# Patient Record
Sex: Female | Born: 1965
Health system: Southern US, Community
[De-identification: ages and names within clinical notes are randomized; demographics above are authoritative.]

## PROBLEM LIST (undated history)

## (undated) DIAGNOSIS — R0600 Dyspnea, unspecified: Secondary | ICD-10-CM

## (undated) DIAGNOSIS — I509 Heart failure, unspecified: Secondary | ICD-10-CM

## (undated) DIAGNOSIS — I82409 Acute embolism and thrombosis of unspecified deep veins of unspecified lower extremity: Secondary | ICD-10-CM

## (undated) DIAGNOSIS — E669 Obesity, unspecified: Secondary | ICD-10-CM

## (undated) DIAGNOSIS — D649 Anemia, unspecified: Secondary | ICD-10-CM

## (undated) DIAGNOSIS — E785 Hyperlipidemia, unspecified: Secondary | ICD-10-CM

## (undated) DIAGNOSIS — R5383 Other fatigue: Secondary | ICD-10-CM

## (undated) DIAGNOSIS — R0601 Orthopnea: Secondary | ICD-10-CM

## (undated) DIAGNOSIS — E119 Type 2 diabetes mellitus without complications: Secondary | ICD-10-CM

## (undated) DIAGNOSIS — R0609 Other forms of dyspnea: Secondary | ICD-10-CM

## (undated) DIAGNOSIS — N920 Excessive and frequent menstruation with regular cycle: Secondary | ICD-10-CM

## (undated) DIAGNOSIS — I2510 Atherosclerotic heart disease of native coronary artery without angina pectoris: Secondary | ICD-10-CM

## (undated) DIAGNOSIS — I214 Non-ST elevation (NSTEMI) myocardial infarction: Secondary | ICD-10-CM

## (undated) DIAGNOSIS — I10 Essential (primary) hypertension: Secondary | ICD-10-CM

## (undated) HISTORY — DX: Essential (primary) hypertension: I10

## (undated) HISTORY — DX: Other fatigue: R53.83

## (undated) HISTORY — DX: Other forms of dyspnea: R06.09

## (undated) HISTORY — DX: Dyspnea, unspecified: R06.00

## (undated) HISTORY — PX: CORONARY ANGIOPLASTY WITH STENT PLACEMENT: SHX49

## (undated) HISTORY — DX: Heart failure, unspecified: I50.9

## (undated) HISTORY — DX: Orthopnea: R06.01

## (undated) HISTORY — DX: Acute embolism and thrombosis of unspecified deep veins of unspecified lower extremity: I82.409

## (undated) HISTORY — PX: OVARIAN CYST REMOVAL: SHX89

---

## 2004-10-19 HISTORY — PX: TUBAL LIGATION: SHX77

## 2006-06-05 ENCOUNTER — Emergency Department (HOSPITAL_COMMUNITY): Admission: EM | Admit: 2006-06-05 | Discharge: 2006-06-06 | Payer: Self-pay | Admitting: Emergency Medicine

## 2008-05-15 ENCOUNTER — Emergency Department (HOSPITAL_COMMUNITY): Admission: EM | Admit: 2008-05-15 | Discharge: 2008-05-15 | Payer: Self-pay | Admitting: Emergency Medicine

## 2009-01-08 ENCOUNTER — Emergency Department (HOSPITAL_COMMUNITY): Admission: EM | Admit: 2009-01-08 | Discharge: 2009-01-09 | Payer: Self-pay | Admitting: Emergency Medicine

## 2009-05-31 ENCOUNTER — Emergency Department (HOSPITAL_COMMUNITY): Admission: EM | Admit: 2009-05-31 | Discharge: 2009-06-01 | Payer: Self-pay | Admitting: Emergency Medicine

## 2009-08-09 ENCOUNTER — Observation Stay (HOSPITAL_COMMUNITY): Admission: EM | Admit: 2009-08-09 | Discharge: 2009-08-11 | Payer: Self-pay | Admitting: Emergency Medicine

## 2010-06-09 ENCOUNTER — Emergency Department (HOSPITAL_COMMUNITY): Admission: EM | Admit: 2010-06-09 | Discharge: 2010-06-09 | Payer: Self-pay | Admitting: Family Medicine

## 2010-06-16 ENCOUNTER — Emergency Department (HOSPITAL_COMMUNITY): Admission: EM | Admit: 2010-06-16 | Discharge: 2010-06-17 | Payer: Self-pay | Admitting: Emergency Medicine

## 2010-08-29 ENCOUNTER — Emergency Department (HOSPITAL_COMMUNITY): Admission: EM | Admit: 2010-08-29 | Discharge: 2010-08-30 | Payer: Self-pay | Admitting: Emergency Medicine

## 2010-12-10 ENCOUNTER — Inpatient Hospital Stay (INDEPENDENT_AMBULATORY_CARE_PROVIDER_SITE_OTHER)
Admission: RE | Admit: 2010-12-10 | Discharge: 2010-12-10 | Disposition: A | Discharge status: Home / Self Care | Payer: Self-pay | Source: Ambulatory Visit | Attending: Family Medicine | Admitting: Family Medicine

## 2010-12-10 DIAGNOSIS — N3090 Cystitis, unspecified without hematuria: Secondary | ICD-10-CM

## 2010-12-10 DIAGNOSIS — I10 Essential (primary) hypertension: Secondary | ICD-10-CM

## 2010-12-10 DIAGNOSIS — R109 Unspecified abdominal pain: Secondary | ICD-10-CM

## 2010-12-10 LAB — POCT URINALYSIS DIPSTICK
Nitrite: NEGATIVE
Protein, ur: NEGATIVE mg/dL
pH: 7 (ref 5.0–8.0)

## 2010-12-11 LAB — GC/CHLAMYDIA PROBE AMP, GENITAL: Chlamydia, DNA Probe: NEGATIVE

## 2010-12-16 ENCOUNTER — Emergency Department (HOSPITAL_COMMUNITY): Payer: Self-pay

## 2010-12-16 ENCOUNTER — Emergency Department (HOSPITAL_COMMUNITY)
Admission: EM | Admit: 2010-12-16 | Discharge: 2010-12-16 | Disposition: A | Discharge status: Home / Self Care | Payer: Self-pay | Attending: Emergency Medicine | Admitting: Emergency Medicine

## 2010-12-16 DIAGNOSIS — I10 Essential (primary) hypertension: Secondary | ICD-10-CM | POA: Insufficient documentation

## 2010-12-16 DIAGNOSIS — W108XXA Fall (on) (from) other stairs and steps, initial encounter: Secondary | ICD-10-CM | POA: Insufficient documentation

## 2010-12-16 DIAGNOSIS — S7010XA Contusion of unspecified thigh, initial encounter: Secondary | ICD-10-CM | POA: Insufficient documentation

## 2010-12-16 DIAGNOSIS — E669 Obesity, unspecified: Secondary | ICD-10-CM | POA: Insufficient documentation

## 2010-12-16 DIAGNOSIS — M79609 Pain in unspecified limb: Secondary | ICD-10-CM | POA: Insufficient documentation

## 2010-12-30 LAB — DIFFERENTIAL
Basophils Relative: 0 % (ref 0–1)
Lymphs Abs: 0.9 10*3/uL (ref 0.7–4.0)
Monocytes Relative: 10 % (ref 3–12)
Neutro Abs: 3.3 10*3/uL (ref 1.7–7.7)
Neutrophils Relative %: 69 % (ref 43–77)

## 2010-12-30 LAB — POCT I-STAT, CHEM 8
Chloride: 106 mEq/L (ref 96–112)
Glucose, Bld: 167 mg/dL — ABNORMAL HIGH (ref 70–99)
HCT: 31 % — ABNORMAL LOW (ref 36.0–46.0)
Potassium: 3.7 mEq/L (ref 3.5–5.1)

## 2010-12-30 LAB — URINALYSIS, ROUTINE W REFLEX MICROSCOPIC
Nitrite: NEGATIVE
Specific Gravity, Urine: 1.011 (ref 1.005–1.030)
pH: 7 (ref 5.0–8.0)

## 2010-12-30 LAB — WET PREP, GENITAL: Clue Cells Wet Prep HPF POC: NONE SEEN

## 2010-12-30 LAB — CBC
Hemoglobin: 9.5 g/dL — ABNORMAL LOW (ref 12.0–15.0)
MCHC: 32.5 g/dL (ref 30.0–36.0)
RBC: 4.26 MIL/uL (ref 3.87–5.11)

## 2010-12-30 LAB — GC/CHLAMYDIA PROBE AMP, GENITAL: Chlamydia, DNA Probe: NEGATIVE

## 2011-01-02 LAB — POCT PREGNANCY, URINE: Preg Test, Ur: NEGATIVE

## 2011-01-02 LAB — POCT I-STAT, CHEM 8
Calcium, Ion: 1.15 mmol/L (ref 1.12–1.32)
Chloride: 107 mEq/L (ref 96–112)
HCT: 34 % — ABNORMAL LOW (ref 36.0–46.0)
Hemoglobin: 11.6 g/dL — ABNORMAL LOW (ref 12.0–15.0)
TCO2: 25 mmol/L (ref 0–100)

## 2011-01-02 LAB — POCT URINALYSIS DIPSTICK
Bilirubin Urine: NEGATIVE
Nitrite: NEGATIVE
Urobilinogen, UA: 0.2 mg/dL (ref 0.0–1.0)
pH: 6 (ref 5.0–8.0)

## 2011-01-02 LAB — URINE CULTURE: Culture  Setup Time: 201108221208

## 2011-01-22 LAB — CBC
HCT: 27.4 % — ABNORMAL LOW (ref 36.0–46.0)
HCT: 30.1 % — ABNORMAL LOW (ref 36.0–46.0)
Hemoglobin: 10 g/dL — ABNORMAL LOW (ref 12.0–15.0)
MCHC: 33.1 g/dL (ref 30.0–36.0)
MCV: 73.6 fL — ABNORMAL LOW (ref 78.0–100.0)
MCV: 73.8 fL — ABNORMAL LOW (ref 78.0–100.0)
Platelets: 230 10*3/uL (ref 150–400)
Platelets: 251 10*3/uL (ref 150–400)
RBC: 3.73 MIL/uL — ABNORMAL LOW (ref 3.87–5.11)
RBC: 4.08 MIL/uL (ref 3.87–5.11)
RDW: 15 % (ref 11.5–15.5)
RDW: 15.6 % — ABNORMAL HIGH (ref 11.5–15.5)
WBC: 12.4 10*3/uL — ABNORMAL HIGH (ref 4.0–10.5)
WBC: 6.2 10*3/uL (ref 4.0–10.5)

## 2011-01-22 LAB — COMPREHENSIVE METABOLIC PANEL
ALT: 13 U/L (ref 0–35)
AST: 19 U/L (ref 0–37)
Albumin: 3.4 g/dL — ABNORMAL LOW (ref 3.5–5.2)
Alkaline Phosphatase: 80 U/L (ref 39–117)
BUN: 12 mg/dL (ref 6–23)
BUN: 14 mg/dL (ref 6–23)
CO2: 25 mEq/L (ref 19–32)
Calcium: 8.4 mg/dL (ref 8.4–10.5)
Chloride: 101 mEq/L (ref 96–112)
Creatinine, Ser: 1.01 mg/dL (ref 0.4–1.2)
GFR calc Af Amer: >60 mL/min (ref >60)
GFR calc non Af Amer: >60 mL/min (ref >60)
Glucose, Bld: 172 mg/dL — ABNORMAL HIGH (ref 70–99)
Potassium: 4.2 mEq/L (ref 3.5–5.1)
Sodium: 135 mEq/L (ref 135–145)
Total Bilirubin: 0.5 mg/dL (ref 0.3–1.2)
Total Protein: 7 g/dL (ref 6.0–8.3)
Total Protein: 7.8 g/dL (ref 6.0–8.3)

## 2011-01-22 LAB — BASIC METABOLIC PANEL
Chloride: 102 mEq/L (ref 96–112)
GFR calc Af Amer: >60 mL/min (ref >60)
GFR calc non Af Amer: >60 mL/min (ref >60)
Potassium: 4.1 mEq/L (ref 3.5–5.1)
Sodium: 137 mEq/L (ref 135–145)

## 2011-01-22 LAB — GLUCOSE, CAPILLARY
Glucose-Capillary: 100 mg/dL — ABNORMAL HIGH (ref 70–99)
Glucose-Capillary: 111 mg/dL — ABNORMAL HIGH (ref 70–99)
Glucose-Capillary: 120 mg/dL — ABNORMAL HIGH (ref 70–99)
Glucose-Capillary: 127 mg/dL — ABNORMAL HIGH (ref 70–99)
Glucose-Capillary: 138 mg/dL — ABNORMAL HIGH (ref 70–99)
Glucose-Capillary: 149 mg/dL — ABNORMAL HIGH (ref 70–99)
Glucose-Capillary: 169 mg/dL — ABNORMAL HIGH (ref 70–99)
Glucose-Capillary: 187 mg/dL — ABNORMAL HIGH (ref 70–99)

## 2011-01-22 LAB — CK TOTAL AND CKMB (NOT AT ARMC)
CK, MB: 0.6 ng/mL (ref 0.3–4.0)
Relative Index: INVALID (ref 0.0–2.5)
Total CK: 61 U/L (ref 7–177)

## 2011-01-22 LAB — URINE CULTURE

## 2011-01-22 LAB — URINALYSIS, ROUTINE W REFLEX MICROSCOPIC
Nitrite: NEGATIVE
Specific Gravity, Urine: 1.012 (ref 1.005–1.030)
Urobilinogen, UA: 0.2 mg/dL (ref 0.0–1.0)
pH: 6 (ref 5.0–8.0)

## 2011-01-22 LAB — TROPONIN I: Troponin I: 0.01 ng/mL (ref 0.00–0.06)

## 2011-01-22 LAB — LIPID PANEL
Cholesterol: 136 mg/dL (ref 0–200)
HDL: 39 mg/dL — ABNORMAL LOW (ref >39)
LDL Cholesterol: 91 mg/dL (ref 0–99)
Triglycerides: 30 mg/dL (ref <150)

## 2011-01-22 LAB — CARDIAC PANEL(CRET KIN+CKTOT+MB+TROPI): Relative Index: INVALID (ref 0.0–2.5)

## 2011-01-22 LAB — CULTURE, BLOOD (ROUTINE X 2): Culture: NO GROWTH

## 2011-01-22 LAB — TSH: TSH: 0.4 u[IU]/mL (ref 0.350–4.500)

## 2011-01-22 LAB — URINE MICROSCOPIC-ADD ON

## 2011-01-22 LAB — DIFFERENTIAL
Basophils Absolute: 0 10*3/uL (ref 0.0–0.1)
Eosinophils Relative: 0 % (ref 0–5)
Lymphocytes Relative: 3 % — ABNORMAL LOW (ref 12–46)
Monocytes Absolute: 0.4 10*3/uL (ref 0.1–1.0)
Monocytes Relative: 3 % (ref 3–12)
Neutro Abs: 11.6 10*3/uL — ABNORMAL HIGH (ref 1.7–7.7)

## 2011-01-29 LAB — CBC
HCT: 29.3 % — ABNORMAL LOW (ref 36.0–46.0)
Hemoglobin: 9.6 g/dL — ABNORMAL LOW (ref 12.0–15.0)
MCHC: 32.7 g/dL (ref 30.0–36.0)
RBC: 4.02 MIL/uL (ref 3.87–5.11)
RDW: 16.1 % — ABNORMAL HIGH (ref 11.5–15.5)

## 2011-01-29 LAB — APTT: aPTT: 33 seconds (ref 24–37)

## 2011-01-29 LAB — DIFFERENTIAL
Basophils Absolute: 0 10*3/uL (ref 0.0–0.1)
Eosinophils Relative: 2 % (ref 0–5)
Lymphocytes Relative: 17 % (ref 12–46)
Monocytes Absolute: 0.3 10*3/uL (ref 0.1–1.0)
Monocytes Relative: 6 % (ref 3–12)
Neutro Abs: 4.1 10*3/uL (ref 1.7–7.7)

## 2011-04-19 DIAGNOSIS — I509 Heart failure, unspecified: Secondary | ICD-10-CM | POA: Diagnosis present

## 2011-04-19 HISTORY — DX: Heart failure, unspecified: I50.9

## 2011-05-02 DIAGNOSIS — I5041 Acute combined systolic (congestive) and diastolic (congestive) heart failure: Secondary | ICD-10-CM

## 2011-05-19 ENCOUNTER — Inpatient Hospital Stay (HOSPITAL_COMMUNITY)
Admission: EM | Admit: 2011-05-19 | Discharge: 2011-05-20 | Disposition: A | Discharge status: Home / Self Care | Payer: Medicaid Other | Source: Home / Self Care | Attending: Cardiology | Admitting: Cardiology

## 2011-05-19 ENCOUNTER — Emergency Department (HOSPITAL_COMMUNITY): Payer: Medicaid Other

## 2011-05-19 DIAGNOSIS — I110 Hypertensive heart disease with heart failure: Principal | ICD-10-CM | POA: Diagnosis present

## 2011-05-19 DIAGNOSIS — N920 Excessive and frequent menstruation with regular cycle: Secondary | ICD-10-CM | POA: Diagnosis present

## 2011-05-19 DIAGNOSIS — F329 Major depressive disorder, single episode, unspecified: Secondary | ICD-10-CM | POA: Diagnosis present

## 2011-05-19 DIAGNOSIS — D649 Anemia, unspecified: Secondary | ICD-10-CM | POA: Diagnosis present

## 2011-05-19 DIAGNOSIS — F3289 Other specified depressive episodes: Secondary | ICD-10-CM | POA: Diagnosis present

## 2011-05-19 DIAGNOSIS — Z7982 Long term (current) use of aspirin: Secondary | ICD-10-CM

## 2011-05-19 DIAGNOSIS — D259 Leiomyoma of uterus, unspecified: Secondary | ICD-10-CM | POA: Diagnosis present

## 2011-05-19 DIAGNOSIS — I5043 Acute on chronic combined systolic (congestive) and diastolic (congestive) heart failure: Secondary | ICD-10-CM | POA: Diagnosis present

## 2011-05-19 DIAGNOSIS — I509 Heart failure, unspecified: Secondary | ICD-10-CM | POA: Diagnosis present

## 2011-05-19 DIAGNOSIS — K219 Gastro-esophageal reflux disease without esophagitis: Secondary | ICD-10-CM | POA: Diagnosis present

## 2011-05-19 DIAGNOSIS — R7309 Other abnormal glucose: Secondary | ICD-10-CM | POA: Diagnosis present

## 2011-05-19 LAB — BASIC METABOLIC PANEL
Chloride: 105 mEq/L (ref 96–112)
GFR calc Af Amer: >60 mL/min (ref >60)
GFR calc non Af Amer: >60 mL/min (ref >60)
Potassium: 3.9 mEq/L (ref 3.5–5.1)
Sodium: 138 mEq/L (ref 135–145)

## 2011-05-19 LAB — DIFFERENTIAL
Basophils Absolute: 0 10*3/uL (ref 0.0–0.1)
Eosinophils Relative: 1 % (ref 0–5)
Lymphocytes Relative: 22 % (ref 12–46)
Monocytes Relative: 7 % (ref 3–12)
Neutrophils Relative %: 69 % (ref 43–77)

## 2011-05-19 LAB — HEMOGLOBIN A1C: Mean Plasma Glucose: 140 mg/dL — ABNORMAL HIGH (ref <117)

## 2011-05-19 LAB — CBC
Hemoglobin: 9.2 g/dL — ABNORMAL LOW (ref 12.0–15.0)
MCH: 21.9 pg — ABNORMAL LOW (ref 26.0–34.0)
Platelets: 308 10*3/uL (ref 150–400)
RBC: 4.21 MIL/uL (ref 3.87–5.11)
WBC: 4.3 10*3/uL (ref 4.0–10.5)

## 2011-05-19 LAB — TROPONIN I: Troponin I: <0.3 ng/mL (ref <0.30)

## 2011-05-19 LAB — CARDIAC PANEL(CRET KIN+CKTOT+MB+TROPI)
CK, MB: 3.6 ng/mL (ref 0.3–4.0)
Relative Index: 2.8 — ABNORMAL HIGH (ref 0.0–2.5)
Relative Index: 2.6 — ABNORMAL HIGH (ref 0.0–2.5)
Total CK: 141 U/L (ref 7–177)
Total CK: 120 U/L (ref 7–177)

## 2011-05-19 LAB — LIPID PANEL
Cholesterol: 165 mg/dL (ref 0–200)
LDL Cholesterol: 109 mg/dL — ABNORMAL HIGH (ref 0–99)
Triglycerides: 65 mg/dL (ref <150)

## 2011-05-19 LAB — GLUCOSE, CAPILLARY: Glucose-Capillary: 136 mg/dL — ABNORMAL HIGH (ref 70–99)

## 2011-05-20 ENCOUNTER — Inpatient Hospital Stay (HOSPITAL_COMMUNITY)
Admission: AD | Admit: 2011-05-20 | Discharge: 2011-05-21 | DRG: 293 | Disposition: A | Discharge status: Home / Self Care | Payer: Medicaid Other | Source: Other Acute Inpatient Hospital | Attending: Cardiology | Admitting: Cardiology

## 2011-05-20 LAB — CBC
HCT: 28.2 % — ABNORMAL LOW (ref 36.0–46.0)
Hemoglobin: 9.2 g/dL — ABNORMAL LOW (ref 12.0–15.0)
MCHC: 32.6 g/dL (ref 30.0–36.0)
RBC: 4.27 MIL/uL (ref 3.87–5.11)
WBC: 5.6 10*3/uL (ref 4.0–10.5)

## 2011-05-20 LAB — LIPID PANEL
Cholesterol: 168 mg/dL (ref 0–200)
HDL: 42 mg/dL (ref >39)
Triglycerides: 71 mg/dL (ref <150)

## 2011-05-20 LAB — TSH: TSH: 0.592 u[IU]/mL (ref 0.350–4.500)

## 2011-05-20 LAB — IRON: Iron: 31 ug/dL — ABNORMAL LOW (ref 42–135)

## 2011-05-20 LAB — VITAMIN B12: Vitamin B-12: 431 pg/mL (ref 211–911)

## 2011-05-20 LAB — CARDIAC PANEL(CRET KIN+CKTOT+MB+TROPI)
CK, MB: 2.7 ng/mL (ref 0.3–4.0)
Relative Index: 2.6 — ABNORMAL HIGH (ref 0.0–2.5)
Troponin I: <0.3 ng/mL (ref <0.30)

## 2011-05-20 LAB — FOLATE: Folate: 17.4 ng/mL

## 2011-05-20 LAB — BASIC METABOLIC PANEL
GFR calc Af Amer: >60 mL/min (ref >60)
GFR calc non Af Amer: >60 mL/min (ref >60)
Potassium: 4.3 mEq/L (ref 3.5–5.1)
Sodium: 137 mEq/L (ref 135–145)

## 2011-05-20 LAB — HEMOGLOBIN A1C: Mean Plasma Glucose: 146 mg/dL — ABNORMAL HIGH (ref <117)

## 2011-05-21 LAB — BASIC METABOLIC PANEL
BUN: 16 mg/dL (ref 6–23)
Creatinine, Ser: 0.9 mg/dL (ref 0.50–1.10)
GFR calc Af Amer: >60 mL/min (ref >60)
GFR calc non Af Amer: >60 mL/min (ref >60)
Glucose, Bld: 114 mg/dL — ABNORMAL HIGH (ref 70–99)

## 2011-06-03 NOTE — H&P (Signed)
[name], [name]              ACCOUNT NO.:  [id number]  MEDICAL RECORD NO.:  [id number]  LOCATION:  1421                         FACILITY:  [facility]  PHYSICIAN:  Pamella Pert, MD DATE OF BIRTH:  03-13-1966  DATE OF ADMISSION:  05/19/2011 DATE OF DISCHARGE:  05/20/2011                             HISTORY & PHYSICAL   CHIEF COMPLAINT:  Shortness of breath for the last 3 days.  Ms. [name] is a 45 year old female with history of hypertension. She had quit taking all her medications since 2008, has last seen Dr. Alwyn Pea greater than a year ago, has last seen her gynecologist greater than a year ago, presented to the emergency department complaining of shortness breath ongoing for last 3 days.  She stated that Friday, she felt slightly short of breath.  Saturday in the evening, when she was trying to sleep, she felt short of breath and had to sit up, but was able to lie back down, but on Sunday, she pretty much sat all night.  Finally on Monday, that is yesterday, she presented to the hospital, complaining of worsening shortness of breath and inability to lay down.  She was evaluated by emergency department.  At that time, she was found to be in pulmonary edema.  She was diuresed and she was admitted to the hospital for further evaluation.  The patient has started to feel better, but still is short of breath and is unable to lay down.  She states that when she was having shortness of breath, she had some chest tightness, that was ongoing only when she lies down, but would improve if she sat up.  Otherwise, no prior chest discomfort, no associated symptoms with shortness of breath.  She denies any palpitations.  Denies any syncope or near syncopal spells or dizziness prior to this onset.  No history of any recent flu-like illnesses.  No history to suggest any hemoptysis or syncope.  REVIEW OF SYSTEMS:  She has had a history of gestational diabetes in 2005.  She  was told she was hypertensive.  She has recently been trying to lose weight and had joined exercise program for the last 6 weeks and exercising 3 days per week.  She denies any bowel or bladder disturbances.  Denies any dark stools.  She has had excessive bleeding during her menstrual cycles and has been told that she has fibroids, in the past.  She has no neurological deficits.  No seizure disorder. Other systems are negative.  HOME MEDICATIONS:  Included, aspirin on a p.r.n. basis, flaxseed capsules.  ALLERGIES:  No known drug allergies.  PAST MEDICAL HISTORY:  Significant for hypertension, had stopped taking medication some time in 2007 and 2008.  Has history of gestational diabetes in 2005.  No medical followup for greater than a year.  PAST SURGICAL HISTORY:  Has had left ovarian cyst removed in 2000, tubal ligation in 2006.  FAMILY HISTORY:  Father died with a stroke at the age of 54.  Mother alive at the age of 50 and healthy, has eight siblings with no history of stroke or coronary artery disease.  The older sister being 4, younger sister being 32 years of  age.  SOCIAL HISTORY:  She does not drink alcohol, does not use any tobacco products.  She is single, works as a Neurosurgeon.  PHYSICAL EXAM:  GENERAL:  She is moderately built and appears to be morbidly obese.  She appears to be in no acute distress. VITAL SIGNS:  Include, temperature of 98.7, pulse is 92 beats per minute, regular, respirations 16, blood pressure 156/102 mmHg. CARDIAC:  S1-S2 was normal with a S3 gallop present.  No murmur appreciated. CHEST:  Examination revealed bilateral scattered rhonchi and basilar crackles. ABDOMEN:  Obese with pannus, no obvious organomegaly.  Mild hypogastric tenderness present.  Bowel sounds heard in all fours quadrants. EXTREMITY:  Revealed full range of movements without any edema. Vascular examination was normal. NEUROLOGICAL:  She is intact without any gross focal  deficits.  Her EKG demonstrates sinus tachycardia at the rate of 106 beats per minute with nonspecific T-wave changes.  No acute changes were evident for ischemia.  LABORATORY DATA:  Her total cholesterol was 165, triglycerides 65, HDL 43, LDL of 109.  Her hemoglobin A1c was elevated at 6.5.  CBC revealed a hemoglobin of 9.2 with a hematocrit of 28.1.  White count of 4.3 with a platelet count of 308000.  MCV was decreased at 66.7, MCH was also decreased at 21.8 with increased RDW.  Her electrolytes, BUN and creatinine are within normal limits.  BNP was elevated at greater than 1500.  Echocardiogram done this afternoon revealed ejection fraction of 15-20% with global hypokinesis, without any significant valvular abnormalities.  IMPRESSION: 1. Acute systolic and diastolic heart failure, suspect burned-out     hypertension with hypertensive heart disease. 2. Pulmonary edema. 3. Chest pain, probably related to gastroesophageal reflux disease.     No exertional component to this, cardiac biomarkers were negative     for myocardial injury. 4. Obesity is morbid. 5. Hyperglycemia, without a diagnosis of diabetes. 6. Anemia.  I suspect she probably has menorrhagia from fibroid     uterus, has been told this in the past.  RECOMMENDATIONS:  Will continue present medical regimen.  We will add Coreg at 6.25 mg p.o. b.i.d.  In spite of her present medications, her blood pressure is not well controlled.  We will stop her IV nitroglycerin, place her on digoxin along with loading and also start the patient on BiDil one p.o. t.i.d.  I will transfer her to [facility] for further management of her heart failure and she probably will need a right and left heart catheterization once pulmonary edema is resolved.  I spent greater than 45 minutes apart from evaluation to discuss regarding dietary changes that needs to be done and discussed regarding fluid and salt  restrictions.     Pamella Pert, MD     JRG/MEDQ  D:  05/19/2011  T:  05/20/2011  Job:  [id number]  cc:   Tanya D. Daphine Deutscher, M.D. Fax: [phone]  Electronically Signed by Yates Decamp MD on 06/03/2011 06:29:49 PM

## 2011-06-03 NOTE — Discharge Summary (Signed)
[name], [name]              ACCOUNT NO.:  [id number]  MEDICAL RECORD NO.:  [id number]  LOCATION:                                 FACILITY:  PHYSICIAN:  Pamella Pert, MD DATE OF BIRTH:  02/18/1966  DATE OF ADMISSION:  05/19/2011 DATE OF DISCHARGE:  05/21/2011                              DISCHARGE SUMMARY   DISCHARGE DIAGNOSES: 1. Hypertension with hypertensive heart disease with congestive heart     failure. 2. Acute systolic and diastolic heart failure. 3. Morbid obesity. 4. Anemia probably secondary to menorrhagia from uterine fibroids. 5. Acute pulmonary edema secondary to hypertensive heart disease. 6. Hyperglycemia without diagnosis of diabetes.  DISCHARGE MEDICATIONS:  Include: 1. Coreg 6.25 mg p.o. b.i.d. 2. Digoxin 0.25 mg p.o. daily. 3. Cardizem CD 240 mg p.o. daily. 4. Hydrochlorothiazide 25 mg p.o. q.a.m. 5. BiDil 20/37.5 one p.o. t.i.d. 6. Lisinopril 20 mg p.o. daily. 7. Aldactone 25 mg p.o. daily. 8. Aspirin 81 mg p.o. daily. 9. Flaxseed oil over-the-counter one tablet p.o. daily.  BRIEF HISTORY:  [name] is a 45 year old female with a history of hypertension, who has not been taking any medications, had not had a follow-up with her physicians.  She has also been told to have mild uterine fibroids, has had menorrhagia, again has not seen a physician in more than a year to two.  She was admitted to [facility] with complaints of acute onset of shortness of breath and was admitted with diagnosis of congestive heart failure and pulmonary edema.  Initially on admission, her blood pressure was 177/138 mmHg.  She had presented with severe orthopnea, paroxysmal nocturnal dyspnea, and shortness of breath that was fairly acute onset for the last 3-4 days prior to admission.  Initially on admission, her cardiac markers were negative for myocardial injury.  Her pro BNP was 1570.  CBC revealed a hemoglobin of 9.2, hematocrit of  28.1, white count was 4.2, with a platelet count of 308,000.  Her lipid profile revealed total cholesterol 165, triglycerides 65, HDL 43, LDL 109.  Thyroid-stimulating hormone was normal at 0.820.  Hemoglobin A1c was elevated at 6.5.  Her chest x-ray revealed mild cardiomegaly with pulmonary vascular congestion.  An echocardiogram that was done on May 19, 2011, revealed ejection the fraction to be 15%-20% with diffuse hypokinesis.  She had grade 2 diastolic dysfunction.  Left atrium was mildly dilated.  There is no significant valvular abnormalities.  There was moderate concentric left ventricular hypertrophy.  After admission to the hospital, she was diuresed and felt well.  On the day of discharge, her BMP revealed sodium of 139, potassium of 4.0, chloride of 102, and bicarbonate was 26 with a BUN of 16, creatinine of 0.90.  Blood sugar was again elevated at 114.  Her pro BNP was 145.1.  The patient had felt better.  She had no shortness of breath, no PND or orthopnea on the day of discharge.  She was also found to be depressed about the whole situation.  Hence, behavioral health has been contacted to give their opinion regarding her.  There was no suicidal ideation.  The patient will be followed up in my office in  2 weeks.  I have extensively discussed with her regarding dietary changes, fluid restriction, salt restriction.  Eventually, she will need probably a cardiac catheterization that includes left and right heart catheterization on an elective basis to exclude coronary artery disease as an etiology for her cardiomyopathy.  FINAL RECOMMENDATIONS:  The patient will need continued follow-up of hemoglobin A1c and hyperglycemia without the diagnosis of diabetes. Weight loss is indicated.  She will also need to follow up on congestive heart failure.  PHYSICAL EXAMINATION:  VITAL SIGNS:  On the day of discharge, her vital signs were stable with temperature of 97.6, pulse is  72 beats per minute, respirations 14, blood pressure 146/71 mmHg. ABDOMEN:  Soft, obese, nontender, without any edema or organomegaly. EXTREMITIES:  With full range of movements without any edema. CHEST:  Clear to auscultate. CARDIAC:  Revealed no gallop that was evident previously.  DISCHARGE RECOMMENDATIONS:  She will also need to follow up with her OB/GYN regarding menorrhagia and anemia.     Pamella Pert, MD     JRG/MEDQ  D:  05/21/2011  T:  05/21/2011  Job:  [id number]  Electronically Signed by Yates Decamp MD on 06/03/2011 06:29:38 PM

## 2011-06-05 ENCOUNTER — Emergency Department (HOSPITAL_COMMUNITY): Payer: Self-pay

## 2011-06-05 ENCOUNTER — Emergency Department (HOSPITAL_COMMUNITY)
Admission: EM | Admit: 2011-06-05 | Discharge: 2011-06-06 | Disposition: A | Discharge status: Home / Self Care | Payer: Self-pay | Attending: Emergency Medicine | Admitting: Emergency Medicine

## 2011-06-05 DIAGNOSIS — R059 Cough, unspecified: Secondary | ICD-10-CM | POA: Insufficient documentation

## 2011-06-05 DIAGNOSIS — R0602 Shortness of breath: Secondary | ICD-10-CM | POA: Insufficient documentation

## 2011-06-05 DIAGNOSIS — R0789 Other chest pain: Secondary | ICD-10-CM | POA: Insufficient documentation

## 2011-06-05 DIAGNOSIS — J40 Bronchitis, not specified as acute or chronic: Secondary | ICD-10-CM | POA: Insufficient documentation

## 2011-06-05 DIAGNOSIS — I509 Heart failure, unspecified: Secondary | ICD-10-CM | POA: Insufficient documentation

## 2011-06-05 DIAGNOSIS — I10 Essential (primary) hypertension: Secondary | ICD-10-CM | POA: Insufficient documentation

## 2011-06-05 DIAGNOSIS — R05 Cough: Secondary | ICD-10-CM | POA: Insufficient documentation

## 2011-06-05 LAB — POCT I-STAT, CHEM 8
Calcium, Ion: 1.12 mmol/L (ref 1.12–1.32)
Chloride: 103 mEq/L (ref 96–112)
Glucose, Bld: 108 mg/dL — ABNORMAL HIGH (ref 70–99)
HCT: 33 % — ABNORMAL LOW (ref 36.0–46.0)
Hemoglobin: 11.2 g/dL — ABNORMAL LOW (ref 12.0–15.0)

## 2011-06-05 LAB — URINALYSIS, ROUTINE W REFLEX MICROSCOPIC
Bilirubin Urine: NEGATIVE
Glucose, UA: NEGATIVE mg/dL
Ketones, ur: NEGATIVE mg/dL
Protein, ur: NEGATIVE mg/dL
pH: 6.5 (ref 5.0–8.0)

## 2011-06-05 LAB — CBC
HCT: 29.8 % — ABNORMAL LOW (ref 36.0–46.0)
MCHC: 33.6 g/dL (ref 30.0–36.0)
MCV: 66.2 fL — ABNORMAL LOW (ref 78.0–100.0)
Platelets: 264 10*3/uL (ref 150–400)
RDW: 16.7 % — ABNORMAL HIGH (ref 11.5–15.5)
WBC: 3.3 10*3/uL — ABNORMAL LOW (ref 4.0–10.5)

## 2011-06-05 LAB — URINE MICROSCOPIC-ADD ON

## 2011-06-05 LAB — POCT I-STAT TROPONIN I: Troponin i, poc: 0 ng/mL (ref 0.00–0.08)

## 2011-06-05 LAB — DIFFERENTIAL
Basophils Absolute: 0 10*3/uL (ref 0.0–0.1)
Eosinophils Absolute: 0.1 10*3/uL (ref 0.0–0.7)
Eosinophils Relative: 2 % (ref 0–5)
Lymphs Abs: 0.4 10*3/uL — ABNORMAL LOW (ref 0.7–4.0)
Monocytes Absolute: 0.2 10*3/uL (ref 0.1–1.0)

## 2011-06-20 DIAGNOSIS — I82409 Acute embolism and thrombosis of unspecified deep veins of unspecified lower extremity: Secondary | ICD-10-CM

## 2011-06-20 HISTORY — DX: Acute embolism and thrombosis of unspecified deep veins of unspecified lower extremity: I82.409

## 2011-07-10 ENCOUNTER — Emergency Department (HOSPITAL_COMMUNITY): Payer: Self-pay

## 2011-07-10 ENCOUNTER — Emergency Department (HOSPITAL_COMMUNITY)
Admission: EM | Admit: 2011-07-10 | Discharge: 2011-07-10 | Disposition: A | Discharge status: Home / Self Care | Payer: Self-pay | Attending: Emergency Medicine | Admitting: Emergency Medicine

## 2011-07-10 DIAGNOSIS — I509 Heart failure, unspecified: Secondary | ICD-10-CM | POA: Insufficient documentation

## 2011-07-10 DIAGNOSIS — R0602 Shortness of breath: Secondary | ICD-10-CM | POA: Insufficient documentation

## 2011-07-10 DIAGNOSIS — I10 Essential (primary) hypertension: Secondary | ICD-10-CM | POA: Insufficient documentation

## 2011-07-10 DIAGNOSIS — R071 Chest pain on breathing: Secondary | ICD-10-CM | POA: Insufficient documentation

## 2011-07-10 LAB — CBC
MCV: 67.7 fL — ABNORMAL LOW (ref 78.0–100.0)
Platelets: 352 10*3/uL (ref 150–400)
RBC: 4.05 MIL/uL (ref 3.87–5.11)
RDW: 17.2 % — ABNORMAL HIGH (ref 11.5–15.5)
WBC: 6 10*3/uL (ref 4.0–10.5)

## 2011-07-10 LAB — COMPREHENSIVE METABOLIC PANEL
AST: 13 U/L (ref 0–37)
CO2: 24 mEq/L (ref 19–32)
Calcium: 9.6 mg/dL (ref 8.4–10.5)
Creatinine, Ser: 1.14 mg/dL — ABNORMAL HIGH (ref 0.50–1.10)
GFR calc Af Amer: >60 mL/min (ref >60)
GFR calc non Af Amer: 52 mL/min — ABNORMAL LOW (ref >60)
Glucose, Bld: 138 mg/dL — ABNORMAL HIGH (ref 70–99)
Total Protein: 7.6 g/dL (ref 6.0–8.3)

## 2011-07-10 LAB — DIFFERENTIAL
Basophils Absolute: 0.1 10*3/uL (ref 0.0–0.1)
Eosinophils Absolute: 0.1 10*3/uL (ref 0.0–0.7)
Lymphocytes Relative: 24 % (ref 12–46)
Monocytes Relative: 8 % (ref 3–12)
Neutro Abs: 3.9 10*3/uL (ref 1.7–7.7)
Neutrophils Relative %: 65 % (ref 43–77)

## 2011-07-10 LAB — DIGOXIN LEVEL: Digoxin Level: 1.7 ng/mL (ref 0.8–2.0)

## 2011-07-10 LAB — PROTIME-INR: Prothrombin Time: 13.7 seconds (ref 11.6–15.2)

## 2011-07-10 LAB — POCT I-STAT TROPONIN I

## 2011-07-16 ENCOUNTER — Emergency Department (HOSPITAL_COMMUNITY): Payer: Medicaid Other

## 2011-07-16 ENCOUNTER — Inpatient Hospital Stay (HOSPITAL_COMMUNITY)
Admission: EM | Admit: 2011-07-16 | Discharge: 2011-07-23 | DRG: 641 | Disposition: A | Discharge status: Home / Self Care | Payer: Medicaid Other | Attending: Internal Medicine | Admitting: Internal Medicine

## 2011-07-16 DIAGNOSIS — R071 Chest pain on breathing: Secondary | ICD-10-CM | POA: Diagnosis present

## 2011-07-16 DIAGNOSIS — Z79899 Other long term (current) drug therapy: Secondary | ICD-10-CM

## 2011-07-16 DIAGNOSIS — Z23 Encounter for immunization: Secondary | ICD-10-CM

## 2011-07-16 DIAGNOSIS — Z7901 Long term (current) use of anticoagulants: Secondary | ICD-10-CM

## 2011-07-16 DIAGNOSIS — N179 Acute kidney failure, unspecified: Secondary | ICD-10-CM | POA: Diagnosis not present

## 2011-07-16 DIAGNOSIS — I428 Other cardiomyopathies: Secondary | ICD-10-CM | POA: Diagnosis present

## 2011-07-16 DIAGNOSIS — I498 Other specified cardiac arrhythmias: Secondary | ICD-10-CM | POA: Diagnosis present

## 2011-07-16 DIAGNOSIS — D649 Anemia, unspecified: Secondary | ICD-10-CM | POA: Diagnosis present

## 2011-07-16 DIAGNOSIS — K219 Gastro-esophageal reflux disease without esophagitis: Secondary | ICD-10-CM | POA: Diagnosis present

## 2011-07-16 DIAGNOSIS — I509 Heart failure, unspecified: Secondary | ICD-10-CM | POA: Diagnosis present

## 2011-07-16 DIAGNOSIS — I129 Hypertensive chronic kidney disease with stage 1 through stage 4 chronic kidney disease, or unspecified chronic kidney disease: Secondary | ICD-10-CM | POA: Diagnosis present

## 2011-07-16 DIAGNOSIS — N181 Chronic kidney disease, stage 1: Secondary | ICD-10-CM | POA: Diagnosis present

## 2011-07-16 DIAGNOSIS — E875 Hyperkalemia: Principal | ICD-10-CM | POA: Diagnosis present

## 2011-07-16 DIAGNOSIS — E669 Obesity, unspecified: Secondary | ICD-10-CM | POA: Diagnosis present

## 2011-07-16 DIAGNOSIS — I82409 Acute embolism and thrombosis of unspecified deep veins of unspecified lower extremity: Secondary | ICD-10-CM | POA: Diagnosis present

## 2011-07-16 DIAGNOSIS — I5022 Chronic systolic (congestive) heart failure: Secondary | ICD-10-CM | POA: Diagnosis present

## 2011-07-16 DIAGNOSIS — Z7982 Long term (current) use of aspirin: Secondary | ICD-10-CM

## 2011-07-16 LAB — POCT I-STAT, CHEM 8
Chloride: 108 mEq/L (ref 96–112)
Glucose, Bld: 115 mg/dL — ABNORMAL HIGH (ref 70–99)
Hemoglobin: 10.2 g/dL — ABNORMAL LOW (ref 12.0–15.0)
Potassium: 7.2 mEq/L (ref 3.5–5.1)
Sodium: 135 mEq/L (ref 135–145)
TCO2: 23 mmol/L (ref 0–100)

## 2011-07-16 LAB — COMPREHENSIVE METABOLIC PANEL
ALT: 14 U/L (ref 0–35)
AST: 22 U/L (ref 0–37)
Alkaline Phosphatase: 94 U/L (ref 39–117)
Alkaline Phosphatase: 86 U/L (ref 39–117)
BUN: 24 mg/dL — ABNORMAL HIGH (ref 6–23)
BUN: 24 mg/dL — ABNORMAL HIGH (ref 6–23)
CO2: 21 mEq/L (ref 19–32)
CO2: 23 mEq/L (ref 19–32)
Chloride: 103 mEq/L (ref 96–112)
Creatinine, Ser: 1.33 mg/dL — ABNORMAL HIGH (ref 0.50–1.10)
GFR calc Af Amer: 52 mL/min — ABNORMAL LOW (ref >60)
GFR calc non Af Amer: 44 mL/min — ABNORMAL LOW (ref >60)
GFR calc non Af Amer: 43 mL/min — ABNORMAL LOW (ref >60)
Glucose, Bld: 103 mg/dL — ABNORMAL HIGH (ref 70–99)
Potassium: >7.5 mEq/L (ref 3.5–5.1)
Potassium: 7.2 mEq/L (ref 3.5–5.1)
Sodium: 135 mEq/L (ref 135–145)
Total Bilirubin: 0.2 mg/dL — ABNORMAL LOW (ref 0.3–1.2)
Total Protein: 7.4 g/dL (ref 6.0–8.3)

## 2011-07-16 LAB — DIFFERENTIAL
Eosinophils Absolute: 0.1 10*3/uL (ref 0.0–0.7)
Lymphocytes Relative: 16 % (ref 12–46)
Monocytes Absolute: 0.6 10*3/uL (ref 0.1–1.0)
Neutrophils Relative %: 74 % (ref 43–77)

## 2011-07-16 LAB — CBC
Platelets: 303 10*3/uL (ref 150–400)
RDW: 17.5 % — ABNORMAL HIGH (ref 11.5–15.5)
WBC: 6.9 10*3/uL (ref 4.0–10.5)

## 2011-07-16 LAB — BASIC METABOLIC PANEL
CO2: 25 mEq/L (ref 19–32)
Calcium: 9.7 mg/dL (ref 8.4–10.5)
Creatinine, Ser: 1.37 mg/dL — ABNORMAL HIGH (ref 0.50–1.10)
GFR calc Af Amer: 51 mL/min — ABNORMAL LOW (ref >60)
GFR calc non Af Amer: 42 mL/min — ABNORMAL LOW (ref >60)
Sodium: 137 mEq/L (ref 135–145)

## 2011-07-16 LAB — CK TOTAL AND CKMB (NOT AT ARMC): Total CK: 55 U/L (ref 7–177)

## 2011-07-16 LAB — DIGOXIN LEVEL: Digoxin Level: 1.3 ng/mL (ref 0.8–2.0)

## 2011-07-17 ENCOUNTER — Inpatient Hospital Stay (HOSPITAL_COMMUNITY): Payer: Medicaid Other

## 2011-07-17 DIAGNOSIS — R0602 Shortness of breath: Secondary | ICD-10-CM

## 2011-07-17 LAB — WET PREP, GENITAL
Clue Cells Wet Prep HPF POC: NONE SEEN
Trich, Wet Prep: NONE SEEN

## 2011-07-17 LAB — BASIC METABOLIC PANEL
BUN: 20 mg/dL (ref 6–23)
BUN: 21 mg/dL (ref 6–23)
CO2: 24 mEq/L (ref 19–32)
CO2: 26 mEq/L (ref 19–32)
CO2: 27 mEq/L (ref 19–32)
Calcium: 9.9 mg/dL (ref 8.4–10.5)
Calcium: 9.4 mg/dL (ref 8.4–10.5)
Calcium: 9.5 mg/dL (ref 8.4–10.5)
Calcium: 9.2 mg/dL (ref 8.4–10.5)
Calcium: 9.3 mg/dL (ref 8.4–10.5)
Chloride: 105 mEq/L (ref 96–112)
Chloride: 102 mEq/L (ref 96–112)
Creatinine, Ser: 1.31 mg/dL — ABNORMAL HIGH (ref 0.50–1.10)
GFR calc Af Amer: 51 mL/min — ABNORMAL LOW (ref >60)
GFR calc Af Amer: 52 mL/min — ABNORMAL LOW (ref >60)
GFR calc Af Amer: 54 mL/min — ABNORMAL LOW (ref >60)
GFR calc Af Amer: 54 mL/min — ABNORMAL LOW (ref >60)
GFR calc non Af Amer: 42 mL/min — ABNORMAL LOW (ref >60)
GFR calc non Af Amer: 50 mL/min — ABNORMAL LOW (ref >60)
GFR calc non Af Amer: 44 mL/min — ABNORMAL LOW (ref >60)
GFR calc non Af Amer: 44 mL/min — ABNORMAL LOW (ref >60)
Glucose, Bld: 149 mg/dL — ABNORMAL HIGH (ref 70–99)
Glucose, Bld: 139 mg/dL — ABNORMAL HIGH (ref 70–99)
Potassium: 6 mEq/L — ABNORMAL HIGH (ref 3.5–5.1)
Potassium: 6.2 mEq/L — ABNORMAL HIGH (ref 3.5–5.1)
Potassium: 5.1 mEq/L (ref 3.5–5.1)
Potassium: 4.9 mEq/L (ref 3.5–5.1)
Sodium: 137 mEq/L (ref 135–145)
Sodium: 137 mEq/L (ref 135–145)
Sodium: 135 mEq/L (ref 135–145)
Sodium: 136 mEq/L (ref 135–145)

## 2011-07-17 LAB — URINE CULTURE: Colony Count: >=100000

## 2011-07-17 LAB — POCT URINALYSIS DIP (DEVICE)
Bilirubin Urine: NEGATIVE
Glucose, UA: NEGATIVE
Operator id: 239701
Specific Gravity, Urine: 1.015
Urobilinogen, UA: 0.2

## 2011-07-17 LAB — CARDIAC PANEL(CRET KIN+CKTOT+MB+TROPI)
CK, MB: 1.5 ng/mL (ref 0.3–4.0)
Total CK: 49 U/L (ref 7–177)
Total CK: 50 U/L (ref 7–177)
Troponin I: <0.3 ng/mL (ref <0.30)

## 2011-07-17 LAB — GC/CHLAMYDIA PROBE AMP, GENITAL
Chlamydia, DNA Probe: NEGATIVE
GC Probe Amp, Genital: NEGATIVE

## 2011-07-17 LAB — D-DIMER, QUANTITATIVE: D-Dimer, Quant: 1.14 ug/mL-FEU — ABNORMAL HIGH (ref 0.00–0.48)

## 2011-07-17 MED ORDER — IOHEXOL 350 MG/ML SOLN
100.0000 mL | Freq: Once | INTRAVENOUS | Status: AC | PRN
  Administered 2011-07-17: 100 mL via INTRAVENOUS

## 2011-07-18 DIAGNOSIS — I82409 Acute embolism and thrombosis of unspecified deep veins of unspecified lower extremity: Secondary | ICD-10-CM

## 2011-07-18 LAB — BASIC METABOLIC PANEL
Calcium: 9.1 mg/dL (ref 8.4–10.5)
Chloride: 98 mEq/L (ref 96–112)
Chloride: 94 mEq/L — ABNORMAL LOW (ref 96–112)
Creatinine, Ser: 1.17 mg/dL — ABNORMAL HIGH (ref 0.50–1.10)
Creatinine, Ser: 1.28 mg/dL — ABNORMAL HIGH (ref 0.50–1.10)
GFR calc Af Amer: >60 mL/min (ref >60)
GFR calc Af Amer: 55 mL/min — ABNORMAL LOW (ref >60)
Sodium: 135 mEq/L (ref 135–145)
Sodium: 133 mEq/L — ABNORMAL LOW (ref 135–145)

## 2011-07-18 LAB — MAGNESIUM: Magnesium: 1.8 mg/dL (ref 1.5–2.5)

## 2011-07-18 LAB — CBC
Hemoglobin: 9.8 g/dL — ABNORMAL LOW (ref 12.0–15.0)
MCHC: 33.8 g/dL (ref 30.0–36.0)
RBC: 4.25 MIL/uL (ref 3.87–5.11)
WBC: 5.5 10*3/uL (ref 4.0–10.5)

## 2011-07-18 LAB — PROTIME-INR
INR: 1.06 (ref 0.00–1.49)
Prothrombin Time: 14 seconds (ref 11.6–15.2)

## 2011-07-18 LAB — CARDIAC PANEL(CRET KIN+CKTOT+MB+TROPI)
Relative Index: INVALID (ref 0.0–2.5)
Troponin I: <0.3 ng/mL (ref <0.30)

## 2011-07-19 LAB — BASIC METABOLIC PANEL
BUN: 28 mg/dL — ABNORMAL HIGH (ref 6–23)
CO2: 29 mEq/L (ref 19–32)
CO2: 31 mEq/L (ref 19–32)
Calcium: 9.2 mg/dL (ref 8.4–10.5)
Calcium: 9.2 mg/dL (ref 8.4–10.5)
Creatinine, Ser: 1.56 mg/dL — ABNORMAL HIGH (ref 0.50–1.10)
GFR calc Af Amer: 45 mL/min — ABNORMAL LOW (ref >60)
GFR calc non Af Amer: 37 mL/min — ABNORMAL LOW (ref >60)
GFR calc non Af Amer: 44 mL/min — ABNORMAL LOW (ref >60)
Glucose, Bld: 157 mg/dL — ABNORMAL HIGH (ref 70–99)
Potassium: 4 mEq/L (ref 3.5–5.1)
Sodium: 131 mEq/L — ABNORMAL LOW (ref 135–145)
Sodium: 132 mEq/L — ABNORMAL LOW (ref 135–145)

## 2011-07-19 LAB — MAGNESIUM: Magnesium: 1.9 mg/dL (ref 1.5–2.5)

## 2011-07-19 LAB — CARDIAC PANEL(CRET KIN+CKTOT+MB+TROPI)
Total CK: 48 U/L (ref 7–177)
Total CK: 52 U/L (ref 7–177)
Troponin I: <0.3 ng/mL (ref <0.30)

## 2011-07-20 LAB — BASIC METABOLIC PANEL
BUN: 31 mg/dL — ABNORMAL HIGH (ref 6–23)
CO2: 30 mEq/L (ref 19–32)
Chloride: 93 mEq/L — ABNORMAL LOW (ref 96–112)
Creatinine, Ser: 1.42 mg/dL — ABNORMAL HIGH (ref 0.50–1.10)
GFR calc Af Amer: 52 mL/min — ABNORMAL LOW (ref >90)

## 2011-07-20 LAB — CBC
Hemoglobin: 9.8 g/dL — ABNORMAL LOW (ref 12.0–15.0)
MCH: 22.7 pg — ABNORMAL LOW (ref 26.0–34.0)
MCHC: 33.4 g/dL (ref 30.0–36.0)

## 2011-07-20 LAB — PROTIME-INR: Prothrombin Time: 14.3 seconds (ref 11.6–15.2)

## 2011-07-21 LAB — BASIC METABOLIC PANEL
CO2: 28 mEq/L (ref 19–32)
Calcium: 9.4 mg/dL (ref 8.4–10.5)
Creatinine, Ser: 1.29 mg/dL — ABNORMAL HIGH (ref 0.50–1.10)
Glucose, Bld: 98 mg/dL (ref 70–99)

## 2011-07-22 ENCOUNTER — Institutional Professional Consult (permissible substitution): Payer: Self-pay | Admitting: Pulmonary Disease

## 2011-07-22 LAB — BASIC METABOLIC PANEL
BUN: 25 mg/dL — ABNORMAL HIGH (ref 6–23)
CO2: 28 mEq/L (ref 19–32)
Chloride: 97 mEq/L (ref 96–112)
Creatinine, Ser: 1.24 mg/dL — ABNORMAL HIGH (ref 0.50–1.10)

## 2011-07-23 ENCOUNTER — Ambulatory Visit: Payer: Self-pay | Admitting: Cardiovascular Disease

## 2011-07-23 LAB — BASIC METABOLIC PANEL
BUN: 24 mg/dL — ABNORMAL HIGH (ref 6–23)
Chloride: 100 mEq/L (ref 96–112)
GFR calc Af Amer: 60 mL/min — ABNORMAL LOW (ref >90)
Glucose, Bld: 130 mg/dL — ABNORMAL HIGH (ref 70–99)
Potassium: 4.3 mEq/L (ref 3.5–5.1)

## 2011-07-23 LAB — CBC
Hemoglobin: 9.2 g/dL — ABNORMAL LOW (ref 12.0–15.0)
MCH: 23 pg — ABNORMAL LOW (ref 26.0–34.0)
MCV: 68.3 fL — ABNORMAL LOW (ref 78.0–100.0)
RBC: 4 MIL/uL (ref 3.87–5.11)

## 2011-07-23 LAB — PROTIME-INR: Prothrombin Time: 21.9 seconds — ABNORMAL HIGH (ref 11.6–15.2)

## 2011-07-28 ENCOUNTER — Ambulatory Visit (HOSPITAL_COMMUNITY): Admission: RE | Admit: 2011-07-28 | Payer: Medicaid Other | Source: Ambulatory Visit | Admitting: Cardiology

## 2011-07-29 ENCOUNTER — Encounter: Payer: Self-pay | Admitting: Cardiovascular Disease

## 2011-07-29 ENCOUNTER — Encounter: Payer: Self-pay | Admitting: *Deleted

## 2011-07-30 ENCOUNTER — Ambulatory Visit: Payer: Self-pay | Admitting: Cardiovascular Disease

## 2011-08-03 NOTE — Discharge Summary (Signed)
[name], [name]              ACCOUNT NO.:  [id number]  MEDICAL RECORD NO.:  [id number]  LOCATION:  3728                         FACILITY:  [facility]  PHYSICIAN:  Clydia Llano, MD       DATE OF BIRTH:  Aug 20, 1966  DATE OF ADMISSION:  07/16/2011 DATE OF DISCHARGE:                              DISCHARGE SUMMARY   PRIMARY CARE PHYSICIAN:  Dr. Andrey Campanile at [facility].  PRIMARY CARDIOLOGIST:  Pamella Pert, MD  REASON FOR ADMISSION:  Palpitations.  DISCHARGE DIAGNOSES: 1. Severe hyperkalemia. 2. Right lower extremity acute deep venous thrombosis. 3. Bradycardia, resolved. 4. Acute renal insufficiency. 5. Hypertension. 6. Cardiomyopathy with ejection fraction of 15-20% in July 2012. 7. Obesity.  DISCHARGE MEDICATIONS: 1. Coumadin 10 mg p.o. every evening. 2. Lovenox 100 mg subcutaneously twice daily for 3 more days. 3. Ferrous sulfate 325 mg p.o. b.i.d. 4. Hydralazine 50 mg 3 times a day. 5. Imdur 60 mg p.o. daily. 6. Carvedilol 6.25 mg p.o. b.i.d. 7. Aspirin 81 mg p.o. daily. 8. Digoxin 0.25 mg p.o. daily.  RADIOLOGY: 1. CT angio on September 28 showed no evidence of pulmonary embolism. 2. Chest x-ray, September 27 showed no evidence of acute, cardiac, or     pulmonary process. 3. Lower extremity Doppler, September 28 showed acute DVT involving     the right lower extremity.  There is no evidence of DVT in the left     lower extremity.  BRIEF HISTORY/EXAMINATION:  [name] is a 45 year old female with history of her congestive heart failure.  The patient came into the hospital complaining about heart racing, shortness of breath and dizziness, sensation of numbness in all of her extremities and presyncope.  The patient asked her sister to call EMS and brought to the hospital by ambulance.  The patient was found to have heart rate of 30 and blood pressure of 70/40.  In the emergency department, the patient received atropine 1 mg and heart rate increases to 60s.   Blood pressure stabilized with IV fluids.  Immediate evaluation revealed potassium level of greater than 7.5.  The patient received IV dextrose, insulin, sodium bicarb, and calcium gluconate.  The patient felt better, admitted to the hospital for further evaluation.  The patient also was complaining about right lower extremity swelling.  BRIEF HOSPITAL COURSE: 1. Severe hyperkalemia.  The patient admitted to the hospital.  As     mentioned above, she was started on dextrose and insulin and the     emergency department, also was given Kayexalate.  So, potassium was     going down nicely.  It went down and was in 48 hours to the normal     levels.  From the EKG, she have junctional was rhythm.  Potassium     went down to 4.3 at the day of discharge. 2. Acute renal failure.  When she was admitted to the hospital with     the high potassium, her creatinine was 1.3 and went up to 1.56.  As     mentioned above, the patient was on diuretics including     spironolactone, these were stopped.  The patient showed no signs of  fluid overload or signs and symptoms of decompensating of her heart     failure.  At the day of discharge, BUN/creatinine is 24/1.25.  The     patient is deemed safe to be discharged.  Because of there is no     fluid overload, the patient was not discharged on diuretics.  The     patient will follow up closely with Dr. Jacinto Halim. 3. Nonischemic cardiomyopathy.  The patient to follow up with Dr.     Jacinto Halim.  The patient's diuretics was discontinued as well as     lisinopril because of the renal function.  The Coreg was continued.     Hydralazine was added as well as the Imdur.  The digoxin was     continued.  The patient to be followed by her cardiologist for     further adjustment in her regimen.  DISCHARGE INSTRUCTIONS: 1. Activity:  As tolerated. 2. Disposition:  Home. 3. Diet:  Heart-healthy diet.     Clydia Llano, MD     ME/MEDQ  D:  07/23/2011  T:  07/23/2011   Job:  [id number]  cc:   Pamella Pert, MD Dr. Andrey Campanile  Electronically Signed by Clydia Llano  on 08/03/2011 01:52:20 PM

## 2011-08-16 NOTE — H&P (Signed)
NAMEMEARL, HAREWOOD NO.:  [id number]  MEDICAL RECORD NO.:  [id number]  LOCATION:  [facility]                         FACILITY:  [facility]  PHYSICIAN:  Jonny Ruiz, MD    DATE OF BIRTH:  1966-09-20  DATE OF ADMISSION:  07/16/2011 DATE OF DISCHARGE:                             HISTORY & PHYSICAL   PRIMARY CARE PHYSICIAN:  Pamella Pert, MD  CHIEF COMPLAINT:  Palpitations.  HISTORY OF PRESENT ILLNESS:  The patient is a 45 year old female with a history of severe hypertension and hypertensive cardiomyopathy with CHF, who was doing well until this morning when she developed sudden onset of palpitations described as heart racing, shortness of breath, dizziness, sensation of numbness in all four extremities, sensation of passing out, and diaphoresis.  There was no syncope.  She asked her sister to call EMS and she was brought by ambulance to the emergency department.  EMS evaluated the patient and found her with a heart rate of 30 and a blood pressure of 7/40.  No treatment given.  In the ED, the patient received atropine 1 mg and her heart rate increased to 60s.  Blood pressure stabilized.  Immediate evaluation in emergency department revealed a potassium level of greater than 7.5 which repeated was 7.2 and the patient received IV fluids, IV insulin, sodium bicarb, and dextrose bolus.  The patient felt better and we were called for further evaluation and management.  At the time of my evaluation, the patient was asymptomatic.  Feeling much better.  Heart rate in the 60s, blood pressure normalized to 113/46.  Sat 100%.  Of note, the patient tells me that she has recently changed her diet to a low-caloric diet and has substituted a low salt and low-caloric and has substituted regular salt for "no-salt"substitute.  PAST MEDICAL HISTORY: 1. Severe hypertension. 2. Hypertensive cardiomyopathy with CHF.  MEDICATIONS:  Coreg, digoxin, lisinopril, aspirin,  diltiazem, hydrochlorothiazide, spironolactone.  ALLERGIES:  DETROL.  FAMILY HISTORY:  A sister and mother with hypertension.  Several family members with diabetes.  SOCIAL HISTORY:  Single mother of three.  She has grandchildren.  She is a Futures trader.  Denies tobacco, alcohol, or illicits.  REVIEW OF SYSTEMS:  As in HPI.  PHYSICAL EXAMINATION:  GENERAL:  The patient is an overweight African American woman who is in no distress, pleasant, and cooperative. HEENT: Unremarkable. NECK:  Supple.  No JVD. HEART:  Regular S1 and S2 without gallops, murmurs, or rubs. LUNGS:  Clear to auscultation. ABDOMEN:  Soft, nontender.  No organomegaly or masses palpable. EXTREMITIES:  No clubbing, cyanosis, or edema.  Good distal pulses. NEUROLOGICAL:  Alert and oriented x3.  Cranial nerves II through XII intact.  Motor strength 5/5 in the upper and lower extremities.  Sensory intact.  No Babinski.  Finger-to-nose normal.  LABORATORY ASSESSMENT:  Sodium 132, potassium greater than 7.5, chloride 103, carbon dioxide 21, glucose 113, BUN 24, creatinine 1.33, calcium 9.6.  Liver function tests normal.  Glomerular filtration rate 53. Digoxin 1.3.  Cardiac enzymes negative.  CBC significant for a hemoglobin of 9.6, hematocrit 28.2.  Chest x-ray, NAD.  EKG, sinus bradycardia at 39 bpm.  No ST or T-wave abnormalities.  MEDICAL DECISION MAKING:  A 45 year old female with a history of essential hypertension and congestive heart failure, who recently changed her diet and has began taking no-salt substitute which contains potassium chloride and has developed severe hyperkalemia complicated by bradycardia.  The patient was already taking potassium-sparing agents which complicated the picture.  She has already stage I chronic kidney disease and also was taking beta-blockers, calcium channel blockers, and an ACE inhibitor.  PLAN: 1. Admit the patient to telemetry.  Discontinue lisinopril,     carvedilol,  diltiazem, spironolactone, and digoxin for the time     being.  The patient has responded to atropine, insulin, dextrose,     calcium carbonate, and she is now hemodynamically stable.  She will     have another BMET in 2 hours.  She will be given hydralazine 25 mg     q.6 h. along with hydrochlorothiazide for blood pressure control.     Resume if blood pressure needs a thorough agent, I think a     nondihydropyridine alternative like amlodipine will be a good     choice.  The patient was explained the risk of "no-salt"     substitutes and was advised to stay away from it. 2. Incidental anemia.  Mild.  Check CBC in a.m.          ______________________________ Jonny Ruiz, MD     GL/MEDQ  D:  07/16/2011  T:  07/16/2011  Job:  [id number]  Electronically Signed by Jonny Ruiz MD on 08/16/2011 09:45:31 PM

## 2011-08-19 ENCOUNTER — Encounter: Payer: Self-pay | Admitting: Pulmonary Disease

## 2011-08-19 ENCOUNTER — Ambulatory Visit (INDEPENDENT_AMBULATORY_CARE_PROVIDER_SITE_OTHER): Payer: Medicaid Other | Admitting: Pulmonary Disease

## 2011-08-19 VITALS — BP 130/76 | HR 87 | Temp 98.2°F | Ht 62.0 in | Wt 237.4 lb

## 2011-08-19 DIAGNOSIS — R0989 Other specified symptoms and signs involving the circulatory and respiratory systems: Secondary | ICD-10-CM

## 2011-08-19 DIAGNOSIS — R0609 Other forms of dyspnea: Secondary | ICD-10-CM | POA: Insufficient documentation

## 2011-08-19 NOTE — Patient Instructions (Signed)
Will set up for a lung scan to look for small blood clots in the periphery of the lung.  If this is normal, would do breathing tests to evaluate for asthma Work on weight loss and conditioning.

## 2011-08-19 NOTE — Assessment & Plan Note (Signed)
The patient has persistent dyspnea on exertion despite treatment of her recent congestive heart failure.  She feels that she has never gotten back to her baseline.  She has been found to have DVT, but her CT chest did not show a PE.  I suspect that her persistent dyspnea on exertion is related to her heart disease, obesity, and deconditioning.  However, would consider pulmonary issues such as obstructive lung disease or possibly chronic thromboembolic disease.  I do not have her echo report to see if she has elevated pulmonary artery pressures.  Will schedule for a ventilation perfusion scan to rule out chronic thromboembolic disease, and if this is unremarkable will do pulmonary function studies.

## 2011-08-19 NOTE — Progress Notes (Signed)
  Subjective:    Patient ID: [name], female    DOB: 1966-08-19, 45 y.o.   MRN: [id number]  HPI The patient is a 45 year old female who I've been asked to see for dyspnea on exertion.  The patient states she was in her usual state of health until July of this year, and she was admitted for congestive heart failure.  She was found by echo to have a cardiomyopathy with an ejection fraction of 15-20% and diffuse hypokinesis.  She was diuresed and placed on appropriate medications, and although she feels better, she has not returned to her usual baseline.  The patient was also found to have a DVT last month, and is on Coumadin currently.  She had a CT scan of her chest that showed no acute process and no pulmonary embolus.  She denies any definite pleuritic chest pain.  The patient describes a less than one block dyspnea on exertion at a moderate pace on flat ground.  She also will get winded bringing groceries in from the car, or doing light housework.  The patient has no history of asthma, and denies a chronic cough.  She also denies worsening lower extremity edema.  She states that her weight is down 41 pounds this year.   Review of Systems  Constitutional: Positive for appetite change. Negative for fever and unexpected weight change.  HENT: Negative for ear pain, nosebleeds, congestion, sore throat, rhinorrhea, sneezing, trouble swallowing, dental problem, postnasal drip and sinus pressure.   Eyes: Negative for redness and itching.  Respiratory: Positive for cough and shortness of breath. Negative for chest tightness and wheezing.   Cardiovascular: Negative for palpitations and leg swelling.  Gastrointestinal: Positive for abdominal pain. Negative for nausea and vomiting.  Genitourinary: Negative for dysuria.  Musculoskeletal: Negative for joint swelling.  Skin: Negative for rash.  Neurological: Negative for headaches.  Hematological: Does not bruise/bleed easily.  Psychiatric/Behavioral:  Negative for dysphoric mood. The patient is not nervous/anxious.        Objective:   Physical Exam Constitutional:  Obese female, no acute distress  HENT:  Nares patent without discharge  Oropharynx without exudate, palate and uvula are elongated  Eyes:  Perrla, eomi, no scleral icterus  Neck:  No JVD, no TMG  Cardiovascular:  Normal rate, regular rhythm, no rubs or gallops.  2/6 sem        Intact distal pulses  Pulmonary :  Normal breath sounds, no stridor or respiratory distress   No rhonchi, or wheezing.  Faint bibasilar crackles.  Abdominal:  Soft, nondistended, bowel sounds present.  No tenderness noted.   Musculoskeletal:  No lower extremity edema noted.  Lymph Nodes:  No cervical lymphadenopathy noted  Skin:  No cyanosis noted  Neurologic:  Alert, appropriate, moves all 4 extremities without obvious deficit.         Assessment & Plan:

## 2011-08-20 ENCOUNTER — Other Ambulatory Visit (HOSPITAL_COMMUNITY): Payer: Medicaid Other

## 2011-08-21 ENCOUNTER — Encounter (HOSPITAL_COMMUNITY): Payer: Self-pay

## 2011-08-21 ENCOUNTER — Encounter (HOSPITAL_COMMUNITY)
Admission: RE | Admit: 2011-08-21 | Discharge: 2011-08-21 | Disposition: A | Discharge status: Home / Self Care | Payer: Medicaid Other | Source: Ambulatory Visit | Attending: Pulmonary Disease | Admitting: Pulmonary Disease

## 2011-08-21 DIAGNOSIS — R0609 Other forms of dyspnea: Secondary | ICD-10-CM

## 2011-08-21 DIAGNOSIS — R0989 Other specified symptoms and signs involving the circulatory and respiratory systems: Secondary | ICD-10-CM | POA: Insufficient documentation

## 2011-08-21 DIAGNOSIS — R0602 Shortness of breath: Secondary | ICD-10-CM | POA: Insufficient documentation

## 2011-08-21 MED ORDER — TECHNETIUM TO 99M ALBUMIN AGGREGATED
5.5000 | Freq: Once | INTRAVENOUS | Status: AC | PRN
  Administered 2011-08-21: 5.5 via INTRAVENOUS

## 2011-08-21 MED ORDER — XENON XE 133 GAS
5.4000 | GAS_FOR_INHALATION | Freq: Once | RESPIRATORY_TRACT | Status: AC | PRN
  Administered 2011-08-21: 5.4 via RESPIRATORY_TRACT

## 2011-08-28 ENCOUNTER — Encounter: Payer: Self-pay | Admitting: *Deleted

## 2011-09-26 ENCOUNTER — Emergency Department (HOSPITAL_COMMUNITY)
Admission: EM | Admit: 2011-09-26 | Discharge: 2011-09-27 | Disposition: A | Discharge status: Home / Self Care | Payer: Medicaid Other | Attending: Emergency Medicine | Admitting: Emergency Medicine

## 2011-09-26 ENCOUNTER — Encounter (HOSPITAL_COMMUNITY): Payer: Self-pay | Admitting: *Deleted

## 2011-09-26 ENCOUNTER — Emergency Department (HOSPITAL_COMMUNITY): Payer: Medicaid Other

## 2011-09-26 ENCOUNTER — Other Ambulatory Visit: Payer: Self-pay

## 2011-09-26 DIAGNOSIS — R0789 Other chest pain: Secondary | ICD-10-CM

## 2011-09-26 DIAGNOSIS — F17200 Nicotine dependence, unspecified, uncomplicated: Secondary | ICD-10-CM | POA: Insufficient documentation

## 2011-09-26 DIAGNOSIS — Z7982 Long term (current) use of aspirin: Secondary | ICD-10-CM | POA: Insufficient documentation

## 2011-09-26 DIAGNOSIS — Z7901 Long term (current) use of anticoagulants: Secondary | ICD-10-CM | POA: Insufficient documentation

## 2011-09-26 DIAGNOSIS — I509 Heart failure, unspecified: Secondary | ICD-10-CM | POA: Insufficient documentation

## 2011-09-26 DIAGNOSIS — R0602 Shortness of breath: Secondary | ICD-10-CM | POA: Insufficient documentation

## 2011-09-26 DIAGNOSIS — R51 Headache: Secondary | ICD-10-CM | POA: Insufficient documentation

## 2011-09-26 DIAGNOSIS — Z86718 Personal history of other venous thrombosis and embolism: Secondary | ICD-10-CM | POA: Insufficient documentation

## 2011-09-26 DIAGNOSIS — R05 Cough: Secondary | ICD-10-CM | POA: Insufficient documentation

## 2011-09-26 DIAGNOSIS — I10 Essential (primary) hypertension: Secondary | ICD-10-CM | POA: Insufficient documentation

## 2011-09-26 DIAGNOSIS — R059 Cough, unspecified: Secondary | ICD-10-CM | POA: Insufficient documentation

## 2011-09-26 DIAGNOSIS — R071 Chest pain on breathing: Secondary | ICD-10-CM | POA: Insufficient documentation

## 2011-09-26 DIAGNOSIS — Z79899 Other long term (current) drug therapy: Secondary | ICD-10-CM | POA: Insufficient documentation

## 2011-09-26 LAB — POCT I-STAT, CHEM 8
Creatinine, Ser: 1.1 mg/dL (ref 0.50–1.10)
Hemoglobin: 8.8 g/dL — ABNORMAL LOW (ref 12.0–15.0)
Sodium: 142 mEq/L (ref 135–145)
TCO2: 26 mmol/L (ref 0–100)

## 2011-09-26 MED ORDER — ACETAMINOPHEN 325 MG PO TABS
650.0000 mg | ORAL_TABLET | Freq: Once | ORAL | Status: AC
  Administered 2011-09-27: 650 mg via ORAL
  Filled 2011-09-26: qty 2

## 2011-09-26 NOTE — ED Provider Notes (Signed)
History     CSN: [id number] Arrival date & time: 09/26/2011  9:36 PM   First MD Initiated Contact with Patient 09/26/11 2337      Chief Complaint  Patient presents with  . Chest Pain    (Consider location/radiation/quality/duration/timing/severity/associated sxs/prior treatment) HPI This 45 year old female has a history of hypertension, chronic shortness of breath, chronic chest pain syndrome, deep venous thrombosis, with negative CT scan for pulmonary embolism and negative ventilation perfusion lung scan for pulmonary embolism within the last 6 months for the same symptoms. She states once or twice a week for several hours at a time she will have a sharp stabbing positional nonexertional chest discomfort as well as shortness of breath the last for hours at a time with a slight nonproductive cough without fever. She had typical symptoms all day today for more than 8 hours and so came to the ED for evaluation as she has in the past. She has no fever no lightheadedness no syncope no exertional discomfort no edema no vomiting no diarrhea no bloody stools. She has chronic anemia. She did have a gradual onset and mild headache today for several hours that gradually came on and is now almost gone and was not sudden onset is not severe without any change in speech or vision swallowing or understanding and without localized weakness or numbness or incoordination.  Today's chest discomfort has been constant for hours nonradiating localized to the left chest sharp stabbing nonexertional but is positional and moderate in severity with associated shortness of breath just like in the past with no prior treatment today. Past Medical History  Diagnosis Date  . HTN (hypertension)   . Fatigue   . CHF (congestive heart failure) 04/2011  . DOE (dyspnea on exertion)   . Orthopnea   . DVT (deep venous thrombosis) 06/2011    Past Surgical History  Procedure Date  . Tubal ligation 2006  . Cyst removed from ovary      No family history on file.  History  Substance Use Topics  . Smoking status: Former Smoker -- 0.5 packs/day for 5 years    Types: Cigarettes    Quit date: 10/19/1993  . Smokeless tobacco: Not on file  . Alcohol Use: No    OB History    Grav Para Term Preterm Abortions TAB SAB Ect Mult Living                  Review of Systems  Constitutional: Negative for fever.       10 Systems reviewed and are negative for acute change except as noted in the HPI.  HENT: Negative for congestion.   Eyes: Negative for discharge and redness.  Respiratory: Positive for cough and shortness of breath.   Cardiovascular: Positive for chest pain. Negative for leg swelling.  Gastrointestinal: Negative for vomiting and abdominal pain.  Musculoskeletal: Negative for back pain.  Skin: Negative for rash.  Neurological: Negative for syncope, numbness and headaches.  Psychiatric/Behavioral:       No behavior change.    Allergies  Detrol  Home Medications   Current Outpatient Rx  Name Route Sig Dispense Refill  . ALBUTEROL SULFATE HFA 108 (90 BASE) MCG/ACT IN AERS Inhalation Inhale 2 puffs into the lungs every 6 (six) hours as needed. For shortness of breath.    . ASPIRIN 81 MG PO TABS Oral Take 81 mg by mouth daily.      Marland Kitchen CARVEDILOL 6.25 MG PO TABS Oral Take 6.25 mg by mouth  2 (two) times daily.      Marland Kitchen DIGOXIN 0.25 MG PO TABS Oral Take 250 mcg by mouth daily.      . IRON 325 (65 FE) MG PO TABS Oral Take 1 tablet by mouth 2 (two) times daily.      Marland Kitchen HYDRALAZINE HCL 50 MG PO TABS Oral Take 50 mg by mouth 3 (three) times daily.      . ISOSORBIDE MONONITRATE ER 60 MG PO TB24 Oral Take 60 mg by mouth daily.      . WARFARIN SODIUM 10 MG PO TABS Oral Take 10 mg by mouth daily.        BP 128/65  Pulse 74  Temp(Src) 98.6 F (37 C) (Oral)  Resp 19  SpO2 100%  Physical Exam  Nursing note and vitals reviewed. Constitutional:       Awake, alert, nontoxic appearance with baseline speech for  patient.  HENT:  Head: Atraumatic.  Mouth/Throat: No oropharyngeal exudate.  Eyes: EOM are normal. Pupils are equal, round, and reactive to light. Right eye exhibits no discharge. Left eye exhibits no discharge.  Neck: Neck supple.  Cardiovascular: Normal rate and regular rhythm.   No murmur heard. Pulmonary/Chest: Effort normal and breath sounds normal. No stridor. No respiratory distress. She has no wheezes. She has no rales. She exhibits tenderness.       Exactly reproducible left anterior and lateral chest wall tenderness without deformity or rash  Abdominal: Soft. Bowel sounds are normal. She exhibits no mass. There is no tenderness. There is no rebound.  Musculoskeletal: She exhibits no tenderness.       Baseline ROM, moves extremities with no obvious new focal weakness.  Lymphadenopathy:    She has no cervical adenopathy.  Neurological:       Awake, alert, cooperative and aware of situation; motor strength bilaterally; sensation normal to light touch bilaterally; peripheral visual fields full to confrontation; no facial asymmetry; tongue midline; major cranial nerves appear intact; no pronator drift, normal finger to nose bilaterally  Skin: No rash noted.  Psychiatric: She has a normal mood and affect.    ED Course  Procedures (including critical care time) ECG: Sinus rhythm, ventricular rate 70, normal axis, normal intervals, no acute ischemic changes noted, impression normal ECG, compared to September 2012 inverted T waves are no longer seen in the inferolateral leads Labs Reviewed  POCT I-STAT, CHEM 8 - Abnormal; Notable for the following:    Glucose, Bld 114 (*)    Hemoglobin 8.8 (*)    HCT 26.0 (*)    All other components within normal limits  PROTIME-INR - Abnormal; Notable for the following:    Prothrombin Time 21.1 (*)    INR 1.79 (*)    All other components within normal limits  TROPONIN I  LAB REPORT - SCANNED   Dg Chest 2 View  09/27/2011  *RADIOLOGY REPORT*   Clinical Data: Chest pain and shortness of breath  CHEST - 2 VIEW  Comparison: 08/21/2011  Findings: The heart size and pulmonary vascularity are normal. The lungs appear clear and expanded without focal air space disease or consolidation. No blunting of the costophrenic angles.  Mild degenerative changes in the spine.  No pneumothorax.  Stable appearance since previous study.  IMPRESSION: No evidence of active pulmonary disease.  Original Report Authenticated By: Marlon Pel, M.D.     1. Chest wall pain       MDM  I doubt any other [facility] precluding discharge at this  time including, but not necessarily limited to the following:ACS, PE.        Hurman Horn, MD 09/27/11 [phone]

## 2011-09-26 NOTE — ED Notes (Signed)
Patient with c/o left sided today with a headache.  Patient also c/o dizziness and shortness of breath.  No other associated symptoms

## 2011-09-27 ENCOUNTER — Other Ambulatory Visit: Payer: Self-pay

## 2011-09-27 ENCOUNTER — Emergency Department (HOSPITAL_COMMUNITY): Payer: Medicaid Other

## 2011-09-27 LAB — PROTIME-INR
INR: 1.79 — ABNORMAL HIGH (ref 0.00–1.49)
Prothrombin Time: 21.1 seconds — ABNORMAL HIGH (ref 11.6–15.2)

## 2011-10-07 ENCOUNTER — Telehealth: Payer: Self-pay | Admitting: Pulmonary Disease

## 2011-10-07 NOTE — Telephone Encounter (Signed)
I had attempted to contact pt multiple times regarding her Lung scan results but was unable to get ahold of pt and she never returned my calls.  I had therefore sent pt a letter to her home address we had on file to request that she call our office to get these results.  I sent letter certified mail.  Letter came back to the office with a return to sender. NSN.  Unable to forward.

## 2011-11-01 ENCOUNTER — Encounter (HOSPITAL_COMMUNITY): Payer: Self-pay | Admitting: Internal Medicine

## 2011-11-01 ENCOUNTER — Inpatient Hospital Stay (HOSPITAL_COMMUNITY)
Admission: EM | Admit: 2011-11-01 | Discharge: 2011-11-03 | DRG: 292 | Disposition: A | Discharge status: Home / Self Care | Payer: Medicaid Other | Attending: Internal Medicine | Admitting: Internal Medicine

## 2011-11-01 ENCOUNTER — Other Ambulatory Visit: Payer: Self-pay

## 2011-11-01 ENCOUNTER — Emergency Department (HOSPITAL_COMMUNITY): Payer: Medicaid Other

## 2011-11-01 DIAGNOSIS — I5041 Acute combined systolic (congestive) and diastolic (congestive) heart failure: Secondary | ICD-10-CM

## 2011-11-01 DIAGNOSIS — I10 Essential (primary) hypertension: Secondary | ICD-10-CM | POA: Diagnosis present

## 2011-11-01 DIAGNOSIS — Z7901 Long term (current) use of anticoagulants: Secondary | ICD-10-CM

## 2011-11-01 DIAGNOSIS — I110 Hypertensive heart disease with heart failure: Secondary | ICD-10-CM | POA: Diagnosis present

## 2011-11-01 DIAGNOSIS — R079 Chest pain, unspecified: Secondary | ICD-10-CM | POA: Diagnosis present

## 2011-11-01 DIAGNOSIS — D509 Iron deficiency anemia, unspecified: Secondary | ICD-10-CM | POA: Diagnosis present

## 2011-11-01 DIAGNOSIS — I82409 Acute embolism and thrombosis of unspecified deep veins of unspecified lower extremity: Secondary | ICD-10-CM

## 2011-11-01 DIAGNOSIS — Z888 Allergy status to other drugs, medicaments and biological substances status: Secondary | ICD-10-CM

## 2011-11-01 DIAGNOSIS — I509 Heart failure, unspecified: Secondary | ICD-10-CM | POA: Diagnosis present

## 2011-11-01 DIAGNOSIS — R0602 Shortness of breath: Secondary | ICD-10-CM | POA: Diagnosis present

## 2011-11-01 DIAGNOSIS — R0601 Orthopnea: Secondary | ICD-10-CM | POA: Diagnosis present

## 2011-11-01 DIAGNOSIS — D649 Anemia, unspecified: Secondary | ICD-10-CM

## 2011-11-01 DIAGNOSIS — D500 Iron deficiency anemia secondary to blood loss (chronic): Secondary | ICD-10-CM | POA: Diagnosis present

## 2011-11-01 DIAGNOSIS — R609 Edema, unspecified: Secondary | ICD-10-CM

## 2011-11-01 DIAGNOSIS — I5040 Unspecified combined systolic (congestive) and diastolic (congestive) heart failure: Principal | ICD-10-CM | POA: Diagnosis present

## 2011-11-01 DIAGNOSIS — N920 Excessive and frequent menstruation with regular cycle: Secondary | ICD-10-CM | POA: Diagnosis present

## 2011-11-01 DIAGNOSIS — R791 Abnormal coagulation profile: Secondary | ICD-10-CM

## 2011-11-01 DIAGNOSIS — I428 Other cardiomyopathies: Secondary | ICD-10-CM | POA: Diagnosis present

## 2011-11-01 DIAGNOSIS — Z87891 Personal history of nicotine dependence: Secondary | ICD-10-CM

## 2011-11-01 DIAGNOSIS — Z6841 Body Mass Index (BMI) 40.0 and over, adult: Secondary | ICD-10-CM

## 2011-11-01 HISTORY — DX: Excessive and frequent menstruation with regular cycle: N92.0

## 2011-11-01 HISTORY — DX: Anemia, unspecified: D64.9

## 2011-11-01 LAB — BASIC METABOLIC PANEL
BUN: 13 mg/dL (ref 6–23)
GFR calc Af Amer: 88 mL/min — ABNORMAL LOW (ref >90)
GFR calc non Af Amer: 76 mL/min — ABNORMAL LOW (ref >90)
Potassium: 3.8 mEq/L (ref 3.5–5.1)

## 2011-11-01 LAB — PROTIME-INR: INR: 1.1 (ref 0.00–1.49)

## 2011-11-01 LAB — CARDIAC PANEL(CRET KIN+CKTOT+MB+TROPI)
CK, MB: 1.8 ng/mL (ref 0.3–4.0)
Relative Index: 1.6 (ref 0.0–2.5)
Relative Index: 1.5 (ref 0.0–2.5)
Total CK: 105 U/L (ref 7–177)
Troponin I: <0.3 ng/mL (ref <0.30)

## 2011-11-01 LAB — CBC
HCT: 22.8 % — ABNORMAL LOW (ref 36.0–46.0)
Hemoglobin: 8.9 g/dL — ABNORMAL LOW (ref 12.0–15.0)
MCHC: 32.9 g/dL (ref 30.0–36.0)
MCHC: 32.6 g/dL (ref 30.0–36.0)
Platelets: 308 10*3/uL (ref 150–400)
Platelets: 309 10*3/uL (ref 150–400)
RBC: 3.74 MIL/uL — ABNORMAL LOW (ref 3.87–5.11)
RDW: 16.2 % — ABNORMAL HIGH (ref 11.5–15.5)
RDW: 16.2 % — ABNORMAL HIGH (ref 11.5–15.5)
WBC: 5.2 10*3/uL (ref 4.0–10.5)

## 2011-11-01 LAB — TYPE AND SCREEN: Antibody Screen: NEGATIVE

## 2011-11-01 MED ORDER — SODIUM CHLORIDE 0.9 % IJ SOLN
3.0000 mL | Freq: Two times a day (BID) | INTRAMUSCULAR | Status: DC
  Administered 2011-11-01 – 2011-11-02 (×4): 3 mL via INTRAVENOUS

## 2011-11-01 MED ORDER — ONDANSETRON HCL 4 MG PO TABS: 4.0000 mg | ORAL_TABLET | Freq: Four times a day (QID) | ORAL | Status: DC | PRN

## 2011-11-01 MED ORDER — GUAIFENESIN-DM 100-10 MG/5ML PO SYRP
5.0000 mL | ORAL_SOLUTION | ORAL | Status: DC | PRN
Start: 2011-11-01 — End: 2011-11-03

## 2011-11-01 MED ORDER — FUROSEMIDE 10 MG/ML IJ SOLN: 40.0000 mg | Freq: Every day | INTRAMUSCULAR | Status: DC

## 2011-11-01 MED ORDER — ALBUTEROL SULFATE (5 MG/ML) 0.5% IN NEBU: 2.5000 mg | INHALATION_SOLUTION | RESPIRATORY_TRACT | Status: DC | PRN

## 2011-11-01 MED ORDER — ISOSORBIDE MONONITRATE ER 60 MG PO TB24
60.0000 mg | ORAL_TABLET | Freq: Every day | ORAL | Status: DC
  Administered 2011-11-01 – 2011-11-03 (×3): 60 mg via ORAL
  Filled 2011-11-01 (×3): qty 1

## 2011-11-01 MED ORDER — IOHEXOL 300 MG/ML  SOLN
100.0000 mL | Freq: Once | INTRAMUSCULAR | Status: AC | PRN
  Administered 2011-11-01: 100 mL via INTRAVENOUS

## 2011-11-01 MED ORDER — DIGOXIN 250 MCG PO TABS
250.0000 ug | ORAL_TABLET | Freq: Every day | ORAL | Status: DC
  Administered 2011-11-01 – 2011-11-02 (×2): 250 ug via ORAL
  Filled 2011-11-01 (×3): qty 1

## 2011-11-01 MED ORDER — FUROSEMIDE 40 MG PO TABS
40.0000 mg | ORAL_TABLET | Freq: Every day | ORAL | Status: DC
  Administered 2011-11-02: 40 mg via ORAL
  Filled 2011-11-01 (×2): qty 1

## 2011-11-01 MED ORDER — ASPIRIN 81 MG PO CHEW
324.0000 mg | CHEWABLE_TABLET | Freq: Once | ORAL | Status: AC
  Administered 2011-11-01: 324 mg via ORAL
  Filled 2011-11-01: qty 4

## 2011-11-01 MED ORDER — FUROSEMIDE 10 MG/ML IJ SOLN
40.0000 mg | Freq: Once | INTRAMUSCULAR | Status: AC
  Administered 2011-11-01: 40 mg via INTRAVENOUS
  Filled 2011-11-01 (×2): qty 4

## 2011-11-01 MED ORDER — HYDRALAZINE HCL 50 MG PO TABS
50.0000 mg | ORAL_TABLET | Freq: Three times a day (TID) | ORAL | Status: DC
  Administered 2011-11-01 – 2011-11-03 (×7): 50 mg via ORAL
  Filled 2011-11-01 (×9): qty 1

## 2011-11-01 MED ORDER — ACETAMINOPHEN 325 MG PO TABS
650.0000 mg | ORAL_TABLET | Freq: Four times a day (QID) | ORAL | Status: DC | PRN
  Administered 2011-11-01: 650 mg via ORAL
  Filled 2011-11-01: qty 2

## 2011-11-01 MED ORDER — SODIUM CHLORIDE 0.9 % IJ SOLN: 3.0000 mL | INTRAMUSCULAR | Status: DC | PRN

## 2011-11-01 MED ORDER — ONDANSETRON HCL 4 MG/2ML IJ SOLN: 4.0000 mg | Freq: Four times a day (QID) | INTRAMUSCULAR | Status: DC | PRN

## 2011-11-01 MED ORDER — IRON 325 (65 FE) MG PO TABS: 1.0000 | ORAL_TABLET | Freq: Two times a day (BID) | ORAL | Status: DC

## 2011-11-01 MED ORDER — WARFARIN SODIUM 5 MG PO TABS
5.0000 mg | ORAL_TABLET | Freq: Once | ORAL | Status: AC
  Administered 2011-11-01: 5 mg via ORAL
  Filled 2011-11-01 (×2): qty 1

## 2011-11-01 MED ORDER — ACETAMINOPHEN 650 MG RE SUPP: 650.0000 mg | Freq: Four times a day (QID) | RECTAL | Status: DC | PRN

## 2011-11-01 MED ORDER — CARVEDILOL 6.25 MG PO TABS
6.2500 mg | ORAL_TABLET | Freq: Two times a day (BID) | ORAL | Status: DC
  Administered 2011-11-01 – 2011-11-03 (×5): 6.25 mg via ORAL
  Filled 2011-11-01 (×7): qty 1

## 2011-11-01 MED ORDER — PANTOPRAZOLE SODIUM 40 MG PO TBEC
40.0000 mg | DELAYED_RELEASE_TABLET | Freq: Every day | ORAL | Status: DC
  Administered 2011-11-01 – 2011-11-03 (×3): 40 mg via ORAL
  Filled 2011-11-01 (×3): qty 1

## 2011-11-01 MED ORDER — ENOXAPARIN SODIUM 120 MG/0.8ML ~~LOC~~ SOLN
110.0000 mg | Freq: Two times a day (BID) | SUBCUTANEOUS | Status: DC
  Administered 2011-11-01 – 2011-11-03 (×5): 110 mg via SUBCUTANEOUS
  Filled 2011-11-01 (×7): qty 0.8

## 2011-11-01 MED ORDER — ALUM & MAG HYDROXIDE-SIMETH 200-200-20 MG/5ML PO SUSP: 30.0000 mL | Freq: Four times a day (QID) | ORAL | Status: DC | PRN

## 2011-11-01 MED ORDER — ALBUTEROL SULFATE HFA 108 (90 BASE) MCG/ACT IN AERS: 2.0000 | INHALATION_SPRAY | Freq: Four times a day (QID) | RESPIRATORY_TRACT | Status: DC | PRN

## 2011-11-01 MED ORDER — NITROGLYCERIN 0.4 MG SL SUBL
0.4000 mg | SUBLINGUAL_TABLET | SUBLINGUAL | Status: DC | PRN
  Administered 2011-11-01 (×3): 0.4 mg via SUBLINGUAL
  Filled 2011-11-01: qty 25

## 2011-11-01 MED ORDER — FUROSEMIDE 40 MG PO TABS
40.0000 mg | ORAL_TABLET | Freq: Every day | ORAL | Status: DC
  Administered 2011-11-01: 40 mg via ORAL
  Filled 2011-11-01 (×2): qty 1

## 2011-11-01 MED ORDER — SODIUM CHLORIDE 0.9 % IV SOLN: 250.0000 mL | INTRAVENOUS | Status: DC | PRN

## 2011-11-01 MED ORDER — FERROUS SULFATE 325 (65 FE) MG PO TABS
325.0000 mg | ORAL_TABLET | Freq: Two times a day (BID) | ORAL | Status: DC
  Administered 2011-11-01 – 2011-11-03 (×5): 325 mg via ORAL
  Filled 2011-11-01 (×6): qty 1

## 2011-11-01 MED ORDER — ENOXAPARIN SODIUM 150 MG/ML ~~LOC~~ SOLN
1.0000 mg/kg | Freq: Two times a day (BID) | SUBCUTANEOUS | Status: DC
  Filled 2011-11-01 (×2): qty 1

## 2011-11-01 NOTE — ED Provider Notes (Signed)
History     CSN: [id number]  Arrival date & time 11/01/11  0040   First MD Initiated Contact with Patient 11/01/11 [phone]      Chief Complaint  Patient presents with  . Chest Pain  . Shortness of Breath    (Consider location/radiation/quality/duration/timing/severity/associated sxs/prior treatment) Patient is a 46 y.o. female presenting with chest pain and shortness of breath. The history is provided by the patient.  Chest Pain Primary symptoms include shortness of breath.    Shortness of Breath  Associated symptoms include chest pain and shortness of breath.  She had onset at about 1200 of a left-sided chest heaviness associated with some dyspnea. Symptoms were severe and she rated the heaviness at 10 out of 10. It has subsided slightly and is now 9/10. She did take two 81 mg aspirin tablets, but has not taken any other medication for the chest heaviness or dyspnea. There is no radiation of her pain. Symptoms are not affected by deep breathing, laying down, standing up, or walking. She denies fever, chills. She has had nausea and sweating. Similar symptoms in the past were due to fluid buildup, and she has noted some increased swelling in her ankles recently. Symptoms are described as severe.  Past Medical History  Diagnosis Date  . HTN (hypertension)   . Fatigue   . CHF (congestive heart failure) 04/2011  . DOE (dyspnea on exertion)   . Orthopnea   . DVT (deep venous thrombosis) 06/2011    Past Surgical History  Procedure Date  . Tubal ligation 2006  . Cyst removed from ovary     No family history on file.  History  Substance Use Topics  . Smoking status: Former Smoker -- 0.5 packs/day for 5 years    Types: Cigarettes    Quit date: 10/19/1993  . Smokeless tobacco: Not on file  . Alcohol Use: No    OB History    Grav Para Term Preterm Abortions TAB SAB Ect Mult Living                  Review of Systems  Respiratory: Positive for shortness of breath.     Cardiovascular: Positive for chest pain.  All other systems reviewed and are negative.    Allergies  Detrol  Home Medications   Current Outpatient Rx  Name Route Sig Dispense Refill  . ALBUTEROL SULFATE HFA 108 (90 BASE) MCG/ACT IN AERS Inhalation Inhale 2 puffs into the lungs every 6 (six) hours as needed. For shortness of breath.    . ASPIRIN 81 MG PO TABS Oral Take 81 mg by mouth daily.      Marland Kitchen CARVEDILOL 6.25 MG PO TABS Oral Take 6.25 mg by mouth 2 (two) times daily.      Marland Kitchen DIGOXIN 0.25 MG PO TABS Oral Take 250 mcg by mouth daily.      . IRON 325 (65 FE) MG PO TABS Oral Take 1 tablet by mouth 2 (two) times daily.      . FUROSEMIDE 40 MG PO TABS Oral Take 40 mg by mouth daily.    Marland Kitchen HYDRALAZINE HCL 50 MG PO TABS Oral Take 50 mg by mouth 3 (three) times daily.      . ISOSORBIDE MONONITRATE ER 60 MG PO TB24 Oral Take 60 mg by mouth daily.      . WARFARIN SODIUM 10 MG PO TABS Oral Take 10 mg by mouth daily.        BP 140/76  Pulse 72  Temp(Src) 98.2 F (36.8 C) (Oral)  Resp 20  Ht 5\' 2"  (1.575 m)  Wt 235 lb (106.595 kg)  BMI 42.98 kg/m2  SpO2 100%  LMP 10/24/2011  Physical Exam  Nursing note and vitals reviewed.  46 year old female is resting comfortably and in no acute distress. Vital signs are normal and oxygen saturation is 100% which is normal. She is obese. Head is normocephalic and atraumatic. PERRLA, EOMI. Oropharynx is clear. Neck is supple without adenopathy, JVD, or tenderness. Back is nontender with no presacral edema and detected. Lungs have decreased breath sounds at the bases but no rales, wheezes, or rhonchi. Heart has regular rate and rhythm without murmur. Abdomen is soft, flat, nontender without masses or hepatosplenomegaly. Extremities have 2+ edema, no cyanosis. There is full range of motion present. Skin is warm and moist without rash. Neurologic: Mental status is normal, cranial nerves are intact, there no focal motor or sensory deficits. Psychiatric: No  abnormalities in mood or affect.  ED Course  Procedures (including critical care time)   Labs Reviewed  CBC  BASIC METABOLIC PANEL  PRO B NATRIURETIC PEPTIDE  CARDIAC PANEL(CRET KIN+CKTOT+MB+TROPI)  PROTIME-INR   Dg Chest 2 View  11/01/2011  *RADIOLOGY REPORT*  Clinical Data: Shortness of breath, left chest pain, history hypertension, CHF  CHEST - 2 VIEW  Comparison: 09/27/2011  Findings: Borderline enlargement of cardiac silhouette. Mediastinal contours and pulmonary vascularity normal. Lungs clear. No pleural effusion or pneumothorax. No acute osseous findings.  IMPRESSION: No acute abnormalities.  Original Report Authenticated By: Lollie Marrow, M.D.     No diagnosis found.  ECG shows normal sinus rhythm with a rate of 72, no ectopy. Normal axis. Normal P wave. Normal QRS. Normal intervals. Normal ST and T waves. Impression: normal ECG. When compared with ECG of 09/27/2011, no significant changes are seen.  She had significant relief of her chest pain with nitroglycerin. Workup has come back negative for significant congestive heart failure and her heart enzymes were normal. However, hemoglobin has progressively dropped. INR has come back markedly subtherapeutic. She will need to be admitted for consideration for blood transfusion. In light of subtherapeutic INR, she also needs to be evaluated for pulmonary embolism. CT angiogram is ordered. Consultation has been obtained with Dr. Adela Glimpse of [facility] who agrees to admit the patient.  MDM  Chest pain and dyspnea which may be related to congestive heart failure. She does have a history of DVT and possibility of pulmonary embolism needs to be considered in spite of her being on warfarin. She will also need cardiac markers to rule out acute cardiac injury. Her prior ED and office visit charts have been reviewed and it is noted she was in the ED in December for her chest wall pain, and had been evaluated by Dr. Shelle Iron for exertional  dyspnea in October.        Dione Booze, MD 11/01/11 [phone]

## 2011-11-01 NOTE — Progress Notes (Signed)
ANTICOAGULATION CONSULT NOTE - Initial Consult  Pharmacy Consult for Warfarin Indication: Hx of DVT  Allergies  Allergen Reactions  . Detrol (Tolterodine Tartrate)     rash    Patient Measurements: Height: 5\' 2"  (157.5 cm) Weight: 235 lb (106.595 kg) IBW/kg (Calculated) : 50.1   Vital Signs: BP: 151/67 mmHg (01/13 1038) Pulse Rate: 79  (01/13 1038)  Labs:  [facility] 11/01/11 0950 11/01/11 0215  HGB -- 7.5*  HCT -- 22.8*  PLT -- 273  APTT -- --  LABPROT -- 14.4  INR -- 1.10  HEPARINUNFRC -- --  CREATININE -- 0.91  CKTOTAL 107 112  CKMB 1.7 1.8  TROPONINI <0.30 <0.30   Estimated Creatinine Clearance: 91.5 ml/min (by C-G formula based on Cr of 0.91).  Medical History: Past Medical History  Diagnosis Date  . HTN (hypertension)   . Fatigue   . CHF (congestive heart failure) 04/2011  . DOE (dyspnea on exertion)   . Orthopnea   . DVT (deep venous thrombosis) 06/2011  . Anemia     Medications:  Scheduled:    . aspirin  324 mg Oral Once  . carvedilol  6.25 mg Oral BID  . digoxin  250 mcg Oral Daily  . enoxaparin (LOVENOX) injection  110 mg Subcutaneous Q12H  . ferrous sulfate  325 mg Oral BID WC  . furosemide  40 mg Intravenous Once  . furosemide  40 mg Oral Daily  . hydrALAZINE  50 mg Oral TID  . isosorbide mononitrate  60 mg Oral Daily  . pantoprazole  40 mg Oral Q1200  . DISCONTD: enoxaparin  1 mg/kg Subcutaneous Q12H  . DISCONTD: Iron  1 tablet Oral BID   Infusions:   PRN: sodium chloride, acetaminophen, acetaminophen, albuterol, albuterol, guaiFENesin-dextromethorphan, iohexol, nitroGLYCERIN, ondansetron (ZOFRAN) IV, ondansetron  Assessment: 46 yo F supposedly on warfarin prior to admit for hx of DVT, but admit INR subtherapeutic (1.1). Has been started on therapeutic Lovenox per MD. Home dose reported as 10mg  PO daily, reportedly taken on 1/13 already. Will give additional dose tonight.  Goal of Therapy:  INR 2-3   Plan:  1) Warfarin 5mg  PO x1 at  1800 2) F/U daily INR  Annia Belt 11/01/2011,1:17 PM

## 2011-11-01 NOTE — ED Notes (Signed)
Pt reports she has been "out of my Lasix for a few days"

## 2011-11-01 NOTE — H&P (Signed)
PCP:   Georganna Skeans, MD, MD    Chief Complaint:  Shortness of breath and chest pain  HPI: [name] is a 46 y.o. female   has a past medical history of HTN (hypertension); Fatigue; CHF (congestive heart failure) (04/2011); DOE (dyspnea on exertion); Orthopnea; and DVT (deep venous thrombosis) (06/2011).   Presented with  Shortness of breath since Saturday noon and chest pain that is dull, not changed by breathing, position or exertion. It has been persistent but  somewhat subsided. So far negative cardiac markers. Patient states she gets this pain on occasion when she has episodes of heart failure. States her legs have been more swollen than usual.  She is able lay flat.  She denies any melena but have had heavy periods. States she has been taking all of her medications as prescribed. She has history of recent DVT for which she is on coumadin.  Review of Systems:    Pertinent positives include:chest pain, nausea, shortness of breath at rest.dyspnea on exertion,   Constitutional:  No weight loss, night sweats, Fevers, chills, fatigue.  HEENT:  No headaches, Difficulty swallowing,Tooth/dental problems,Sore throat,  No sneezing, itching, ear ache, nasal congestion, post nasal drip,  Cardio-vascular:  No Orthopnea, PND, anasarca, dizziness, palpitations.no Bilateral lower extremity swelling  GI:  No heartburn, indigestion, abdominal pain,  vomiting, diarrhea, change in bowel habits, loss of appetite, melena, blood in stool, hematoemesis Resp:   No excess mucus, no productive cough, No non-productive cough, No coughing up of blood.No change in color of mucus.No wheezing.No chest wall deformity  Skin:  no rash or lesions.  GU:  no dysuria, change in color of urine, no urgency or frequency. No flank pain.  Musculoskeletal:  No joint pain or swelling. No decreased range of motion. No back pain.  Psych:  No change in mood or affect. No depression or anxiety. No memory loss.    Neuro: no localizing neurological complaints, no tingling, no weakness, no double vision, no gait abnormality, no slurred speech   Otherwise ROS are negative except for above, 10 systems were reviewed  Past Medical History: Past Medical History  Diagnosis Date  . HTN (hypertension)   . Fatigue   . CHF (congestive heart failure) 04/2011  . DOE (dyspnea on exertion)   . Orthopnea   . DVT (deep venous thrombosis) 06/2011   Past Surgical History  Procedure Date  . Tubal ligation 2006  . Cyst removed from ovary      Medications: Prior to Admission medications   Medication Sig Start Date End Date Taking? Authorizing Provider  albuterol (VENTOLIN HFA) 108 (90 BASE) MCG/ACT inhaler Inhale 2 puffs into the lungs every 6 (six) hours as needed. For shortness of breath.   Yes Historical Provider, MD  aspirin 81 MG tablet Take 81 mg by mouth daily.     Yes Historical Provider, MD  carvedilol (COREG) 6.25 MG tablet Take 6.25 mg by mouth 2 (two) times daily.     Yes Historical Provider, MD  digoxin (LANOXIN) 0.25 MG tablet Take 250 mcg by mouth daily.     Yes Historical Provider, MD  Ferrous Sulfate (IRON) 325 (65 FE) MG TABS Take 1 tablet by mouth 2 (two) times daily.     Yes Historical Provider, MD  furosemide (LASIX) 40 MG tablet Take 40 mg by mouth daily.   Yes Historical Provider, MD  hydrALAZINE (APRESOLINE) 50 MG tablet Take 50 mg by mouth 3 (three) times daily.     Yes Historical  Provider, MD  isosorbide mononitrate (IMDUR) 60 MG 24 hr tablet Take 60 mg by mouth daily.     Yes Historical Provider, MD  warfarin (COUMADIN) 10 MG tablet Take 10 mg by mouth daily.     Yes Historical Provider, MD    Allergies:   Allergies  Allergen Reactions  . Detrol (Tolterodine Tartrate)     rash    Social History:  Ambulatory independently Lives at home with family   reports that she quit smoking about 18 years ago. Her smoking use included Cigarettes. She has a 2.5 pack-year smoking history.  She does not have any smokeless tobacco history on file. She reports that she does not drink alcohol or use illicit drugs.   Family History: family history includes Hypertension in her mother and Stroke in her father.    Physical Exam: Patient Vitals for the past 24 hrs:  BP Temp Temp src Pulse Resp SpO2 Height Weight  11/01/11 0500 - - - 72  26  99 % - -  11/01/11 0445 - - - 68  19  100 % - -  11/01/11 0430 164/70 mmHg - - 75  20  100 % - -  11/01/11 0415 156/69 mmHg - - 69  23  100 % - -  11/01/11 0400 167/72 mmHg - - 72  24  100 % - -  11/01/11 0345 152/70 mmHg - - 71  25  100 % - -  11/01/11 0330 149/67 mmHg - - 73  25  100 % - -  11/01/11 0315 139/65 mmHg - - 72  23  100 % - -  11/01/11 0300 132/69 mmHg - - 77  18  99 % - -  11/01/11 0258 137/63 mmHg - - 76  20  99 % - -  11/01/11 0252 - - - - - 98 % - -  11/01/11 0248 - - - 74  - 97 % - -  11/01/11 0247 133/62 mmHg - - 81  - 97 % - -  11/01/11 0052 140/76 mmHg 98.2 F (36.8 C) Oral 72  20  100 % - -  11/01/11 0037 - - - - - - 5\' 2"  (1.575 m) 106.595 kg (235 lb)    1. General:  in No Acute distress 2. Psychological: Alert and Oriented 3. Head/ENT:   Moist  Mucous Membranes                          Head Non traumatic, neck supple                          Normal  Dentition 4. SKIN: normal  Skin turgor,  Skin clean Dry and intact no rash 5. Heart: Regular rate and rhythm no Murmur, Rub or gallop 6. Lungs: Clear to auscultation bilaterally, no wheezes or crackles  noted 7. Abdomen: Soft, non-tender, Non distended 8. Lower extremities: no clubbing, cyanosis, or edema 9. Neurologically Grossly intact, moving all 4 extremities equally 10. MSK: Normal range of motion  body mass index is 42.98 kg/(m^2).   Labs on Admission:   [facility] 11/01/11 0215  NA 140  K 3.8  CL 106  CO2 25  GLUCOSE 129*  BUN 13  CREATININE 0.91  CALCIUM 8.5  MG --  PHOS --   No results found for this basename:  AST:2,ALT:2,ALKPHOS:2,BILITOT:2,PROT:2,ALBUMIN:2 in the last 72 hours No results found for this basename: LIPASE:2,AMYLASE:2  in the last 72 hours  Basename 11/01/11 0215  WBC 5.7  NEUTROABS --  HGB 7.5*  HCT 22.8*  MCV 70.8*  PLT 273    Basename 11/01/11 0215  CKTOTAL 112  CKMB 1.8  CKMBINDEX --  TROPONINI <0.30   No results found for this basename: TSH,T4TOTAL,FREET3,T3FREE,THYROIDAB in the last 72 hours No results found for this basename: VITAMINB12:2,FOLATE:2,FERRITIN:2,TIBC:2,IRON:2,RETICCTPCT:2 in the last 72 hours Lab Results  Component Value Date   HGBA1C 6.7* 05/20/2011    Estimated Creatinine Clearance: 91.5 ml/min (by C-G formula based on Cr of 0.91). ABG    Component Value Date/Time   TCO2 26 09/26/2011 2224     Lab Results  Component Value Date   DDIMER 1.14* 07/17/2011     Other results:  I have pearsonaly reviewed this: ECG REPORT  Rate:72  Rhythm: Sinus rhythm ST&T Change: No ischemic changes  BNP 211  Cultures:    Component Value Date/Time   SDES URINE, CLEAN CATCH 06/09/2010 1134   SPECREQUEST NONE 06/09/2010 1134   CULT PROTEUS MIRABILIS 06/09/2010 1134   REPTSTATUS 06/11/2010 FINAL 06/09/2010 1134       Radiological Exams on Admission: Dg Chest 2 View  11/01/2011  *RADIOLOGY REPORT*  Clinical Data: Shortness of breath, left chest pain, history hypertension, CHF  CHEST - 2 VIEW  Comparison: 09/27/2011  Findings: Borderline enlargement of cardiac silhouette. Mediastinal contours and pulmonary vascularity normal. Lungs clear. No pleural effusion or pneumothorax. No acute osseous findings.  IMPRESSION: No acute abnormalities.  Original Report Authenticated By: Lollie Marrow, M.D.    Assessment/Plan  This is a 46 year old female with history of DVT on Coumadin but with a subtherapeutic INR presents with shortness of breath and chest pain which is fairly atypical. She is also found to be anemic with hemoglobin of 7.5  Present on Admission:    .Chest pain - etiology unclear currently patient undergoing a CT of the chest to rule out PE, - given risk factors will admit, monitor on telemetry, cycle cardiac enzymes, obtain serial ECG. Further risk stratify with lipid panel, hgA1C, obtain TSH. Make sure patient is on Aspirin. Further treatment based on the currently pending results.   .Shortness of breath - possibly multifactorial patient is anemic. Will have started for pulmonary embolism. No evidence of heart failure per chest x-ray are physical exam  .DVT (deep venous thrombosis) - patient has a history of DVT since September. She is subtherapeutic on her Coumadin unfortunately she is also developing worsening anemia. She has known history of iron deficiency anemia and heavy menses no past history of GI blood loss. He patient was found to have pulmonary embolism she probably would benefit from IVC filter. For now we'll cover her with Lovenox this can be  discontinued if significant bleeding noted and monitor hemoglobin closely. .Anemia - will Hemoccult stool obtain anemia panel, transfuse if becomes necessary. If patient is Hemoccult positive from below will need GI workup .Hypertension - continue home medication  .CHF (congestive heart failure) - currently appears to be stable does not appear to be significantly  fluid overloaded at this point, will check digoxin level     Prophylaxis:Lovenox, Protonix  CODE STATUS: full code   Dayvin Aber 11/01/2011, 5:25 AM

## 2011-11-01 NOTE — ED Notes (Signed)
Patient states that she feels like she is having fluid build up, chest pain and it is hard for her to breath

## 2011-11-01 NOTE — ED Notes (Signed)
Report received from previous RN. Pt moved to TCU room 27, first contact with patient. Pt is alert, and oriented at this time. Daughter at bedside. NT is in room putting patient on Tele right now

## 2011-11-01 NOTE — Progress Notes (Signed)
H&P reviewed, patient seen and examined complain of chest pressure and feeling a having fluid buildup. She stated that she ran out of her Lasix as an outpatient and now she feels swollen. She is complaining of chest pressure but no chest pain. Chart reviewed, CT angiogram of the chest showed no PE. Cardiac enzymes are unremarkable, pro BNP is elevated, INR 1.7 Plan Start Lasix 40 mg IV daily Start  Coumadin and ask pharmacy to dose, continue full dose Lovenox. Check 2-D echocardiogram Follow stool for occult blood and anemia panel Rest of plan of care as the H&P.

## 2011-11-01 NOTE — ED Notes (Addendum)
Pt reports her chest pressure started yesterday noon, the nitroglycerin that was given in ED helped the pain but not the pressure. Pt currently still has the pressure on her chest. Pt reports has CHF and always have chest pressure when her fluid built up. Pt came to ED knowing that she may have possible fluid overload. Pt appeared comfortable at this moment. Denied any needs. Medicated with morning meds.

## 2011-11-02 ENCOUNTER — Other Ambulatory Visit: Payer: Self-pay

## 2011-11-02 ENCOUNTER — Encounter (HOSPITAL_COMMUNITY): Payer: Self-pay | Admitting: Family Medicine

## 2011-11-02 LAB — IRON AND TIBC
Saturation Ratios: 13 % — ABNORMAL LOW (ref 20–55)
TIBC: 320 ug/dL (ref 250–470)

## 2011-11-02 LAB — COMPREHENSIVE METABOLIC PANEL
CO2: 28 mEq/L (ref 19–32)
Calcium: 8.9 mg/dL (ref 8.4–10.5)
Creatinine, Ser: 0.92 mg/dL (ref 0.50–1.10)
GFR calc Af Amer: 87 mL/min — ABNORMAL LOW (ref >90)
GFR calc non Af Amer: 75 mL/min — ABNORMAL LOW (ref >90)
Glucose, Bld: 118 mg/dL — ABNORMAL HIGH (ref 70–99)

## 2011-11-02 LAB — VITAMIN B12: Vitamin B-12: 454 pg/mL (ref 211–911)

## 2011-11-02 LAB — CBC
Hemoglobin: 8.7 g/dL — ABNORMAL LOW (ref 12.0–15.0)
RBC: 3.79 MIL/uL — ABNORMAL LOW (ref 3.87–5.11)

## 2011-11-02 LAB — FOLATE: Folate: 18.2 ng/mL

## 2011-11-02 LAB — PROTIME-INR
INR: 1.17 (ref 0.00–1.49)
Prothrombin Time: 15.1 seconds (ref 11.6–15.2)

## 2011-11-02 LAB — TSH: TSH: 0.646 u[IU]/mL (ref 0.350–4.500)

## 2011-11-02 MED ORDER — HYDROCHLOROTHIAZIDE 12.5 MG PO CAPS
12.5000 mg | ORAL_CAPSULE | Freq: Every day | ORAL | Status: DC
  Administered 2011-11-03: 12.5 mg via ORAL
  Filled 2011-11-02: qty 1

## 2011-11-02 MED ORDER — LISINOPRIL 20 MG PO TABS
20.0000 mg | ORAL_TABLET | Freq: Every day | ORAL | Status: DC
  Administered 2011-11-02 – 2011-11-03 (×2): 20 mg via ORAL
  Filled 2011-11-02 (×2): qty 1

## 2011-11-02 MED ORDER — WARFARIN SODIUM 7.5 MG PO TABS
15.0000 mg | ORAL_TABLET | Freq: Once | ORAL | Status: AC
  Administered 2011-11-02: 15 mg via ORAL
  Filled 2011-11-02: qty 2

## 2011-11-02 MED ORDER — HYDROCHLOROTHIAZIDE 25 MG PO TABS
12.5000 mg | ORAL_TABLET | Freq: Every day | ORAL | Status: DC
  Filled 2011-11-02: qty 0.5

## 2011-11-02 MED ORDER — POTASSIUM CHLORIDE CRYS ER 20 MEQ PO TBCR
40.0000 meq | EXTENDED_RELEASE_TABLET | Freq: Once | ORAL | Status: AC
  Administered 2011-11-02: 40 meq via ORAL
  Filled 2011-11-02: qty 2

## 2011-11-02 NOTE — Consult Note (Signed)
Cardiology Consult Note Reason for Consult: Chest pain Referring Physician: Toya Smothers, NP  [name] is an 46 y.o. female.  HPI: The patient is a 46 year old female with a past history notable for hypertension and combined systolic and diastolic heart failure accompanied by morbid obesity who was recently diagnosed with a deep venous thrombosis of the lower extremities. The patient presented to the emergency department on 11/01/2011 with history of chest pain and pressure. The patient reports that she had been asymptomatic until Saturday morning. She reports that she has been on a 16 ounce fluid restriction due to her CHF but had not done a very good job of following it on both Friday and Saturday. She reports that her chest pain began Saturday (2 days ago) late morning. The pain was on the left side of her chest and radiated to her back. It was not worsened by exertion, was not accompanied by any shortness of breath or diaphoresis, and was relieved by nothing. She initially felt this pain was just gas pain and ignored it. She continued to drink more than her prescribed amount throughout the day and, in the late evening developed central chest pressure, cough, and shortness of breath. These did not improve throughout the remainder of the evening and early Sunday morning she presented to the emergency department.  Because of her recent history of a DVT, the diagnosis of pulmonary embolism was entertained. CTA was performed and was negative for acute pulmonary process. Troponins were also cycled and were unremarkable.  EKG obtained in the emergency department was sinus rhythm, rate of 72, normal axis, and no acute ST segment changes. As the patient does have a history of CHF with fluid overload (initially diagnosed in July 2012), the patient was given diuresis with IV Lasix, and experienced resolution of her symptoms, although review of her chart does not demonstrate significant diuresis.  (Pt charted  as being net negative 420cc since admission.)   The patient remains asymptomatic at this point in time. She denies any active complaints, any chest pain, pressure, shortness of breath, visual changes, headaches, cough, lower extremity swelling, or any GI upset.  Past Medical History  Diagnosis Date  . HTN (hypertension)   . Fatigue   . CHF (congestive heart failure) 04/2011    EF 15-20% from echo 05/19/11  . DOE (dyspnea on exertion)   . Orthopnea   . DVT (deep venous thrombosis) 06/2011  . Anemia   . Menorrhagia     with iron deficient anemia    Past Surgical History  Procedure Date  . Tubal ligation 2006  . Cyst removed from ovary     Family History  Problem Relation Age of Onset  . Hypertension Mother   . Stroke Father     Social History:  reports that she quit smoking about 18 years ago. Her smoking use included Cigarettes. She has a 2.5 pack-year smoking history. She does not have any smokeless tobacco history on file. She reports that she does not drink alcohol or use illicit drugs.  Allergies:  Allergies  Allergen Reactions  . Detrol (Tolterodine Tartrate)     rash    Medications: I have reviewed the patient's current medications.  Results for orders placed during the hospital encounter of 11/01/11 (from the past 48 hour(s))  CBC     Status: Abnormal   Collection Time   11/01/11  2:15 AM      Component Value Range Comment   WBC 5.7  4.0 -  10.5 (K/uL)    RBC 3.22 (*) 3.87 - 5.11 (MIL/uL)    Hemoglobin 7.5 (*) 12.0 - 15.0 (g/dL)    HCT 54.0 (*) 98.1 - 46.0 (%)    MCV 70.8 (*) 78.0 - 100.0 (fL)    MCH 23.3 (*) 26.0 - 34.0 (pg)    MCHC 32.9  30.0 - 36.0 (g/dL)    RDW 19.1 (*) 47.8 - 15.5 (%)    Platelets 273  150 - 400 (K/uL)   BASIC METABOLIC PANEL     Status: Abnormal   Collection Time   11/01/11  2:15 AM      Component Value Range Comment   Sodium 140  135 - 145 (mEq/L)    Potassium 3.8  3.5 - 5.1 (mEq/L)    Chloride 106  96 - 112 (mEq/L)    CO2 25  19 -  32 (mEq/L)    Glucose, Bld 129 (*) 70 - 99 (mg/dL)    BUN 13  6 - 23 (mg/dL)    Creatinine, Ser 2.95  0.50 - 1.10 (mg/dL)    Calcium 8.5  8.4 - 10.5 (mg/dL)    GFR calc non Af Amer 76 (*) >90 (mL/min)    GFR calc Af Amer 88 (*) >90 (mL/min)   PRO B NATRIURETIC PEPTIDE     Status: Abnormal   Collection Time   11/01/11  2:15 AM      Component Value Range Comment   Pro B Natriuretic peptide (BNP) 211.3 (*) 0 - 125 (pg/mL)   CARDIAC PANEL(CRET KIN+CKTOT+MB+TROPI)     Status: Normal   Collection Time   11/01/11  2:15 AM      Component Value Range Comment   Total CK 112  7 - 177 (U/L)    CK, MB 1.8  0.3 - 4.0 (ng/mL)    Troponin I <0.30  <0.30 (ng/mL)    Relative Index 1.6  0.0 - 2.5    PROTIME-INR     Status: Normal   Collection Time   11/01/11  2:15 AM      Component Value Range Comment   Prothrombin Time 14.4  11.6 - 15.2 (seconds)    INR 1.10  0.00 - 1.49    CARDIAC PANEL(CRET KIN+CKTOT+MB+TROPI)     Status: Normal   Collection Time   11/01/11  9:50 AM      Component Value Range Comment   Total CK 107  7 - 177 (U/L)    CK, MB 1.7  0.3 - 4.0 (ng/mL)    Troponin I <0.30  <0.30 (ng/mL)    Relative Index 1.6  0.0 - 2.5    TYPE AND SCREEN     Status: Normal   Collection Time   11/01/11  9:50 AM      Component Value Range Comment   ABO/RH(D) O POS      Antibody Screen NEG      Sample Expiration 11/04/2011     ABO/RH     Status: Normal   Collection Time   11/01/11  9:50 AM      Component Value Range Comment   ABO/RH(D) O POS     CARDIAC PANEL(CRET KIN+CKTOT+MB+TROPI)     Status: Normal   Collection Time   11/01/11  2:35 PM      Component Value Range Comment   Total CK 105  7 - 177 (U/L)    CK, MB 1.6  0.3 - 4.0 (ng/mL)    Troponin I <0.30  <0.30 (ng/mL)  Relative Index 1.5  0.0 - 2.5    CBC     Status: Abnormal   Collection Time   11/01/11  3:12 PM      Component Value Range Comment   WBC 5.6  4.0 - 10.5 (K/uL)    RBC 3.86 (*) 3.87 - 5.11 (MIL/uL)    Hemoglobin 8.9 (*)  12.0 - 15.0 (g/dL)    HCT 40.9 (*) 81.1 - 46.0 (%)    MCV 70.7 (*) 78.0 - 100.0 (fL)    MCH 23.1 (*) 26.0 - 34.0 (pg)    MCHC 32.6  30.0 - 36.0 (g/dL)    RDW 91.4 (*) 78.2 - 15.5 (%)    Platelets 308  150 - 400 (K/uL)   CBC     Status: Abnormal   Collection Time   11/01/11  8:00 PM      Component Value Range Comment   WBC 5.2  4.0 - 10.5 (K/uL)    RBC 3.74 (*) 3.87 - 5.11 (MIL/uL)    Hemoglobin 8.7 (*) 12.0 - 15.0 (g/dL)    HCT 95.6 (*) 21.3 - 46.0 (%)    MCV 70.9 (*) 78.0 - 100.0 (fL)    MCH 23.3 (*) 26.0 - 34.0 (pg)    MCHC 32.8  30.0 - 36.0 (g/dL)    RDW 08.6 (*) 57.8 - 15.5 (%)    Platelets 309  150 - 400 (K/uL)   PROTIME-INR     Status: Normal   Collection Time   11/02/11  5:18 AM      Component Value Range Comment   Prothrombin Time 15.1  11.6 - 15.2 (seconds)    INR 1.17  0.00 - 1.49    MAGNESIUM     Status: Normal   Collection Time   11/02/11  5:18 AM      Component Value Range Comment   Magnesium 1.8  1.5 - 2.5 (mg/dL)   PHOSPHORUS     Status: Normal   Collection Time   11/02/11  5:18 AM      Component Value Range Comment   Phosphorus 3.9  2.3 - 4.6 (mg/dL)   TSH     Status: Normal   Collection Time   11/02/11  5:18 AM      Component Value Range Comment   TSH 0.646  0.350 - 4.500 (uIU/mL)   COMPREHENSIVE METABOLIC PANEL     Status: Abnormal   Collection Time   11/02/11  5:18 AM      Component Value Range Comment   Sodium 138  135 - 145 (mEq/L)    Potassium 3.5  3.5 - 5.1 (mEq/L)    Chloride 102  96 - 112 (mEq/L)    CO2 28  19 - 32 (mEq/L)    Glucose, Bld 118 (*) 70 - 99 (mg/dL)    BUN 12  6 - 23 (mg/dL)    Creatinine, Ser 4.69  0.50 - 1.10 (mg/dL)    Calcium 8.9  8.4 - 10.5 (mg/dL)    Total Protein 7.1  6.0 - 8.3 (g/dL)    Albumin 3.2 (*) 3.5 - 5.2 (g/dL)    AST 11  0 - 37 (U/L)    ALT 16  0 - 35 (U/L)    Alkaline Phosphatase 77  39 - 117 (U/L)    Total Bilirubin 0.1 (*) 0.3 - 1.2 (mg/dL)    GFR calc non Af Amer 75 (*) >90 (mL/min)    GFR calc Af Amer 87  (*) >90 (mL/min)  CBC     Status: Abnormal   Collection Time   11/02/11  5:18 AM      Component Value Range Comment   WBC 5.7  4.0 - 10.5 (K/uL)    RBC 3.79 (*) 3.87 - 5.11 (MIL/uL)    Hemoglobin 8.7 (*) 12.0 - 15.0 (g/dL)    HCT 16.1 (*) 09.6 - 46.0 (%)    MCV 70.4 (*) 78.0 - 100.0 (fL)    MCH 23.0 (*) 26.0 - 34.0 (pg)    MCHC 32.6  30.0 - 36.0 (g/dL)    RDW 04.5 (*) 40.9 - 15.5 (%)    Platelets 299  150 - 400 (K/uL)   RETICULOCYTES     Status: Normal   Collection Time   11/02/11  2:03 PM      Component Value Range Comment   Retic Ct Pct 1.7  0.4 - 3.1 (%)    RBC. 3.91  3.87 - 5.11 (MIL/uL)    Retic Count, Manual 66.5  19.0 - 186.0 (K/uL)   DIGOXIN LEVEL     Status: Normal   Collection Time   11/02/11  2:03 PM      Component Value Range Comment   Digoxin Level 1.2  0.8 - 2.0 (ng/mL)     Dg Chest 2 View  11/01/2011  *RADIOLOGY REPORT*  Clinical Data: Shortness of breath, left chest pain, history hypertension, CHF  CHEST - 2 VIEW  Comparison: 09/27/2011  Findings: Borderline enlargement of cardiac silhouette. Mediastinal contours and pulmonary vascularity normal. Lungs clear. No pleural effusion or pneumothorax. No acute osseous findings.  IMPRESSION: No acute abnormalities.  Original Report Authenticated By: Lollie Marrow, M.D.   Ct Angio Chest W/cm &/or Wo Cm  11/01/2011  *RADIOLOGY REPORT*  Clinical Data: Chest pain and shortness of breath.  CT ANGIOGRAPHY CHEST  Technique:  Multidetector CT imaging of the chest using the standard protocol during bolus administration of intravenous contrast. Multiplanar reconstructed images including MIPs were obtained and reviewed to evaluate the vascular anatomy.  Contrast: OMNIPAQUE IOHEXOL 300 MG/ML IV SOLN  Comparison: Chest radiograph performed earlier today at 01:11 a.m., and CTA of the chest performed 07/17/2011  Findings: There is no evidence of pulmonary embolus.  Minimal bibasilar atelectasis is noted; the lungs are otherwise clear.   There is no evidence of significant focal consolidation, pleural effusion or pneumothorax.  No masses are identified; no abnormal focal contrast enhancement is seen.  Mild scattered coronary artery calcifications are noted; the mediastinum is otherwise unremarkable in appearance.  No pericardial effusion is seen.  No mediastinal lymphadenopathy is appreciated.  The great vessels are within normal limits.  No axillary lymphadenopathy is seen.  The thyroid gland is unremarkable in appearance.  The visualized portions of the liver and spleen are unremarkable.  No acute osseous abnormalities are seen.  IMPRESSION:  1.  No evidence of pulmonary embolus. 2.  Minimal bibasilar atelectasis noted; lungs otherwise clear. 3.  Mild scattered coronary artery calcifications seen.  Original Report Authenticated By: Tonia Ghent, M.D.    ROS Per HPI.  No visual changes, headache, cough, chest pain, GI upset, bleeding, bruising, LE swelling at this time. Blood pressure 156/81, pulse 75, temperature 97.9 F (36.6 C), temperature source Oral, resp. rate 18, height 5\' 2"  (1.575 m), weight 235 lb 14.3 oz (107 kg), last menstrual period 10/24/2011, SpO2 100.00%. Physical Exam General: Morbidly obese female, interactive and appropriate HEENT: MMM, PERRL, EOMI, no JVD although exam is somewhat limited by body habitus,  trachea midline CV: RRR, II/VI systolic ejection murmur heard best at LUSB, no gallop Resp: CTABL, no crackles heard, breath sounds slightly diminished throughout secondary to body habitus, no focal findings Abd: SNTND, +BS all 4 quadrents Ext: No LE edema, 2+ DP, popliteal and radial pulses  Assessment/Plan: 1) CHF, combined systolic/diastolic (last known EF 15-20%): Resolved. EF now normal by echo done 11/02/11. Moderate LVH noted.    2) Atypical chest pain without enzyme leak or significant EKG changes. Most likely musculoskeletal or GERD.  Pt has ruled out for AMI and is currently symptom free. Do not  feel that this patient requires further cardiac workup this admission.  If she remains symptom free overnight and no new/concerning findings on echo would be OK to d/c in the AM.  Patient will be set up for lexiscan as an OP in the near future  3) Cardiomyopathy: felt to be non-ischemic and related to weight and poorly controlled blood pressure.  Echo pending.  If no significant change from previous, will continue with medical management.  Pt is scheduled for lexiscan as an OP to rule out any ischemic component.  3) HTN: Relatively poorly controlled, goal bp 130/80.  On Imdur 60mg  daily and hydralazine 50mg  TID along with carvedilol 6.25mg  BID.  Patient was on 25mg  of carvedilol BID at her last office visit with Dr. Jacinto Halim.  It would be OK to try increasing her current dose to at least 12.5mg  BID as it would appear that her blood pressure and heart rate would tolerate it.  Pt has also been on ACEI in the past (which was stopped in her hospitalization 9/12 due to AKI secondary to dehydration).  This would certainly be a possibility to add as an additional agent if heart rate does not tolerate increased BB.  5) Morbid obesity: Has received dietary counciling as an OP.  Have continued to stress the importance of weight loss and exercise to her future health and well being.  6) Anemia, iron deficient, related to menorrhagia:  On oral iron replacement.  Agree with seeing a gynecologist as an OP.  Pt may be a good candidate for an IUD for control of bleeding if there is no evidence of atypical endometrial cells.  7) Recent DVT during hospital admit on coumadin  (dx Sept 2012):  Claims compliance with coumadin.  INR significantly low on admission.  Will need close OP follow up  monitor INR.  Do not feel that she requires bridging as there is no acute event here.  Pt will need to be on coumadin at least until March of this year (6 months anticoagulation for first incidence of DVT).  Thank you for involving [place] in  the care of your patient.  I have discussed this patient with Dr. Jacinto Halim who plans to see the patient later this evening.  [facility] Resident 11/02/2011, 3:14 PM   Please see my impression. OK to D/C patient home in the morning. Stop lasix and change to Lisinopril HCT. Patient has had hyperkalemia in the past due to ACEi plus aldactone. Shuold be able to tolerate this now. EF improved. Hypertensive heart disease is the etiology. Has coronary calcification by CT angio chest. Also stop digoxin. OV in 10 days and I will f/u labs.

## 2011-11-02 NOTE — Progress Notes (Signed)
  Echocardiogram 2D Echocardiogram has been performed.  Cathie Beams Deneen 11/02/2011, 3:14 PM

## 2011-11-02 NOTE — Progress Notes (Signed)
Subjective: "i am feeling better. No more SOB and very little chest tightness"   Objective: Vital signs Filed Vitals:   11/01/11 2225 11/02/11 0135 11/02/11 0501 11/02/11 1027  BP: 125/81 122/80 126/81 146/82  Pulse: 84 80 70 68  Temp: 98.4 F (36.9 C) 98 F (36.7 C) 98.6 F (37 C) 98.5 F (36.9 C)  TempSrc: Oral Oral Oral Oral  Resp: 20 18 18 18   Height:      Weight:   107 kg (235 lb 14.3 oz)   SpO2: 100% 98% 99% 96%   Weight change: 1.805 kg (3 lb 15.7 oz) Last BM Date: 11/01/11  Intake/Output from previous day: 01/13 0701 - 01/14 0700 In: 840 [P.O.:840] Out: 1100 [Urine:1100] Total I/O In: 120 [P.O.:120] Out: -    Physical Exam: General: Alert, awake, oriented x3, in no acute distress. HEENT: No bruits, no goiter. Heart: Regular rate and rhythm, without murmurs, rubs, gallops. Lungs: Clear to auscultation bilaterally. Abdomen: Soft, nontender, nondistended, positive bowel sounds. Extremities: No clubbing cyanosis or edema with positive pedal pulses. Neuro: Grossly intact, nonfocal.    Lab Results: Basic Metabolic Panel:  Basename 11/02/11 0518 11/01/11 0215  NA 138 140  K 3.5 3.8  CL 102 106  CO2 28 25  GLUCOSE 118* 129*  BUN 12 13  CREATININE 0.92 0.91  CALCIUM 8.9 8.5  MG 1.8 --  PHOS 3.9 --   Liver Function Tests:  Basename 11/02/11 0518  AST 11  ALT 16  ALKPHOS 77  BILITOT 0.1*  PROT 7.1  ALBUMIN 3.2*   No results found for this basename: LIPASE:2,AMYLASE:2 in the last 72 hours No results found for this basename: AMMONIA:2 in the last 72 hours CBC:  Basename 11/02/11 0518 11/01/11 2000  WBC 5.7 5.2  NEUTROABS -- --  HGB 8.7* 8.7*  HCT 26.7* 26.5*  MCV 70.4* 70.9*  PLT 299 309   Cardiac Enzymes:  Basename 11/01/11 1435 11/01/11 0950 11/01/11 0215  CKTOTAL 105 107 112  CKMB 1.6 1.7 1.8  CKMBINDEX -- -- --  TROPONINI <0.30 <0.30 <0.30   BNP:  Basename 11/01/11 0215  PROBNP 211.3*   D-Dimer: No results found for this  basename: DDIMER:2 in the last 72 hours CBG: No results found for this basename: GLUCAP:6 in the last 72 hours Hemoglobin A1C: No results found for this basename: HGBA1C in the last 72 hours Fasting Lipid Panel: No results found for this basename: CHOL,HDL,LDLCALC,TRIG,CHOLHDL,LDLDIRECT in the last 72 hours Thyroid Function Tests:  Basename 11/02/11 0518  TSH 0.646  T4TOTAL --  FREET4 --  T3FREE --  THYROIDAB --   Anemia Panel: No results found for this basename: VITAMINB12,FOLATE,FERRITIN,TIBC,IRON,RETICCTPCT in the last 72 hours Coagulation:  Basename 11/02/11 0518 11/01/11 0215  LABPROT 15.1 14.4  INR 1.17 1.10   Urine Drug Screen: Drugs of Abuse  No results found for this basename: labopia, cocainscrnur, labbenz, amphetmu, thcu, labbarb    Alcohol Level: No results found for this basename: ETH:2 in the last 72 hours Urinalysis:  Misc. Labs:  No results found for this or any previous visit (from the past 240 hour(s)).  Studies/Results: Dg Chest 2 View  11/01/2011  *RADIOLOGY REPORT*  Clinical Data: Shortness of breath, left chest pain, history hypertension, CHF  CHEST - 2 VIEW  Comparison: 09/27/2011  Findings: Borderline enlargement of cardiac silhouette. Mediastinal contours and pulmonary vascularity normal. Lungs clear. No pleural effusion or pneumothorax. No acute osseous findings.  IMPRESSION: No acute abnormalities.  Original Report Authenticated By: Loraine Leriche  A. Tyron Russell, M.D.   Ct Angio Chest W/cm &/or Wo Cm  11/01/2011  *RADIOLOGY REPORT*  Clinical Data: Chest pain and shortness of breath.  CT ANGIOGRAPHY CHEST  Technique:  Multidetector CT imaging of the chest using the standard protocol during bolus administration of intravenous contrast. Multiplanar reconstructed images including MIPs were obtained and reviewed to evaluate the vascular anatomy.  Contrast: OMNIPAQUE IOHEXOL 300 MG/ML IV SOLN  Comparison: Chest radiograph performed earlier today at 01:11 a.m., and  CTA of the chest performed 07/17/2011  Findings: There is no evidence of pulmonary embolus.  Minimal bibasilar atelectasis is noted; the lungs are otherwise clear.  There is no evidence of significant focal consolidation, pleural effusion or pneumothorax.  No masses are identified; no abnormal focal contrast enhancement is seen.  Mild scattered coronary artery calcifications are noted; the mediastinum is otherwise unremarkable in appearance.  No pericardial effusion is seen.  No mediastinal lymphadenopathy is appreciated.  The great vessels are within normal limits.  No axillary lymphadenopathy is seen.  The thyroid gland is unremarkable in appearance.  The visualized portions of the liver and spleen are unremarkable.  No acute osseous abnormalities are seen.  IMPRESSION:  1.  No evidence of pulmonary embolus. 2.  Minimal bibasilar atelectasis noted; lungs otherwise clear. 3.  Mild scattered coronary artery calcifications seen.  Original Report Authenticated By: Tonia Ghent, M.D.    Medications: Scheduled Meds:   . carvedilol  6.25 mg Oral BID  . digoxin  250 mcg Oral Daily  . enoxaparin (LOVENOX) injection  110 mg Subcutaneous Q12H  . ferrous sulfate  325 mg Oral BID WC  . furosemide  40 mg Intravenous Once  . furosemide  40 mg Oral Daily  . hydrALAZINE  50 mg Oral TID  . isosorbide mononitrate  60 mg Oral Daily  . pantoprazole  40 mg Oral Q1200  . sodium chloride  3 mL Intravenous Q12H  . warfarin  15 mg Oral ONCE-1800  . warfarin  5 mg Oral ONCE-1800  . DISCONTD: furosemide  40 mg Intravenous Daily  . DISCONTD: furosemide  40 mg Oral Daily   Continuous Infusions:  PRN Meds:.sodium chloride, acetaminophen, acetaminophen, albuterol, albuterol, alum & mag hydroxide-simeth, guaiFENesin-dextromethorphan, nitroGLYCERIN, ondansetron (ZOFRAN) IV, ondansetron, sodium chloride  Assessment/Plan:  Principal Problem: Chest pain - atypical. etiology unclear. PE ruled out per CT, cardiac enzymes neg  to date, hgA1C,pending, TSH 0.6. continue Aspirin.  Will request cardiology consult. Pt reports she believes related to fluid build up as she has had in past. Reports not being "able to follow my fluid restrictions" and not having lasix. No evidence of HF per chest xray and exam. Hg low concern demand ischemia.   Will continue tele. Marland KitchenShortness of breath - possibly multifactorial patient i.e. Acute on chronic HF vs anemia vs deconditioning. Much improved today. Will ambulate and monitor sat. Sats 96-100% on room air at rest.  .DVT (deep venous thrombosis) - patient has a history of DVT since September. She is subtherapeutic on her Coumadin unfortunately she is also developing worsening anemia. She has known history of iron deficiency anemia and heavy menses no past history of GI blood loss. He patient was found to have pulmonary embolism she probably would benefit from IVC filter. For now we'll cover her with Lovenox this can be discontinued if significant bleeding noted and monitor hemoglobin closely.  .Anemia - known hx of iron deficiency anemia with heavy menses. Anemia panel pending. will transfuse if becomes necessary. If patient is  Hemoccult positive from below will need GI workup  .Hypertension - fair control.  continue home medication  .CHF (congestive heart failure) - currently appears to be stable does not appear to be significantly fluid overloaded at this point. Wt. 107kg from108.4kg yesterday. Digoxin level pending. Continue med. Requested cardiology consult.      LOS: 1 day   [facility] M 11/02/2011, 12:59 PM

## 2011-11-02 NOTE — Progress Notes (Addendum)
Patient seen and examined, breathing improved with Lasix. Awaiting echocardiogram and cardiology evaluation. Anemia likely secondary to menorrhagia, still for occult blood pending. Patient will need to followup with a gynecologist on discharge. Continue Coumadin and Lovenox for now.

## 2011-11-02 NOTE — Progress Notes (Signed)
Patient ambulated on room air approximately 40 feet.  Oxygen saturation stayed between 95-100%.  Will continue to monitor patient.

## 2011-11-02 NOTE — Progress Notes (Signed)
ANTICOAGULATION CONSULT NOTE  Pharmacy Consult for Warfarin Indication: Hx of DVT  Allergies  Allergen Reactions  . Detrol (Tolterodine Tartrate)     rash    Patient Measurements: Height: 5\' 2"  (157.5 cm) Weight: 235 lb 14.3 oz (107 kg) IBW/kg (Calculated) : 50.1   Vital Signs: Temp: 98.6 F (37 C) (01/14 0501) Temp src: Oral (01/14 0501) BP: 126/81 mmHg (01/14 0501) Pulse Rate: 70  (01/14 0501)  Labs:  Basename 11/02/11 0518 11/01/11 2000 11/01/11 1512 11/01/11 1435 11/01/11 0950 11/01/11 0215  HGB 8.7* 8.7* -- -- -- --  HCT 26.7* 26.5* 27.3* -- -- --  PLT 299 309 308 -- -- --  APTT -- -- -- -- -- --  LABPROT 15.1 -- -- -- -- 14.4  INR 1.17 -- -- -- -- 1.10  HEPARINUNFRC -- -- -- -- -- --  CREATININE 0.92 -- -- -- -- 0.91  CKTOTAL -- -- -- 105 107 112  CKMB -- -- -- 1.6 1.7 1.8  TROPONINI -- -- -- <0.30 <0.30 <0.30   Estimated Creatinine Clearance: 90.7 ml/min (by C-G formula based on Cr of 0.92).  Medical History: Past Medical History  Diagnosis Date  . HTN (hypertension)   . Fatigue   . CHF (congestive heart failure) 04/2011  . DOE (dyspnea on exertion)   . Orthopnea   . DVT (deep venous thrombosis) 06/2011  . Anemia     Medications:  Scheduled:     . carvedilol  6.25 mg Oral BID  . digoxin  250 mcg Oral Daily  . enoxaparin (LOVENOX) injection  110 mg Subcutaneous Q12H  . ferrous sulfate  325 mg Oral BID WC  . furosemide  40 mg Intravenous Once  . furosemide  40 mg Oral Daily  . hydrALAZINE  50 mg Oral TID  . isosorbide mononitrate  60 mg Oral Daily  . pantoprazole  40 mg Oral Q1200  . sodium chloride  3 mL Intravenous Q12H  . warfarin  5 mg Oral ONCE-1800  . DISCONTD: enoxaparin  1 mg/kg Subcutaneous Q12H  . DISCONTD: furosemide  40 mg Intravenous Daily  . DISCONTD: furosemide  40 mg Oral Daily  . DISCONTD: Iron  1 tablet Oral BID  Infusions:   PRN: sodium chloride, acetaminophen, acetaminophen, albuterol, albuterol, alum & mag  hydroxide-simeth, guaiFENesin-dextromethorphan, nitroGLYCERIN, ondansetron (ZOFRAN) IV, ondansetron, sodium chloride Anticoagulant doses: PTA warfarin 10mg  daily Lovenox 1/13 >> Coumadin: reportedly took home dose 10mg  and 5mg  on 1/13  Assessment: 46 yo F on warfarin prior to admit for hx of DVT No evidence of PE on CT Anemia with stable/low H/H INR subtherapeutic on admission and again today Therapeutic Lovenox started per MD  Goal of Therapy:  INR 2-3   Plan:  1) Repeat boosted dose with warfarin 15mg  PO today at 1800 2) F/U daily INR, CBC  Lynann Beaver PharmD   Pager [phone] 11/02/2011 9:06 AM

## 2011-11-02 NOTE — Progress Notes (Signed)
   CARE MANAGEMENT NOTE 11/02/2011  Patient:  [name]   Account Number:  [id number]  Date Initiated:  11/02/2011  Documentation initiated by:  Lanier Clam  Subjective/Objective Assessment:   ADMITTED W/CHEST PAIN. HX:DVT,CHF     Action/Plan:   FROM HOME W/FAMILY   Anticipated DC Date:  11/04/2011   Anticipated DC Plan:  HOME/SELF CARE         Choice offered to / List presented to:             Status of service:  In process, will continue to follow Medicare Important Message given?   (If response is "NO", the following Medicare IM given date fields will be blank) Date Medicare IM given:   Date Additional Medicare IM given:    Discharge Disposition:    Per UR Regulation:  Reviewed for med. necessity/level of care/duration of stay  Comments:  11/01/13 [facility] RN,BSN [phone]

## 2011-11-03 LAB — CBC
MCHC: 33.1 g/dL (ref 30.0–36.0)
Platelets: 299 10*3/uL (ref 150–400)
RDW: 16.2 % — ABNORMAL HIGH (ref 11.5–15.5)

## 2011-11-03 LAB — PROTIME-INR
INR: 1.23 (ref 0.00–1.49)
Prothrombin Time: 15.8 seconds — ABNORMAL HIGH (ref 11.6–15.2)

## 2011-11-03 MED ORDER — LISINOPRIL 20 MG PO TABS: 20.0000 mg | ORAL_TABLET | Freq: Every day | ORAL | Status: DC

## 2011-11-03 MED ORDER — WARFARIN SODIUM 7.5 MG PO TABS: 15.0000 mg | ORAL_TABLET | Freq: Once | ORAL | Status: DC

## 2011-11-03 MED ORDER — WARFARIN SODIUM 7.5 MG PO TABS
15.0000 mg | ORAL_TABLET | Freq: Once | ORAL | Status: AC
  Administered 2011-11-03: 15 mg via ORAL
  Filled 2011-11-03: qty 2

## 2011-11-03 MED ORDER — HYDROCHLOROTHIAZIDE 12.5 MG PO CAPS: 12.5000 mg | ORAL_CAPSULE | Freq: Every day | ORAL | Status: DC

## 2011-11-03 MED ORDER — PANTOPRAZOLE SODIUM 40 MG PO TBEC: 40.0000 mg | DELAYED_RELEASE_TABLET | Freq: Every day | ORAL | Status: DC

## 2011-11-03 NOTE — Discharge Summary (Signed)
Physician Discharge Summary  Patient ID: ANDRENA MARGERUM MRN: [id number] DOB/AGE: [birth year] 46 y.o.  Admit date: 11/01/2011 Discharge date: 11/03/2011  Primary Care Physician:  Georganna Skeans, MD, MD   Discharge Diagnoses:    Present on Admission:  .Chest pain .Shortness of breath .DVT (deep venous thrombosis) .Hypertension .Diastolic heart failure secondary to hypertension .Anemia  Current Discharge Medication List    START taking these medications   Details  hydrochlorothiazide (MICROZIDE) 12.5 MG capsule Take 1 capsule (12.5 mg total) by mouth daily. Qty: 30 capsule, Refills: 0    lisinopril (PRINIVIL,ZESTRIL) 20 MG tablet Take 1 tablet (20 mg total) by mouth daily. Qty: 30 tablet, Refills: 0    pantoprazole (PROTONIX) 40 MG tablet Take 1 tablet (40 mg total) by mouth daily at 12 noon. Qty: 30 tablet, Refills: 0      CONTINUE these medications which have CHANGED   Details  warfarin (COUMADIN) 7.5 MG tablet Take 2 tablets (15 mg total) by mouth one time only at 6 PM. Qty: 1 tablet, Refills: 0      CONTINUE these medications which have NOT CHANGED   Details  albuterol (VENTOLIN HFA) 108 (90 BASE) MCG/ACT inhaler Inhale 2 puffs into the lungs every 6 (six) hours as needed. For shortness of breath.    aspirin 81 MG tablet Take 81 mg by mouth daily.      carvedilol (COREG) 6.25 MG tablet Take 6.25 mg by mouth 2 (two) times daily.      Ferrous Sulfate (IRON) 325 (65 FE) MG TABS Take 1 tablet by mouth 2 (two) times daily.      hydrALAZINE (APRESOLINE) 50 MG tablet Take 50 mg by mouth 3 (three) times daily.      isosorbide mononitrate (IMDUR) 60 MG 24 hr tablet Take 60 mg by mouth daily.        STOP taking these medications     digoxin (LANOXIN) 0.25 MG tablet      furosemide (LASIX) 40 MG tablet          Disposition and Follow-up:  Pt medically stable and ready for discharge to home. Will follow up with Dr Nadara Eaton in 10 days. Will have INR drawn at Dr Nadara Eaton  tomorrow. Follow up with PCP in 2 weeks. Will need apt with gyn   Consults:  cardiology   Physical exam: General: Alert, awake, oriented x3, in no acute distress. Sitting up in bed talking on phone HEENT: No bruits, no goiter. Mucus membranes mouth moist/pink. PERRL Heart: Regular rate and rhythm,without rub, gallops. +murmur Lungs: Normal effort. Breath sounds with fine crackles bases otherwise clear to auscultation bilaterally.  Abdomen: Obese Soft, nontender, nondistended, positive bowel sounds.  Extremities: No clubbing cyanosis or edema with positive pedal pulses.  Neuro: Grossly intact, nonfocal. Speech clear    Significant Diagnostic Studies:  Dg Chest 2 View  11/01/2011  *RADIOLOGY REPORT*  Clinical Data: Shortness of breath, left chest pain, history hypertension, CHF  CHEST - 2 VIEW  Comparison: 09/27/2011  Findings: Borderline enlargement of cardiac silhouette. Mediastinal contours and pulmonary vascularity normal. Lungs clear. No pleural effusion or pneumothorax. No acute osseous findings.  IMPRESSION: No acute abnormalities.  Original Report Authenticated By: Lollie Marrow, M.D.   Ct Angio Chest W/cm &/or Wo Cm  11/01/2011  *RADIOLOGY REPORT*  Clinical Data: Chest pain and shortness of breath.  CT ANGIOGRAPHY CHEST  Technique:  Multidetector CT imaging of the chest using the standard protocol during bolus administration of intravenous contrast. Multiplanar reconstructed images  including MIPs were obtained and reviewed to evaluate the vascular anatomy.  Contrast: OMNIPAQUE IOHEXOL 300 MG/ML IV SOLN  Comparison: Chest radiograph performed earlier today at 01:11 a.m., and CTA of the chest performed 07/17/2011  Findings: There is no evidence of pulmonary embolus.  Minimal bibasilar atelectasis is noted; the lungs are otherwise clear.  There is no evidence of significant focal consolidation, pleural effusion or pneumothorax.  No masses are identified; no abnormal focal contrast  enhancement is seen.  Mild scattered coronary artery calcifications are noted; the mediastinum is otherwise unremarkable in appearance.  No pericardial effusion is seen.  No mediastinal lymphadenopathy is appreciated.  The great vessels are within normal limits.  No axillary lymphadenopathy is seen.  The thyroid gland is unremarkable in appearance.  The visualized portions of the liver and spleen are unremarkable.  No acute osseous abnormalities are seen.  IMPRESSION:  1.  No evidence of pulmonary embolus. 2.  Minimal bibasilar atelectasis noted; lungs otherwise clear. 3.  Mild scattered coronary artery calcifications seen.  Original Report Authenticated By: Tonia Ghent, M.D.    Labs Reviewed  CBC - Abnormal; Notable for the following:    RBC 3.22 (*)    Hemoglobin 7.5 (*)    HCT 22.8 (*)    MCV 70.8 (*)    MCH 23.3 (*)    RDW 16.2 (*)    All other components within normal limits  BASIC METABOLIC PANEL - Abnormal; Notable for the following:    Glucose, Bld 129 (*)    GFR calc non Af Amer 76 (*)    GFR calc Af Amer 88 (*)    All other components within normal limits  PRO B NATRIURETIC PEPTIDE - Abnormal; Notable for the following:    Pro B Natriuretic peptide (BNP) 211.3 (*)    All other components within normal limits  CBC - Abnormal; Notable for the following:    RBC 3.86 (*)    Hemoglobin 8.9 (*)    HCT 27.3 (*)    MCV 70.7 (*)    MCH 23.1 (*)    RDW 16.2 (*)    All other components within normal limits  CBC - Abnormal; Notable for the following:    RBC 3.74 (*)    Hemoglobin 8.7 (*)    HCT 26.5 (*)    MCV 70.9 (*)    MCH 23.3 (*)    RDW 16.1 (*)    All other components within normal limits  COMPREHENSIVE METABOLIC PANEL - Abnormal; Notable for the following:    Glucose, Bld 118 (*)    Albumin 3.2 (*)    Total Bilirubin 0.1 (*)    GFR calc non Af Amer 75 (*)    GFR calc Af Amer 87 (*)    All other components within normal limits  CBC - Abnormal; Notable for the  following:    RBC 3.79 (*)    Hemoglobin 8.7 (*)    HCT 26.7 (*)    MCV 70.4 (*)    MCH 23.0 (*)    RDW 16.0 (*)    All other components within normal limits  IRON AND TIBC - Abnormal; Notable for the following:    Saturation Ratios 13 (*)    All other components within normal limits  PROTIME-INR - Abnormal; Notable for the following:    Prothrombin Time 15.8 (*)    All other components within normal limits  CBC - Abnormal; Notable for the following:    RBC 3.78 (*)  Hemoglobin 8.8 (*)    HCT 26.6 (*)    MCV 70.4 (*)    MCH 23.3 (*)    RDW 16.2 (*)    All other components within normal limits  CARDIAC PANEL(CRET KIN+CKTOT+MB+TROPI)  PROTIME-INR  CARDIAC PANEL(CRET KIN+CKTOT+MB+TROPI)  CARDIAC PANEL(CRET KIN+CKTOT+MB+TROPI)  TYPE AND SCREEN  ABO/RH  PROTIME-INR  MAGNESIUM  PHOSPHORUS  TSH  VITAMIN B12  FOLATE  FERRITIN  RETICULOCYTES  DIGOXIN LEVEL        Dg Chest 2 View  11/01/2011  *RADIOLOGY REPORT*  Clinical Data: Shortness of breath, left chest pain, history hypertension, CHF  CHEST - 2 VIEW  Comparison: 09/27/2011  Findings: Borderline enlargement of cardiac silhouette. Mediastinal contours and pulmonary vascularity normal. Lungs clear. No pleural effusion or pneumothorax. No acute osseous findings.  IMPRESSION: No acute abnormalities.  Original Report Authenticated By: Lollie Marrow, M.D.   Ct Angio Chest W/cm &/or Wo Cm  11/01/2011  *RADIOLOGY REPORT*  Clinical Data: Chest pain and shortness of breath.  CT ANGIOGRAPHY CHEST  Technique:  Multidetector CT imaging of the chest using the standard protocol during bolus administration of intravenous contrast. Multiplanar reconstructed images including MIPs were obtained and reviewed to evaluate the vascular anatomy.  Contrast: OMNIPAQUE IOHEXOL 300 MG/ML IV SOLN  Comparison: Chest radiograph performed earlier today at 01:11 a.m., and CTA of the chest performed 07/17/2011  Findings: There is no evidence of  pulmonary embolus.  Minimal bibasilar atelectasis is noted; the lungs are otherwise clear.  There is no evidence of significant focal consolidation, pleural effusion or pneumothorax.  No masses are identified; no abnormal focal contrast enhancement is seen.  Mild scattered coronary artery calcifications are noted; the mediastinum is otherwise unremarkable in appearance.  No pericardial effusion is seen.  No mediastinal lymphadenopathy is appreciated.  The great vessels are within normal limits.  No axillary lymphadenopathy is seen.  The thyroid gland is unremarkable in appearance.  The visualized portions of the liver and spleen are unremarkable.  No acute osseous abnormalities are seen.  IMPRESSION:  1.  No evidence of pulmonary embolus. 2.  Minimal bibasilar atelectasis noted; lungs otherwise clear. 3.  Mild scattered coronary artery calcifications seen.  Original Report Authenticated By: Tonia Ghent, M.D.       Brief H and P: For complete details please refer to admission H and P, but in brief  The patient is a 46 year old female with a past history notable for hypertension and combined systolic and diastolic heart failure accompanied by morbid obesity who was recently diagnosed with a deep venous thrombosis of the lower extremities. The patient presented to the emergency department on 11/01/2011 with history of chest pain and pressure. The patient reported that she had been asymptomatic until Saturday morning. She reported that she has been on a 16 ounce fluid restriction due to her CHF but had not done a very good job of following it on both Friday and Saturday. She reported that her chest pain began Saturday (2 days ago) late morning. The pain was on the left side of her chest and radiated to her back. It was not worsened by exertion, was not accompanied by any shortness of breath or diaphoresis, and was relieved by nothing. She initially felt this pain was just gas pain and ignored it. She continued  to drink more than her prescribed amount throughout the day and, in the late evening developed central chest pressure, cough, and shortness of breath. These did not improve throughout the remainder of  the evening and early Sunday morning she presented to the emergency department.  Because of her recent history of a DVT, the diagnosis of pulmonary embolism was entertained. CTA was performed and was negative for acute pulmonary process. Troponins were also cycled and were unremarkable. EKG obtained in the emergency department was sinus rhythm, rate of 72, normal axis, and no acute ST segment changes. As the patient does have a history of CHF with fluid overload (initially diagnosed in July 2012), the patient was given diuresis with IV Lasix, and experienced resolution of her symptoms. She was admitted to tele for further evaluation    Hospital Course:  Active Hospital Problems  Diagnoses Date Noted   . DVT (deep venous thrombosis) 11/01/2011   . Hypertension 11/01/2011   . Anemia 11/01/2011     Resolved Hospital Problems  Diagnoses Date Noted Date Resolved  . Chest pain 11/01/2011 11/03/2011  . Shortness of breath 11/01/2011 11/03/2011  . Diastolic heart failure secondary to hypertension 11/01/2011 11/03/2011  . Acute combined systolic and diastolic HF (heart failure) 05/02/2011 11/02/2011    Active Problems:  Chest pain - atypical.  PE ruled out per CT, cardiac enzymes neg. Pt seen by cardiology who assessed most likely muscoskelatal or GERD. AMI ruled out. Will be set up for lexiscan as OP in near future.  Continue Aspirin.  At time of discharge pt symptom free.   Marland KitchenShortness of breath - possibly multifactorial patient i.e. Acute on chronic HF vs anemia vs deconditioning. Much improved today. Will ambulate and monitor sat. Sats 96-100% on room air at rest.  .DVT (deep venous thrombosis) - patient has a history of DVT since September. She is subtherapeutic on her Coumadin unfortunately she is  also developing worsening anemia. She has known history of iron deficiency anemia and heavy menses no past history of GI blood loss. Pt. Reports compliance. Will have INR checked tomorrow for OP management. Cards does not feel bridging required as no acute event.   .Anemia - known hx of iron deficiency anemia with heavy menses. On oral supplement. Will need OP f/u with OB for heavy menses  .Hypertension - only fair control. Appreciate cards input.  Lisinopril started. Carvedilol increased. Has follow up apt. Jan 25 at 9:45.  Marland KitchenCHF (congestive heart failure) -  Combined systolic/diastolic (last known EF 15020%). Resolved. Pt admitted and given lasix. Symptoms improved even though I&O's yield modest neg balance. Pt. Seen by cardiology.  2decho on 1/14 yieds EF 55-60%. Moderate LVH noted.  . Wt. 107kg from108.4kg yesterday. Digoxin discontinued per cards.   Cardiomyopathy: felt to be non-ischemic and related to weight and poorly controlled blood pressure. Will continue with medical management per cardiology recommendation. Pt is scheduled for lexiscan as an OP to rule out any ischemic component.   Morbid obesity: Has received dietary counciling as an OP. Have continued to stress the importance of weight loss and exercise to her future health and well being.        Time spent on Discharge: 45 min   Signed: Gwenyth Bender 11/03/2011, 10:21 AM

## 2011-11-03 NOTE — Progress Notes (Signed)
ANTICOAGULATION CONSULT NOTE  Pharmacy Consult for Warfarin Indication: Hx of DVT  Allergies  Allergen Reactions  . Detrol (Tolterodine Tartrate)     rash    Patient Measurements: Height: 5\' 2"  (157.5 cm) Weight: 236 lb 15.9 oz (107.5 kg) IBW/kg (Calculated) : 50.1   Vital Signs: Temp: 97.7 F (36.5 C) (01/15 0624) Temp src: Oral (01/15 0624) BP: 138/90 mmHg (01/15 0624) Pulse Rate: 66  (01/15 0624)  Labs:  Basename 11/03/11 0500 11/02/11 0518 11/01/11 2000 11/01/11 1435 11/01/11 0950 11/01/11 0215  HGB 8.8* 8.7* -- -- -- --  HCT 26.6* 26.7* 26.5* -- -- --  PLT 299 299 309 -- -- --  APTT -- -- -- -- -- --  LABPROT 15.8* 15.1 -- -- -- 14.4  INR 1.23 1.17 -- -- -- 1.10  HEPARINUNFRC -- -- -- -- -- --  CREATININE -- 0.92 -- -- -- 0.91  CKTOTAL -- -- -- 105 107 112  CKMB -- -- -- 1.6 1.7 1.8  TROPONINI -- -- -- <0.30 <0.30 <0.30   Estimated Creatinine Clearance: 91 ml/min (by C-G formula based on Cr of 0.92).  Medical History: Past Medical History  Diagnosis Date  . HTN (hypertension)   . Fatigue   . CHF (congestive heart failure) 04/2011    EF 15-20% from echo 05/19/11  . DOE (dyspnea on exertion)   . Orthopnea   . DVT (deep venous thrombosis) 06/2011  . Anemia   . Menorrhagia     with iron deficient anemia    Medications:  Scheduled:     . carvedilol  6.25 mg Oral BID  . enoxaparin (LOVENOX) injection  110 mg Subcutaneous Q12H  . ferrous sulfate  325 mg Oral BID WC  . hydrALAZINE  50 mg Oral TID  . hydrochlorothiazide  12.5 mg Oral Daily  . isosorbide mononitrate  60 mg Oral Daily  . lisinopril  20 mg Oral Daily  . pantoprazole  40 mg Oral Q1200  . potassium chloride  40 mEq Oral Once  . sodium chloride  3 mL Intravenous Q12H  . warfarin  15 mg Oral ONCE-1800  . DISCONTD: digoxin  250 mcg Oral Daily  . DISCONTD: furosemide  40 mg Oral Daily  . DISCONTD: hydrochlorothiazide  12.5 mg Oral Daily  Infusions:   PRN: sodium chloride, acetaminophen,  acetaminophen, albuterol, albuterol, alum & mag hydroxide-simeth, guaiFENesin-dextromethorphan, nitroGLYCERIN, ondansetron (ZOFRAN) IV, ondansetron, sodium chloride Anticoagulant doses: PTA warfarin 10mg  daily Lovenox 1/13 >> Coumadin: reportedly took home dose 10mg  and 5mg  on 1/13, 15mg  on 1/14  Assessment: 46 yo F on warfarin prior to admit for hx of DVT No evidence of PE on CT Anemia with stable/low H/H INR subtherapeutic, but slowly increasing Therapeutic Lovenox started per MD  Goal of Therapy:  INR 2-3   Plan:  1) Repeat boosted dose with warfarin 15mg  PO today at 1800 2) F/U daily INR, CBC  Lynann Beaver PharmD   Pager [phone] 11/03/2011 7:37 AM

## 2011-11-03 NOTE — Discharge Summary (Signed)
Patient seen and examined,stable for discharge to follow with Dr Jacinto Halim as outpatient ,also advised to follow with a gynecologist.

## 2011-11-03 NOTE — Progress Notes (Signed)
   CARE MANAGEMENT NOTE 11/03/2011  Patient:  [name], [name]   Account Number:  [id number]  Date Initiated:  11/02/2011  Documentation initiated by:  Lanier Clam  Subjective/Objective Assessment:   ADMITTED W/CHEST PAIN. HX:DVT,CHF     Action/Plan:   FROM HOME W/FAMILY   Anticipated DC Date:  11/04/2011   Anticipated DC Plan:  HOME/SELF CARE         Choice offered to / List presented to:             Status of service:  Completed, signed off Medicare Important Message given?   (If response is "NO", the following Medicare IM given date fields will be blank) Date Medicare IM given:   Date Additional Medicare IM given:    Discharge Disposition:  HOME/SELF CARE  Per UR Regulation:  Reviewed for med. necessity/level of care/duration of stay  Comments:  11/01/13 [facility] RN,BSN [phone]

## 2011-11-03 NOTE — Progress Notes (Signed)
Pt d/c in stable condition in w/c by NT. Pt in possession of d/c instructions and all personal belongings.

## 2011-11-03 NOTE — Progress Notes (Signed)
D/C instructions reviewed w/ pt. Pt verbalizes understanding and all questions answered. HF packet reviewed w/ pt. No further questions. Pt states she uses [facility] for her prescriptions. States MD calls in scripts and [facility] delivers them to her. Clydie Braun, NP, aware and will call in scripts for pt. Pt awaiting ride home.

## 2011-11-06 ENCOUNTER — Encounter (HOSPITAL_COMMUNITY): Payer: Self-pay | Admitting: Respiratory Therapy

## 2011-11-16 NOTE — Consult Note (Addendum)
Cardiology Consult Note Reason for Consult: Chest pain Referring Physician: Toya Smothers, NP  [name] is an 46 y.o. female.   HPI: The patient is a 46 year old female with a past history notable for hypertension and combined systolic and diastolic heart failure accompanied by morbid obesity who was recently diagnosed with a deep venous thrombosis of the lower extremities. The patient presented to the emergency department on 11/01/2011 with history of chest pain and pressure. The patient reports that she had been asymptomatic until Saturday morning. She reports that she has been on a 16 ounce fluid restriction due to her CHF but had not done a very good job of following it on both Friday and Saturday. She reports that her chest pain began Saturday (2 days ago) late morning. The pain was on the left side of her chest and radiated to her back. It was not worsened by exertion, was not accompanied by any shortness of breath or diaphoresis, and was relieved by nothing. She initially felt this pain was just gas pain and ignored it. She continued to drink more than her prescribed amount throughout the day and, in the late evening developed central chest pressure, cough, and shortness of breath. These did not improve throughout the remainder of the evening and early Sunday morning she presented to the emergency department.  Because of her recent history of a DVT, the diagnosis of pulmonary embolism was entertained. CTA was performed and was negative for acute pulmonary process. Troponins were also cycled and were unremarkable.  EKG obtained in the emergency department was sinus rhythm, rate of 72, normal axis, and no acute ST segment changes. As the patient does have a history of CHF with fluid overload (initially diagnosed in July 2012), the patient was given diuresis with IV Lasix, and experienced resolution of her symptoms, although review of her chart does not demonstrate significant diuresis.  (Pt charted  as being net negative 420cc since admission.)   The patient remains asymptomatic at this point in time. She denies any active complaints, any chest pain, pressure, shortness of breath, visual changes, headaches, cough, lower extremity swelling, or any GI upset She was discharged home after 2 day stay at [facility]. Had out patient stress test which was abnormal and hence set up for cardiac cath.  Past Medical History  Diagnosis Date  . HTN (hypertension)   . Fatigue   . CHF (congestive heart failure) 04/2011    EF 15-20% from echo 05/19/11  . DOE (dyspnea on exertion)   . Orthopnea   . DVT (deep venous thrombosis) 06/2011  . Anemia   . Menorrhagia     with iron deficient anemia    Past Surgical History  Procedure Date  . Tubal ligation 2006  . Cyst removed from ovary     Family History  Problem Relation Age of Onset  . Hypertension Mother   . Stroke Father     Social History:  reports that she quit smoking about 18 years ago. Her smoking use included Cigarettes. She has a 2.5 pack-year smoking history. She does not have any smokeless tobacco history on file. She reports that she does not drink alcohol or use illicit drugs.  Allergies:  Allergies  Allergen Reactions  . Detrol (Tolterodine Tartrate)     rash    Medications: I have reviewed the patient's current medications.  No results found for this or any previous visit (from the past 48 hour(s)).  No results found.  ROS Per  HPI.  No visual changes, headache, cough, chest pain, GI upset, bleeding, bruising, LE swelling at this time. Last menstrual period 10/24/2011. Physical Exam General: Morbidly obese female, interactive and appropriate HEENT: MMM, PERRL, EOMI, no JVD although exam is somewhat limited by body habitus, trachea midline CV: RRR, II/VI systolic ejection murmur heard best at LUSB, no gallop Resp: CTABL, no crackles heard, breath sounds slightly diminished throughout secondary to body habitus, no  focal findings Abd: SNTND, +BS all 4 quadrents Ext: No LE edema, 2+ DP, popliteal and radial pulses  Assessment/Plan: 1) CHF, combined systolic/diastolic (last known EF 15-20%): Resolved. EF now normal by echo done 11/02/11. Moderate LVH noted.    2) Atypical chest pain without enzyme leak or significant EKG changes. Most likely musculoskeletal or GERD.  Pt has ruled out for AMI and is currently symptom free. Do not feel that this patient requires further cardiac workup this admission.  If she remains symptom free overnight and no new/concerning findings on echo would be OK to d/c in the AM.  Patient will be set up for lexiscan as an OP in the near future  3) Cardiomyopathy: felt to be non-ischemic and related to weight and poorly controlled blood pressure.  Echo pending.  If no significant change from previous, will continue with medical management.  Pt is scheduled for lexiscan as an OP to rule out any ischemic component.  3) HTN: Relatively poorly controlled, goal bp 130/80.  On Imdur 60mg  daily and hydralazine 50mg  TID along with carvedilol 6.25mg  BID.  Patient was on 25mg  of carvedilol BID at her last office visit with Dr. Jacinto Halim.  It would be OK to try increasing her current dose to at least 12.5mg  BID as it would appear that her blood pressure and heart rate would tolerate it.  Pt has also been on ACEI in the past (which was stopped in her hospitalization 9/12 due to AKI secondary to dehydration).  This would certainly be a possibility to add as an additional agent if heart rate does not tolerate increased BB.  5) Morbid obesity: Has received dietary counciling as an OP.  Have continued to stress the importance of weight loss and exercise to her future health and well being.  6) Anemia, iron deficient, related to menorrhagia:  On oral iron replacement.  Agree with seeing a gynecologist as an OP.  Pt may be a good candidate for an IUD for control of bleeding if there is no evidence of atypical  endometrial cells.  7) Recent DVT during hospital admit on coumadin  (dx Sept 2012):  Claims compliance with coumadin.  INR significantly low on admission.  Will need close OP follow up  monitor INR.  Do not feel that she requires bridging as there is no acute event here.  Pt will need to be on coumadin at least until March of this year (6 months anticoagulation for first incidence of DVT).  8) Abnormal stress nuclear study and CT angio chest at [facility]: Has coronary calcification by CT angio chest.  Rec: Patient set up for cardiac cath. All questions answered. She will hold coumadin for 3 days and INR check on the day of cardiac cath.  I also reviewed with the patient regarding abnormal stress test.  Lexiscan stress: 11/06/11: Ischemic EKG changes. Inferior, inf-septal & inf-apical severe isch. EF 85%. Given recurrent admission to the hospital and an abnormal stress will schedule for left heart cath via radial artery access. Patient understands < 1% risk of death,  stroke, MI, urgent CABG and 2-3% risk of bleeding, but not limited to these. Will hold coumadin 3 days prior. No overlap needed. DVT was related to prior hospitalization and pneumonia > 3 months ago.   Bexley Mclester,JAGADEESH R,  11/16/2011, 8:44 PM    No interim change to HPI.

## 2011-11-17 ENCOUNTER — Other Ambulatory Visit: Payer: Self-pay

## 2011-11-17 ENCOUNTER — Encounter (HOSPITAL_COMMUNITY)
Admission: RE | Disposition: A | Discharge status: Home / Self Care | Payer: Self-pay | Source: Ambulatory Visit | Attending: Cardiology

## 2011-11-17 ENCOUNTER — Ambulatory Visit (HOSPITAL_COMMUNITY)
Admission: RE | Admit: 2011-11-17 | Discharge: 2011-11-18 | Disposition: A | Discharge status: Home / Self Care | Payer: Medicaid Other | Source: Ambulatory Visit | Attending: Cardiology | Admitting: Cardiology

## 2011-11-17 DIAGNOSIS — D649 Anemia, unspecified: Secondary | ICD-10-CM | POA: Insufficient documentation

## 2011-11-17 DIAGNOSIS — R7309 Other abnormal glucose: Secondary | ICD-10-CM | POA: Insufficient documentation

## 2011-11-17 DIAGNOSIS — R072 Precordial pain: Secondary | ICD-10-CM | POA: Diagnosis present

## 2011-11-17 DIAGNOSIS — I25118 Atherosclerotic heart disease of native coronary artery with other forms of angina pectoris: Secondary | ICD-10-CM | POA: Diagnosis not present

## 2011-11-17 DIAGNOSIS — E785 Hyperlipidemia, unspecified: Secondary | ICD-10-CM

## 2011-11-17 DIAGNOSIS — R079 Chest pain, unspecified: Secondary | ICD-10-CM | POA: Diagnosis present

## 2011-11-17 DIAGNOSIS — I10 Essential (primary) hypertension: Secondary | ICD-10-CM | POA: Insufficient documentation

## 2011-11-17 DIAGNOSIS — R9439 Abnormal result of other cardiovascular function study: Secondary | ICD-10-CM | POA: Diagnosis present

## 2011-11-17 DIAGNOSIS — I2510 Atherosclerotic heart disease of native coronary artery without angina pectoris: Secondary | ICD-10-CM | POA: Diagnosis not present

## 2011-11-17 HISTORY — PX: LEFT HEART CATHETERIZATION WITH CORONARY ANGIOGRAM: SHX5451

## 2011-11-17 LAB — HCG, SERUM, QUALITATIVE: Preg, Serum: NEGATIVE

## 2011-11-17 LAB — PROTIME-INR: Prothrombin Time: 17.6 seconds — ABNORMAL HIGH (ref 11.6–15.2)

## 2011-11-17 SURGERY — LEFT HEART CATHETERIZATION WITH CORONARY ANGIOGRAM
Anesthesia: LOCAL

## 2011-11-17 MED ORDER — MIDAZOLAM HCL 2 MG/2ML IJ SOLN
INTRAMUSCULAR | Status: AC
  Filled 2011-11-17: qty 2

## 2011-11-17 MED ORDER — ROSUVASTATIN CALCIUM 10 MG PO TABS
10.0000 mg | ORAL_TABLET | Freq: Every day | ORAL | Status: DC
  Administered 2011-11-17: 10 mg via ORAL
  Filled 2011-11-17 (×2): qty 1

## 2011-11-17 MED ORDER — SODIUM CHLORIDE 0.9 % IJ SOLN: 3.0000 mL | Freq: Two times a day (BID) | INTRAMUSCULAR | Status: DC

## 2011-11-17 MED ORDER — ASPIRIN EC 81 MG PO TBEC
81.0000 mg | DELAYED_RELEASE_TABLET | Freq: Every day | ORAL | Status: DC
  Administered 2011-11-18: 81 mg via ORAL
  Filled 2011-11-17: qty 1

## 2011-11-17 MED ORDER — CARVEDILOL 6.25 MG PO TABS
6.2500 mg | ORAL_TABLET | Freq: Two times a day (BID) | ORAL | Status: DC
  Administered 2011-11-17 – 2011-11-18 (×2): 6.25 mg via ORAL
  Filled 2011-11-17 (×3): qty 1

## 2011-11-17 MED ORDER — LIDOCAINE HCL (PF) 1 % IJ SOLN
INTRAMUSCULAR | Status: AC
  Filled 2011-11-17: qty 30

## 2011-11-17 MED ORDER — PRASUGREL HCL 10 MG PO TABS
10.0000 mg | ORAL_TABLET | Freq: Every day | ORAL | Status: DC
  Administered 2011-11-17 – 2011-11-18 (×2): 10 mg via ORAL
  Filled 2011-11-17 (×2): qty 1

## 2011-11-17 MED ORDER — BIVALIRUDIN 250 MG IV SOLR
INTRAVENOUS | Status: AC
  Filled 2011-11-17: qty 250

## 2011-11-17 MED ORDER — LISINOPRIL 20 MG PO TABS
20.0000 mg | ORAL_TABLET | Freq: Every day | ORAL | Status: DC
  Administered 2011-11-18: 20 mg via ORAL
  Filled 2011-11-17: qty 1

## 2011-11-17 MED ORDER — HYDROMORPHONE HCL PF 2 MG/ML IJ SOLN
INTRAMUSCULAR | Status: AC
  Filled 2011-11-17: qty 1

## 2011-11-17 MED ORDER — WARFARIN SODIUM 7.5 MG PO TABS
15.0000 mg | ORAL_TABLET | Freq: Every day | ORAL | Status: DC
  Administered 2011-11-17: 15 mg via ORAL
  Filled 2011-11-17 (×3): qty 2

## 2011-11-17 MED ORDER — VERAPAMIL HCL 2.5 MG/ML IV SOLN
INTRAVENOUS | Status: AC
  Filled 2011-11-17: qty 2

## 2011-11-17 MED ORDER — ASPIRIN 81 MG PO CHEW
324.0000 mg | CHEWABLE_TABLET | ORAL | Status: AC
  Administered 2011-11-17: 324 mg via ORAL
  Filled 2011-11-17: qty 4

## 2011-11-17 MED ORDER — ISOSORBIDE MONONITRATE ER 60 MG PO TB24
60.0000 mg | ORAL_TABLET | Freq: Every day | ORAL | Status: DC
  Administered 2011-11-18: 60 mg via ORAL
  Filled 2011-11-17: qty 1

## 2011-11-17 MED ORDER — ASPIRIN 81 MG PO TABS: 81.0000 mg | ORAL_TABLET | Freq: Every day | ORAL | Status: DC

## 2011-11-17 MED ORDER — SODIUM CHLORIDE 0.9 % IV SOLN
INTRAVENOUS | Status: AC
  Administered 2011-11-17: 14:00:00 via INTRAVENOUS

## 2011-11-17 MED ORDER — SODIUM CHLORIDE 0.9 % IV SOLN: INTRAVENOUS | Status: DC

## 2011-11-17 MED ORDER — FERROUS SULFATE 325 (65 FE) MG PO TABS
325.0000 mg | ORAL_TABLET | Freq: Two times a day (BID) | ORAL | Status: DC
  Administered 2011-11-17 – 2011-11-18 (×2): 325 mg via ORAL
  Filled 2011-11-17 (×4): qty 1

## 2011-11-17 MED ORDER — IRON 325 (65 FE) MG PO TABS: 1.0000 | ORAL_TABLET | Freq: Two times a day (BID) | ORAL | Status: DC

## 2011-11-17 MED ORDER — ONDANSETRON HCL 4 MG/2ML IJ SOLN: 4.0000 mg | Freq: Four times a day (QID) | INTRAMUSCULAR | Status: DC | PRN

## 2011-11-17 MED ORDER — ALBUTEROL SULFATE HFA 108 (90 BASE) MCG/ACT IN AERS
2.0000 | INHALATION_SPRAY | Freq: Four times a day (QID) | RESPIRATORY_TRACT | Status: DC | PRN
  Filled 2011-11-17: qty 6.7

## 2011-11-17 MED ORDER — HYDRALAZINE HCL 50 MG PO TABS
50.0000 mg | ORAL_TABLET | Freq: Three times a day (TID) | ORAL | Status: DC
  Administered 2011-11-17 – 2011-11-18 (×3): 50 mg via ORAL
  Filled 2011-11-17 (×6): qty 1

## 2011-11-17 MED ORDER — NITROGLYCERIN 0.2 MG/ML ON CALL CATH LAB
INTRAVENOUS | Status: AC
  Filled 2011-11-17: qty 1

## 2011-11-17 MED ORDER — PANTOPRAZOLE SODIUM 40 MG PO TBEC: 40.0000 mg | DELAYED_RELEASE_TABLET | Freq: Every day | ORAL | Status: DC

## 2011-11-17 MED ORDER — SODIUM CHLORIDE 0.9 % IJ SOLN: 3.0000 mL | INTRAMUSCULAR | Status: DC | PRN

## 2011-11-17 MED ORDER — SODIUM CHLORIDE 0.9 % IV SOLN: 250.0000 mL | INTRAVENOUS | Status: DC | PRN

## 2011-11-17 MED ORDER — HEPARIN (PORCINE) IN NACL 2-0.9 UNIT/ML-% IJ SOLN
INTRAMUSCULAR | Status: AC
  Filled 2011-11-17: qty 2000

## 2011-11-17 MED ORDER — ACETAMINOPHEN 325 MG PO TABS: 650.0000 mg | ORAL_TABLET | ORAL | Status: DC | PRN

## 2011-11-17 MED ORDER — ADENOSINE 12 MG/4ML IV SOLN
16.0000 mL | Freq: Once | INTRAVENOUS | Status: DC
  Filled 2011-11-17: qty 16

## 2011-11-17 MED ORDER — HYDROCHLOROTHIAZIDE 12.5 MG PO CAPS
12.5000 mg | ORAL_CAPSULE | Freq: Every day | ORAL | Status: DC
  Administered 2011-11-18: 12.5 mg via ORAL
  Filled 2011-11-17: qty 1

## 2011-11-17 NOTE — Progress Notes (Signed)
TR BAND REMOVAL  LOCATION:  right radial  DEFLATED PER PROTOCOL:  yes  TIME BAND OFF / DRESSING APPLIED:   1845   SITE UPON ARRIVAL:   Level 0  SITE AFTER BAND REMOVAL:  Level 0  REVERSE ALLEN'S TEST:    positive  CIRCULATION SENSATION AND MOVEMENT:  Within Normal Limits  yes  COMMENTS:    

## 2011-11-17 NOTE — Brief Op Note (Signed)
11/17/2011  2:06 PM  PATIENT: [name]  PRE-OPERATIVE DIAGNOSIS:  Chest pain   POST-OPERATIVE DIAGNOSIS: CAD. S/P PTCA to Mid RCA  PROCEDURE (S):  leftHEART CATHETERIZATION WITH CORONARY/LV ANGIOGRAM, PTCA and stent Mid RCA FFR guided.  Cardiologist: Jeanella Cara, MD, [facility].:    Referring MD: Other: Helth Serve      DICTATION: .Other Dictation: Dictation Number [id number]   PATIENT DISPOSITION:  admit

## 2011-11-18 ENCOUNTER — Other Ambulatory Visit: Payer: Self-pay

## 2011-11-18 DIAGNOSIS — I25118 Atherosclerotic heart disease of native coronary artery with other forms of angina pectoris: Secondary | ICD-10-CM | POA: Diagnosis not present

## 2011-11-18 DIAGNOSIS — E785 Hyperlipidemia, unspecified: Secondary | ICD-10-CM

## 2011-11-18 DIAGNOSIS — I2510 Atherosclerotic heart disease of native coronary artery without angina pectoris: Secondary | ICD-10-CM | POA: Diagnosis not present

## 2011-11-18 LAB — BASIC METABOLIC PANEL
CO2: 25 mEq/L (ref 19–32)
Chloride: 103 mEq/L (ref 96–112)
GFR calc Af Amer: 86 mL/min — ABNORMAL LOW (ref >90)
Potassium: 4.2 mEq/L (ref 3.5–5.1)
Sodium: 137 mEq/L (ref 135–145)

## 2011-11-18 LAB — CBC
Platelets: 280 10*3/uL (ref 150–400)
RBC: 3.78 MIL/uL — ABNORMAL LOW (ref 3.87–5.11)
RDW: 15.6 % — ABNORMAL HIGH (ref 11.5–15.5)
WBC: 5.2 10*3/uL (ref 4.0–10.5)

## 2011-11-18 LAB — PROTIME-INR
INR: 1.3 (ref 0.00–1.49)
Prothrombin Time: 16.4 seconds — ABNORMAL HIGH (ref 11.6–15.2)

## 2011-11-18 MED ORDER — PRASUGREL HCL 10 MG PO TABS: 10.0000 mg | ORAL_TABLET | Freq: Every day | ORAL | Status: DC

## 2011-11-18 NOTE — Discharge Summary (Addendum)
Physician Discharge Summary  Patient ID: [name] MRN: [id number] DOB/AGE: 05/07/1966 46 y.o.  Admit date: 11/17/2011 Discharge date: 11/18/2011  Primary Discharge Diagnosis: CAD S/P PTCA and stent of Mid RCA with 2.75x12 mm Integrity stent.  Secondary Discharge Diagnosis Hypertension. Morbid obesity, H/O DVT due to hospitalization and immobility. Coumadin stopped.  Hyperlipidemia  Significant Diagnostic Studies:  Consults:   Hospital Course: patient has had multiple admissions to the hospital with recurrent chest pain. She had severe the systolic dysfunction with ejection fraction of 15-20% about 6 months ago. On last hospital admission 3 weeks ago a repeat echocardiogram revealed an ejection fraction and improved back to normal. But because of recurrent chest discomfort she underwent outpatient stress testing and this revealed severe in wall ischemia. She was brought to the clinic On elective basis for coronary in [place]. Patient did well and remained stable post procedure. She was deemed fit for discharge. She also has chronic anemia hence her Coumadin which was started for DVT about 5 months ago postop. Stopped due to anemia. Has h/o Menorrhagia  Recommendation: She will need staged PCI to Circumflex coronary artery via femoral route due to complexity. Cardiac rehab and weight loss discussed.panel done on 05/20/2011 revealed total cholesterol of 168 HDL 42 LDL 1181, Triglyceride 71   Discharge Exam: Blood pressure 140/66, pulse 73, temperature 98 F (36.7 C), temperature source Oral, resp. rate 20, height 5\' 2"  (1.575 m), weight 107.4 kg (236 lb 12.4 oz), last menstrual period 10/24/2011, SpO2 100.00%.  General appearance: alert, cooperative and appears stated age Neck: no adenopathy, no carotid bruit, no JVD, supple, symmetrical, trachea midline and thyroid not enlarged, symmetric, no tenderness/mass/nodules Resp: clear to auscultation bilaterally Cardio: regular rate and  rhythm, S1, S2 normal, no murmur, click, rub or gallop GI: soft, non-tender; bowel sounds normal; no masses,  no organomegaly Extremities: extremities normal, atraumatic, no cyanosis or edema Incision/Wound: Right radial access site stable. Labs:   Lab Results  Component Value Date   WBC 5.2 11/18/2011   HGB 8.6* 11/18/2011   HCT 25.9* 11/18/2011   MCV 68.5* 11/18/2011   PLT 280 11/18/2011    Lab 11/18/11 0400  NA 137  K 4.2  CL 103  CO2 25  BUN 18  CREATININE 0.93  CALCIUM 8.8  PROT --  BILITOT --  ALKPHOS --  ALT --  AST --  GLUCOSE 122*   Lab Results  Component Value Date   CKTOTAL 105 11/01/2011   CKMB 1.6 11/01/2011   TROPONINI <0.30 11/01/2011    Lab Results  Component Value Date   CHOL 168 05/20/2011   CHOL 165 05/19/2011   CHOL  Value: 136        ATP III CLASSIFICATION:  <200     mg/dL   Desirable  409-811  mg/dL   Borderline High  >=914    mg/dL   High        78/29/5621   Lab Results  Component Value Date   HDL 42 05/20/2011   HDL 43 05/19/2011   HDL 39* 08/09/2009   Lab Results  Component Value Date   LDLCALC 112* 05/20/2011   LDLCALC 109* 05/19/2011   LDLCALC  Value: 91        Total Cholesterol/HDL:CHD Risk Coronary Heart Disease Risk Table                     Men   Women  1/2 Average Risk   3.4   3.3  Average Risk       5.0   4.4  2 X Average Risk   9.6   7.1  3 X Average Risk  23.4   11.0        Use the calculated Patient Ratio above and the CHD Risk Table to determine the patient's CHD Risk.        ATP III CLASSIFICATION (LDL):  <100     mg/dL   Optimal  829-562  mg/dL   Near or Above                    Optimal  130-159  mg/dL   Borderline  130-865  mg/dL   High  >784     mg/dL   Very High 69/62/9528   Lab Results  Component Value Date   TRIG 71 05/20/2011   TRIG 65 05/19/2011   TRIG 30 08/09/2009   Lab Results  Component Value Date   CHOLHDL 4.0 05/20/2011   CHOLHDL 3.8 05/19/2011   CHOLHDL 3.5 08/09/2009   No results found for this basename: LDLDIRECT        Radiology: Dg Chest 2 View  11/01/2011  *RADIOLOGY REPORT*  Clinical Data: Shortness of breath, left chest pain, history hypertension, CHF  CHEST - 2 VIEW  Comparison: 09/27/2011  Findings: Borderline enlargement of cardiac silhouette. Mediastinal contours and pulmonary vascularity normal. Lungs clear. No pleural effusion or pneumothorax. No acute osseous findings.  IMPRESSION: No acute abnormalities.  Original Report Authenticated By: Lollie Marrow, M.D.   Ct Angio Chest W/cm &/or Wo Cm  11/01/2011  *RADIOLOGY REPORT*  Clinical Data: Chest pain and shortness of breath.  CT ANGIOGRAPHY CHEST  Technique:  Multidetector CT imaging of the chest using the standard protocol during bolus administration of intravenous contrast. Multiplanar reconstructed images including MIPs were obtained and reviewed to evaluate the vascular anatomy.  Contrast: OMNIPAQUE IOHEXOL 300 MG/ML IV SOLN  Comparison: Chest radiograph performed earlier today at 01:11 a.m., and CTA of the chest performed 07/17/2011  Findings: There is no evidence of pulmonary embolus.  Minimal bibasilar atelectasis is noted; the lungs are otherwise clear.  There is no evidence of significant focal consolidation, pleural effusion or pneumothorax.  No masses are identified; no abnormal focal contrast enhancement is seen.  Mild scattered coronary artery calcifications are noted; the mediastinum is otherwise unremarkable in appearance.  No pericardial effusion is seen.  No mediastinal lymphadenopathy is appreciated.  The great vessels are within normal limits.  No axillary lymphadenopathy is seen.  The thyroid gland is unremarkable in appearance.  The visualized portions of the liver and spleen are unremarkable.  No acute osseous abnormalities are seen.  IMPRESSION:  1.  No evidence of pulmonary embolus. 2.  Minimal bibasilar atelectasis noted; lungs otherwise clear. 3.  Mild scattered coronary artery calcifications seen.  Original Report Authenticated By:  Tonia Ghent, M.D.    EKG:NSR  FOLLOW UP PLANS AND APPOINTMENTS  Medication List  As of 11/18/2011  8:51 AM   STOP taking these medications         warfarin 7.5 MG tablet         TAKE these medications         aspirin 81 MG tablet   Take 81 mg by mouth daily.      carvedilol 6.25 MG tablet   Commonly known as: COREG   Take 6.25 mg by mouth 2 (two) times daily.      hydrALAZINE 50 MG  tablet   Commonly known as: APRESOLINE   Take 50 mg by mouth 3 (three) times daily.      hydrochlorothiazide 12.5 MG capsule   Commonly known as: MICROZIDE   Take 12.5 mg by mouth daily.      Iron 325 (65 FE) MG Tabs   Take 1 tablet by mouth 2 (two) times daily.      isosorbide mononitrate 60 MG 24 hr tablet   Commonly known as: IMDUR   Take 60 mg by mouth daily.      lisinopril 20 MG tablet   Commonly known as: PRINIVIL,ZESTRIL   Take 20 mg by mouth daily.      pantoprazole 40 MG tablet   Commonly known as: PROTONIX   Take 40 mg by mouth daily.      prasugrel 10 MG Tabs   Commonly known as: EFFIENT   Take 1 tablet (10 mg total) by mouth daily.      VENTOLIN HFA 108 (90 BASE) MCG/ACT inhaler   Generic drug: albuterol   Inhale 2 puffs into the lungs every 6 (six) hours as needed. For shortness of breath.           Follow-up Information    Follow up with Pamella Pert, MD. (Patient has appointment)    Contact information:   [address]  [place] [address] [phone]           Pamella Pert, MD 11/18/2011, 8:51 AM

## 2011-11-18 NOTE — Progress Notes (Signed)
   CARE MANAGEMENT NOTE 11/18/2011  Patient:  [name], [name]   Account Number:  [id number]  Date Initiated:  11/18/2011  Documentation initiated by:  GRAVES-BIGELOW,Mazen Marcin  Subjective/Objective Assessment:   Pt admitted with cp. Plan for home on effient. CM will provide pt with effient 30 day free card. MD has written rx for 30 day free card. Md will need to call for prior authorization for medication [phone] for reaseon for medication.     Action/Plan:   RN Vernona Rieger will call MD to give him # and medicaid #. No other needs assessed by CM at this time.   Anticipated DC Date:  11/18/2011   Anticipated DC Plan:  HOME/SELF CARE      DC Planning Services  CM consult      Choice offered to / List presented to:             Status of service:  Completed, signed off Medicare Important Message given?   (If response is "NO", the following Medicare IM given date fields will be blank) Date Medicare IM given:   Date Additional Medicare IM given:    Discharge Disposition:  HOME/SELF CARE  Per UR Regulation:    Comments:

## 2011-11-18 NOTE — Progress Notes (Signed)
CARDIAC REHAB PHASE I   PRE:  Rate/Rhythm: 72SR  BP:  Supine:   Sitting: 118/64  Standing:    SaO2: 100%RA  MODE:  Ambulation: 680 ft   POST:  Rate/Rhythem: 100SR  BP:  Supine:   Sitting: 133/80  Standing:    SaO2: 100%RA 4098-1191 Pt walked 680 ft on RA with slow,steady gait. Denied chest pain. Tolerated well. Education completed. Pt voiced understanding of CHF packet she received previously. Permission given to refer to [facility] Phase 2.  Duanne Limerick

## 2011-11-18 NOTE — Cardiovascular Report (Signed)
[name], [name]              ACCOUNT NO.:  [id number]  MEDICAL RECORD NO.:  [id number]  LOCATION:  2506                         FACILITY:  [facility]  PHYSICIAN:  Pamella Pert, MD DATE OF BIRTH:  [birth year]  DATE OF PROCEDURE: DATE OF DISCHARGE:                           CARDIAC CATHETERIZATION   PROCEDURE PERFORMED:  Left heart catheterization including: 1. Left ventriculography. 2. Selective right and left coronary arteriography. 3. Fractional flow, reserved calculation of intermediate stenosis in     the right coronary artery.  INDICATION:  Ms. [name] is a 46 year old female with morbid obesity, hypertension, hyperglycemia without the diagnosis of diabetes, who has had multiple admissions to the hospital with chest pain.  She recently underwent outpatient Cardiolite stress test, and this had revealed severe inferior wall ischemia.  Given this, she was brought to the cardiac catheterization lab to evaluate her coronary anatomy.  HEMODYNAMIC DATA:  The left ventricular pressure was 100/2 with end diastole of 4 mmHg.  Aortic pressure was 94/57 with a mean of 72 mmHg. There is no pressure gradient across the aortic valve.  ANGIOGRAPHIC DATA:  Left ventricle:  Left ventricular systolic function was normal with ejection fraction of 55%.  There was no significant mitral regurgitation.  Right coronary artery:  Right coronary artery was a moderate-to-large caliber vessel.  It was a dominant vessel.  The proximal and mid segment of the right coronary artery had mild luminal irregularity.  The mid segment after the origin of RV branch has a napkin ring like stenosis which appeared to be 60%-70%.  The right coronary artery gives origin to large PL branch and small PD branch.  Left main coronary artery:  Left main coronary artery is large-caliber vessel.  It is smooth and normal.  Circumflex coronary artery:  The circumflex coronary artery is a moderate-to-large  caliber vessel.  The ostium of the circumflex has a 70% stenosis.  In one view in the cranial view, it appeared to be close to 80-90%.  The circumflex gives origin to large obtuse marginal 1, which has an ostial 80% stenosis and the stenosis continues into the secondary marginal branches of this large obtuse marginal 1 and constitute about a 40-50% stenosis.  The circumflex itself in the mid segment has a 70% followed by a 90% stenosis in a tandem fashion at the origin of a small OM 2 and OM 3.  LAD:  LAD is a large-caliber vessel.  It gives origin to a small diagonal 1 and a moderate-sized diagonal 2 from the mid segment.  The diagonal 2 has an ostial 99% stenosis.  However, this is a small vessel, measuring about 1.5 mm in lumen area.  INTERVENTION DATA:  I performed FFR on the right coronary artery because of intermediate stenosis.  At the [facility] as soon as I passed the flow wire into the right coronary artery, the FFR fell down to 0.71 without even administration of adenosine.  Hence, the lesion was felt to be significant and proceeded with intervention.  Successful PTCA and direct stenting of the mid right coronary artery with implantation of a 2.75 x 12 mm resolute drug-eluting stent which was post dilated with a 2.75 x 10  mm noncompliant balloon at 16 atmospheric pressure.  Overall stenosis reduced from 70% to 0% with brisk TIMI-3 to TIMI-3 flow maintained at the end of the procedure.  RECOMMENDATION:  The circumflex coronary artery stenosis is extremely complex.  I had extreme difficulty in engaging the left main coronary artery via the radial access and multiple catheters were utilized. Hence, I did not feel, given the complexity of the stenosis in the circumflex, this needs to be staged.  Hence, I will bring her back in a staged fashion for angioplasty of the same via femoral arterial access.  The patient will need aggressive risk modification including weight loss.  I will  also start her on a low dose of a statin, although her lipid levels have been stable and normal previously.  TECHNIQUE OF PROCEDURE:  Under sterile precautions using a 6-French right radial access, a 5-French TIG #4 catheter was advanced into the ascending aorta and the left ventriculography was performed in the RAO projection.  The catheter was utilized to engage the right coronary artery and angiography was performed.  The catheter then pulled out of the body because of inability to engage the left main coronary artery. I utilized AL1, Judkins left 3.5, a 5-French left guide.  In spite of this, I was unable to engage left main coronary artery.  I finally utilized AL1, 5-French catheter with a guide catheter, which with great difficulty, I was able to engage left main coronary artery and angiography was performed.  Catheter was then pulled out of the body over exchange length J-wire.  TECHNIQUE OF INTERVENTION:  Using Angiomax for anticoagulation, I utilized a 6-French JR-4 guide with side hole as the stenosis was in the mid segment of the right coronary artery.  Did not feel this would interfere with FFR evaluation.  I utilized the flow wire to cross into the right coronary artery.  Prior to this, I performed equalization of pressure in the proximal segment of the right coronary artery.  Having performed this, I advanced the wire at the distal to the stenosis segment.  Immediately, the FFR fell down to 0.71.  Hence, we felt the stenosis was significant.  Prior to this, intracoronary nitroglycerin was administered prior to planned angioplasty.  Then, we decided to proceed with direct stenting with 2.75 x 12 mm resolute stent which was deployed at 12 atmospheric pressure for 63 seconds followed by postdilatation with a 2.75 x 9 mm Blythe sprinter at 16 atmospheric pressure x2 for 35-40 seconds each.  Intracoronary nitroglycerin was re-administered and angiography.  Guidewire  withdrawn. Angiography repeated, guide catheter disengaged and pulled out of body. The patient tolerated the procedure.  No immediate complications.     Pamella Pert, MD     JRG/MEDQ  D:  11/17/2011  T:  11/18/2011  Job:  [id number]

## 2011-11-19 MED FILL — Dextrose Inj 5%: INTRAVENOUS | Qty: 50 | Status: AC

## 2011-11-20 ENCOUNTER — Encounter (HOSPITAL_COMMUNITY): Payer: Self-pay | Admitting: Respiratory Therapy

## 2011-11-30 ENCOUNTER — Ambulatory Visit (HOSPITAL_COMMUNITY): Admission: RE | Admit: 2011-11-30 | Payer: Self-pay | Source: Ambulatory Visit | Admitting: Cardiology

## 2011-11-30 ENCOUNTER — Encounter (HOSPITAL_COMMUNITY): Admission: RE | Payer: Self-pay | Source: Ambulatory Visit

## 2011-11-30 SURGERY — PERCUTANEOUS CORONARY STENT INTERVENTION (PCI-S)
Anesthesia: LOCAL

## 2012-01-07 ENCOUNTER — Emergency Department (INDEPENDENT_AMBULATORY_CARE_PROVIDER_SITE_OTHER)
Admission: EM | Admit: 2012-01-07 | Discharge: 2012-01-07 | Disposition: A | Discharge status: Home / Self Care | Payer: Medicaid Other | Source: Home / Self Care

## 2012-01-07 ENCOUNTER — Encounter (HOSPITAL_COMMUNITY): Payer: Self-pay | Admitting: Emergency Medicine

## 2012-01-07 DIAGNOSIS — R05 Cough: Secondary | ICD-10-CM

## 2012-01-07 MED ORDER — BENZONATATE 100 MG PO CAPS: ORAL_CAPSULE | ORAL | Status: AC

## 2012-01-07 NOTE — Discharge Instructions (Signed)
Run a vaporizer or humidifer in your bedroom while sleeping. Use cough drops. If your cough persists follow up with Dr Andrey Campanile, or your cardiologist. Your Lisinopril could also cause a dry cough.   Cough, Adult  A cough is a reflex. It helps you clear your throat and airways. A cough can help heal your body. A cough can last 2 or 3 weeks (acute) or may last more than 8 weeks (chronic). Some common causes of a cough can include an infection, allergy, or a cold. HOME CARE  Only take medicine as told by your doctor.   If given, take your medicines (antibiotics) as told. Finish them even if you start to feel better.   Use a cold steam vaporizer or humidier in your home. This can help loosen thick spit (secretions).   Sleep so you are almost sitting up (semi-upright). Use pillows to do this. This helps reduce coughing.   Rest as needed.   Stop smoking if you smoke.  GET HELP RIGHT AWAY IF:  You have yellowish-white fluid (pus) in your thick spit.   Your cough gets worse.   Your medicine does not reduce coughing, and you are losing sleep.   You cough up blood.   You have trouble breathing.   Your pain gets worse and medicine does not help.   You have a fever.  MAKE SURE YOU:   Understand these instructions.   Will watch your condition.   Will get help right away if you are not doing well or get worse.  Document Released: 06/18/2011 Document Revised: 09/24/2011 Document Reviewed: 06/18/2011 [facility] Patient Information 2012 [place], [place].

## 2012-01-07 NOTE — ED Provider Notes (Signed)
History     CSN: [id number]  Arrival date & time 01/07/12  9147   None     Chief Complaint  Patient presents with  . Cough    (Consider location/radiation/quality/duration/timing/severity/associated sxs/prior treatment) HPI Comments: Patient presents today with a nonproductive cough for the last 3-4 days. She denies fever, nasal congestion, postnasal drainage, dyspnea or wheezing. She also denies chest discomfort or peripheral edema. She is taking all of her medications as prescribed. She has tried an over-the-counter cough medication and cough drops without improvement. No nocturnal worsening of her cough.   Past Medical History  Diagnosis Date  . HTN (hypertension)   . Fatigue   . CHF (congestive heart failure) 04/2011    EF 15-20% from echo 05/19/11  . DOE (dyspnea on exertion)   . Orthopnea   . DVT (deep venous thrombosis) 06/2011  . Anemia   . Menorrhagia     with iron deficient anemia    Past Surgical History  Procedure Date  . Tubal ligation 2006  . Cyst removed from ovary   . Coronary stent placement     Family History  Problem Relation Age of Onset  . Hypertension Mother   . Stroke Father     History  Substance Use Topics  . Smoking status: Former Smoker -- 0.5 packs/day for 5 years    Types: Cigarettes    Quit date: 10/19/1993  . Smokeless tobacco: Not on file  . Alcohol Use: No    OB History    Grav Para Term Preterm Abortions TAB SAB Ect Mult Living                  Review of Systems  Constitutional: Negative for fever, chills and fatigue.  HENT: Negative for ear pain, sore throat, rhinorrhea, sneezing, postnasal drip and sinus pressure.   Respiratory: Positive for cough. Negative for shortness of breath and wheezing.   Cardiovascular: Negative for chest pain, palpitations and leg swelling.    Allergies  Detrol  Home Medications   Current Outpatient Rx  Name Route Sig Dispense Refill  . OVER THE COUNTER MEDICATION  robitussin    .  ALBUTEROL SULFATE HFA 108 (90 BASE) MCG/ACT IN AERS Inhalation Inhale 2 puffs into the lungs every 6 (six) hours as needed. For shortness of breath.    . ASPIRIN 81 MG PO TABS Oral Take 81 mg by mouth daily.      Marland Kitchen BENZONATATE 100 MG PO CAPS  1-2 caps every 8 hrs prn cough 30 capsule 0  . CARVEDILOL 6.25 MG PO TABS Oral Take 6.25 mg by mouth 2 (two) times daily.      . IRON 325 (65 FE) MG PO TABS Oral Take 1 tablet by mouth 2 (two) times daily.      Marland Kitchen HYDRALAZINE HCL 50 MG PO TABS Oral Take 50 mg by mouth 3 (three) times daily.      Marland Kitchen HYDROCHLOROTHIAZIDE 12.5 MG PO CAPS Oral Take 12.5 mg by mouth daily.    . ISOSORBIDE MONONITRATE ER 60 MG PO TB24 Oral Take 60 mg by mouth daily.      Marland Kitchen LISINOPRIL 20 MG PO TABS Oral Take 20 mg by mouth daily.    Marland Kitchen PANTOPRAZOLE SODIUM 40 MG PO TBEC Oral Take 40 mg by mouth daily.    Marland Kitchen PRASUGREL HCL 10 MG PO TABS Oral Take 10 mg by mouth daily.      BP 109/56  Pulse 80  Temp(Src) 97.6 F (36.4  C) (Oral)  Resp 16  SpO2 99%  LMP 12/17/2011  Physical Exam  Nursing note and vitals reviewed. Constitutional: She appears well-developed and well-nourished. No distress.  HENT:  Head: Normocephalic and atraumatic.  Right Ear: Tympanic membrane, external ear and ear canal normal.  Left Ear: Tympanic membrane, external ear and ear canal normal.  Nose: Nose normal.  Mouth/Throat: Uvula is midline, oropharynx is clear and moist and mucous membranes are normal. No oropharyngeal exudate, posterior oropharyngeal edema or posterior oropharyngeal erythema.  Neck: Neck supple.  Cardiovascular: Normal rate, regular rhythm and normal heart sounds.   Pulmonary/Chest: Effort normal and breath sounds normal. No respiratory distress.  Lymphadenopathy:    She has no cervical adenopathy.  Neurological: She is alert.  Skin: Skin is warm and dry.  Psychiatric: She has a normal mood and affect.    ED Course  Procedures (including critical care time)  Labs Reviewed - No data  to display No results found.   1. Cough       MDM  Nonproductive cough x 3-4 days. Discussed with pt probable viral infection with recent onset. If symptoms persist f/u with PCP or cardiologist, and consider Lisinopril as cause.        Melody Comas, [place] 01/07/12 1352

## 2012-01-07 NOTE — ED Notes (Signed)
C/o dry cough, onset 4 days.  Reports coughing episodes aggravate soreness on left torso, below left breast.  Denies any sputum.  Patient reports aggravating cough

## 2012-01-07 NOTE — ED Notes (Signed)
Requested patient to put on gown for physician examinaltion

## 2012-01-18 NOTE — ED Provider Notes (Signed)
Medical screening examination/treatment/procedure(s) were performed by non-physician practitioner and as supervising physician I was immediately available for consultation/collaboration.  Taysean Wager G  D.O.    Randa Spike, MD 01/18/12 [phone]

## 2012-02-03 ENCOUNTER — Emergency Department (HOSPITAL_COMMUNITY): Payer: Medicaid Other

## 2012-02-03 ENCOUNTER — Emergency Department (HOSPITAL_COMMUNITY)
Admission: EM | Admit: 2012-02-03 | Discharge: 2012-02-03 | Disposition: A | Discharge status: Home / Self Care | Payer: Medicaid Other | Attending: Emergency Medicine | Admitting: Emergency Medicine

## 2012-02-03 ENCOUNTER — Encounter (HOSPITAL_COMMUNITY): Payer: Self-pay | Admitting: *Deleted

## 2012-02-03 DIAGNOSIS — R059 Cough, unspecified: Secondary | ICD-10-CM | POA: Insufficient documentation

## 2012-02-03 DIAGNOSIS — R05 Cough: Secondary | ICD-10-CM | POA: Insufficient documentation

## 2012-02-03 DIAGNOSIS — I10 Essential (primary) hypertension: Secondary | ICD-10-CM | POA: Insufficient documentation

## 2012-02-03 MED ORDER — HYDROCOD POLST-CHLORPHEN POLST 10-8 MG/5ML PO LQCR: 5.0000 mL | Freq: Two times a day (BID) | ORAL | Status: DC | PRN

## 2012-02-03 NOTE — Discharge Instructions (Signed)
Take tussionex as need for cough - may cause drowsiness, no driving when taking.  As lisinopril can cause a chronic cough, hold/stop this medication for now.  Follow up with primary care doctor in 1 week if symptoms fail to improve/resolve - discuss your medications with them at that follow up visit.  Return to ER if worse, difficulty breathing, fevers, other concern.      Cough, Adult  A cough is a reflex that helps clear your throat and airways. It can help heal the body or may be a reaction to an irritated airway. A cough may only last 2 or 3 weeks (acute) or may last more than 8 weeks (chronic).  CAUSES Acute cough:  Viral or bacterial infections.  Chronic cough:  Infections.   Allergies.   Asthma.   Post-nasal drip.   Smoking.   Heartburn or acid reflux.   Some medicines.   Chronic lung problems (COPD).   Cancer.  SYMPTOMS   Cough.   Fever.   Chest pain.   Increased breathing rate.   High-pitched whistling sound when breathing (wheezing).   Colored mucus that you cough up (sputum).  TREATMENT   A bacterial cough may be treated with antibiotic medicine.   A viral cough must run its course and will not respond to antibiotics.   Your caregiver may recommend other treatments if you have a chronic cough.  HOME CARE INSTRUCTIONS   Only take over-the-counter or prescription medicines for pain, discomfort, or fever as directed by your caregiver. Use cough suppressants only as directed by your caregiver.   Use a cold steam vaporizer or humidifier in your bedroom or home to help loosen secretions.   Sleep in a semi-upright position if your cough is worse at night.   Rest as needed.   Stop smoking if you smoke.  SEEK IMMEDIATE MEDICAL CARE IF:   You have pus in your sputum.   Your cough starts to worsen.   You cannot control your cough with suppressants and are losing sleep.   You begin coughing up blood.   You have difficulty breathing.   You  develop pain which is getting worse or is uncontrolled with medicine.   You have a fever.  MAKE SURE YOU:   Understand these instructions.   Will watch your condition.   Will get help right away if you are not doing well or get worse.  Document Released: 04/03/2011 Document Revised: 09/24/2011 Document Reviewed: 04/03/2011 [facility] Patient Information 2012 [place], [place].Cough, Adult  A cough is a reflex that helps clear your throat and airways. It can help heal the body or may be a reaction to an irritated airway. A cough may only last 2 or 3 weeks (acute) or may last more than 8 weeks (chronic).  CAUSES Acute cough:  Viral or bacterial infections.  Chronic cough:  Infections.   Allergies.   Asthma.   Post-nasal drip.   Smoking.   Heartburn or acid reflux.   Some medicines.   Chronic lung problems (COPD).   Cancer.  SYMPTOMS   Cough.   Fever.   Chest pain.   Increased breathing rate.   High-pitched whistling sound when breathing (wheezing).   Colored mucus that you cough up (sputum).  TREATMENT   A bacterial cough may be treated with antibiotic medicine.   A viral cough must run its course and will not respond to antibiotics.   Your caregiver may recommend other treatments if you have a chronic cough.  HOME  CARE INSTRUCTIONS   Only take over-the-counter or prescription medicines for pain, discomfort, or fever as directed by your caregiver. Use cough suppressants only as directed by your caregiver.   Use a cold steam vaporizer or humidifier in your bedroom or home to help loosen secretions.   Sleep in a semi-upright position if your cough is worse at night.   Rest as needed.   Stop smoking if you smoke.  SEEK IMMEDIATE MEDICAL CARE IF:   You have pus in your sputum.   Your cough starts to worsen.   You cannot control your cough with suppressants and are losing sleep.   You begin coughing up blood.   You have difficulty breathing.   You  develop pain which is getting worse or is uncontrolled with medicine.   You have a fever.  MAKE SURE YOU:   Understand these instructions.   Will watch your condition.   Will get help right away if you are not doing well or get worse.  Document Released: 04/03/2011 Document Revised: 09/24/2011 Document Reviewed: 04/03/2011 [facility] Patient Information 2012 [place], [place].

## 2012-02-03 NOTE — ED Notes (Signed)
Pt is on lisinopril and started that one year ago

## 2012-02-03 NOTE — ED Provider Notes (Signed)
History     CSN: [id number]  Arrival date & time 02/03/12  1100   First MD Initiated Contact with Patient 02/03/12 1133      Chief Complaint  Patient presents with  . Cough  pt c/o non productive cough for past week. No chest pain or sob. No sore throat, runny nose or other uri c/o. No fever or chills. No body aches or headaches. Denies hx asthma. Former smoker. Denies allergy to pollens or similar symptoms this time of year in past. No leg pain or swelling. No dyspnea. No orthopnea or pnd. Pt unaware of exacerbating or alleviating factors. States occurs both day and night.   (Consider location/radiation/quality/duration/timing/severity/associated sxs/prior treatment) Patient is a 46 y.o. female presenting with cough. The history is provided by the patient.  Cough Pertinent negatives include no chest pain, no headaches, no rhinorrhea, no sore throat, no shortness of breath and no eye redness.    Past Medical History  Diagnosis Date  . HTN (hypertension)   . Fatigue   . CHF (congestive heart failure) 04/2011    EF 15-20% from echo 05/19/11  . DOE (dyspnea on exertion)   . Orthopnea   . DVT (deep venous thrombosis) 06/2011  . Anemia   . Menorrhagia     with iron deficient anemia    Past Surgical History  Procedure Date  . Tubal ligation 2006  . Cyst removed from ovary   . Coronary stent placement     Family History  Problem Relation Age of Onset  . Hypertension Mother   . Stroke Father     History  Substance Use Topics  . Smoking status: Former Smoker -- 0.5 packs/day for 5 years    Types: Cigarettes    Quit date: 10/19/1993  . Smokeless tobacco: Not on file  . Alcohol Use: No    OB History    Grav Para Term Preterm Abortions TAB SAB Ect Mult Living                  Review of Systems  Constitutional: Negative for fever.  HENT: Negative for congestion, sore throat, rhinorrhea, neck pain and postnasal drip.   Eyes: Negative for redness.  Respiratory:  Positive for cough. Negative for shortness of breath.   Cardiovascular: Negative for chest pain.  Gastrointestinal: Negative for nausea, vomiting and abdominal pain.  Genitourinary: Negative for flank pain.  Musculoskeletal: Negative for back pain.  Skin: Negative for rash.  Neurological: Negative for headaches.  Hematological: Does not bruise/bleed easily.  Psychiatric/Behavioral: Negative for confusion.    Allergies  Detrol  Home Medications   Current Outpatient Rx  Name Route Sig Dispense Refill  . ALBUTEROL SULFATE HFA 108 (90 BASE) MCG/ACT IN AERS Inhalation Inhale 2 puffs into the lungs every 6 (six) hours as needed. For shortness of breath.    . ASPIRIN 81 MG PO TABS Oral Take 81 mg by mouth daily.      Marland Kitchen CARVEDILOL 6.25 MG PO TABS Oral Take 6.25 mg by mouth 2 (two) times daily.      . IRON 325 (65 FE) MG PO TABS Oral Take 1 tablet by mouth 2 (two) times daily.      Marland Kitchen HYDRALAZINE HCL 50 MG PO TABS Oral Take 50 mg by mouth 3 (three) times daily.      Marland Kitchen HYDROCHLOROTHIAZIDE 12.5 MG PO CAPS Oral Take 12.5 mg by mouth daily.    . ISOSORBIDE MONONITRATE ER 60 MG PO TB24 Oral Take 60 mg  by mouth daily.      Marland Kitchen LISINOPRIL 20 MG PO TABS Oral Take 20 mg by mouth daily.    Marland Kitchen PRASUGREL HCL 10 MG PO TABS Oral Take 10 mg by mouth daily.    Marland Kitchen PRAVASTATIN SODIUM 40 MG PO TABS Oral Take 40 mg by mouth daily.      BP 100/55  Pulse 82  Temp(Src) 98.3 F (36.8 C) (Oral)  Resp 18  SpO2 99%  LMP 01/17/2012  Physical Exam  Nursing note and vitals reviewed. Constitutional: She is oriented to person, place, and time. She appears well-developed and well-nourished. No distress.  HENT:  Nose: Nose normal.  Mouth/Throat: Oropharynx is clear and moist.  Eyes: Conjunctivae are normal. No scleral icterus.  Neck: Neck supple. No tracheal deviation present.  Cardiovascular: Normal rate, regular rhythm, normal heart sounds and intact distal pulses.   Pulmonary/Chest: Effort normal and breath sounds  normal. No respiratory distress.  Abdominal: Soft. Normal appearance and bowel sounds are normal. She exhibits no distension.  Musculoskeletal: She exhibits no edema and no tenderness.  Neurological: She is alert and oriented to person, place, and time.  Skin: Skin is warm and dry. No rash noted.  Psychiatric: She has a normal mood and affect.    ED Course  Procedures (including critical care time)  Labs Reviewed - No data to display Dg Chest 2 View  02/03/2012  *RADIOLOGY REPORT*  Clinical Data: Dry cough for 1 week with subsequent chest pain. Remote smoker.  CHEST - 2 VIEW  Comparison: 11/01/2011 radiographs and CT.  Findings: The heart size and mediastinal contours are normal. The lungs are clear. There is no pleural effusion or pneumothorax. No acute osseous findings are identified.  IMPRESSION: Stable examination.  No active cardiopulmonary process.  Original Report Authenticated By: Gerrianne Scale, M.D.       MDM  No fever, chest cta, cxr neg, sats 99, no inc wob - no evidence acute bacterial pna or bronchitis. Discussed diff incl viral process, allergy related,  related to ace inh use given prior records w persistent cough, etc.   Will rx cough med, as bp 100/55 trial holding lisinopril, and close pcp f/u.         Suzi Roots, MD 02/03/12 1226

## 2012-02-03 NOTE — ED Notes (Signed)
Pt sts symptoms started last week and feels like something is in her throat and triggers coughing spells.    Nonproductive.  No fever.    Pt had some sob

## 2012-03-01 ENCOUNTER — Encounter (HOSPITAL_COMMUNITY): Payer: Self-pay

## 2012-03-01 ENCOUNTER — Ambulatory Visit (HOSPITAL_COMMUNITY): Admit: 2012-03-01 | Payer: Medicaid Other | Admitting: Cardiology

## 2012-03-01 SURGERY — LEFT HEART CATHETERIZATION WITH CORONARY ANGIOGRAM
Anesthesia: LOCAL

## 2012-03-18 ENCOUNTER — Ambulatory Visit: Payer: Medicaid Other | Admitting: Cardiovascular Disease

## 2012-04-11 ENCOUNTER — Other Ambulatory Visit: Payer: Self-pay | Admitting: Family Medicine

## 2012-04-11 DIAGNOSIS — N6459 Other signs and symptoms in breast: Secondary | ICD-10-CM

## 2012-04-13 ENCOUNTER — Emergency Department (HOSPITAL_COMMUNITY): Payer: Medicaid Other

## 2012-04-13 ENCOUNTER — Encounter (HOSPITAL_COMMUNITY): Payer: Self-pay | Admitting: Physical Medicine and Rehabilitation

## 2012-04-13 ENCOUNTER — Emergency Department (HOSPITAL_COMMUNITY)
Admission: EM | Admit: 2012-04-13 | Discharge: 2012-04-13 | Disposition: A | Discharge status: Home / Self Care | Payer: Medicaid Other | Attending: Emergency Medicine | Admitting: Emergency Medicine

## 2012-04-13 DIAGNOSIS — M898X9 Other specified disorders of bone, unspecified site: Secondary | ICD-10-CM | POA: Insufficient documentation

## 2012-04-13 DIAGNOSIS — Z87891 Personal history of nicotine dependence: Secondary | ICD-10-CM | POA: Insufficient documentation

## 2012-04-13 DIAGNOSIS — I509 Heart failure, unspecified: Secondary | ICD-10-CM | POA: Insufficient documentation

## 2012-04-13 DIAGNOSIS — W208XXA Other cause of strike by thrown, projected or falling object, initial encounter: Secondary | ICD-10-CM | POA: Insufficient documentation

## 2012-04-13 DIAGNOSIS — Z9861 Coronary angioplasty status: Secondary | ICD-10-CM | POA: Insufficient documentation

## 2012-04-13 DIAGNOSIS — I10 Essential (primary) hypertension: Secondary | ICD-10-CM | POA: Insufficient documentation

## 2012-04-13 DIAGNOSIS — M7730 Calcaneal spur, unspecified foot: Secondary | ICD-10-CM | POA: Insufficient documentation

## 2012-04-13 DIAGNOSIS — M79609 Pain in unspecified limb: Secondary | ICD-10-CM | POA: Insufficient documentation

## 2012-04-13 DIAGNOSIS — Z86718 Personal history of other venous thrombosis and embolism: Secondary | ICD-10-CM | POA: Insufficient documentation

## 2012-04-13 DIAGNOSIS — T148XXA Other injury of unspecified body region, initial encounter: Secondary | ICD-10-CM

## 2012-04-13 NOTE — ED Provider Notes (Signed)
History     CSN: [id number]  Arrival date & time 04/13/12  1936   None     Chief Complaint  Patient presents with  . Toe Pain    (Consider location/radiation/quality/duration/timing/severity/associated sxs/prior treatment) Patient is a 46 y.o. female presenting with toe pain. The history is provided by the patient. No language interpreter was used.  Toe Pain This is a new problem. The current episode started in the past 7 days. The problem occurs daily. The problem has been unchanged. Pertinent negatives include no fever, nausea, numbness, vomiting or weakness. Associated symptoms comments: Bruising . The symptoms are aggravated by walking. She has tried acetaminophen for the symptoms.   patient states that she dropped a can of tomatoes on her right great toe foot a week ago. States she has been walking on the foot but it hurts. Good CMS to the extremity. No deformity noted.  Past Medical History  Diagnosis Date  . HTN (hypertension)   . Fatigue   . CHF (congestive heart failure) 04/2011    EF 15-20% from echo 05/19/11  . DOE (dyspnea on exertion)   . Orthopnea   . DVT (deep venous thrombosis) 06/2011  . Anemia   . Menorrhagia     with iron deficient anemia    Past Surgical History  Procedure Date  . Tubal ligation 2006  . Cyst removed from ovary   . Coronary stent placement     Family History  Problem Relation Age of Onset  . Hypertension Mother   . Stroke Father     History  Substance Use Topics  . Smoking status: Former Smoker -- 0.5 packs/day for 5 years    Types: Cigarettes    Quit date: 10/19/1993  . Smokeless tobacco: Not on file  . Alcohol Use: No    OB History    Grav Para Term Preterm Abortions TAB SAB Ect Mult Living                  Review of Systems  Constitutional: Negative.  Negative for fever.  HENT: Negative.   Eyes: Negative.   Respiratory: Negative.   Cardiovascular: Negative.   Gastrointestinal: Negative.  Negative for nausea and  vomiting.  Skin:       bruising  Neurological: Negative.  Negative for weakness and numbness.  Psychiatric/Behavioral: Negative.   All other systems reviewed and are negative.    Allergies  Detrol  Home Medications   Current Outpatient Rx  Name Route Sig Dispense Refill  . ALBUTEROL SULFATE HFA 108 (90 BASE) MCG/ACT IN AERS Inhalation Inhale 2 puffs into the lungs every 6 (six) hours as needed. For shortness of breath.    . ASPIRIN 81 MG PO TABS Oral Take 81 mg by mouth daily.      Marland Kitchen CARVEDILOL 6.25 MG PO TABS Oral Take 6.25 mg by mouth 2 (two) times daily.      . IRON 325 (65 FE) MG PO TABS Oral Take 1 tablet by mouth 2 (two) times daily.      . FUROSEMIDE 20 MG PO TABS Oral Take 40 mg by mouth daily.    Marland Kitchen HYDRALAZINE HCL 50 MG PO TABS Oral Take 50 mg by mouth 3 (three) times daily.      . ISOSORBIDE MONONITRATE ER 60 MG PO TB24 Oral Take 60 mg by mouth daily.      Marland Kitchen LISINOPRIL 20 MG PO TABS Oral Take 20 mg by mouth daily.    Marland Kitchen PRASUGREL HCL  10 MG PO TABS Oral Take 10 mg by mouth daily.    Marland Kitchen PRAVASTATIN SODIUM 40 MG PO TABS Oral Take 40 mg by mouth daily.      BP 178/64  Pulse 87  Temp 98.1 F (36.7 C) (Oral)  Resp 18  SpO2 100%  Physical Exam  Nursing note and vitals reviewed. Constitutional: She is oriented to person, place, and time. She appears well-developed and well-nourished.  HENT:  Head: Normocephalic and atraumatic.  Eyes: Conjunctivae and EOM are normal. Pupils are equal, round, and reactive to light.  Neck: Normal range of motion. Neck supple.  Cardiovascular: Normal rate.   Pulmonary/Chest: Effort normal.  Abdominal: Soft.  Musculoskeletal: Normal range of motion. She exhibits edema and tenderness.       R foot  Neurological: She is alert and oriented to person, place, and time. She has normal reflexes.  Skin: Skin is warm and dry.  Psychiatric: She has a normal mood and affect.    ED Course  Procedures (including critical care time)  Labs Reviewed  - No data to display Dg Foot Complete Right  04/13/2012  *RADIOLOGY REPORT*  Clinical Data: Right foot injury with pain.  RIGHT FOOT COMPLETE - 3+ VIEW  Comparison: None.  Findings: Degenerative spurring noted at the first metatarsophalangeal joint and along the sesamoids.  Mild hallux valgus noted.  A slightly indistinct calcification just proximal to the medial navicular probably represents an accessory navicular, but is technically nonspecific due to its indistinct morphology - correlate with any special tenderness in this vicinity.  Plantar and Achilles calcaneal spurs noted.  There is mild spurring along the posterior subtalar joint.  Mild dorsal soft tissue swelling noted along the foot.  IMPRESSION:  1.  No discrete fracture is identified.  A slightly amorphous calcification just proximal to the medial navicular probably represents an accessory navicular rather than an avulsion injury. 2.  Degenerative spurring at the first MTP joint and along the sesamoids. 3.  Plantar and Achilles calcaneal spurs.  There is also spurring of the posterior subtalar joint. 4.  If symptoms persist despite conservative therapy, MRI followup may be warranted.  Original Report Authenticated By: Dellia Cloud, M.D.     No diagnosis found.    MDM  Right foot bruising after dropping a can of tomatoes on it one week ago. Post op boot and crutches provided. Will followup with PCP as needed. Ice and elevate.          Remi Haggard, NP 04/13/12 2147

## 2012-04-13 NOTE — ED Notes (Signed)
Pt presents to department for evaluation of L foot pain. States she dropped a can of tomatoes on L foot x1 week ago. States pain has increased, 7/10 at the time. Bruising noted to top of foot. Sensation intact. Able to wiggle digits. Pedal pulses present. Ambulatory to triage. No obvious deformities noted. She is alert and oriented x4.

## 2012-04-13 NOTE — Progress Notes (Signed)
Orthopedic Tech Progress Note Patient Details:  [name] June 26, 1966 [id number]  Ortho Devices Type of Ortho Device: Crutches;Postop boot Ortho Device/Splint Location: Right foot Ortho Device/Splint Interventions: Application   Asia R Thompson 04/13/2012, 9:05 PM

## 2012-04-13 NOTE — Discharge Instructions (Signed)
[name] the x-ray shows no fx to the toe or foot.  Follow up with your pcp if not better in 5 days.  Ice and elevate.  Take the tylenol for pain.  Return for severe pain or other concerns.   Cryotherapy Cryotherapy means treatment with cold. Ice or gel packs can be used to reduce both pain and swelling. Ice is the most helpful within the first 24 to 48 hours after an injury or flareup from overusing a muscle or joint. Sprains, strains, spasms, burning pain, shooting pain, and aches can all be eased with ice. Ice can also be used when recovering from surgery. Ice is effective, has very few side effects, and is safe for most people to use. PRECAUTIONS  Ice is not a safe treatment option for people with:  Raynaud's phenomenon. This is a condition affecting small blood vessels in the extremities. Exposure to cold may cause your problems to return.   Cold hypersensitivity. There are many forms of cold hypersensitivity, including:   Cold urticaria. Red, itchy hives appear on the skin when the tissues begin to warm after being iced.   Cold erythema. This is a red, itchy rash caused by exposure to cold.   Cold hemoglobinuria. Red blood cells break down when the tissues begin to warm after being iced. The hemoglobin that carry oxygen are passed into the urine because they cannot combine with blood proteins fast enough.   Numbness or altered sensitivity in the area being iced.  If you have any of the following conditions, do not use ice until you have discussed cryotherapy with your caregiver:  Heart conditions, such as arrhythmia, angina, or chronic heart disease.   High blood pressure.   Healing wounds or open skin in the area being iced.   Current infections.   Rheumatoid arthritis.   Poor circulation.   Diabetes.  Ice slows the blood flow in the region it is applied. This is beneficial when trying to stop inflamed tissues from spreading irritating chemicals to surrounding tissues.  However, if you expose your skin to cold temperatures for too long or without the proper protection, you can damage your skin or nerves. Watch for signs of skin damage due to cold. HOME CARE INSTRUCTIONS Follow these tips to use ice and cold packs safely.  Place a dry or damp towel between the ice and skin. A damp towel will cool the skin more quickly, so you may need to shorten the time that the ice is used.   For a more rapid response, add gentle compression to the ice.   Ice for no more than 10 to 20 minutes at a time. The bonier the area you are icing, the less time it will take to get the benefits of ice.   Check your skin after 5 minutes to make sure there are no signs of a poor response to cold or skin damage.   Rest 20 minutes or more in between uses.   Once your skin is numb, you can end your treatment. You can test numbness by very lightly touching your skin. The touch should be so light that you do not see the skin dimple from the pressure of your fingertip. When using ice, most people will feel these normal sensations in this order: cold, burning, aching, and numbness.   Do not use ice on someone who cannot communicate their responses to pain, such as small children or people with dementia.  HOW TO MAKE AN ICE PACK Ice  packs are the most common way to use ice therapy. Other methods include ice massage, ice baths, and cryo-sprays. Muscle creams that cause a cold, tingly feeling do not offer the same benefits that ice offers and should not be used as a substitute unless recommended by your caregiver. To make an ice pack, do one of the following:  Place crushed ice or a bag of frozen vegetables in a sealable plastic bag. Squeeze out the excess air. Place this bag inside another plastic bag. Slide the bag into a pillowcase or place a damp towel between your skin and the bag.   Mix 3 parts water with 1 part rubbing alcohol. Freeze the mixture in a sealable plastic bag. When you remove  the mixture from the freezer, it will be slushy. Squeeze out the excess air. Place this bag inside another plastic bag. Slide the bag into a pillowcase or place a damp towel between your skin and the bag.  SEEK MEDICAL CARE IF:  You develop white spots on your skin. This may give the skin a blotchy (mottled) appearance.   Your skin turns blue or pale.   Your skin becomes waxy or hard.   Your swelling gets worse.  MAKE SURE YOU:   Understand these instructions.   Will watch your condition.   Will get help right away if you are not doing well or get worse.  Document Released: 06/01/2011 Document Revised: 09/24/2011 Document Reviewed: 06/01/2011 [facility] Patient Information 2012 [place], [place].Contusion A contusion is a deep bruise. Contusions happen when an injury causes bleeding under the skin. Signs of bruising include pain, puffiness (swelling), and discolored skin. The contusion may turn blue, purple, or yellow. HOME CARE   Put ice on the injured area.   Put ice in a plastic bag.   Place a towel between your skin and the bag.   Leave the ice on for 15 to 20 minutes, 3 to 4 times a day.   Only take medicine as told by your doctor.   Rest the injured area.   If possible, raise (elevate) the injured area to lessen puffiness.  GET HELP RIGHT AWAY IF:   You have more bruising or puffiness.   You have pain that is getting worse.   Your puffiness or pain is not helped by medicine.  MAKE SURE YOU:   Understand these instructions.   Will watch your condition.   Will get help right away if you are not doing well or get worse.  Document Released: 03/23/2008 Document Revised: 09/24/2011 Document Reviewed: 08/10/2011 [facility] Patient Information 2012 [place], [place].Contusion A contusion is a deep bruise. Contusions happen when an injury causes bleeding under the skin. Signs of bruising include pain, puffiness (swelling), and discolored skin. The contusion may turn blue,  purple, or yellow. HOME CARE   Put ice on the injured area.   Put ice in a plastic bag.   Place a towel between your skin and the bag.   Leave the ice on for 15 to 20 minutes, 3 to 4 times a day.   Only take medicine as told by your doctor.   Rest the injured area.   If possible, raise (elevate) the injured area to lessen puffiness.  GET HELP RIGHT AWAY IF:   You have more bruising or puffiness.   You have pain that is getting worse.   Your puffiness or pain is not helped by medicine.  MAKE SURE YOU:   Understand these instructions.   Will watch  your condition.   Will get help right away if you are not doing well or get worse.  Document Released: 03/23/2008 Document Revised: 09/24/2011 Document Reviewed: 08/10/2011 [facility] Patient Information 2012 [place], [place].

## 2012-04-13 NOTE — ED Provider Notes (Signed)
Medical screening examination/treatment/procedure(s) were performed by non-physician practitioner and as supervising physician I was immediately available for consultation/collaboration.   Colum Colt, MD 04/13/12 2317 

## 2012-04-15 ENCOUNTER — Emergency Department (HOSPITAL_COMMUNITY): Payer: Medicaid Other

## 2012-04-15 ENCOUNTER — Emergency Department (HOSPITAL_COMMUNITY)
Admission: EM | Admit: 2012-04-15 | Discharge: 2012-04-15 | Disposition: A | Discharge status: Home / Self Care | Payer: Medicaid Other | Attending: Emergency Medicine | Admitting: Emergency Medicine

## 2012-04-15 ENCOUNTER — Ambulatory Visit
Admission: RE | Admit: 2012-04-15 | Discharge: 2012-04-15 | Disposition: A | Discharge status: Home / Self Care | Payer: Medicaid Other | Source: Ambulatory Visit | Attending: Family Medicine | Admitting: Family Medicine

## 2012-04-15 DIAGNOSIS — Z79899 Other long term (current) drug therapy: Secondary | ICD-10-CM | POA: Insufficient documentation

## 2012-04-15 DIAGNOSIS — Z86718 Personal history of other venous thrombosis and embolism: Secondary | ICD-10-CM | POA: Insufficient documentation

## 2012-04-15 DIAGNOSIS — N920 Excessive and frequent menstruation with regular cycle: Secondary | ICD-10-CM | POA: Insufficient documentation

## 2012-04-15 DIAGNOSIS — Z7982 Long term (current) use of aspirin: Secondary | ICD-10-CM | POA: Insufficient documentation

## 2012-04-15 DIAGNOSIS — D500 Iron deficiency anemia secondary to blood loss (chronic): Secondary | ICD-10-CM | POA: Insufficient documentation

## 2012-04-15 DIAGNOSIS — N6459 Other signs and symptoms in breast: Secondary | ICD-10-CM

## 2012-04-15 DIAGNOSIS — R55 Syncope and collapse: Secondary | ICD-10-CM

## 2012-04-15 DIAGNOSIS — I10 Essential (primary) hypertension: Secondary | ICD-10-CM | POA: Insufficient documentation

## 2012-04-15 DIAGNOSIS — I509 Heart failure, unspecified: Secondary | ICD-10-CM | POA: Insufficient documentation

## 2012-04-15 LAB — POCT I-STAT, CHEM 8
BUN: 13 mg/dL (ref 6–23)
Calcium, Ion: 1.23 mmol/L (ref 1.12–1.32)
Glucose, Bld: 135 mg/dL — ABNORMAL HIGH (ref 70–99)
HCT: 31 % — ABNORMAL LOW (ref 36.0–46.0)
TCO2: 24 mmol/L (ref 0–100)

## 2012-04-15 LAB — POCT PREGNANCY, URINE: Preg Test, Ur: NEGATIVE

## 2012-04-15 NOTE — ED Provider Notes (Signed)
History     CSN: [id number]  Arrival date & time 04/15/12  1715   First MD Initiated Contact with Patient 04/15/12 1725      Chief Complaint  Patient presents with  . Near Syncope    (Consider location/radiation/quality/duration/timing/severity/associated sxs/prior treatment) HPI  Patient with past medical history of hypertension, CHF, and DVT presents today status post syncopal episode. The patient was standing for a mammogram when she became warm, diaphoretic, bit lightheaded. The patient didn't loss consciousness and woke up on the floor. She denies any prodrome of chest pain or palpitations. She reports no recent trauma. She denies any recent fever chills nausea vomiting diarrhea. She denies any recent dysuria. She feels very good right now. She calls her illness mild. Her vital signs were stable on arrival.  Past Medical History  Diagnosis Date  . HTN (hypertension)   . Fatigue   . CHF (congestive heart failure) 04/2011    EF 15-20% from echo 05/19/11  . DOE (dyspnea on exertion)   . Orthopnea   . DVT (deep venous thrombosis) 06/2011  . Anemia   . Menorrhagia     with iron deficient anemia    Past Surgical History  Procedure Date  . Tubal ligation 2006  . Cyst removed from ovary   . Coronary stent placement     Family History  Problem Relation Age of Onset  . Hypertension Mother   . Stroke Father     History  Substance Use Topics  . Smoking status: Former Smoker -- 0.5 packs/day for 5 years    Types: Cigarettes    Quit date: 10/19/1993  . Smokeless tobacco: Not on file  . Alcohol Use: No    OB History    Grav Para Term Preterm Abortions TAB SAB Ect Mult Living                  Review of Systems Constitutional: Negative for fever and chills.  HENT: Negative for ear pain, sore throat and trouble swallowing.   Eyes: Negative for pain and visual disturbance.  Respiratory: Negative for cough and shortness of breath.   Cardiovascular: Negative for chest  pain and leg swelling.  Gastrointestinal: Negative for nausea, vomiting, abdominal pain and diarrhea.  Genitourinary: Negative for dysuria, urgency and frequency.  Musculoskeletal: Negative for back pain and joint swelling.  Skin: Negative for rash and wound.  Neurological: Negative for dizziness, POS syncope, speech difficulty, weakness and numbness.   Allergies  Detrol  Home Medications   Current Outpatient Rx  Name Route Sig Dispense Refill  . ALBUTEROL SULFATE HFA 108 (90 BASE) MCG/ACT IN AERS Inhalation Inhale 2 puffs into the lungs every 6 (six) hours as needed. For shortness of breath.    . ASPIRIN 81 MG PO TABS Oral Take 81 mg by mouth every morning.     Marland Kitchen CARVEDILOL 6.25 MG PO TABS Oral Take 6.25 mg by mouth 2 (two) times daily.      . IRON 325 (65 FE) MG PO TABS Oral Take 1 tablet by mouth 2 (two) times daily.      . FUROSEMIDE 20 MG PO TABS Oral Take 40 mg by mouth every morning.     Marland Kitchen HYDRALAZINE HCL 50 MG PO TABS Oral Take 50 mg by mouth 3 (three) times daily.      . ISOSORBIDE MONONITRATE ER 60 MG PO TB24 Oral Take 60 mg by mouth every morning.     Marland Kitchen LISINOPRIL 20 MG PO TABS Oral  Take 20 mg by mouth every morning.     Marland Kitchen PRASUGREL HCL 10 MG PO TABS Oral Take 10 mg by mouth every morning.     Marland Kitchen PRAVASTATIN SODIUM 40 MG PO TABS Oral Take 40 mg by mouth every evening.       BP 142/66  Pulse 93  Temp 97.8 F (36.6 C) (Oral)  Resp 25  SpO2 100%  LMP 01/17/2012  Physical Exam Consitutional: Pt in no acute distress.   Head: Normocephalic and atraumatic.  Eyes: Extraocular motion intact, no scleral icterus Neck: Supple without meningismus, mass, or overt JVD Respiratory: Effort normal and breath sounds normal. No respiratory distress. CV: Heart regular rate and rhythm, no obvious murmurs.  Pulses +2 and symmetric Abdomen: Soft, non-tender, non-distended MSK: Extremities are atraumatic without deformity, ROM intact Skin: Warm, dry, intact Neuro: Alert and oriented, no  motor deficit noted.  CNs intact.  PERRL.  Reflexes normal BLE, no clonus.  Hips, knee and ankle, arm and wrist strength maintained.  FTN and RAM normal.  CNs 2-12 normal.  Psychiatric: Mood and affect are normal  EKG:  Rate: 77 Rythym  Sinus  Interval  160 ms. Axis: normal No gross conduction abnormalities appreciated.  No gross ST or T-wave abnormalities appreciated.  Essentially unchanged.      ED Course  Procedures (including critical care time)  Labs Reviewed  POCT I-STAT, CHEM 8 - Abnormal; Notable for the following:    Glucose, Bld 135 (*)     Hemoglobin 10.5 (*)     HCT 31.0 (*)     All other components within normal limits  POCT PREGNANCY, URINE   Dg Chest 2 View  04/15/2012  *RADIOLOGY REPORT*  Clinical Data: Near syncope.  CHEST - 2 VIEW  Comparison: Chest x-ray 02/03/2012.  Findings: Lung volumes are normal.  No consolidative airspace disease.  No pleural effusions.  No pneumothorax.  No pulmonary nodule or mass noted.  Pulmonary vasculature and the cardiomediastinal silhouette are within normal limits.  IMPRESSION: 1. No radiographic evidence of acute cardiopulmonary disease.  Original Report Authenticated By: Florencia Reasons, M.D.   US Breast Left  04/15/2012  *RADIOLOGY REPORT*  Clinical Data:  Palpable lump in the upper outer quadrant of the left breast.  Baseline examination.  DIGITAL DIAGNOSTIC BILATERAL MAMMOGRAM WITH CAD AND LEFT BREAST ULTRASOUND:  Comparison:  None.  Findings:  CC and MLO views of both breasts were obtained.  Almost entirely fatty breast tissue.  No new suspicious mass, nonsurgical architectural distortion, or suspicious calcifications in either breast.  Specifically, no mammographic abnormalities in the upper outer quadrant of the left breast. Mammographic images were processed with CAD.  On physical exam, I palpate no mass in the upper outer left breast.  Ultrasound is performed, showing normal fatty and fibroglandular breast tissue in the  upper outer quadrant of the left breast.  No cyst, solid mass, or abnormal acoustic shadowing was identified.  IMPRESSION:  1.  No mammographic or sonographic evidence of malignancy, left breast. 2.  No mammographic evidence of malignancy, right breast.  RECOMMENDATION: Screening mammogram in one year. (Code:SM-B-01Y)  The patient was encouraged to perform monthly self breast examination and communicate any changes with her primary physician. Findings and recommendations were discussed with the patient and provided in written form at the time of the exam.  BI-RADS CATEGORY 1:  Negative.  The patient experienced a probable vasovagal episode when the final left MLO image was obtained, while in compression.  The  patient denied injury as a result of the fall.  Her vital signs were normal approximately 10 minutes later, with a blood pressure of 118/68 and a pulse rate of 68, and the patient stated that she was beginning to feel better at that time.  However, the patient never felt completely normal, so she elected to to be taken to the emergency department by ambulance.  Original Report Authenticated By: Arnell Sieving, M.D.     1. Syncope       MDM  Excellent story for vagal syncope.  She had classic prodrome. She and her story does not suggest cardiac causes. She does have a DVT. There is no evidence here of suggestion even that she has a pulmonary embolus at this time. She has a long history of congestive heart failure with dyspnea on exertion. However she has not had that symptom recently arguing strongly against a pulmonary embolus. Further her vital signs did not suggest pulmonary embolus despite the fact that she has heart failure. Her heart rate is 77. She has no sense of dyspnea. Her pressure is normal. Her laboratories today are normal. Chest x-ray unremarkable. EKG unchanged. Discharged home to followup with primary care.       Larrie Kass, MD 04/15/12 [phone]

## 2012-04-15 NOTE — ED Notes (Addendum)
Pt was at breast center having mammogram when had near syncopal episode.  Was able to finish mammogram.  EMS states pt Vitals  120/70 all vitals normal upon arrival.  Pt hx of hypertension and CHF.  Denies diabetes.  Pt alert orientedX 4

## 2012-04-15 NOTE — Discharge Instructions (Signed)
Return to the ED with any concerns including chest pain, shortness of breath, leg swelling, recurrent fainting, decreased level of alertness/lethargy, or any other alarming symptoms

## 2012-04-15 NOTE — ED Notes (Signed)
Gave pt and children warm blankets and snacks.

## 2012-04-15 NOTE — ED Notes (Signed)
Stood patient up to do VS and became dizzy, Instructed to look forward and take deep slow breaths

## 2012-04-15 NOTE — ED Notes (Signed)
Ambulated patient around POD C and D.  No complaints of SOB.  Patient states she feels better.

## 2012-04-15 NOTE — ED Provider Notes (Signed)
History     CSN: [id number]  Arrival date & time 04/15/12  1715   First MD Initiated Contact with Patient 04/15/12 1725      Chief Complaint  Patient presents with  . Near Syncope    (Consider location/radiation/quality/duration/timing/severity/associated sxs/prior treatment) HPI Patient presents after syncopal event today. She states that she was having a mammogram performed and she felt flushed and lightheaded and fainted. She denies having any chest pain or shortness of breath. She has no leg swelling. She feels back to her baseline upon arrival to the ED period she denies any recent illnesses including no fevers or coughs, no vomiting or diarrhea. She states that she has eaten a normal diet today and has been drinking liquids. She denies any change to her Lasix over the past several months.  There are no other alleviating or modifying factors, there are no other associated systemic symptoms  Past Medical History  Diagnosis Date  . HTN (hypertension)   . Fatigue   . CHF (congestive heart failure) 04/2011    EF 15-20% from echo 05/19/11  . DOE (dyspnea on exertion)   . Orthopnea   . DVT (deep venous thrombosis) 06/2011  . Anemia   . Menorrhagia     with iron deficient anemia    Past Surgical History  Procedure Date  . Tubal ligation 2006  . Cyst removed from ovary   . Coronary stent placement     Family History  Problem Relation Age of Onset  . Hypertension Mother   . Stroke Father     History  Substance Use Topics  . Smoking status: Former Smoker -- 0.5 packs/day for 5 years    Types: Cigarettes    Quit date: 10/19/1993  . Smokeless tobacco: Not on file  . Alcohol Use: No    OB History    Grav Para Term Preterm Abortions TAB SAB Ect Mult Living                  Review of Systems ROS reviewed and all otherwise negative except for mentioned in HPI  Allergies  Detrol  Home Medications   Current Outpatient Rx  Name Route Sig Dispense Refill  .  ALBUTEROL SULFATE HFA 108 (90 BASE) MCG/ACT IN AERS Inhalation Inhale 2 puffs into the lungs every 6 (six) hours as needed. For shortness of breath.    . ASPIRIN 81 MG PO TABS Oral Take 81 mg by mouth every morning.     Marland Kitchen CARVEDILOL 6.25 MG PO TABS Oral Take 6.25 mg by mouth 2 (two) times daily.      . IRON 325 (65 FE) MG PO TABS Oral Take 1 tablet by mouth 2 (two) times daily.      . FUROSEMIDE 20 MG PO TABS Oral Take 40 mg by mouth every morning.     Marland Kitchen HYDRALAZINE HCL 50 MG PO TABS Oral Take 50 mg by mouth 3 (three) times daily.      . ISOSORBIDE MONONITRATE ER 60 MG PO TB24 Oral Take 60 mg by mouth every morning.     Marland Kitchen LISINOPRIL 20 MG PO TABS Oral Take 20 mg by mouth every morning.     Marland Kitchen PRASUGREL HCL 10 MG PO TABS Oral Take 10 mg by mouth every morning.     Marland Kitchen PRAVASTATIN SODIUM 40 MG PO TABS Oral Take 40 mg by mouth every evening.       BP 142/66  Pulse 93  Temp 97.8 F (36.6 C) (  Oral)  Resp 25  SpO2 100%  LMP 01/17/2012 Vitals reviewed Physical Exam Physical Examination: General appearance - alert, well appearing, and in no distress Mental status - alert, oriented to person, place, and time Mouth - mucous membranes moist, pharynx normal without lesions Neck - supple, no significant adenopathy Chest - clear to auscultation, no wheezes, rales or rhonchi, symmetric air entry Heart - normal rate, regular rhythm, normal S1, S2, no murmurs, rubs, clicks or gallops Abdomen - soft, nontender, nondistended, no masses or organomegaly Extremities - peripheral pulses normal, no pedal edema, no clubbing or cyanosis Skin - normal coloration and turgor, no rashes  ED Course  Procedures (including critical care time)    Date: 04/15/2012  Rate: 77  Rhythm: normal sinus rhythm  QRS Axis: normal  Intervals: normal  ST/T Wave abnormalities: normal  Conduction Disutrbances: none  Narrative Interpretation: unremarkable, unchanged compared to prior ekg of 11/18/11       Labs Reviewed    POCT I-STAT, CHEM 8 - Abnormal; Notable for the following:    Glucose, Bld 135 (*)     Hemoglobin 10.5 (*)     HCT 31.0 (*)     All other components within normal limits  POCT PREGNANCY, URINE  LAB REPORT - SCANNED   Dg Chest 2 View  04/15/2012  *RADIOLOGY REPORT*  Clinical Data: Near syncope.  CHEST - 2 VIEW  Comparison: Chest x-ray 02/03/2012.  Findings: Lung volumes are normal.  No consolidative airspace disease.  No pleural effusions.  No pneumothorax.  No pulmonary nodule or mass noted.  Pulmonary vasculature and the cardiomediastinal silhouette are within normal limits.  IMPRESSION: 1. No radiographic evidence of acute cardiopulmonary disease.  Original Report Authenticated By: Florencia Reasons, M.D.   US Breast Left  04/15/2012  *RADIOLOGY REPORT*  Clinical Data:  Palpable lump in the upper outer quadrant of the left breast.  Baseline examination.  DIGITAL DIAGNOSTIC BILATERAL MAMMOGRAM WITH CAD AND LEFT BREAST ULTRASOUND:  Comparison:  None.  Findings:  CC and MLO views of both breasts were obtained.  Almost entirely fatty breast tissue.  No new suspicious mass, nonsurgical architectural distortion, or suspicious calcifications in either breast.  Specifically, no mammographic abnormalities in the upper outer quadrant of the left breast. Mammographic images were processed with CAD.  On physical exam, I palpate no mass in the upper outer left breast.  Ultrasound is performed, showing normal fatty and fibroglandular breast tissue in the upper outer quadrant of the left breast.  No cyst, solid mass, or abnormal acoustic shadowing was identified.  IMPRESSION:  1.  No mammographic or sonographic evidence of malignancy, left breast. 2.  No mammographic evidence of malignancy, right breast.  RECOMMENDATION: Screening mammogram in one year. (Code:SM-B-01Y)  The patient was encouraged to perform monthly self breast examination and communicate any changes with her primary physician. Findings and  recommendations were discussed with the patient and provided in written form at the time of the exam.  BI-RADS CATEGORY 1:  Negative.  The patient experienced a probable vasovagal episode when the final left MLO image was obtained, while in compression.  The patient denied injury as a result of the fall.  Her vital signs were normal approximately 10 minutes later, with a blood pressure of 118/68 and a pulse rate of 68, and the patient stated that she was beginning to feel better at that time.  However, the patient never felt completely normal, so she elected to to be taken to the emergency department  by ambulance.  Original Report Authenticated By: Arnell Sieving, M.D.     1. Syncope       MDM  Pt presenting with c/o syncope- no chest pain/sob or palpitations.  Pt is asymptomatic on my evaluation in the ED.  EKG reassuring, not anemic, not orthostatic in terms of vital signs.  Has ambulated without difficulty in the ED.  Discharged with strict return precautions.  Pt is agreeable with this plan.  Given information for cardiology followup        Ethelda Chick, MD 04/16/12 2032

## 2012-04-16 NOTE — ED Provider Notes (Signed)
I saw and evaluated the patient, reviewed the resident's note and I agree with the findings and plan.  I saw this patient primarily- please refer to my note.  The resident saw this patient in duplicate by mistake.    Ethelda Chick, MD 04/16/12 [phone]

## 2012-11-29 ENCOUNTER — Ambulatory Visit: Payer: Medicaid Other

## 2013-02-21 ENCOUNTER — Ambulatory Visit (INDEPENDENT_AMBULATORY_CARE_PROVIDER_SITE_OTHER): Payer: Medicaid Other | Admitting: Pulmonary Disease

## 2013-02-21 ENCOUNTER — Encounter: Payer: Self-pay | Admitting: Pulmonary Disease

## 2013-02-21 VITALS — BP 112/70 | HR 82 | Temp 97.4°F | Ht 62.0 in | Wt 266.0 lb

## 2013-02-21 DIAGNOSIS — G4733 Obstructive sleep apnea (adult) (pediatric): Secondary | ICD-10-CM

## 2013-02-21 NOTE — Patient Instructions (Addendum)
Will schedule for a sleep study, and will arrange for followup once the results are available.  Work on weight loss.  

## 2013-02-21 NOTE — Assessment & Plan Note (Signed)
The patient's history is very suggestive of clinically significant obstructive sleep apnea.  I had a long discussion with her about the pathophysiology of sleep apnea, including its impact to her quality of life and cardiovascular health.  I think she needs to have a sleep study scheduled, and the patient is agreeable.  We'll arrange followup once the results are available.

## 2013-02-21 NOTE — Progress Notes (Signed)
Subjective:    Patient ID: [name], female    DOB: 09-08-1966, 47 y.o.   MRN: [id number]  HPI The patient is a 47 year old female who I've been asked to see for possible obstructive sleep apnea.  The patient had a sleep study in [place] over 10 years ago, and was told that she did not have sleep apnea at that time.  Currently however, the patient has been noted to have loud snoring, but has never been told of witnessed apneas.  The patient does admit to having occasional choking and coughing episodes during the night.  She reports that she is unrested at least 50% of the mornings, and has significant sleep pressure during the day with inactivity.  She will fall asleep easily in the evenings watching television, and has sleepiness with driving longer distances.  She states that her weight is up 40 pounds over the last 2 years, and her Epworth score today is very abnormal at 15.   Sleep Questionnaire What time do you typically go to bed?( Between what hours) 830-9p 830-9p at 1527 on 02/21/13 by Nita Sells, CMA How long does it take you to fall asleep?  at 1527 on 02/21/13 by Nita Sells, CMA How many times during the night do you wake up? 2 2 at 1527 on 02/21/13 by Nita Sells, CMA What time do you get out of bed to start your day? 0430 0430 at 1527 on 02/21/13 by Nita Sells, CMA Do you drive or operate heavy machinery in your occupation? No No at 1527 on 02/21/13 by Nita Sells, CMA How much has your weight changed (up or down) over the past two years? (In pounds) 40 lb (18.144 kg)40 lb (18.144 kg) increased at 1527 on 02/21/13 by Nita Sells, CMA Have you ever had a sleep study before? Yes Yes at 1527 on 02/21/13 by Nita Sells, CMA If yes, location of study? [place] at 1527 on 02/21/13 by Nita Sells, CMA If yes, date of study? 10+years ago 10+years ago at 1527 on 02/21/13 by Nita Sells, CMA Do you currently  use CPAP? No No at 1527 on 02/21/13 by Marjo Bicker Mabe, CMA Do you wear oxygen at any time? No No at 1527 on 02/21/13 by Marjo Bicker Mabe, CMA   Review of Systems  Constitutional: Negative for fever and unexpected weight change.  HENT: Negative for ear pain, nosebleeds, congestion, sore throat, rhinorrhea, sneezing, trouble swallowing, dental problem, postnasal drip and sinus pressure.   Eyes: Negative for redness and itching.  Respiratory: Negative for cough, chest tightness, shortness of breath and wheezing.   Cardiovascular: Negative for palpitations and leg swelling.  Gastrointestinal: Negative for nausea and vomiting.  Genitourinary: Negative for dysuria.  Musculoskeletal: Negative for joint swelling.  Skin: Negative for rash.  Neurological: Negative for headaches.  Hematological: Does not bruise/bleed easily.  Psychiatric/Behavioral: Negative for dysphoric mood. The patient is not nervous/anxious.        Objective:   Physical Exam Constitutional:  Morbidly obese female, no acute distress  HENT:  Nares patent without discharge  Oropharynx without exudate, palate and uvula are thick and elongated.  Eyes:  Perrla, eomi, no scleral icterus  Neck:  No JVD, no TMG  Cardiovascular:  Normal rate, regular rhythm, no rubs or gallops.  No murmurs        Intact distal pulses  Pulmonary :  Normal breath sounds, no stridor or respiratory distress  No rales, rhonchi, or wheezing  Abdominal:  Soft, nondistended, bowel sounds present.  No tenderness noted.   Musculoskeletal:  mild lower extremity edema noted.  Lymph Nodes:  No cervical lymphadenopathy noted  Skin:  No cyanosis noted  Neurologic:  Alert, appropriate, moves all 4 extremities without obvious deficit.         Assessment & Plan:

## 2013-03-17 ENCOUNTER — Ambulatory Visit (HOSPITAL_BASED_OUTPATIENT_CLINIC_OR_DEPARTMENT_OTHER): Payer: No Typology Code available for payment source | Attending: Pulmonary Disease | Admitting: Radiology

## 2013-03-17 VITALS — Ht 62.0 in | Wt 259.0 lb

## 2013-03-17 DIAGNOSIS — R0609 Other forms of dyspnea: Secondary | ICD-10-CM | POA: Insufficient documentation

## 2013-03-17 DIAGNOSIS — G4730 Sleep apnea, unspecified: Secondary | ICD-10-CM | POA: Insufficient documentation

## 2013-03-17 DIAGNOSIS — R0989 Other specified symptoms and signs involving the circulatory and respiratory systems: Secondary | ICD-10-CM | POA: Insufficient documentation

## 2013-03-17 DIAGNOSIS — G4733 Obstructive sleep apnea (adult) (pediatric): Secondary | ICD-10-CM

## 2013-03-17 DIAGNOSIS — G4710 Hypersomnia, unspecified: Secondary | ICD-10-CM | POA: Insufficient documentation

## 2013-03-21 ENCOUNTER — Encounter: Payer: No Typology Code available for payment source | Attending: Internal Medicine | Admitting: *Deleted

## 2013-03-21 DIAGNOSIS — Z713 Dietary counseling and surveillance: Secondary | ICD-10-CM | POA: Insufficient documentation

## 2013-03-21 DIAGNOSIS — E119 Type 2 diabetes mellitus without complications: Secondary | ICD-10-CM | POA: Insufficient documentation

## 2013-03-24 ENCOUNTER — Encounter: Payer: Self-pay | Admitting: *Deleted

## 2013-03-24 NOTE — Patient Instructions (Signed)
Goals:  Follow Diabetes Meal Plan as instructed  Eat 3 meals and 2 snacks, every 3-5 hrs  Limit carbohydrate intake to 45 grams carbohydrate/meal  Limit carbohydrate intake to 20 grams carbohydrate/snack  Add lean protein foods to meals/snacks  Monitor glucose levels as instructed by your doctor  Aim for 15-30 mins of physical activity daily  Bring food record and glucose log to your next nutrition visit

## 2013-03-24 NOTE — Progress Notes (Signed)
Patient was seen on 03/21/2013 for the first of a series of three diabetes self-management courses at the [facility]. Patient's most recent A1c was 9.2 % on 03/09/2013 The following learning objectives were met by the patient during this course:   Defines the role of glucose and insulin  Identifies type of diabetes and pathophysiology  Defines the diagnostic criteria for diabetes and prediabetes  States the risk factors for Type 2 Diabetes  States the symptoms of Type 2 Diabetes  Defines Type 2 Diabetes treatment goals  Defines Type 2 Diabetes treatment options  States the rationale for glucose monitoring  Identifies A1C, glucose targets, and testing times  Identifies proper sharps disposal  Defines the purpose of a diabetes food plan  Identifies carbohydrate food groups  Defines effects of carbohydrate foods on glucose levels  Identifies carbohydrate choices/grams/food labels  States benefits of physical activity and effect on glucose  Review of suggested activity guidelines  Handouts given during class include:  Type 2 Diabetes: Basics Book  My Food Plan Book  Food and Activity Log   Follow-Up Plan: Core Class 2

## 2013-03-27 DIAGNOSIS — G4730 Sleep apnea, unspecified: Secondary | ICD-10-CM

## 2013-03-27 DIAGNOSIS — G4710 Hypersomnia, unspecified: Secondary | ICD-10-CM

## 2013-03-28 ENCOUNTER — Telehealth: Payer: Self-pay | Admitting: Pulmonary Disease

## 2013-03-28 ENCOUNTER — Encounter: Payer: No Typology Code available for payment source | Admitting: *Deleted

## 2013-03-28 DIAGNOSIS — E119 Type 2 diabetes mellitus without complications: Secondary | ICD-10-CM

## 2013-03-28 NOTE — Procedures (Signed)
NAMEVERDIS, BASSETTE              ACCOUNT NO.:  [id number]  MEDICAL RECORD NO.:  [id number]          PATIENT TYPE:  OUT  LOCATION:  SLEEP CENTER                 FACILITY:  [facility]  PHYSICIAN:  Barbaraann Share, MD,FCCPDATE OF BIRTH:  1966/01/20  DATE OF STUDY:  03/17/2013                           NOCTURNAL POLYSOMNOGRAM  REFERRING PHYSICIAN:  Barbaraann Share, MD,FCCP  INDICATIONS FOR STUDY:  Hypersomnia with sleep apnea.  EPWORTH SLEEPINESS SCORE:  15.  MEDICATIONS:  SLEEP ARCHITECTURE:  The patient had total sleep time of 369 minutes with no slow-wave sleep and adequate quantity of REM.  Sleep onset latency was normal at 18 minutes and REM onset was normal at 80 minutes. Sleep efficiency was mildly reduced at 88%.  RESPIRATORY DATA:  The patient was found to have 1 apnea and 3 obstructive hypopneas, giving her an apnea-hypopnea index of 0.7 events per hour.  The events occurred in all body positions, and there was moderate snoring noted throughout.  OXYGEN DATA:  There was O2 desaturation as low as 73% with the patient's obstructive events.  CARDIAC DATA:  Rare PAC noted, but no clinically significant arrhythmias were seen.  MOVEMENTS-PARASOMNIA:  The patient had no significant leg jerks or other abnormal behaviors.  IMPRESSIONS/RECOMMENDATIONS: 1. Small numbers of obstructive events, which do not meet the AHI     criteria for the obstructive sleep apnea syndrome.  The patient     should be encouraged to work aggressively on weight loss, and could     also consider positional therapy, dental appliance, and possibly     upper airway surgery for treatment of symptomatic snoring. 2. Rare PAC noted, but no clinically significant arrhythmias were     seen.     Barbaraann Share, MD,FCCP Diplomate, American Board of Sleep Medicine   [facility]  D:  03/27/2013 08:24:21  T:  03/28/2013 00:20:18  Job:  [id number]

## 2013-03-28 NOTE — Telephone Encounter (Signed)
Called pt and discussed sleep study with her.   She does not have sleep apnea, but may have UARS.  I have recommended working on weight loss, but she consider a dental appliance while working on weight.  She is not a candidate for cpap.  The pt will think about this and let me know if she wishes to consider an appliance.

## 2013-03-29 NOTE — Progress Notes (Signed)
  Patient was seen on 03/28/2013 for the second of a series of three diabetes self-management courses at the [facility]. The following learning objectives were met by the patient during this course:   Explain basic nutrition maintenance and quality assurance  Describe causes, symptoms and treatment of hypoglycemia and hyperglycemia  Explain how to manage diabetes during illness  Describe the importance of good nutrition for health and healthy eating strategies  List strategies to follow meal plan when dining out  Describe the effects of alcohol on glucose and how to use it safely  Describe problem solving skills for day-to-day glucose challenges  Describe strategies to use when treatment plan needs to change  Identify important factors involved in successful weight loss  Describe ways to remain physically active  Describe the impact of regular activity on insulin resistance    Handouts given in class:  Refrigerator magnet for Sick Day Guidelines  [facility] Oral medication/insulin handout  Follow-Up Plan: Patient will attend the final class of the ADA Diabetes Self-Care Education.    

## 2013-04-11 ENCOUNTER — Encounter: Payer: No Typology Code available for payment source | Admitting: *Deleted

## 2013-04-11 ENCOUNTER — Ambulatory Visit: Payer: Medicaid Other

## 2013-04-11 DIAGNOSIS — E119 Type 2 diabetes mellitus without complications: Secondary | ICD-10-CM

## 2013-04-12 NOTE — Progress Notes (Signed)
  Patient was seen on 04/11/13 for the third of a series of three diabetes self-management courses at the [facility]. The following learning objectives were met by the patient during this course:    Describe how diabetes changes over time   Identify diabetes complications and ways to prevent them   Describe strategies that can promote heart health including lowering blood pressure and cholesterol   Describe strategies to lower dietary fat and sodium in the diet   Identify physical activities that benefit cardiovascular health   Evaluate success in meeting personal goal   Describe the belief that they can live successfully with diabetes day to day   Establish 2-3 goals that they will plan to diligently work on until they return for the free 40-month follow-up visit  The following handouts were given in class:  3 Month Follow Up Visit handout  Goal setting handout  Class evaluation form  Your patient has established the following 3 month goals for diabetes self-care:  Be active  Manage stress  Follow-Up Plan: Patient will attend a 3 month follow-up visit for diabetes self-management education.

## 2013-04-14 ENCOUNTER — Emergency Department (HOSPITAL_COMMUNITY): Payer: Medicaid Other

## 2013-04-14 ENCOUNTER — Emergency Department (HOSPITAL_COMMUNITY)
Admission: EM | Admit: 2013-04-14 | Discharge: 2013-04-15 | Disposition: A | Discharge status: Home / Self Care | Payer: Medicaid Other | Attending: Emergency Medicine | Admitting: Emergency Medicine

## 2013-04-14 ENCOUNTER — Encounter (HOSPITAL_COMMUNITY): Payer: Self-pay | Admitting: Emergency Medicine

## 2013-04-14 DIAGNOSIS — I509 Heart failure, unspecified: Secondary | ICD-10-CM | POA: Insufficient documentation

## 2013-04-14 DIAGNOSIS — S7000XA Contusion of unspecified hip, initial encounter: Secondary | ICD-10-CM | POA: Insufficient documentation

## 2013-04-14 DIAGNOSIS — W108XXA Fall (on) (from) other stairs and steps, initial encounter: Secondary | ICD-10-CM | POA: Insufficient documentation

## 2013-04-14 DIAGNOSIS — Y9389 Activity, other specified: Secondary | ICD-10-CM | POA: Insufficient documentation

## 2013-04-14 DIAGNOSIS — Z87891 Personal history of nicotine dependence: Secondary | ICD-10-CM | POA: Insufficient documentation

## 2013-04-14 DIAGNOSIS — E669 Obesity, unspecified: Secondary | ICD-10-CM | POA: Insufficient documentation

## 2013-04-14 DIAGNOSIS — Z98870 Personal history of in utero procedure during pregnancy: Secondary | ICD-10-CM | POA: Insufficient documentation

## 2013-04-14 DIAGNOSIS — W19XXXA Unspecified fall, initial encounter: Secondary | ICD-10-CM

## 2013-04-14 DIAGNOSIS — I10 Essential (primary) hypertension: Secondary | ICD-10-CM | POA: Insufficient documentation

## 2013-04-14 DIAGNOSIS — Y92009 Unspecified place in unspecified non-institutional (private) residence as the place of occurrence of the external cause: Secondary | ICD-10-CM | POA: Insufficient documentation

## 2013-04-14 DIAGNOSIS — E119 Type 2 diabetes mellitus without complications: Secondary | ICD-10-CM | POA: Insufficient documentation

## 2013-04-14 DIAGNOSIS — D649 Anemia, unspecified: Secondary | ICD-10-CM | POA: Insufficient documentation

## 2013-04-14 DIAGNOSIS — Z8709 Personal history of other diseases of the respiratory system: Secondary | ICD-10-CM | POA: Insufficient documentation

## 2013-04-14 DIAGNOSIS — Z79899 Other long term (current) drug therapy: Secondary | ICD-10-CM | POA: Insufficient documentation

## 2013-04-14 DIAGNOSIS — T148XXA Other injury of unspecified body region, initial encounter: Secondary | ICD-10-CM

## 2013-04-14 DIAGNOSIS — Z7982 Long term (current) use of aspirin: Secondary | ICD-10-CM | POA: Insufficient documentation

## 2013-04-14 DIAGNOSIS — Z8742 Personal history of other diseases of the female genital tract: Secondary | ICD-10-CM | POA: Insufficient documentation

## 2013-04-14 HISTORY — DX: Obesity, unspecified: E66.9

## 2013-04-14 MED ORDER — OXYCODONE-ACETAMINOPHEN 5-325 MG PO TABS: 1.0000 | ORAL_TABLET | Freq: Four times a day (QID) | ORAL | Status: DC | PRN

## 2013-04-14 MED ORDER — HYDROCODONE-ACETAMINOPHEN 5-325 MG PO TABS
1.0000 | ORAL_TABLET | Freq: Once | ORAL | Status: AC
  Administered 2013-04-14: 1 via ORAL
  Filled 2013-04-14: qty 1

## 2013-04-14 NOTE — ED Notes (Signed)
Pt comfortable with d/c and f/u instructions. Prescriptions x1 

## 2013-04-14 NOTE — ED Notes (Signed)
Dr. Tanna Savoy at bedside for assessment.

## 2013-04-14 NOTE — ED Provider Notes (Signed)
History    CSN: [id number] Arrival date & time 04/14/13  2040  First MD Initiated Contact with Patient 04/14/13 2104     Chief Complaint  Patient presents with  . Fall   (Consider location/radiation/quality/duration/timing/severity/associated sxs/prior Treatment) Patient is a 47 y.o. female presenting with fall. The history is provided by the patient.  Fall This is a new problem. The current episode started 6 to 12 hours ago. The problem occurs constantly. The problem has been gradually worsening. Pertinent negatives include no chest pain, no abdominal pain, no headaches and no shortness of breath. Associated symptoms comments: Larey Seat down about 6 steps at 8 this am and initially felt ok and then started to develop pain throughout the day.  Now pain in the right forearm, hip and ankle.  Able to walk but painful.. The symptoms are aggravated by walking. Nothing relieves the symptoms. She has tried nothing for the symptoms. The treatment provided no relief.   Past Medical History  Diagnosis Date  . HTN (hypertension)   . Fatigue   . CHF (congestive heart failure) 04/2011    EF 15-20% from echo 05/19/11  . DOE (dyspnea on exertion)   . Orthopnea   . DVT (deep venous thrombosis) 06/2011  . Anemia   . Menorrhagia     with iron deficient anemia  . Diabetes mellitus without complication   . Obesity    Past Surgical History  Procedure Laterality Date  . Tubal ligation  2006  . Cyst removed from ovary    . Coronary stent placement     Family History  Problem Relation Age of Onset  . Hypertension Mother   . Stroke Father    History  Substance Use Topics  . Smoking status: Former Smoker -- 0.50 packs/day for 5 years    Types: Cigarettes    Quit date: 10/19/1993  . Smokeless tobacco: Not on file  . Alcohol Use: No   OB History   Grav Para Term Preterm Abortions TAB SAB Ect Mult Living                 Review of Systems  Respiratory: Negative for shortness of breath.     Cardiovascular: Negative for chest pain.  Gastrointestinal: Negative for abdominal pain.  Neurological: Negative for headaches.  All other systems reviewed and are negative.    Allergies  Detrol  Home Medications   Current Outpatient Rx  Name  Route  Sig  Dispense  Refill  . albuterol (VENTOLIN HFA) 108 (90 BASE) MCG/ACT inhaler   Inhalation   Inhale 2 puffs into the lungs every 6 (six) hours as needed. For shortness of breath.         Marland Kitchen amLODipine (NORVASC) 5 MG tablet   Oral   Take 5 mg by mouth every evening.          Marland Kitchen aspirin 81 MG tablet   Oral   Take 81 mg by mouth every evening.          Marland Kitchen atorvastatin (LIPITOR) 40 MG tablet   Oral   Take 40 mg by mouth every evening.          . carvedilol (COREG) 6.25 MG tablet   Oral   Take 6.25 mg by mouth 2 (two) times daily.           . Cholecalciferol (VITAMIN D-3) 1000 UNITS CAPS   Oral   Take by mouth daily.         . Ferrous  Sulfate (IRON) 325 (65 FE) MG TABS   Oral   Take 1 tablet by mouth 2 (two) times daily.           . furosemide (LASIX) 20 MG tablet   Oral   Take 40 mg by mouth every morning.          Marland Kitchen glipiZIDE (GLUCOTROL XL) 5 MG 24 hr tablet   Oral   Take 5 mg by mouth daily.         . hydrALAZINE (APRESOLINE) 50 MG tablet   Oral   Take 50 mg by mouth 3 (three) times daily.           Marland Kitchen losartan (COZAAR) 25 MG tablet   Oral   Take 25 mg by mouth daily.         . metFORMIN (GLUCOPHAGE) 850 MG tablet   Oral   Take 850 mg by mouth 2 (two) times daily with a meal.         . pravastatin (PRAVACHOL) 40 MG tablet   Oral   Take 40 mg by mouth every evening.           BP 125/72  Pulse 72  Temp(Src) 98.2 F (36.8 C) (Oral)  Resp 16  SpO2 98%  LMP 01/17/2012 Physical Exam  Nursing note and vitals reviewed. Constitutional: She is oriented to person, place, and time. She appears well-developed and well-nourished. No distress.  HENT:  Head: Normocephalic and  atraumatic.  Mouth/Throat: Oropharynx is clear and moist.  Eyes: Conjunctivae and EOM are normal. Pupils are equal, round, and reactive to light.  Neck: Normal range of motion. Neck supple. No spinous process tenderness and no muscular tenderness present.  Cardiovascular: Normal rate, regular rhythm and intact distal pulses.   No murmur heard. Pulmonary/Chest: Effort normal and breath sounds normal. No respiratory distress. She has no wheezes. She has no rales. She exhibits no tenderness.  Abdominal: Soft. She exhibits no distension. There is no tenderness. There is no rebound and no guarding.  Musculoskeletal: Normal range of motion. She exhibits tenderness. She exhibits no edema.       Right hip: She exhibits tenderness and bony tenderness. She exhibits no deformity and no laceration.       Right knee: Normal.       Right ankle: She exhibits swelling. She exhibits normal range of motion and no deformity. Tenderness. Lateral malleolus tenderness found. No medial malleolus and no proximal fibula tenderness found.       Legs:      Feet:  Neurological: She is alert and oriented to person, place, and time.  Skin: Skin is warm and dry. No rash noted. No erythema.  Psychiatric: She has a normal mood and affect. Her behavior is normal.    ED Course  Procedures (including critical care time) Labs Reviewed - No data to display Dg Forearm Right  04/14/2013   *RADIOLOGY REPORT*  Clinical Data: Fall with right hip pain and forearm pain.  RIGHT FOREARM - 2 VIEW  Comparison: None.  Findings: Degenerative regularity of the elbow. No acute fracture or dislocation.  No elbow joint effusion.  IMPRESSION: No acute osseous abnormality.   Original Report Authenticated By: Jeronimo Greaves, M.D.   Dg Hip Complete Right  04/14/2013   *RADIOLOGY REPORT*  Clinical Data: Trauma and pain.  RIGHT HIP - COMPLETE 2+ VIEW  Comparison: 12/16/2010  Findings: Femoral heads are located.  Prior tubal ligation. Sacroiliac joints  are symmetric.  No acute fracture.  IMPRESSION: Normal pelvis/right hip.   Original Report Authenticated By: Jeronimo Greaves, M.D.   Dg Ankle Complete Right  04/14/2013   *RADIOLOGY REPORT*  Clinical Data: Fall with ankle and foot pain.  RIGHT ANKLE - COMPLETE 3+ VIEW  Comparison: 04/13/2012 foot films  Findings: Diffuse soft tissue swelling.  Mild degenerative changes of the tibiotalar joint. Base of fifth metatarsal and talar dome intact.  Achilles and calcaneal spur.  Midfoot osteoarthritis.  IMPRESSION: Soft tissue swelling and degenerative changes. No acute osseous abnormality.   Original Report Authenticated By: Jeronimo Greaves, M.D.   1. Fall at home, initial encounter   2. Contusion     MDM   Patient with a mechanical fall this morning down the steps who initially felt okay and as the days progressed had worsening pain in her right forearm and hip and ankle area. She has no chest pain or abdominal pain on exam. She denies any head injury or neck pain. She is neurovascularly intact. Pain with range of motion of the hip, ankle and with palpation of the forearm. Normal wrist and elbow on the right. We'll get plain films to further evaluate. Patient given pain control.  11:48 PM Films neg and pt d/ced home.  Gwyneth Sprout, MD 04/14/13 2348

## 2013-04-14 NOTE — ED Notes (Signed)
PT. LOST HER BALANCED WHILE GOING DOWN STAIRS AND FELL THIS MORNING , NO LOC / AMBULATORY , REPORTS RIGHT SIDE BODY ACHES AND RIGHT 5TH TOE PAIN . ALERT AND ORIENTED / RESPIRATIONS UNLABORED.

## 2013-05-20 ENCOUNTER — Emergency Department (HOSPITAL_COMMUNITY): Payer: No Typology Code available for payment source

## 2013-05-20 ENCOUNTER — Emergency Department (HOSPITAL_COMMUNITY)
Admission: EM | Admit: 2013-05-20 | Discharge: 2013-05-20 | Disposition: A | Discharge status: Home / Self Care | Payer: No Typology Code available for payment source | Attending: Emergency Medicine | Admitting: Emergency Medicine

## 2013-05-20 ENCOUNTER — Encounter (HOSPITAL_COMMUNITY): Payer: Self-pay | Admitting: *Deleted

## 2013-05-20 DIAGNOSIS — I509 Heart failure, unspecified: Secondary | ICD-10-CM | POA: Insufficient documentation

## 2013-05-20 DIAGNOSIS — R279 Unspecified lack of coordination: Secondary | ICD-10-CM | POA: Insufficient documentation

## 2013-05-20 DIAGNOSIS — R209 Unspecified disturbances of skin sensation: Secondary | ICD-10-CM | POA: Insufficient documentation

## 2013-05-20 DIAGNOSIS — Z79899 Other long term (current) drug therapy: Secondary | ICD-10-CM | POA: Insufficient documentation

## 2013-05-20 DIAGNOSIS — I10 Essential (primary) hypertension: Secondary | ICD-10-CM | POA: Insufficient documentation

## 2013-05-20 DIAGNOSIS — Z862 Personal history of diseases of the blood and blood-forming organs and certain disorders involving the immune mechanism: Secondary | ICD-10-CM | POA: Insufficient documentation

## 2013-05-20 DIAGNOSIS — Z8709 Personal history of other diseases of the respiratory system: Secondary | ICD-10-CM | POA: Insufficient documentation

## 2013-05-20 DIAGNOSIS — E669 Obesity, unspecified: Secondary | ICD-10-CM | POA: Insufficient documentation

## 2013-05-20 DIAGNOSIS — R5383 Other fatigue: Secondary | ICD-10-CM | POA: Insufficient documentation

## 2013-05-20 DIAGNOSIS — Z7982 Long term (current) use of aspirin: Secondary | ICD-10-CM | POA: Insufficient documentation

## 2013-05-20 DIAGNOSIS — Z86718 Personal history of other venous thrombosis and embolism: Secondary | ICD-10-CM | POA: Insufficient documentation

## 2013-05-20 DIAGNOSIS — R5381 Other malaise: Secondary | ICD-10-CM | POA: Insufficient documentation

## 2013-05-20 DIAGNOSIS — Z8742 Personal history of other diseases of the female genital tract: Secondary | ICD-10-CM | POA: Insufficient documentation

## 2013-05-20 DIAGNOSIS — E119 Type 2 diabetes mellitus without complications: Secondary | ICD-10-CM | POA: Insufficient documentation

## 2013-05-20 DIAGNOSIS — R531 Weakness: Secondary | ICD-10-CM

## 2013-05-20 DIAGNOSIS — Z87891 Personal history of nicotine dependence: Secondary | ICD-10-CM | POA: Insufficient documentation

## 2013-05-20 LAB — URINALYSIS, ROUTINE W REFLEX MICROSCOPIC
Bilirubin Urine: NEGATIVE
Glucose, UA: NEGATIVE mg/dL
Hgb urine dipstick: NEGATIVE
Nitrite: NEGATIVE
Specific Gravity, Urine: 1.015 (ref 1.005–1.030)
pH: 6 (ref 5.0–8.0)

## 2013-05-20 LAB — COMPREHENSIVE METABOLIC PANEL
CO2: 29 mEq/L (ref 19–32)
Calcium: 9.7 mg/dL (ref 8.4–10.5)
Creatinine, Ser: 1.05 mg/dL (ref 0.50–1.10)
GFR calc Af Amer: 73 mL/min — ABNORMAL LOW (ref >90)
GFR calc non Af Amer: 63 mL/min — ABNORMAL LOW (ref >90)
Glucose, Bld: 98 mg/dL (ref 70–99)

## 2013-05-20 LAB — POCT I-STAT, CHEM 8
Chloride: 103 mEq/L (ref 96–112)
Creatinine, Ser: 1.2 mg/dL — ABNORMAL HIGH (ref 0.50–1.10)
Glucose, Bld: 97 mg/dL (ref 70–99)
HCT: 31 % — ABNORMAL LOW (ref 36.0–46.0)
Potassium: 3.4 mEq/L — ABNORMAL LOW (ref 3.5–5.1)

## 2013-05-20 LAB — DIFFERENTIAL
Eosinophils Absolute: 0.1 10*3/uL (ref 0.0–0.7)
Eosinophils Relative: 2 % (ref 0–5)
Lymphocytes Relative: 21 % (ref 12–46)
Lymphs Abs: 1.4 10*3/uL (ref 0.7–4.0)
Monocytes Absolute: 0.5 10*3/uL (ref 0.1–1.0)

## 2013-05-20 LAB — GLUCOSE, CAPILLARY

## 2013-05-20 LAB — RAPID URINE DRUG SCREEN, HOSP PERFORMED
Cocaine: NOT DETECTED
Opiates: NOT DETECTED

## 2013-05-20 LAB — CBC
HCT: 31 % — ABNORMAL LOW (ref 36.0–46.0)
MCH: 24.4 pg — ABNORMAL LOW (ref 26.0–34.0)
MCV: 71.9 fL — ABNORMAL LOW (ref 78.0–100.0)
RBC: 4.31 MIL/uL (ref 3.87–5.11)
RDW: 13.8 % (ref 11.5–15.5)
WBC: 6.7 10*3/uL (ref 4.0–10.5)

## 2013-05-20 LAB — ETHANOL: Alcohol, Ethyl (B): <11 mg/dL (ref 0–11)

## 2013-05-20 LAB — TROPONIN I: Troponin I: <0.3 ng/mL (ref <0.30)

## 2013-05-20 NOTE — ED Notes (Signed)
Pt reports dizziness and unsteady gait that started yesterday, has numbness sensation to left side of body, grip weaker on left side, no facial droop noted at triage.

## 2013-05-20 NOTE — ED Notes (Signed)
Neurology at bedside.

## 2013-05-20 NOTE — ED Notes (Signed)
Patient transported to MRI 

## 2013-05-20 NOTE — ED Provider Notes (Addendum)
CSN: [id number]     Arrival date & time 05/20/13  1601 History     First MD Initiated Contact with Patient 05/20/13 1615     Chief Complaint  Patient presents with  . Dizziness   (Consider location/radiation/quality/duration/timing/severity/associated sxs/prior Treatment) HPI Patient presents with one day of symptoms.  She states that since yesterday she has had dysesthesia on the left side of her body, with grip weakness.  No report of facial drooping, nor dysphasia.  No report of confusion, disorientation, other notable changes. No pain. Onset was insidious. No clear precipitant, alleviating, exacerbating factors.  She has MMP, including HTN, CHF.   Past Medical History  Diagnosis Date  . HTN (hypertension)   . Fatigue   . CHF (congestive heart failure) 04/2011    EF 15-20% from echo 05/19/11  . DOE (dyspnea on exertion)   . Orthopnea   . DVT (deep venous thrombosis) 06/2011  . Anemia   . Menorrhagia     with iron deficient anemia  . Diabetes mellitus without complication   . Obesity    Past Surgical History  Procedure Laterality Date  . Tubal ligation  2006  . Cyst removed from ovary    . Coronary stent placement     Family History  Problem Relation Age of Onset  . Hypertension Mother   . Stroke Father    History  Substance Use Topics  . Smoking status: Former Smoker -- 0.50 packs/day for 5 years    Types: Cigarettes    Quit date: 10/19/1993  . Smokeless tobacco: Not on file  . Alcohol Use: No   OB History   Grav Para Term Preterm Abortions TAB SAB Ect Mult Living                 Review of Systems  Constitutional:       Per HPI, otherwise negative  HENT:       Per HPI, otherwise negative  Respiratory:       Per HPI, otherwise negative  Cardiovascular:       Per HPI, otherwise negative  Gastrointestinal: Negative for nausea and vomiting.  Endocrine:       Negative aside from HPI  Genitourinary:       Neg aside from HPI   Musculoskeletal:   Per HPI, otherwise negative  Skin: Negative.   Neurological: Positive for weakness and numbness. Negative for dizziness, tremors, seizures, syncope, facial asymmetry, speech difficulty, light-headedness and headaches.    Allergies  Detrol  Home Medications   Current Outpatient Rx  Name  Route  Sig  Dispense  Refill  . albuterol (VENTOLIN HFA) 108 (90 BASE) MCG/ACT inhaler   Inhalation   Inhale 2 puffs into the lungs every 6 (six) hours as needed. For shortness of breath.         Marland Kitchen amLODipine (NORVASC) 5 MG tablet   Oral   Take 5 mg by mouth every evening.          Marland Kitchen aspirin 81 MG tablet   Oral   Take 81 mg by mouth every evening.          Marland Kitchen atorvastatin (LIPITOR) 40 MG tablet   Oral   Take 40 mg by mouth every evening.          . carvedilol (COREG) 6.25 MG tablet   Oral   Take 6.25 mg by mouth 2 (two) times daily.           . Cholecalciferol (VITAMIN D-3)  1000 UNITS CAPS   Oral   Take by mouth daily.         . Ferrous Sulfate (IRON) 325 (65 FE) MG TABS   Oral   Take 1 tablet by mouth 2 (two) times daily.           . furosemide (LASIX) 20 MG tablet   Oral   Take 40 mg by mouth every morning.          Marland Kitchen glipiZIDE (GLUCOTROL XL) 5 MG 24 hr tablet   Oral   Take 5 mg by mouth daily.         . hydrALAZINE (APRESOLINE) 50 MG tablet   Oral   Take 50 mg by mouth 3 (three) times daily.           Marland Kitchen losartan (COZAAR) 25 MG tablet   Oral   Take 25 mg by mouth daily.         . metFORMIN (GLUCOPHAGE) 850 MG tablet   Oral   Take 850 mg by mouth 2 (two) times daily with a meal.         . oxyCODONE-acetaminophen (PERCOCET/ROXICET) 5-325 MG per tablet   Oral   Take 1-2 tablets by mouth every 6 (six) hours as needed for pain.   15 tablet   0   . pravastatin (PRAVACHOL) 40 MG tablet   Oral   Take 40 mg by mouth every evening.           BP 134/81  Pulse 71  Temp(Src) 97.9 F (36.6 C) (Oral)  Resp 20  SpO2 99%  LMP 01/17/2012 Physical  Exam  Nursing note and vitals reviewed. Constitutional: She is oriented to person, place, and time. She appears well-developed and well-nourished. No distress.  HENT:  Head: Normocephalic and atraumatic.  Eyes: Conjunctivae and EOM are normal.  Cardiovascular: Normal rate and regular rhythm.   Pulmonary/Chest: Effort normal and breath sounds normal. No stridor. No respiratory distress.  Abdominal: She exhibits no distension.  Musculoskeletal: She exhibits no edema.  Neurological: She is alert and oriented to person, place, and time. She displays no atrophy and no tremor. No cranial nerve deficit or sensory deficit. She exhibits abnormal muscle tone. She displays a negative Romberg sign. She displays no seizure activity. Coordination abnormal.  L ue and le strength is 4/5 throughout. L sided slowing of finger/nose. No pronator drift. No aphasia, no disorientation,  Skin: Skin is warm and dry.  Psychiatric: She has a normal mood and affect.    ED Course   Procedures (including critical care time)  Labs Reviewed  ETHANOL  PROTIME-INR  APTT  CBC  DIFFERENTIAL  COMPREHENSIVE METABOLIC PANEL  TROPONIN I  URINE RAPID DRUG SCREEN (HOSP PERFORMED)  URINALYSIS, ROUTINE W REFLEX MICROSCOPIC   No results found. No diagnosis found.  EKG has a sinus rate 70, normal Pulse oximetry 100% room air normal Cardiac monitor has rate 70s, regular, normal  8:50 PM I have discussed the patient's case with neurology (who has seen and evaluated the patient).  The consensus is that the patient is appropriate for d/c w close outpatient f/u. MDM  Patient presents with left-sided weakness.  The patient's complaints seem largely subjective, there is objective deficiency in the right lower extremity.  Patient has minimal risk profile for CVA.  On exam she is awake and alert, hemodynamically stable.  With reassuring initial evaluation, with persistency of her complaints, MRI was performed.  Subsequently I  discussed the neurologist.  Neurology  suggests that the patient is appropriate for close outpatient followup for further evaluation and consideration of other entities such as MS.    Gerhard Munch, MD 05/20/13 8119  Gerhard Munch, MD 06/08/13 [phone]

## 2013-05-20 NOTE — ED Notes (Signed)
Pt comfortable with d/c and f/u instructions. No prescriptions 

## 2013-05-20 NOTE — Consult Note (Addendum)
NEURO HOSPITALIST CONSULT NOTE    Reason for Consult: dizziness, imbalance, left sided paresthesias and weakness.  HPI:                                                                                                                                          [name] is an 47 y.o. female with a past medical history significant for hypertension, DM, hypercholesterolemia, CHF, DVT, who was in her usual state of health until yesterday afternoon when she was driving and developed acute onset of numbness and heaviness of the left side of her face, left arm, and left leg. Never had similar symptoms before and thought that she was having a stroke. She reports a history of frequent dizziness/lightheadeness, poor balance and couple of falls, but denies true vertigo, headache, double vision, confusion, slurred speech, language or vision impairment. No bladder or bowel impairment or pain. No history of painful visual loss. Feels fatigue and admits to be under a lot of stress. No recent fever, infection, rash, vaccinations, head or neck trauma. Se tells me that the left side still feels numb and she has to work harder to move the left side. CT brain unremarkable. MRI-DWI showed no evidence of acute stroke or mass but few non specific scattered bilateral subcortical hypeintensities.   Past Medical History  Diagnosis Date  . HTN (hypertension)   . Fatigue   . CHF (congestive heart failure) 04/2011    EF 15-20% from echo 05/19/11  . DOE (dyspnea on exertion)   . Orthopnea   . DVT (deep venous thrombosis) 06/2011  . Anemia   . Menorrhagia     with iron deficient anemia  . Diabetes mellitus without complication   . Obesity     Past Surgical History  Procedure Laterality Date  . Tubal ligation  2006  . Cyst removed from ovary    . Coronary stent placement      Family History  Problem Relation Age of Onset  . Hypertension Mother   . Stroke Father     Family History:  no demyelinating disease.    Social History:  reports that she quit smoking about 19 years ago. Her smoking use included Cigarettes. She has a 2.5 pack-year smoking history. She does not have any smokeless tobacco history on file. She reports that she does not drink alcohol or use illicit drugs.  Allergies  Allergen Reactions  . Detrol (Tolterodine Tartrate)     rash    MEDICATIONS:  I have reviewed the patient's current medications.   ROS:                                                                                                                                       History obtained from the patient and chart review.  General ROS: negative for - chills, fever, night sweats, weight gain or weight loss Psychological ROS: negative for - behavioral disorder, hallucinations, memory difficulties, mood swings or suicidal ideation Ophthalmic ROS: negative for - blurry vision, double vision, eye pain or loss of vision ENT ROS: negative for - epistaxis, nasal discharge, oral lesions, sore throat, tinnitus or vertigo Allergy and Immunology ROS: negative for - hives or itchy/watery eyes Hematological and Lymphatic ROS: negative for - bleeding problems, bruising or swollen lymph nodes Endocrine ROS: negative for - galactorrhea, hair pattern changes, polydipsia/polyuria or temperature intolerance Respiratory ROS: negative for - cough, hemoptysis, shortness of breath or wheezing Cardiovascular ROS: negative for - chest pain, dyspnea on exertion, edema or irregular heartbeat Gastrointestinal ROS: negative for - abdominal pain, diarrhea, hematemesis, nausea/vomiting or stool incontinence Genito-Urinary ROS: negative for - dysuria, hematuria, incontinence or urinary frequency/urgency Musculoskeletal ROS: negative for - joint swelling Neurological ROS: as noted in HPI Dermatological  ROS: negative for rash and skin lesion changes   Physical exam: pleasant female in no apparent distress.Blood pressure 128/62, pulse 70, temperature 98.6 F (37 C), temperature source Oral, resp. rate 20, last menstrual period 01/17/2012, SpO2 100.00%.  Head: normocephalic. Neck: supple, no bruits, no JVD. Cardiac: no murmurs. Lungs: clear. Abdomen: soft, no tender, no mass. Extremities: no edema.    Neurologic Examination:                                                                                                      Mental Status: Alert, awake, oriented x 4, thought content appropriate.Comprehension, naming, and repetition intact. Speech fluent without evidence of aphasia.  Cranial Nerves: II: Discs flat bilaterally; Visual fields grossly normal, pupils equal, round, reactive to light and accommodation III,IV, VI: ptosis not present, extra-ocular motions intact bilaterally V,VII: smile symmetric, facial light touch and pinprick slightly diminished left face. VIII: hearing normal bilaterally IX,X: gag reflex present XI: bilateral shoulder shrug XII: midline tongue extension Motor: 5/5 in the right hemibody. Very subtle weakness mainly left grip. Tone and bulk:normal tone throughout; no atrophy noted Sensory: Pinprick diminished in the left side. Deep Tendon Reflexes:  1+ all over  Plantars: Right: downgoing   Left:  downgoing Cerebellar: normal finger-to-nose,  normal heel-to-shin test Gait:  No ataxia. CV: pulses palpable throughout    Lab Results  Component Value Date/Time   CHOL 168 05/20/2011  2:04 AM    Results for orders placed during the hospital encounter of 05/20/13 (from the past 48 hour(s))  ETHANOL     Status: None   Collection Time    05/20/13  4:25 PM      Result Value Range   Alcohol, Ethyl (B) <11  0 - 11 mg/dL   Comment:            LOWEST DETECTABLE LIMIT FOR     SERUM ALCOHOL IS 11 mg/dL     FOR MEDICAL PURPOSES ONLY  PROTIME-INR      Status: None   Collection Time    05/20/13  4:25 PM      Result Value Range   Prothrombin Time 12.8  11.6 - 15.2 seconds   INR 0.98  0.00 - 1.49  APTT     Status: None   Collection Time    05/20/13  4:25 PM      Result Value Range   aPTT 32  24 - 37 seconds  CBC     Status: Abnormal   Collection Time    05/20/13  4:25 PM      Result Value Range   WBC 6.7  4.0 - 10.5 K/uL   RBC 4.31  3.87 - 5.11 MIL/uL   Hemoglobin 10.5 (*) 12.0 - 15.0 g/dL   HCT 40.9 (*) 81.1 - 91.4 %   MCV 71.9 (*) 78.0 - 100.0 fL   MCH 24.4 (*) 26.0 - 34.0 pg   MCHC 33.9  30.0 - 36.0 g/dL   RDW 78.2  95.6 - 21.3 %   Platelets 308  150 - 400 K/uL  DIFFERENTIAL     Status: None   Collection Time    05/20/13  4:25 PM      Result Value Range   Neutrophils Relative % 70  43 - 77 %   Neutro Abs 4.7  1.7 - 7.7 K/uL   Lymphocytes Relative 21  12 - 46 %   Lymphs Abs 1.4  0.7 - 4.0 K/uL   Monocytes Relative 7  3 - 12 %   Monocytes Absolute 0.5  0.1 - 1.0 K/uL   Eosinophils Relative 2  0 - 5 %   Eosinophils Absolute 0.1  0.0 - 0.7 K/uL   Basophils Relative 0  0 - 1 %   Basophils Absolute 0.0  0.0 - 0.1 K/uL  COMPREHENSIVE METABOLIC PANEL     Status: Abnormal   Collection Time    05/20/13  4:25 PM      Result Value Range   Sodium 143  135 - 145 mEq/L   Potassium 3.4 (*) 3.5 - 5.1 mEq/L   Chloride 102  96 - 112 mEq/L   CO2 29  19 - 32 mEq/L   Glucose, Bld 98  70 - 99 mg/dL   BUN 16  6 - 23 mg/dL   Creatinine, Ser 0.86  0.50 - 1.10 mg/dL   Calcium 9.7  8.4 - 57.8 mg/dL   Total Protein 7.5  6.0 - 8.3 g/dL   Albumin 3.3 (*) 3.5 - 5.2 g/dL   AST 13  0 - 37 U/L   ALT 14  0 - 35 U/L   Alkaline Phosphatase 112  39 - 117 U/L   Total Bilirubin 0.1 (*) 0.3 -  1.2 mg/dL   GFR calc non Af Amer 63 (*) >90 mL/min   GFR calc Af Amer 73 (*) >90 mL/min   Comment:            The eGFR has been calculated     using the CKD EPI equation.     This calculation has not been     validated in all clinical     situations.      eGFR's persistently     <90 mL/min signify     possible Chronic Kidney Disease.  TROPONIN I     Status: None   Collection Time    05/20/13  4:25 PM      Result Value Range   Troponin I <0.30  <0.30 ng/mL   Comment:            Due to the release kinetics of cTnI,     a negative result within the first hours     of the onset of symptoms does not rule out     myocardial infarction with certainty.     If myocardial infarction is still suspected,     repeat the test at appropriate intervals.  POCT I-STAT TROPONIN I     Status: None   Collection Time    05/20/13  4:52 PM      Result Value Range   Troponin i, poc 0.00  0.00 - 0.08 ng/mL   Comment 3            Comment: Due to the release kinetics of cTnI,     a negative result within the first hours     of the onset of symptoms does not rule out     myocardial infarction with certainty.     If myocardial infarction is still suspected,     repeat the test at appropriate intervals.  POCT I-STAT, CHEM 8     Status: Abnormal   Collection Time    05/20/13  4:55 PM      Result Value Range   Sodium 144  135 - 145 mEq/L   Potassium 3.4 (*) 3.5 - 5.1 mEq/L   Chloride 103  96 - 112 mEq/L   BUN 17  6 - 23 mg/dL   Creatinine, Ser 6.04 (*) 0.50 - 1.10 mg/dL   Glucose, Bld 97  70 - 99 mg/dL   Calcium, Ion 5.40  9.81 - 1.23 mmol/L   TCO2 29  0 - 100 mmol/L   Hemoglobin 10.5 (*) 12.0 - 15.0 g/dL   HCT 19.1 (*) 47.8 - 29.5 %  GLUCOSE, CAPILLARY     Status: Abnormal   Collection Time    05/20/13  5:03 PM      Result Value Range   Glucose-Capillary 100 (*) 70 - 99 mg/dL   Comment 1 Notify RN    URINE RAPID DRUG SCREEN (HOSP PERFORMED)     Status: None   Collection Time    05/20/13  7:43 PM      Result Value Range   Opiates NONE DETECTED  NONE DETECTED   Cocaine NONE DETECTED  NONE DETECTED   Benzodiazepines NONE DETECTED  NONE DETECTED   Amphetamines NONE DETECTED  NONE DETECTED   Tetrahydrocannabinol NONE DETECTED  NONE DETECTED    Barbiturates NONE DETECTED  NONE DETECTED   Comment:            DRUG SCREEN FOR MEDICAL PURPOSES     ONLY.  IF CONFIRMATION IS NEEDED  FOR ANY PURPOSE, NOTIFY LAB     WITHIN 5 DAYS.                LOWEST DETECTABLE LIMITS     FOR URINE DRUG SCREEN     Drug Class       Cutoff (ng/mL)     Amphetamine      1000     Barbiturate      200     Benzodiazepine   200     Tricyclics       300     Opiates          300     Cocaine          300     THC              50  URINALYSIS, ROUTINE W REFLEX MICROSCOPIC     Status: None   Collection Time    05/20/13  7:43 PM      Result Value Range   Color, Urine YELLOW  YELLOW   APPearance CLEAR  CLEAR   Specific Gravity, Urine 1.015  1.005 - 1.030   pH 6.0  5.0 - 8.0   Glucose, UA NEGATIVE  NEGATIVE mg/dL   Hgb urine dipstick NEGATIVE  NEGATIVE   Bilirubin Urine NEGATIVE  NEGATIVE   Ketones, ur NEGATIVE  NEGATIVE mg/dL   Protein, ur NEGATIVE  NEGATIVE mg/dL   Urobilinogen, UA 1.0  0.0 - 1.0 mg/dL   Nitrite NEGATIVE  NEGATIVE   Leukocytes, UA NEGATIVE  NEGATIVE   Comment: MICROSCOPIC NOT DONE ON URINES WITH NEGATIVE PROTEIN, BLOOD, LEUKOCYTES, NITRITE, OR GLUCOSE <1000 mg/dL.    Ct Head Wo Contrast  05/20/2013   *RADIOLOGY REPORT*  Clinical Data: Dizziness, unsteady gait, left-sided numbness  CT HEAD WITHOUT CONTRAST  Technique:  Contiguous axial images were obtained from the base of the skull through the vertex without contrast.  Comparison: None.  Findings: No evidence of parenchymal hemorrhage or extra-axial fluid collection. No mass lesion, mass effect, or midline shift.  No CT evidence of acute infarction.  Cerebral volume is age appropriate.  No ventriculomegaly.  Mild mucosal thickening of the bilateral sphenoid sinuses.  Mastoid air cells are clear.  No evidence of calvarial fracture.  IMPRESSION: No evidence of acute intracranial abnormality.   Original Report Authenticated By: Charline Bills, M.D.   Mr Brain Wo Contrast  05/20/2013    *RADIOLOGY REPORT*  Clinical Data: Left hemiparesis.  MRI HEAD WITHOUT CONTRAST  Technique:  Multiplanar, multiecho pulse sequences of the brain and surrounding structures were obtained according to standard protocol without intravenous contrast.  Comparison: CT head without contrast 05/20/2013.  Findings: The diffusion weighted images demonstrate no evidence for acute or subacute infarction.  Bilateral subcortical T2 hyperintensities are greater than expected for age.  No acute hemorrhage or mass lesion is present.  The ventricles are of normal size.  No significant extra-axial fluid collection is present. Flow is present in the major intracranial arteries.  The globes and orbits are intact.  A fluid level is present in the left maxillary sinus.  Circumferential mucosal thickening is present as well.  The paranasal sinuses and mastoid air cells are otherwise clear.  IMPRESSION:  1.  No acute intracranial abnormality. 2.  Scattered subcortical T2 hyperintensities are greater than expected for age. The finding is nonspecific but can be seen in the setting of chronic microvascular ischemia, a demyelinating process such as multiple sclerosis, vasculitis, complicated migraine headaches, or  as the sequelae of a prior infectious or inflammatory process. 3.  Left sphenoid sinusitis.   Original Report Authenticated By: Marin Roberts, M.D.     Assessment/Plan: 47 years old with hypertension, DM, hypercholesterolemia, CHF, DVT, comes in with new onset paresthesias and " heaviness" involving left face-arm-and leg. Neuro-exam significant only for subtle left arm/hand weakness and diminished pinprick left side. MRI-DWI showed no acute stroke or mass but non specific scattered subcortical hyper intensities.  I am not quite convinced that the MRI findings are consistent with a demyelinating disorder and instead most likely due to small vessel disease.   Although she is still mildly symptomatic, admission to the  hospital will not change management at this moment (results of oligoclonal bands and IGG index could take couple of days to come back) and thus I suggested further neurological work up as outpatient and she is agreeable to follow that pathway.  Wyatt Portela, MD [facility] [phone]  05/20/2013, 8:37 PM

## 2013-05-20 NOTE — ED Notes (Signed)
Dr.Lockwood at bedside  

## 2013-08-01 ENCOUNTER — Ambulatory Visit: Payer: Medicaid Other | Admitting: *Deleted

## 2013-10-20 ENCOUNTER — Emergency Department (HOSPITAL_COMMUNITY): Payer: Medicaid Other

## 2013-10-20 ENCOUNTER — Encounter (HOSPITAL_COMMUNITY): Payer: Self-pay | Admitting: Emergency Medicine

## 2013-10-20 ENCOUNTER — Emergency Department (HOSPITAL_COMMUNITY)
Admission: EM | Admit: 2013-10-20 | Discharge: 2013-10-20 | Disposition: A | Discharge status: Home / Self Care | Payer: Medicaid Other | Attending: Emergency Medicine | Admitting: Emergency Medicine

## 2013-10-20 DIAGNOSIS — Z87891 Personal history of nicotine dependence: Secondary | ICD-10-CM | POA: Insufficient documentation

## 2013-10-20 DIAGNOSIS — Z8742 Personal history of other diseases of the female genital tract: Secondary | ICD-10-CM | POA: Insufficient documentation

## 2013-10-20 DIAGNOSIS — M7989 Other specified soft tissue disorders: Secondary | ICD-10-CM | POA: Insufficient documentation

## 2013-10-20 DIAGNOSIS — I509 Heart failure, unspecified: Secondary | ICD-10-CM | POA: Insufficient documentation

## 2013-10-20 DIAGNOSIS — Z7982 Long term (current) use of aspirin: Secondary | ICD-10-CM | POA: Insufficient documentation

## 2013-10-20 DIAGNOSIS — I10 Essential (primary) hypertension: Secondary | ICD-10-CM | POA: Insufficient documentation

## 2013-10-20 DIAGNOSIS — M79609 Pain in unspecified limb: Secondary | ICD-10-CM | POA: Insufficient documentation

## 2013-10-20 DIAGNOSIS — M79671 Pain in right foot: Secondary | ICD-10-CM

## 2013-10-20 DIAGNOSIS — D649 Anemia, unspecified: Secondary | ICD-10-CM | POA: Insufficient documentation

## 2013-10-20 DIAGNOSIS — E119 Type 2 diabetes mellitus without complications: Secondary | ICD-10-CM | POA: Insufficient documentation

## 2013-10-20 DIAGNOSIS — Z79899 Other long term (current) drug therapy: Secondary | ICD-10-CM | POA: Insufficient documentation

## 2013-10-20 DIAGNOSIS — Z86718 Personal history of other venous thrombosis and embolism: Secondary | ICD-10-CM | POA: Insufficient documentation

## 2013-10-20 DIAGNOSIS — E669 Obesity, unspecified: Secondary | ICD-10-CM | POA: Insufficient documentation

## 2013-10-20 MED ORDER — HYDROCODONE-ACETAMINOPHEN 5-325 MG PO TABS: 1.0000 | ORAL_TABLET | Freq: Four times a day (QID) | ORAL | Status: DC | PRN

## 2013-10-20 NOTE — ED Notes (Signed)
Pt changed into gown.  Warm blankets given.

## 2013-10-20 NOTE — ED Provider Notes (Signed)
CSN: [id number]     Arrival date & time 10/20/13  1053 History  This chart was scribed for non-physician practitioner Margarita Mail, PA-C working with Mervin Kung, MD by Mercy Moore, ED Scribe. This patient was seen in room TR05C/TR05C and the patient's care was started at 11:43 AM.    Chief Complaint  Patient presents with  . Foot Pain    The history is provided by the patient. No language interpreter was used.   HPI Comments: [name] is a 48 y.o. female who presents to the Emergency Department complaining of pain and mild swelling on the dorsum of right foot that presented two days ago. Patient denies any recent injuries or trauma, but reports increased walking a few days prior to onset. Patient states that pain worsens when she stands and is alleviated by elevation. Patient has been treating pain with Tylenol without relief. Patient has history of CHF and says that when she was admitted to the hospital in 2012 a DVT was found. Denies calf pain and numbness.  Past Medical History  Diagnosis Date  . HTN (hypertension)   . Fatigue   . CHF (congestive heart failure) 04/2011    EF 15-20% from echo 05/19/11  . DOE (dyspnea on exertion)   . Orthopnea   . DVT (deep venous thrombosis) 06/2011  . Anemia   . Menorrhagia     with iron deficient anemia  . Diabetes mellitus without complication   . Obesity    Past Surgical History  Procedure Laterality Date  . Tubal ligation  2006  . Cyst removed from ovary    . Coronary stent placement     Family History  Problem Relation Age of Onset  . Hypertension Mother   . Stroke Father    History  Substance Use Topics  . Smoking status: Former Smoker -- 0.50 packs/day for 5 years    Types: Cigarettes    Quit date: 10/19/1993  . Smokeless tobacco: Not on file  . Alcohol Use: No   OB History   Grav Para Term Preterm Abortions TAB SAB Ect Mult Living                 Review of Systems  Constitutional: Negative for fever.   Musculoskeletal: Positive for arthralgias (Right foot pain.). Negative for myalgias (No calf pain.).  Neurological: Negative for numbness.    Allergies  Detrol  Home Medications   Current Outpatient Rx  Name  Route  Sig  Dispense  Refill  . amLODipine (NORVASC) 5 MG tablet   Oral   Take 5 mg by mouth every evening.          Marland Kitchen aspirin 81 MG tablet   Oral   Take 81 mg by mouth every evening.          . carvedilol (COREG) 6.25 MG tablet   Oral   Take 6.25 mg by mouth 2 (two) times daily.           . Cholecalciferol (VITAMIN D-3) 1000 UNITS CAPS   Oral   Take by mouth daily.         . Ferrous Sulfate (IRON) 325 (65 FE) MG TABS   Oral   Take 1 tablet by mouth 2 (two) times daily.           . furosemide (LASIX) 20 MG tablet   Oral   Take 40 mg by mouth every morning.          Marland Kitchen  glipiZIDE (GLUCOTROL XL) 5 MG 24 hr tablet   Oral   Take 5 mg by mouth daily.         . hydrALAZINE (APRESOLINE) 50 MG tablet   Oral   Take 50 mg by mouth 3 (three) times daily.           Marland Kitchen losartan (COZAAR) 25 MG tablet   Oral   Take 25 mg by mouth daily.         . metFORMIN (GLUCOPHAGE) 850 MG tablet   Oral   Take 850 mg by mouth 2 (two) times daily with a meal.         . pravastatin (PRAVACHOL) 40 MG tablet   Oral   Take 40 mg by mouth every evening.           Triage Vitals: BP 143/87  Pulse 71  Temp(Src) 98 F (36.7 C) (Oral)  Resp 16  SpO2 100%  LMP 01/17/2012 Physical Exam  Nursing note and vitals reviewed. Constitutional: She is oriented to person, place, and time. She appears well-developed and well-nourished. No distress.  HENT:  Head: Normocephalic and atraumatic.  Eyes: Conjunctivae are normal. Right eye exhibits no discharge. Left eye exhibits no discharge.  Neck: Normal range of motion.  Cardiovascular: Normal rate and intact distal pulses.   Pulmonary/Chest: Effort normal. No respiratory distress.  Musculoskeletal: Normal range of motion.  She exhibits no edema.  Neurological: She is alert and oriented to person, place, and time. No sensory deficit.  Skin: Skin is warm and dry. No erythema.  Minimal swelling on dorsum of right foot that extends to toes. No heat, warmth, or erythema.  Psychiatric: She has a normal mood and affect. Thought content normal.    ED Course  Procedures (including critical care time) DIAGNOSTIC STUDIES: Oxygen Saturation is 100% on room air, normal by my interpretation.    COORDINATION OF CARE: 11:47 AM- Will order X-ray of right foot. Pt advised of plan for treatment and pt agrees.    Labs Review Labs Reviewed - No data to display Imaging Review Dg Foot Complete Right  10/20/2013   CLINICAL DATA:  Foot pain, swelling at 3rd metatarsal. Pain radiates into toes.  EXAM: RIGHT FOOT COMPLETE - 3+ VIEW  COMPARISON:  None.  FINDINGS: Bone density along the proximal aspect of the navicular felt represent a secondary ossification center. Early degenerative changes in the 1st MTP joint. No fracture, subluxation or dislocation. Soft tissues are intact.  IMPRESSION: No acute bony abnormality.   Electronically Signed   By: Rolm Baptise M.D.   On: 10/20/2013 12:49    EKG Interpretation   None       MDM   1. Foot pain, right   patient with foot pain and swelling. Minor degenerative changes in the 1st MTP. I personally reviewed the images using our PACS system. Negative for DVT legs BL. Patient is to follow up with her PCP. RICE protocol. I personally reviewed the images using our PACS system.  I personally performed the services described in this documentation, which was scribed in my presence. The recorded information has been reviewed and is accurate.       Margarita Mail, PA-C 10/20/13 1517

## 2013-10-20 NOTE — Progress Notes (Signed)
Right lower extremity venous duplex completed.  Right:  No evidence of DVT, superficial thrombosis, or Baker's cyst.  Left:  Negative for DVT in the common femoral vein.  

## 2013-10-20 NOTE — Discharge Instructions (Signed)
Your DVT scan was negative. You may have a tendinitis in your foot. You will need to follow up with your primary care doctor.  Please rest, ice and elevate your foot.    Tendinitis  Tendinitis is swelling and inflammation of the tendons. Tendons are band-like tissues that connect muscle to bone. Tendinitis commonly occurs in the:  Shoulders (rotator cuff).  Heels (Achilles tendon).  Elbows (triceps tendon). CAUSES  Tendinitis is usually caused by overusing the tendon, muscles, and joints involved. When the tissue surrounding a tendon (synovium) becomes inflamed, it is called tenosynovitis. Tendinitis commonly develops in people whose jobs require repetitive motions.  SYMPTOMS  Pain.  Tenderness.  Mild swelling. DIAGNOSIS  Tendinitis is usually diagnosed by physical exam. Your caregiver may also order X-rays or other imaging tests.  TREATMENT  Your caregiver may recommend certain medicines or exercises for your treatment.  HOME CARE INSTRUCTIONS  Use a sling or splint for as long as directed by your caregiver until the pain decreases.  Put ice on the injured area.  Put ice in a plastic bag.  Place a towel between your skin and the bag.  Leave the ice on for 15-20 minutes, 03-04 times a day.  Avoid using the limb while the tendon is painful. Perform gentle range of motion exercises only as directed by your caregiver. Stop exercises if pain or discomfort increase, unless directed otherwise by your caregiver.  Only take over-the-counter or prescription medicines for pain, discomfort, or fever as directed by your caregiver. SEEK MEDICAL CARE IF:  Your pain and swelling increase.  You develop new, unexplained symptoms, especially increased numbness in the hands. MAKE SURE YOU:  Understand these instructions.  Will watch your condition.  Will get help right away if you are not doing well or get worse. Document Released: 10/02/2000 Document Revised: 12/28/2011 Document Reviewed:  12/22/2010  [facility] Patient Information 2014 [place], [place].

## 2013-10-20 NOTE — ED Notes (Signed)
Knot on top of right foot since Wednesday night.

## 2013-10-23 NOTE — ED Provider Notes (Signed)
Medical screening examination/treatment/procedure(s) were performed by non-physician practitioner and as supervising physician I was immediately available for consultation/collaboration.  EKG Interpretation   None         Ezri Fanguy W. Berkeley Veldman, MD 10/23/13 1925 

## 2014-02-05 ENCOUNTER — Emergency Department (HOSPITAL_COMMUNITY): Payer: BC Managed Care – PPO

## 2014-02-05 ENCOUNTER — Emergency Department (HOSPITAL_COMMUNITY)
Admission: EM | Admit: 2014-02-05 | Discharge: 2014-02-05 | Disposition: A | Discharge status: Home / Self Care | Payer: BC Managed Care – PPO | Attending: Emergency Medicine | Admitting: Emergency Medicine

## 2014-02-05 ENCOUNTER — Encounter (HOSPITAL_COMMUNITY): Payer: Self-pay | Admitting: Emergency Medicine

## 2014-02-05 DIAGNOSIS — J309 Allergic rhinitis, unspecified: Secondary | ICD-10-CM | POA: Insufficient documentation

## 2014-02-05 DIAGNOSIS — E669 Obesity, unspecified: Secondary | ICD-10-CM | POA: Insufficient documentation

## 2014-02-05 DIAGNOSIS — I10 Essential (primary) hypertension: Secondary | ICD-10-CM | POA: Insufficient documentation

## 2014-02-05 DIAGNOSIS — R739 Hyperglycemia, unspecified: Secondary | ICD-10-CM

## 2014-02-05 DIAGNOSIS — Z7982 Long term (current) use of aspirin: Secondary | ICD-10-CM | POA: Insufficient documentation

## 2014-02-05 DIAGNOSIS — Z87891 Personal history of nicotine dependence: Secondary | ICD-10-CM | POA: Insufficient documentation

## 2014-02-05 DIAGNOSIS — R109 Unspecified abdominal pain: Secondary | ICD-10-CM | POA: Insufficient documentation

## 2014-02-05 DIAGNOSIS — Z86718 Personal history of other venous thrombosis and embolism: Secondary | ICD-10-CM | POA: Insufficient documentation

## 2014-02-05 DIAGNOSIS — Z79899 Other long term (current) drug therapy: Secondary | ICD-10-CM | POA: Insufficient documentation

## 2014-02-05 DIAGNOSIS — IMO0001 Reserved for inherently not codable concepts without codable children: Secondary | ICD-10-CM | POA: Insufficient documentation

## 2014-02-05 DIAGNOSIS — R51 Headache: Secondary | ICD-10-CM | POA: Insufficient documentation

## 2014-02-05 DIAGNOSIS — Z3202 Encounter for pregnancy test, result negative: Secondary | ICD-10-CM | POA: Insufficient documentation

## 2014-02-05 DIAGNOSIS — D649 Anemia, unspecified: Secondary | ICD-10-CM | POA: Insufficient documentation

## 2014-02-05 DIAGNOSIS — E119 Type 2 diabetes mellitus without complications: Secondary | ICD-10-CM | POA: Insufficient documentation

## 2014-02-05 DIAGNOSIS — Z9861 Coronary angioplasty status: Secondary | ICD-10-CM | POA: Insufficient documentation

## 2014-02-05 DIAGNOSIS — R42 Dizziness and giddiness: Secondary | ICD-10-CM

## 2014-02-05 DIAGNOSIS — I509 Heart failure, unspecified: Secondary | ICD-10-CM | POA: Insufficient documentation

## 2014-02-05 DIAGNOSIS — Z8742 Personal history of other diseases of the female genital tract: Secondary | ICD-10-CM | POA: Insufficient documentation

## 2014-02-05 LAB — CBC
HCT: 31.9 % — ABNORMAL LOW (ref 36.0–46.0)
Hemoglobin: 10.6 g/dL — ABNORMAL LOW (ref 12.0–15.0)
MCH: 24.3 pg — AB (ref 26.0–34.0)
MCHC: 33.2 g/dL (ref 30.0–36.0)
MCV: 73.2 fL — AB (ref 78.0–100.0)
PLATELETS: 291 10*3/uL (ref 150–400)
RBC: 4.36 MIL/uL (ref 3.87–5.11)
RDW: 13.9 % (ref 11.5–15.5)
WBC: 5.5 10*3/uL (ref 4.0–10.5)

## 2014-02-05 LAB — COMPREHENSIVE METABOLIC PANEL
ALT: 19 U/L (ref 0–35)
AST: 19 U/L (ref 0–37)
Albumin: 3.6 g/dL (ref 3.5–5.2)
Alkaline Phosphatase: 132 U/L — ABNORMAL HIGH (ref 39–117)
BUN: 20 mg/dL (ref 6–23)
CALCIUM: 9.3 mg/dL (ref 8.4–10.5)
CO2: 27 mEq/L (ref 19–32)
CREATININE: 0.86 mg/dL (ref 0.50–1.10)
Chloride: 98 mEq/L (ref 96–112)
GFR calc Af Amer: >90 mL/min (ref >90)
GFR calc non Af Amer: 80 mL/min — ABNORMAL LOW (ref >90)
Glucose, Bld: 276 mg/dL — ABNORMAL HIGH (ref 70–99)
Potassium: 4.3 mEq/L (ref 3.7–5.3)
SODIUM: 140 meq/L (ref 137–147)
TOTAL PROTEIN: 8.1 g/dL (ref 6.0–8.3)
Total Bilirubin: 0.2 mg/dL — ABNORMAL LOW (ref 0.3–1.2)

## 2014-02-05 LAB — URINE MICROSCOPIC-ADD ON

## 2014-02-05 LAB — URINALYSIS, ROUTINE W REFLEX MICROSCOPIC
BILIRUBIN URINE: NEGATIVE
Glucose, UA: 100 mg/dL — AB
HGB URINE DIPSTICK: NEGATIVE
Ketones, ur: NEGATIVE mg/dL
NITRITE: NEGATIVE
PH: 6 (ref 5.0–8.0)
Protein, ur: NEGATIVE mg/dL
SPECIFIC GRAVITY, URINE: 1.016 (ref 1.005–1.030)
UROBILINOGEN UA: 0.2 mg/dL (ref 0.0–1.0)

## 2014-02-05 LAB — PREGNANCY, URINE: PREG TEST UR: NEGATIVE

## 2014-02-05 MED ORDER — ONDANSETRON HCL 4 MG/2ML IJ SOLN
4.0000 mg | Freq: Once | INTRAMUSCULAR | Status: AC
  Administered 2014-02-05: 4 mg via INTRAVENOUS
  Filled 2014-02-05: qty 2

## 2014-02-05 MED ORDER — IOHEXOL 300 MG/ML  SOLN
25.0000 mL | INTRAMUSCULAR | Status: AC
  Administered 2014-02-05: 25 mL via ORAL

## 2014-02-05 MED ORDER — HYDROMORPHONE HCL PF 1 MG/ML IJ SOLN
1.0000 mg | Freq: Once | INTRAMUSCULAR | Status: AC
  Administered 2014-02-05: 1 mg via INTRAVENOUS
  Filled 2014-02-05: qty 1

## 2014-02-05 MED ORDER — IOHEXOL 300 MG/ML  SOLN
100.0000 mL | Freq: Once | INTRAMUSCULAR | Status: AC | PRN
  Administered 2014-02-05: 100 mL via INTRAVENOUS

## 2014-02-05 MED ORDER — SODIUM CHLORIDE 0.9 % IV BOLUS (SEPSIS)
1000.0000 mL | Freq: Once | INTRAVENOUS | Status: AC
  Administered 2014-02-05: 1000 mL via INTRAVENOUS

## 2014-02-05 NOTE — ED Provider Notes (Signed)
CSN: [id number]     Arrival date & time 02/05/14  0710 History   First MD Initiated Contact with Patient 02/05/14 0725     Chief Complaint  Patient presents with  . Dizziness  . Abdominal Pain  . Headache     (Consider location/radiation/quality/duration/timing/severity/associated sxs/prior Treatment) Patient is a 48 y.o. female presenting with dizziness, abdominal pain, and headaches. The history is provided by the patient.  Dizziness Associated symptoms: headaches   Associated symptoms: no blood in stool, no chest pain, no diarrhea, no nausea, no palpitations, no shortness of breath and no vomiting   Abdominal Pain Associated symptoms: no chest pain, no chills, no constipation, no cough, no diarrhea, no dysuria, no fever, no hematuria, no nausea, no shortness of breath, no sore throat, no vaginal bleeding, no vaginal discharge and no vomiting   Headache Associated symptoms: abdominal pain, congestion and dizziness   Associated symptoms: no back pain, no cough, no diarrhea, no pain, no fever, no nausea, no neck pain, no numbness, no sore throat and no vomiting   pt c/o several complaints this morning, indicating when went to be last night felt fine.  Pt states awoke 0400 to get ready for work, noted right lower abd pain, constant, dull, moderate, non radiating, without specific exacerbating or alleviating factors. No associated nvd, normal appetite. No fever or chills. No prior abd surgery. Hx ovarian cyst and hx fibroids. No hx endometriosis. No vaginal discharge or bleeding, lnmp 2/15. No dysuria, hematuria or gu c/o. Pt also c/o intermittent dull frontal headaches in past month. Gradual onset, mild/moderate. No acute or abrupt change in headaches this morning. Recent mild sinus congestion and allergy symptoms. No severe/localized sinus pressure/pain. No sore throat, cough or other uri c/o. No head trauma or fall. No eye pain or change in vision. No positional change to headaches. No  photophobia or phonophobia. No neck pain or stiffness. No numbness/weakness. Normal coordination/gait.  Pt also states earlier this morning felt dizzy when getting up. Describes as lightheaded sensation as if could faint. No loc. No vertigo. No hearing loss or tinnitus. Dizziness has resolved. Denies any blood loss, vaginal bleeding or blood in stools, no melena. No fever or chills.     Past Medical History  Diagnosis Date  . HTN (hypertension)   . Fatigue   . CHF (congestive heart failure) 04/2011    EF 15-20% from echo 05/19/11  . DOE (dyspnea on exertion)   . Orthopnea   . DVT (deep venous thrombosis) 06/2011  . Anemia   . Menorrhagia     with iron deficient anemia  . Diabetes mellitus without complication   . Obesity   . Ovarian cyst    Past Surgical History  Procedure Laterality Date  . Tubal ligation  2006  . Cyst removed from ovary    . Coronary stent placement     Family History  Problem Relation Age of Onset  . Hypertension Mother   . Stroke Father    History  Substance Use Topics  . Smoking status: Former Smoker -- 0.50 packs/day for 5 years    Types: Cigarettes    Quit date: 10/19/1993  . Smokeless tobacco: Not on file  . Alcohol Use: No   OB History   Grav Para Term Preterm Abortions TAB SAB Ect Mult Living                 Review of Systems  Constitutional: Negative for fever and chills.  HENT: Positive for congestion.  Negative for sore throat.   Eyes: Negative for pain, redness and visual disturbance.  Respiratory: Negative for cough and shortness of breath.   Cardiovascular: Negative for chest pain, palpitations and leg swelling.  Gastrointestinal: Positive for abdominal pain. Negative for nausea, vomiting, diarrhea, constipation, blood in stool and abdominal distention.  Endocrine: Negative for polyuria.  Genitourinary: Negative for dysuria, hematuria, flank pain, vaginal bleeding and vaginal discharge.  Musculoskeletal: Negative for back pain and neck  pain.  Skin: Negative for rash.  Allergic/Immunologic: Positive for environmental allergies.  Neurological: Positive for dizziness, light-headedness and headaches. Negative for weakness and numbness.  Hematological: Does not bruise/bleed easily.  Psychiatric/Behavioral: Negative for confusion.      Allergies  Detrol  Home Medications   Prior to Admission medications   Medication Sig Start Date End Date Taking? Authorizing Provider  amLODipine (NORVASC) 5 MG tablet Take 5 mg by mouth every evening.    Yes Historical Provider, MD  aspirin 81 MG tablet Take 81 mg by mouth every evening.    Yes Historical Provider, MD  carvedilol (COREG) 6.25 MG tablet Take 6.25 mg by mouth 2 (two) times daily.    Yes Historical Provider, MD  Cholecalciferol (VITAMIN D-3) 1000 UNITS CAPS Take 1 capsule by mouth daily.    Yes Historical Provider, MD  Ferrous Sulfate (IRON) 325 (65 FE) MG TABS Take 1 tablet by mouth 2 (two) times daily.     Yes Historical Provider, MD  furosemide (LASIX) 40 MG tablet Take 40 mg by mouth.   Yes Historical Provider, MD  losartan (COZAAR) 25 MG tablet Take 25 mg by mouth daily.   Yes Historical Provider, MD  metFORMIN (GLUCOPHAGE) 500 MG tablet Take 500 mg by mouth 2 (two) times daily with a meal.   Yes Historical Provider, MD  pravastatin (PRAVACHOL) 40 MG tablet Take 40 mg by mouth every evening.    Yes Historical Provider, MD   BP 112/69  Pulse 69  Temp(Src) 97.9 F (36.6 C) (Oral)  Resp 18  Ht 5\' 2"  (1.575 m)  SpO2 99%  LMP 12/17/2011 Physical Exam  Nursing note and vitals reviewed. Constitutional: She is oriented to person, place, and time. She appears well-developed and well-nourished. No distress.  HENT:  Nose: Nose normal.  Mouth/Throat: Oropharynx is clear and moist.  No sinus or temporal tenderness.  Eyes: Conjunctivae are normal. Pupils are equal, round, and reactive to light. No scleral icterus.  Neck: Normal range of motion. Neck supple. No tracheal  deviation present. No thyromegaly present.  No bruit. No stiffness or rigidity.   Cardiovascular: Normal rate, regular rhythm, normal heart sounds and intact distal pulses.  Exam reveals no gallop and no friction rub.   No murmur heard. Pulmonary/Chest: Effort normal and breath sounds normal. No respiratory distress.  Abdominal: Soft. Normal appearance and bowel sounds are normal. She exhibits no distension and no mass. There is no tenderness. There is no rebound and no guarding.  Genitourinary:  No cva tenderness  Musculoskeletal: She exhibits no edema and no tenderness.  Neurological: She is alert and oriented to person, place, and time. No cranial nerve deficit.  Steady gait.   Skin: Skin is warm and dry. No rash noted.  Psychiatric: She has a normal mood and affect.    ED Course  Procedures (including critical care time)  Results for orders placed during the hospital encounter of 02/05/14  CBC      Result Value Ref Range   WBC 5.5  4.0 -  10.5 K/uL   RBC 4.36  3.87 - 5.11 MIL/uL   Hemoglobin 10.6 (*) 12.0 - 15.0 g/dL   HCT 31.9 (*) 36.0 - 46.0 %   MCV 73.2 (*) 78.0 - 100.0 fL   MCH 24.3 (*) 26.0 - 34.0 pg   MCHC 33.2  30.0 - 36.0 g/dL   RDW 13.9  11.5 - 15.5 %   Platelets 291  150 - 400 K/uL  COMPREHENSIVE METABOLIC PANEL      Result Value Ref Range   Sodium 140  137 - 147 mEq/L   Potassium 4.3  3.7 - 5.3 mEq/L   Chloride 98  96 - 112 mEq/L   CO2 27  19 - 32 mEq/L   Glucose, Bld 276 (*) 70 - 99 mg/dL   BUN 20  6 - 23 mg/dL   Creatinine, Ser 0.86  0.50 - 1.10 mg/dL   Calcium 9.3  8.4 - 10.5 mg/dL   Total Protein 8.1  6.0 - 8.3 g/dL   Albumin 3.6  3.5 - 5.2 g/dL   AST 19  0 - 37 U/L   ALT 19  0 - 35 U/L   Alkaline Phosphatase 132 (*) 39 - 117 U/L   Total Bilirubin 0.2 (*) 0.3 - 1.2 mg/dL   GFR calc non Af Amer 80 (*) >90 mL/min   GFR calc Af Amer >90  >90 mL/min  URINALYSIS, ROUTINE W REFLEX MICROSCOPIC      Result Value Ref Range   Color, Urine YELLOW  YELLOW    APPearance CLEAR  CLEAR   Specific Gravity, Urine 1.016  1.005 - 1.030   pH 6.0  5.0 - 8.0   Glucose, UA 100 (*) NEGATIVE mg/dL   Hgb urine dipstick NEGATIVE  NEGATIVE   Bilirubin Urine NEGATIVE  NEGATIVE   Ketones, ur NEGATIVE  NEGATIVE mg/dL   Protein, ur NEGATIVE  NEGATIVE mg/dL   Urobilinogen, UA 0.2  0.0 - 1.0 mg/dL   Nitrite NEGATIVE  NEGATIVE   Leukocytes, UA SMALL (*) NEGATIVE  PREGNANCY, URINE      Result Value Ref Range   Preg Test, Ur NEGATIVE  NEGATIVE  URINE MICROSCOPIC-ADD ON      Result Value Ref Range   Squamous Epithelial / LPF RARE  RARE   WBC, UA 0-2  <3 WBC/hpf   Bacteria, UA FEW (*) RARE   Ct Abdomen Pelvis W Contrast  02/05/2014   CLINICAL DATA:  Lower abdominal pain.  EXAM: CT ABDOMEN AND PELVIS WITH CONTRAST  TECHNIQUE: Multidetector CT imaging of the abdomen and pelvis was performed using the standard protocol following bolus administration of intravenous contrast.  CONTRAST:  125mL OMNIPAQUE IOHEXOL 300 MG/ML  SOLN  COMPARISON:  05/15/2008  FINDINGS: There is mild scarring or atelectasis at both lung bases. No pleural or pericardial fluid.  The liver has normal appearance without focal lesions or biliary ductal dilatation. No calcified gallstones. The spleen is normal. The pancreas is normal. The adrenal glands are normal. The kidneys are normal. The aorta and IVC are normal. No retroperitoneal mass or adenopathy. No free intraperitoneal fluid or air. No bowel pathology is seen. The appendix is normal. Uterus and adnexal regions are normal. No bony abnormality.  IMPRESSION: Normal examination.  No cause of pain identified.   Electronically Signed   By: Nelson Chimes M.D.   On: 02/05/2014 11:31      MDM  Iv ns bolus. Dilaudid 1 mg iv, zofran iv.  Reviewed nursing notes and prior charts  for additional history.   Recheck pt asymptomatic.  No faintness or dizziness. No headache.   Recheck abd soft nt. Tolerating fluids. No nv. Afeb.  Pt feels much improved, no  pain, and appears stable for d/c.      Mirna Mires, MD 02/05/14 1153

## 2014-02-05 NOTE — ED Notes (Signed)
Pt states she woke up at 5 am to get ready for work and was experiencing a headache, dizziness, "darkness" and lower abdominal pain.  Pt c/o frontal headache for several weeks.  She laid down for 30 minutes with improved dizziness, but no improvement with headache and abdominal pain.

## 2014-02-05 NOTE — Discharge Instructions (Signed)
Your blood sugar is high today (276) - drink plenty of water, follow diabetic diet, continue your diabetic medication, record your blood sugars, and follow up with primary care doctor in coming week. Your blood count also shows that you are anemic (hemoglobin 10.6), although your blood count is stable when compared to prior lab results - continue iron supplement, and also follow up with your doctor. Return to ER if worse, fainting, fevers, worsening or severe abdominal pain, severe headache, other concern.   You were given pain medication in the ER - no driving for the next 4 hours.    Abdominal Pain, Adult Many things can cause abdominal pain. Usually, abdominal pain is not caused by a disease and will improve without treatment. It can often be observed and treated at home. Your health care provider will do a physical exam and possibly order blood tests and X-rays to help determine the seriousness of your pain. However, in many cases, more time must pass before a clear cause of the pain can be found. Before that point, your health care provider may not know if you need more testing or further treatment. HOME CARE INSTRUCTIONS  Monitor your abdominal pain for any changes. The following actions may help to alleviate any discomfort you are experiencing:  Only take over-the-counter or prescription medicines as directed by your health care provider.  Do not take laxatives unless directed to do so by your health care provider.  Try a clear liquid diet (broth, tea, or water) as directed by your health care provider. Slowly move to a bland diet as tolerated. SEEK MEDICAL CARE IF:  You have unexplained abdominal pain.  You have abdominal pain associated with nausea or diarrhea.  You have pain when you urinate or have a bowel movement.  You experience abdominal pain that wakes you in the night.  You have abdominal pain that is worsened or improved by eating food.  You have abdominal pain that  is worsened with eating fatty foods. SEEK IMMEDIATE MEDICAL CARE IF:   Your pain does not go away within 2 hours.  You have a fever.  You keep throwing up (vomiting).  Your pain is felt only in portions of the abdomen, such as the right side or the left lower portion of the abdomen.  You pass bloody or black tarry stools. MAKE SURE YOU:  Understand these instructions.   Will watch your condition.   Will get help right away if you are not doing well or get worse.  Document Released: 07/15/2005 Document Revised: 07/26/2013 Document Reviewed: 06/14/2013 [facility] Patient Information 2014 [place], [place].    Headaches, Frequently Asked Questions MIGRAINE HEADACHES Q: What is migraine? What causes it? How can I treat it? A: Generally, migraine headaches begin as a dull ache. Then they develop into a constant, throbbing, and pulsating pain. You may experience pain at the temples. You may experience pain at the front or back of one or both sides of the head. The pain is usually accompanied by a combination of:  Nausea.  Vomiting.  Sensitivity to light and noise. Some people (about 15%) experience an aura (see below) before an attack. The cause of migraine is believed to be chemical reactions in the brain. Treatment for migraine may include over-the-counter or prescription medications. It may also include self-help techniques. These include relaxation training and biofeedback.  Q: What is an aura? A: About 15% of people with migraine get an "aura". This is a sign of neurological symptoms that occur  before a migraine headache. You may see wavy or jagged lines, dots, or flashing lights. You might experience tunnel vision or blind spots in one or both eyes. The aura can include visual or auditory hallucinations (something imagined). It may include disruptions in smell (such as strange odors), taste or touch. Other symptoms include:  Numbness.  A "pins and needles"  sensation.  Difficulty in recalling or speaking the correct word. These neurological events may last as long as 60 minutes. These symptoms will fade as the headache begins. Q: What is a trigger? A: Certain physical or environmental factors can lead to or "trigger" a migraine. These include:  Foods.  Hormonal changes.  Weather.  Stress. It is important to remember that triggers are different for everyone. To help prevent migraine attacks, you need to figure out which triggers affect you. Keep a headache diary. This is a good way to track triggers. The diary will help you talk to your healthcare professional about your condition. Q: Does weather affect migraines? A: Bright sunshine, hot, humid conditions, and drastic changes in barometric pressure may lead to, or "trigger," a migraine attack in some people. But studies have shown that weather does not act as a trigger for everyone with migraines. Q: What is the link between migraine and hormones? A: Hormones start and regulate many of your body's functions. Hormones keep your body in balance within a constantly changing environment. The levels of hormones in your body are unbalanced at times. Examples are during menstruation, pregnancy, or menopause. That can lead to a migraine attack. In fact, about three quarters of all women with migraine report that their attacks are related to the menstrual cycle.  Q: Is there an increased risk of stroke for migraine sufferers? A: The likelihood of a migraine attack causing a stroke is very remote. That is not to say that migraine sufferers cannot have a stroke associated with their migraines. In persons under age 38, the most common associated factor for stroke is migraine headache. But over the course of a person's normal life span, the occurrence of migraine headache may actually be associated with a reduced risk of dying from cerebrovascular disease due to stroke.  Q: What are acute medications for  migraine? A: Acute medications are used to treat the pain of the headache after it has started. Examples over-the-counter medications, NSAIDs, ergots, and triptans.  Q: What are the triptans? A: Triptans are the newest class of abortive medications. They are specifically targeted to treat migraine. Triptans are vasoconstrictors. They moderate some chemical reactions in the brain. The triptans work on receptors in your brain. Triptans help to restore the balance of a neurotransmitter called serotonin. Fluctuations in levels of serotonin are thought to be a main cause of migraine.  Q: Are over-the-counter medications for migraine effective? A: Over-the-counter, or "OTC," medications may be effective in relieving mild to moderate pain and associated symptoms of migraine. But you should see your caregiver before beginning any treatment regimen for migraine.  Q: What are preventive medications for migraine? A: Preventive medications for migraine are sometimes referred to as "prophylactic" treatments. They are used to reduce the frequency, severity, and length of migraine attacks. Examples of preventive medications include antiepileptic medications, antidepressants, beta-blockers, calcium channel blockers, and NSAIDs (nonsteroidal anti-inflammatory drugs). Q: Why are anticonvulsants used to treat migraine? A: During the past few years, there has been an increased interest in antiepileptic drugs for the prevention of migraine. They are sometimes referred to as "anticonvulsants". Both epilepsy  and migraine may be caused by similar reactions in the brain.  Q: Why are antidepressants used to treat migraine? A: Antidepressants are typically used to treat people with depression. They may reduce migraine frequency by regulating chemical levels, such as serotonin, in the brain.  Q: What alternative therapies are used to treat migraine? A: The term "alternative therapies" is often used to describe treatments  considered outside the scope of conventional Western medicine. Examples of alternative therapy include acupuncture, acupressure, and yoga. Another common alternative treatment is herbal therapy. Some herbs are believed to relieve headache pain. Always discuss alternative therapies with your caregiver before proceeding. Some herbal products contain arsenic and other toxins. TENSION HEADACHES Q: What is a tension-type headache? What causes it? How can I treat it? A: Tension-type headaches occur randomly. They are often the result of temporary stress, anxiety, fatigue, or anger. Symptoms include soreness in your temples, a tightening band-like sensation around your head (a "vice-like" ache). Symptoms can also include a pulling feeling, pressure sensations, and contracting head and neck muscles. The headache begins in your forehead, temples, or the back of your head and neck. Treatment for tension-type headache may include over-the-counter or prescription medications. Treatment may also include self-help techniques such as relaxation training and biofeedback. CLUSTER HEADACHES Q: What is a cluster headache? What causes it? How can I treat it? A: Cluster headache gets its name because the attacks come in groups. The pain arrives with little, if any, warning. It is usually on one side of the head. A tearing or bloodshot eye and a runny nose on the same side of the headache may also accompany the pain. Cluster headaches are believed to be caused by chemical reactions in the brain. They have been described as the most severe and intense of any headache type. Treatment for cluster headache includes prescription medication and oxygen. SINUS HEADACHES Q: What is a sinus headache? What causes it? How can I treat it? A: When a cavity in the bones of the face and skull (a sinus) becomes inflamed, the inflammation will cause localized pain. This condition is usually the result of an allergic reaction, a tumor, or an  infection. If your headache is caused by a sinus blockage, such as an infection, you will probably have a fever. An x-ray will confirm a sinus blockage. Your caregiver's treatment might include antibiotics for the infection, as well as antihistamines or decongestants.  REBOUND HEADACHES Q: What is a rebound headache? What causes it? How can I treat it? A: A pattern of taking acute headache medications too often can lead to a condition known as "rebound headache." A pattern of taking too much headache medication includes taking it more than 2 days per week or in excessive amounts. That means more than the label or a caregiver advises. With rebound headaches, your medications not only stop relieving pain, they actually begin to cause headaches. Doctors treat rebound headache by tapering the medication that is being overused. Sometimes your caregiver will gradually substitute a different type of treatment or medication. Stopping may be a challenge. Regularly overusing a medication increases the potential for serious side effects. Consult a caregiver if you regularly use headache medications more than 2 days per week or more than the label advises. ADDITIONAL QUESTIONS AND ANSWERS Q: What is biofeedback? A: Biofeedback is a self-help treatment. Biofeedback uses special equipment to monitor your body's involuntary physical responses. Biofeedback monitors:  Breathing.  Pulse.  Heart rate.  Temperature.  Muscle tension.  Brain  activity. Biofeedback helps you refine and perfect your relaxation exercises. You learn to control the physical responses that are related to stress. Once the technique has been mastered, you do not need the equipment any more. Q: Are headaches hereditary? A: Four out of five (80%) of people that suffer report a family history of migraine. Scientists are not sure if this is genetic or a family predisposition. Despite the uncertainty, a child has a 50% chance of having migraine if  one parent suffers. The child has a 75% chance if both parents suffer.  Q: Can children get headaches? A: By the time they reach high school, most young people have experienced some type of headache. Many safe and effective approaches or medications can prevent a headache from occurring or stop it after it has begun.  Q: What type of doctor should I see to diagnose and treat my headache? A: Start with your primary caregiver. Discuss his or her experience and approach to headaches. Discuss methods of classification, diagnosis, and treatment. Your caregiver may decide to recommend you to a headache specialist, depending upon your symptoms or other physical conditions. Having diabetes, allergies, etc., may require a more comprehensive and inclusive approach to your headache. The National Headache Foundation will provide, upon request, a list of [facility] physician members in your state. Document Released: 12/26/2003 Document Revised: 12/28/2011 Document Reviewed: 06/04/2008 [facility] Patient Information 2014 [place], [place].   Sinus Headache A sinus headache happens when your sinuses become clogged or puffy (swollen). Sinus headaches can be mild or severe. HOME CARE  Take your medicines (antibiotics) as told. Finish them even if you start to feel better.  Only take medicine as told by your doctor.  Use a nose spray if you feel stuffed up (congested). GET HELP RIGHT AWAY IF:  You have a fever.  You have trouble seeing.  You suddenly have pain in your face or head.  You start to twitch or shake (seizure).  You are confused.  You get headaches more than once a week.  Light or sound bothers you.  You feel sick to your stomach (nauseous) or throw up (vomit).  Your headaches do not get better with treatment. MAKE SURE YOU:  Understand these instructions.  Will watch your condition.  Will get help right away if you are not doing well or get worse. Document Released: 02/04/2011 Document  Revised: 12/28/2011 Document Reviewed: 02/04/2011 [facility] Patient Information 2014 [place], [place].   Dizziness Dizziness is a common problem. It is a feeling of unsteadiness or lightheadedness. You may feel like you are about to faint. Dizziness can lead to injury if you stumble or fall. A person of any age group can suffer from dizziness, but dizziness is more common in older adults. CAUSES  Dizziness can be caused by many different things, including:  Middle ear problems.  Standing for too long.  Infections.  An allergic reaction.  Aging.  An emotional response to something, such as the sight of blood.  Side effects of medicines.  Fatigue.  Problems with circulation or blood pressure.  Excess use of alcohol, medicines, or illegal drug use.  Breathing too fast (hyperventilation).  An arrhythmia or problems with your heart rhythm.  Low red blood cell count (anemia).  Pregnancy.  Vomiting, diarrhea, fever, or other illnesses that cause dehydration.  Diseases or conditions such as Parkinson's disease, high blood pressure (hypertension), diabetes, and thyroid problems.  Exposure to extreme heat. DIAGNOSIS  To find the cause of your dizziness, your caregiver  may do a physical exam, lab tests, radiologic imaging scans, or an electrocardiography test (ECG).  TREATMENT  Treatment of dizziness depends on the cause of your symptoms and can vary greatly. HOME CARE INSTRUCTIONS   Drink enough fluids to keep your urine clear or pale yellow. This is especially important in very hot weather. In the elderly, it is also important in cold weather.  If your dizziness is caused by medicines, take them exactly as directed. When taking blood pressure medicines, it is especially important to get up slowly.  Rise slowly from chairs and steady yourself until you feel okay.  In the morning, first sit up on the side of the bed. When this seems okay, stand slowly while holding onto  something until you know your balance is fine.  If you need to stand in one place for a long time, be sure to move your legs often. Tighten and relax the muscles in your legs while standing.  If dizziness continues to be a problem, have someone stay with you for a day or two. Do this until you feel you are well enough to stay alone. Have the person call your caregiver if he or she notices changes in you that are concerning.  Do not drive or use heavy machinery if you feel dizzy.  Do not drink alcohol. SEEK IMMEDIATE MEDICAL CARE IF:   Your dizziness or lightheadedness gets worse.  You feel nauseous or vomit.  You develop problems with talking, walking, weakness, or using your arms, hands, or legs.  You are not thinking clearly or you have difficulty forming sentences. It may take a friend or family member to determine if your thinking is normal.  You develop chest pain, abdominal pain, shortness of breath, or sweating.  Your vision changes.  You notice any bleeding.  You have side effects from medicine that seems to be getting worse rather than better. MAKE SURE YOU:   Understand these instructions.  Will watch your condition.  Will get help right away if you are not doing well or get worse. Document Released: 03/31/2001 Document Revised: 12/28/2011 Document Reviewed: 04/24/2011 [facility] Patient Information 2014 [place], [place].   Diabetes Meal Planning Guide The diabetes meal planning guide is a tool to help you plan your meals and snacks. It is important for people with diabetes to manage their blood glucose (sugar) levels. Choosing the right foods and the right amounts throughout your day will help control your blood glucose. Eating right can even help you improve your blood pressure and reach or maintain a healthy weight. CARBOHYDRATE COUNTING MADE EASY When you eat carbohydrates, they turn to sugar. This raises your blood glucose level. Counting carbohydrates can help you  control this level so you feel better. When you plan your meals by counting carbohydrates, you can have more flexibility in what you eat and balance your medicine with your food intake. Carbohydrate counting simply means adding up the total amount of carbohydrate grams in your meals and snacks. Try to eat about the same amount at each meal. Foods with carbohydrates are listed below. Each portion below is 1 carbohydrate serving or 15 grams of carbohydrates. Ask your dietician how many grams of carbohydrates you should eat at each meal or snack. Grains and Starches  1 slice bread.   English muffin or hotdog/hamburger bun.   cup cold cereal (unsweetened).   cup cooked pasta or rice.   cup starchy vegetables (corn, potatoes, peas, beans, winter squash).  1 tortilla (6 inches).  bagel.  1 waffle or pancake (size of a CD).   cup cooked cereal.  4 to 6 small crackers. *Whole grain is recommended. Fruit  1 cup fresh unsweetened berries, melon, papaya, pineapple.  1 small fresh fruit.   banana or mango.   cup fruit juice (4 oz unsweetened).   cup canned fruit in natural juice or water.  2 tbs dried fruit.  12 to 15 grapes or cherries. Milk and Yogurt  1 cup fat-free or 1% milk.  1 cup soy milk.  6 oz light yogurt with sugar-free sweetener.  6 oz low-fat soy yogurt.  6 oz plain yogurt. Vegetables  1 cup raw or  cup cooked is counted as 0 carbohydrates or a "free" food.  If you eat 3 or more servings at 1 meal, count them as 1 carbohydrate serving. Other Carbohydrates   oz chips or pretzels.   cup ice cream or frozen yogurt.   cup sherbet or sorbet.  2 inch square cake, no frosting.  1 tbs honey, sugar, jam, jelly, or syrup.  2 small cookies.  3 squares of graham crackers.  3 cups popcorn.  6 crackers.  1 cup broth-based soup.  Count 1 cup casserole or other mixed foods as 2 carbohydrate servings.  Foods with less than 20 calories in a  serving may be counted as 0 carbohydrates or a "free" food. You may want to purchase a book or computer software that lists the carbohydrate gram counts of different foods. In addition, the nutrition facts panel on the labels of the foods you eat are a good source of this information. The label will tell you how big the serving size is and the total number of carbohydrate grams you will be eating per serving. Divide this number by 15 to obtain the number of carbohydrate servings in a portion. Remember, 1 carbohydrate serving equals 15 grams of carbohydrate. SERVING SIZES Measuring foods and serving sizes helps you make sure you are getting the right amount of food. The list below tells how big or small some common serving sizes are.  1 oz.........4 stacked dice.  3 oz........Marland KitchenDeck of cards.  1 tsp.......Marland KitchenTip of little finger.  1 tbs......Marland KitchenMarland KitchenThumb.  2 tbs.......Marland KitchenGolf ball.   cup......Marland KitchenHalf of a fist.  1 cup.......Marland KitchenA fist. SAMPLE DIABETES MEAL PLAN Below is a sample meal plan that includes foods from the grain and starches, dairy, vegetable, fruit, and meat groups. A dietician can individualize a meal plan to fit your calorie needs and tell you the number of servings needed from each food group. However, controlling the total amount of carbohydrates in your meal or snack is more important than making sure you include all of the food groups at every meal. You may interchange carbohydrate containing foods (dairy, starches, and fruits). The meal plan below is an example of a 2000 calorie diet using carbohydrate counting. This meal plan has 17 carbohydrate servings. Breakfast  1 cup oatmeal (2 carb servings).   cup light yogurt (1 carb serving).  1 cup blueberries (1 carb serving).   cup almonds. Snack  1 large apple (2 carb servings).  1 low-fat string cheese stick. Lunch  Chicken breast salad.  1 cup spinach.   cup chopped tomatoes.  2 oz chicken breast, sliced.  2 tbs  low-fat [place] dressing.  12 whole-wheat crackers (2 carb servings).  12 to 15 grapes (1 carb serving).  1 cup low-fat milk (1 carb serving). Snack  1 cup carrots.   cup hummus (1  carb serving). Dinner  3 oz broiled salmon.  1 cup brown rice (3 carb servings). Snack  1  cups steamed broccoli (1 carb serving) drizzled with 1 tsp olive oil and lemon juice.  1 cup light pudding (2 carb servings). DIABETES MEAL PLANNING WORKSHEET Your dietician can use this worksheet to help you decide how many servings of foods and what types of foods are right for you.  BREAKFAST Food Group and Servings / Carb Servings Grain/Starches __________________________________ Dairy __________________________________________ Vegetable ______________________________________ Fruit ___________________________________________ Meat __________________________________________ Fat ____________________________________________ LUNCH Food Group and Servings / Carb Servings Grain/Starches ___________________________________ Dairy ___________________________________________ Fruit ____________________________________________ Meat ___________________________________________ Fat _____________________________________________ Laural GoldenINNER Food Group and Servings / Carb Servings Grain/Starches ___________________________________ Dairy ___________________________________________ Fruit ____________________________________________ Meat ___________________________________________ Fat _____________________________________________ SNACKS Food Group and Servings / Carb Servings Grain/Starches ___________________________________ Dairy ___________________________________________ Vegetable _______________________________________ Fruit ____________________________________________ Meat ___________________________________________ Fat _____________________________________________ DAILY TOTALS Starches  _________________________ Vegetable ________________________ Fruit ____________________________ Dairy ____________________________ Meat ____________________________ Fat ______________________________ Document Released: 07/02/2005 Document Revised: 12/28/2011 Document Reviewed: 05/13/2009 [employer] Patient Information 2014 [place], [place].

## 2014-02-05 NOTE — ED Notes (Signed)
CT called and notified pt has completed contrast.

## 2014-05-07 ENCOUNTER — Other Ambulatory Visit: Payer: Self-pay | Admitting: Internal Medicine

## 2014-05-07 DIAGNOSIS — R109 Unspecified abdominal pain: Secondary | ICD-10-CM

## 2014-05-10 ENCOUNTER — Other Ambulatory Visit: Payer: BC Managed Care – PPO

## 2014-05-10 ENCOUNTER — Ambulatory Visit
Admission: RE | Admit: 2014-05-10 | Discharge: 2014-05-10 | Disposition: A | Discharge status: Home / Self Care | Payer: Medicaid Other | Source: Ambulatory Visit | Attending: Internal Medicine | Admitting: Internal Medicine

## 2014-05-10 DIAGNOSIS — R109 Unspecified abdominal pain: Secondary | ICD-10-CM

## 2014-05-10 MED ORDER — IOHEXOL 300 MG/ML  SOLN
125.0000 mL | Freq: Once | INTRAMUSCULAR | Status: AC | PRN
  Administered 2014-05-10: 125 mL via INTRAVENOUS

## 2014-05-12 ENCOUNTER — Encounter (HOSPITAL_COMMUNITY): Payer: Self-pay | Admitting: Emergency Medicine

## 2014-05-12 ENCOUNTER — Emergency Department (HOSPITAL_COMMUNITY)
Admission: EM | Admit: 2014-05-12 | Discharge: 2014-05-13 | Disposition: A | Discharge status: Home / Self Care | Payer: Medicaid Other | Attending: Emergency Medicine | Admitting: Emergency Medicine

## 2014-05-12 DIAGNOSIS — Z86718 Personal history of other venous thrombosis and embolism: Secondary | ICD-10-CM | POA: Diagnosis not present

## 2014-05-12 DIAGNOSIS — E669 Obesity, unspecified: Secondary | ICD-10-CM | POA: Diagnosis not present

## 2014-05-12 DIAGNOSIS — Z87891 Personal history of nicotine dependence: Secondary | ICD-10-CM | POA: Diagnosis not present

## 2014-05-12 DIAGNOSIS — M25579 Pain in unspecified ankle and joints of unspecified foot: Secondary | ICD-10-CM | POA: Diagnosis not present

## 2014-05-12 DIAGNOSIS — Z8742 Personal history of other diseases of the female genital tract: Secondary | ICD-10-CM | POA: Diagnosis not present

## 2014-05-12 DIAGNOSIS — Z7982 Long term (current) use of aspirin: Secondary | ICD-10-CM | POA: Insufficient documentation

## 2014-05-12 DIAGNOSIS — Z9861 Coronary angioplasty status: Secondary | ICD-10-CM | POA: Insufficient documentation

## 2014-05-12 DIAGNOSIS — I10 Essential (primary) hypertension: Secondary | ICD-10-CM | POA: Diagnosis not present

## 2014-05-12 DIAGNOSIS — D649 Anemia, unspecified: Secondary | ICD-10-CM | POA: Insufficient documentation

## 2014-05-12 DIAGNOSIS — M79609 Pain in unspecified limb: Secondary | ICD-10-CM | POA: Insufficient documentation

## 2014-05-12 DIAGNOSIS — M25469 Effusion, unspecified knee: Secondary | ICD-10-CM | POA: Insufficient documentation

## 2014-05-12 DIAGNOSIS — Z79899 Other long term (current) drug therapy: Secondary | ICD-10-CM | POA: Insufficient documentation

## 2014-05-12 DIAGNOSIS — M25569 Pain in unspecified knee: Secondary | ICD-10-CM | POA: Insufficient documentation

## 2014-05-12 DIAGNOSIS — E119 Type 2 diabetes mellitus without complications: Secondary | ICD-10-CM | POA: Insufficient documentation

## 2014-05-12 DIAGNOSIS — I509 Heart failure, unspecified: Secondary | ICD-10-CM | POA: Diagnosis not present

## 2014-05-12 DIAGNOSIS — M79605 Pain in left leg: Secondary | ICD-10-CM

## 2014-05-12 NOTE — ED Notes (Signed)
Presents with left leg pain began yesterday behind left knee, HX of DVT in riught leg 4 years ago, this feels the same. CMS intact. deines SOB ans chest pain.

## 2014-05-13 ENCOUNTER — Ambulatory Visit (HOSPITAL_COMMUNITY)
Admission: RE | Admit: 2014-05-13 | Discharge: 2014-05-13 | Disposition: A | Discharge status: Home / Self Care | Payer: Medicaid Other | Source: Ambulatory Visit | Attending: Emergency Medicine | Admitting: Emergency Medicine

## 2014-05-13 DIAGNOSIS — M7989 Other specified soft tissue disorders: Secondary | ICD-10-CM | POA: Insufficient documentation

## 2014-05-13 DIAGNOSIS — M79609 Pain in unspecified limb: Secondary | ICD-10-CM | POA: Insufficient documentation

## 2014-05-13 MED ORDER — OXYCODONE-ACETAMINOPHEN 5-325 MG PO TABS
1.0000 | ORAL_TABLET | Freq: Once | ORAL | Status: AC
  Administered 2014-05-13: 1 via ORAL
  Filled 2014-05-13: qty 1

## 2014-05-13 MED ORDER — OXYCODONE-ACETAMINOPHEN 5-325 MG PO TABS: 1.0000 | ORAL_TABLET | ORAL | Status: DC | PRN

## 2014-05-13 MED ORDER — ENOXAPARIN SODIUM 120 MG/0.8ML ~~LOC~~ SOLN
120.0000 mg | Freq: Once | SUBCUTANEOUS | Status: AC
  Administered 2014-05-13: 120 mg via SUBCUTANEOUS
  Filled 2014-05-13: qty 2

## 2014-05-13 NOTE — ED Provider Notes (Signed)
Medical screening examination/treatment/procedure(s) were conducted as a shared visit with non-physician practitioner(s) or resident  and myself.  I personally evaluated the patient during the encounter and agree with the findings.   I have personally reviewed any xrays and/ or EKG's with the provider and I agree with interpretation.   Patient with history of right DVT, congestive heart failure and diabetes presents with left leg pain behind the calf without injury. On exam patient has mild tenderness right calf and behind the left knee without signs of infection, no significant swelling, patient denies chest pain shortness breath and well-appearing. Plan for Lovenox and ultrasound formal done tomorrow. Patient comfortable the plan reasons to return given.  Left leg pain  Mariea Clonts, MD 05/13/14 [phone]

## 2014-05-13 NOTE — ED Notes (Signed)
Patient presents stating she started having pain in her left leg earlier in the day.  Denies falling or any long car trips.  Left calf is tender to touch .

## 2014-05-13 NOTE — Discharge Instructions (Signed)
Please report to radiology at [facility] tomorrow morning. Take Percocet as prescribed as needed for pain. Keep the leg elevated. Followup with primary care doctor if your ultrasound is negative tomorrow

## 2014-05-13 NOTE — ED Provider Notes (Signed)
CSN: [id number]     Arrival date & time 05/12/14  2330 History   First MD Initiated Contact with Patient 05/13/14 0001     Chief Complaint  Patient presents with  . Leg Pain     (Consider location/radiation/quality/duration/timing/severity/associated sxs/prior Treatment) HPI [name] is a 48 y.o. female who presents to emergency department complaining of left leg pain. Patient states her pain began yesterday. States pain is in the back of her left calf and left knee. She denies any injuries. She states she feels like her leg swelling. She denies any back pain, denies any numbness or weakness in the leg. States pain is sharp, worsened with walking. She states she has had similar pain 3 years ago and the other leg, and was diagnosed with a blood clot. Patient states she was on Coumadin for some time, however currently and is not on any blood thinning medications. She denies any recent trips, long car rides, no recent surgeries or procedures. She denies any fever, chills. No other complaints.  Past Medical History  Diagnosis Date  . HTN (hypertension)   . Fatigue   . CHF (congestive heart failure) 04/2011    EF 15-20% from echo 05/19/11  . DOE (dyspnea on exertion)   . Orthopnea   . DVT (deep venous thrombosis) 06/2011  . Anemia   . Menorrhagia     with iron deficient anemia  . Diabetes mellitus without complication   . Obesity   . Ovarian cyst    Past Surgical History  Procedure Laterality Date  . Tubal ligation  2006  . Cyst removed from ovary    . Coronary stent placement     Family History  Problem Relation Age of Onset  . Hypertension Mother   . Stroke Father    History  Substance Use Topics  . Smoking status: Former Smoker -- 0.50 packs/day for 5 years    Types: Cigarettes    Quit date: 10/19/1993  . Smokeless tobacco: Not on file  . Alcohol Use: No   OB History   Grav Para Term Preterm Abortions TAB SAB Ect Mult Living                 Review of Systems   Constitutional: Negative for fever and chills.  Respiratory: Negative for cough, chest tightness and shortness of breath.   Genitourinary: Negative for dysuria.  Musculoskeletal: Positive for arthralgias and joint swelling. Negative for neck pain and neck stiffness.  Skin: Negative for rash.  Neurological: Negative for dizziness, weakness and headaches.  All other systems reviewed and are negative.     Allergies  Detrol  Home Medications   Prior to Admission medications   Medication Sig Start Date End Date Taking? Authorizing Provider  amLODipine (NORVASC) 5 MG tablet Take 5 mg by mouth every evening.     Historical Provider, MD  aspirin 81 MG tablet Take 81 mg by mouth every evening.     Historical Provider, MD  carvedilol (COREG) 6.25 MG tablet Take 6.25 mg by mouth 2 (two) times daily.     Historical Provider, MD  Cholecalciferol (VITAMIN D-3) 1000 UNITS CAPS Take 1 capsule by mouth daily.     Historical Provider, MD  Ferrous Sulfate (IRON) 325 (65 FE) MG TABS Take 1 tablet by mouth 2 (two) times daily.      Historical Provider, MD  furosemide (LASIX) 40 MG tablet Take 40 mg by mouth.    Historical Provider, MD  losartan (COZAAR) 25  MG tablet Take 25 mg by mouth daily.    Historical Provider, MD  metFORMIN (GLUCOPHAGE) 500 MG tablet Take 500 mg by mouth 2 (two) times daily with a meal.    Historical Provider, MD  pravastatin (PRAVACHOL) 40 MG tablet Take 40 mg by mouth every evening.     Historical Provider, MD   BP 139/64  Pulse 71  Temp(Src) 98.2 F (36.8 C) (Oral)  Resp 20  SpO2 100%  LMP 01/10/2014 Physical Exam  Nursing note and vitals reviewed. Constitutional: She appears well-developed and well-nourished. No distress.  HENT:  Head: Normocephalic.  Eyes: Conjunctivae are normal.  Neck: Neck supple.  Cardiovascular: Normal rate, regular rhythm and normal heart sounds.   Pulmonary/Chest: Effort normal and breath sounds normal. No respiratory distress. She has no  wheezes. She has no rales.  Musculoskeletal: She exhibits no edema.  No obvious swelling noted to the left leg. Tender to palpation behind left knee, and in the left posterior calf. Pain with ankle dorsiflexion, and positive Homans sign. Full range of motion of the left knee, with some pain. Negative anterior posterior drawer signs. Dorsal pedal pulses intact. Foot is warm. Lower leg compartments are soft.  Neurological: She is alert.  Skin: Skin is warm and dry.  Psychiatric: She has a normal mood and affect. Her behavior is normal.    ED Course  Procedures (including critical care time) Labs Review Labs Reviewed - No data to display  Imaging Review No results found.   EKG Interpretation None      MDM   Final diagnoses:  Left leg pain    Patient's with left lower leg pain and subjective swelling. Neurovascularly intact at this time. She is concerned about possible DVT, given she has had similar pain in the right leg in 2012 and had a blood clot. At this time are unable to get venous Dopplers in emergency department, will schedule her for one tomorrow morning. Patient received 1 mg per kilogram of Lovenox at this time for anticoagulation. She denies any shortness of breath or chest pain. Her vital signs are normal. I am not concerned for possible PE. She also received one Percocet in emergency department: Discharge home with 15 tabs of Percocet, and return tomorrow for a venous Doppler. Patient started to return immediately she develops any shortness of breath or chest pain, dizziness, syncopal episodes, blood in her sputum.   Filed Vitals:   05/12/14 2343  BP: 139/64  Pulse: 71  Temp: 98.2 F (36.8 C)  TempSrc: Oral  Resp: 20  SpO2: 100%       Render Marley A Ki Corbo, PA-C 05/13/14 0121

## 2014-05-13 NOTE — Progress Notes (Signed)
VASCULAR LAB PRELIMINARY  PRELIMINARY  PRELIMINARY  PRELIMINARY  Left lower extremity venous Doppler completed.    Preliminary report:  There is no DVT or SVT noted in the left lower extremity.   Reagen Haberman, RVT 05/13/2014, 9:14 AM

## 2014-05-13 NOTE — ED Notes (Signed)
Discharge inst and prescription given  Voiced understanding.  Instructed to return to the ED at 830am for doppler.  Voiced understanding.

## 2014-08-26 ENCOUNTER — Emergency Department (HOSPITAL_COMMUNITY): Payer: BC Managed Care – PPO

## 2014-08-26 ENCOUNTER — Emergency Department (HOSPITAL_COMMUNITY)
Admission: EM | Admit: 2014-08-26 | Discharge: 2014-08-26 | Disposition: A | Discharge status: Home / Self Care | Payer: BC Managed Care – PPO | Attending: Emergency Medicine | Admitting: Emergency Medicine

## 2014-08-26 ENCOUNTER — Encounter (HOSPITAL_COMMUNITY): Payer: Self-pay | Admitting: Nurse Practitioner

## 2014-08-26 DIAGNOSIS — Z79899 Other long term (current) drug therapy: Secondary | ICD-10-CM | POA: Diagnosis not present

## 2014-08-26 DIAGNOSIS — Z9889 Other specified postprocedural states: Secondary | ICD-10-CM | POA: Diagnosis not present

## 2014-08-26 DIAGNOSIS — Z8742 Personal history of other diseases of the female genital tract: Secondary | ICD-10-CM | POA: Insufficient documentation

## 2014-08-26 DIAGNOSIS — Y9389 Activity, other specified: Secondary | ICD-10-CM | POA: Diagnosis not present

## 2014-08-26 DIAGNOSIS — Z7982 Long term (current) use of aspirin: Secondary | ICD-10-CM | POA: Diagnosis not present

## 2014-08-26 DIAGNOSIS — I10 Essential (primary) hypertension: Secondary | ICD-10-CM | POA: Diagnosis not present

## 2014-08-26 DIAGNOSIS — Z87891 Personal history of nicotine dependence: Secondary | ICD-10-CM | POA: Diagnosis not present

## 2014-08-26 DIAGNOSIS — Y998 Other external cause status: Secondary | ICD-10-CM | POA: Insufficient documentation

## 2014-08-26 DIAGNOSIS — S6991XA Unspecified injury of right wrist, hand and finger(s), initial encounter: Secondary | ICD-10-CM | POA: Diagnosis not present

## 2014-08-26 DIAGNOSIS — I509 Heart failure, unspecified: Secondary | ICD-10-CM | POA: Diagnosis not present

## 2014-08-26 DIAGNOSIS — Y9289 Other specified places as the place of occurrence of the external cause: Secondary | ICD-10-CM | POA: Insufficient documentation

## 2014-08-26 DIAGNOSIS — S199XXA Unspecified injury of neck, initial encounter: Secondary | ICD-10-CM | POA: Diagnosis not present

## 2014-08-26 DIAGNOSIS — Z86718 Personal history of other venous thrombosis and embolism: Secondary | ICD-10-CM | POA: Insufficient documentation

## 2014-08-26 DIAGNOSIS — D649 Anemia, unspecified: Secondary | ICD-10-CM | POA: Diagnosis not present

## 2014-08-26 DIAGNOSIS — S59901A Unspecified injury of right elbow, initial encounter: Secondary | ICD-10-CM | POA: Insufficient documentation

## 2014-08-26 DIAGNOSIS — S4991XA Unspecified injury of right shoulder and upper arm, initial encounter: Secondary | ICD-10-CM | POA: Diagnosis not present

## 2014-08-26 DIAGNOSIS — E669 Obesity, unspecified: Secondary | ICD-10-CM | POA: Insufficient documentation

## 2014-08-26 DIAGNOSIS — E119 Type 2 diabetes mellitus without complications: Secondary | ICD-10-CM | POA: Insufficient documentation

## 2014-08-26 MED ORDER — IBUPROFEN 600 MG PO TABS: 600.0000 mg | ORAL_TABLET | Freq: Three times a day (TID) | ORAL | Status: AC

## 2014-08-26 MED ORDER — KETOROLAC TROMETHAMINE 30 MG/ML IJ SOLN
30.0000 mg | Freq: Once | INTRAMUSCULAR | Status: AC
  Administered 2014-08-26: 30 mg via INTRAMUSCULAR
  Filled 2014-08-26: qty 1

## 2014-08-26 MED ORDER — TRAMADOL HCL 50 MG PO TABS: 50.0000 mg | ORAL_TABLET | Freq: Four times a day (QID) | ORAL | Status: DC | PRN
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
## 2014-08-26 NOTE — ED Notes (Signed)
Patient transported to X-ray 
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
## 2014-09-05 ENCOUNTER — Encounter (HOSPITAL_COMMUNITY): Payer: Self-pay | Admitting: Emergency Medicine

## 2014-09-05 ENCOUNTER — Emergency Department (HOSPITAL_COMMUNITY): Payer: BC Managed Care – PPO

## 2014-09-05 ENCOUNTER — Emergency Department (HOSPITAL_COMMUNITY)
Admission: EM | Admit: 2014-09-05 | Discharge: 2014-09-05 | Disposition: A | Discharge status: Home / Self Care | Payer: BC Managed Care – PPO | Attending: Emergency Medicine | Admitting: Emergency Medicine

## 2014-09-05 DIAGNOSIS — Z79899 Other long term (current) drug therapy: Secondary | ICD-10-CM | POA: Insufficient documentation

## 2014-09-05 DIAGNOSIS — E119 Type 2 diabetes mellitus without complications: Secondary | ICD-10-CM | POA: Insufficient documentation

## 2014-09-05 DIAGNOSIS — D649 Anemia, unspecified: Secondary | ICD-10-CM | POA: Insufficient documentation

## 2014-09-05 DIAGNOSIS — E669 Obesity, unspecified: Secondary | ICD-10-CM | POA: Insufficient documentation

## 2014-09-05 DIAGNOSIS — Z043 Encounter for examination and observation following other accident: Secondary | ICD-10-CM | POA: Diagnosis not present

## 2014-09-05 DIAGNOSIS — Z8742 Personal history of other diseases of the female genital tract: Secondary | ICD-10-CM | POA: Insufficient documentation

## 2014-09-05 DIAGNOSIS — Y9389 Activity, other specified: Secondary | ICD-10-CM | POA: Insufficient documentation

## 2014-09-05 DIAGNOSIS — Z87891 Personal history of nicotine dependence: Secondary | ICD-10-CM | POA: Diagnosis not present

## 2014-09-05 DIAGNOSIS — Z7982 Long term (current) use of aspirin: Secondary | ICD-10-CM | POA: Diagnosis not present

## 2014-09-05 DIAGNOSIS — R55 Syncope and collapse: Secondary | ICD-10-CM | POA: Insufficient documentation

## 2014-09-05 DIAGNOSIS — I509 Heart failure, unspecified: Secondary | ICD-10-CM | POA: Insufficient documentation

## 2014-09-05 DIAGNOSIS — Z86718 Personal history of other venous thrombosis and embolism: Secondary | ICD-10-CM | POA: Insufficient documentation

## 2014-09-05 DIAGNOSIS — Y998 Other external cause status: Secondary | ICD-10-CM | POA: Insufficient documentation

## 2014-09-05 DIAGNOSIS — R569 Unspecified convulsions: Secondary | ICD-10-CM | POA: Diagnosis present

## 2014-09-05 DIAGNOSIS — W19XXXA Unspecified fall, initial encounter: Secondary | ICD-10-CM

## 2014-09-05 DIAGNOSIS — Y92240 Courthouse as the place of occurrence of the external cause: Secondary | ICD-10-CM | POA: Diagnosis not present

## 2014-09-05 DIAGNOSIS — I10 Essential (primary) hypertension: Secondary | ICD-10-CM | POA: Diagnosis not present

## 2014-09-05 DIAGNOSIS — W1839XA Other fall on same level, initial encounter: Secondary | ICD-10-CM | POA: Diagnosis not present

## 2014-09-05 LAB — CBC WITH DIFFERENTIAL/PLATELET
Basophils Absolute: 0 10*3/uL (ref 0.0–0.1)
Basophils Relative: 0 % (ref 0–1)
Eosinophils Absolute: 0 10*3/uL (ref 0.0–0.7)
Eosinophils Relative: 1 % (ref 0–5)
HEMATOCRIT: 32.1 % — AB (ref 36.0–46.0)
HEMOGLOBIN: 10.7 g/dL — AB (ref 12.0–15.0)
Lymphocytes Relative: 24 % (ref 12–46)
Lymphs Abs: 0.9 10*3/uL (ref 0.7–4.0)
MCH: 24.4 pg — ABNORMAL LOW (ref 26.0–34.0)
MCHC: 33.3 g/dL (ref 30.0–36.0)
MCV: 73.3 fL — ABNORMAL LOW (ref 78.0–100.0)
MONOS PCT: 8 % (ref 3–12)
Monocytes Absolute: 0.3 10*3/uL (ref 0.1–1.0)
NEUTROS ABS: 2.7 10*3/uL (ref 1.7–7.7)
Neutrophils Relative %: 67 % (ref 43–77)
Platelets: 282 10*3/uL (ref 150–400)
RBC: 4.38 MIL/uL (ref 3.87–5.11)
RDW: 14.2 % (ref 11.5–15.5)
WBC: 3.9 10*3/uL — ABNORMAL LOW (ref 4.0–10.5)

## 2014-09-05 LAB — COMPREHENSIVE METABOLIC PANEL
ALBUMIN: 3.5 g/dL (ref 3.5–5.2)
ALK PHOS: 121 U/L — AB (ref 39–117)
ALT: 14 U/L (ref 0–35)
AST: 15 U/L (ref 0–37)
Anion gap: 14 (ref 5–15)
BILIRUBIN TOTAL: 0.2 mg/dL — AB (ref 0.3–1.2)
BUN: 15 mg/dL (ref 6–23)
CHLORIDE: 102 meq/L (ref 96–112)
CO2: 28 mEq/L (ref 19–32)
Calcium: 9.5 mg/dL (ref 8.4–10.5)
Creatinine, Ser: 0.92 mg/dL (ref 0.50–1.10)
GFR calc Af Amer: 85 mL/min — ABNORMAL LOW (ref >90)
GFR calc non Af Amer: 74 mL/min — ABNORMAL LOW (ref >90)
Glucose, Bld: 153 mg/dL — ABNORMAL HIGH (ref 70–99)
POTASSIUM: 3.5 meq/L — AB (ref 3.7–5.3)
Sodium: 144 mEq/L (ref 137–147)
Total Protein: 7.6 g/dL (ref 6.0–8.3)

## 2014-09-05 LAB — URINALYSIS, ROUTINE W REFLEX MICROSCOPIC
Bilirubin Urine: NEGATIVE
Glucose, UA: NEGATIVE mg/dL
Hgb urine dipstick: NEGATIVE
Ketones, ur: NEGATIVE mg/dL
Leukocytes, UA: NEGATIVE
Nitrite: NEGATIVE
Protein, ur: NEGATIVE mg/dL
Specific Gravity, Urine: 1.012 (ref 1.005–1.030)
Urobilinogen, UA: 0.2 mg/dL (ref 0.0–1.0)
pH: 6.5 (ref 5.0–8.0)

## 2014-09-05 LAB — RAPID URINE DRUG SCREEN, HOSP PERFORMED
AMPHETAMINES: NOT DETECTED
BARBITURATES: NOT DETECTED
Benzodiazepines: NOT DETECTED
Cocaine: NOT DETECTED
Opiates: NOT DETECTED
Tetrahydrocannabinol: NOT DETECTED

## 2014-09-05 MED ORDER — ONDANSETRON HCL 4 MG PO TABS
4.0000 mg | ORAL_TABLET | Freq: Once | ORAL | Status: AC
  Administered 2014-09-05: 4 mg via ORAL
  Filled 2014-09-05: qty 1

## 2014-09-05 MED ORDER — ACETAMINOPHEN 325 MG PO TABS
650.0000 mg | ORAL_TABLET | Freq: Once | ORAL | Status: AC
  Administered 2014-09-05: 650 mg via ORAL
  Filled 2014-09-05: qty 2

## 2014-09-05 MED ORDER — HYDROCODONE-ACETAMINOPHEN 5-325 MG PO TABS
1.0000 | ORAL_TABLET | Freq: Once | ORAL | Status: DC
  Filled 2014-09-05: qty 1

## 2014-09-05 NOTE — ED Notes (Signed)
Patient transported to CT 

## 2014-09-05 NOTE — ED Notes (Signed)
Visitors allowed at bedside with correct password.
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
## 2014-09-05 NOTE — Discharge Instructions (Signed)
Follow-up with primary care physician. Your episodes sound like episodes of syncope (fainting), rather than seizure. However, you are being referred to a neurologist for additional testing to rule out seizure. Return to emergency room with any recurrence prior to your reevaluation.  Syncope Syncope is a medical term for fainting or passing out. This means you lose consciousness and drop to the ground. People are generally unconscious for less than 5 minutes. You may have some muscle twitches for up to 15 seconds before waking up and returning to normal. Syncope occurs more often in older adults, but it can happen to anyone. While most causes of syncope are not dangerous, syncope can be a sign of a serious medical problem. It is important to seek medical care.  CAUSES  Syncope is caused by a sudden drop in blood flow to the brain. The specific cause is often not determined. Factors that can bring on syncope include:  Taking medicines that lower blood pressure.  Sudden changes in posture, such as standing up quickly.  Taking more medicine than prescribed.  Standing in one place for too long.  Seizure disorders.  Dehydration and excessive exposure to heat.  Low blood sugar (hypoglycemia).  Straining to have a bowel movement.  Heart disease, irregular heartbeat, or other circulatory problems.  Fear, emotional distress, seeing blood, or severe pain. SYMPTOMS  Right before fainting, you may:  Feel dizzy or light-headed.  Feel nauseous.  See all white or all black in your field of vision.  Have cold, clammy skin. DIAGNOSIS  Your health care provider will ask about your symptoms, perform a physical exam, and perform an electrocardiogram (ECG) to record the electrical activity of your heart. Your health care provider may also perform other heart or blood tests to determine the cause of your syncope which may include:  Transthoracic echocardiogram (TTE). During echocardiography, sound  waves are used to evaluate how blood flows through your heart.  Transesophageal echocardiogram (TEE).  Cardiac monitoring. This allows your health care provider to monitor your heart rate and rhythm in real time.  Holter monitor. This is a portable device that records your heartbeat and can help diagnose heart arrhythmias. It allows your health care provider to track your heart activity for several days, if needed.  Stress tests by exercise or by giving medicine that makes the heart beat faster. TREATMENT  In most cases, no treatment is needed. Depending on the cause of your syncope, your health care provider may recommend changing or stopping some of your medicines. HOME CARE INSTRUCTIONS  Have someone stay with you until you feel stable.  Do not drive, use machinery, or play sports until your health care provider says it is okay.  Keep all follow-up appointments as directed by your health care provider.  Lie down right away if you start feeling like you might faint. Breathe deeply and steadily. Wait until all the symptoms have passed.  Drink enough fluids to keep your urine clear or pale yellow.  If you are taking blood pressure or heart medicine, get up slowly and take several minutes to sit and then stand. This can reduce dizziness. SEEK IMMEDIATE MEDICAL CARE IF:   You have a severe headache.  You have unusual pain in the chest, abdomen, or back.  You are bleeding from your mouth or rectum, or you have black or tarry stool.  You have an irregular or very fast heartbeat.  You have pain with breathing.  You have repeated fainting or seizure-like jerking during  an episode.  You faint when sitting or lying down.  You have confusion.  You have trouble walking.  You have severe weakness.  You have vision problems. If you fainted, call your local emergency services (911 in [place] Do not drive yourself to the hospital.  MAKE SURE YOU:  Understand these  instructions.  Will watch your condition.  Will get help right away if you are not doing well or get worse. Document Released: 10/05/2005 Document Revised: 10/10/2013 Document Reviewed: 12/04/2011 [facility] Patient Information 2015 [place], [place]. This information is not intended to replace advice given to you by your health care provider. Make sure you discuss any questions you have with your health care provider.  Vasovagal Syncope, Adult Syncope, commonly known as fainting, is a temporary loss of consciousness. It occurs when the blood flow to the brain is reduced. Vasovagal syncope (also called neurocardiogenic syncope) is a fainting spell in which the blood flow to the brain is reduced because of a sudden drop in heart rate and blood pressure. Vasovagal syncope occurs when the brain and the cardiovascular system (blood vessels) do not adequately communicate and respond to each other. This is the most common cause of fainting. It often occurs in response to fear or some other type of emotional or physical stress. The body has a reaction in which the heart starts beating too slowly or the blood vessels expand, reducing blood pressure. This type of fainting spell is generally considered harmless. However, injuries can occur if a person takes a sudden fall during a fainting spell.  CAUSES  Vasovagal syncope occurs when a person's blood pressure and heart rate decrease suddenly, usually in response to a trigger. Many things and situations can trigger an episode. Some of these include:   Pain.   Fear.   The sight of blood or medical procedures, such as blood being drawn from a vein.   Common activities, such as coughing, swallowing, stretching, or going to the bathroom.   Emotional stress.   Prolonged standing, especially in a warm environment.   Lack of sleep or rest.   Prolonged lack of food.   Prolonged lack of fluids.   Recent illness.  The use of certain drugs that  affect blood pressure, such as cocaine, alcohol, marijuana, inhalants, and opiates.  SYMPTOMS  Before the fainting episode, you may:   Feel dizzy or light headed.   Become pale.  Sense that you are going to faint.   Feel like the room is spinning.   Have tunnel vision, only seeing directly in front of you.   Feel sick to your stomach (nauseous).   See spots or slowly lose vision.   Hear ringing in your ears.   Have a headache.   Feel warm and sweaty.   Feel a sensation of pins and needles. During the fainting spell, you will generally be unconscious for no longer than a couple minutes before waking up and returning to normal. If you get up too quickly before your body can recover, you may faint again. Some twitching or jerky movements may occur during the fainting spell.  DIAGNOSIS  Your caregiver will ask about your symptoms, take a medical history, and perform a physical exam. Various tests may be done to rule out other causes of fainting. These may include blood tests and tests to check the heart, such as electrocardiography, echocardiography, and possibly an electrophysiology study. When other causes have been ruled out, a test may be done to check the body's  response to changes in position (tilt table test). TREATMENT  Most cases of vasovagal syncope do not require treatment. Your caregiver may recommend ways to avoid fainting triggers and may provide home strategies for preventing fainting. If you must be exposed to a possible trigger, you can drink additional fluids to help reduce your chances of having an episode of vasovagal syncope. If you have warning signs of an oncoming episode, you can respond by positioning yourself favorably (lying down). If your fainting spells continue, you may be given medicines to prevent fainting. Some medicines may help make you more resistant to repeated episodes of vasovagal syncope. Special exercises or compression stockings may be  recommended. In rare cases, the surgical placement of a pacemaker is considered. HOME CARE INSTRUCTIONS   Learn to identify the warning signs of vasovagal syncope.   Sit or lie down at the first warning sign of a fainting spell. If sitting, put your head down between your legs. If you lie down, swing your legs up in the air to increase blood flow to the brain.   Avoid hot tubs and saunas.  Avoid prolonged standing.  Drink enough fluids to keep your urine clear or pale yellow. Avoid caffeine.  Increase salt in your diet as directed by your caregiver.   If you have to stand for a long time, perform movements such as:   Crossing your legs.   Flexing and stretching your leg muscles.   Squatting.   Moving your legs.   Bending over.   Only take over-the-counter or prescription medicines as directed by your caregiver. Do not suddenly stop any medicines without asking your caregiver first. SEEK MEDICAL CARE IF:   Your fainting spells continue or happen more frequently in spite of treatment.   You lose consciousness for more than a couple minutes.  You have fainting spells during or after exercising or after being startled.   You have new symptoms that occur with the fainting spells, such as:   Shortness of breath.  Chest pain.   Irregular heartbeat.   You have episodes of twitching or jerky movements that last longer than a few seconds.  You have episodes of twitching or jerky movements without obvious fainting. SEEK IMMEDIATE MEDICAL CARE IF:   You have injuries or bleeding after a fainting spell.   You have episodes of twitching or jerky movements that last longer than 5 minutes.   You have more than one spell of twitching or jerky movements before returning to consciousness after fainting. MAKE SURE YOU:   Understand these instructions.  Will watch your condition.  Will get help right away if you are not doing well or get worse. Document  Released: 09/21/2012 Document Reviewed: 09/21/2012 [facility] Patient Information 2015 [place]. This information is not intended to replace advice given to you by your health care provider. Make sure you discuss any questions you have with your health care provider.

## 2014-09-05 NOTE — ED Provider Notes (Signed)
CSN: [id number]     Arrival date & time 09/05/14  1015 History   First MD Initiated Contact with Patient 09/05/14 1022     Chief Complaint  Patient presents with  . Seizures      HPI  [place] No described seizure activity or convulsions. She states she was sitting in a court room. She felt warm and hot and flushed. She started to stand. She felt lightheaded and attempted to sit back down and ended up on the floor. She states she "came to" and it was a few seconds later. Ultimately paramedics state that there was no postictal period but she had some "shaking of her hand".  Not bite her tongue. Was not incontinent. Was awake and alert upon arrival of paramedics and has been since. She states is similar thing has happened to her a few times in the past. Most recently 1 week ago while having a bowel movement. She felt sweaty and lightheaded and ended up on the floor. No prolonged loss of consciousness. No chest pain. No shortness of breath. No dyspnea. Asymptomatic here.  Past Medical History  Diagnosis Date  . HTN (hypertension)   . Fatigue   . CHF (congestive heart failure) 04/2011    EF 15-20% from echo 05/19/11  . DOE (dyspnea on exertion)   . Orthopnea   . DVT (deep venous thrombosis) 06/2011  . Anemia   . Menorrhagia     with iron deficient anemia  . Diabetes mellitus without complication   . Obesity   . Ovarian cyst    Past Surgical History  Procedure Laterality Date  . Tubal ligation  2006  . Cyst removed from ovary    . Coronary stent placement     Family History  Problem Relation Age of Onset  . Hypertension Mother   . Stroke Father    History  Substance Use Topics  . Smoking status: Former Smoker -- 0.50 packs/day for 5 years    Types: Cigarettes    Quit date: 10/19/1993  . Smokeless tobacco: Not on file  . Alcohol Use: No   OB History    No data available     Review of Systems  Constitutional:  Negative for fever, chills, diaphoresis, appetite change and fatigue.  HENT: Negative for mouth sores, sore throat and trouble swallowing.   Eyes: Negative for visual disturbance.  Respiratory: Negative for cough, chest tightness, shortness of breath and wheezing.   Cardiovascular: Negative for chest pain.  Gastrointestinal: Negative for nausea, vomiting, abdominal pain, diarrhea and abdominal distention.  Endocrine: Negative for polydipsia, polyphagia and polyuria.  Genitourinary: Negative for dysuria, frequency and hematuria.  Musculoskeletal: Negative for gait problem.  Skin: Negative for color change, pallor and rash.  Neurological: Positive for syncope. Negative for dizziness, light-headedness and headaches.  Hematological: Does not bruise/bleed easily.  Psychiatric/Behavioral: Negative for behavioral problems and confusion.      Allergies  Detrol  Home Medications   Prior to Admission medications   Medication Sig Start Date End Date Taking? Authorizing Provider  amLODipine (NORVASC) 5 MG tablet Take 5 mg by mouth every evening.    Yes Historical Provider, MD  aspirin 81 MG tablet Take 81 mg by mouth every evening.    Yes Historical Provider, MD  carvedilol (COREG) 6.25 MG tablet Take 6.25 mg by mouth 2 (two) times daily.    Yes Historical Provider, MD  Cholecalciferol (VITAMIN D-3) 1000 UNITS CAPS Take  1 capsule by mouth daily.    Yes Historical Provider, MD  Ferrous Sulfate (IRON) 325 (65 FE) MG TABS Take 1 tablet by mouth 2 (two) times daily.     Yes Historical Provider, MD  furosemide (LASIX) 40 MG tablet Take 40 mg by mouth daily.    Yes Historical Provider, MD  losartan (COZAAR) 25 MG tablet Take 25 mg by mouth daily.   Yes Historical Provider, MD  metFORMIN (GLUCOPHAGE) 500 MG tablet Take 500 mg by mouth daily with breakfast.    Yes Historical Provider, MD  pravastatin (PRAVACHOL) 40 MG tablet Take 40 mg by mouth every evening.    Yes Historical Provider, MD  traMADol  (ULTRAM) 50 MG tablet Take 1 tablet (50 mg total) by mouth every 6 (six) hours as needed. Patient taking differently: Take 50 mg by mouth every 6 (six) hours as needed for moderate pain or severe pain.  08/26/14  Yes Carmin Muskrat, MD  oxyCODONE-acetaminophen (PERCOCET) 5-325 MG per tablet Take 1 tablet by mouth every 4 (four) hours as needed for severe pain. Patient not taking: Reported on 09/05/2014 05/13/14   Tatyana A Kirichenko, PA-C   BP 149/78 mmHg  Pulse 53  Temp(Src) 98.3 F (36.8 C) (Oral)  Resp 10  Ht 5\' 2"  (1.575 m)  Wt 238 lb (107.956 kg)  BMI 43.52 kg/m2  SpO2 99% Physical Exam  Constitutional: She is oriented to person, place, and time. She appears well-developed and well-nourished. No distress.  HENT:  Head: Normocephalic.  Eyes: Conjunctivae are normal. Pupils are equal, round, and reactive to light. No scleral icterus.  Neck: Normal range of motion. Neck supple. No thyromegaly present.  Cardiovascular: Normal rate and regular rhythm.  Exam reveals no gallop and no friction rub.   No murmur heard. Pulmonary/Chest: Effort normal and breath sounds normal. No respiratory distress. She has no wheezes. She has no rales.  Abdominal: Soft. Bowel sounds are normal. She exhibits no distension. There is no tenderness. There is no rebound.  Musculoskeletal: Normal range of motion.  Neurological: She is alert and oriented to person, place, and time.  No weakness to extremities. Normal neurological exam normal strength sensation. Normal gait. Normal cognition. Intact symmetric cranial nerves.  Skin: Skin is warm and dry. No rash noted.  Psychiatric: She has a normal mood and affect. Her behavior is normal.    ED Course  Procedures (including critical care time) Labs Review Labs Reviewed  CBC WITH DIFFERENTIAL - Abnormal; Notable for the following:    WBC 3.9 (*)    Hemoglobin 10.7 (*)    HCT 32.1 (*)    MCV 73.3 (*)    MCH 24.4 (*)    All other components within normal  limits  COMPREHENSIVE METABOLIC PANEL - Abnormal; Notable for the following:    Potassium 3.5 (*)    Glucose, Bld 153 (*)    Alkaline Phosphatase 121 (*)    Total Bilirubin 0.2 (*)    GFR calc non Af Amer 74 (*)    GFR calc Af Amer 85 (*)    All other components within normal limits  URINALYSIS, ROUTINE W REFLEX MICROSCOPIC  URINE RAPID DRUG SCREEN (HOSP PERFORMED)    Imaging Review Ct Head Wo Contrast  09/05/2014   CLINICAL DATA:  Two syncopal episodes on Monday and 1 today.  EXAM: CT HEAD WITHOUT CONTRAST  TECHNIQUE: Contiguous axial images were obtained from the base of the skull through the vertex without intravenous contrast.  COMPARISON:  CT  scan 05/20/2013  FINDINGS: The ventricles are normal in size and configuration. No extra-axial fluid collections are identified. The gray-white differentiation is normal. No CT findings for acute intracranial process such as hemorrhage or infarction. No mass lesions. The brainstem and cerebellum are grossly normal.  The bony structures are intact. There is a small amount of fluid in the left half of the sphenoid sinus. The remaining paranasal sinuses and mastoid air cells are clear. The globes are intact.  IMPRESSION: Normal head CT.   Electronically Signed   By: Kalman Jewels M.D.   On: 09/05/2014 11:51     EKG Interpretation None      MDM   Final diagnoses:  Fall  Vaso vagal episode    Patient's episodes sound like episodes of vasovagal vagal syncope. No postictal period described with either episode. I think she is appropriate for outpatient treatment. However, I have given her [facility] neurology information regarding follow-up. Asked her to follow-up with her primary care physician. I have asked her to return here with recurrence.     Tanna Furry, MD 09/05/14 1255

## 2014-09-27 ENCOUNTER — Encounter (HOSPITAL_COMMUNITY): Payer: Self-pay | Admitting: Cardiology

## 2014-11-01 ENCOUNTER — Emergency Department (HOSPITAL_COMMUNITY): Payer: BLUE CROSS/BLUE SHIELD

## 2014-11-01 ENCOUNTER — Encounter (HOSPITAL_COMMUNITY): Payer: Self-pay | Admitting: *Deleted

## 2014-11-01 ENCOUNTER — Emergency Department (HOSPITAL_COMMUNITY)
Admission: EM | Admit: 2014-11-01 | Discharge: 2014-11-01 | Disposition: A | Discharge status: Home / Self Care | Payer: BLUE CROSS/BLUE SHIELD | Attending: Emergency Medicine | Admitting: Emergency Medicine

## 2014-11-01 DIAGNOSIS — I509 Heart failure, unspecified: Secondary | ICD-10-CM | POA: Insufficient documentation

## 2014-11-01 DIAGNOSIS — A084 Viral intestinal infection, unspecified: Secondary | ICD-10-CM | POA: Insufficient documentation

## 2014-11-01 DIAGNOSIS — Z79899 Other long term (current) drug therapy: Secondary | ICD-10-CM | POA: Insufficient documentation

## 2014-11-01 DIAGNOSIS — J3489 Other specified disorders of nose and nasal sinuses: Secondary | ICD-10-CM | POA: Insufficient documentation

## 2014-11-01 DIAGNOSIS — R5383 Other fatigue: Secondary | ICD-10-CM | POA: Diagnosis not present

## 2014-11-01 DIAGNOSIS — Z7982 Long term (current) use of aspirin: Secondary | ICD-10-CM | POA: Insufficient documentation

## 2014-11-01 DIAGNOSIS — Z9889 Other specified postprocedural states: Secondary | ICD-10-CM | POA: Diagnosis not present

## 2014-11-01 DIAGNOSIS — R079 Chest pain, unspecified: Secondary | ICD-10-CM | POA: Diagnosis present

## 2014-11-01 DIAGNOSIS — Z86718 Personal history of other venous thrombosis and embolism: Secondary | ICD-10-CM | POA: Insufficient documentation

## 2014-11-01 DIAGNOSIS — Z8742 Personal history of other diseases of the female genital tract: Secondary | ICD-10-CM | POA: Insufficient documentation

## 2014-11-01 DIAGNOSIS — K828 Other specified diseases of gallbladder: Secondary | ICD-10-CM

## 2014-11-01 DIAGNOSIS — R52 Pain, unspecified: Secondary | ICD-10-CM

## 2014-11-01 DIAGNOSIS — Z9851 Tubal ligation status: Secondary | ICD-10-CM | POA: Insufficient documentation

## 2014-11-01 DIAGNOSIS — D649 Anemia, unspecified: Secondary | ICD-10-CM | POA: Diagnosis not present

## 2014-11-01 DIAGNOSIS — Z87891 Personal history of nicotine dependence: Secondary | ICD-10-CM | POA: Diagnosis not present

## 2014-11-01 DIAGNOSIS — E119 Type 2 diabetes mellitus without complications: Secondary | ICD-10-CM | POA: Insufficient documentation

## 2014-11-01 DIAGNOSIS — K829 Disease of gallbladder, unspecified: Secondary | ICD-10-CM | POA: Insufficient documentation

## 2014-11-01 DIAGNOSIS — I10 Essential (primary) hypertension: Secondary | ICD-10-CM | POA: Insufficient documentation

## 2014-11-01 LAB — CBC WITH DIFFERENTIAL/PLATELET
BASOS PCT: 0 % (ref 0–1)
Basophils Absolute: 0 10*3/uL (ref 0.0–0.1)
EOS ABS: 0 10*3/uL (ref 0.0–0.7)
EOS PCT: 0 % (ref 0–5)
HCT: 33 % — ABNORMAL LOW (ref 36.0–46.0)
Hemoglobin: 10.9 g/dL — ABNORMAL LOW (ref 12.0–15.0)
Lymphocytes Relative: 7 % — ABNORMAL LOW (ref 12–46)
Lymphs Abs: 0.6 10*3/uL — ABNORMAL LOW (ref 0.7–4.0)
MCH: 24.4 pg — ABNORMAL LOW (ref 26.0–34.0)
MCHC: 33 g/dL (ref 30.0–36.0)
MCV: 73.8 fL — ABNORMAL LOW (ref 78.0–100.0)
MONO ABS: 0.3 10*3/uL (ref 0.1–1.0)
Monocytes Relative: 4 % (ref 3–12)
Neutro Abs: 7.3 10*3/uL (ref 1.7–7.7)
Neutrophils Relative %: 89 % — ABNORMAL HIGH (ref 43–77)
Platelets: 285 10*3/uL (ref 150–400)
RBC: 4.47 MIL/uL (ref 3.87–5.11)
RDW: 14.1 % (ref 11.5–15.5)
WBC: 8.2 10*3/uL (ref 4.0–10.5)

## 2014-11-01 LAB — URINALYSIS, ROUTINE W REFLEX MICROSCOPIC
BILIRUBIN URINE: NEGATIVE
GLUCOSE, UA: NEGATIVE mg/dL
Hgb urine dipstick: NEGATIVE
KETONES UR: NEGATIVE mg/dL
Nitrite: NEGATIVE
PH: 6 (ref 5.0–8.0)
Protein, ur: NEGATIVE mg/dL
Specific Gravity, Urine: 1.016 (ref 1.005–1.030)
Urobilinogen, UA: 0.2 mg/dL (ref 0.0–1.0)

## 2014-11-01 LAB — URINE MICROSCOPIC-ADD ON

## 2014-11-01 LAB — COMPREHENSIVE METABOLIC PANEL
ALBUMIN: 3.5 g/dL (ref 3.5–5.2)
ALK PHOS: 114 U/L (ref 39–117)
ALT: 18 U/L (ref 0–35)
AST: 20 U/L (ref 0–37)
Anion gap: 15 (ref 5–15)
BILIRUBIN TOTAL: 0.4 mg/dL (ref 0.3–1.2)
BUN: 16 mg/dL (ref 6–23)
CO2: 28 mmol/L (ref 19–32)
Calcium: 10 mg/dL (ref 8.4–10.5)
Chloride: 101 mEq/L (ref 96–112)
Creatinine, Ser: 0.88 mg/dL (ref 0.50–1.10)
GFR calc Af Amer: 90 mL/min — ABNORMAL LOW (ref >90)
GFR calc non Af Amer: 78 mL/min — ABNORMAL LOW (ref >90)
GLUCOSE: 188 mg/dL — AB (ref 70–99)
Potassium: 3.8 mmol/L (ref 3.5–5.1)
Sodium: 144 mmol/L (ref 135–145)
Total Protein: 7.4 g/dL (ref 6.0–8.3)

## 2014-11-01 LAB — BRAIN NATRIURETIC PEPTIDE: B NATRIURETIC PEPTIDE 5: 20.3 pg/mL (ref 0.0–100.0)

## 2014-11-01 LAB — I-STAT TROPONIN, ED: TROPONIN I, POC: 0 ng/mL (ref 0.00–0.08)

## 2014-11-01 LAB — LIPASE, BLOOD: Lipase: 28 U/L (ref 11–59)

## 2014-11-01 MED ORDER — ONDANSETRON HCL 4 MG PO TABS: 4.0000 mg | ORAL_TABLET | Freq: Four times a day (QID) | ORAL | Status: DC

## 2014-11-01 MED ORDER — SODIUM CHLORIDE 0.9 % IV BOLUS (SEPSIS)
250.0000 mL | Freq: Once | INTRAVENOUS | Status: AC
  Administered 2014-11-01: 250 mL via INTRAVENOUS

## 2014-11-01 MED ORDER — GI COCKTAIL ~~LOC~~
30.0000 mL | Freq: Once | ORAL | Status: AC
  Administered 2014-11-01: 30 mL via ORAL
  Filled 2014-11-01: qty 30

## 2014-11-01 MED ORDER — ONDANSETRON HCL 4 MG/2ML IJ SOLN
4.0000 mg | Freq: Once | INTRAMUSCULAR | Status: AC
Start: 2014-11-01 — End: 2014-11-01
  Administered 2014-11-01: 4 mg via INTRAVENOUS
  Filled 2014-11-01: qty 2

## 2014-11-01 MED ORDER — OMEPRAZOLE 20 MG PO CPDR: 20.0000 mg | DELAYED_RELEASE_CAPSULE | Freq: Every day | ORAL | Status: DC

## 2014-11-01 MED ORDER — DICYCLOMINE HCL 20 MG PO TABS: 20.0000 mg | ORAL_TABLET | Freq: Two times a day (BID) | ORAL | Status: DC

## 2014-11-01 MED ORDER — FENTANYL CITRATE 0.05 MG/ML IJ SOLN
100.0000 ug | Freq: Once | INTRAMUSCULAR | Status: AC
  Administered 2014-11-01: 100 ug via INTRAVENOUS
  Filled 2014-11-01: qty 2

## 2014-11-01 NOTE — ED Notes (Signed)
Patient ambulating to restroom before leaving.  Patient tolerated well.

## 2014-11-01 NOTE — ED Provider Notes (Signed)
CSN: [id number]     Arrival date & time 11/01/14  0441 History   First MD Initiated Contact with Patient 11/01/14 [phone]     Chief Complaint  Patient presents with  . Chest Pain   HPI   Patient is a 49 year old female with past medical history of hypertension, congestive heart failure, diabetes, and morbid obesity who presents emergency room for evaluation of abdominal pain and nausea. Patient states that she woke up at approximately 3:30 this morning with pain in her stomach which radiated to her chest. She described the pain as a bad cramping pain that was associated with some nausea and the feeling that she needed to have a bowel movement. Patient states that she did have a bowel movement that was slightly looser than normal. Patient has continued to have constant pain since that time. Patient states that she has had similar pain to this in the past in November but was not evaluated at that time. Patient states that currently her pain is a 4 out of 10. She continues to complain of some nausea but has had no vomiting. She has had 2 episodes diarrhea here in the emergency room. She states that she has never had surgery on her abdomen other than for tubal ligation and cyst removal of her ovary in 2006. She does still have her gallbladder. She denies melena, hematochezia, urinary pain, urinary frequency, and urinary urgency. She denies vaginal symptoms. She does admit to some congestion with started at the same time. Patient points to the epigastric area and right upper quadrant when asked he did locate her pain. Patient does state that she has no history of acid reflux. She does take aspirin and mobile daily for pain and arthritis. Patient denies history of stomach ulcers.  Past Medical History  Diagnosis Date  . HTN (hypertension)   . Fatigue   . CHF (congestive heart failure) 04/2011    EF 15-20% from echo 05/19/11  . DOE (dyspnea on exertion)   . Orthopnea   . DVT (deep venous thrombosis) 06/2011  .  Anemia   . Menorrhagia     with iron deficient anemia  . Diabetes mellitus without complication   . Obesity   . Ovarian cyst    Past Surgical History  Procedure Laterality Date  . Tubal ligation  2006  . Cyst removed from ovary    . Coronary stent placement    . Left heart catheterization with coronary angiogram N/A 11/17/2011    Procedure: LEFT HEART CATHETERIZATION WITH CORONARY ANGIOGRAM;  Surgeon: Laverda Page, MD;  Location: [facility] CATH LAB;  Service: Cardiovascular;  Laterality: N/A;   Family History  Problem Relation Age of Onset  . Hypertension Mother   . Stroke Father    History  Substance Use Topics  . Smoking status: Former Smoker -- 0.50 packs/day for 5 years    Types: Cigarettes    Quit date: 10/19/1993  . Smokeless tobacco: Never Used  . Alcohol Use: No   OB History    No data available     Review of Systems  Constitutional: Positive for fatigue. Negative for fever and chills.  HENT: Positive for congestion and rhinorrhea.   Respiratory: Negative for cough, chest tightness and shortness of breath.   Gastrointestinal: Positive for nausea, abdominal pain and diarrhea. Negative for vomiting, constipation, blood in stool, abdominal distention and anal bleeding.  Genitourinary: Negative for dysuria, urgency, frequency, hematuria and difficulty urinating.  All other systems reviewed and are negative.  Allergies  Detrol  Home Medications   Prior to Admission medications   Medication Sig Start Date End Date Taking? Authorizing Provider  amLODipine (NORVASC) 5 MG tablet Take 5 mg by mouth every evening.    Yes Historical Provider, MD  aspirin 81 MG tablet Take 81 mg by mouth every evening.    Yes Historical Provider, MD  carvedilol (COREG) 6.25 MG tablet Take 6.25 mg by mouth 2 (two) times daily.    Yes Historical Provider, MD  Cholecalciferol (VITAMIN D-3) 1000 UNITS CAPS Take 1 capsule by mouth daily.    Yes Historical Provider, MD  Ferrous Sulfate  (IRON) 325 (65 FE) MG TABS Take 1 tablet by mouth 2 (two) times daily.     Yes Historical Provider, MD  furosemide (LASIX) 40 MG tablet Take 40 mg by mouth daily.    Yes Historical Provider, MD  losartan (COZAAR) 25 MG tablet Take 25 mg by mouth daily.   Yes Historical Provider, MD  metFORMIN (GLUCOPHAGE) 500 MG tablet Take 500 mg by mouth daily with breakfast.    Yes Historical Provider, MD  pravastatin (PRAVACHOL) 40 MG tablet Take 40 mg by mouth every evening.    Yes Historical Provider, MD  dicyclomine (BENTYL) 20 MG tablet Take 1 tablet (20 mg total) by mouth 2 (two) times daily. 11/01/14   Luster Hechler A Forcucci, PA-C  omeprazole (PRILOSEC) 20 MG capsule Take 1 capsule (20 mg total) by mouth daily. 11/01/14   Shaydon Lease A Forcucci, PA-C  ondansetron (ZOFRAN) 4 MG tablet Take 1 tablet (4 mg total) by mouth every 6 (six) hours. 11/01/14   Kyandra Mcclaine A Forcucci, PA-C  oxyCODONE-acetaminophen (PERCOCET) 5-325 MG per tablet Take 1 tablet by mouth every 4 (four) hours as needed for severe pain. Patient not taking: Reported on 09/05/2014 05/13/14   Tatyana A Kirichenko, PA-C  traMADol (ULTRAM) 50 MG tablet Take 1 tablet (50 mg total) by mouth every 6 (six) hours as needed. Patient taking differently: Take 50 mg by mouth every 6 (six) hours as needed for moderate pain or severe pain.  08/26/14   Carmin Muskrat, MD   BP 104/55 mmHg  Pulse 54  Temp(Src) 97.7 F (36.5 C) (Oral)  Resp 10  SpO2 98% Physical Exam  Constitutional: She is oriented to person, place, and time. She appears well-developed and well-nourished. No distress.  HENT:  Head: Normocephalic and atraumatic.  Mouth/Throat: Oropharynx is clear and moist. No oropharyngeal exudate.  Eyes: Conjunctivae and EOM are normal. Pupils are equal, round, and reactive to light. No scleral icterus.  Neck: Normal range of motion. Neck supple. No JVD present. No thyromegaly present.  Cardiovascular: Normal rate, regular rhythm and intact distal pulses.  Exam  reveals no gallop and no friction rub.   No murmur heard. Pulmonary/Chest: Effort normal and breath sounds normal. No respiratory distress. She has no wheezes. She has no rales. She exhibits no tenderness.  Abdominal: Soft. Normal appearance and bowel sounds are normal. She exhibits no distension and no mass. There is tenderness in the right upper quadrant and epigastric area. There is no rigidity, no rebound, no guarding, no CVA tenderness, no tenderness at McBurney's point and negative Murphy's sign.  Musculoskeletal: Normal range of motion.  Lymphadenopathy:    She has no cervical adenopathy.  Neurological: She is alert and oriented to person, place, and time.  Skin: Skin is warm and dry. She is not diaphoretic.  Psychiatric: She has a normal mood and affect. Her behavior is normal. Judgment and  thought content normal.  Nursing note and vitals reviewed.   ED Course  Procedures (including critical care time) Labs Review Labs Reviewed  CBC WITH DIFFERENTIAL - Abnormal; Notable for the following:    Hemoglobin 10.9 (*)    HCT 33.0 (*)    MCV 73.8 (*)    MCH 24.4 (*)    Neutrophils Relative % 89 (*)    Lymphocytes Relative 7 (*)    Lymphs Abs 0.6 (*)    All other components within normal limits  COMPREHENSIVE METABOLIC PANEL - Abnormal; Notable for the following:    Glucose, Bld 188 (*)    GFR calc non Af Amer 78 (*)    GFR calc Af Amer 90 (*)    All other components within normal limits  URINALYSIS, ROUTINE W REFLEX MICROSCOPIC - Abnormal; Notable for the following:    Leukocytes, UA SMALL (*)    All other components within normal limits  URINE MICROSCOPIC-ADD ON - Abnormal; Notable for the following:    Squamous Epithelial / LPF FEW (*)    All other components within normal limits  LIPASE, BLOOD  BRAIN NATRIURETIC PEPTIDE  I-STAT TROPOININ, ED    Imaging Review Dg Chest 2 View  11/01/2014   CLINICAL DATA:  Acute onset of chest pain earlier today. Current history of  hypertension and coronary artery disease.  EXAM: CHEST  2 VIEW  COMPARISON:  One view chest x-ray 08/26/2014. Two-view chest x-ray 02/03/2012, 08/09/2009.  FINDINGS: Cardiomediastinal silhouette unremarkable, unchanged. Lungs clear. Bronchovascular markings normal. Pulmonary vascularity normal. No visible pleural effusions. No pneumothorax. Minimal degenerative changes involving the thoracic spine. No significant interval change.  IMPRESSION: No acute cardiopulmonary disease.  Stable examination.   Electronically Signed   By: Evangeline Dakin M.D.   On: 11/01/2014 08:56   US Abdomen Limited Ruq  11/01/2014   CLINICAL DATA:  Abdominal pain.  EXAM: US ABDOMEN LIMITED - RIGHT UPPER QUADRANT  COMPARISON:  None.  FINDINGS: Gallbladder:  Gallbladder sludge is present without focal stones. There is no wall thickening. There is no sonographic Murphy sign.  Common bile duct:  Diameter: 2.8 mm, within normal limits.  Liver:  Diffuse fatty infiltration of the liver is evident. No focal lesions are present.  IMPRESSION: 1. A small amount of layering sludge is present in the gallbladder without evidence for acute cholecystitis or cholelithiasis. 2. Diffuse fatty infiltration of the liver without a discrete lesion.   Electronically Signed   By: Lawrence [name] M.D.   On: 11/01/2014 09:48     EKG Interpretation   Date/Time:  Thursday November 01 2014 04:53:00 EST Ventricular Rate:  63 PR Interval:  171 QRS Duration: 94 QT Interval:  407 QTC Calculation: 417 R Axis:   39 Text Interpretation:  Sinus rhythm Low voltage, precordial leads No  significant change since last tracing Confirmed by OTTER  MD, OLGA ([id number])  on 11/01/2014 5:14:33 AM      MDM   Final diagnoses:  Chest pain  Pain  Gallbladder sludge  Viral gastroenteritis   Patient is a 49 year old female with past medical history of hypertension, congestive heart failure, diabetes, and morbid obesity who presents to the emergency room for evaluation  of abdominal pain and nausea. Physical exam reveals tenderness to the epigastrium and right upper quadrant with no evidence for peritoneal signs, guarding, or rigidity. EKG is unchanged from prior. I-STAT troponin is negative. Chest x-ray is unremarkable. CBC reveals mild leukocytosis. CMP is unremarkable. Lipase is negative. BNP is negative.  Urine is currently pending. Given body habitus and tenderness in the right upper quadrant ultrasound was performed. Patient does appear to have some sludge in the gallbladder but no acute cholecystitis or cholelithiasis. Patient likely has a mild fatty liver. Patient given GI cocktail, Zofran, and 250 ML's of normal saline with some improvement. On recheck after ultrasound patient was mildly uncomfortable appearing and stated her pain was slightly worse. Patient given 100 mics of fentanyl. Differential includes viral gastrointestinal illness, peptic ulcer disease, GERD, gastritis, and possibly irritation of the gallbladder due to sludge. We'll treat symptomatically with omeprazole, Zofran, and Bentyl. We'll send home a short course of hydrocodone. Patient return for intractable nausea and vomiting, intractable pain in the abdomen, fevers that do not respond to medications, or signs of dehydration which we have discussed. Patient states understanding and agreement with the above plan. Patient discussed with Dr. Kathrynn Humble who agrees with the above workup and plan.  UA is unclear for infection vs. Contamination.  Will send off for a culture given patient is not currently experiencing symptoms.  Will treat based on culture.    Cherylann Parr, PA-C 11/01/14 [place], MD 11/02/14 [phone]

## 2014-11-01 NOTE — ED Notes (Signed)
Patient aware that we need a urine sample.   Patient has been unable to provide.

## 2014-11-01 NOTE — Discharge Instructions (Signed)
Cholelithiasis Cholelithiasis (also called gallstones) is a form of gallbladder disease in which gallstones form in your gallbladder. The gallbladder is an organ that stores bile made in the liver, which helps digest fats. Gallstones begin as small crystals and slowly grow into stones. Gallstone pain occurs when the gallbladder spasms and a gallstone is blocking the duct. Pain can also occur when a stone passes out of the duct.  RISK FACTORS  Being female.   Having multiple pregnancies. Health care providers sometimes advise removing diseased gallbladders before future pregnancies.   Being obese.  Eating a diet heavy in fried foods and fat.   Being older than 52 years and increasing age.   Prolonged use of medicines containing female hormones.   Having diabetes mellitus.   Rapidly losing weight.   Having a family history of gallstones (heredity).  SYMPTOMS  Nausea.   Vomiting.  Abdominal pain.   Yellowing of the skin (jaundice).   Sudden pain. It may persist from several minutes to several hours.  Fever.   Tenderness to the touch. In some cases, when gallstones do not move into the bile duct, people have no pain or symptoms. These are called "silent" gallstones.  TREATMENT Silent gallstones do not need treatment. In severe cases, emergency surgery may be required. Options for treatment include:  Surgery to remove the gallbladder. This is the most common treatment.  Medicines. These do not always work and may take 6-12 months or more to work.  Shock wave treatment (extracorporeal biliary lithotripsy). In this treatment an ultrasound machine sends shock waves to the gallbladder to break gallstones into smaller pieces that can pass into the intestines or be dissolved by medicine. HOME CARE INSTRUCTIONS   Only take over-the-counter or prescription medicines for pain, discomfort, or fever as directed by your health care provider.   Follow a low-fat diet until  seen again by your health care provider. Fat causes the gallbladder to contract, which can result in pain.   Follow up with your health care provider as directed. Attacks are almost always recurrent and surgery is usually required for permanent treatment.  SEEK IMMEDIATE MEDICAL CARE IF:   Your pain increases and is not controlled by medicines.   You have a fever or persistent symptoms for more than 2-3 days.   You have a fever and your symptoms suddenly get worse.   You have persistent nausea and vomiting.  MAKE SURE YOU:   Understand these instructions.  Will watch your condition.  Will get help right away if you are not doing well or get worse. Document Released: 10/01/2005 Document Revised: 06/07/2013 Document Reviewed: 03/29/2013 [facility] Patient Information 2015 [place], [place]. This information is not intended to replace advice given to you by your health care provider. Make sure you discuss any questions you have with your health care provider.  Viral Gastroenteritis Viral gastroenteritis is also known as stomach flu. This condition affects the stomach and intestinal tract. It can cause sudden diarrhea and vomiting. The illness typically lasts 3 to 8 days. Most people develop an immune response that eventually gets rid of the virus. While this natural response develops, the virus can make you quite ill. CAUSES  Many different viruses can cause gastroenteritis, such as rotavirus or noroviruses. You can catch one of these viruses by consuming contaminated food or water. You may also catch a virus by sharing utensils or other personal items with an infected person or by touching a contaminated surface. SYMPTOMS  The most common symptoms  are diarrhea and vomiting. These problems can cause a severe loss of body fluids (dehydration) and a body salt (electrolyte) imbalance. Other symptoms may include:  Fever.  Headache.  Fatigue.  Abdominal pain. DIAGNOSIS  Your caregiver  can usually diagnose viral gastroenteritis based on your symptoms and a physical exam. A stool sample may also be taken to test for the presence of viruses or other infections. TREATMENT  This illness typically goes away on its own. Treatments are aimed at rehydration. The most serious cases of viral gastroenteritis involve vomiting so severely that you are not able to keep fluids down. In these cases, fluids must be given through an intravenous line (IV). HOME CARE INSTRUCTIONS   Drink enough fluids to keep your urine clear or pale yellow. Drink small amounts of fluids frequently and increase the amounts as tolerated.  Ask your caregiver for specific rehydration instructions.  Avoid:  Foods high in sugar.  Alcohol.  Carbonated drinks.  Tobacco.  Juice.  Caffeine drinks.  Extremely hot or cold fluids.  Fatty, greasy foods.  Too much intake of anything at one time.  Dairy products until 24 to 48 hours after diarrhea stops.  You may consume probiotics. Probiotics are active cultures of beneficial bacteria. They may lessen the amount and number of diarrheal stools in adults. Probiotics can be found in yogurt with active cultures and in supplements.  Wash your hands well to avoid spreading the virus.  Only take over-the-counter or prescription medicines for pain, discomfort, or fever as directed by your caregiver. Do not give aspirin to children. Antidiarrheal medicines are not recommended.  Ask your caregiver if you should continue to take your regular prescribed and over-the-counter medicines.  Keep all follow-up appointments as directed by your caregiver. SEEK IMMEDIATE MEDICAL CARE IF:   You are unable to keep fluids down.  You do not urinate at least once every 6 to 8 hours.  You develop shortness of breath.  You notice blood in your stool or vomit. This may look like coffee grounds.  You have abdominal pain that increases or is concentrated in one small area  (localized).  You have persistent vomiting or diarrhea.  You have a fever.  The patient is a child younger than 3 months, and he or she has a fever.  The patient is a child older than 3 months, and he or she has a fever and persistent symptoms.  The patient is a child older than 3 months, and he or she has a fever and symptoms suddenly get worse.  The patient is a baby, and he or she has no tears when crying. MAKE SURE YOU:   Understand these instructions.  Will watch your condition.  Will get help right away if you are not doing well or get worse. Document Released: 10/05/2005 Document Revised: 12/28/2011 Document Reviewed: 07/22/2011 [facility] Patient Information 2015 [place], [place]. This information is not intended to replace advice given to you by your health care provider. Make sure you discuss any questions you have with your health care provider.

## 2014-11-01 NOTE — ED Notes (Signed)
Pt. Woke up with 10/10 CP. Pt. States it felt like before when i had a heart attack. Pt. Is c/o of weakness and nausea.

## 2014-11-03 LAB — URINE CULTURE
Colony Count: NO GROWTH
Culture: NO GROWTH
Special Requests: NORMAL

## 2015-09-29 IMAGING — CT CT HEAD W/O CM
2 series · 16 of 30 positions shown, 18 images · non-contrast
Comparison: CT scan 05/20/2013

CLINICAL DATA: Two syncopal episodes on [REDACTED] and 1 today.

EXAM:
CT HEAD WITHOUT CONTRAST
TECHNIQUE: Contiguous axial images were obtained from the base of the skull
through the vertex without intravenous contrast.

[Series 201: head w/o, idose (1) · axial · non-contrast · 0.42mm/px · z∈[+1114,+1229]mm · 8 of 31 slices shown, 10 images]
[im 4/31  brain]
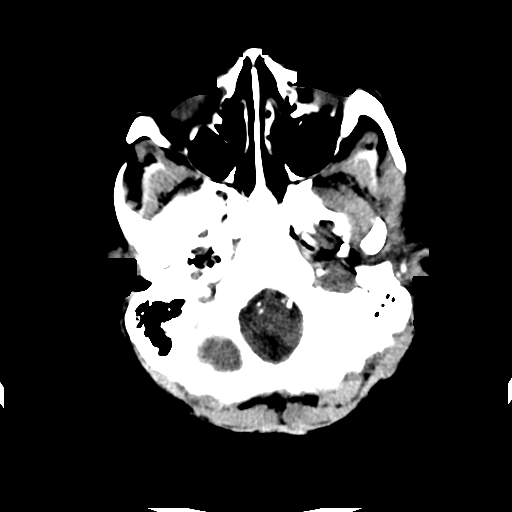
[im 4/31  bone]
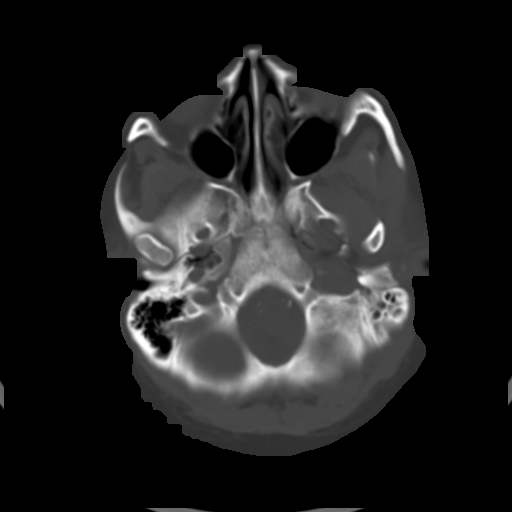
[im 7/31  brain]
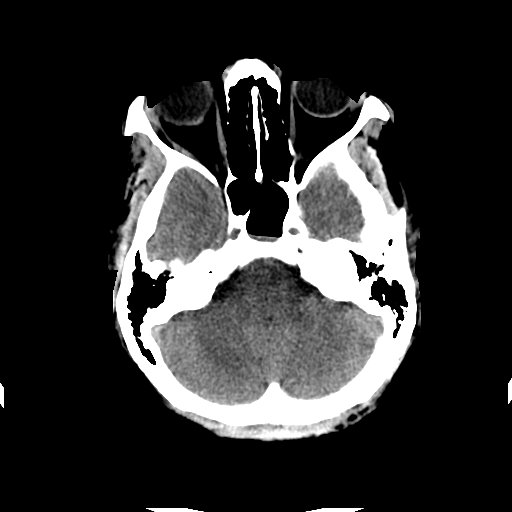
[im 11/31  brain]
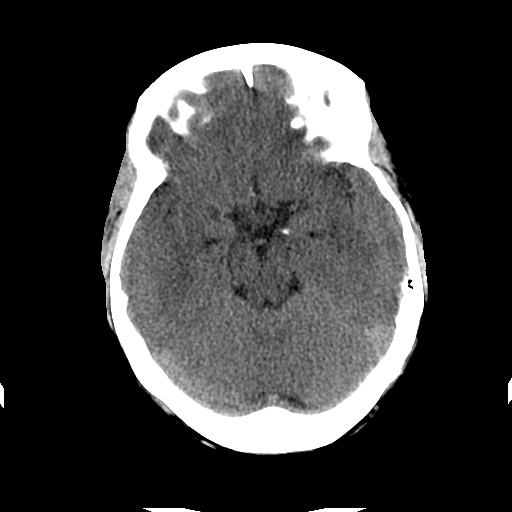
[im 14/31  brain]
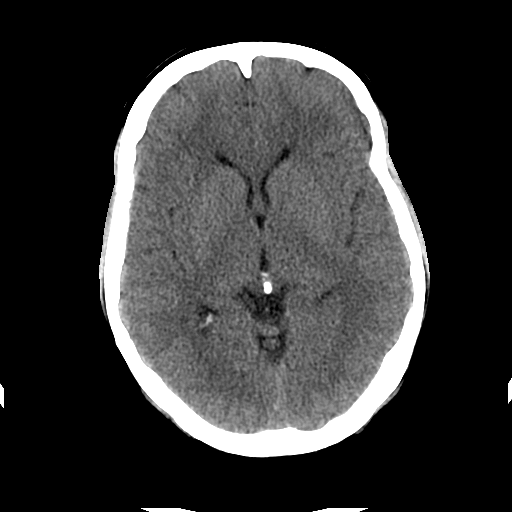
[im 17/31  brain]
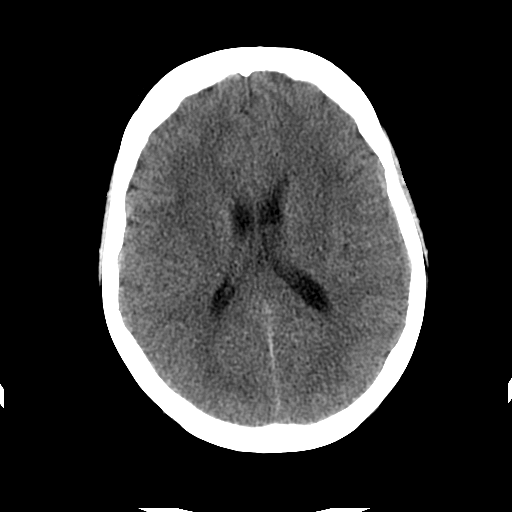
[im 17/31  bone]
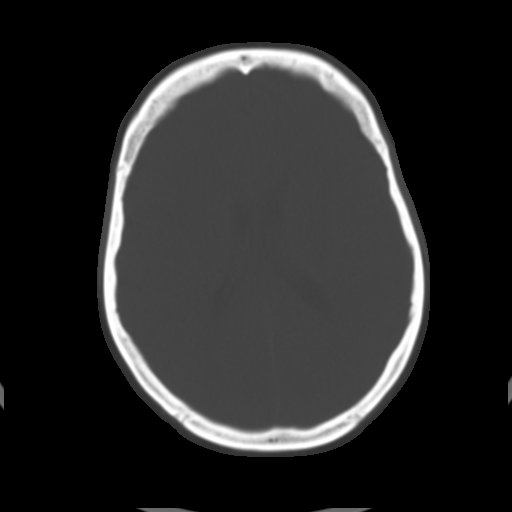
[im 21/31  brain]
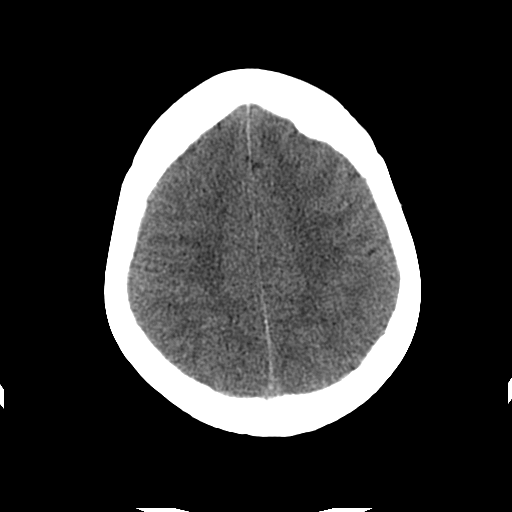
[im 24/31  brain]
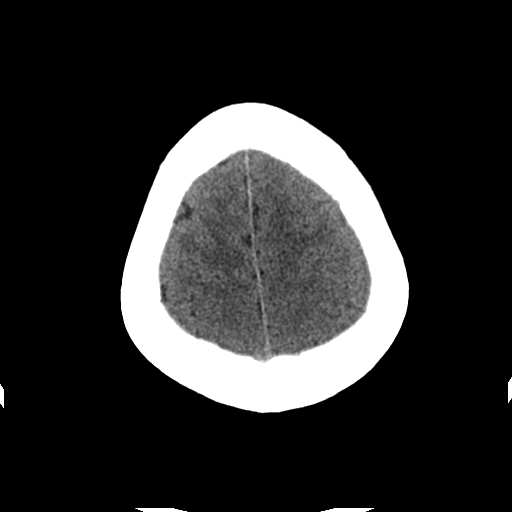
[im 27/31  brain]
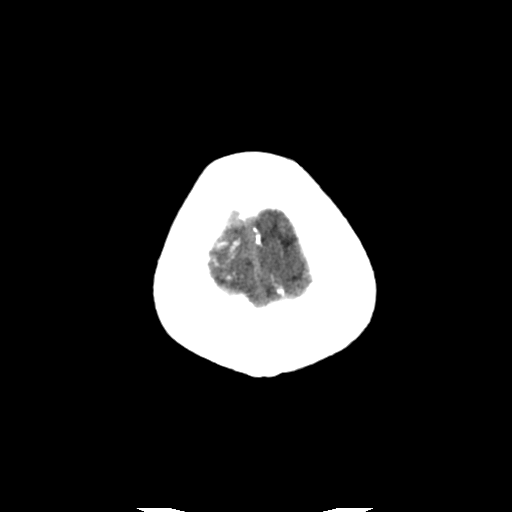

[Series 202: head w/o bone, idose (1) · axial · non-contrast · 0.42mm/px · z∈[+1113,+1233]mm · 8 of 62 slices shown]
[im 7/62  bone]
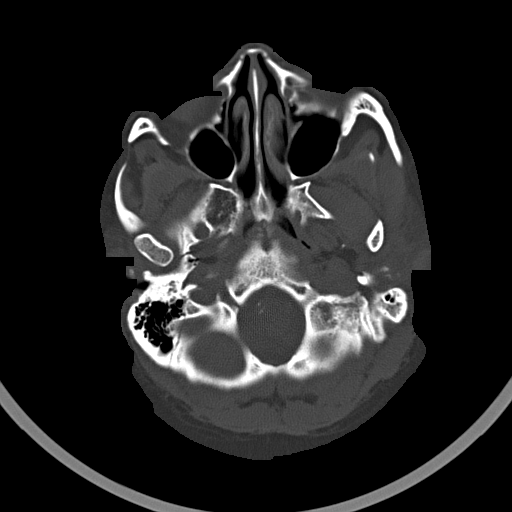
[im 13/62  bone]
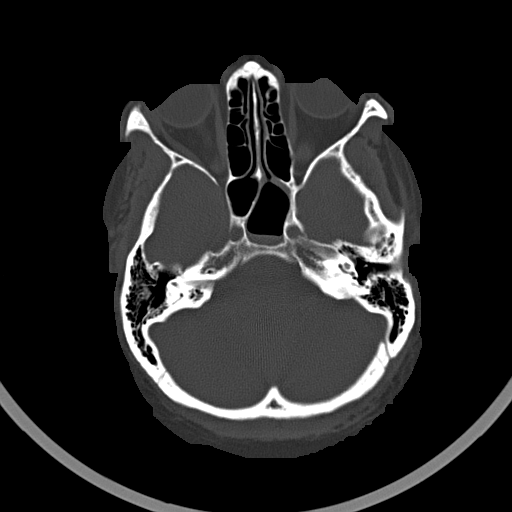
[im 20/62  bone]
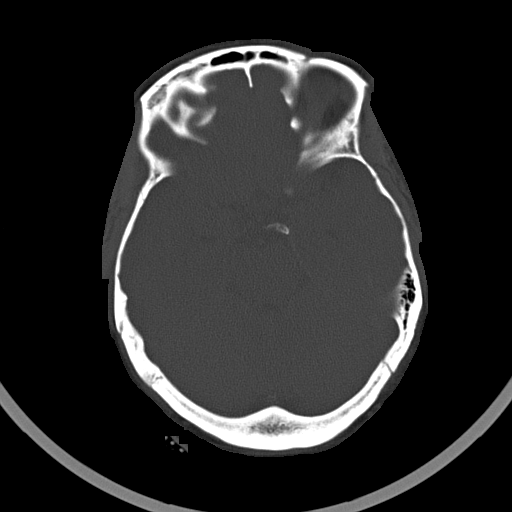
[im 26/62  bone]
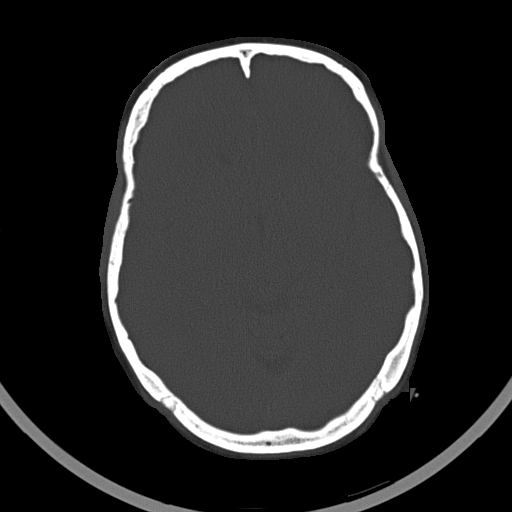
[im 36/62  bone]
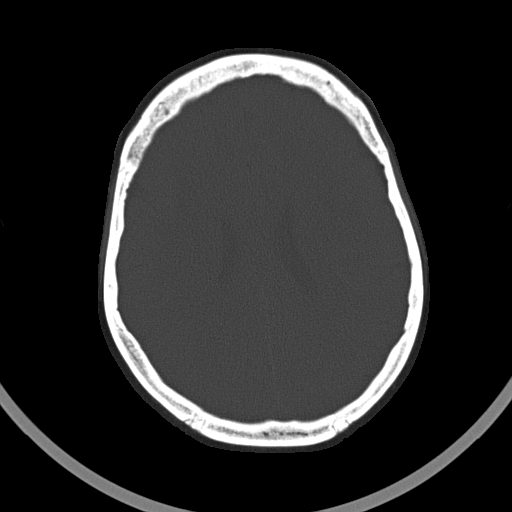
[im 42/62  bone]
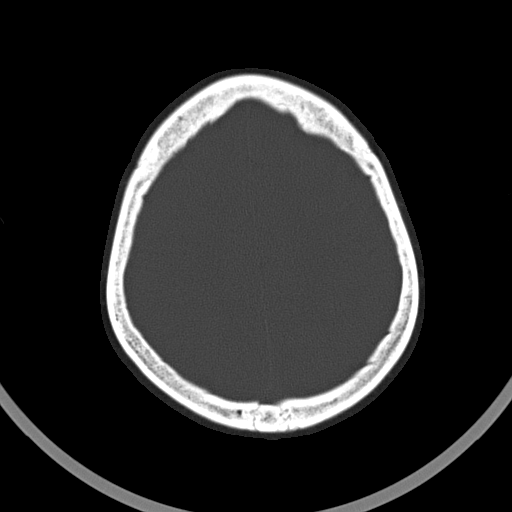
[im 49/62  bone]
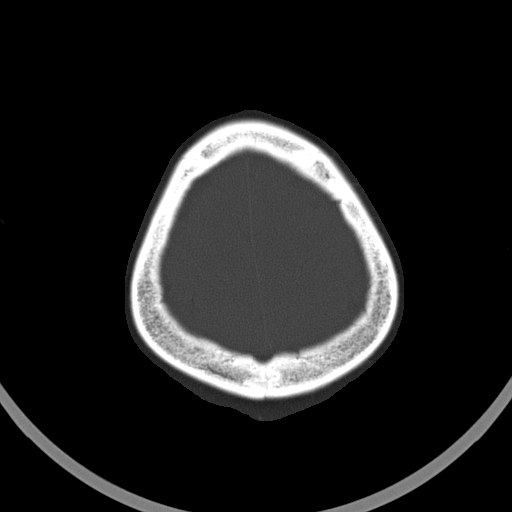
[im 55/62  bone]
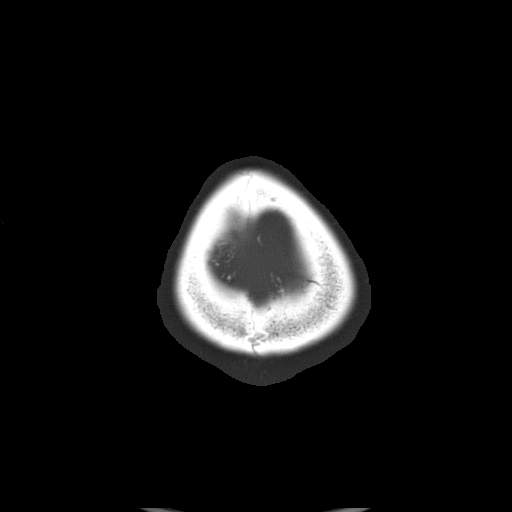

[16 of 30 positions shown; findings below may reference images not displayed]

FINDINGS: The ventricles are normal in size and configuration. No extra-axial
fluid collections are identified. The gray-white differentiation is
normal. No CT findings for acute intracranial process such as
hemorrhage or infarction. No mass lesions. The brainstem and
cerebellum are grossly normal.

The bony structures are intact. There is a small amount of fluid in
the left half of the sphenoid sinus. The remaining paranasal sinuses
and mastoid air cells are clear. The globes are intact.
IMPRESSION: Normal head CT.

## 2015-11-04 ENCOUNTER — Emergency Department (HOSPITAL_COMMUNITY): Payer: No Typology Code available for payment source

## 2015-11-04 ENCOUNTER — Encounter (HOSPITAL_COMMUNITY): Payer: Self-pay | Admitting: *Deleted

## 2015-11-04 ENCOUNTER — Emergency Department (HOSPITAL_COMMUNITY)
Admission: EM | Admit: 2015-11-04 | Discharge: 2015-11-04 | Disposition: A | Discharge status: Home / Self Care | Payer: No Typology Code available for payment source | Attending: Emergency Medicine | Admitting: Emergency Medicine

## 2015-11-04 DIAGNOSIS — R197 Diarrhea, unspecified: Secondary | ICD-10-CM | POA: Insufficient documentation

## 2015-11-04 DIAGNOSIS — E119 Type 2 diabetes mellitus without complications: Secondary | ICD-10-CM | POA: Insufficient documentation

## 2015-11-04 DIAGNOSIS — S3991XA Unspecified injury of abdomen, initial encounter: Secondary | ICD-10-CM | POA: Diagnosis not present

## 2015-11-04 DIAGNOSIS — I10 Essential (primary) hypertension: Secondary | ICD-10-CM | POA: Insufficient documentation

## 2015-11-04 DIAGNOSIS — Z79899 Other long term (current) drug therapy: Secondary | ICD-10-CM | POA: Diagnosis not present

## 2015-11-04 DIAGNOSIS — I509 Heart failure, unspecified: Secondary | ICD-10-CM | POA: Diagnosis not present

## 2015-11-04 DIAGNOSIS — S3992XA Unspecified injury of lower back, initial encounter: Secondary | ICD-10-CM | POA: Diagnosis present

## 2015-11-04 DIAGNOSIS — E669 Obesity, unspecified: Secondary | ICD-10-CM | POA: Insufficient documentation

## 2015-11-04 DIAGNOSIS — D649 Anemia, unspecified: Secondary | ICD-10-CM | POA: Diagnosis not present

## 2015-11-04 DIAGNOSIS — Z8742 Personal history of other diseases of the female genital tract: Secondary | ICD-10-CM | POA: Diagnosis not present

## 2015-11-04 DIAGNOSIS — Z3202 Encounter for pregnancy test, result negative: Secondary | ICD-10-CM | POA: Diagnosis not present

## 2015-11-04 DIAGNOSIS — R109 Unspecified abdominal pain: Secondary | ICD-10-CM

## 2015-11-04 DIAGNOSIS — Z9861 Coronary angioplasty status: Secondary | ICD-10-CM | POA: Diagnosis not present

## 2015-11-04 DIAGNOSIS — Z87891 Personal history of nicotine dependence: Secondary | ICD-10-CM | POA: Diagnosis not present

## 2015-11-04 DIAGNOSIS — Y92481 Parking lot as the place of occurrence of the external cause: Secondary | ICD-10-CM | POA: Diagnosis not present

## 2015-11-04 DIAGNOSIS — M549 Dorsalgia, unspecified: Secondary | ICD-10-CM

## 2015-11-04 DIAGNOSIS — Y998 Other external cause status: Secondary | ICD-10-CM | POA: Diagnosis not present

## 2015-11-04 DIAGNOSIS — Z7982 Long term (current) use of aspirin: Secondary | ICD-10-CM | POA: Insufficient documentation

## 2015-11-04 DIAGNOSIS — Y9389 Activity, other specified: Secondary | ICD-10-CM | POA: Insufficient documentation

## 2015-11-04 DIAGNOSIS — Z86718 Personal history of other venous thrombosis and embolism: Secondary | ICD-10-CM | POA: Insufficient documentation

## 2015-11-04 LAB — CBC WITH DIFFERENTIAL/PLATELET
BASOS PCT: 0 %
Basophils Absolute: 0 10*3/uL (ref 0.0–0.1)
EOS ABS: 0.1 10*3/uL (ref 0.0–0.7)
EOS PCT: 2 %
HCT: 30.6 % — ABNORMAL LOW (ref 36.0–46.0)
Hemoglobin: 10.4 g/dL — ABNORMAL LOW (ref 12.0–15.0)
LYMPHS ABS: 1.4 10*3/uL (ref 0.7–4.0)
Lymphocytes Relative: 24 %
MCH: 24.6 pg — AB (ref 26.0–34.0)
MCHC: 34 g/dL (ref 30.0–36.0)
MCV: 72.5 fL — AB (ref 78.0–100.0)
MONO ABS: 0.4 10*3/uL (ref 0.1–1.0)
Monocytes Relative: 7 %
NEUTROS ABS: 4 10*3/uL (ref 1.7–7.7)
Neutrophils Relative %: 67 %
PLATELETS: ADEQUATE 10*3/uL (ref 150–400)
RBC: 4.22 MIL/uL (ref 3.87–5.11)
RDW: 13.5 % (ref 11.5–15.5)
WBC: 5.9 10*3/uL (ref 4.0–10.5)

## 2015-11-04 LAB — BASIC METABOLIC PANEL
ANION GAP: 8 (ref 5–15)
BUN: 11 mg/dL (ref 6–20)
CALCIUM: 9.1 mg/dL (ref 8.9–10.3)
CO2: 24 mmol/L (ref 22–32)
CREATININE: 0.94 mg/dL (ref 0.44–1.00)
Chloride: 108 mmol/L (ref 101–111)
Glucose, Bld: 154 mg/dL — ABNORMAL HIGH (ref 65–99)
Potassium: 4.3 mmol/L (ref 3.5–5.1)
SODIUM: 140 mmol/L (ref 135–145)

## 2015-11-04 LAB — URINALYSIS, ROUTINE W REFLEX MICROSCOPIC
BILIRUBIN URINE: NEGATIVE
Glucose, UA: NEGATIVE mg/dL
HGB URINE DIPSTICK: NEGATIVE
KETONES UR: NEGATIVE mg/dL
Leukocytes, UA: NEGATIVE
NITRITE: NEGATIVE
PROTEIN: NEGATIVE mg/dL
SPECIFIC GRAVITY, URINE: 1.012 (ref 1.005–1.030)
pH: 6 (ref 5.0–8.0)

## 2015-11-04 LAB — POC URINE PREG, ED: PREG TEST UR: NEGATIVE

## 2015-11-04 MED ORDER — ACETAMINOPHEN 325 MG PO TABS
650.0000 mg | ORAL_TABLET | Freq: Once | ORAL | Status: AC
  Administered 2015-11-04: 650 mg via ORAL
  Filled 2015-11-04: qty 2

## 2015-11-04 MED ORDER — IOHEXOL 300 MG/ML  SOLN
100.0000 mL | Freq: Once | INTRAMUSCULAR | Status: AC | PRN
  Administered 2015-11-04: 100 mL via INTRAVENOUS

## 2015-11-04 MED ORDER — IBUPROFEN 800 MG PO TABS: 800.0000 mg | ORAL_TABLET | Freq: Three times a day (TID) | ORAL | Status: DC

## 2015-11-04 MED ORDER — METHOCARBAMOL 500 MG PO TABS: 500.0000 mg | ORAL_TABLET | Freq: Two times a day (BID) | ORAL | Status: DC

## 2015-11-04 NOTE — ED Notes (Signed)
Patient transported to CT 
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
## 2015-11-04 NOTE — ED Notes (Signed)
SEE PA assessment

## 2015-11-04 NOTE — ED Notes (Signed)
Patient verbalized understanding of discharge instructions and denies any further needs or questions at this time. VS stable. Patient ambulatory with steady gait.  

## 2016-06-21 ENCOUNTER — Emergency Department (HOSPITAL_COMMUNITY)
Admission: EM | Admit: 2016-06-21 | Discharge: 2016-06-21 | Disposition: A | Discharge status: Home / Self Care | Payer: BLUE CROSS/BLUE SHIELD | Attending: Emergency Medicine | Admitting: Emergency Medicine

## 2016-06-21 ENCOUNTER — Encounter (HOSPITAL_COMMUNITY): Payer: Self-pay

## 2016-06-21 ENCOUNTER — Emergency Department (HOSPITAL_COMMUNITY): Payer: BLUE CROSS/BLUE SHIELD

## 2016-06-21 DIAGNOSIS — Y929 Unspecified place or not applicable: Secondary | ICD-10-CM | POA: Insufficient documentation

## 2016-06-21 DIAGNOSIS — M25511 Pain in right shoulder: Secondary | ICD-10-CM

## 2016-06-21 DIAGNOSIS — Z955 Presence of coronary angioplasty implant and graft: Secondary | ICD-10-CM | POA: Insufficient documentation

## 2016-06-21 DIAGNOSIS — Z79899 Other long term (current) drug therapy: Secondary | ICD-10-CM | POA: Insufficient documentation

## 2016-06-21 DIAGNOSIS — I509 Heart failure, unspecified: Secondary | ICD-10-CM | POA: Diagnosis not present

## 2016-06-21 DIAGNOSIS — I110 Hypertensive heart disease with heart failure: Secondary | ICD-10-CM | POA: Diagnosis not present

## 2016-06-21 DIAGNOSIS — Y9389 Activity, other specified: Secondary | ICD-10-CM | POA: Insufficient documentation

## 2016-06-21 DIAGNOSIS — W1789XA Other fall from one level to another, initial encounter: Secondary | ICD-10-CM | POA: Diagnosis not present

## 2016-06-21 DIAGNOSIS — S8002XA Contusion of left knee, initial encounter: Secondary | ICD-10-CM | POA: Diagnosis not present

## 2016-06-21 DIAGNOSIS — Z87891 Personal history of nicotine dependence: Secondary | ICD-10-CM | POA: Insufficient documentation

## 2016-06-21 DIAGNOSIS — I2510 Atherosclerotic heart disease of native coronary artery without angina pectoris: Secondary | ICD-10-CM | POA: Diagnosis not present

## 2016-06-21 DIAGNOSIS — Z7982 Long term (current) use of aspirin: Secondary | ICD-10-CM | POA: Diagnosis not present

## 2016-06-21 DIAGNOSIS — E119 Type 2 diabetes mellitus without complications: Secondary | ICD-10-CM | POA: Insufficient documentation

## 2016-06-21 DIAGNOSIS — W19XXXA Unspecified fall, initial encounter: Secondary | ICD-10-CM

## 2016-06-21 DIAGNOSIS — Z7984 Long term (current) use of oral hypoglycemic drugs: Secondary | ICD-10-CM | POA: Insufficient documentation

## 2016-06-21 DIAGNOSIS — S8992XA Unspecified injury of left lower leg, initial encounter: Secondary | ICD-10-CM | POA: Diagnosis present

## 2016-06-21 DIAGNOSIS — Y999 Unspecified external cause status: Secondary | ICD-10-CM | POA: Diagnosis not present

## 2016-06-21 MED ORDER — OXYCODONE-ACETAMINOPHEN 5-325 MG PO TABS
1.0000 | ORAL_TABLET | Freq: Once | ORAL | Status: AC
  Administered 2016-06-21: 1 via ORAL
  Filled 2016-06-21: qty 1

## 2016-06-21 MED ORDER — CYCLOBENZAPRINE HCL 10 MG PO TABS: 10.0000 mg | ORAL_TABLET | Freq: Two times a day (BID) | ORAL | 0 refills | Status: DC | PRN

## 2016-06-21 MED ORDER — HYDROCODONE-ACETAMINOPHEN 5-325 MG PO TABS: 1.0000 | ORAL_TABLET | Freq: Four times a day (QID) | ORAL | 0 refills | Status: DC | PRN
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
## 2016-06-21 NOTE — Discharge Instructions (Signed)
The pain medications will make you sleepy. Do not drive while taking them.
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
## 2016-07-07 DIAGNOSIS — G8929 Other chronic pain: Secondary | ICD-10-CM | POA: Insufficient documentation

## 2016-07-07 DIAGNOSIS — M25511 Pain in right shoulder: Secondary | ICD-10-CM | POA: Insufficient documentation

## 2017-02-07 ENCOUNTER — Emergency Department (HOSPITAL_COMMUNITY)
Admission: EM | Admit: 2017-02-07 | Discharge: 2017-02-07 | Disposition: A | Discharge status: Home / Self Care | Payer: BLUE CROSS/BLUE SHIELD | Attending: Emergency Medicine | Admitting: Emergency Medicine

## 2017-02-07 ENCOUNTER — Emergency Department (HOSPITAL_COMMUNITY): Payer: BLUE CROSS/BLUE SHIELD

## 2017-02-07 ENCOUNTER — Encounter (HOSPITAL_COMMUNITY): Payer: Self-pay | Admitting: Emergency Medicine

## 2017-02-07 DIAGNOSIS — E119 Type 2 diabetes mellitus without complications: Secondary | ICD-10-CM | POA: Diagnosis not present

## 2017-02-07 DIAGNOSIS — Z7984 Long term (current) use of oral hypoglycemic drugs: Secondary | ICD-10-CM | POA: Diagnosis not present

## 2017-02-07 DIAGNOSIS — G43009 Migraine without aura, not intractable, without status migrainosus: Secondary | ICD-10-CM | POA: Insufficient documentation

## 2017-02-07 DIAGNOSIS — Z7982 Long term (current) use of aspirin: Secondary | ICD-10-CM | POA: Insufficient documentation

## 2017-02-07 DIAGNOSIS — Z87891 Personal history of nicotine dependence: Secondary | ICD-10-CM | POA: Diagnosis not present

## 2017-02-07 DIAGNOSIS — R1011 Right upper quadrant pain: Secondary | ICD-10-CM | POA: Insufficient documentation

## 2017-02-07 DIAGNOSIS — Z955 Presence of coronary angioplasty implant and graft: Secondary | ICD-10-CM | POA: Diagnosis not present

## 2017-02-07 DIAGNOSIS — I110 Hypertensive heart disease with heart failure: Secondary | ICD-10-CM | POA: Diagnosis not present

## 2017-02-07 DIAGNOSIS — I2510 Atherosclerotic heart disease of native coronary artery without angina pectoris: Secondary | ICD-10-CM | POA: Insufficient documentation

## 2017-02-07 DIAGNOSIS — R109 Unspecified abdominal pain: Secondary | ICD-10-CM | POA: Diagnosis present

## 2017-02-07 DIAGNOSIS — I509 Heart failure, unspecified: Secondary | ICD-10-CM | POA: Diagnosis not present

## 2017-02-07 DIAGNOSIS — Z79899 Other long term (current) drug therapy: Secondary | ICD-10-CM | POA: Diagnosis not present

## 2017-02-07 LAB — URINALYSIS, ROUTINE W REFLEX MICROSCOPIC
BACTERIA UA: NONE SEEN
BILIRUBIN URINE: NEGATIVE
Glucose, UA: NEGATIVE mg/dL
HGB URINE DIPSTICK: NEGATIVE
KETONES UR: NEGATIVE mg/dL
NITRITE: NEGATIVE
PROTEIN: NEGATIVE mg/dL
Specific Gravity, Urine: 1.017 (ref 1.005–1.030)
WBC UA: NONE SEEN WBC/hpf (ref 0–5)
pH: 5 (ref 5.0–8.0)

## 2017-02-07 LAB — COMPREHENSIVE METABOLIC PANEL
ALBUMIN: 3.5 g/dL (ref 3.5–5.0)
ALT: 19 U/L (ref 14–54)
ANION GAP: 10 (ref 5–15)
AST: 18 U/L (ref 15–41)
Alkaline Phosphatase: 98 U/L (ref 38–126)
BILIRUBIN TOTAL: 0.5 mg/dL (ref 0.3–1.2)
BUN: 10 mg/dL (ref 6–20)
CO2: 22 mmol/L (ref 22–32)
Calcium: 8.4 mg/dL — ABNORMAL LOW (ref 8.9–10.3)
Chloride: 102 mmol/L (ref 101–111)
Creatinine, Ser: 0.86 mg/dL (ref 0.44–1.00)
GFR calc Af Amer: >60 mL/min (ref >60)
GFR calc non Af Amer: >60 mL/min (ref >60)
Glucose, Bld: 206 mg/dL — ABNORMAL HIGH (ref 65–99)
POTASSIUM: 3.9 mmol/L (ref 3.5–5.1)
Sodium: 134 mmol/L — ABNORMAL LOW (ref 135–145)
TOTAL PROTEIN: 7.1 g/dL (ref 6.5–8.1)

## 2017-02-07 LAB — CBC
HEMATOCRIT: 33.9 % — AB (ref 36.0–46.0)
HEMOGLOBIN: 11.3 g/dL — AB (ref 12.0–15.0)
MCH: 24.2 pg — ABNORMAL LOW (ref 26.0–34.0)
MCHC: 33.3 g/dL (ref 30.0–36.0)
MCV: 72.7 fL — ABNORMAL LOW (ref 78.0–100.0)
Platelets: 255 10*3/uL (ref 150–400)
RBC: 4.66 MIL/uL (ref 3.87–5.11)
RDW: 14.2 % (ref 11.5–15.5)
WBC: 3 10*3/uL — AB (ref 4.0–10.5)

## 2017-02-07 LAB — LIPASE, BLOOD: LIPASE: 20 U/L (ref 11–51)

## 2017-02-07 LAB — I-STAT BETA HCG BLOOD, ED (MC, WL, AP ONLY)

## 2017-02-07 MED ORDER — ONDANSETRON 4 MG PO TBDP: 4.0000 mg | ORAL_TABLET | Freq: Three times a day (TID) | ORAL | 0 refills | Status: DC | PRN

## 2017-02-07 MED ORDER — KETOROLAC TROMETHAMINE 30 MG/ML IJ SOLN
30.0000 mg | Freq: Once | INTRAMUSCULAR | Status: AC
  Administered 2017-02-07: 30 mg via INTRAVENOUS

## 2017-02-07 MED ORDER — SODIUM CHLORIDE 0.9 % IV BOLUS (SEPSIS)
1000.0000 mL | Freq: Once | INTRAVENOUS | Status: AC
  Administered 2017-02-07: 1000 mL via INTRAVENOUS

## 2017-02-07 MED ORDER — DEXAMETHASONE SODIUM PHOSPHATE 10 MG/ML IJ SOLN
10.0000 mg | Freq: Once | INTRAMUSCULAR | Status: AC
  Administered 2017-02-07: 10 mg via INTRAVENOUS

## 2017-02-07 MED ORDER — DEXAMETHASONE SODIUM PHOSPHATE 10 MG/ML IJ SOLN
10.0000 mg | Freq: Once | INTRAMUSCULAR | Status: DC
  Filled 2017-02-07: qty 1

## 2017-02-07 MED ORDER — KETOROLAC TROMETHAMINE 30 MG/ML IJ SOLN
30.0000 mg | Freq: Once | INTRAMUSCULAR | Status: DC
  Filled 2017-02-07: qty 1

## 2017-02-07 MED ORDER — PROCHLORPERAZINE EDISYLATE 5 MG/ML IJ SOLN
10.0000 mg | Freq: Once | INTRAMUSCULAR | Status: AC
  Administered 2017-02-07: 10 mg via INTRAVENOUS
  Filled 2017-02-07: qty 2

## 2017-02-07 NOTE — ED Notes (Signed)
Patient transported to Ultrasound 

## 2017-02-07 NOTE — ED Triage Notes (Signed)
Onset one day ago developed general abdominal pain with nausea continued today woke up with a headache 0630.  Alert answering and following commands appropriate.

## 2017-02-07 NOTE — ED Notes (Signed)
Patient transported to CT 

## 2017-02-07 NOTE — Medical Student Note (Signed)
[facility] DEPT Provider Student Note For educational purposes for Medical, PA and NP students only and not part of the legal medical record.   CSN: [id number] Arrival date & time: 02/07/17  8182     History   Chief Complaint Chief Complaint  Patient presents with   Abdominal Pain   Headache   Nausea    HPI [name] is a 51 y.o. female, hx DM, HTN, concussion, DVT, HLD, CAD, OSA, who presents with headache and abdominal pain. Patient reports abdominal pain since yesterday afternoon in the epigastric and RUQ. She says that the pain worsens after eating, and feels like "a stomach virus". Other than that she cannot describe the pain. She reports nausea, and denies any vomiting or diarrhea. No chest pain or SOB. She does have a history of gallbladder sludge found in 2017.  She also reports a severe headache since this morning when she woke up, did not wake her out of her sleep. She reports frontal and posterior pain and describes it as sharp sometimes. She says she cannot turn her head to the right because of pain. The headache is constant. Worse with change in position. Endorses dizziness and unsteadiness with the headache. She has never had a history of headaches like this before. She denies personal history of migraines. No photophobia or aura.    HPI  Past Medical History:  Diagnosis Date   Anemia    CHF (congestive heart failure) ([place]) 04/2011   EF 15-20% from echo 05/19/11   Diabetes mellitus without complication ([facility])    DOE (dyspnea on exertion)    DVT (deep venous thrombosis) ([place]) 06/2011   Fatigue    HTN (hypertension)    Menorrhagia    with iron deficient anemia   Obesity    Orthopnea    Ovarian cyst     Patient Active Problem List   Diagnosis Date Noted   OSA (obstructive sleep apnea) 02/21/2013   CAD (coronary artery disease), native coronary artery 11/18/2011   Hyperlipidemia 11/18/2011   Chest pain syndrome 11/17/2011    Abnormal cardiovascular stress test 11/17/2011   DVT (deep venous thrombosis) ([place]) 11/01/2011   Hypertension 11/01/2011   Anemia 11/01/2011    Past Surgical History:  Procedure Laterality Date   CORONARY STENT PLACEMENT     cyst removed from ovary     [place] N/A 11/17/2011   Procedure: LEFT HEART CATHETERIZATION WITH CORONARY ANGIOGRAM;  Surgeon: Laverda Page, MD;  Location: [facility] CATH LAB;  Service: Cardiovascular;  Laterality: N/A;   TUBAL LIGATION  2006    OB History    No data available       Home Medications    Prior to Admission medications   Medication Sig Start Date End Date Taking? Authorizing Provider  amLODipine (NORVASC) 5 MG tablet Take 5 mg by mouth every evening.     Historical Provider, MD  aspirin 81 MG tablet Take 81 mg by mouth every evening.     Historical Provider, MD  carvedilol (COREG) 6.25 MG tablet Take 6.25 mg by mouth 2 (two) times daily.     Historical Provider, MD  Cholecalciferol (VITAMIN D-3) 1000 UNITS CAPS Take 1 capsule by mouth daily.     Historical Provider, MD  cyclobenzaprine (FLEXERIL) 10 MG tablet Take 1 tablet (10 mg total) by mouth 2 (two) times daily as needed for muscle spasms. 06/21/16   Hope Bunnie Pion, NP  dicyclomine (BENTYL) 20 MG tablet Take  1 tablet (20 mg total) by mouth 2 (two) times daily. Patient not taking: Reported on 11/04/2015 11/01/14   Loma Sousa Forcucci, PA-C  Ferrous Sulfate (IRON) 325 (65 FE) MG TABS Take 1 tablet by mouth 2 (two) times daily.      Historical Provider, MD  furosemide (LASIX) 40 MG tablet Take 40 mg by mouth daily.     Historical Provider, MD  HYDROcodone-acetaminophen (NORCO) 5-325 MG tablet Take 1 tablet by mouth every 6 (six) hours as needed. 06/21/16   Hope Bunnie Pion, NP  ibuprofen (ADVIL,MOTRIN) 800 MG tablet Take 1 tablet (800 mg total) by mouth 3 (three) times daily. 11/04/15   Marella Chimes, PA-C  losartan (COZAAR) 25 MG tablet Take 25 mg by mouth  daily.    Historical Provider, MD  metFORMIN (GLUCOPHAGE) 500 MG tablet Take 500 mg by mouth daily with breakfast.     Historical Provider, MD  methocarbamol (ROBAXIN) 500 MG tablet Take 1 tablet (500 mg total) by mouth 2 (two) times daily. 11/04/15   Marella Chimes, PA-C  omeprazole (PRILOSEC) 20 MG capsule Take 1 capsule (20 mg total) by mouth daily. Patient not taking: Reported on 11/04/2015 11/01/14   Loma Sousa Forcucci, PA-C  ondansetron (ZOFRAN) 4 MG tablet Take 1 tablet (4 mg total) by mouth every 6 (six) hours. Patient not taking: Reported on 11/04/2015 11/01/14   Starlyn Skeans, PA-C  oxyCODONE-acetaminophen (PERCOCET) 5-325 MG per tablet Take 1 tablet by mouth every 4 (four) hours as needed for severe pain. Patient not taking: Reported on 09/05/2014 05/13/14   Tatyana Kirichenko, PA-C  pravastatin (PRAVACHOL) 40 MG tablet Take 40 mg by mouth every evening.     Historical Provider, MD  traMADol (ULTRAM) 50 MG tablet Take 1 tablet (50 mg total) by mouth every 6 (six) hours as needed. Patient not taking: Reported on 11/04/2015 08/26/14   Carmin Muskrat, MD    Family History Family History  Problem Relation Age of Onset   Hypertension Mother    Stroke Father     Social History Social History  Substance Use Topics   Smoking status: Former Smoker    Packs/day: 0.50    Years: 5.00    Types: Cigarettes    Quit date: 10/19/1993   Smokeless tobacco: Never Used   Alcohol use No     Allergies   Detrol [tolterodine tartrate]   Review of Systems Review of Systems  See HPI  Physical Exam Updated Vital Signs BP 139/88 (BP Location: Left Arm)    Pulse 74    Temp 97.9 F (36.6 C) (Oral)    Resp 18    Ht 5\' 2"  (1.575 m)    Wt 102.1 kg    SpO2 99%    BMI 41.15 kg/m   Physical Exam  Constitutional: She is oriented to person, place, and time. She appears well-developed and well-nourished. No distress.  HENT:  Head: Normocephalic and atraumatic.  Eyes: Conjunctivae are  normal. Pupils are equal, round, and reactive to light.  Pauses at EOMs to right side and has to start over. Few horizontal beats of nystagmus  Neck:  Cannot turn neck right due to pain. Other ROM normal  Cardiovascular: Normal rate, regular rhythm, normal heart sounds and intact distal pulses.   Pulmonary/Chest: Effort normal and breath sounds normal.  Abdominal: Soft. There is tenderness.  TTP RUQ Murphys sign +  Neurological: She is alert and oriented to person, place, and time.  Skin: Skin is warm and dry.  Psychiatric:  She has a normal mood and affect.     ED Treatments / Results  Labs (all labs ordered are listed, but only abnormal results are displayed) Labs Reviewed  LIPASE, BLOOD  COMPREHENSIVE METABOLIC PANEL  CBC  URINALYSIS, ROUTINE W REFLEX MICROSCOPIC    EKG  EKG Interpretation None       Radiology No results found.  Procedures Procedures (including critical care time)  Medications Ordered in ED Medications - No data to display   Initial Impression / Assessment and Plan / ED Course  I have reviewed the triage vital signs and the nursing notes.  Pertinent labs & imaging results that were available during my care of the patient were reviewed by me and considered in my medical decision making (see chart for details).     Due to patient reported onset of acute severe headache with no prior history of headache and her multiple comorbidities, obtained noncon head CT which was negative for acute abnormality. For Symptomatic control, initiated headache cocktail in ED and fluids. Patient states headache was completely relieved with the medication. Likely migraine in etiology. Will discharge patient with close PCP follow up.  For abdominal pain, CBC/CMP/lipase/pregnancy test obtained. LFTs normal. Pregnancy test negative. Abd ultrasound ordered due to positive murphys sign and history of gallbladder sludge, pain worsened with food intake. Pt reported that she  still has her gallbladder. Abd ultrasound was negative for acute abnormality and no gallbladder pathology was found. Her abdominal pain seemed to also resolve with headache medication. Will plan to discharge patient.   Return precautions advised and specifically reviewed acute stroke symptoms.  Final Clinical Impressions(s) / ED Diagnoses   Final diagnoses:  RUQ pain  Migraine without aura and without status migrainosus, not intractable    New Prescriptions New Prescriptions   No medications on file

## 2017-02-07 NOTE — ED Notes (Signed)
Pt returns from ct  

## 2017-02-07 NOTE — Discharge Instructions (Signed)
He may take Tylenol or ibuprofen as prescribed over-the-counter and for pain relief. Take your prescription of Zofran as needed for nausea. I recommend continuing to drink fluids at home to remain hydrated. Follow-up with your primary care provider within the next 3-4 days for follow-up evaluation regarding her headache and abdominal pain. I recommend following up with the neurology clinic listed below within the next 1-2 weeks if he continued to have headaches. Please return to the Emergency Department if symptoms worsen or new onset of fever, new/worsening headache, dizziness, visual changes, neck pain/stiffness, chest pain, difficulty breathing, new/worsening abdominal pain, vomiting, unable to keep fluids down, blood in emesis or stool, numbness, weakness, slurred speech, facial weakness.

## 2017-02-07 NOTE — ED Notes (Signed)
Pt ambulates in hallway without difficutly

## 2017-02-07 NOTE — ED Provider Notes (Signed)
[place] DEPT Provider Note   CSN: [id number] Arrival date & time: 02/07/17  4098     History   Chief Complaint Chief Complaint  Patient presents with  . Abdominal Pain  . Headache  . Nausea    HPI [name] is a 51 y.o. female.  HPI   Patient is a 51 year old female with history of diabetes, CHF, hypertension, DVT, anemia, ovarian cyst status post surgical removal who presents to the ED from home with multiple complaints. Patient reports when she woke up this morning she noticed she had a "bad headache" to her frontal and occipital region. Patient states the headache is constant but notes it gradually worsens with sitting up or standing. She also states the headache worsens when she moves her neck to the right or looks to the right. Endorses associated dizziness which she describes as "feeling unsteady". Patient denies taking any medications for her headache. Denies history of headache or migraines. Pt denies fever, neck stiffness or pain, visual changes, photophobia, urinary symptoms, numbness, tingling, weakness, seizures, syncope.  Denies any recent fall or head injury.   Patient also presents with upper abdominal pain started yesterday afternoon. She reports having intermittent sharp pain to her right upper quadrant which she notes is worse after eating. Endorses associated nausea. Denies taking any medications for her symptoms. Denies fever, chills, chest pain, shortness of breath, cough, flank pain, vomiting, diarrhea, constipation, urinary symptoms, vaginal bleeding or discharge. Patient denies history of similar symptoms. She notes her sxs feel as if she has a viral GI bug. Endorses abdominal surgical history of ovarian cyst removal and tubal ligation. LMP 01/15/17.   Past Medical History:  Diagnosis Date  . Anemia   . CHF (congestive heart failure) ([place]) 04/2011   EF 15-20% from echo 05/19/11  . Diabetes mellitus without complication ([place])   . DOE (dyspnea on  exertion)   . DVT (deep venous thrombosis) ([place]) 06/2011  . Fatigue   . HTN (hypertension)   . Menorrhagia    with iron deficient anemia  . Obesity   . Orthopnea   . Ovarian cyst     Patient Active Problem List   Diagnosis Date Noted  . OSA (obstructive sleep apnea) 02/21/2013  . CAD (coronary artery disease), native coronary artery 11/18/2011  . Hyperlipidemia 11/18/2011  . Chest pain syndrome 11/17/2011  . Abnormal cardiovascular stress test 11/17/2011  . DVT (deep venous thrombosis) ([place]) 11/01/2011  . Hypertension 11/01/2011  . Anemia 11/01/2011    Past Surgical History:  Procedure Laterality Date  . CORONARY STENT PLACEMENT    . cyst removed from ovary    . LEFT HEART CATHETERIZATION WITH CORONARY ANGIOGRAM N/A 11/17/2011   Procedure: LEFT HEART CATHETERIZATION WITH CORONARY ANGIOGRAM;  Surgeon: Laverda Page, MD;  Location: [facility] CATH LAB;  Service: Cardiovascular;  Laterality: N/A;  . TUBAL LIGATION  2006    OB History    No data available       Home Medications    Prior to Admission medications   Medication Sig Start Date End Date Taking? Authorizing Provider  amLODipine (NORVASC) 5 MG tablet Take 5 mg by mouth every evening.    Yes Historical Provider, MD  aspirin 81 MG tablet Take 81 mg by mouth every evening.    Yes Historical Provider, MD  atorvastatin (LIPITOR) 40 MG tablet Take 40 mg by mouth daily. 12/06/16  Yes Historical Provider, MD  carvedilol (COREG) 6.25 MG tablet Take 6.25 mg by mouth 2 (two)  times daily.    Yes Historical Provider, MD  Cholecalciferol (VITAMIN D-3) 1000 UNITS CAPS Take 1 capsule by mouth daily.    Yes Historical Provider, MD  Ferrous Sulfate (IRON) 325 (65 FE) MG TABS Take 1 tablet by mouth 2 (two) times daily.     Yes Historical Provider, MD  furosemide (LASIX) 40 MG tablet Take 40 mg by mouth daily.    Yes Historical Provider, MD  ibuprofen (ADVIL,MOTRIN) 800 MG tablet Take 1 tablet (800 mg total) by mouth 3 (three) times daily.  11/04/15  Yes Marella Chimes, PA-C  losartan (COZAAR) 25 MG tablet Take 25 mg by mouth daily.   Yes Historical Provider, MD  metFORMIN (GLUCOPHAGE) 500 MG tablet Take 500 mg by mouth daily with breakfast.    Yes Historical Provider, MD  cyclobenzaprine (FLEXERIL) 10 MG tablet Take 1 tablet (10 mg total) by mouth 2 (two) times daily as needed for muscle spasms. Patient not taking: Reported on 02/07/2017 06/21/16   Ashley Murrain, NP  dicyclomine (BENTYL) 20 MG tablet Take 1 tablet (20 mg total) by mouth 2 (two) times daily. Patient not taking: Reported on 02/07/2017 11/01/14   Starlyn Skeans, PA-C  HYDROcodone-acetaminophen (NORCO) 5-325 MG tablet Take 1 tablet by mouth every 6 (six) hours as needed. Patient not taking: Reported on 02/07/2017 06/21/16   Ashley Murrain, NP  methocarbamol (ROBAXIN) 500 MG tablet Take 1 tablet (500 mg total) by mouth 2 (two) times daily. Patient not taking: Reported on 02/07/2017 11/04/15   Marella Chimes, PA-C  omeprazole (PRILOSEC) 20 MG capsule Take 1 capsule (20 mg total) by mouth daily. Patient not taking: Reported on 11/04/2015 11/01/14   Loma Sousa Forcucci, PA-C  ondansetron (ZOFRAN ODT) 4 MG disintegrating tablet Take 1 tablet (4 mg total) by mouth every 8 (eight) hours as needed for nausea or vomiting. 02/07/17   Nona Dell, PA-C  ondansetron (ZOFRAN) 4 MG tablet Take 1 tablet (4 mg total) by mouth every 6 (six) hours. Patient not taking: Reported on 11/04/2015 11/01/14   Starlyn Skeans, PA-C  oxyCODONE-acetaminophen (PERCOCET) 5-325 MG per tablet Take 1 tablet by mouth every 4 (four) hours as needed for severe pain. Patient not taking: Reported on 09/05/2014 05/13/14   Tatyana Kirichenko, PA-C  pravastatin (PRAVACHOL) 40 MG tablet Take 40 mg by mouth every evening.     Historical Provider, MD  traMADol (ULTRAM) 50 MG tablet Take 1 tablet (50 mg total) by mouth every 6 (six) hours as needed. Patient not taking: Reported on 11/04/2015 08/26/14   Carmin Muskrat, MD    Family History Family History  Problem Relation Age of Onset  . Hypertension Mother   . Stroke Father     Social History Social History  Substance Use Topics  . Smoking status: Former Smoker    Packs/day: 0.50    Years: 5.00    Types: Cigarettes    Quit date: 10/19/1993  . Smokeless tobacco: Never Used  . Alcohol use No     Allergies   Detrol [tolterodine tartrate]   Review of Systems Review of Systems  Gastrointestinal: Positive for abdominal pain and nausea.  Neurological: Positive for dizziness and headaches.  All other systems reviewed and are negative.    Physical Exam Updated Vital Signs BP (!) 130/91 (BP Location: Right Arm)   Pulse 76   Temp 97.9 F (36.6 C) (Oral)   Resp 18   Ht 5\' 2"  (1.575 m)   Wt 102.1 kg  SpO2 98%   BMI 41.15 kg/m   Physical Exam  Constitutional: She is oriented to person, place, and time. She appears well-developed and well-nourished. No distress.  HENT:  Head: Normocephalic and atraumatic.  Right Ear: Tympanic membrane normal.  Left Ear: Tympanic membrane normal.  Nose: Nose normal. Right sinus exhibits no maxillary sinus tenderness and no frontal sinus tenderness. Left sinus exhibits no maxillary sinus tenderness and no frontal sinus tenderness.  Mouth/Throat: Uvula is midline, oropharynx is clear and moist and mucous membranes are normal. No oropharyngeal exudate, posterior oropharyngeal edema, posterior oropharyngeal erythema or tonsillar abscesses.  Eyes: Conjunctivae and EOM are normal. Pupils are equal, round, and reactive to light. Right eye exhibits no discharge. Left eye exhibits no discharge. No scleral icterus.  Pt unable to perform complete rightward gaze due to worsening headache. No nystagmus with leftward gaze.  Neck: Neck supple. No spinous process tenderness and no muscular tenderness present. No neck rigidity. Decreased range of motion (to right due to pain) present. No erythema present.    Unable to move neck to right with FROM due to worsening headache.  Cardiovascular: Normal rate, regular rhythm, normal heart sounds and intact distal pulses.   Pulmonary/Chest: Effort normal and breath sounds normal. No respiratory distress. She has no wheezes. She has no rales. She exhibits no tenderness.  Abdominal: Soft. Normal appearance and bowel sounds are normal. She exhibits no distension and no mass. There is tenderness in the right upper quadrant, right lower quadrant and epigastric area. There is positive Murphy's sign. There is no rigidity, no rebound, no guarding and no CVA tenderness. No hernia.  TTP over RLQ, RUQ, epigastric region; worse over RUQ.   Musculoskeletal: She exhibits no edema.  No midline cervical spine tenderness. FROM of BUE and BLE with 5/5 strength.  Lymphadenopathy:    She has no cervical adenopathy.  Neurological: She is alert and oriented to person, place, and time. She has normal strength. No cranial nerve deficit or sensory deficit. She displays a negative Romberg sign. Coordination normal. GCS eye subscore is 4. GCS verbal subscore is 5. GCS motor subscore is 6.  Skin: Skin is warm and dry. She is not diaphoretic.  Nursing note and vitals reviewed.    ED Treatments / Results  Labs (all labs ordered are listed, but only abnormal results are displayed) Labs Reviewed  COMPREHENSIVE METABOLIC PANEL - Abnormal; Notable for the following:       Result Value   Sodium 134 (*)    Glucose, Bld 206 (*)    Calcium 8.4 (*)    All other components within normal limits  CBC - Abnormal; Notable for the following:    WBC 3.0 (*)    Hemoglobin 11.3 (*)    HCT 33.9 (*)    MCV 72.7 (*)    MCH 24.2 (*)    All other components within normal limits  LIPASE, BLOOD  URINALYSIS, ROUTINE W REFLEX MICROSCOPIC  I-STAT BETA HCG BLOOD, ED ([facility], [facility], [facility] ONLY)    EKG  EKG Interpretation None       Radiology Ct Head Wo Contrast  Result Date: 02/07/2017 CLINICAL  DATA:  Headache, nausea and vomiting beginning this morning. EXAM: CT HEAD WITHOUT CONTRAST TECHNIQUE: Contiguous axial images were obtained from the base of the skull through the vertex without intravenous contrast. COMPARISON:  Head CT scan 09/05/2014. FINDINGS: Brain: Appears normal without hemorrhage, infarct, mass lesion, mass effect, midline shift or abnormal extra-axial fluid collection. No hydrocephalus or pneumocephalus.  Vascular: Atherosclerotic calcifications are identified. Skull: Intact. Sinuses/Orbits: Mild mucosal thickening left sphenoid sinus is seen and unchanged. Other: None. IMPRESSION: No acute abnormality. Atherosclerosis. Mild mucosal thickening left sphenoid sinus, chronic. Electronically Signed   By: Inge Rise M.D.   On: 02/07/2017 10:47   US Abdomen Complete  Result Date: 02/07/2017 CLINICAL DATA:  Right upper quadrant abdominal pain for 1 day with nausea. The patient also describes diffuse abdominal pain. EXAM: ABDOMEN ULTRASOUND COMPLETE COMPARISON:  Abdomen and pelvis CT dated 11/04/2015. FINDINGS: Gallbladder: No gallstones or wall thickening visualized. No sonographic Murphy sign noted by sonographer. Common bile duct: Diameter: 3.1 mm Liver: No focal lesion identified. Within normal limits in parenchymal echogenicity. IVC: No abnormality visualized. Pancreas: Visualized portion unremarkable. Spleen: Size and appearance within normal limits. Right Kidney: Length: 12.4 cm. Echogenicity within normal limits. No mass or hydronephrosis visualized. Left Kidney: Length: 11.4 cm. Echogenicity within normal limits. No mass or hydronephrosis visualized. Abdominal aorta: The distal abdominal aorta was obscured by overlying bowel gas. No aneurysm seen. Other findings: None. IMPRESSION: Normal examination. Electronically Signed   By: Claudie Revering M.D.   On: 02/07/2017 11:23    Procedures Procedures (including critical care time)  Medications Ordered in ED Medications  sodium  chloride 0.9 % bolus 1,000 mL (0 mLs Intravenous Stopped 02/07/17 1014)  prochlorperazine (COMPAZINE) injection 10 mg (10 mg Intravenous Given 02/07/17 0933)  ketorolac (TORADOL) 30 MG/ML injection 30 mg (30 mg Intravenous Given 02/07/17 0903)  dexamethasone (DECADRON) injection 10 mg (10 mg Intravenous Given 02/07/17 7673)     Initial Impression / Assessment and Plan / ED Course  I have reviewed the triage vital signs and the nursing notes.  Pertinent labs & imaging results that were available during my care of the patient were reviewed by me and considered in my medical decision making (see chart for details).     Patient presents with headache that started when she woke up this morning with associated dizziness. Also reports having upper abdominal pain with associated nausea that started yesterday. Denies fever. VSS. On exam patient unable to continuously track eyes with rightward gaze due to worsening headache, unable to turn head to right side completely due to worsening headache, remaining exam unremarkable. Abdominal exam revealed tenderness over epigastric, right upper and lower quadrants, worse over right upper quadrant with positive Murphy sign. Remaining exam unremarkable. Patient given IV fluids and migraine cocktail.  Chart review shows pt was seen in the ED for similar RUQ abdominal sxs in 10/2014. RUQ US performed showed sludge in gallbladder without evidence of cholecystitis or cholelithiasis. CT abdomen/pelvic performed on 11/04/15 negative.   Labs unremarkable. Pregnancy negative. CT head negative. Due to patient with history of gallbladder sludge and continued abdominal pain will order right upper quadrant ultrasound for further evaluation. Ultrasound negative. On reevaluation patient is resting comfortably in bed and reports complete resolution of her headache after the migraine cocktail. She also reports improvement of abdominal pain and nausea. Patient able to tolerate PO. Suspect  patient's headache is likely due to to migraine and any further workup or imaging is warranted at this time. Patient's abdominal pain is likely due to gastritis. No indication of appendicitis, bowel obstruction, bowel perforation, cholecystitis, diverticulitis, PID or ectopic pregnancy.  Patient discharged home with symptomatic treatment and given strict instructions for follow-up with their primary care physician. Advised patient to follow up with neurology if her headache continues. I have also discussed reasons to return immediately to the ER.  Patient  expresses understanding and agrees with plan.    Final Clinical Impressions(s) / ED Diagnoses   Final diagnoses:  RUQ pain  Migraine without aura and without status migrainosus, not intractable    New Prescriptions New Prescriptions   ONDANSETRON (ZOFRAN ODT) 4 MG DISINTEGRATING TABLET    Take 1 tablet (4 mg total) by mouth every 8 (eight) hours as needed for nausea or vomiting.     Chesley Noon [place], [place] 02/07/17 [place], MD 02/08/17 [phone]

## 2017-02-07 NOTE — ED Notes (Signed)
Pt returns from US.

## 2017-02-07 NOTE — ED Notes (Signed)
Pt states she understands instructions. Home stable with family via wc.  

## 2017-10-26 ENCOUNTER — Encounter (HOSPITAL_COMMUNITY): Payer: Self-pay | Admitting: Emergency Medicine

## 2017-10-26 ENCOUNTER — Inpatient Hospital Stay (HOSPITAL_COMMUNITY)
Admission: EM | Admit: 2017-10-26 | Discharge: 2017-10-29 | DRG: 247 | Disposition: A | Discharge status: Home / Self Care | Payer: BLUE CROSS/BLUE SHIELD | Attending: Cardiology | Admitting: Cardiology

## 2017-10-26 ENCOUNTER — Other Ambulatory Visit: Payer: Self-pay

## 2017-10-26 ENCOUNTER — Emergency Department (HOSPITAL_COMMUNITY): Payer: BLUE CROSS/BLUE SHIELD

## 2017-10-26 DIAGNOSIS — Z823 Family history of stroke: Secondary | ICD-10-CM

## 2017-10-26 DIAGNOSIS — Z23 Encounter for immunization: Secondary | ICD-10-CM

## 2017-10-26 DIAGNOSIS — E1165 Type 2 diabetes mellitus with hyperglycemia: Secondary | ICD-10-CM | POA: Diagnosis present

## 2017-10-26 DIAGNOSIS — Z8249 Family history of ischemic heart disease and other diseases of the circulatory system: Secondary | ICD-10-CM

## 2017-10-26 DIAGNOSIS — Z6841 Body Mass Index (BMI) 40.0 and over, adult: Secondary | ICD-10-CM

## 2017-10-26 DIAGNOSIS — G4733 Obstructive sleep apnea (adult) (pediatric): Secondary | ICD-10-CM | POA: Diagnosis present

## 2017-10-26 DIAGNOSIS — Z888 Allergy status to other drugs, medicaments and biological substances status: Secondary | ICD-10-CM

## 2017-10-26 DIAGNOSIS — I214 Non-ST elevation (NSTEMI) myocardial infarction: Principal | ICD-10-CM

## 2017-10-26 DIAGNOSIS — I200 Unstable angina: Secondary | ICD-10-CM | POA: Diagnosis present

## 2017-10-26 DIAGNOSIS — I10 Essential (primary) hypertension: Secondary | ICD-10-CM | POA: Diagnosis present

## 2017-10-26 DIAGNOSIS — R0789 Other chest pain: Secondary | ICD-10-CM | POA: Diagnosis not present

## 2017-10-26 DIAGNOSIS — Z955 Presence of coronary angioplasty implant and graft: Secondary | ICD-10-CM

## 2017-10-26 DIAGNOSIS — Z7984 Long term (current) use of oral hypoglycemic drugs: Secondary | ICD-10-CM

## 2017-10-26 DIAGNOSIS — Z9861 Coronary angioplasty status: Secondary | ICD-10-CM

## 2017-10-26 DIAGNOSIS — Z79899 Other long term (current) drug therapy: Secondary | ICD-10-CM

## 2017-10-26 DIAGNOSIS — Z86718 Personal history of other venous thrombosis and embolism: Secondary | ICD-10-CM

## 2017-10-26 DIAGNOSIS — E785 Hyperlipidemia, unspecified: Secondary | ICD-10-CM | POA: Diagnosis present

## 2017-10-26 DIAGNOSIS — Z87891 Personal history of nicotine dependence: Secondary | ICD-10-CM

## 2017-10-26 DIAGNOSIS — I252 Old myocardial infarction: Secondary | ICD-10-CM | POA: Diagnosis present

## 2017-10-26 DIAGNOSIS — I25110 Atherosclerotic heart disease of native coronary artery with unstable angina pectoris: Secondary | ICD-10-CM | POA: Diagnosis present

## 2017-10-26 DIAGNOSIS — Z7982 Long term (current) use of aspirin: Secondary | ICD-10-CM

## 2017-10-26 HISTORY — DX: Non-ST elevation (NSTEMI) myocardial infarction: I21.4

## 2017-10-26 HISTORY — DX: Type 2 diabetes mellitus without complications: E11.9

## 2017-10-26 LAB — CBC
HCT: 32.8 % — ABNORMAL LOW (ref 36.0–46.0)
Hemoglobin: 10.9 g/dL — ABNORMAL LOW (ref 12.0–15.0)
MCH: 24.6 pg — AB (ref 26.0–34.0)
MCHC: 33.2 g/dL (ref 30.0–36.0)
MCV: 74 fL — AB (ref 78.0–100.0)
PLATELETS: 263 10*3/uL (ref 150–400)
RBC: 4.43 MIL/uL (ref 3.87–5.11)
RDW: 13.2 % (ref 11.5–15.5)
WBC: 6.1 10*3/uL (ref 4.0–10.5)

## 2017-10-26 LAB — I-STAT TROPONIN, ED: TROPONIN I, POC: 0.09 ng/mL — AB (ref 0.00–0.08)

## 2017-10-26 LAB — BASIC METABOLIC PANEL
Anion gap: 9 (ref 5–15)
BUN: 12 mg/dL (ref 6–20)
CHLORIDE: 102 mmol/L (ref 101–111)
CO2: 24 mmol/L (ref 22–32)
CREATININE: 0.85 mg/dL (ref 0.44–1.00)
Calcium: 8.8 mg/dL — ABNORMAL LOW (ref 8.9–10.3)
GFR calc non Af Amer: >60 mL/min (ref >60)
GLUCOSE: 323 mg/dL — AB (ref 65–99)
Potassium: 4.1 mmol/L (ref 3.5–5.1)
Sodium: 135 mmol/L (ref 135–145)

## 2017-10-26 LAB — I-STAT BETA HCG BLOOD, ED (MC, WL, AP ONLY): I-stat hCG, quantitative: <5 m[IU]/mL (ref <5)

## 2017-10-26 LAB — BRAIN NATRIURETIC PEPTIDE: B Natriuretic Peptide: 12.2 pg/mL (ref 0.0–100.0)

## 2017-10-26 NOTE — ED Triage Notes (Signed)
Patient with chest pain that started yesterday, getting worse today.  Patient does have shortness of breath with the pain.  Patient denies any nausea or vomiting.

## 2017-10-27 ENCOUNTER — Encounter (HOSPITAL_COMMUNITY)
Admission: EM | Disposition: A | Discharge status: Home / Self Care | Payer: Self-pay | Source: Home / Self Care | Attending: Cardiology

## 2017-10-27 ENCOUNTER — Emergency Department (HOSPITAL_COMMUNITY): Payer: BLUE CROSS/BLUE SHIELD

## 2017-10-27 ENCOUNTER — Encounter (HOSPITAL_COMMUNITY): Payer: Self-pay | Admitting: General Practice

## 2017-10-27 ENCOUNTER — Other Ambulatory Visit: Payer: Self-pay

## 2017-10-27 DIAGNOSIS — Z8249 Family history of ischemic heart disease and other diseases of the circulatory system: Secondary | ICD-10-CM | POA: Diagnosis not present

## 2017-10-27 DIAGNOSIS — I200 Unstable angina: Secondary | ICD-10-CM | POA: Diagnosis present

## 2017-10-27 DIAGNOSIS — Z823 Family history of stroke: Secondary | ICD-10-CM | POA: Diagnosis not present

## 2017-10-27 DIAGNOSIS — Z79899 Other long term (current) drug therapy: Secondary | ICD-10-CM | POA: Diagnosis not present

## 2017-10-27 DIAGNOSIS — Z9861 Coronary angioplasty status: Secondary | ICD-10-CM | POA: Diagnosis not present

## 2017-10-27 DIAGNOSIS — E785 Hyperlipidemia, unspecified: Secondary | ICD-10-CM | POA: Diagnosis not present

## 2017-10-27 DIAGNOSIS — Z888 Allergy status to other drugs, medicaments and biological substances status: Secondary | ICD-10-CM | POA: Diagnosis not present

## 2017-10-27 DIAGNOSIS — E1165 Type 2 diabetes mellitus with hyperglycemia: Secondary | ICD-10-CM | POA: Diagnosis not present

## 2017-10-27 DIAGNOSIS — Z6841 Body Mass Index (BMI) 40.0 and over, adult: Secondary | ICD-10-CM | POA: Diagnosis not present

## 2017-10-27 DIAGNOSIS — R0789 Other chest pain: Secondary | ICD-10-CM | POA: Diagnosis present

## 2017-10-27 DIAGNOSIS — I25110 Atherosclerotic heart disease of native coronary artery with unstable angina pectoris: Secondary | ICD-10-CM | POA: Diagnosis not present

## 2017-10-27 DIAGNOSIS — I214 Non-ST elevation (NSTEMI) myocardial infarction: Secondary | ICD-10-CM | POA: Diagnosis not present

## 2017-10-27 DIAGNOSIS — Z86718 Personal history of other venous thrombosis and embolism: Secondary | ICD-10-CM | POA: Diagnosis not present

## 2017-10-27 DIAGNOSIS — Z87891 Personal history of nicotine dependence: Secondary | ICD-10-CM | POA: Diagnosis not present

## 2017-10-27 DIAGNOSIS — I10 Essential (primary) hypertension: Secondary | ICD-10-CM | POA: Diagnosis not present

## 2017-10-27 DIAGNOSIS — Z7982 Long term (current) use of aspirin: Secondary | ICD-10-CM | POA: Diagnosis not present

## 2017-10-27 DIAGNOSIS — Z23 Encounter for immunization: Secondary | ICD-10-CM | POA: Diagnosis not present

## 2017-10-27 DIAGNOSIS — G4733 Obstructive sleep apnea (adult) (pediatric): Secondary | ICD-10-CM | POA: Diagnosis not present

## 2017-10-27 DIAGNOSIS — Z7984 Long term (current) use of oral hypoglycemic drugs: Secondary | ICD-10-CM | POA: Diagnosis not present

## 2017-10-27 HISTORY — PX: CORONARY STENT INTERVENTION: CATH118234

## 2017-10-27 HISTORY — PX: LEFT HEART CATH AND CORONARY ANGIOGRAPHY: CATH118249

## 2017-10-27 HISTORY — PX: ULTRASOUND GUIDANCE FOR VASCULAR ACCESS: SHX6516

## 2017-10-27 LAB — CBC
HCT: 31.3 % — ABNORMAL LOW (ref 36.0–46.0)
Hemoglobin: 10.1 g/dL — ABNORMAL LOW (ref 12.0–15.0)
MCH: 23.8 pg — ABNORMAL LOW (ref 26.0–34.0)
MCHC: 32.3 g/dL (ref 30.0–36.0)
MCV: 73.8 fL — ABNORMAL LOW (ref 78.0–100.0)
Platelets: 266 10*3/uL (ref 150–400)
RBC: 4.24 MIL/uL (ref 3.87–5.11)
RDW: 13.4 % (ref 11.5–15.5)
WBC: 5.6 10*3/uL (ref 4.0–10.5)

## 2017-10-27 LAB — PROTIME-INR
INR: 1.02
PROTHROMBIN TIME: 13.3 s (ref 11.4–15.2)

## 2017-10-27 LAB — GLUCOSE, CAPILLARY
Glucose-Capillary: 193 mg/dL — ABNORMAL HIGH (ref 65–99)
Glucose-Capillary: 223 mg/dL — ABNORMAL HIGH (ref 65–99)

## 2017-10-27 LAB — CBG MONITORING, ED: GLUCOSE-CAPILLARY: 279 mg/dL — AB (ref 65–99)

## 2017-10-27 LAB — I-STAT TROPONIN, ED: Troponin i, poc: 0.11 ng/mL (ref 0.00–0.08)

## 2017-10-27 LAB — POCT ACTIVATED CLOTTING TIME
ACTIVATED CLOTTING TIME: 246 s
Activated Clotting Time: 268 seconds

## 2017-10-27 LAB — HEMOGLOBIN A1C
Hgb A1c MFr Bld: 10.2 % — ABNORMAL HIGH (ref 4.8–5.6)
Mean Plasma Glucose: 246.04 mg/dL

## 2017-10-27 LAB — HEPARIN LEVEL (UNFRACTIONATED): HEPARIN UNFRACTIONATED: 0.43 [IU]/mL (ref 0.30–0.70)

## 2017-10-27 SURGERY — LEFT HEART CATH AND CORONARY ANGIOGRAPHY
Anesthesia: LOCAL

## 2017-10-27 MED ORDER — SODIUM CHLORIDE 0.9% FLUSH: 3.0000 mL | INTRAVENOUS | Status: DC | PRN

## 2017-10-27 MED ORDER — ASPIRIN EC 81 MG PO TBEC
81.0000 mg | DELAYED_RELEASE_TABLET | Freq: Every day | ORAL | Status: DC
  Administered 2017-10-27 – 2017-10-29 (×3): 81 mg via ORAL
  Filled 2017-10-27 (×3): qty 1

## 2017-10-27 MED ORDER — HEPARIN SODIUM (PORCINE) 1000 UNIT/ML IJ SOLN
INTRAMUSCULAR | Status: AC
  Filled 2017-10-27: qty 1

## 2017-10-27 MED ORDER — SODIUM CHLORIDE 0.9 % IV SOLN: 250.0000 mL | INTRAVENOUS | Status: DC | PRN

## 2017-10-27 MED ORDER — MIDAZOLAM HCL 2 MG/2ML IJ SOLN
INTRAMUSCULAR | Status: DC | PRN
  Administered 2017-10-27 (×2): 1 mg via INTRAVENOUS

## 2017-10-27 MED ORDER — ASPIRIN 81 MG PO CHEW: 324.0000 mg | CHEWABLE_TABLET | ORAL | Status: DC

## 2017-10-27 MED ORDER — LIDOCAINE HCL (PF) 1 % IJ SOLN
INTRAMUSCULAR | Status: AC
  Filled 2017-10-27: qty 30

## 2017-10-27 MED ORDER — TICAGRELOR 90 MG PO TABS
ORAL_TABLET | ORAL | Status: AC
  Filled 2017-10-27: qty 1

## 2017-10-27 MED ORDER — AMLODIPINE BESYLATE 10 MG PO TABS
10.0000 mg | ORAL_TABLET | Freq: Every day | ORAL | Status: DC
  Administered 2017-10-27 – 2017-10-28 (×2): 10 mg via ORAL
  Filled 2017-10-27: qty 1
  Filled 2017-10-27: qty 2
  Filled 2017-10-27: qty 1

## 2017-10-27 MED ORDER — PNEUMOCOCCAL VAC POLYVALENT 25 MCG/0.5ML IJ INJ
0.5000 mL | INJECTION | INTRAMUSCULAR | Status: AC
  Administered 2017-10-29: 0.5 mL via INTRAMUSCULAR
  Filled 2017-10-27: qty 0.5

## 2017-10-27 MED ORDER — FENTANYL CITRATE (PF) 100 MCG/2ML IJ SOLN
INTRAMUSCULAR | Status: DC | PRN
  Administered 2017-10-27 (×2): 25 ug via INTRAVENOUS

## 2017-10-27 MED ORDER — CARVEDILOL 12.5 MG PO TABS
25.0000 mg | ORAL_TABLET | Freq: Every day | ORAL | Status: DC
  Administered 2017-10-27 – 2017-10-29 (×3): 25 mg via ORAL
  Filled 2017-10-27 (×4): qty 2

## 2017-10-27 MED ORDER — ASPIRIN EC 81 MG PO TBEC: 81.0000 mg | DELAYED_RELEASE_TABLET | Freq: Every day | ORAL | Status: DC

## 2017-10-27 MED ORDER — ONDANSETRON HCL 4 MG/2ML IJ SOLN: 4.0000 mg | Freq: Four times a day (QID) | INTRAMUSCULAR | Status: DC | PRN

## 2017-10-27 MED ORDER — TICAGRELOR 90 MG PO TABS
90.0000 mg | ORAL_TABLET | Freq: Two times a day (BID) | ORAL | Status: DC
  Administered 2017-10-28 – 2017-10-29 (×4): 90 mg via ORAL
  Filled 2017-10-27 (×4): qty 1

## 2017-10-27 MED ORDER — ASPIRIN 81 MG PO CHEW: 81.0000 mg | CHEWABLE_TABLET | ORAL | Status: DC

## 2017-10-27 MED ORDER — VERAPAMIL HCL 2.5 MG/ML IV SOLN
INTRAVENOUS | Status: AC
  Filled 2017-10-27: qty 2

## 2017-10-27 MED ORDER — FENTANYL CITRATE (PF) 100 MCG/2ML IJ SOLN
INTRAMUSCULAR | Status: AC
  Filled 2017-10-27: qty 2

## 2017-10-27 MED ORDER — SODIUM CHLORIDE 0.9 % IV SOLN
INTRAVENOUS | Status: AC
  Administered 2017-10-27: 15:00:00 via INTRAVENOUS

## 2017-10-27 MED ORDER — ACETAMINOPHEN 325 MG PO TABS: 650.0000 mg | ORAL_TABLET | ORAL | Status: DC | PRN

## 2017-10-27 MED ORDER — ACETAMINOPHEN 325 MG PO TABS
650.0000 mg | ORAL_TABLET | ORAL | Status: DC | PRN
  Administered 2017-10-27: 650 mg via ORAL
  Filled 2017-10-27: qty 2

## 2017-10-27 MED ORDER — IOPAMIDOL (ISOVUE-370) INJECTION 76%
INTRAVENOUS | Status: AC
  Administered 2017-10-27: 100 mL
  Filled 2017-10-27: qty 100

## 2017-10-27 MED ORDER — HEPARIN BOLUS VIA INFUSION
4000.0000 [IU] | Freq: Once | INTRAVENOUS | Status: AC
  Administered 2017-10-27: 4000 [IU] via INTRAVENOUS
  Filled 2017-10-27: qty 4000

## 2017-10-27 MED ORDER — NITROGLYCERIN 0.4 MG SL SUBL
0.4000 mg | SUBLINGUAL_TABLET | SUBLINGUAL | Status: AC | PRN
  Administered 2017-10-27 (×3): 0.4 mg via SUBLINGUAL
  Filled 2017-10-27: qty 1

## 2017-10-27 MED ORDER — ASPIRIN 300 MG RE SUPP: 300.0000 mg | RECTAL | Status: DC

## 2017-10-27 MED ORDER — MIDAZOLAM HCL 2 MG/2ML IJ SOLN
INTRAMUSCULAR | Status: AC
  Filled 2017-10-27: qty 2

## 2017-10-27 MED ORDER — SODIUM CHLORIDE 0.9 % IV SOLN: INTRAVENOUS | Status: AC

## 2017-10-27 MED ORDER — HEPARIN SODIUM (PORCINE) 1000 UNIT/ML IJ SOLN
INTRAMUSCULAR | Status: DC | PRN
  Administered 2017-10-27 (×2): 5000 [IU] via INTRAVENOUS
  Administered 2017-10-27: 2000 [IU] via INTRAVENOUS

## 2017-10-27 MED ORDER — LIDOCAINE HCL (PF) 1 % IJ SOLN
INTRAMUSCULAR | Status: DC | PRN
  Administered 2017-10-27 (×2): 3 mL

## 2017-10-27 MED ORDER — NITROGLYCERIN 0.4 MG SL SUBL: 0.4000 mg | SUBLINGUAL_TABLET | SUBLINGUAL | Status: DC | PRN

## 2017-10-27 MED ORDER — SODIUM CHLORIDE 0.9% FLUSH: 3.0000 mL | Freq: Two times a day (BID) | INTRAVENOUS | Status: DC

## 2017-10-27 MED ORDER — HEPARIN (PORCINE) IN NACL 2-0.9 UNIT/ML-% IJ SOLN
INTRAMUSCULAR | Status: AC
  Filled 2017-10-27: qty 1000

## 2017-10-27 MED ORDER — VERAPAMIL HCL 2.5 MG/ML IV SOLN
INTRA_ARTERIAL | Status: DC | PRN
  Administered 2017-10-27: 7.5 mL via INTRA_ARTERIAL

## 2017-10-27 MED ORDER — TICAGRELOR 90 MG PO TABS
ORAL_TABLET | ORAL | Status: DC | PRN
  Administered 2017-10-27: 180 mg via ORAL

## 2017-10-27 MED ORDER — LABETALOL HCL 5 MG/ML IV SOLN: 10.0000 mg | INTRAVENOUS | Status: AC | PRN

## 2017-10-27 MED ORDER — INSULIN ASPART 100 UNIT/ML ~~LOC~~ SOLN
0.0000 [IU] | Freq: Three times a day (TID) | SUBCUTANEOUS | Status: DC
  Administered 2017-10-27: 16:00:00 4 [IU] via SUBCUTANEOUS
  Administered 2017-10-28 – 2017-10-29 (×3): 7 [IU] via SUBCUTANEOUS

## 2017-10-27 MED ORDER — HEPARIN (PORCINE) IN NACL 2-0.9 UNIT/ML-% IJ SOLN
INTRAMUSCULAR | Status: AC | PRN
  Administered 2017-10-27: 1000 mL

## 2017-10-27 MED ORDER — HEPARIN (PORCINE) IN NACL 100-0.45 UNIT/ML-% IJ SOLN: 12.0000 [IU]/kg/h | INTRAMUSCULAR | Status: DC

## 2017-10-27 MED ORDER — IOPAMIDOL (ISOVUE-370) INJECTION 76%
INTRAVENOUS | Status: DC | PRN
  Administered 2017-10-27: 130 mL via INTRAVENOUS

## 2017-10-27 MED ORDER — HEPARIN (PORCINE) IN NACL 100-0.45 UNIT/ML-% IJ SOLN
1000.0000 [IU]/h | INTRAMUSCULAR | Status: DC
  Administered 2017-10-27: 1000 [IU]/h via INTRAVENOUS
  Filled 2017-10-27: qty 250

## 2017-10-27 MED ORDER — HYDRALAZINE HCL 20 MG/ML IJ SOLN: 5.0000 mg | INTRAMUSCULAR | Status: AC | PRN

## 2017-10-27 MED ORDER — LOSARTAN POTASSIUM 50 MG PO TABS
25.0000 mg | ORAL_TABLET | Freq: Every day | ORAL | Status: DC
  Administered 2017-10-27 – 2017-10-29 (×3): 25 mg via ORAL
  Filled 2017-10-27 (×4): qty 1

## 2017-10-27 MED ORDER — ANGIOPLASTY BOOK
Freq: Once | Status: AC
  Administered 2017-10-28: 06:00:00
  Filled 2017-10-27: qty 1

## 2017-10-27 MED ORDER — ASPIRIN 81 MG PO CHEW
324.0000 mg | CHEWABLE_TABLET | Freq: Once | ORAL | Status: AC
Start: 2017-10-27 — End: 2017-10-27
  Administered 2017-10-27: 324 mg via ORAL
  Filled 2017-10-27: qty 4

## 2017-10-27 MED ORDER — SODIUM CHLORIDE 0.9% FLUSH
3.0000 mL | Freq: Two times a day (BID) | INTRAVENOUS | Status: DC
  Administered 2017-10-27 – 2017-10-28 (×3): 3 mL via INTRAVENOUS

## 2017-10-27 MED ORDER — ATORVASTATIN CALCIUM 80 MG PO TABS
80.0000 mg | ORAL_TABLET | Freq: Every day | ORAL | Status: DC
  Administered 2017-10-27 – 2017-10-28 (×2): 80 mg via ORAL
  Filled 2017-10-27 (×3): qty 1

## 2017-10-27 SURGICAL SUPPLY — 23 items
BALLN EUPHORA RX 2.0X10 (BALLOONS) ×3
BALLOON EUPHORA RX 2.0X10 (BALLOONS) ×2 IMPLANT
CATH INFINITI 5FR AL1 (CATHETERS) ×3 IMPLANT
CATH INFINITI JR4 5F (CATHETERS) ×3 IMPLANT
CATH LAUNCHER 5F JL3 (CATHETERS) ×2 IMPLANT
CATH LAUNCHER 6FR AL1 (CATHETERS) ×2 IMPLANT
CATH VISTA GUIDE 6FR JR4 (CATHETERS) ×3 IMPLANT
CATHETER LAUNCHER 5F JL3 (CATHETERS) ×3
CATHETER LAUNCHER 6FR AL1 (CATHETERS) ×3
COVER PRB 48X5XTLSCP FOLD TPE (BAG) ×2 IMPLANT
COVER PROBE 5X48 (BAG) ×1
DEVICE RAD COMP TR BAND LRG (VASCULAR PRODUCTS) ×3 IMPLANT
GLIDESHEATH SLEND A-KIT 6F 22G (SHEATH) ×3 IMPLANT
GUIDEWIRE INQWIRE 1.5J.035X260 (WIRE) ×2 IMPLANT
INQWIRE 1.5J .035X260CM (WIRE) ×3
KIT ENCORE 26 ADVANTAGE (KITS) ×3 IMPLANT
KIT HEART LEFT (KITS) ×3 IMPLANT
PACK CARDIAC CATHETERIZATION (CUSTOM PROCEDURE TRAY) ×3 IMPLANT
STENT SIERRA 2.75 X 23 MM (Permanent Stent) ×3 IMPLANT
TRANSDUCER W/STOPCOCK (MISCELLANEOUS) ×3 IMPLANT
TUBING CIL FLEX 10 FLL-RA (TUBING) ×3 IMPLANT
VALVE GUARDIAN II ~~LOC~~ HEMO (MISCELLANEOUS) ×3 IMPLANT
WIRE HI TORQ BMW 190CM (WIRE) ×3 IMPLANT

## 2017-10-27 NOTE — Care Management Note (Signed)
Case Management Note  Patient Details  Name: [name] MRN: [id number] Date of Birth: [birth year]  Subjective/Objective:                  52 year old African-American female with morbid obesity BMI 47, hypertension, hyperlipidemia, type 2 diabetes mellitus, coronary artery disease.  Patient has been having waxing and waning chest pain with minimal activity for last few days.  From home.  Action/Plan: Admit status OBSERVATION (CHEST PAIN); anticipate discharge [place].   Expected Discharge Date:       UNKNOWN           Expected Discharge Plan:  Home/Self Care   Status of Service:  In process, will continue to follow  If discussed at Long Length of Stay Meetings, dates discussed:    Additional Comments:  Fuller Mandril, RN 10/27/2017, 11:32 AM

## 2017-10-27 NOTE — H&P (Addendum)
[name] is an 52 y.o. female.   Chief Complaint: Chest pain HPI: NSTEMI  52 year old African-American female with morbid obesity BMI 47, hypertension, hyperlipidemia, type 2 diabetes mellitus, coronary artery disease status post 2.75 x 12 mm resolute drug-eluting stent to mid RCA in 2013, residual severe disease and circumflex OM, and diagonals.   Patient has been having waxing and waning chest pain with minimal activity for last few days. Earlier today, patient had chest pain at rest and relieved with nitroglycerin. Pain is left-sided, radiating to her left arm, associated with shortness of breath. EKG in the ED showed sinus rhythm with nonspecific inferior T-wave inversions. Troponin is elevated at 0.11 ng per mL. She does not have ongoing chest pain at this time.  Patient has not seen Dr. Einar Gip since 2015. Fortunately, she now has better compliance with her medications, and is able to afford medications through her insurance. Her PCP is Dr. Emilee Hero Bonsu.  Past Medical History:  Diagnosis Date  . Anemia   . CHF (congestive heart failure) ([place]) 04/2011   EF 15-20% from echo 05/19/11  . Diabetes mellitus without complication ([place])   . DOE (dyspnea on exertion)   . DVT (deep venous thrombosis) ([place]) 06/2011  . Fatigue   . HTN (hypertension)   . Menorrhagia    with iron deficient anemia  . Obesity   . Orthopnea   . Ovarian cyst     Past Surgical History:  Procedure Laterality Date  . CORONARY STENT PLACEMENT    . cyst removed from ovary    . LEFT HEART CATHETERIZATION WITH CORONARY ANGIOGRAM N/A 11/17/2011   Procedure: LEFT HEART CATHETERIZATION WITH CORONARY ANGIOGRAM;  Surgeon: Laverda Page, MD;  Location: [facility] CATH LAB;  Service: Cardiovascular;  Laterality: N/A;  . TUBAL LIGATION  2006    Family History  Problem Relation Age of Onset  . Hypertension Mother   . Stroke Father    Social History:  reports that she quit smoking about 24 years ago. Her smoking use included  cigarettes. She has a 2.50 pack-year smoking history. she has never used smokeless tobacco. She reports that she does not drink alcohol or use drugs.  Allergies:  Allergies  Allergen Reactions  . Detrol [Tolterodine Tartrate]     rash     (Not in a hospital admission)  Results for orders placed or performed during the hospital encounter of 10/26/17 (from the past 48 hour(s))  Basic metabolic panel     Status: Abnormal   Collection Time: 10/26/17  9:06 PM  Result Value Ref Range   Sodium 135 135 - 145 mmol/L   Potassium 4.1 3.5 - 5.1 mmol/L   Chloride 102 101 - 111 mmol/L   CO2 24 22 - 32 mmol/L   Glucose, Bld 323 (H) 65 - 99 mg/dL   BUN 12 6 - 20 mg/dL   Creatinine, Ser 0.85 0.44 - 1.00 mg/dL   Calcium 8.8 (L) 8.9 - 10.3 mg/dL   GFR calc non Af Amer >60 >60 mL/min   GFR calc Af Amer >60 >60 mL/min    Comment: (NOTE) The eGFR has been calculated using the CKD EPI equation. This calculation has not been validated in all clinical situations. eGFR's persistently <60 mL/min signify possible Chronic Kidney Disease.    Anion gap 9 5 - 15  CBC     Status: Abnormal   Collection Time: 10/26/17  9:06 PM  Result Value Ref Range   WBC 6.1 4.0 - 10.5  K/uL   RBC 4.43 3.87 - 5.11 MIL/uL   Hemoglobin 10.9 (L) 12.0 - 15.0 g/dL   HCT 32.8 (L) 36.0 - 46.0 %   MCV 74.0 (L) 78.0 - 100.0 fL   MCH 24.6 (L) 26.0 - 34.0 pg   MCHC 33.2 30.0 - 36.0 g/dL   RDW 13.2 11.5 - 15.5 %   Platelets 263 150 - 400 K/uL  Brain natriuretic peptide     Status: None   Collection Time: 10/26/17  9:07 PM  Result Value Ref Range   B Natriuretic Peptide 12.2 0.0 - 100.0 pg/mL  I-stat troponin, ED     Status: Abnormal   Collection Time: 10/26/17  9:27 PM  Result Value Ref Range   Troponin i, poc 0.09 (HH) 0.00 - 0.08 ng/mL   Comment NOTIFIED PHYSICIAN    Comment 3            Comment: Due to the release kinetics of cTnI, a negative result within the first hours of the onset of symptoms does not rule  out myocardial infarction with certainty. If myocardial infarction is still suspected, repeat the test at appropriate intervals.   I-Stat beta hCG blood, ED     Status: None   Collection Time: 10/26/17  9:27 PM  Result Value Ref Range   I-stat hCG, quantitative <5.0 <5 mIU/mL   Comment 3            Comment:   GEST. AGE      CONC.  (mIU/mL)   <=1 WEEK        5 - 50     2 WEEKS       50 - 500     3 WEEKS       100 - 10,000     4 WEEKS     1,000 - 30,000        FEMALE AND NON-PREGNANT FEMALE:     LESS THAN 5 mIU/mL   I-Stat Troponin, ED (not at [facility])     Status: Abnormal   Collection Time: 10/27/17 12:05 AM  Result Value Ref Range   Troponin i, poc 0.11 (HH) 0.00 - 0.08 ng/mL   Comment NOTIFIED PHYSICIAN    Comment 3            Comment: Due to the release kinetics of cTnI, a negative result within the first hours of the onset of symptoms does not rule out myocardial infarction with certainty. If myocardial infarction is still suspected, repeat the test at appropriate intervals.    Dg Chest 2 View  Result Date: 10/26/2017 CLINICAL DATA:  Left-sided chest pain. EXAM: CHEST  2 VIEW COMPARISON:  11/01/2014 FINDINGS: Cardiomediastinal silhouette is normal. Mediastinal contours appear intact. There is no evidence of focal airspace consolidation, pleural effusion or pneumothorax. Osseous structures are without acute abnormality. Soft tissues are grossly normal. IMPRESSION: No active cardiopulmonary disease. Electronically Signed   By: Fidela Salisbury M.D.   On: 10/26/2017 22:53   Ct Angio Chest Pe W And/or Wo Contrast  Result Date: 10/27/2017 CLINICAL DATA:  Increasing chest pain and shortness of breath. EXAM: CT ANGIOGRAPHY CHEST WITH CONTRAST TECHNIQUE: Multidetector CT imaging of the chest was performed using the standard protocol during bolus administration of intravenous contrast. Multiplanar CT image reconstructions and MIPs were obtained to evaluate the vascular anatomy. CONTRAST:   59 mL ISOVUE-370 IOPAMIDOL (ISOVUE-370) INJECTION 76% COMPARISON:  Radiograph yesterday.  CT 11/01/2011 FINDINGS: Cardiovascular: There are no filling defects within the pulmonary arteries  to suggest pulmonary embolus. Subsegmental branches are not well assessed due to contrast bolus timing. Thoracic aorta is normal in caliber. The heart is normal in size. No pericardial effusion. Possible Coronary artery calcifications image 72 series 6. Mediastinum/Nodes: No enlarged mediastinal or hilar lymph nodes. No axillary adenopathy. The esophagus is decompressed. No demonstrate ball thyroid nodule. Lungs/Pleura: No consolidation, pulmonary edema or pleural fluid. Mild lower lobe bronchial thickening. Slight heterogeneity of lung parenchyma at the lung bases can be seen with air trapping. No pulmonary mass. Upper Abdomen: No acute finding. Musculoskeletal: There are no acute or suspicious osseous abnormalities. Mild degenerative change in the spine. Review of the MIP images confirms the above findings. IMPRESSION: 1. No pulmonary embolus. 2. Mild lower lobe bronchial thickening with possible air trapping suggesting bronchitis or asthma. 3. Possible coronary artery calcifications. Electronically Signed   By: Jeb Levering M.D.   On: 10/27/2017 03:56    Review of Systems  Constitutional: Positive for malaise/fatigue. Negative for chills and fever.  HENT: Negative.   Eyes: Negative.   Respiratory: Positive for shortness of breath. Negative for sputum production.   Cardiovascular: Positive for chest pain. Negative for leg swelling and PND.  Gastrointestinal: Negative for abdominal pain.  Genitourinary: Negative.   Musculoskeletal: Negative.   Skin: Negative.   Neurological: Negative for dizziness and loss of consciousness.  Endo/Heme/Allergies: Does not bruise/bleed easily.  Psychiatric/Behavioral: The patient is not nervous/anxious.   All other systems reviewed and are negative.   Blood pressure (!)  123/59, pulse 76, temperature 98.1 F (36.7 C), temperature source Oral, resp. rate (!) 25, height _0  (1.575 m), weight 115.2 kg (254 lb), last menstrual period 10/06/2017, SpO2 97 %. Physical Exam  Nursing note and vitals reviewed. Constitutional: She is oriented to person, place, and time. She appears well-developed and well-nourished.  Morbidly obese  HENT:  Head: Normocephalic and atraumatic.  Eyes: Pupils are equal, round, and reactive to light.  Neck: Neck supple. No JVD present.  Cardiovascular: Normal rate, regular rhythm and normal heart sounds.  No murmur heard. Respiratory: Effort normal and breath sounds normal. She has no wheezes. She has no rales.  GI: Soft. Bowel sounds are normal. There is no tenderness.  Musculoskeletal: Normal range of motion. She exhibits no edema.  Lymphadenopathy:    She has no cervical adenopathy.  Neurological: She is alert and oriented to person, place, and time.  Skin: Skin is warm and dry.     Assessment: NSTEMI Hypertension Hyperlipidemia Type 2 diabetes mellitus Morbid obesity Anemia, possible iron deficiency. Stable  Plan: Admit to telemetry. Continue aspirin, heparin. Lipitor 80 mg. Continue home medications, amlodipine 10 mg, losartan 100 mg, coreg 25 mg bid. Hold metformin. Sliding scale insulin coverage. Suspecting multivessel disease, hold P2Y12 inhibitor at this time. Plan for cath today. Echocardiogram    [name], MD 10/27/2017, 6:16 AM  [name], MD [facility] Pager: [phone] Office: [phone] If no answer Cell [phone]

## 2017-10-27 NOTE — ED Notes (Signed)
Spoke with cardiologist-- cath will be later today-- pt to remain NPO

## 2017-10-27 NOTE — Progress Notes (Signed)
ANTICOAGULATION CONSULT NOTE - Initial Consult  Pharmacy Consult for heparin Indication: chest pain/ACS  Allergies  Allergen Reactions  . Detrol [Tolterodine Tartrate]     rash    Patient Measurements:   Heparin Dosing Weight: 78.6 kg  Vital Signs: Temp: 98.1 F (36.7 C) (01/08 2055) Temp Source: Oral (01/08 2055) BP: 136/62 (01/09 0409) Pulse Rate: 64 (01/09 0409)  Labs: Recent Labs    10/26/17 2106  HGB 10.9*  HCT 32.8*  PLT 263  CREATININE 0.85    CrCl cannot be calculated (Unknown ideal weight.).   Medical History: Past Medical History:  Diagnosis Date  . Anemia   . CHF (congestive heart failure) ([place]) 04/2011   EF 15-20% from echo 05/19/11  . Diabetes mellitus without complication ([place])   . DOE (dyspnea on exertion)   . DVT (deep venous thrombosis) ([place]) 06/2011  . Fatigue   . HTN (hypertension)   . Menorrhagia    with iron deficient anemia  . Obesity   . Orthopnea   . Ovarian cyst     Assessment: 52 yo admitted with chest pain and SOB. To start heparin gtt for rule out ACS. hgb 10.9 appears to be about at baseline  Rec'd asa in ED  Goal of Therapy:  Heparin level 0.3-0.7 units/ml Monitor platelets by anticoagulation protocol: Yes    Plan:  -Heparin 4000 units x1 then 1000 units/hr -Daily HL, CBC -Labs in AM   Harvel Quale 10/27/2017,4:29 AM

## 2017-10-27 NOTE — Progress Notes (Signed)
Mid RCA PCI. Full report to follow.  [name], MD [facility] Pager: [phone] Office: [phone] If no answer Cell [phone]

## 2017-10-27 NOTE — Interval H&P Note (Signed)
History and Physical Interval Note:  10/27/2017 12:28 PM  [name]  has presented today for surgery, with the diagnosis of NSTEMI  The various methods of treatment have been discussed with the patient and family. After consideration of risks, benefits and other options for treatment, the patient has consented to  Procedure(s): LEFT HEART CATH AND CORONARY ANGIOGRAPHY (N/A) as a surgical intervention .  The patient's history has been reviewed, patient examined, no change in status, stable for surgery.  I have reviewed the patient's chart and labs.  Questions were answered to the patient's satisfaction.    Cath Lab Visit (complete for each Cath Lab visit)  Clinical Evaluation Leading to the Procedure:   ACS: Yes.    Non-ACS:    Anginal Classification: CCS IV  Anti-ischemic medical therapy: Maximal Therapy (2 or more classes of medications)  Non-Invasive Test Results: No non-invasive testing performed  Prior CABG: No previous CABG   Arlynn Stare J Hani Campusano

## 2017-10-27 NOTE — Progress Notes (Signed)
TR BAND REMOVAL  LOCATION:    right radial  DEFLATED PER PROTOCOL:    Yes.    TIME BAND OFF / DRESSING APPLIED:    1830   SITE UPON ARRIVAL:    Level 0  SITE AFTER BAND REMOVAL:    Level 0  CIRCULATION SENSATION AND MOVEMENT:    Within Normal Limits   Yes.    COMMENTS:    

## 2017-10-27 NOTE — Progress Notes (Signed)
[employer] for heparin Indication: chest pain/ACS  Allergies  Allergen Reactions  . Detrol [Tolterodine Tartrate]     rash    Patient Measurements: Height: 5\' 2"  (157.5 cm) Weight: 254 lb (115.2 kg) IBW/kg (Calculated) : 50.1 Heparin Dosing Weight: 78.6 kg  Vital Signs: BP: 135/72 (01/09 1030) Pulse Rate: 73 (01/09 1030)  Labs: Recent Labs    10/26/17 2106 10/27/17 1112  HGB 10.9* 10.1*  HCT 32.8* 31.3*  PLT 263 266  LABPROT  --  13.3  INR  --  1.02  HEPARINUNFRC  --  0.43  CREATININE 0.85  --     Estimated Creatinine Clearance: 96.2 mL/min (by C-G formula based on SCr of 0.85 mg/dL).  Assessment: 52 yo admitted with chest pain and SOB continues on IV heparin. Initial heparin level is therapeutic at 0.43. H/H slightly low and platelets are WNL. Pt going for cardiac cath soon.   Goal of Therapy:  Heparin level 0.3-0.7 units/ml Monitor platelets by anticoagulation protocol: Yes   Plan:  Continue heparin gtt 1000 units/hr F/u post-cath  Rayvion Stumph, Rande Lawman 10/27/2017,12:00 PM

## 2017-10-27 NOTE — ED Provider Notes (Signed)
[facility] EMERGENCY DEPARTMENT Provider Note   CSN: [id number] Arrival date & time: 10/26/17  2043     History   Chief Complaint Chief Complaint  Patient presents with  . Chest Pain  . Shortness of Breath    HPI [name] is a 52 y.o. female.  The history is provided by the patient.  Chest Pain   This is a recurrent problem. The current episode started more than 2 days ago. The problem occurs constantly. The problem has not changed (waxing and waning) since onset.The pain is associated with rest. The pain is present in the lateral region. The pain is moderate. The quality of the pain is described as dull. The pain radiates to the left neck, left shoulder and left arm. Exacerbated by: none. Associated symptoms include lower extremity edema and shortness of breath. Pertinent negatives include no abdominal pain, no diaphoresis and no hemoptysis. The treatment provided no relief. Risk factors include obesity.  Her past medical history is significant for CAD and hyperlipidemia.  Procedure history is positive for cardiac catheterization.  Shortness of Breath  Associated symptoms include chest pain. Pertinent negatives include no hemoptysis and no abdominal pain. Associated medical issues include CAD.    Past Medical History:  Diagnosis Date  . Anemia   . CHF (congestive heart failure) ([place]) 04/2011   EF 15-20% from echo 05/19/11  . Diabetes mellitus without complication ([place])   . DOE (dyspnea on exertion)   . DVT (deep venous thrombosis) ([place]) 06/2011  . Fatigue   . HTN (hypertension)   . Menorrhagia    with iron deficient anemia  . Obesity   . Orthopnea   . Ovarian cyst     Patient Active Problem List   Diagnosis Date Noted  . OSA (obstructive sleep apnea) 02/21/2013  . CAD (coronary artery disease), native coronary artery 11/18/2011  . Hyperlipidemia 11/18/2011  . Chest pain syndrome 11/17/2011  . Abnormal cardiovascular stress test 11/17/2011    . DVT (deep venous thrombosis) ([place]) 11/01/2011  . Hypertension 11/01/2011  . Anemia 11/01/2011    Past Surgical History:  Procedure Laterality Date  . CORONARY STENT PLACEMENT    . cyst removed from ovary    . LEFT HEART CATHETERIZATION WITH CORONARY ANGIOGRAM N/A 11/17/2011   Procedure: LEFT HEART CATHETERIZATION WITH CORONARY ANGIOGRAM;  Surgeon: Laverda Page, MD;  Location: [facility] C[facility] LAB;  Service: Cardiovascular;  Laterality: N/A;  . TUBAL LIGATION  2006    OB History    No data available       Home Medications    Prior to Admission medications   Medication Sig Start Date End Date Taking? Authorizing Provider  amLODipine (NORVASC) 5 MG tablet Take 5 mg by mouth every evening.    Yes [provider]  aspirin 81 MG tablet Take 81 mg by mouth every morning.    Yes [provider]  atorvastatin (LIPITOR) 40 MG tablet Take 40 mg by mouth daily. 12/06/16  Yes [provider]  carvedilol (COREG) 6.25 MG tablet Take 6.25 mg by mouth every morning.    Yes [provider]  Cholecalciferol (VITAMIN D-3) 1000 UNITS CAPS Take 1 capsule by mouth daily.    Yes [provider]  Ferrous Sulfate (IRON) 325 (65 FE) MG TABS Take 1 tablet by mouth 2 (two) times daily.     Yes [provider]  furosemide (LASIX) 40 MG tablet Take 40 mg by mouth daily.    Yes  [provider]  losartan (COZAAR) 25 MG tablet Take 25 mg by mouth daily.   Yes [provider]  metFORMIN (GLUCOPHAGE) 500 MG tablet Take 500 mg by mouth 2 (two) times daily.    Yes [provider]  ondansetron (ZOFRAN ODT) 4 MG disintegrating tablet Take 1 tablet (4 mg total) by mouth every 8 (eight) hours as needed for nausea or vomiting. 02/07/17  Yes Nona Dell, PA-C    Family History Family History  Problem Relation Age of Onset  . Hypertension Mother   . Stroke Father     Social History Social History   Tobacco Use  . Smoking  status: Former Smoker    Packs/day: 0.50    Years: 5.00    Pack years: 2.50    Types: Cigarettes    Last attempt to quit: 10/19/1993    Years since quitting: 24.0  . Smokeless tobacco: Never Used  Substance Use Topics  . Alcohol use: No  . Drug use: No     Allergies   Detrol [tolterodine tartrate]   Review of Systems Review of Systems  Constitutional: Negative for diaphoresis.  Respiratory: Positive for shortness of breath. Negative for hemoptysis.   Cardiovascular: Positive for chest pain.  Gastrointestinal: Negative for abdominal pain.  All other systems reviewed and are negative.    Physical Exam Updated Vital Signs BP (!) 123/59   Pulse 76   Temp 98.1 F (36.7 C) (Oral)   Resp (!) 25   Ht 5\' 2"  (1.575 m)   Wt 115.2 kg (254 lb)   LMP 10/06/2017 (Exact Date)   SpO2 97%   BMI 46.46 kg/m   Physical Exam  Constitutional: She is oriented to person, place, and time. She appears well-developed and well-nourished. No distress.  HENT:  Head: Normocephalic and atraumatic.  Nose: Nose normal.  Mouth/Throat: No oropharyngeal exudate.  Eyes: Conjunctivae are normal. Pupils are equal, round, and reactive to light.  Neck: Normal range of motion. Neck supple. No JVD present.  Cardiovascular: Normal rate, regular rhythm, normal heart sounds and intact distal pulses.  Pulmonary/Chest: Effort normal and breath sounds normal. No stridor. She has no wheezes. She has no rales.  Abdominal: Soft. Bowel sounds are normal. She exhibits no mass. There is no tenderness. There is no rebound and no guarding.  Musculoskeletal: Normal range of motion.  Neurological: She is alert and oriented to person, place, and time. She displays normal reflexes.  Skin: Skin is warm and dry. Capillary refill takes less than 2 seconds.  Psychiatric: She has a normal mood and affect.     ED Treatments / Results  Labs (all labs ordered are listed, but only abnormal results are displayed)  Results for  orders placed or performed during the hospital encounter of 86/57/84  Basic metabolic panel  Result Value Ref Range   Sodium 135 135 - 145 mmol/L   Potassium 4.1 3.5 - 5.1 mmol/L   Chloride 102 101 - 111 mmol/L   CO2 24 22 - 32 mmol/L   Glucose, Bld 323 (H) 65 - 99 mg/dL   BUN 12 6 - 20 mg/dL   Creatinine, Ser 0.85 0.44 - 1.00 mg/dL   Calcium 8.8 (L) 8.9 - 10.3 mg/dL   GFR calc non Af Amer >60 >60 mL/min   GFR calc Af Amer >60 >60 mL/min   Anion gap 9 5 - 15  CBC  Result Value Ref Range   WBC 6.1 4.0 - 10.5 K/uL   RBC  4.43 3.87 - 5.11 MIL/uL   Hemoglobin 10.9 (L) 12.0 - 15.0 g/dL   HCT 32.8 (L) 36.0 - 46.0 %   MCV 74.0 (L) 78.0 - 100.0 fL   MCH 24.6 (L) 26.0 - 34.0 pg   MCHC 33.2 30.0 - 36.0 g/dL   RDW 13.2 11.5 - 15.5 %   Platelets 263 150 - 400 K/uL  Brain natriuretic peptide  Result Value Ref Range   B Natriuretic Peptide 12.2 0.0 - 100.0 pg/mL  I-stat troponin, ED  Result Value Ref Range   Troponin i, poc 0.09 (HH) 0.00 - 0.08 ng/mL   Comment NOTIFIED PHYSICIAN    Comment 3          I-Stat beta hCG blood, ED  Result Value Ref Range   I-stat hCG, quantitative <5.0 <5 mIU/mL   Comment 3          I-Stat Troponin, ED (not at [facility])  Result Value Ref Range   Troponin i, poc 0.11 (HH) 0.00 - 0.08 ng/mL   Comment NOTIFIED PHYSICIAN    Comment 3           Dg Chest 2 View  Result Date: 10/26/2017 CLINICAL DATA:  Left-sided chest pain. EXAM: CHEST  2 VIEW COMPARISON:  11/01/2014 FINDINGS: Cardiomediastinal silhouette is normal. Mediastinal contours appear intact. There is no evidence of focal airspace consolidation, pleural effusion or pneumothorax. Osseous structures are without acute abnormality. Soft tissues are grossly normal. IMPRESSION: No active cardiopulmonary disease. Electronically Signed   By: Fidela Salisbury M.D.   On: 10/26/2017 22:53   Ct Angio Chest Pe W And/or Wo Contrast  Result Date: 10/27/2017 CLINICAL DATA:  Increasing chest pain and shortness of breath.  EXAM: CT ANGIOGRAPHY CHEST WITH CONTRAST TECHNIQUE: Multidetector CT imaging of the chest was performed using the standard protocol during bolus administration of intravenous contrast. Multiplanar CT image reconstructions and MIPs were obtained to evaluate the vascular anatomy. CONTRAST:  59 mL ISOVUE-370 IOPAMIDOL (ISOVUE-370) INJECTION 76% COMPARISON:  Radiograph yesterday.  CT 11/01/2011 FINDINGS: Cardiovascular: There are no filling defects within the pulmonary arteries to suggest pulmonary embolus. Subsegmental branches are not well assessed due to contrast bolus timing. Thoracic aorta is normal in caliber. The heart is normal in size. No pericardial effusion. Possible Coronary artery calcifications image 72 series 6. Mediastinum/Nodes: No enlarged mediastinal or hilar lymph nodes. No axillary adenopathy. The esophagus is decompressed. No demonstrate ball thyroid nodule. Lungs/Pleura: No consolidation, pulmonary edema or pleural fluid. Mild lower lobe bronchial thickening. Slight heterogeneity of lung parenchyma at the lung bases can be seen with air trapping. No pulmonary mass. Upper Abdomen: No acute finding. Musculoskeletal: There are no acute or suspicious osseous abnormalities. Mild degenerative change in the spine. Review of the MIP images confirms the above findings. IMPRESSION: 1. No pulmonary embolus. 2. Mild lower lobe bronchial thickening with possible air trapping suggesting bronchitis or asthma. 3. Possible coronary artery calcifications. Electronically Signed   By: Jeb Levering M.D.   On: 10/27/2017 03:56    EKG  EKG Interpretation  Date/Time:  Wednesday October 27 2017 04:20:40 EST Ventricular Rate:  67 PR Interval:  146 QRS Duration: 101 QT Interval:  380 QTC Calculation: 402 R Axis:   54 Text Interpretation:  Sinus rhythm Confirmed by Dory Horn) on 10/27/2017 4:23:14 AM       Radiology Dg Chest 2 View  Result Date: 10/26/2017 CLINICAL DATA:  Left-sided chest  pain. EXAM: CHEST  2 VIEW COMPARISON:  11/01/2014 FINDINGS: Cardiomediastinal  silhouette is normal. Mediastinal contours appear intact. There is no evidence of focal airspace consolidation, pleural effusion or pneumothorax. Osseous structures are without acute abnormality. Soft tissues are grossly normal. IMPRESSION: No active cardiopulmonary disease. Electronically Signed   By: Fidela Salisbury M.D.   On: 10/26/2017 22:53   Ct Angio Chest Pe W And/or Wo Contrast  Result Date: 10/27/2017 CLINICAL DATA:  Increasing chest pain and shortness of breath. EXAM: CT ANGIOGRAPHY CHEST WITH CONTRAST TECHNIQUE: Multidetector CT imaging of the chest was performed using the standard protocol during bolus administration of intravenous contrast. Multiplanar CT image reconstructions and MIPs were obtained to evaluate the vascular anatomy. CONTRAST:  59 mL ISOVUE-370 IOPAMIDOL (ISOVUE-370) INJECTION 76% COMPARISON:  Radiograph yesterday.  CT 11/01/2011 FINDINGS: Cardiovascular: There are no filling defects within the pulmonary arteries to suggest pulmonary embolus. Subsegmental branches are not well assessed due to contrast bolus timing. Thoracic aorta is normal in caliber. The heart is normal in size. No pericardial effusion. Possible Coronary artery calcifications image 72 series 6. Mediastinum/Nodes: No enlarged mediastinal or hilar lymph nodes. No axillary adenopathy. The esophagus is decompressed. No demonstrate ball thyroid nodule. Lungs/Pleura: No consolidation, pulmonary edema or pleural fluid. Mild lower lobe bronchial thickening. Slight heterogeneity of lung parenchyma at the lung bases can be seen with air trapping. No pulmonary mass. Upper Abdomen: No acute finding. Musculoskeletal: There are no acute or suspicious osseous abnormalities. Mild degenerative change in the spine. Review of the MIP images confirms the above findings. IMPRESSION: 1. No pulmonary embolus. 2. Mild lower lobe bronchial thickening with  possible air trapping suggesting bronchitis or asthma. 3. Possible coronary artery calcifications. Electronically Signed   By: Jeb Levering M.D.   On: 10/27/2017 03:56    Procedures Procedures (including critical care time)  Medications Ordered in ED Medications  heparin bolus via infusion 4,000 Units (not administered)  heparin ADULT infusion 100 units/mL (25000 units/271mL sodium chloride 0.45%) (not administered)  aspirin chewable tablet 324 mg (324 mg Oral Given 10/27/17 0120)  iopamidol (ISOVUE-370) 76 % injection (100 mLs  Contrast Given 10/27/17 0337)  nitroGLYCERIN (NITROSTAT) SL tablet 0.4 mg (0.4 mg Sublingual Given 10/27/17 0428)    Final Clinical Impressions(s) / ED Diagnoses   Final diagnoses:  Unstable angina [facility])    Will admit to cardiology    [place], Charolette Bultman, MD 10/27/17 7628

## 2017-10-27 NOTE — ED Notes (Signed)
Patient transported to CT scan . 

## 2017-10-27 NOTE — Progress Notes (Signed)
9. [name]  [facility]  @ [employer] # [phone]    BRILINTA 90 MG BID  COVER- YES  CO-PAY- $ 35.00  TIER- 3 DRUG  PRIOR APPROVAL- NO    PREFERRED PHARMACY : [facility], [facility], [facility]  AND [facility]

## 2017-10-27 NOTE — ED Notes (Signed)
Pt has eaten oatmeal-- cath lab notified,

## 2017-10-28 ENCOUNTER — Encounter (HOSPITAL_COMMUNITY): Payer: Self-pay | Admitting: Cardiology

## 2017-10-28 ENCOUNTER — Inpatient Hospital Stay (HOSPITAL_COMMUNITY): Payer: BLUE CROSS/BLUE SHIELD

## 2017-10-28 LAB — BASIC METABOLIC PANEL
ANION GAP: 9 (ref 5–15)
BUN: 10 mg/dL (ref 6–20)
CALCIUM: 8.6 mg/dL — AB (ref 8.9–10.3)
CHLORIDE: 106 mmol/L (ref 101–111)
CO2: 21 mmol/L — AB (ref 22–32)
Creatinine, Ser: 0.71 mg/dL (ref 0.44–1.00)
GFR calc non Af Amer: >60 mL/min (ref >60)
GLUCOSE: 231 mg/dL — AB (ref 65–99)
Potassium: 3.9 mmol/L (ref 3.5–5.1)
Sodium: 136 mmol/L (ref 135–145)

## 2017-10-28 LAB — GLUCOSE, CAPILLARY
GLUCOSE-CAPILLARY: 190 mg/dL — AB (ref 65–99)
GLUCOSE-CAPILLARY: 245 mg/dL — AB (ref 65–99)
Glucose-Capillary: 212 mg/dL — ABNORMAL HIGH (ref 65–99)
Glucose-Capillary: 122 mg/dL — ABNORMAL HIGH (ref 65–99)

## 2017-10-28 LAB — ECHOCARDIOGRAM COMPLETE
HEIGHTINCHES: 62 in
Weight: 4056.46 oz

## 2017-10-28 LAB — CBC
HEMATOCRIT: 30.7 % — AB (ref 36.0–46.0)
HEMOGLOBIN: 10.1 g/dL — AB (ref 12.0–15.0)
MCH: 24.4 pg — AB (ref 26.0–34.0)
MCHC: 32.9 g/dL (ref 30.0–36.0)
MCV: 74.2 fL — AB (ref 78.0–100.0)
Platelets: 232 10*3/uL (ref 150–400)
RBC: 4.14 MIL/uL (ref 3.87–5.11)
RDW: 13.8 % (ref 11.5–15.5)
WBC: 5 10*3/uL (ref 4.0–10.5)

## 2017-10-28 LAB — LIPID PANEL
CHOL/HDL RATIO: 3.8 ratio
CHOLESTEROL: 118 mg/dL (ref 0–200)
HDL: 31 mg/dL — ABNORMAL LOW (ref >40)
LDL Cholesterol: 65 mg/dL (ref 0–99)
Triglycerides: 111 mg/dL (ref <150)
VLDL: 22 mg/dL (ref 0–40)

## 2017-10-28 LAB — TROPONIN I: TROPONIN I: 0.11 ng/mL — AB (ref <0.03)

## 2017-10-28 LAB — HIV ANTIBODY (ROUTINE TESTING W REFLEX): HIV Screen 4th Generation wRfx: NONREACTIVE

## 2017-10-28 MED ORDER — TICAGRELOR 90 MG PO TABS: 90.0000 mg | ORAL_TABLET | Freq: Two times a day (BID) | ORAL | 3 refills | Status: DC

## 2017-10-28 MED ORDER — EXERCISE FOR HEART AND HEALTH BOOK
Freq: Once | Status: AC
  Administered 2017-10-28: 19:00:00
  Filled 2017-10-28: qty 1

## 2017-10-28 MED ORDER — ISOSORBIDE MONONITRATE ER 30 MG PO TB24
15.0000 mg | ORAL_TABLET | Freq: Every day | ORAL | Status: DC
  Administered 2017-10-28 – 2017-10-29 (×2): 15 mg via ORAL
  Filled 2017-10-28 (×2): qty 1

## 2017-10-28 MED ORDER — LIVING WELL WITH DIABETES BOOK
Freq: Once | Status: AC
  Administered 2017-10-29: 06:00:00
  Filled 2017-10-28 (×2): qty 1

## 2017-10-28 MED ORDER — BLOOD PRESSURE CONTROL BOOK
Freq: Once | Status: AC
  Administered 2017-10-28: 19:00:00
  Filled 2017-10-28: qty 1

## 2017-10-28 MED ORDER — ACTIVE PARTNERSHIP FOR HEALTH OF YOUR HEART BOOK
Freq: Once | Status: AC
  Administered 2017-10-29: 06:00:00
  Filled 2017-10-28 (×2): qty 1

## 2017-10-28 MED ORDER — NITROGLYCERIN 0.4 MG SL SUBL: 0.4000 mg | SUBLINGUAL_TABLET | SUBLINGUAL | 1 refills | Status: DC | PRN

## 2017-10-28 NOTE — Progress Notes (Signed)
  Echocardiogram 2D Echocardiogram has been performed.  Autumm Hattery L Androw 10/28/2017, 8:59 AM

## 2017-10-28 NOTE — Care Management Note (Signed)
Case Management Note  Patient Details  Name: [name] MRN: [id number] Date of Birth: 06-Nov-1965  Subjective/Objective:    From home, pta indep, s/p coronary stent intervention, will be on brilinta.  She had some chest pain while ambulating with cardiac rehab.  She will not be discharged today, but will try again tomorrow.  NCM gave her the 30 day savings coupon for the brilinta and the $5 savings coupon.  [facility] on [address], NCM contacted MD and informed him to print out the [place] script so [facility] can [facility] [address] because [place] do not know how to run coupons, he stated he would.  NCM informed patient to [facility] [address], she stated she would.                 Action/Plan: NCM will follow for dc needs.  Expected Discharge Date:                  Expected Discharge Plan:  Home/Self Care  In-House Referral:     Discharge planning Services  CM Consult, Medication Assistance  Post Acute Care Choice:    Choice offered to:     DME Arranged:    DME Agency:     HH Arranged:    HH Agency:     Status of Service:  Completed, signed off  If discussed at [employer] of Stay Meetings, dates discussed:    Additional Comments:  [place], [place], RN 10/28/2017, 10:24 AM

## 2017-10-28 NOTE — Progress Notes (Signed)
CARDIAC REHAB PHASE I   PRE:  Rate/Rhythm: 78 SR    BP: sitting 145/74    SaO2:   MODE:  Ambulation: 450 ft   POST:  Rate/Rhythm: 107 ST    BP: sitting 165/85     SaO2:   Pt ambulated and talked 300 ft without problems however at 300 ft she needed to rest due to SOB and left chest "dullness". She endorses 8/10 significance. Rested for 2 min in hall then returned to room. Chest pressure/dullness decreased to 4/10 then resolved. BP elevated. Pt sts her sx on arrival to hospital were chest pain that was like an elephant sitting on her and this spread to her left arm and back. She sts it was "more than 10/10". Sts her sx today are nothing compared to that. She is comfortable during education on EOB. Discussed MI, stent, restrictions, Brilinta importance, controlling DM (long discussion of this), and CRPII. Will send referral to [place]. Encouraged her to walk this pm and we will continued discussion of exercise tomorrow. [place], ACSM 10/28/2017 10:22 AM

## 2017-10-28 NOTE — Progress Notes (Signed)
Educated patient and daughter @ bedside on insulin pen use at home. Reviewed contents of insulin flexpen starter kit. Reviewed all steps if insulin pen including attachment of needle, 2-unit air shot, dialing up dose, giving injection, removing needle, disposal of sharps, storage of unused insulin, disposal of insulin etc. Patient able to provide successful return demonstration. Also reviewed troubleshooting with insulin pen.   Thank you, Nani Gasser. Deklin Bieler, RN, MSN, CDE  Diabetes Coordinator Inpatient Glycemic Control Team Team Pager [phone] (8am-5pm) 10/28/2017 4:00 PM

## 2017-10-28 NOTE — Progress Notes (Signed)
CRITICAL VALUE ALERT  Critical value received:  Troponin 0.11   Date of notification:  10/28/17  Time of notification:  1500  Critical value read back:Yes.    Nurse who received alert:  Chales Salmon, RN   MD notified (1st page):  [place]   Time of first page:  1530  MD notified (2nd page):  Time of second page:  Responding MD:    Time MD responded:  1540

## 2017-10-28 NOTE — Progress Notes (Addendum)
Inpatient Diabetes Program Recommendations  AACE/ADA: New Consensus Statement on Inpatient Glycemic Control (2015)  Target Ranges:  Prepandial:   less than 140 mg/dL      Peak postprandial:   less than 180 mg/dL (1-2 hours)      Critically ill patients:  140 - 180 mg/dL   Lab Results  Component Value Date   GLUCAP 190 (H) 10/28/2017   HGBA1C 10.2 (H) 10/27/2017    Review of Glycemic Control Results for [name], [name] (MRN [id number]) as of 10/28/2017 15:15  Ref. Range 10/27/2017 10:51 10/27/2017 15:39 10/27/2017 22:24 10/28/2017 06:21 10/28/2017 11:29  Glucose-Capillary Latest Ref Range: 65 - 99 mg/dL 279 (H) 193 (H) 223 (H) 212 (H) 190 (H)   Diabetes history: DM2 Outpatient Diabetes medications: Metformin 500 mg bid Current orders for Inpatient glycemic control: Novolog resistant correction tid  Inpatient Diabetes Program Recommendations:    Please consider Lantus 22 units qd to start. Spoke with pt @ bedside about A1C 10.2 (average blood glucose 246 over the past 2-3 months) results with them and explained what an A1C is, basic pathophysiology of DM Type 2, basic home care, basic diabetes diet nutrition principles, importance of checking CBGs and maintaining good CBG control to prevent long-term and short-term complications. Reviewed signs and symptoms of hyperglycemia and hypoglycemia and how to treat hypoglycemia at home. Also reviewed blood sugar goals at home.  RNs to provide ongoing basic DM education at bedside with this patient. Have ordered educational booklet, and DM videos.   Patient has been drinking fruit juice and eating chips on a regular basis. Reviewed a basic plate method nutrition sheet with patient and patient agrees to decrease amount of carbohydrates and juice after D/C home. Placed order for outpatient diabetes education for cosignature per patient request.  Patient is willing to be on insulin if need be. Patient is planning to schedule appointment with PCP Ose[facility]Bonsu as  soon as possible. Patient has a glucometer and strips @ home and agrees to check CBGs and report to PCP appointments.  Thank you, Nani Gasser. Random Dobrowski, RN, MSN, CDE  Diabetes Coordinator Inpatient Glycemic Control Team Team Pager [phone] (8am-5pm) 10/28/2017 3:22 PM

## 2017-10-28 NOTE — Discharge Summary (Addendum)
Physician Discharge Summary  Patient ID: [name] MRN: [id number] DOB/AGE: 1966-08-31 52 y.o.  Admit date: 10/26/2017 Discharge date: 10/29/2017  Admission Diagnoses: Chest pain NSTEMI  Discharge Diagnoses:  Active Problems:   NSTEMI (non-ST elevated myocardial infarction) ([place]) S/p successful mid RCA PCI Hypertension Uncontrolled type 2 DM H/o blood loss anemia, currently stable Morbid obesity  Discharged Condition: stable  Hospital Course:  52 year old African-American female with hypertension, type 2 diabetes mellitus, coronary artery disease status post mid RCA PCI 2013, residual moderate to severe left circumflex and diagonal disease. Patient is admitted with waxing and waning chest pain for last 2-3 days. She is diagnosed with non-STEMI and recommended to undergo coronary angiogram and possible angioplasty.  Patient underwent successful mid RCA PCI with 2.75 x 23 mm drug-eluting stent. She has residual moderate to severe disease, that is stable. Recommend aggressive medical management including diabetes control and outpatient evaluation, perhaps with a noninvasive stress tests. Her diabetes is uncontrolled and she needs aggressive management for diabetes.. She had follow up appointment with her PCP in two weeks.   Patient did have some residual left sided chest pain that was different from her presenting pain. Given her uncontrolled hypertension and diabetes, I am reluctant to perform more PCI until she is optimally managed medially. I have started her on Imdur 15 mg. Her troponin did not increase and EKG was unremarkable. On the day of discharge, patient did not have any more chest pain on ambulation.  Consults: None  Significant Diagnostic Studies: labs:  Results for Courtney, [name] (MRN [id number]) as of 10/28/2017 09:31  Ref. Range 10/26/2017 21:27 10/27/2017 00:05 10/27/2017 11:12 10/28/2017 27:06  BASIC METABOLIC PANEL Unknown    Rpt (A)  Sodium Latest Ref Range: 135 -  145 mmol/L    136  Potassium Latest Ref Range: 3.5 - 5.1 mmol/L    3.9  Chloride Latest Ref Range: 101 - 111 mmol/L    106  CO2 Latest Ref Range: 22 - 32 mmol/L    21 (L)  Glucose Latest Ref Range: 65 - 99 mg/dL    231 (H)  Mean Plasma Glucose Latest Units: mg/dL   246.04   BUN Latest Ref Range: 6 - 20 mg/dL    10  Creatinine Latest Ref Range: 0.44 - 1.00 mg/dL    0.71  Calcium Latest Ref Range: 8.9 - 10.3 mg/dL    8.6 (L)  Anion gap Latest Ref Range: 5 - 15     9  GFR, Est Non African American Latest Ref Range: >60 mL/min    >60  GFR, Est African American Latest Ref Range: >60 mL/min    >60  Troponin i, poc Latest Ref Range: 0.00 - 0.08 ng/mL 0.09 (HH) 0.11 (HH)       Echocardiogram 10/28/2017: Study Conclusions  - Left ventricle: The cavity size was normal. Wall thickness was   normal. Systolic function was normal. The estimated ejection   fraction was in the range of 55% to 60%. Left ventricular   diastolic function parameters were normal. - Mild aortic sclerosis. No significant valvular abnormalities. - No evidence of pulmonary hypertension.   Treatments: Procedures   CORONARY STENT INTERVENTION  LEFT HEART CATH AND CORONARY ANGIOGRAPHY  Conclusion   Severe mid RCA 95% stenosis (Culprit stenosis) Successful PTCA and stent placement with 0% residual stenosis, TIMI flow II-III Residual Mid LCx, OM1, OM2, Diag 2, prox-mid RCA, Rt PDA moderate to severe disease that is unchanged compare to prior cath in 2013.  Recommendation: Aspirin 81 mg lifelong, brilinta 90 mg bid for at least 1 year. Aggressive risk factor modification and medical management. Noninvasive ischemia assessment to assess residual ischemia and guide future invasive management. This can be performed outpatient.     [id number]}  Discharge Exam: Blood pressure 122/87, pulse 79, temperature 97.9 F (36.6 C), temperature source Oral, resp. rate 18, height 5\' 2"  (1.575 m), weight 115 kg (253 lb 8.5 oz), last  menstrual period 10/06/2017, SpO2 100 %. Nursing note and vitals reviewed. Constitutional: She is oriented to person, place, and time. She appears well-developed and well-nourished.  Morbidly obese  HENT:  Head: Normocephalic and atraumatic.  Eyes: Pupils are equal, round, and reactive to light.  Neck: Neck supple. No JVD present.  Cardiovascular: Normal rate, regular rhythm and normal heart sounds.  Right radial site with no bleeding, hematoma. Good capillary refill.  No murmur heard. Respiratory: Effort normal and breath sounds normal. She has no wheezes. She has no rales.  GI: Soft. Bowel sounds are normal. There is no tenderness.  Musculoskeletal: Normal range of motion. She exhibits no edema.  Lymphadenopathy:    She has no cervical adenopathy.  Neurological: She is alert and oriented to person, place, and time.  Skin: Skin is warm and dry.     Disposition: 01-Home or Self Care   Allergies as of 10/29/2017      Reactions   Detrol [tolterodine Tartrate]    rash      Medication List    TAKE these medications   amLODipine 5 MG tablet Commonly known as:  NORVASC Take 5 mg by mouth every evening. Notes to patient:  Lowers blood pressure Increases blood flow to heart    aspirin 81 MG tablet Take 81 mg by mouth every morning. Notes to patient:  Prevents clotting in the stent and heart attack   atorvastatin 40 MG tablet Commonly known as:  LIPITOR Take 40 mg by mouth daily. Notes to patient:  Cholesterol    carvedilol 25 MG tablet Commonly known as:  COREG Take 1 tablet (25 mg total) by mouth 2 (two) times daily. What changed:    medication strength  how much to take  when to take this   furosemide 40 MG tablet Commonly known as:  LASIX Take 40 mg by mouth daily. Notes to patient:  Fluid pill    Iron 325 (65 Fe) MG Tabs Take 1 tablet by mouth 2 (two) times daily. Notes to patient:  Supplement    isosorbide mononitrate 30 MG 24 hr tablet Commonly known  as:  IMDUR Take 0.5 tablets (15 mg total) by mouth daily. Notes to patient:  Lowers blood pressure Increases blood flow to the heart    losartan 50 MG tablet Commonly known as:  COZAAR Take 1 tablet (50 mg total) by mouth daily. What changed:    medication strength  how much to take   metFORMIN 500 MG tablet Commonly known as:  GLUCOPHAGE Take 500 mg by mouth 2 (two) times daily. Notes to patient:  Controls blood sugar Metabolized through kidneys and should be held for 48 hours after given dye. Dye stressed the kidneys and dye was given during the cardiac cath.    nitroGLYCERIN 0.4 MG SL tablet Commonly known as:  NITROSTAT Place 1 tablet (0.4 mg total) under the tongue every 5 (five) minutes x 3 doses as needed for chest pain. Notes to patient:  As needed for chest pain   ondansetron 4 MG disintegrating tablet Commonly  known as:  ZOFRAN ODT Take 1 tablet (4 mg total) by mouth every 8 (eight) hours as needed for nausea or vomiting. Notes to patient:  As needed for nausea/vomiting    ticagrelor 90 MG Tabs tablet Commonly known as:  BRILINTA Take 1 tablet (90 mg total) by mouth 2 (two) times daily. Notes to patient:  Blood thinner Prevents clotting in the stent and heart attack   Vitamin D-3 1000 units Caps Take 1 capsule by mouth daily. Notes to patient:  Supplement          [name], MD [name] [place] Pager: [phone] Office: [phone] If no answer Cell [phone]

## 2017-10-28 NOTE — Progress Notes (Signed)
Subjective:  Episode of chest pain on walking today.   Objective:  Vital Signs in the last 24 hours: Temp:  [97.5 F (36.4 C)-98.4 F (36.9 C)] 97.9 F (36.6 C) (01/10 0700) Pulse Rate:  [0-81] 79 (01/10 0700) Resp:  [0-22] 16 (01/10 0945) BP: (97-182)/(51-92) 165/85 (01/10 0945) SpO2:  [0 %-100 %] 100 % (01/10 0700) Weight:  [115 kg (253 lb 8.5 oz)] 115 kg (253 lb 8.5 oz) (01/10 0340)  Intake/Output from previous day: 01/09 0701 - 01/10 0700 In: 240 [P.O.:240] Out: 2500 [Urine:2500] Intake/Output from this shift: Total I/O In: 120 [P.O.:120] Out: -   Physical Exam: Nursing note and vitals reviewed. Constitutional: She is oriented to person, place, and time. She appears well-developed and well-nourished.  Morbidly obese  HENT:  Head: Normocephalic and atraumatic.  Eyes: Pupils are equal, round, and reactive to light.  Neck: Neck supple. No JVD present.  Cardiovascular: Normal rate, regular rhythm and normal heart sounds.  No murmur heard. Right radial site with no bleeding, hematoma. Good capillary refill.  Respiratory: Effort normal and breath sounds normal. She has no wheezes. She has no rales.  GI: Soft. Bowel sounds are normal. There is no tenderness.  Musculoskeletal: Normal range of motion. She exhibits no edema.  Lymphadenopathy:    She has no cervical adenopathy.  Neurological: She is alert and oriented to person, place, and time.  Skin: Skin is warm and dry.     Lab Results: Recent Labs    10/27/17 1112 10/28/17 0310  WBC 5.6 5.0  HGB 10.1* 10.1*  PLT 266 232   Recent Labs    10/26/17 2106 10/28/17 0310  NA 135 136  K 4.1 3.9  CL 102 106  CO2 24 21*  GLUCOSE 323* 231*  BUN 12 10  CREATININE 0.85 0.71    Cardiac Studies: Echocardiogram 10/28/2017: Left ventricle:  The cavity size was normal. Wall thickness was normal. Systolic function was normal. The estimated ejection fraction was in the range of 55% to 60%. The transmitral flow pattern  was normal. The deceleration time of the early transmitral flow velocity was normal. The pulmonary vein flow pattern was normal. The tissue Doppler parameters were normal. Left ventricular diastolic function parameters were normal.  Assessment: NSTEMI, s/p mid RCA PCI. Residual moderate to severe multivessel disease. Hypertension Hyperlipidemia Type 2 diabetes mellitus Morbid obesity Anemia, possible iron deficiency. Stable  Plan: 81 mg aspirin lifelong, Brilinta 90 mg b.i.d. at least for 1 year.  Continue Lipitor 80 mg, amlodipine 10, losartan 100, carvedilol 25 mg b.i.d.  Started on Imdur 15 mg, uptitrate as tolerated. Chest pain that brought the patient to the hospital yesterday is resolved.  However, she did have a different type of left-sided pain when she walks associated with shortness of breath.  Pain has since resolved.  We'll keep her overnight today.  I will check an EKG and troponin to make sure no ongoing ischemia.  Asian has multiple areas of moderate to severe stenosis that is stable compared to her catheter in 2013.  The culprit mid RCA stenosis has been successfully treated with PCI.  Even her uncontrolled hypertension and diabetes, I would rather optimize medical maangement before performing further PCI.  Echocardiogram with preserved LVEF Continue sliding scale insulin for now. Needs aggressive management of her diabetes.    LOS: 1 day     Lynsie Mcwatters J Cristina Ceniceros 10/28/2017, 11:26 AM   [place], MD [facility] [place] Pager: [phone] Office: [phone] If no answer  Cell [phone]

## 2017-10-29 LAB — BASIC METABOLIC PANEL
Anion gap: 8 (ref 5–15)
BUN: 11 mg/dL (ref 6–20)
CALCIUM: 9.1 mg/dL (ref 8.9–10.3)
CO2: 21 mmol/L — ABNORMAL LOW (ref 22–32)
Chloride: 107 mmol/L (ref 101–111)
Creatinine, Ser: 0.81 mg/dL (ref 0.44–1.00)
GFR calc Af Amer: >60 mL/min (ref >60)
GLUCOSE: 218 mg/dL — AB (ref 65–99)
Potassium: 3.9 mmol/L (ref 3.5–5.1)
Sodium: 136 mmol/L (ref 135–145)

## 2017-10-29 LAB — CBC
HCT: 31.1 % — ABNORMAL LOW (ref 36.0–46.0)
Hemoglobin: 10 g/dL — ABNORMAL LOW (ref 12.0–15.0)
MCH: 23.5 pg — ABNORMAL LOW (ref 26.0–34.0)
MCHC: 32.2 g/dL (ref 30.0–36.0)
MCV: 73.2 fL — ABNORMAL LOW (ref 78.0–100.0)
Platelets: 232 10*3/uL (ref 150–400)
RBC: 4.25 MIL/uL (ref 3.87–5.11)
RDW: 13.4 % (ref 11.5–15.5)
WBC: 5.4 10*3/uL (ref 4.0–10.5)

## 2017-10-29 LAB — GLUCOSE, CAPILLARY: GLUCOSE-CAPILLARY: 211 mg/dL — AB (ref 65–99)

## 2017-10-29 MED ORDER — ISOSORBIDE MONONITRATE ER 30 MG PO TB24: 15.0000 mg | ORAL_TABLET | Freq: Every day | ORAL | 2 refills | Status: DC

## 2017-10-29 NOTE — Progress Notes (Signed)
CARDIAC REHAB PHASE I   PRE:  Rate/Rhythm: 78 SR    BP: sitting 155/92    SaO2:   MODE:  Ambulation: 1000 ft   POST:  Rate/Rhythm: 120 ST    BP: sitting 166/101     SaO2:   Tolerated well, feels great, quick pace. HR and BP elevated but pt without c/o, no chest pressure. Pt got more DM ed yesterday and has books to read at home. Discussed importance of exercise and steps per day, NTG, and CRPII. Good reception, pt sounds motivated. [place], ACSM 10/29/2017 8:50 AM

## 2017-11-03 MED ORDER — CARVEDILOL 25 MG PO TABS: 25.0000 mg | ORAL_TABLET | Freq: Two times a day (BID) | ORAL | 11 refills | Status: DC

## 2017-11-03 MED ORDER — LOSARTAN POTASSIUM 50 MG PO TABS: 50.0000 mg | ORAL_TABLET | Freq: Every day | ORAL | 11 refills | Status: DC

## 2017-11-05 ENCOUNTER — Telehealth (HOSPITAL_COMMUNITY): Payer: Self-pay

## 2017-11-05 NOTE — Telephone Encounter (Signed)
Patients insurance is active and benefits verified through [facility] - No co-pay, deductible amount of $500.00/$0.00 has been met, out of pocket amount of $1,500/$79.00 has been met, 20% co-insurance, and no pre-authorization is required. Spoke with [employer] - Reference [id number]  Patient is ready to be contacted and scheduled.

## 2017-11-08 ENCOUNTER — Telehealth (HOSPITAL_COMMUNITY): Payer: Self-pay

## 2017-11-08 NOTE — Telephone Encounter (Signed)
Attempted to call patient in regards to Cardiac Rehab - lm on vm °

## 2017-11-10 ENCOUNTER — Telehealth (HOSPITAL_COMMUNITY): Payer: Self-pay

## 2017-11-10 NOTE — Telephone Encounter (Signed)
Patient returned phone call in regards to cardiac rehab - patient is interested in the program. Scheduled orientation on 12/16/2017 at 8:45am. Patient will attend the 2:45pm exc class.

## 2017-12-13 ENCOUNTER — Telehealth (HOSPITAL_COMMUNITY): Payer: Self-pay

## 2017-12-15 ENCOUNTER — Telehealth (HOSPITAL_COMMUNITY): Payer: Self-pay

## 2017-12-15 NOTE — Telephone Encounter (Signed)
Cardiac Rehab Medication Review by a Pharmacist  Does the patient feel that his/her medications are working for him/her?  yes  Has the patient been experiencing any side effects to the medications prescribed?  no  Does the patient measure his/her own blood pressure or blood glucose at home?  yes - says everything has been looking good but is running low on lancets.   Does the patient have any problems obtaining medications due to transportation or finances?   no  Understanding of regimen: good Understanding of indications: good Potential of compliance: good   Pharmacist comments: I am concerned that the patient is actually taking amlodipine 10 mg once daily and metformin 1,000 mg twice daily as that is what her reconciled drugs are pulling from the [facility] on [address]. The patient states she is taking amlodipine 5 mg daily and metformin 500 mg twice daily.   Diana L. Kyung Rudd, PharmD, [place] PGY1 Pharmacy Resident Pager: [phone]

## 2017-12-16 ENCOUNTER — Encounter (HOSPITAL_COMMUNITY): Payer: Self-pay

## 2017-12-16 ENCOUNTER — Encounter (HOSPITAL_COMMUNITY)
Admission: RE | Admit: 2017-12-16 | Discharge: 2017-12-16 | Disposition: A | Discharge status: Home / Self Care | Payer: BLUE CROSS/BLUE SHIELD | Source: Ambulatory Visit | Attending: Cardiology | Admitting: Cardiology

## 2017-12-16 VITALS — Ht 62.0 in | Wt 251.5 lb

## 2017-12-16 DIAGNOSIS — Z7984 Long term (current) use of oral hypoglycemic drugs: Secondary | ICD-10-CM | POA: Diagnosis not present

## 2017-12-16 DIAGNOSIS — Z79899 Other long term (current) drug therapy: Secondary | ICD-10-CM | POA: Insufficient documentation

## 2017-12-16 DIAGNOSIS — I110 Hypertensive heart disease with heart failure: Secondary | ICD-10-CM | POA: Diagnosis not present

## 2017-12-16 DIAGNOSIS — Z6841 Body Mass Index (BMI) 40.0 and over, adult: Secondary | ICD-10-CM | POA: Insufficient documentation

## 2017-12-16 DIAGNOSIS — D509 Iron deficiency anemia, unspecified: Secondary | ICD-10-CM | POA: Diagnosis not present

## 2017-12-16 DIAGNOSIS — Z7982 Long term (current) use of aspirin: Secondary | ICD-10-CM | POA: Insufficient documentation

## 2017-12-16 DIAGNOSIS — Z86718 Personal history of other venous thrombosis and embolism: Secondary | ICD-10-CM | POA: Diagnosis not present

## 2017-12-16 DIAGNOSIS — E669 Obesity, unspecified: Secondary | ICD-10-CM | POA: Insufficient documentation

## 2017-12-16 DIAGNOSIS — I252 Old myocardial infarction: Secondary | ICD-10-CM | POA: Diagnosis present

## 2017-12-16 DIAGNOSIS — Z87891 Personal history of nicotine dependence: Secondary | ICD-10-CM | POA: Insufficient documentation

## 2017-12-16 DIAGNOSIS — Z955 Presence of coronary angioplasty implant and graft: Secondary | ICD-10-CM | POA: Diagnosis not present

## 2017-12-16 DIAGNOSIS — Z7902 Long term (current) use of antithrombotics/antiplatelets: Secondary | ICD-10-CM | POA: Insufficient documentation

## 2017-12-16 DIAGNOSIS — E119 Type 2 diabetes mellitus without complications: Secondary | ICD-10-CM | POA: Diagnosis not present

## 2017-12-16 DIAGNOSIS — I509 Heart failure, unspecified: Secondary | ICD-10-CM | POA: Diagnosis not present

## 2017-12-16 DIAGNOSIS — I214 Non-ST elevation (NSTEMI) myocardial infarction: Secondary | ICD-10-CM

## 2017-12-16 NOTE — Progress Notes (Signed)
[name] 52 y.o. female DOB: 1966/06/05 MRN: [id number]      Nutrition Note  1. NSTEMI (non-ST elevated myocardial infarction) ([place])   2. Status post coronary artery stent placement    Past Medical History:  Diagnosis Date  . Anemia   . CHF (congestive heart failure) ([place]) 04/2011   EF 15-20% from echo 05/19/11  . DOE (dyspnea on exertion)   . DVT (deep venous thrombosis) ([place]) 06/2011   LLE  . Fatigue   . HTN (hypertension)   . Menorrhagia    with iron deficient anemia  . NSTEMI (non-ST elevated myocardial infarction) ([place]) 10/26/2017  . NSTEMI (non-ST elevated myocardial infarction) ([place]) 10/27/2017  . Obesity   . Orthopnea   . Type II diabetes mellitus ([place])    Meds reviewed. Metformin noted  HT: Ht Readings from Last 1 Encounters:  12/16/17 5\' 2"  (1.575 m)    WT: Wt Readings from Last 3 Encounters:  12/16/17 251 lb 8.7 oz (114.1 kg)  10/29/17 255 lb 8.2 oz (115.9 kg)  02/07/17 225 lb (102.1 kg)     BMI 46   Current tobacco use? No   Labs:  Lipid Panel     Component Value Date/Time   CHOL 118 10/28/2017 1402   TRIG 111 10/28/2017 1402   HDL 31 (L) 10/28/2017 1402   CHOLHDL 3.8 10/28/2017 1402   VLDL 22 10/28/2017 1402   LDLCALC 65 10/28/2017 1402    Lab Results  Component Value Date   HGBA1C 10.2 (H) 10/27/2017   CBG (last 3)  No results for input(s): GLUCAP in the last 72 hours.  Nutrition Note Spoke with pt. Nutrition plan and goals reviewed with pt. Pt is following Step 2 of the Therapeutic Lifestyle Changes diet. Pt wants to lose wt. Pt has been trying to lose wt. Wt loss tips reviewed. Pt is diabetic. Last A1c indicates blood glucose poorly controlled. Pt tries to check CBG's once a day. Fasting CBG's reportedly "179 mg/dL is the highest it's been." Pt expressed understanding of the information reviewed. Pt aware of nutrition education classes offered and is unable to attend nutrition classes due to work.  Nutrition Diagnosis ? Food-and  nutrition-related knowledge deficit related to lack of exposure to information as related to diagnosis of: ? CVD ? DM ? Obesity related to excessive energy intake as evidenced by a BMI of 46  Nutrition Intervention ? Pt's individual nutrition plan and goals reviewed with pt. ? Pt given handouts for: ? Nutrition I class ? Nutrition II class ? Diabetes Blitz Class   Nutrition Goal(s):  ? Pt to identify food quantities necessary to achieve weight loss of 6-24 lb (2.7-10.9 kg) at graduation from cardiac rehab. ? Improved blood glucose control as evidenced by pt's A1c trending from 10.2 toward less than 7.0.  Plan:  Pt to attend nutrition classes ? Portion Distortion ? Diabetes Q & A Will provide client-centered nutrition education as part of interdisciplinary care.   Monitor and evaluate progress toward nutrition goal with team.  Derek [place], M.Ed, RD, LDN, CDE 12/16/2017 12:09 PM

## 2017-12-16 NOTE — Progress Notes (Signed)
Cardiac Individual Treatment Plan  Patient Details  Name: [name] MRN: [id number] Date of Birth: 31-Jul-1966 Referring Provider:     [place] from 12/16/2017 in [place]  Referring Provider  [name] MD      Initial Encounter Date:    CARDIAC REHAB PHASE II ORIENTATION from 12/16/2017 in [place]  Date  12/16/17  Referring Provider  [name] MD      Visit Diagnosis: NSTEMI (non-ST elevated myocardial infarction) [facility])  Status post coronary artery stent placement  Patient's Home Medications on Admission:  Current Outpatient Medications:  .  amLODipine (NORVASC) 5 MG tablet, Take 5 mg by mouth every evening. , Disp: , Rfl:  .  aspirin 81 MG tablet, Take 81 mg by mouth every morning. , Disp: , Rfl:  .  atorvastatin (LIPITOR) 40 MG tablet, Take 40 mg by mouth daily., Disp: , Rfl: 1 .  carvedilol (COREG) 25 MG tablet, Take 1 tablet (25 mg total) by mouth 2 (two) times daily., Disp: 60 tablet, Rfl: 11 .  Cholecalciferol (VITAMIN D-3) 1000 UNITS CAPS, Take 1 capsule by mouth daily. , Disp: , Rfl:  .  Ferrous Sulfate (IRON) 325 (65 FE) MG TABS, Take 1 tablet by mouth 2 (two) times daily.  , Disp: , Rfl:  .  furosemide (LASIX) 40 MG tablet, Take 40 mg by mouth daily. , Disp: , Rfl:  .  isosorbide mononitrate (IMDUR) 30 MG 24 hr tablet, Take 0.5 tablets (15 mg total) by mouth daily., Disp: 60 tablet, Rfl: 2 .  losartan (COZAAR) 50 MG tablet, Take 1 tablet (50 mg total) by mouth daily., Disp: 30 tablet, Rfl: 11 .  metFORMIN (GLUCOPHAGE) 500 MG tablet, Take 500 mg by mouth 2 (two) times daily. , Disp: , Rfl:  .  nitroGLYCERIN (NITROSTAT) 0.4 MG SL tablet, Place 1 tablet (0.4 mg total) under the tongue every 5 (five) minutes x 3 doses as needed for chest pain. (Patient not taking: Reported on 12/15/2017), Disp: 30 tablet, Rfl: 1 .  ondansetron (ZOFRAN ODT) 4 MG  disintegrating tablet, Take 1 tablet (4 mg total) by mouth every 8 (eight) hours as needed for nausea or vomiting., Disp: 10 tablet, Rfl: 0 .  ticagrelor (BRILINTA) 90 MG TABS tablet, Take 1 tablet (90 mg total) by mouth 2 (two) times daily., Disp: 60 tablet, Rfl: 3  Past Medical History: Past Medical History:  Diagnosis Date  . Anemia   . CHF (congestive heart failure) ([place]) 04/2011   EF 15-20% from echo 05/19/11  . DOE (dyspnea on exertion)   . DVT (deep venous thrombosis) ([place]) 06/2011   LLE  . Fatigue   . HTN (hypertension)   . Menorrhagia    with iron deficient anemia  . NSTEMI (non-ST elevated myocardial infarction) ([place]) 10/26/2017  . NSTEMI (non-ST elevated myocardial infarction) ([place]) 10/27/2017  . Obesity   . Orthopnea   . Type II diabetes mellitus ([facility])     Tobacco Use: Social History   Tobacco Use  Smoking Status Former Smoker  . Packs/day: 0.50  . Years: 5.00  . Pack years: 2.50  . Types: Cigarettes  . Last attempt to quit: 10/19/1993  . Years since quitting: 24.1  Smokeless Tobacco Never Used    Labs: Recent Chemical engineer    Labs for ITP Cardiac and Pulmonary Rehab Latest Ref Rng & Units 09/26/2011 04/15/2012 05/20/2013 10/27/2017 10/28/2017   Cholestrol  0 - 200 mg/dL - - - - 118   LDLCALC 0 - 99 mg/dL - - - - 65   HDL >40 mg/dL - - - - 31(L)   Trlycerides <150 mg/dL - - - - 111   Hemoglobin A1c 4.8 - 5.6 % - - - 10.2(H) -   TCO2 0 - 100 mmol/L 26 24 29  - -      Capillary Blood Glucose: Lab Results  Component Value Date   GLUCAP 211 (H) 10/29/2017   GLUCAP 122 (H) 10/28/2017   GLUCAP 245 (H) 10/28/2017   GLUCAP 190 (H) 10/28/2017   GLUCAP 212 (H) 10/28/2017     Exercise Target Goals: Date: 12/16/17  Exercise Program Goal: Individual exercise prescription set using results from initial 6 min walk test and THRR while considering  patient's activity barriers and safety.   Exercise Prescription Goal: Initial exercise prescription builds to 30-45  minutes a day of aerobic activity, 2-3 days per week.  Home exercise guidelines will be given to patient during program as part of exercise prescription that the participant will acknowledge.  Activity Barriers & Risk Stratification: Activity Barriers & Cardiac Risk Stratification - 12/16/17 0946      Activity Barriers & Cardiac Risk Stratification   Activity Barriers  Balance Concerns;Muscular Weakness;Deconditioning;Other (comment)    Comments  occ. L knee pain/discomfort    Cardiac Risk Stratification  High       6 Minute Walk: 6 Minute Walk    Row Name 12/16/17 1134         6 Minute Walk   Phase  Initial     Distance  1539 feet     Walk Time  6 minutes     # of Rest Breaks  0     MPH  2.91     METS  3.7     RPE  7     Perceived Dyspnea   0     VO2 Peak  13.1     Symptoms  No     Resting HR  86 bpm     Resting BP  104/62     Resting Oxygen Saturation   99 %     Exercise Oxygen Saturation  during 6 min walk  99 %     Max Ex. HR  136 bpm     Max Ex. BP  134/70     2 Minute Post BP  118/80        Oxygen Initial Assessment:   Oxygen Re-Evaluation:   Oxygen Discharge (Final Oxygen Re-Evaluation):   Initial Exercise Prescription: Initial Exercise Prescription - 12/16/17 1100      Date of Initial Exercise RX and Referring Provider   Date  12/16/17    Referring Provider  [name] MD      Treadmill   MPH  2    Grade  1    Minutes  10    METs  2.81      NuStep   Level  3    SPM  70    Minutes  10    METs  2.3      Arm Ergometer   Level  2    Watts  30    Minutes  10    METs  2.5      Prescription Details   Frequency (times per week)  3x    Duration  Progress to 30 minutes of continuous aerobic without signs/symptoms of physical distress  Intensity   THRR 40-80% of Max Heartrate  68-137    Ratings of Perceived Exertion  11-13    Perceived Dyspnea  0-4      Progression   Progression  Continue progressive overload as per policy  without signs/symptoms or physical distress.      Resistance Training   Training Prescription  Yes    Weight  3lb    Reps  10-15       Perform Capillary Blood Glucose checks as needed.  Exercise Prescription Changes:   Exercise Comments:   Exercise Goals and Review: Exercise Goals    Row Name 12/16/17 0946             Exercise Goals   Increase Physical Activity  Yes       Intervention  Provide advice, education, support and counseling about physical activity/exercise needs.;Develop an individualized exercise prescription for aerobic and resistive training based on initial evaluation findings, risk stratification, comorbidities and participant's personal goals.       Expected Outcomes  Short Term: Attend rehab on a regular basis to increase amount of physical activity.;Long Term: Exercising regularly at least 3-5 days a week.;Long Term: Add in home exercise to make exercise part of routine and to increase amount of physical activity.       Increase Strength and Stamina  Yes       Intervention  Provide advice, education, support and counseling about physical activity/exercise needs.;Develop an individualized exercise prescription for aerobic and resistive training based on initial evaluation findings, risk stratification, comorbidities and participant's personal goals.       Expected Outcomes  Short Term: Increase workloads from initial exercise prescription for resistance, speed, and METs.;Short Term: Perform resistance training exercises routinely during rehab and add in resistance training at home;Long Term: Improve cardiorespiratory fitness, muscular endurance and strength as measured by increased METs and functional capacity (6MWT)       Able to understand and use rate of perceived exertion (RPE) scale  Yes       Intervention  Provide education and explanation on how to use RPE scale       Expected Outcomes  Short Term: Able to use RPE daily in rehab to express subjective  intensity level;Long Term:  Able to use RPE to guide intensity level when exercising independently       Knowledge and understanding of Target Heart Rate Range (THRR)  Yes       Intervention  Provide education and explanation of THRR including how the numbers were predicted and where they are located for reference       Expected Outcomes  Short Term: Able to state/look up THRR;Long Term: Able to use THRR to govern intensity when exercising independently;Short Term: Able to use daily as guideline for intensity in rehab       Able to check pulse independently  Yes       Intervention  Provide education and demonstration on how to check pulse in carotid and radial arteries.;Review the importance of being able to check your own pulse for safety during independent exercise       Expected Outcomes  Short Term: Able to explain why pulse checking is important during independent exercise;Long Term: Able to check pulse independently and accurately       Understanding of Exercise Prescription  Yes       Intervention  Provide education, explanation, and written materials on patient's individual exercise prescription       Expected Outcomes  Short Term: Able to explain program exercise prescription;Long Term: Able to explain home exercise prescription to exercise independently          Exercise Goals Re-Evaluation :    Discharge Exercise Prescription (Final Exercise Prescription Changes):   Nutrition:  Target Goals: Understanding of nutrition guidelines, daily intake of sodium 1500mg , cholesterol 200mg , calories 30% from fat and 7% or less from saturated fats, daily to have 5 or more servings of fruits and vegetables.  Biometrics: Pre Biometrics - 12/16/17 1140      Pre Biometrics   Height  5\' 2"  (1.575 m)        Nutrition Therapy Plan and Nutrition Goals: Nutrition Therapy & Goals - 12/16/17 1219      Nutrition Therapy   Diet  Carb Modified, Heart Healthy      Personal Nutrition Goals    Nutrition Goal  Pt to identify food quantities necessary to achieve weight loss of 6-24 lb (2.7-10.9 kg) at graduation from cardiac rehab.    Personal Goal #2  Improved blood glucose control as evidenced by pt's A1c trending from 10.2 toward less than 7.0.      Intervention Plan   Intervention  Prescribe, educate and counsel regarding individualized specific dietary modifications aiming towards targeted core components such as weight, hypertension, lipid management, diabetes, heart failure and other comorbidities.    Expected Outcomes  Short Term Goal: Understand basic principles of dietary content, such as calories, fat, sodium, cholesterol and nutrients.;Long Term Goal: Adherence to prescribed nutrition plan.       Nutrition Assessments: Nutrition Assessments - 12/16/17 1219      MEDFICTS Scores   Pre Score  9       Nutrition Goals Re-Evaluation:   Nutrition Goals Re-Evaluation:   Nutrition Goals Discharge (Final Nutrition Goals Re-Evaluation):   Psychosocial: Target Goals: Acknowledge presence or absence of significant depression and/or stress, maximize coping skills, provide positive support system. Participant is able to verbalize types and ability to use techniques and skills needed for reducing stress and depression.  Initial Review & Psychosocial Screening: Initial Psych Review & Screening - 12/16/17 1236      Initial Review   Current issues with  None Identified      Family Dynamics   Good Support System?  Yes    Comments  Pt is very excited to learn more on how to live a heart healty lifestyle.      Barriers   Psychosocial barriers to participate in program  The patient should benefit from training in stress management and relaxation.;There are no identifiable barriers or psychosocial needs.      Screening Interventions   Interventions  Encouraged to exercise    Expected Outcomes  Short Term goal: Identification and review with participant of any Quality of Life  or Depression concerns found by scoring the questionnaire.;Long Term goal: The participant improves quality of Life and PHQ9 Scores as seen by post scores and/or verbalization of changes       Quality of Life Scores: Quality of Life - 12/16/17 1139      Quality of Life Scores   Health/Function Pre  28.96 %    Socioeconomic Pre  28.29 %    Psych/Spiritual Pre  30 %    Family Pre  28.5 %    GLOBAL Pre  28.98 %      Scores of 19 and below usually indicate a poorer quality of life in these areas.  A difference of  2-3 points is  a clinically meaningful difference.  A difference of 2-3 points in the total score of the Quality of Life Index has been associated with significant improvement in overall quality of life, self-image, physical symptoms, and general health in studies assessing change in quality of life.  PHQ-9: Recent Review Flowsheet Data    There is no flowsheet data to display.     Interpretation of Total Score  Total Score Depression Severity:  1-4 = Minimal depression, 5-9 = Mild depression, 10-14 = Moderate depression, 15-19 = Moderately severe depression, 20-27 = Severe depression   Psychosocial Evaluation and Intervention:   Psychosocial Re-Evaluation:   Psychosocial Discharge (Final Psychosocial Re-Evaluation):   Vocational Rehabilitation: Provide vocational rehab assistance to qualifying candidates.   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       Education: Education Goals: Education classes will be provided on a weekly basis, covering required topics. Participant will state understanding/return demonstration of topics presented.  Learning Barriers/Preferences: Learning Barriers/Preferences - 12/16/17 0946      Learning  Barriers/Preferences   Learning Barriers  Sight    Learning Preferences  Skilled Demonstration;Verbal Instruction;Video;Written Material       Education Topics: Count Your Pulse:  -Group instruction provided by verbal instruction, demonstration, patient participation and written materials to support subject.  Instructors address importance of being able to find your pulse and how to count your pulse when at home without a heart monitor.  Patients get hands on experience counting their pulse with staff help and individually.   Heart Attack, Angina, and Risk Factor Modification:  -Group instruction provided by verbal instruction, video, and written materials to support subject.  Instructors address signs and symptoms of angina and heart attacks.    Also discuss risk factors for heart disease and how to make changes to improve heart health risk factors.   Functional Fitness:  -Group instruction provided by verbal instruction, demonstration, patient participation, and written materials to support subject.  Instructors address safety measures for doing things around the house.  Discuss how to get up and down off the floor, how to pick things up properly, how to safely get out of a chair without assistance, and balance training.   Meditation and Mindfulness:  -Group instruction provided by verbal instruction, patient participation, and written materials to support subject.  Instructor addresses importance of mindfulness and meditation practice to help reduce stress and improve awareness.  Instructor also leads participants through a meditation exercise.    Stretching for Flexibility and Mobility:  -Group instruction provided by verbal instruction, patient participation, and written materials to support subject.  Instructors lead participants through series of stretches that are designed to increase flexibility thus improving mobility.  These stretches are additional exercise for major muscle groups  that are typically performed during regular warm up and cool down.   Hands Only CPR:  -Group verbal, video, and participation provides a basic overview of AHA guidelines for community CPR. Role-play of emergencies allow participants the opportunity to practice calling for help and chest compression technique with discussion of AED use.   Hypertension: -Group verbal and written instruction that provides a basic overview of hypertension including the most recent diagnostic guidelines, risk factor reduction with self-care instructions and medication management.    Nutrition I class: Heart Healthy Eating:  -Group  instruction provided by PowerPoint slides, verbal discussion, and written materials to support subject matter. The instructor gives an explanation and review of the Therapeutic Lifestyle Changes diet recommendations, which includes a discussion on lipid goals, dietary fat, sodium, fiber, plant stanol/sterol esters, sugar, and the components of a well-balanced, healthy diet.   Nutrition II class: Lifestyle Skills:  -Group instruction provided by PowerPoint slides, verbal discussion, and written materials to support subject matter. The instructor gives an explanation and review of label reading, grocery shopping for heart health, heart healthy recipe modifications, and ways to make healthier choices when eating out.   Diabetes Question & Answer:  -Group instruction provided by PowerPoint slides, verbal discussion, and written materials to support subject matter. The instructor gives an explanation and review of diabetes co-morbidities, pre- and post-prandial blood glucose goals, pre-exercise blood glucose goals, signs, symptoms, and treatment of hypoglycemia and hyperglycemia, and foot care basics.   Diabetes Blitz:  -Group instruction provided by PowerPoint slides, verbal discussion, and written materials to support subject matter. The instructor gives an explanation and review of the  physiology behind type 1 and type 2 diabetes, diabetes medications and rational behind using different medications, pre- and post-prandial blood glucose recommendations and Hemoglobin A1c goals, diabetes diet, and exercise including blood glucose guidelines for exercising safely.    Portion Distortion:  -Group instruction provided by PowerPoint slides, verbal discussion, written materials, and food models to support subject matter. The instructor gives an explanation of serving size versus portion size, changes in portions sizes over the last 20 years, and what consists of a serving from each food group.   Stress Management:  -Group instruction provided by verbal instruction, video, and written materials to support subject matter.  Instructors review role of stress in heart disease and how to cope with stress positively.     Exercising on Your Own:  -Group instruction provided by verbal instruction, power point, and written materials to support subject.  Instructors discuss benefits of exercise, components of exercise, frequency and intensity of exercise, and end points for exercise.  Also discuss use of nitroglycerin and activating EMS.  Review options of places to exercise outside of rehab.  Review guidelines for sex with heart disease.   Cardiac Drugs I:  -Group instruction provided by verbal instruction and written materials to support subject.  Instructor reviews cardiac drug classes: antiplatelets, anticoagulants, beta blockers, and statins.  Instructor discusses reasons, side effects, and lifestyle considerations for each drug class.   Cardiac Drugs II:  -Group instruction provided by verbal instruction and written materials to support subject.  Instructor reviews cardiac drug classes: angiotensin converting enzyme inhibitors (ACE-I), angiotensin II receptor blockers (ARBs), nitrates, and calcium channel blockers.  Instructor discusses reasons, side effects, and lifestyle considerations  for each drug class.   Anatomy and Physiology of the Circulatory System:  Group verbal and written instruction and models provide basic cardiac anatomy and physiology, with the coronary electrical and arterial systems. Review of: AMI, Angina, Valve disease, Heart Failure, Peripheral Artery Disease, Cardiac Arrhythmia, Pacemakers, and the ICD.   Other Education:  -Group or individual verbal, written, or video instructions that support the educational goals of the cardiac rehab program.   Holiday Eating Survival Tips:  -Group instruction provided by PowerPoint slides, verbal discussion, and written materials to support subject matter. The instructor gives patients tips, tricks, and techniques to help them not only survive but enjoy the holidays despite the onslaught of food that accompanies the holidays.   Knowledge Questionnaire Score:  Knowledge Questionnaire Score - 12/16/17 1138      Knowledge Questionnaire Score   Pre Score  21/24       Core Components/Risk Factors/Patient Goals at Admission: Personal Goals and Risk Factors at Admission - 12/16/17 1131      Core Components/Risk Factors/Patient Goals on Admission   Diabetes  Yes    Intervention  Provide education about signs/symptoms and action to take for hypo/hyperglycemia.;Provide education about proper nutrition, including hydration, and aerobic/resistive exercise prescription along with prescribed medications to achieve blood glucose in normal ranges: Fasting glucose 65-99 mg/dL    Expected Outcomes  Short Term: Participant verbalizes understanding of the signs/symptoms and immediate care of hyper/hypoglycemia, proper foot care and importance of medication, aerobic/resistive exercise and nutrition plan for blood glucose control.;Long Term: Attainment of HbA1C < 7%.    Hypertension  Yes    Intervention  Provide education on lifestyle modifcations including regular physical activity/exercise, weight management, moderate sodium  restriction and increased consumption of fresh fruit, vegetables, and low fat dairy, alcohol moderation, and smoking cessation.;Monitor prescription use compliance.    Expected Outcomes  Short Term: Continued assessment and intervention until BP is < 140/57mm HG in hypertensive participants. < 130/2mm HG in hypertensive participants with diabetes, heart failure or chronic kidney disease.;Long Term: Maintenance of blood pressure at goal levels.    Lipids  Yes    Intervention  Provide education and support for participant on nutrition & aerobic/resistive exercise along with prescribed medications to achieve LDL 70mg , HDL >40mg .    Expected Outcomes  Short Term: Participant states understanding of desired cholesterol values and is compliant with medications prescribed. Participant is following exercise prescription and nutrition guidelines.;Long Term: Cholesterol controlled with medications as prescribed, with individualized exercise RX and with personalized nutrition plan. Value goals: LDL < 70mg , HDL > 40 mg.       Core Components/Risk Factors/Patient Goals Review:    Core Components/Risk Factors/Patient Goals at Discharge (Final Review):    ITP Comments: ITP Comments    Row Name 12/16/17 0935           ITP Comments  Dr. Fransico Him, Medical Director          Comments:  Patient attended orientation from 0900 to 1058 to review rules and guidelines for program. Completed 6 minute walk test, Intitial ITP, and exercise prescription.  VSS. Telemetry-SR/ST. Pt asymptomatic with no complaints.  Pt is so very excited to get started with the cardiac rehab program to learn more about diabetes management and heart healthy lifestyle. Pt has no immediate barriers to participating in cardiac.   Pt feels supported by her family. Cherre Huger, BSN Cardiac and Training and development officer

## 2017-12-20 ENCOUNTER — Ambulatory Visit (HOSPITAL_COMMUNITY): Payer: BLUE CROSS/BLUE SHIELD

## 2017-12-22 ENCOUNTER — Ambulatory Visit (HOSPITAL_COMMUNITY): Payer: BLUE CROSS/BLUE SHIELD

## 2017-12-22 ENCOUNTER — Encounter (HOSPITAL_COMMUNITY)
Admission: RE | Admit: 2017-12-22 | Discharge: 2017-12-22 | Disposition: A | Discharge status: Home / Self Care | Payer: BLUE CROSS/BLUE SHIELD | Source: Ambulatory Visit | Attending: Cardiology | Admitting: Cardiology

## 2017-12-22 ENCOUNTER — Encounter (HOSPITAL_COMMUNITY): Payer: Self-pay

## 2017-12-22 DIAGNOSIS — I214 Non-ST elevation (NSTEMI) myocardial infarction: Secondary | ICD-10-CM

## 2017-12-22 DIAGNOSIS — I509 Heart failure, unspecified: Secondary | ICD-10-CM | POA: Insufficient documentation

## 2017-12-22 DIAGNOSIS — D509 Iron deficiency anemia, unspecified: Secondary | ICD-10-CM | POA: Insufficient documentation

## 2017-12-22 DIAGNOSIS — Z87891 Personal history of nicotine dependence: Secondary | ICD-10-CM | POA: Diagnosis not present

## 2017-12-22 DIAGNOSIS — Z7982 Long term (current) use of aspirin: Secondary | ICD-10-CM | POA: Diagnosis not present

## 2017-12-22 DIAGNOSIS — Z7984 Long term (current) use of oral hypoglycemic drugs: Secondary | ICD-10-CM | POA: Diagnosis not present

## 2017-12-22 DIAGNOSIS — E119 Type 2 diabetes mellitus without complications: Secondary | ICD-10-CM | POA: Diagnosis not present

## 2017-12-22 DIAGNOSIS — Z7902 Long term (current) use of antithrombotics/antiplatelets: Secondary | ICD-10-CM | POA: Diagnosis not present

## 2017-12-22 DIAGNOSIS — Z86718 Personal history of other venous thrombosis and embolism: Secondary | ICD-10-CM | POA: Diagnosis not present

## 2017-12-22 DIAGNOSIS — I110 Hypertensive heart disease with heart failure: Secondary | ICD-10-CM | POA: Diagnosis not present

## 2017-12-22 DIAGNOSIS — E669 Obesity, unspecified: Secondary | ICD-10-CM | POA: Diagnosis not present

## 2017-12-22 DIAGNOSIS — Z6841 Body Mass Index (BMI) 40.0 and over, adult: Secondary | ICD-10-CM | POA: Insufficient documentation

## 2017-12-22 DIAGNOSIS — Z79899 Other long term (current) drug therapy: Secondary | ICD-10-CM | POA: Diagnosis not present

## 2017-12-22 DIAGNOSIS — Z955 Presence of coronary angioplasty implant and graft: Secondary | ICD-10-CM | POA: Diagnosis not present

## 2017-12-22 DIAGNOSIS — I252 Old myocardial infarction: Secondary | ICD-10-CM | POA: Diagnosis not present

## 2017-12-22 LAB — GLUCOSE, CAPILLARY
GLUCOSE-CAPILLARY: 178 mg/dL — AB (ref 65–99)
Glucose-Capillary: 165 mg/dL — ABNORMAL HIGH (ref 65–99)

## 2017-12-22 NOTE — Progress Notes (Signed)
Daily Session Note  Patient Details  Name: [name] MRN: [id number] Date of Birth: 01-30-1966 Referring Provider:     CARDIAC REHAB PHASE II ORIENTATION from 12/16/2017 in [facility]  Referring Provider  [name] MD      Encounter Date: 12/22/2017  Check In: Session Check In - 12/22/17 1617      Check-In   Location  [facility]    Staff Present  Luetta Nutting Fair, MS, ACSM RCEP, Exercise Physiologist;Joann Rion, RN, Marga Melnick, RN, Deland Pretty, MS, ACSM CEP, Exercise Physiologist;Other    Supervising physician immediately available to respond to emergencies  [facility] immediately available    Physician(s)  Dr. Horris Latino     Medication changes reported      No    Fall or balance concerns reported     No    Tobacco Cessation  No Change    Warm-up and Cool-down  Performed as group-led instruction    Resistance Training Performed  No    VAD Patient?  No      Pain Assessment   Currently in Pain?  No/denies       Capillary Blood Glucose: Results for orders placed or performed during the hospital encounter of 12/22/17 (from the past 24 hour(s))  Glucose, capillary     Status: Abnormal   Collection Time: 12/22/17  3:06 PM  Result Value Ref Range   Glucose-Capillary 165 (H) 65 - 99 mg/dL  Glucose, capillary     Status: Abnormal   Collection Time: 12/22/17  3:56 PM  Result Value Ref Range   Glucose-Capillary 178 (H) 65 - 99 mg/dL      Social History   Tobacco Use  Smoking Status Former Smoker  . Packs/day: 0.50  . Years: 5.00  . Pack years: 2.50  . Types: Cigarettes  . Last attempt to quit: 10/19/1993  . Years since quitting: 24.1  Smokeless Tobacco Never Used    Goals Met:  Exercise tolerated well  Goals Unmet:  Not Applicable  Comments: [name] started cardiac rehab today.  Pt tolerated light exercise without difficulty. VSS, telemetry-Sinus Rhtyhm, asymptomatic.  Medication list  reconciled. Pt denies barriers to medicaiton compliance.  PSYCHOSOCIAL ASSESSMENT:  PHQ-0. Pt exhibits positive coping skills, hopeful outlook with supportive family. No psychosocial needs identified at this time, no psychosocial interventions necessary.    Pt enjoys cooking, reading, walking shopping and sewing.   Pt oriented to exercise equipment and routine.    Understanding verbalized.Barnet Pall, RN,BSN 12/22/2017 5:01 PM   Dr. Fransico Him is Medical Director for Cardiac Rehab at [facility].

## 2017-12-24 ENCOUNTER — Ambulatory Visit (HOSPITAL_COMMUNITY): Payer: BLUE CROSS/BLUE SHIELD

## 2017-12-24 ENCOUNTER — Encounter (HOSPITAL_COMMUNITY): Payer: BLUE CROSS/BLUE SHIELD

## 2017-12-27 ENCOUNTER — Encounter (HOSPITAL_COMMUNITY): Payer: BLUE CROSS/BLUE SHIELD

## 2017-12-27 ENCOUNTER — Ambulatory Visit (HOSPITAL_COMMUNITY): Payer: BLUE CROSS/BLUE SHIELD

## 2017-12-29 ENCOUNTER — Ambulatory Visit (HOSPITAL_COMMUNITY): Payer: BLUE CROSS/BLUE SHIELD

## 2017-12-29 ENCOUNTER — Encounter (HOSPITAL_COMMUNITY)
Admission: RE | Admit: 2017-12-29 | Discharge: 2017-12-29 | Disposition: A | Discharge status: Home / Self Care | Payer: BLUE CROSS/BLUE SHIELD | Source: Ambulatory Visit | Attending: Cardiology | Admitting: Cardiology

## 2017-12-29 DIAGNOSIS — Z955 Presence of coronary angioplasty implant and graft: Secondary | ICD-10-CM

## 2017-12-29 DIAGNOSIS — I214 Non-ST elevation (NSTEMI) myocardial infarction: Secondary | ICD-10-CM

## 2017-12-29 DIAGNOSIS — I252 Old myocardial infarction: Secondary | ICD-10-CM | POA: Diagnosis not present

## 2017-12-29 LAB — GLUCOSE, CAPILLARY
GLUCOSE-CAPILLARY: 107 mg/dL — AB (ref 65–99)
Glucose-Capillary: 150 mg/dL — ABNORMAL HIGH (ref 65–99)

## 2017-12-31 ENCOUNTER — Ambulatory Visit (HOSPITAL_COMMUNITY): Payer: BLUE CROSS/BLUE SHIELD

## 2017-12-31 ENCOUNTER — Encounter (HOSPITAL_COMMUNITY)
Admission: RE | Admit: 2017-12-31 | Discharge: 2017-12-31 | Disposition: A | Discharge status: Home / Self Care | Payer: BLUE CROSS/BLUE SHIELD | Source: Ambulatory Visit

## 2017-12-31 ENCOUNTER — Encounter (HOSPITAL_COMMUNITY)
Admission: RE | Admit: 2017-12-31 | Discharge: 2017-12-31 | Disposition: A | Discharge status: Home / Self Care | Payer: BLUE CROSS/BLUE SHIELD | Source: Ambulatory Visit | Attending: Cardiology | Admitting: Cardiology

## 2017-12-31 DIAGNOSIS — I214 Non-ST elevation (NSTEMI) myocardial infarction: Secondary | ICD-10-CM

## 2017-12-31 DIAGNOSIS — Z955 Presence of coronary angioplasty implant and graft: Secondary | ICD-10-CM

## 2017-12-31 DIAGNOSIS — I252 Old myocardial infarction: Secondary | ICD-10-CM | POA: Diagnosis not present

## 2017-12-31 LAB — GLUCOSE, CAPILLARY: GLUCOSE-CAPILLARY: 174 mg/dL — AB (ref 65–99)

## 2017-12-31 NOTE — Progress Notes (Signed)
OUTPATIENT CARDIAC REHAB  PMH: NSTEMI, DES   Primary Cardiologist:  Dr. Virgina Jock  Pt arrived at cardiac rehab, HR-136, sinus tachycardia. Pt asymptomatic. Pt denies caffeine, fatigue, pain.  However pt does report coreg 25mg  makes her feel dizzy and sluggish if she takes while working.  Therefore she takes am dose around 3pm and pm dose around 9:30pm.  Dr. Virgina Jock nurse made aware and she will contact pt to advise of Dr. Virgina Jock recommendation.  Pt verbalized understanding.  Andi Hence, RN, BSN Cardiac Pulmonary Rehab 12/31/17 2:45 PM

## 2018-01-03 ENCOUNTER — Encounter (HOSPITAL_COMMUNITY): Payer: BLUE CROSS/BLUE SHIELD

## 2018-01-03 ENCOUNTER — Ambulatory Visit (HOSPITAL_COMMUNITY): Payer: BLUE CROSS/BLUE SHIELD

## 2018-01-05 ENCOUNTER — Encounter (HOSPITAL_COMMUNITY)
Admission: RE | Admit: 2018-01-05 | Discharge: 2018-01-05 | Disposition: A | Discharge status: Home / Self Care | Payer: BLUE CROSS/BLUE SHIELD | Source: Ambulatory Visit | Attending: Cardiology | Admitting: Cardiology

## 2018-01-05 ENCOUNTER — Ambulatory Visit (HOSPITAL_COMMUNITY): Payer: BLUE CROSS/BLUE SHIELD

## 2018-01-05 DIAGNOSIS — I252 Old myocardial infarction: Secondary | ICD-10-CM | POA: Diagnosis not present

## 2018-01-05 DIAGNOSIS — Z955 Presence of coronary angioplasty implant and graft: Secondary | ICD-10-CM

## 2018-01-05 DIAGNOSIS — I214 Non-ST elevation (NSTEMI) myocardial infarction: Secondary | ICD-10-CM

## 2018-01-05 LAB — GLUCOSE, CAPILLARY: Glucose-Capillary: 178 mg/dL — ABNORMAL HIGH (ref 65–99)

## 2018-01-06 NOTE — Progress Notes (Signed)
Cardiac Individual Treatment Plan  Patient Details  Name: [name] MRN: [id number] Date of Birth: 21-Jan-1966 Referring Provider:     [place] from 12/16/2017 in [place]  Referring Provider  [name] MD      Initial Encounter Date:    CARDIAC REHAB PHASE II ORIENTATION from 12/16/2017 in [place]  Date  12/16/17  Referring Provider  [name] MD      Visit Diagnosis: NSTEMI (non-ST elevated myocardial infarction) [facility])  Status post coronary artery stent placement  Patient's Home Medications on Admission:  Current Outpatient Medications:  .  amLODipine (NORVASC) 5 MG tablet, Take 5 mg by mouth every evening. , Disp: , Rfl:  .  aspirin 81 MG tablet, Take 81 mg by mouth every morning. , Disp: , Rfl:  .  atorvastatin (LIPITOR) 40 MG tablet, Take 40 mg by mouth daily., Disp: , Rfl: 1 .  carvedilol (COREG) 25 MG tablet, Take 1 tablet (25 mg total) by mouth 2 (two) times daily., Disp: 60 tablet, Rfl: 11 .  Cholecalciferol (VITAMIN D-3) 1000 UNITS CAPS, Take 1 capsule by mouth daily. , Disp: , Rfl:  .  Ferrous Sulfate (IRON) 325 (65 FE) MG TABS, Take 1 tablet by mouth 2 (two) times daily.  , Disp: , Rfl:  .  furosemide (LASIX) 40 MG tablet, Take 40 mg by mouth daily. , Disp: , Rfl:  .  losartan (COZAAR) 50 MG tablet, Take 1 tablet (50 mg total) by mouth daily., Disp: 30 tablet, Rfl: 11 .  metFORMIN (GLUCOPHAGE) 500 MG tablet, Take 1,000 mg by mouth 2 (two) times daily with a meal. , Disp: , Rfl:  .  nitroGLYCERIN (NITROSTAT) 0.4 MG SL tablet, Place 1 tablet (0.4 mg total) under the tongue every 5 (five) minutes x 3 doses as needed for chest pain., Disp: 30 tablet, Rfl: 1 .  ondansetron (ZOFRAN ODT) 4 MG disintegrating tablet, Take 1 tablet (4 mg total) by mouth every 8 (eight) hours as needed for nausea or vomiting., Disp: 10 tablet, Rfl: 0 .  ticagrelor (BRILINTA) 90  MG TABS tablet, Take 1 tablet (90 mg total) by mouth 2 (two) times daily., Disp: 60 tablet, Rfl: 3  Past Medical History: Past Medical History:  Diagnosis Date  . Anemia   . CHF (congestive heart failure) ([place]) 04/2011   EF 15-20% from echo 05/19/11  . DOE (dyspnea on exertion)   . DVT (deep venous thrombosis) ([place]) 06/2011   LLE  . Fatigue   . HTN (hypertension)   . Menorrhagia    with iron deficient anemia  . NSTEMI (non-ST elevated myocardial infarction) ([place]) 10/26/2017  . NSTEMI (non-ST elevated myocardial infarction) ([place]) 10/27/2017  . Obesity   . Orthopnea   . Type II diabetes mellitus ([facility])     Tobacco Use: Social History   Tobacco Use  Smoking Status Former Smoker  . Packs/day: 0.50  . Years: 5.00  . Pack years: 2.50  . Types: Cigarettes  . Last attempt to quit: 10/19/1993  . Years since quitting: 24.2  Smokeless Tobacco Never Used    Labs: Recent Chemical engineer    Labs for ITP Cardiac and Pulmonary Rehab Latest Ref Rng & Units 09/26/2011 04/15/2012 05/20/2013 10/27/2017 10/28/2017   Cholestrol 0 - 200 mg/dL - - - - 118   LDLCALC 0 - 99 mg/dL - - - - 65   HDL >40 mg/dL - - - -  31(L)   Trlycerides <150 mg/dL - - - - 111   Hemoglobin A1c 4.8 - 5.6 % - - - 10.2(H) -   TCO2 0 - 100 mmol/L 26 24 29  - -      Capillary Blood Glucose: Lab Results  Component Value Date   GLUCAP 178 (H) 01/05/2018   GLUCAP 174 (H) 12/31/2017   GLUCAP 107 (H) 12/29/2017   GLUCAP 150 (H) 12/29/2017   GLUCAP 178 (H) 12/22/2017     Exercise Target Goals:    Exercise Program Goal: Individual exercise prescription set using results from initial 6 min walk test and THRR while considering  patient's activity barriers and safety.   Exercise Prescription Goal: Initial exercise prescription builds to 30-45 minutes a day of aerobic activity, 2-3 days per week.  Home exercise guidelines will be given to patient during program as part of exercise prescription that the participant will  acknowledge.  Activity Barriers & Risk Stratification: Activity Barriers & Cardiac Risk Stratification - 12/16/17 0946      Activity Barriers & Cardiac Risk Stratification   Activity Barriers  Balance Concerns;Muscular Weakness;Deconditioning;Other (comment)    Comments  occ. L knee pain/discomfort    Cardiac Risk Stratification  High       6 Minute Walk: 6 Minute Walk    Row Name 12/16/17 1134         6 Minute Walk   Phase  Initial     Distance  1539 feet     Walk Time  6 minutes     # of Rest Breaks  0     MPH  2.91     METS  3.7     RPE  7     Perceived Dyspnea   0     VO2 Peak  13.1     Symptoms  No     Resting HR  86 bpm     Resting BP  104/62     Resting Oxygen Saturation   99 %     Exercise Oxygen Saturation  during 6 min walk  99 %     Max Ex. HR  136 bpm     Max Ex. BP  134/70     2 Minute Post BP  118/80        Oxygen Initial Assessment:   Oxygen Re-Evaluation:   Oxygen Discharge (Final Oxygen Re-Evaluation):   Initial Exercise Prescription: Initial Exercise Prescription - 12/16/17 1100      Date of Initial Exercise RX and Referring Provider   Date  12/16/17    Referring Provider  [name] MD      Treadmill   MPH  2    Grade  1    Minutes  10    METs  2.81      NuStep   Level  3    SPM  70    Minutes  10    METs  2.3      Arm Ergometer   Level  2    Watts  30    Minutes  10    METs  2.5      Prescription Details   Frequency (times per week)  3x    Duration  Progress to 30 minutes of continuous aerobic without signs/symptoms of physical distress      Intensity   THRR 40-80% of Max Heartrate  68-137    Ratings of Perceived Exertion  11-13    Perceived Dyspnea  0-4  Progression   Progression  Continue progressive overload as per policy without signs/symptoms or physical distress.      Resistance Training   Training Prescription  Yes    Weight  3lb    Reps  10-15       Perform Capillary Blood Glucose  checks as needed.  Exercise Prescription Changes: Exercise Prescription Changes    Row Name 12/29/17 1520             Response to Exercise   Blood Pressure (Admit)  114/74       Blood Pressure (Exercise)  138/70       Blood Pressure (Exit)  108/72       Heart Rate (Admit)  101 bpm       Heart Rate (Exercise)  135 bpm       Heart Rate (Exit)  96 bpm       Rating of Perceived Exertion (Exercise)  11       Symptoms  none       Duration  Progress to 30 minutes of  aerobic without signs/symptoms of physical distress       Intensity  THRR unchanged         Progression   Progression  Continue to progress workloads to maintain intensity without signs/symptoms of physical distress.       Average METs  2.4         Resistance Training   Training Prescription  No Relaxation, no weights today.         Interval Training   Interval Training  No         NuStep   Level  4       SPM  70       Minutes  10       METs  2.4         Arm Ergometer   Level  2       Watts  30       Minutes  10       METs  2.4          Exercise Comments: Exercise Comments    Row Name 01/04/18 1402           Exercise Comments  Patient out since 12/31/17 when her resting heart rate was too high to exercise. Pt has seen cardiologist and plans to resume exercise this week.          Exercise Goals and Review: Exercise Goals    Row Name 12/16/17 0946             Exercise Goals   Increase Physical Activity  Yes       Intervention  Provide advice, education, support and counseling about physical activity/exercise needs.;Develop an individualized exercise prescription for aerobic and resistive training based on initial evaluation findings, risk stratification, comorbidities and participant's personal goals.       Expected Outcomes  Short Term: Attend rehab on a regular basis to increase amount of physical activity.;Long Term: Exercising regularly at least 3-5 days a week.;Long Term: Add in home  exercise to make exercise part of routine and to increase amount of physical activity.       Increase Strength and Stamina  Yes       Intervention  Provide advice, education, support and counseling about physical activity/exercise needs.;Develop an individualized exercise prescription for aerobic and resistive training based on initial evaluation findings, risk stratification, comorbidities and participant's personal goals.  Expected Outcomes  Short Term: Increase workloads from initial exercise prescription for resistance, speed, and METs.;Short Term: Perform resistance training exercises routinely during rehab and add in resistance training at home;Long Term: Improve cardiorespiratory fitness, muscular endurance and strength as measured by increased METs and functional capacity (6MWT)       Able to understand and use rate of perceived exertion (RPE) scale  Yes       Intervention  Provide education and explanation on how to use RPE scale       Expected Outcomes  Short Term: Able to use RPE daily in rehab to express subjective intensity level;Long Term:  Able to use RPE to guide intensity level when exercising independently       Knowledge and understanding of Target Heart Rate Range (THRR)  Yes       Intervention  Provide education and explanation of THRR including how the numbers were predicted and where they are located for reference       Expected Outcomes  Short Term: Able to state/look up THRR;Long Term: Able to use THRR to govern intensity when exercising independently;Short Term: Able to use daily as guideline for intensity in rehab       Able to check pulse independently  Yes       Intervention  Provide education and demonstration on how to check pulse in carotid and radial arteries.;Review the importance of being able to check your own pulse for safety during independent exercise       Expected Outcomes  Short Term: Able to explain why pulse checking is important during independent  exercise;Long Term: Able to check pulse independently and accurately       Understanding of Exercise Prescription  Yes       Intervention  Provide education, explanation, and written materials on patient's individual exercise prescription       Expected Outcomes  Short Term: Able to explain program exercise prescription;Long Term: Able to explain home exercise prescription to exercise independently          Exercise Goals Re-Evaluation : Exercise Goals Re-Evaluation    Row Name 01/04/18 1401             Exercise Goal Re-Evaluation   Comments  Patient's heart rate of 12/31/17 was elevated in the 130s at rest. No exercise, cardiologist contacted.           Discharge Exercise Prescription (Final Exercise Prescription Changes): Exercise Prescription Changes - 12/29/17 1520      Response to Exercise   Blood Pressure (Admit)  114/74    Blood Pressure (Exercise)  138/70    Blood Pressure (Exit)  108/72    Heart Rate (Admit)  101 bpm    Heart Rate (Exercise)  135 bpm    Heart Rate (Exit)  96 bpm    Rating of Perceived Exertion (Exercise)  11    Symptoms  none    Duration  Progress to 30 minutes of  aerobic without signs/symptoms of physical distress    Intensity  THRR unchanged      Progression   Progression  Continue to progress workloads to maintain intensity without signs/symptoms of physical distress.    Average METs  2.4      Resistance Training   Training Prescription  No Relaxation, no weights today.      Interval Training   Interval Training  No      NuStep   Level  4    SPM  70  Minutes  10    METs  2.4      Arm Ergometer   Level  2    Watts  30    Minutes  10    METs  2.4       Nutrition:  Target Goals: Understanding of nutrition guidelines, daily intake of sodium 1500mg , cholesterol 200mg , calories 30% from fat and 7% or less from saturated fats, daily to have 5 or more servings of fruits and vegetables.  Biometrics: Pre Biometrics - 12/16/17 1140       Pre Biometrics   Height  5\' 2"  (1.575 m)        Nutrition Therapy Plan and Nutrition Goals: Nutrition Therapy & Goals - 12/16/17 1219      Nutrition Therapy   Diet  Carb Modified, Heart Healthy      Personal Nutrition Goals   Nutrition Goal  Pt to identify food quantities necessary to achieve weight loss of 6-24 lb (2.7-10.9 kg) at graduation from cardiac rehab.    Personal Goal #2  Improved blood glucose control as evidenced by pt's A1c trending from 10.2 toward less than 7.0.      Intervention Plan   Intervention  Prescribe, educate and counsel regarding individualized specific dietary modifications aiming towards targeted core components such as weight, hypertension, lipid management, diabetes, heart failure and other comorbidities.    Expected Outcomes  Short Term Goal: Understand basic principles of dietary content, such as calories, fat, sodium, cholesterol and nutrients.;Long Term Goal: Adherence to prescribed nutrition plan.       Nutrition Assessments: Nutrition Assessments - 12/16/17 1219      MEDFICTS Scores   Pre Score  9       Nutrition Goals Re-Evaluation:   Nutrition Goals Re-Evaluation:   Nutrition Goals Discharge (Final Nutrition Goals Re-Evaluation):   Psychosocial: Target Goals: Acknowledge presence or absence of significant depression and/or stress, maximize coping skills, provide positive support system. Participant is able to verbalize types and ability to use techniques and skills needed for reducing stress and depression.  Initial Review & Psychosocial Screening: Initial Psych Review & Screening - 12/16/17 1236      Initial Review   Current issues with  None Identified      Family Dynamics   Good Support System?  Yes    Comments  Pt is very excited to learn more on how to live a heart healty lifestyle.      Barriers   Psychosocial barriers to participate in program  The patient should benefit from training in stress management and  relaxation.;There are no identifiable barriers or psychosocial needs.      Screening Interventions   Interventions  Encouraged to exercise    Expected Outcomes  Short Term goal: Identification and review with participant of any Quality of Life or Depression concerns found by scoring the questionnaire.;Long Term goal: The participant improves quality of Life and PHQ9 Scores as seen by post scores and/or verbalization of changes       Quality of Life Scores: Quality of Life - 12/16/17 1139      Quality of Life Scores   Health/Function Pre  28.96 %    Socioeconomic Pre  28.29 %    Psych/Spiritual Pre  30 %    Family Pre  28.5 %    GLOBAL Pre  28.98 %      Scores of 19 and below usually indicate a poorer quality of life in these areas.  A difference of  2-3  points is a clinically meaningful difference.  A difference of 2-3 points in the total score of the Quality of Life Index has been associated with significant improvement in overall quality of life, self-image, physical symptoms, and general health in studies assessing change in quality of life.  PHQ-9: Recent Review Flowsheet Data    Depression screen [name] 2/9 12/22/2017   Decreased Interest 0   Down, Depressed, Hopeless 0   PHQ - 2 Score 0     Interpretation of Total Score  Total Score Depression Severity:  1-4 = Minimal depression, 5-9 = Mild depression, 10-14 = Moderate depression, 15-19 = Moderately severe depression, 20-27 = Severe depression   Psychosocial Evaluation and Intervention:   Psychosocial Re-Evaluation: Psychosocial Re-Evaluation    [place] Name 01/06/18 1508             Psychosocial Re-Evaluation   Current issues with  None Identified       Interventions  Encouraged to attend Cardiac Rehabilitation for the exercise;Stress management education       Continue Psychosocial Services   No Follow up required          Psychosocial Discharge (Final Psychosocial Re-Evaluation): Psychosocial Re-Evaluation -  01/06/18 1508      Psychosocial Re-Evaluation   Current issues with  None Identified    Interventions  Encouraged to attend Cardiac Rehabilitation for the exercise;Stress management education    Continue Psychosocial Services   No Follow up required       Vocational Rehabilitation: Provide vocational rehab assistance to qualifying candidates.   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       Education: Education Goals: Education classes will be provided on a weekly basis, covering required topics. Participant will state understanding/return demonstration of topics presented.  Learning Barriers/Preferences: Learning Barriers/Preferences - 12/16/17 0946      Learning Barriers/Preferences   Learning Barriers  Sight    Learning Preferences  Skilled Demonstration;Verbal Instruction;Video;Written Material       Education Topics: Count Your Pulse:  -Group instruction provided by verbal instruction, demonstration, patient participation and written materials to support subject.  Instructors address importance of being able to find your pulse and how to count your pulse when at home without a heart monitor.  Patients get hands on experience counting their pulse with staff help and individually.   Heart Attack, Angina, and Risk Factor Modification:  -Group instruction provided by verbal instruction, video, and written materials to support subject.  Instructors address signs and symptoms of angina and heart attacks.    Also discuss risk factors for heart disease and how to make changes to improve heart health risk factors.   Functional Fitness:  -Group instruction provided by verbal instruction, demonstration, patient participation, and written materials to support subject.   Instructors address safety measures for doing things around the house.  Discuss how to get up and down off the floor, how to pick things up properly, how to safely get out of a chair without assistance, and balance training.   Meditation and Mindfulness:  -Group instruction provided by verbal instruction, patient participation, and written materials to support subject.  Instructor addresses importance of mindfulness and meditation practice to help reduce stress and improve awareness.  Instructor also leads participants through a meditation exercise.    Stretching  for Flexibility and Mobility:  -Group instruction provided by verbal instruction, patient participation, and written materials to support subject.  Instructors lead participants through series of stretches that are designed to increase flexibility thus improving mobility.  These stretches are additional exercise for major muscle groups that are typically performed during regular warm up and cool down.   Hands Only CPR:  -Group verbal, video, and participation provides a basic overview of AHA guidelines for community CPR. Role-play of emergencies allow participants the opportunity to practice calling for help and chest compression technique with discussion of AED use.   Hypertension: -Group verbal and written instruction that provides a basic overview of hypertension including the most recent diagnostic guidelines, risk factor reduction with self-care instructions and medication management.    Nutrition I class: Heart Healthy Eating:  -Group instruction provided by PowerPoint slides, verbal discussion, and written materials to support subject matter. The instructor gives an explanation and review of the Therapeutic Lifestyle Changes diet recommendations, which includes a discussion on lipid goals, dietary fat, sodium, fiber, plant stanol/sterol esters, sugar, and the components of a well-balanced, healthy diet.   Nutrition II class:  Lifestyle Skills:  -Group instruction provided by PowerPoint slides, verbal discussion, and written materials to support subject matter. The instructor gives an explanation and review of label reading, grocery shopping for heart health, heart healthy recipe modifications, and ways to make healthier choices when eating out.   Diabetes Question & Answer:  -Group instruction provided by PowerPoint slides, verbal discussion, and written materials to support subject matter. The instructor gives an explanation and review of diabetes co-morbidities, pre- and post-prandial blood glucose goals, pre-exercise blood glucose goals, signs, symptoms, and treatment of hypoglycemia and hyperglycemia, and foot care basics.   Diabetes Blitz:  -Group instruction provided by PowerPoint slides, verbal discussion, and written materials to support subject matter. The instructor gives an explanation and review of the physiology behind type 1 and type 2 diabetes, diabetes medications and rational behind using different medications, pre- and post-prandial blood glucose recommendations and Hemoglobin A1c goals, diabetes diet, and exercise including blood glucose guidelines for exercising safely.    Portion Distortion:  -Group instruction provided by PowerPoint slides, verbal discussion, written materials, and food models to support subject matter. The instructor gives an explanation of serving size versus portion size, changes in portions sizes over the last 20 years, and what consists of a serving from each food group.   Stress Management:  -Group instruction provided by verbal instruction, video, and written materials to support subject matter.  Instructors review role of stress in heart disease and how to cope with stress positively.     Exercising on Your Own:  -Group instruction provided by verbal instruction, power point, and written materials to support subject.  Instructors discuss benefits of exercise, components  of exercise, frequency and intensity of exercise, and end points for exercise.  Also discuss use of nitroglycerin and activating EMS.  Review options of places to exercise outside of rehab.  Review guidelines for sex with heart disease.   CARDIAC REHAB PHASE II EXERCISE from 12/22/2017 in [place]  Date  12/22/17  Instruction Review Code  2- Demonstrated Understanding      Cardiac Drugs I:  -Group instruction provided by verbal instruction and written materials to support subject.  Instructor reviews cardiac drug classes: antiplatelets, anticoagulants, beta blockers, and statins.  Instructor discusses reasons, side effects, and lifestyle considerations for each drug class.   Cardiac Drugs II:  -  Group instruction provided by verbal instruction and written materials to support subject.  Instructor reviews cardiac drug classes: angiotensin converting enzyme inhibitors (ACE-I), angiotensin II receptor blockers (ARBs), nitrates, and calcium channel blockers.  Instructor discusses reasons, side effects, and lifestyle considerations for each drug class.   Anatomy and Physiology of the Circulatory System:  Group verbal and written instruction and models provide basic cardiac anatomy and physiology, with the coronary electrical and arterial systems. Review of: AMI, Angina, Valve disease, Heart Failure, Peripheral Artery Disease, Cardiac Arrhythmia, Pacemakers, and the ICD.   Other Education:  -Group or individual verbal, written, or video instructions that support the educational goals of the cardiac rehab program.   Holiday Eating Survival Tips:  -Group instruction provided by PowerPoint slides, verbal discussion, and written materials to support subject matter. The instructor gives patients tips, tricks, and techniques to help them not only survive but enjoy the holidays despite the onslaught of food that accompanies the holidays.   Knowledge Questionnaire  Score: Knowledge Questionnaire Score - 12/16/17 1138      Knowledge Questionnaire Score   Pre Score  21/24       Core Components/Risk Factors/Patient Goals at Admission: Personal Goals and Risk Factors at Admission - 12/16/17 1131      Core Components/Risk Factors/Patient Goals on Admission   Diabetes  Yes    Intervention  Provide education about signs/symptoms and action to take for hypo/hyperglycemia.;Provide education about proper nutrition, including hydration, and aerobic/resistive exercise prescription along with prescribed medications to achieve blood glucose in normal ranges: Fasting glucose 65-99 mg/dL    Expected Outcomes  Short Term: Participant verbalizes understanding of the signs/symptoms and immediate care of hyper/hypoglycemia, proper foot care and importance of medication, aerobic/resistive exercise and nutrition plan for blood glucose control.;Long Term: Attainment of HbA1C < 7%.    Hypertension  Yes    Intervention  Provide education on lifestyle modifcations including regular physical activity/exercise, weight management, moderate sodium restriction and increased consumption of fresh fruit, vegetables, and low fat dairy, alcohol moderation, and smoking cessation.;Monitor prescription use compliance.    Expected Outcomes  Short Term: Continued assessment and intervention until BP is < 140/54mm HG in hypertensive participants. < 130/89mm HG in hypertensive participants with diabetes, heart failure or chronic kidney disease.;Long Term: Maintenance of blood pressure at goal levels.    Lipids  Yes    Intervention  Provide education and support for participant on nutrition & aerobic/resistive exercise along with prescribed medications to achieve LDL 70mg , HDL >40mg .    Expected Outcomes  Short Term: Participant states understanding of desired cholesterol values and is compliant with medications prescribed. Participant is following exercise prescription and nutrition  guidelines.;Long Term: Cholesterol controlled with medications as prescribed, with individualized exercise RX and with personalized nutrition plan. Value goals: LDL < 70mg , HDL > 40 mg.       Core Components/Risk Factors/Patient Goals Review:  Goals and Risk Factor Review    Row Name 01/06/18 1503             Core Components/Risk Factors/Patient Goals Review   Personal Goals Review  Weight Management/Obesity;Diabetes;Hypertension;Lipids       Review  Alek is off to a good start to exercise. Kasiyah has been advised to come twice a week due to her work schedule.       Expected Outcomes  Ryleah will particpate in phase 2 cardiac rehab and take her medications as presribed.           Core Components/Risk Factors/Patient  Goals at Discharge (Final Review):  Goals and Risk Factor Review - 01/06/18 1503      Core Components/Risk Factors/Patient Goals Review   Personal Goals Review  Weight Management/Obesity;Diabetes;Hypertension;Lipids    Review  Caro is off to a good start to exercise. Advika has been advised to come twice a week due to her work schedule.    Expected Outcomes  Ladonya will particpate in phase 2 cardiac rehab and take her medications as presribed.        ITP Comments: ITP Comments    Row Name 12/16/17 0935 01/06/18 1502         ITP Comments  Dr. Fransico Him, Medical Director  30 Day ITP review. Tanazia is off to a good start to exercise         Comments: See ITP comments.Barnet Pall, RN,BSN 01/06/2018 3:10 PM

## 2018-01-07 ENCOUNTER — Ambulatory Visit (HOSPITAL_COMMUNITY): Payer: BLUE CROSS/BLUE SHIELD

## 2018-01-07 ENCOUNTER — Encounter (HOSPITAL_COMMUNITY)
Admission: RE | Admit: 2018-01-07 | Discharge: 2018-01-07 | Disposition: A | Discharge status: Home / Self Care | Payer: BLUE CROSS/BLUE SHIELD | Source: Ambulatory Visit | Attending: Cardiology | Admitting: Cardiology

## 2018-01-07 DIAGNOSIS — I214 Non-ST elevation (NSTEMI) myocardial infarction: Secondary | ICD-10-CM

## 2018-01-07 DIAGNOSIS — Z955 Presence of coronary angioplasty implant and graft: Secondary | ICD-10-CM

## 2018-01-07 DIAGNOSIS — I252 Old myocardial infarction: Secondary | ICD-10-CM | POA: Diagnosis not present

## 2018-01-10 ENCOUNTER — Ambulatory Visit (HOSPITAL_COMMUNITY): Payer: BLUE CROSS/BLUE SHIELD

## 2018-01-10 ENCOUNTER — Encounter (HOSPITAL_COMMUNITY): Payer: BLUE CROSS/BLUE SHIELD

## 2018-01-10 LAB — GLUCOSE, CAPILLARY: GLUCOSE-CAPILLARY: 162 mg/dL — AB (ref 65–99)

## 2018-01-12 ENCOUNTER — Ambulatory Visit (HOSPITAL_COMMUNITY): Payer: BLUE CROSS/BLUE SHIELD

## 2018-01-12 ENCOUNTER — Telehealth (HOSPITAL_COMMUNITY): Payer: Self-pay

## 2018-01-12 ENCOUNTER — Encounter (HOSPITAL_COMMUNITY): Payer: BLUE CROSS/BLUE SHIELD

## 2018-01-12 NOTE — Telephone Encounter (Signed)
Patient called and stated that she will not be able to participate on Wednesday's for Cardiac Rehab as she has to pick up her daughter from school. Took out Wednesday appts.

## 2018-01-14 ENCOUNTER — Ambulatory Visit (HOSPITAL_COMMUNITY): Payer: BLUE CROSS/BLUE SHIELD

## 2018-01-14 ENCOUNTER — Encounter (HOSPITAL_COMMUNITY)
Admission: RE | Admit: 2018-01-14 | Discharge: 2018-01-14 | Disposition: A | Discharge status: Home / Self Care | Payer: BLUE CROSS/BLUE SHIELD | Source: Ambulatory Visit

## 2018-01-14 DIAGNOSIS — Z955 Presence of coronary angioplasty implant and graft: Secondary | ICD-10-CM

## 2018-01-14 DIAGNOSIS — I214 Non-ST elevation (NSTEMI) myocardial infarction: Secondary | ICD-10-CM

## 2018-01-14 DIAGNOSIS — I252 Old myocardial infarction: Secondary | ICD-10-CM | POA: Diagnosis not present

## 2018-01-17 ENCOUNTER — Ambulatory Visit (HOSPITAL_COMMUNITY): Payer: BLUE CROSS/BLUE SHIELD

## 2018-01-17 ENCOUNTER — Encounter (HOSPITAL_COMMUNITY)
Admission: RE | Admit: 2018-01-17 | Discharge: 2018-01-17 | Disposition: A | Discharge status: Home / Self Care | Payer: BLUE CROSS/BLUE SHIELD | Source: Ambulatory Visit | Attending: Cardiology | Admitting: Cardiology

## 2018-01-17 DIAGNOSIS — Z79899 Other long term (current) drug therapy: Secondary | ICD-10-CM | POA: Insufficient documentation

## 2018-01-17 DIAGNOSIS — D509 Iron deficiency anemia, unspecified: Secondary | ICD-10-CM | POA: Diagnosis not present

## 2018-01-17 DIAGNOSIS — E119 Type 2 diabetes mellitus without complications: Secondary | ICD-10-CM | POA: Diagnosis not present

## 2018-01-17 DIAGNOSIS — Z7984 Long term (current) use of oral hypoglycemic drugs: Secondary | ICD-10-CM | POA: Insufficient documentation

## 2018-01-17 DIAGNOSIS — E669 Obesity, unspecified: Secondary | ICD-10-CM | POA: Diagnosis not present

## 2018-01-17 DIAGNOSIS — I110 Hypertensive heart disease with heart failure: Secondary | ICD-10-CM | POA: Diagnosis not present

## 2018-01-17 DIAGNOSIS — Z7902 Long term (current) use of antithrombotics/antiplatelets: Secondary | ICD-10-CM | POA: Insufficient documentation

## 2018-01-17 DIAGNOSIS — I252 Old myocardial infarction: Secondary | ICD-10-CM | POA: Insufficient documentation

## 2018-01-17 DIAGNOSIS — Z7982 Long term (current) use of aspirin: Secondary | ICD-10-CM | POA: Diagnosis not present

## 2018-01-17 DIAGNOSIS — I214 Non-ST elevation (NSTEMI) myocardial infarction: Secondary | ICD-10-CM

## 2018-01-17 DIAGNOSIS — Z955 Presence of coronary angioplasty implant and graft: Secondary | ICD-10-CM

## 2018-01-17 DIAGNOSIS — Z87891 Personal history of nicotine dependence: Secondary | ICD-10-CM | POA: Diagnosis not present

## 2018-01-17 DIAGNOSIS — I509 Heart failure, unspecified: Secondary | ICD-10-CM | POA: Diagnosis not present

## 2018-01-17 DIAGNOSIS — Z6841 Body Mass Index (BMI) 40.0 and over, adult: Secondary | ICD-10-CM | POA: Insufficient documentation

## 2018-01-17 DIAGNOSIS — Z86718 Personal history of other venous thrombosis and embolism: Secondary | ICD-10-CM | POA: Insufficient documentation

## 2018-01-17 LAB — GLUCOSE, CAPILLARY: GLUCOSE-CAPILLARY: 140 mg/dL — AB (ref 65–99)

## 2018-01-17 NOTE — Progress Notes (Signed)
Reviewed home exercise guidelines with patient including endpoints, temperature precautions, target heart rate and rate of perceived exertion. Pt is walking 30 minutes, 3 days/week  as her mode of home exercise. Pt voices understanding of instructions given. Sol Passer, MS, ACSM CEP

## 2018-01-19 ENCOUNTER — Encounter (HOSPITAL_COMMUNITY): Payer: BLUE CROSS/BLUE SHIELD

## 2018-01-19 ENCOUNTER — Ambulatory Visit (HOSPITAL_COMMUNITY): Payer: BLUE CROSS/BLUE SHIELD

## 2018-01-21 ENCOUNTER — Encounter (HOSPITAL_COMMUNITY)
Admission: RE | Admit: 2018-01-21 | Discharge: 2018-01-21 | Disposition: A | Discharge status: Home / Self Care | Payer: BLUE CROSS/BLUE SHIELD | Source: Ambulatory Visit | Attending: Cardiology | Admitting: Cardiology

## 2018-01-21 ENCOUNTER — Ambulatory Visit (HOSPITAL_COMMUNITY): Payer: BLUE CROSS/BLUE SHIELD

## 2018-01-21 VITALS — BP 120/80 | HR 81 | Wt 247.6 lb

## 2018-01-21 DIAGNOSIS — I214 Non-ST elevation (NSTEMI) myocardial infarction: Secondary | ICD-10-CM

## 2018-01-21 DIAGNOSIS — I252 Old myocardial infarction: Secondary | ICD-10-CM | POA: Diagnosis not present

## 2018-01-21 DIAGNOSIS — Z955 Presence of coronary angioplasty implant and graft: Secondary | ICD-10-CM

## 2018-01-21 LAB — GLUCOSE, CAPILLARY: GLUCOSE-CAPILLARY: 210 mg/dL — AB (ref 65–99)

## 2018-01-24 ENCOUNTER — Telehealth (HOSPITAL_COMMUNITY): Payer: Self-pay

## 2018-01-24 ENCOUNTER — Ambulatory Visit (HOSPITAL_COMMUNITY): Payer: BLUE CROSS/BLUE SHIELD

## 2018-01-24 ENCOUNTER — Encounter (HOSPITAL_COMMUNITY): Payer: BLUE CROSS/BLUE SHIELD

## 2018-01-24 NOTE — Telephone Encounter (Signed)
Patient called and stated she will not be attending class today due to personal reasons with her body. Will be here Wednesday.

## 2018-01-26 ENCOUNTER — Encounter (HOSPITAL_COMMUNITY): Payer: BLUE CROSS/BLUE SHIELD

## 2018-01-26 ENCOUNTER — Ambulatory Visit (HOSPITAL_COMMUNITY): Payer: BLUE CROSS/BLUE SHIELD

## 2018-01-28 ENCOUNTER — Telehealth (HOSPITAL_COMMUNITY): Payer: Self-pay

## 2018-01-28 ENCOUNTER — Ambulatory Visit (HOSPITAL_COMMUNITY): Payer: BLUE CROSS/BLUE SHIELD

## 2018-01-28 ENCOUNTER — Encounter (HOSPITAL_COMMUNITY): Payer: BLUE CROSS/BLUE SHIELD

## 2018-01-28 NOTE — Telephone Encounter (Signed)
Patient called and stated she will not be attending class today due to things going on with her body. Patient stated she did not want to explain what was going on over voicemail. Patient stated she will be back next week.

## 2018-01-31 ENCOUNTER — Ambulatory Visit (HOSPITAL_COMMUNITY): Payer: BLUE CROSS/BLUE SHIELD

## 2018-01-31 ENCOUNTER — Encounter (HOSPITAL_COMMUNITY): Payer: BLUE CROSS/BLUE SHIELD

## 2018-02-02 ENCOUNTER — Ambulatory Visit (HOSPITAL_COMMUNITY): Payer: BLUE CROSS/BLUE SHIELD

## 2018-02-02 ENCOUNTER — Encounter (HOSPITAL_COMMUNITY): Payer: BLUE CROSS/BLUE SHIELD

## 2018-02-03 ENCOUNTER — Telehealth (HOSPITAL_COMMUNITY): Payer: Self-pay | Admitting: *Deleted

## 2018-02-03 ENCOUNTER — Encounter (HOSPITAL_COMMUNITY): Payer: Self-pay | Admitting: *Deleted

## 2018-02-03 DIAGNOSIS — Z955 Presence of coronary angioplasty implant and graft: Secondary | ICD-10-CM

## 2018-02-03 DIAGNOSIS — I214 Non-ST elevation (NSTEMI) myocardial infarction: Secondary | ICD-10-CM

## 2018-02-03 NOTE — Progress Notes (Signed)
Cardiac Individual Treatment Plan  Patient Details  Name: [name] MRN: [id number] Date of Birth: 01/20/1966 Referring Provider:     [place] from 12/16/2017 in [place]  Referring Provider  [name] MD      Initial Encounter Date:    CARDIAC REHAB PHASE II ORIENTATION from 12/16/2017 in [place]  Date  12/16/17  Referring Provider  [name] MD      Visit Diagnosis: NSTEMI (non-ST elevated myocardial infarction) [facility])  Status post coronary artery stent placement  Patient's Home Medications on Admission:  Current Outpatient Medications:  .  amLODipine (NORVASC) 5 MG tablet, Take 5 mg by mouth every evening. , Disp: , Rfl:  .  aspirin 81 MG tablet, Take 81 mg by mouth every morning. , Disp: , Rfl:  .  atorvastatin (LIPITOR) 40 MG tablet, Take 40 mg by mouth daily., Disp: , Rfl: 1 .  carvedilol (COREG) 25 MG tablet, Take 1 tablet (25 mg total) by mouth 2 (two) times daily., Disp: 60 tablet, Rfl: 11 .  Cholecalciferol (VITAMIN D-3) 1000 UNITS CAPS, Take 1 capsule by mouth daily. , Disp: , Rfl:  .  Ferrous Sulfate (IRON) 325 (65 FE) MG TABS, Take 1 tablet by mouth 2 (two) times daily.  , Disp: , Rfl:  .  furosemide (LASIX) 40 MG tablet, Take 40 mg by mouth daily. , Disp: , Rfl:  .  losartan (COZAAR) 50 MG tablet, Take 1 tablet (50 mg total) by mouth daily., Disp: 30 tablet, Rfl: 11 .  metFORMIN (GLUCOPHAGE) 500 MG tablet, Take 1,000 mg by mouth 2 (two) times daily with a meal. , Disp: , Rfl:  .  nitroGLYCERIN (NITROSTAT) 0.4 MG SL tablet, Place 1 tablet (0.4 mg total) under the tongue every 5 (five) minutes x 3 doses as needed for chest pain., Disp: 30 tablet, Rfl: 1 .  ondansetron (ZOFRAN ODT) 4 MG disintegrating tablet, Take 1 tablet (4 mg total) by mouth every 8 (eight) hours as needed for nausea or vomiting., Disp: 10 tablet, Rfl: 0 .  ticagrelor (BRILINTA) 90  MG TABS tablet, Take 1 tablet (90 mg total) by mouth 2 (two) times daily., Disp: 60 tablet, Rfl: 3  Past Medical History: Past Medical History:  Diagnosis Date  . Anemia   . CHF (congestive heart failure) ([place]) 04/2011   EF 15-20% from echo 05/19/11  . DOE (dyspnea on exertion)   . DVT (deep venous thrombosis) ([place]) 06/2011   LLE  . Fatigue   . HTN (hypertension)   . Menorrhagia    with iron deficient anemia  . NSTEMI (non-ST elevated myocardial infarction) ([place]) 10/26/2017  . NSTEMI (non-ST elevated myocardial infarction) ([place]) 10/27/2017  . Obesity   . Orthopnea   . Type II diabetes mellitus ([facility])     Tobacco Use: Social History   Tobacco Use  Smoking Status Former Smoker  . Packs/day: 0.50  . Years: 5.00  . Pack years: 2.50  . Types: Cigarettes  . Last attempt to quit: 10/19/1993  . Years since quitting: 24.3  Smokeless Tobacco Never Used    Labs: Recent Chemical engineer    Labs for ITP Cardiac and Pulmonary Rehab Latest Ref Rng & Units 09/26/2011 04/15/2012 05/20/2013 10/27/2017 10/28/2017   Cholestrol 0 - 200 mg/dL - - - - 118   LDLCALC 0 - 99 mg/dL - - - - 65   HDL >40 mg/dL - - - -  31(L)   Trlycerides <150 mg/dL - - - - 111   Hemoglobin A1c 4.8 - 5.6 % - - - 10.2(H) -   TCO2 0 - 100 mmol/L 26 24 29  - -      Capillary Blood Glucose: Lab Results  Component Value Date   GLUCAP 210 (H) 01/21/2018   GLUCAP 140 (H) 01/17/2018   GLUCAP 162 (H) 01/07/2018   GLUCAP 178 (H) 01/05/2018   GLUCAP 174 (H) 12/31/2017     Exercise Target Goals:    Exercise Program Goal: Individual exercise prescription set using results from initial 6 min walk test and THRR while considering  patient's activity barriers and safety.   Exercise Prescription Goal: Initial exercise prescription builds to 30-45 minutes a day of aerobic activity, 2-3 days per week.  Home exercise guidelines will be given to patient during program as part of exercise prescription that the participant will  acknowledge.  Activity Barriers & Risk Stratification: Activity Barriers & Cardiac Risk Stratification - 12/16/17 0946      Activity Barriers & Cardiac Risk Stratification   Activity Barriers  Balance Concerns;Muscular Weakness;Deconditioning;Other (comment)    Comments  occ. L knee pain/discomfort    Cardiac Risk Stratification  High       6 Minute Walk: 6 Minute Walk    Row Name 12/16/17 1134         6 Minute Walk   Phase  Initial     Distance  1539 feet     Walk Time  6 minutes     # of Rest Breaks  0     MPH  2.91     METS  3.7     RPE  7     Perceived Dyspnea   0     VO2 Peak  13.1     Symptoms  No     Resting HR  86 bpm     Resting BP  104/62     Resting Oxygen Saturation   99 %     Exercise Oxygen Saturation  during 6 min walk  99 %     Max Ex. HR  136 bpm     Max Ex. BP  134/70     2 Minute Post BP  118/80        Oxygen Initial Assessment:   Oxygen Re-Evaluation:   Oxygen Discharge (Final Oxygen Re-Evaluation):   Initial Exercise Prescription: Initial Exercise Prescription - 12/16/17 1100      Date of Initial Exercise RX and Referring Provider   Date  12/16/17    Referring Provider  [name] MD      Treadmill   MPH  2    Grade  1    Minutes  10    METs  2.81      NuStep   Level  3    SPM  70    Minutes  10    METs  2.3      Arm Ergometer   Level  2    Watts  30    Minutes  10    METs  2.5      Prescription Details   Frequency (times per week)  3x    Duration  Progress to 30 minutes of continuous aerobic without signs/symptoms of physical distress      Intensity   THRR 40-80% of Max Heartrate  68-137    Ratings of Perceived Exertion  11-13    Perceived Dyspnea  0-4  Progression   Progression  Continue progressive overload as per policy without signs/symptoms or physical distress.      Resistance Training   Training Prescription  Yes    Weight  3lb    Reps  10-15       Perform Capillary Blood Glucose  checks as needed.  Exercise Prescription Changes:  Exercise Prescription Changes    Row Name 12/29/17 1520 01/14/18 1326 01/21/18 1334         Response to Exercise   Blood Pressure (Admit)  114/74  118/80  120/80     Blood Pressure (Exercise)  138/70  120/70  134/80     Blood Pressure (Exit)  108/72  126/84  108/62     Heart Rate (Admit)  101 bpm  85 bpm  81 bpm     Heart Rate (Exercise)  135 bpm  126 bpm  130 bpm     Heart Rate (Exit)  96 bpm  81 bpm  92 bpm     Rating of Perceived Exertion (Exercise)  11  11  11      Symptoms  none  none  none     Duration  Progress to 30 minutes of  aerobic without signs/symptoms of physical distress  Progress to 30 minutes of  aerobic without signs/symptoms of physical distress  Progress to 30 minutes of  aerobic without signs/symptoms of physical distress     Intensity  THRR unchanged  THRR unchanged  THRR unchanged       Progression   Progression  Continue to progress workloads to maintain intensity without signs/symptoms of physical distress.  Continue to progress workloads to maintain intensity without signs/symptoms of physical distress.  Continue to progress workloads to maintain intensity without signs/symptoms of physical distress.     Average METs  2.4  3.5  3       Resistance Training   Training Prescription  No Relaxation, no weights today.  Yes  No Patient left early, no weights today.     Weight  -  3lbs  -     Reps  -  10-15  -     Time  -  10 Minutes  -       Interval Training   Interval Training  No  No  No       Treadmill   MPH  -  -  2.5     Grade  -  -  1     Minutes  -  -  15     METs  -  -  3.26       NuStep   Level  4  4  4      SPM  70  70  70     Minutes  10  10  15      METs  2.4  2.9  2.8       Arm Ergometer   Level  2  3  -     Watts  30  67  -     Minutes  10  10  -     METs  2.4  4.03  -       Home Exercise Plan   Plans to continue exercise at  -  -  Home (comment)     Frequency  -  -  Add 3  additional days to program exercise sessions.     Initial Home Exercises Provided  -  -  01/17/18  Exercise Comments:  Exercise Comments    Row Name 01/04/18 1402 01/14/18 1409 01/17/18 1517 02/02/18 1707     Exercise Comments  Patient out since 12/31/17 when her resting heart rate was too high to exercise. Pt has seen cardiologist and plans to resume exercise this week.  Reviewed METs with patient.  Reviewed home exercise and goals with patient.  Patient absent since 01/21/18, unable to review METs and goals.       Exercise Goals and Review:  Exercise Goals    Row Name 12/16/17 0946             Exercise Goals   Increase Physical Activity  Yes       Intervention  Provide advice, education, support and counseling about physical activity/exercise needs.;Develop an individualized exercise prescription for aerobic and resistive training based on initial evaluation findings, risk stratification, comorbidities and participant's personal goals.       Expected Outcomes  Short Term: Attend rehab on a regular basis to increase amount of physical activity.;Long Term: Exercising regularly at least 3-5 days a week.;Long Term: Add in home exercise to make exercise part of routine and to increase amount of physical activity.       Increase Strength and Stamina  Yes       Intervention  Provide advice, education, support and counseling about physical activity/exercise needs.;Develop an individualized exercise prescription for aerobic and resistive training based on initial evaluation findings, risk stratification, comorbidities and participant's personal goals.       Expected Outcomes  Short Term: Increase workloads from initial exercise prescription for resistance, speed, and METs.;Short Term: Perform resistance training exercises routinely during rehab and add in resistance training at home;Long Term: Improve cardiorespiratory fitness, muscular endurance and strength as measured by increased METs  and functional capacity (6MWT)       Able to understand and use rate of perceived exertion (RPE) scale  Yes       Intervention  Provide education and explanation on how to use RPE scale       Expected Outcomes  Short Term: Able to use RPE daily in rehab to express subjective intensity level;Long Term:  Able to use RPE to guide intensity level when exercising independently       Knowledge and understanding of Target Heart Rate Range (THRR)  Yes       Intervention  Provide education and explanation of THRR including how the numbers were predicted and where they are located for reference       Expected Outcomes  Short Term: Able to state/look up THRR;Long Term: Able to use THRR to govern intensity when exercising independently;Short Term: Able to use daily as guideline for intensity in rehab       Able to check pulse independently  Yes       Intervention  Provide education and demonstration on how to check pulse in carotid and radial arteries.;Review the importance of being able to check your own pulse for safety during independent exercise       Expected Outcomes  Short Term: Able to explain why pulse checking is important during independent exercise;Long Term: Able to check pulse independently and accurately       Understanding of Exercise Prescription  Yes       Intervention  Provide education, explanation, and written materials on patient's individual exercise prescription       Expected Outcomes  Short Term: Able to explain program exercise prescription;Long Term: Able to explain home exercise prescription  to exercise independently          Exercise Goals Re-Evaluation : Exercise Goals Re-Evaluation    Row Name 01/04/18 1401 01/17/18 1517 02/02/18 1706         Exercise Goal Re-Evaluation   Exercise Goals Review  -  Understanding of Exercise Prescription;Knowledge and understanding of Target Heart Rate Range (THRR);Able to understand and use rate of perceived exertion (RPE) scale;Increase  Physical Activity  -     Comments  Patient's heart rate of 12/31/17 was elevated in the 130s at rest. No exercise, cardiologist contacted.  Reviewed home exercise guidelines with patient including THRR, RPE scale, and endpoints for exercise. Pt is walking 30 minutes, 3 days/ week as her mode of home exercise.  Patient has been absent since 01/21/18. Unable to review goals.     Expected Outcomes  -  Patient will continue walking 3 days/ week in addition to exercise at cardiac rehab to help achieve health and fitness goals.  Patient will continue walking 3 days/ week in addition to exercise at cardiac rehab to help achieve health and fitness goals.         Discharge Exercise Prescription (Final Exercise Prescription Changes): Exercise Prescription Changes - 01/21/18 1334      Response to Exercise   Blood Pressure (Admit)  120/80    Blood Pressure (Exercise)  134/80    Blood Pressure (Exit)  108/62    Heart Rate (Admit)  81 bpm    Heart Rate (Exercise)  130 bpm    Heart Rate (Exit)  92 bpm    Rating of Perceived Exertion (Exercise)  11    Symptoms  none    Duration  Progress to 30 minutes of  aerobic without signs/symptoms of physical distress    Intensity  THRR unchanged      Progression   Progression  Continue to progress workloads to maintain intensity without signs/symptoms of physical distress.    Average METs  3      Resistance Training   Training Prescription  No Patient left early, no weights today.      Interval Training   Interval Training  No      Treadmill   MPH  2.5    Grade  1    Minutes  15    METs  3.26      NuStep   Level  4    SPM  70    Minutes  15    METs  2.8      Home Exercise Plan   Plans to continue exercise at  Home (comment)    Frequency  Add 3 additional days to program exercise sessions.    Initial Home Exercises Provided  01/17/18       Nutrition:  Target Goals: Understanding of nutrition guidelines, daily intake of sodium 1500mg ,  cholesterol 200mg , calories 30% from fat and 7% or less from saturated fats, daily to have 5 or more servings of fruits and vegetables.  Biometrics: Pre Biometrics - 12/16/17 1140      Pre Biometrics   Height  5\' 2"  (1.575 m)        Nutrition Therapy Plan and Nutrition Goals: Nutrition Therapy & Goals - 12/16/17 1219      Nutrition Therapy   Diet  Carb Modified, Heart Healthy      Personal Nutrition Goals   Nutrition Goal  Pt to identify food quantities necessary to achieve weight loss of 6-24 lb (2.7-10.9 kg) at graduation from  cardiac rehab.    Personal Goal #2  Improved blood glucose control as evidenced by pt's A1c trending from 10.2 toward less than 7.0.      Intervention Plan   Intervention  Prescribe, educate and counsel regarding individualized specific dietary modifications aiming towards targeted core components such as weight, hypertension, lipid management, diabetes, heart failure and other comorbidities.    Expected Outcomes  Short Term Goal: Understand basic principles of dietary content, such as calories, fat, sodium, cholesterol and nutrients.;Long Term Goal: Adherence to prescribed nutrition plan.       Nutrition Assessments: Nutrition Assessments - 12/16/17 1219      MEDFICTS Scores   Pre Score  9       Nutrition Goals Re-Evaluation:   Nutrition Goals Re-Evaluation:   Nutrition Goals Discharge (Final Nutrition Goals Re-Evaluation):   Psychosocial: Target Goals: Acknowledge presence or absence of significant depression and/or stress, maximize coping skills, provide positive support system. Participant is able to verbalize types and ability to use techniques and skills needed for reducing stress and depression.  Initial Review & Psychosocial Screening: Initial Psych Review & Screening - 12/16/17 1236      Initial Review   Current issues with  None Identified      Family Dynamics   Good Support System?  Yes    Comments  Pt is very excited to  learn more on how to live a heart healty lifestyle.      Barriers   Psychosocial barriers to participate in program  The patient should benefit from training in stress management and relaxation.;There are no identifiable barriers or psychosocial needs.      Screening Interventions   Interventions  Encouraged to exercise    Expected Outcomes  Short Term goal: Identification and review with participant of any Quality of Life or Depression concerns found by scoring the questionnaire.;Long Term goal: The participant improves quality of Life and PHQ9 Scores as seen by post scores and/or verbalization of changes       Quality of Life Scores: Quality of Life - 12/16/17 1139      Quality of Life Scores   Health/Function Pre  28.96 %    Socioeconomic Pre  28.29 %    Psych/Spiritual Pre  30 %    Family Pre  28.5 %    GLOBAL Pre  28.98 %      Scores of 19 and below usually indicate a poorer quality of life in these areas.  A difference of  2-3 points is a clinically meaningful difference.  A difference of 2-3 points in the total score of the Quality of Life Index has been associated with significant improvement in overall quality of life, self-image, physical symptoms, and general health in studies assessing change in quality of life.  PHQ-9: Recent Review Flowsheet Data    Depression screen [facility] 2/9 12/22/2017   Decreased Interest 0   Down, Depressed, Hopeless 0   PHQ - 2 Score 0     Interpretation of Total Score  Total Score Depression Severity:  1-4 = Minimal depression, 5-9 = Mild depression, 10-14 = Moderate depression, 15-19 = Moderately severe depression, 20-27 = Severe depression   Psychosocial Evaluation and Intervention:   Psychosocial Re-Evaluation: Psychosocial Re-Evaluation    [place] Name 01/06/18 1508 02/03/18 1218           Psychosocial Re-Evaluation   Current issues with  None Identified  None Identified      Interventions  Encouraged to attend Cardiac Rehabilitation  for the exercise;Stress management education  Encouraged to attend Cardiac Rehabilitation for the exercise;Stress management education      Continue Psychosocial Services   No Follow up required  No Follow up required         Psychosocial Discharge (Final Psychosocial Re-Evaluation): Psychosocial Re-Evaluation - 02/03/18 1218      Psychosocial Re-Evaluation   Current issues with  None Identified    Interventions  Encouraged to attend Cardiac Rehabilitation for the exercise;Stress management education    Continue Psychosocial Services   No Follow up required       Vocational Rehabilitation: Provide vocational rehab assistance to qualifying candidates.   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       Education: Education Goals: Education classes will be provided on a weekly basis, covering required topics. Participant will state understanding/return demonstration of topics presented.  Learning Barriers/Preferences: Learning Barriers/Preferences - 12/16/17 0946      Learning Barriers/Preferences   Learning Barriers  Sight    Learning Preferences  Skilled Demonstration;Verbal Instruction;Video;Written Material       Education Topics: Count Your Pulse:  -Group instruction provided by verbal instruction, demonstration, patient participation and written materials to support subject.  Instructors address importance of being able to find your pulse and how to count your pulse when at home without a heart monitor.  Patients get hands on experience counting their pulse with staff help and individually.   Heart Attack, Angina, and Risk Factor Modification:  -Group instruction provided by verbal instruction, video, and written materials to support subject.  Instructors  address signs and symptoms of angina and heart attacks.    Also discuss risk factors for heart disease and how to make changes to improve heart health risk factors.   Functional Fitness:  -Group instruction provided by verbal instruction, demonstration, patient participation, and written materials to support subject.  Instructors address safety measures for doing things around the house.  Discuss how to get up and down off the floor, how to pick things up properly, how to safely get out of a chair without assistance, and balance training.   Meditation and Mindfulness:  -Group instruction provided by verbal instruction, patient participation, and written materials to support subject.  Instructor addresses importance of mindfulness and meditation practice to help reduce stress and improve awareness.  Instructor also leads participants through a meditation exercise.    Stretching for Flexibility and Mobility:  -Group instruction provided by verbal instruction, patient participation, and written materials to support subject.  Instructors lead participants through series of stretches that are designed to increase flexibility thus improving mobility.  These stretches are additional exercise for major muscle groups that are typically performed during regular warm up and cool down.   Hands Only CPR:  -Group verbal, video, and participation provides a basic overview of AHA guidelines for community CPR. Role-play of emergencies allow participants the opportunity to practice calling for help and chest compression technique with discussion of AED use.   Hypertension: -Group verbal and written instruction that provides a basic overview of hypertension including the most recent diagnostic guidelines, risk factor reduction with self-care instructions and medication management.    Nutrition I class: Heart Healthy Eating:  -Group instruction provided by PowerPoint slides, verbal discussion, and written  materials to support subject matter.  The instructor gives an explanation and review of the Therapeutic Lifestyle Changes diet recommendations, which includes a discussion on lipid goals, dietary fat, sodium, fiber, plant stanol/sterol esters, sugar, and the components of a well-balanced, healthy diet.   Nutrition II class: Lifestyle Skills:  -Group instruction provided by PowerPoint slides, verbal discussion, and written materials to support subject matter. The instructor gives an explanation and review of label reading, grocery shopping for heart health, heart healthy recipe modifications, and ways to make healthier choices when eating out.   Diabetes Question & Answer:  -Group instruction provided by PowerPoint slides, verbal discussion, and written materials to support subject matter. The instructor gives an explanation and review of diabetes co-morbidities, pre- and post-prandial blood glucose goals, pre-exercise blood glucose goals, signs, symptoms, and treatment of hypoglycemia and hyperglycemia, and foot care basics.   Diabetes Blitz:  -Group instruction provided by PowerPoint slides, verbal discussion, and written materials to support subject matter. The instructor gives an explanation and review of the physiology behind type 1 and type 2 diabetes, diabetes medications and rational behind using different medications, pre- and post-prandial blood glucose recommendations and Hemoglobin A1c goals, diabetes diet, and exercise including blood glucose guidelines for exercising safely.    Portion Distortion:  -Group instruction provided by PowerPoint slides, verbal discussion, written materials, and food models to support subject matter. The instructor gives an explanation of serving size versus portion size, changes in portions sizes over the last 20 years, and what consists of a serving from each food group.   Stress Management:  -Group instruction provided by verbal instruction, video, and  written materials to support subject matter.  Instructors review role of stress in heart disease and how to cope with stress positively.     Exercising on Your Own:  -Group instruction provided by verbal instruction, power point, and written materials to support subject.  Instructors discuss benefits of exercise, components of exercise, frequency and intensity of exercise, and end points for exercise.  Also discuss use of nitroglycerin and activating EMS.  Review options of places to exercise outside of rehab.  Review guidelines for sex with heart disease.   CARDIAC REHAB PHASE II EXERCISE from 12/22/2017 in [place]  Date  12/22/17  Instruction Review Code  2- Demonstrated Understanding      Cardiac Drugs I:  -Group instruction provided by verbal instruction and written materials to support subject.  Instructor reviews cardiac drug classes: antiplatelets, anticoagulants, beta blockers, and statins.  Instructor discusses reasons, side effects, and lifestyle considerations for each drug class.   Cardiac Drugs II:  -Group instruction provided by verbal instruction and written materials to support subject.  Instructor reviews cardiac drug classes: angiotensin converting enzyme inhibitors (ACE-I), angiotensin II receptor blockers (ARBs), nitrates, and calcium channel blockers.  Instructor discusses reasons, side effects, and lifestyle considerations for each drug class.   Anatomy and Physiology of the Circulatory System:  Group verbal and written instruction and models provide basic cardiac anatomy and physiology, with the coronary electrical and arterial systems. Review of: AMI, Angina, Valve disease, Heart Failure, Peripheral Artery Disease, Cardiac Arrhythmia, Pacemakers, and the ICD.   Other Education:  -Group or individual verbal, written, or video instructions that support the educational goals of the cardiac rehab program.   Holiday Eating Survival Tips:   -Group instruction provided by PowerPoint slides, verbal discussion, and written materials to support subject matter. The instructor gives patients tips, tricks, and techniques to help them not only survive but  enjoy the holidays despite the onslaught of food that accompanies the holidays.   Knowledge Questionnaire Score: Knowledge Questionnaire Score - 12/16/17 1138      Knowledge Questionnaire Score   Pre Score  21/24       Core Components/Risk Factors/Patient Goals at Admission: Personal Goals and Risk Factors at Admission - 12/16/17 1131      Core Components/Risk Factors/Patient Goals on Admission   Diabetes  Yes    Intervention  Provide education about signs/symptoms and action to take for hypo/hyperglycemia.;Provide education about proper nutrition, including hydration, and aerobic/resistive exercise prescription along with prescribed medications to achieve blood glucose in normal ranges: Fasting glucose 65-99 mg/dL    Expected Outcomes  Short Term: Participant verbalizes understanding of the signs/symptoms and immediate care of hyper/hypoglycemia, proper foot care and importance of medication, aerobic/resistive exercise and nutrition plan for blood glucose control.;Long Term: Attainment of HbA1C < 7%.    Hypertension  Yes    Intervention  Provide education on lifestyle modifcations including regular physical activity/exercise, weight management, moderate sodium restriction and increased consumption of fresh fruit, vegetables, and low fat dairy, alcohol moderation, and smoking cessation.;Monitor prescription use compliance.    Expected Outcomes  Short Term: Continued assessment and intervention until BP is < 140/71mm HG in hypertensive participants. < 130/66mm HG in hypertensive participants with diabetes, heart failure or chronic kidney disease.;Long Term: Maintenance of blood pressure at goal levels.    Lipids  Yes    Intervention  Provide education and support for participant on  nutrition & aerobic/resistive exercise along with prescribed medications to achieve LDL 70mg , HDL >40mg .    Expected Outcomes  Short Term: Participant states understanding of desired cholesterol values and is compliant with medications prescribed. Participant is following exercise prescription and nutrition guidelines.;Long Term: Cholesterol controlled with medications as prescribed, with individualized exercise RX and with personalized nutrition plan. Value goals: LDL < 70mg , HDL > 40 mg.       Core Components/Risk Factors/Patient Goals Review:  Goals and Risk Factor Review    Row Name 01/06/18 1503 02/03/18 1217           Core Components/Risk Factors/Patient Goals Review   Personal Goals Review  Weight Management/Obesity;Diabetes;Hypertension;Lipids  Weight Management/Obesity;Diabetes;Hypertension;Lipids      Review  Vanellope is off to a good start to exercise. Josefina has been advised to come twice a week due to her work schedule.  Samyiah's vital signs have been stable at cardiac rehab when in attendance      Expected Outcomes  Ketra will particpate in phase 2 cardiac rehab and take her medications as presribed.   Erum will particpate in phase 2 cardiac rehab and take her medications as presribed.          Core Components/Risk Factors/Patient Goals at Discharge (Final Review):  Goals and Risk Factor Review - 02/03/18 1217      Core Components/Risk Factors/Patient Goals Review   Personal Goals Review  Weight Management/Obesity;Diabetes;Hypertension;Lipids    Review  Elene's vital signs have been stable at cardiac rehab when in attendance    Expected Outcomes  Talyia will particpate in phase 2 cardiac rehab and take her medications as presribed.        ITP Comments: ITP Comments    Row Name 12/16/17 0935 01/06/18 1502 02/03/18 1216       ITP Comments  Dr. Fransico Him, Medical Director  30 Day ITP review. Annalaura is off to a good start to exercise  30 Day ITP review.  Tiphani's attendance has been sporadic. Charlean's last day of attendance was on 01/21/18        Comments: See ITP comments.Barnet Pall, RN,BSN 02/03/2018 12:22 PM

## 2018-02-04 ENCOUNTER — Ambulatory Visit (HOSPITAL_COMMUNITY): Payer: BLUE CROSS/BLUE SHIELD

## 2018-02-04 ENCOUNTER — Encounter (HOSPITAL_COMMUNITY): Payer: BLUE CROSS/BLUE SHIELD

## 2018-02-07 ENCOUNTER — Encounter (HOSPITAL_COMMUNITY): Payer: BLUE CROSS/BLUE SHIELD

## 2018-02-07 ENCOUNTER — Ambulatory Visit (HOSPITAL_COMMUNITY): Payer: BLUE CROSS/BLUE SHIELD

## 2018-02-09 ENCOUNTER — Ambulatory Visit (HOSPITAL_COMMUNITY): Payer: BLUE CROSS/BLUE SHIELD

## 2018-02-09 ENCOUNTER — Encounter (HOSPITAL_COMMUNITY): Payer: BLUE CROSS/BLUE SHIELD

## 2018-02-11 ENCOUNTER — Ambulatory Visit (HOSPITAL_COMMUNITY): Payer: BLUE CROSS/BLUE SHIELD

## 2018-02-11 ENCOUNTER — Encounter (HOSPITAL_COMMUNITY): Payer: BLUE CROSS/BLUE SHIELD

## 2018-02-14 ENCOUNTER — Encounter (HOSPITAL_COMMUNITY): Payer: BLUE CROSS/BLUE SHIELD

## 2018-02-14 ENCOUNTER — Ambulatory Visit (HOSPITAL_COMMUNITY): Payer: BLUE CROSS/BLUE SHIELD

## 2018-02-16 ENCOUNTER — Ambulatory Visit (HOSPITAL_COMMUNITY): Payer: BLUE CROSS/BLUE SHIELD

## 2018-02-16 ENCOUNTER — Encounter (HOSPITAL_COMMUNITY): Payer: BLUE CROSS/BLUE SHIELD

## 2018-02-18 ENCOUNTER — Ambulatory Visit (HOSPITAL_COMMUNITY): Payer: BLUE CROSS/BLUE SHIELD

## 2018-02-18 ENCOUNTER — Encounter (HOSPITAL_COMMUNITY): Payer: BLUE CROSS/BLUE SHIELD

## 2018-02-21 ENCOUNTER — Ambulatory Visit (HOSPITAL_COMMUNITY): Payer: BLUE CROSS/BLUE SHIELD

## 2018-02-21 ENCOUNTER — Encounter (HOSPITAL_COMMUNITY): Payer: BLUE CROSS/BLUE SHIELD

## 2018-02-23 ENCOUNTER — Ambulatory Visit (HOSPITAL_COMMUNITY): Payer: BLUE CROSS/BLUE SHIELD

## 2018-02-23 ENCOUNTER — Encounter (HOSPITAL_COMMUNITY): Payer: BLUE CROSS/BLUE SHIELD

## 2018-02-25 ENCOUNTER — Ambulatory Visit (HOSPITAL_COMMUNITY): Payer: BLUE CROSS/BLUE SHIELD

## 2018-02-25 ENCOUNTER — Encounter (HOSPITAL_COMMUNITY): Payer: BLUE CROSS/BLUE SHIELD

## 2018-02-28 ENCOUNTER — Encounter (HOSPITAL_COMMUNITY): Payer: BLUE CROSS/BLUE SHIELD

## 2018-02-28 ENCOUNTER — Ambulatory Visit (HOSPITAL_COMMUNITY): Payer: BLUE CROSS/BLUE SHIELD

## 2018-03-02 ENCOUNTER — Ambulatory Visit (HOSPITAL_COMMUNITY): Payer: BLUE CROSS/BLUE SHIELD

## 2018-03-02 ENCOUNTER — Encounter (HOSPITAL_COMMUNITY): Payer: BLUE CROSS/BLUE SHIELD

## 2018-03-04 ENCOUNTER — Ambulatory Visit (HOSPITAL_COMMUNITY): Payer: BLUE CROSS/BLUE SHIELD

## 2018-03-04 ENCOUNTER — Encounter (HOSPITAL_COMMUNITY): Payer: BLUE CROSS/BLUE SHIELD

## 2018-03-05 DIAGNOSIS — E785 Hyperlipidemia, unspecified: Secondary | ICD-10-CM | POA: Insufficient documentation

## 2018-03-07 ENCOUNTER — Ambulatory Visit (HOSPITAL_COMMUNITY): Payer: BLUE CROSS/BLUE SHIELD

## 2018-03-07 ENCOUNTER — Encounter (HOSPITAL_COMMUNITY): Payer: BLUE CROSS/BLUE SHIELD

## 2018-03-09 ENCOUNTER — Ambulatory Visit (HOSPITAL_COMMUNITY): Payer: BLUE CROSS/BLUE SHIELD

## 2018-03-09 ENCOUNTER — Encounter (HOSPITAL_COMMUNITY): Payer: BLUE CROSS/BLUE SHIELD

## 2018-03-11 ENCOUNTER — Ambulatory Visit (HOSPITAL_COMMUNITY): Payer: BLUE CROSS/BLUE SHIELD

## 2018-03-11 ENCOUNTER — Encounter (HOSPITAL_COMMUNITY): Payer: BLUE CROSS/BLUE SHIELD

## 2018-03-16 ENCOUNTER — Ambulatory Visit (HOSPITAL_COMMUNITY): Payer: BLUE CROSS/BLUE SHIELD

## 2018-03-16 ENCOUNTER — Encounter (HOSPITAL_COMMUNITY): Payer: BLUE CROSS/BLUE SHIELD

## 2018-03-18 ENCOUNTER — Encounter (HOSPITAL_COMMUNITY): Payer: BLUE CROSS/BLUE SHIELD

## 2018-03-18 ENCOUNTER — Ambulatory Visit (HOSPITAL_COMMUNITY): Payer: BLUE CROSS/BLUE SHIELD

## 2018-03-18 ENCOUNTER — Encounter (HOSPITAL_COMMUNITY): Payer: Self-pay | Admitting: *Deleted

## 2018-03-18 DIAGNOSIS — Z955 Presence of coronary angioplasty implant and graft: Secondary | ICD-10-CM

## 2018-03-18 DIAGNOSIS — I214 Non-ST elevation (NSTEMI) myocardial infarction: Secondary | ICD-10-CM

## 2018-03-18 NOTE — Addendum Note (Signed)
Encounter addended by: Sol Passer on: 03/18/2018 8:51 AM  Actions taken: Flowsheet data copied forward, Visit Navigator Flowsheet section accepted, Vitals modified

## 2018-03-18 NOTE — Progress Notes (Signed)
Discharge Progress Report  Patient Details  Name: [name] MRN: [id number] Date of Birth: November 02, 1965 Referring Provider:     [facility] from 12/16/2017 in [place]  Referring Provider  [name] MD       Number of Visits: 8  Reason for Discharge:  Early Exit:  Lack of attendance  Smoking History:  Social History   Tobacco Use  Smoking Status Former Smoker  . Packs/day: 0.50  . Years: 5.00  . Pack years: 2.50  . Types: Cigarettes  . Last attempt to quit: 10/19/1993  . Years since quitting: 24.4  Smokeless Tobacco Never Used    Diagnosis:  NSTEMI (non-ST elevated myocardial infarction) ([place])  Status post coronary artery stent placement  ADL [facility]:   Initial Exercise Prescription:   Discharge Exercise Prescription (Final Exercise Prescription Changes): Exercise Prescription Changes - 01/21/18 1334      Response to Exercise   Blood Pressure (Admit)  120/80    Blood Pressure (Exercise)  134/80    Blood Pressure (Exit)  108/62    Heart Rate (Admit)  81 bpm    Heart Rate (Exercise)  130 bpm    Heart Rate (Exit)  92 bpm    Rating of Perceived Exertion (Exercise)  11    Symptoms  none    Duration  Progress to 30 minutes of  aerobic without signs/symptoms of physical distress    Intensity  THRR unchanged      Progression   Progression  Continue to progress workloads to maintain intensity without signs/symptoms of physical distress.    Average METs  3      Resistance Training   Training Prescription  No Patient left early, no weights today.      Interval Training   Interval Training  No      Treadmill   MPH  2.5    Grade  1    Minutes  15    METs  3.26      NuStep   Level  4    SPM  70    Minutes  15    METs  2.8      Home Exercise Plan   Plans to continue exercise at  Home (comment)    Frequency  Add 3 additional days to program exercise sessions.    Initial Home Exercises  Provided  01/17/18       Functional Capacity:   Psychological, QOL, Others - Outcomes: PHQ 2/9: Depression screen PHQ 2/9 12/22/2017  Decreased Interest 0  Down, Depressed, Hopeless 0  PHQ - 2 Score 0    Quality of Life:   Personal Goals: Goals established at orientation with interventions provided to work toward goal.    Personal Goals Discharge: Goals and Risk Factor Review    Row Name 02/03/18 1217             Core Components/Risk Factors/Patient Goals Review   Personal Goals Review  Weight Management/Obesity;Diabetes;Hypertension;Lipids       Review  [name]'s vital signs have been stable at cardiac rehab when in attendance       Expected Outcomes  [name] will particpate in phase 2 cardiac rehab and take her medications as presribed.           Exercise Goals and Review:   Nutrition & Weight - Outcomes:  Post Biometrics - 01/21/18 1334       Post  Biometrics   Weight  247  lb 9.2 oz (112.3 kg)       Nutrition:   Nutrition Discharge:   Education Questionnaire Score:   [name] attended 8 exercise sessions between  12/16/17-01/21/18.[name]'s attendance was poor due to her work schedule. [name] was discharged from cardiac rehab due to nonattendance.Courtney Pall, RN,BSN 03/18/2018 2:05 PM

## 2018-03-21 ENCOUNTER — Encounter (HOSPITAL_COMMUNITY): Payer: BLUE CROSS/BLUE SHIELD

## 2018-03-21 ENCOUNTER — Ambulatory Visit (HOSPITAL_COMMUNITY): Payer: BLUE CROSS/BLUE SHIELD

## 2018-03-23 ENCOUNTER — Ambulatory Visit (HOSPITAL_COMMUNITY): Payer: BLUE CROSS/BLUE SHIELD

## 2018-03-25 ENCOUNTER — Encounter (HOSPITAL_COMMUNITY): Payer: BLUE CROSS/BLUE SHIELD

## 2018-03-28 ENCOUNTER — Ambulatory Visit (HOSPITAL_COMMUNITY): Payer: BLUE CROSS/BLUE SHIELD

## 2018-11-27 ENCOUNTER — Other Ambulatory Visit: Payer: Self-pay | Admitting: Cardiology

## 2018-11-27 DIAGNOSIS — I2510 Atherosclerotic heart disease of native coronary artery without angina pectoris: Secondary | ICD-10-CM

## 2018-11-29 ENCOUNTER — Telehealth: Payer: Self-pay

## 2018-11-29 NOTE — Telephone Encounter (Signed)
-----   Message from [name], MD sent at 11/29/2018 12:51 PM EST ----- Regarding: I declined Brilinta refill Patient has not seen me since 12/2017. She was recommended aspirin and Brilinta till 10/2018. Continuation of Brilinta beyond the recommend duration requires face to face discussion regarding risks and benefits. Please check with front desk re: appt with me.  Thanks MJP

## 2019-01-23 ENCOUNTER — Other Ambulatory Visit: Payer: Self-pay

## 2019-01-23 DIAGNOSIS — I2510 Atherosclerotic heart disease of native coronary artery without angina pectoris: Secondary | ICD-10-CM

## 2019-01-23 MED ORDER — TICAGRELOR 90 MG PO TABS: 90.0000 mg | ORAL_TABLET | Freq: Two times a day (BID) | ORAL | 0 refills | Status: DC

## 2019-04-21 ENCOUNTER — Other Ambulatory Visit: Payer: Self-pay | Admitting: Cardiology

## 2019-04-21 DIAGNOSIS — I2510 Atherosclerotic heart disease of native coronary artery without angina pectoris: Secondary | ICD-10-CM

## 2019-05-22 DIAGNOSIS — M545 Low back pain: Secondary | ICD-10-CM | POA: Diagnosis not present

## 2019-05-22 DIAGNOSIS — M25552 Pain in left hip: Secondary | ICD-10-CM | POA: Diagnosis not present

## 2019-05-31 DIAGNOSIS — M4316 Spondylolisthesis, lumbar region: Secondary | ICD-10-CM | POA: Diagnosis not present

## 2019-05-31 DIAGNOSIS — M545 Low back pain: Secondary | ICD-10-CM | POA: Diagnosis not present

## 2019-06-07 DIAGNOSIS — M545 Low back pain: Secondary | ICD-10-CM | POA: Diagnosis not present

## 2019-06-07 DIAGNOSIS — M4316 Spondylolisthesis, lumbar region: Secondary | ICD-10-CM | POA: Diagnosis not present

## 2019-06-15 DIAGNOSIS — I2510 Atherosclerotic heart disease of native coronary artery without angina pectoris: Secondary | ICD-10-CM | POA: Diagnosis not present

## 2019-06-15 DIAGNOSIS — E119 Type 2 diabetes mellitus without complications: Secondary | ICD-10-CM | POA: Diagnosis not present

## 2019-06-15 DIAGNOSIS — I10 Essential (primary) hypertension: Secondary | ICD-10-CM | POA: Diagnosis not present

## 2019-06-15 DIAGNOSIS — E785 Hyperlipidemia, unspecified: Secondary | ICD-10-CM | POA: Diagnosis not present

## 2019-06-15 DIAGNOSIS — I252 Old myocardial infarction: Secondary | ICD-10-CM | POA: Diagnosis not present

## 2019-06-15 DIAGNOSIS — R7989 Other specified abnormal findings of blood chemistry: Secondary | ICD-10-CM | POA: Diagnosis not present

## 2019-07-21 DIAGNOSIS — I252 Old myocardial infarction: Secondary | ICD-10-CM | POA: Diagnosis not present

## 2019-07-21 DIAGNOSIS — I2510 Atherosclerotic heart disease of native coronary artery without angina pectoris: Secondary | ICD-10-CM | POA: Diagnosis not present

## 2019-07-21 DIAGNOSIS — E785 Hyperlipidemia, unspecified: Secondary | ICD-10-CM | POA: Diagnosis not present

## 2019-07-21 DIAGNOSIS — J0100 Acute maxillary sinusitis, unspecified: Secondary | ICD-10-CM | POA: Diagnosis not present

## 2019-09-02 ENCOUNTER — Other Ambulatory Visit: Payer: Self-pay | Admitting: *Deleted

## 2019-09-02 DIAGNOSIS — Z20822 Contact with and (suspected) exposure to covid-19: Secondary | ICD-10-CM

## 2019-09-02 DIAGNOSIS — Z20828 Contact with and (suspected) exposure to other viral communicable diseases: Secondary | ICD-10-CM

## 2019-09-05 LAB — NOVEL CORONAVIRUS, NAA: SARS-CoV-2, NAA: NOT DETECTED

## 2019-09-12 DIAGNOSIS — Z1231 Encounter for screening mammogram for malignant neoplasm of breast: Secondary | ICD-10-CM | POA: Diagnosis not present

## 2019-09-12 DIAGNOSIS — Z1389 Encounter for screening for other disorder: Secondary | ICD-10-CM | POA: Diagnosis not present

## 2019-09-12 DIAGNOSIS — Z130 Encounter for screening for diseases of the blood and blood-forming organs and certain disorders involving the immune mechanism: Secondary | ICD-10-CM | POA: Diagnosis not present

## 2019-09-12 DIAGNOSIS — Z6841 Body Mass Index (BMI) 40.0 and over, adult: Secondary | ICD-10-CM | POA: Diagnosis not present

## 2019-09-12 DIAGNOSIS — Z01419 Encounter for gynecological examination (general) (routine) without abnormal findings: Secondary | ICD-10-CM | POA: Diagnosis not present

## 2019-09-27 ENCOUNTER — Other Ambulatory Visit: Payer: Self-pay | Admitting: Cardiology

## 2019-09-27 DIAGNOSIS — I2510 Atherosclerotic heart disease of native coronary artery without angina pectoris: Secondary | ICD-10-CM

## 2019-11-08 DIAGNOSIS — E119 Type 2 diabetes mellitus without complications: Secondary | ICD-10-CM | POA: Diagnosis not present

## 2019-11-08 DIAGNOSIS — I2510 Atherosclerotic heart disease of native coronary artery without angina pectoris: Secondary | ICD-10-CM | POA: Diagnosis not present

## 2019-11-08 DIAGNOSIS — I252 Old myocardial infarction: Secondary | ICD-10-CM | POA: Diagnosis not present

## 2019-11-08 DIAGNOSIS — I10 Essential (primary) hypertension: Secondary | ICD-10-CM | POA: Diagnosis not present

## 2019-11-16 ENCOUNTER — Inpatient Hospital Stay (HOSPITAL_COMMUNITY)
Admission: EM | Admit: 2019-11-16 | Discharge: 2019-11-17 | DRG: 287 | Disposition: A | Discharge status: Home / Self Care | Payer: BLUE CROSS/BLUE SHIELD | Attending: Cardiology | Admitting: Cardiology

## 2019-11-16 ENCOUNTER — Inpatient Hospital Stay (HOSPITAL_COMMUNITY): Payer: BLUE CROSS/BLUE SHIELD

## 2019-11-16 ENCOUNTER — Other Ambulatory Visit: Payer: Self-pay

## 2019-11-16 ENCOUNTER — Emergency Department (HOSPITAL_COMMUNITY): Payer: BLUE CROSS/BLUE SHIELD

## 2019-11-16 ENCOUNTER — Encounter (HOSPITAL_COMMUNITY): Payer: Self-pay | Admitting: Emergency Medicine

## 2019-11-16 DIAGNOSIS — Z86718 Personal history of other venous thrombosis and embolism: Secondary | ICD-10-CM | POA: Diagnosis not present

## 2019-11-16 DIAGNOSIS — Z79899 Other long term (current) drug therapy: Secondary | ICD-10-CM

## 2019-11-16 DIAGNOSIS — G4733 Obstructive sleep apnea (adult) (pediatric): Secondary | ICD-10-CM | POA: Diagnosis present

## 2019-11-16 DIAGNOSIS — Z87891 Personal history of nicotine dependence: Secondary | ICD-10-CM

## 2019-11-16 DIAGNOSIS — Z6841 Body Mass Index (BMI) 40.0 and over, adult: Secondary | ICD-10-CM | POA: Diagnosis not present

## 2019-11-16 DIAGNOSIS — R0789 Other chest pain: Principal | ICD-10-CM | POA: Diagnosis present

## 2019-11-16 DIAGNOSIS — I10 Essential (primary) hypertension: Secondary | ICD-10-CM | POA: Diagnosis not present

## 2019-11-16 DIAGNOSIS — Z20822 Contact with and (suspected) exposure to covid-19: Secondary | ICD-10-CM | POA: Diagnosis present

## 2019-11-16 DIAGNOSIS — Z8249 Family history of ischemic heart disease and other diseases of the circulatory system: Secondary | ICD-10-CM

## 2019-11-16 DIAGNOSIS — I200 Unstable angina: Secondary | ICD-10-CM

## 2019-11-16 DIAGNOSIS — E669 Obesity, unspecified: Secondary | ICD-10-CM | POA: Diagnosis present

## 2019-11-16 DIAGNOSIS — Z7982 Long term (current) use of aspirin: Secondary | ICD-10-CM

## 2019-11-16 DIAGNOSIS — I252 Old myocardial infarction: Secondary | ICD-10-CM

## 2019-11-16 DIAGNOSIS — R079 Chest pain, unspecified: Secondary | ICD-10-CM

## 2019-11-16 DIAGNOSIS — I509 Heart failure, unspecified: Secondary | ICD-10-CM | POA: Diagnosis present

## 2019-11-16 DIAGNOSIS — E1165 Type 2 diabetes mellitus with hyperglycemia: Secondary | ICD-10-CM | POA: Diagnosis present

## 2019-11-16 DIAGNOSIS — E785 Hyperlipidemia, unspecified: Secondary | ICD-10-CM | POA: Diagnosis present

## 2019-11-16 DIAGNOSIS — Z888 Allergy status to other drugs, medicaments and biological substances status: Secondary | ICD-10-CM

## 2019-11-16 DIAGNOSIS — I2510 Atherosclerotic heart disease of native coronary artery without angina pectoris: Secondary | ICD-10-CM | POA: Diagnosis present

## 2019-11-16 DIAGNOSIS — I110 Hypertensive heart disease with heart failure: Secondary | ICD-10-CM | POA: Diagnosis present

## 2019-11-16 DIAGNOSIS — R0602 Shortness of breath: Secondary | ICD-10-CM | POA: Diagnosis not present

## 2019-11-16 DIAGNOSIS — Z955 Presence of coronary angioplasty implant and graft: Secondary | ICD-10-CM

## 2019-11-16 LAB — CBC WITH DIFFERENTIAL/PLATELET
Abs Immature Granulocytes: 0.02 10*3/uL (ref 0.00–0.07)
Basophils Absolute: 0 10*3/uL (ref 0.0–0.1)
Basophils Relative: 0 %
Eosinophils Absolute: 0.1 10*3/uL (ref 0.0–0.5)
Eosinophils Relative: 1 %
HCT: 34.1 % — ABNORMAL LOW (ref 36.0–46.0)
Hemoglobin: 11.4 g/dL — ABNORMAL LOW (ref 12.0–15.0)
Immature Granulocytes: 0 %
Lymphocytes Relative: 17 %
Lymphs Abs: 0.8 10*3/uL (ref 0.7–4.0)
MCH: 25.1 pg — ABNORMAL LOW (ref 26.0–34.0)
MCHC: 33.4 g/dL (ref 30.0–36.0)
MCV: 74.9 fL — ABNORMAL LOW (ref 80.0–100.0)
Monocytes Absolute: 0.3 10*3/uL (ref 0.1–1.0)
Monocytes Relative: 6 %
Neutro Abs: 3.8 10*3/uL (ref 1.7–7.7)
Neutrophils Relative %: 76 %
Platelets: 295 10*3/uL (ref 150–400)
RBC: 4.55 MIL/uL (ref 3.87–5.11)
RDW: 13.3 % (ref 11.5–15.5)
WBC: 5 10*3/uL (ref 4.0–10.5)
nRBC: 0 % (ref 0.0–0.2)

## 2019-11-16 LAB — TROPONIN I (HIGH SENSITIVITY)
Troponin I (High Sensitivity): 6 ng/L (ref <18)
Troponin I (High Sensitivity): <2 ng/L (ref <18)

## 2019-11-16 LAB — COMPREHENSIVE METABOLIC PANEL
ALT: 16 U/L (ref 0–44)
AST: 15 U/L (ref 15–41)
Albumin: 3.5 g/dL (ref 3.5–5.0)
Alkaline Phosphatase: 99 U/L (ref 38–126)
Anion gap: 9 (ref 5–15)
BUN: 11 mg/dL (ref 6–20)
CO2: 25 mmol/L (ref 22–32)
Calcium: 9 mg/dL (ref 8.9–10.3)
Chloride: 105 mmol/L (ref 98–111)
Creatinine, Ser: 0.8 mg/dL (ref 0.44–1.00)
GFR calc Af Amer: >60 mL/min (ref >60)
GFR calc non Af Amer: >60 mL/min (ref >60)
Glucose, Bld: 279 mg/dL — ABNORMAL HIGH (ref 70–99)
Potassium: 4.1 mmol/L (ref 3.5–5.1)
Sodium: 139 mmol/L (ref 135–145)
Total Bilirubin: 0.1 mg/dL — ABNORMAL LOW (ref 0.3–1.2)
Total Protein: 7 g/dL (ref 6.5–8.1)

## 2019-11-16 LAB — RESPIRATORY PANEL BY RT PCR (FLU A&B, COVID)
Influenza A by PCR: NEGATIVE
Influenza B by PCR: NEGATIVE
SARS Coronavirus 2 by RT PCR: NEGATIVE

## 2019-11-16 LAB — HEMOGLOBIN A1C
Hgb A1c MFr Bld: 8.6 % — ABNORMAL HIGH (ref 4.8–5.6)
Mean Plasma Glucose: 200.12 mg/dL

## 2019-11-16 LAB — HIV ANTIBODY (ROUTINE TESTING W REFLEX): HIV Screen 4th Generation wRfx: NONREACTIVE

## 2019-11-16 LAB — CBG MONITORING, ED: Glucose-Capillary: 183 mg/dL — ABNORMAL HIGH (ref 70–99)

## 2019-11-16 LAB — GLUCOSE, CAPILLARY
Glucose-Capillary: 204 mg/dL — ABNORMAL HIGH (ref 70–99)
Glucose-Capillary: 152 mg/dL — ABNORMAL HIGH (ref 70–99)

## 2019-11-16 LAB — SARS CORONAVIRUS 2 (TAT 6-24 HRS): SARS Coronavirus 2: NEGATIVE

## 2019-11-16 LAB — ECHOCARDIOGRAM COMPLETE
Height: 62 in
Weight: 4145.6 oz

## 2019-11-16 LAB — HEPARIN LEVEL (UNFRACTIONATED): Heparin Unfractionated: 0.38 IU/mL (ref 0.30–0.70)

## 2019-11-16 MED ORDER — SODIUM CHLORIDE 0.9% FLUSH: 3.0000 mL | Freq: Two times a day (BID) | INTRAVENOUS | Status: DC

## 2019-11-16 MED ORDER — ASPIRIN 300 MG RE SUPP: 300.0000 mg | RECTAL | Status: DC

## 2019-11-16 MED ORDER — INSULIN ASPART 100 UNIT/ML ~~LOC~~ SOLN
4.0000 [IU] | Freq: Three times a day (TID) | SUBCUTANEOUS | Status: DC
  Administered 2019-11-16 (×2): 4 [IU] via SUBCUTANEOUS

## 2019-11-16 MED ORDER — NITROGLYCERIN IN D5W 200-5 MCG/ML-% IV SOLN
0.0000 ug/min | INTRAVENOUS | Status: DC
  Administered 2019-11-16: 5 ug/min via INTRAVENOUS
  Filled 2019-11-16: qty 250

## 2019-11-16 MED ORDER — ASPIRIN 81 MG PO CHEW
81.0000 mg | CHEWABLE_TABLET | ORAL | Status: AC
  Administered 2019-11-17: 05:00:00 81 mg via ORAL
  Filled 2019-11-16: qty 1

## 2019-11-16 MED ORDER — ASPIRIN 81 MG PO CHEW
324.0000 mg | CHEWABLE_TABLET | Freq: Once | ORAL | Status: AC
  Administered 2019-11-16: 08:00:00 324 mg via ORAL
  Filled 2019-11-16: qty 4

## 2019-11-16 MED ORDER — INSULIN ASPART 100 UNIT/ML ~~LOC~~ SOLN
0.0000 [IU] | Freq: Three times a day (TID) | SUBCUTANEOUS | Status: DC
  Administered 2019-11-16: 3 [IU] via SUBCUTANEOUS
  Administered 2019-11-16: 5 [IU] via SUBCUTANEOUS

## 2019-11-16 MED ORDER — LIDOCAINE VISCOUS HCL 2 % MT SOLN: 15.0000 mL | Freq: Once | OROMUCOSAL | Status: DC

## 2019-11-16 MED ORDER — CARVEDILOL 25 MG PO TABS
25.0000 mg | ORAL_TABLET | Freq: Two times a day (BID) | ORAL | Status: DC
  Administered 2019-11-16: 25 mg via ORAL
  Filled 2019-11-16: qty 1

## 2019-11-16 MED ORDER — ACETAMINOPHEN 325 MG PO TABS
650.0000 mg | ORAL_TABLET | ORAL | Status: DC | PRN
  Administered 2019-11-16: 650 mg via ORAL
  Filled 2019-11-16: qty 2

## 2019-11-16 MED ORDER — NITROGLYCERIN 0.4 MG SL SUBL: 0.4000 mg | SUBLINGUAL_TABLET | SUBLINGUAL | Status: DC | PRN

## 2019-11-16 MED ORDER — ASPIRIN 81 MG PO TABS: 81.0000 mg | ORAL_TABLET | ORAL | Status: DC

## 2019-11-16 MED ORDER — HEPARIN (PORCINE) 25000 UT/250ML-% IV SOLN
1000.0000 [IU]/h | INTRAVENOUS | Status: DC
  Administered 2019-11-16: 1000 [IU]/h via INTRAVENOUS
  Filled 2019-11-16 (×2): qty 250

## 2019-11-16 MED ORDER — SODIUM CHLORIDE 0.9% FLUSH: 3.0000 mL | INTRAVENOUS | Status: DC | PRN

## 2019-11-16 MED ORDER — NITROGLYCERIN 0.4 MG SL SUBL
0.4000 mg | SUBLINGUAL_TABLET | Freq: Once | SUBLINGUAL | Status: AC
  Administered 2019-11-16: 0.4 mg via SUBLINGUAL
  Filled 2019-11-16: qty 1

## 2019-11-16 MED ORDER — LOSARTAN POTASSIUM 50 MG PO TABS: 100.0000 mg | ORAL_TABLET | Freq: Every day | ORAL | Status: DC

## 2019-11-16 MED ORDER — ATORVASTATIN CALCIUM 40 MG PO TABS
40.0000 mg | ORAL_TABLET | Freq: Every day | ORAL | Status: DC
  Administered 2019-11-16: 40 mg via ORAL
  Filled 2019-11-16: qty 1

## 2019-11-16 MED ORDER — AMLODIPINE BESYLATE 5 MG PO TABS
5.0000 mg | ORAL_TABLET | Freq: Every evening | ORAL | Status: DC
  Filled 2019-11-16: qty 1

## 2019-11-16 MED ORDER — INFLUENZA VAC SPLIT QUAD 0.5 ML IM SUSY: 0.5000 mL | PREFILLED_SYRINGE | INTRAMUSCULAR | Status: DC

## 2019-11-16 MED ORDER — ONDANSETRON HCL 4 MG/2ML IJ SOLN: 4.0000 mg | Freq: Four times a day (QID) | INTRAMUSCULAR | Status: DC | PRN

## 2019-11-16 MED ORDER — HEPARIN BOLUS VIA INFUSION
4000.0000 [IU] | Freq: Once | INTRAVENOUS | Status: AC
  Administered 2019-11-16: 4000 [IU] via INTRAVENOUS
  Filled 2019-11-16: qty 4000

## 2019-11-16 MED ORDER — SODIUM CHLORIDE 0.9 % IV SOLN: 250.0000 mL | INTRAVENOUS | Status: DC | PRN

## 2019-11-16 MED ORDER — ASPIRIN 81 MG PO CHEW: 324.0000 mg | CHEWABLE_TABLET | ORAL | Status: DC

## 2019-11-16 MED ORDER — ALUM & MAG HYDROXIDE-SIMETH 200-200-20 MG/5ML PO SUSP: 30.0000 mL | Freq: Once | ORAL | Status: DC

## 2019-11-16 MED ORDER — TICAGRELOR 90 MG PO TABS
90.0000 mg | ORAL_TABLET | Freq: Two times a day (BID) | ORAL | Status: DC
  Administered 2019-11-16 – 2019-11-17 (×3): 90 mg via ORAL
  Filled 2019-11-16 (×3): qty 1

## 2019-11-16 MED ORDER — FUROSEMIDE 40 MG PO TABS
40.0000 mg | ORAL_TABLET | Freq: Every day | ORAL | Status: DC
  Administered 2019-11-16: 40 mg via ORAL
  Filled 2019-11-16: qty 1

## 2019-11-16 MED ORDER — SODIUM CHLORIDE 0.9 % WEIGHT BASED INFUSION
1.0000 mL/kg/h | INTRAVENOUS | Status: DC
  Administered 2019-11-17: 1 mL/kg/h via INTRAVENOUS

## 2019-11-16 MED ORDER — SODIUM CHLORIDE 0.9 % WEIGHT BASED INFUSION
3.0000 mL/kg/h | INTRAVENOUS | Status: AC
  Administered 2019-11-17: 3 mL/kg/h via INTRAVENOUS

## 2019-11-16 MED ORDER — ASPIRIN EC 81 MG PO TBEC: 81.0000 mg | DELAYED_RELEASE_TABLET | Freq: Every day | ORAL | Status: DC

## 2019-11-16 NOTE — H&P (Signed)
[name] is an 54 y.o. female.   Chief Complaint: Chest pain HPI:   54 year old African-American female with hypertension, type 2 diabetes mellitus, coronary artery disease status post mid RCA PCI 2013 and 2019, residual Mid LCx, OM1, OM2, Diag 2, prox-mid RCA, Rt PDA moderate to severe disease medically treated.  Patient was lost to follow-up since then, as she was in [place] taking care of her ailing mother.  She did not have any chest pain since her PCI in January 2019, until yesterday.  Since yesterday, she has experienced episodes of chest heaviness at rest, with radiation to left arm.  Presentation is similar to her presentation in January 2019.  She also has shortness of breath with cough with clear phlegm.  She denies any fever, chills, or any other infectious symptoms.  She did have improvement in her chest pressure with oral and IV nitroglycerin, but continues to have retrosternal burning sensation.   Past Medical History:  Diagnosis Date  . Anemia   . CHF (congestive heart failure) ([place]) 04/2011   EF 15-20% from echo 05/19/11  . DOE (dyspnea on exertion)   . DVT (deep venous thrombosis) ([place]) 06/2011   LLE  . Fatigue   . HTN (hypertension)   . Menorrhagia    with iron deficient anemia  . NSTEMI (non-ST elevated myocardial infarction) ([place]) 10/26/2017  . NSTEMI (non-ST elevated myocardial infarction) ([place]) 10/27/2017  . Obesity   . Orthopnea   . Type II diabetes mellitus ([place])     Past Surgical History:  Procedure Laterality Date  . CORONARY ANGIOPLASTY WITH STENT PLACEMENT  2013;  10/27/2017  . CORONARY STENT INTERVENTION N/A 10/27/2017   Procedure: CORONARY STENT INTERVENTION;  Surgeon: [name], MD;  Location: [facility];  Service: Cardiovascular;  Laterality: N/A;  . LEFT HEART CATH AND CORONARY ANGIOGRAPHY N/A 10/27/2017   Procedure: LEFT HEART CATH AND CORONARY ANGIOGRAPHY;  Surgeon: [name], MD;  Location: [facility];   Service: Cardiovascular;  Laterality: N/A;  . LEFT HEART CATHETERIZATION WITH CORONARY ANGIOGRAM N/A 11/17/2011   Procedure: LEFT HEART CATHETERIZATION WITH CORONARY ANGIOGRAM;  Surgeon: Laverda Page, MD;  Location: [name];  Service: Cardiovascular;  Laterality: N/A;  . OVARIAN CYST REMOVAL    . TUBAL LIGATION  2006  . ULTRASOUND GUIDANCE FOR VASCULAR ACCESS  10/27/2017   Procedure: Ultrasound Guidance For Vascular Access;  Surgeon: [name], MD;  Location: [facility];  Service: Cardiovascular;;    Family History  Problem Relation Age of Onset  . Hypertension Mother   . Stroke Father    Social History:  reports that she quit smoking about 26 years ago. Her smoking use included cigarettes. She has a 2.50 pack-year smoking history. She has never used smokeless tobacco. She reports that she does not drink alcohol or use drugs.  Allergies:  Allergies  Allergen Reactions  . Detrol [Tolterodine Tartrate]     rash    Review of Systems  Cardiovascular: Positive for chest pain. Negative for dyspnea on exertion, leg swelling, palpitations and syncope.  Respiratory: Positive for shortness of breath.      Blood pressure 113/72, pulse 70, temperature 97.9 F (36.6 C), temperature source Oral, resp. rate 16, height 5\' 2"  (1.575 m), weight 113.4 kg, SpO2 100 %. Body mass index is 45.73 kg/m.  Physical Exam  Constitutional: She appears well-developed and well-nourished.  Neck: No JVD present.  Cardiovascular: Normal rate, regular rhythm, normal  heart sounds and intact distal pulses.  No murmur heard. Pulmonary/Chest: Effort normal and breath sounds normal. She has no wheezes. She has no rales.  Musculoskeletal:        General: No edema.  Nursing note and vitals reviewed.   Results for orders placed or performed during the hospital encounter of 11/16/19 (from the past 48 hour(s))  Comprehensive metabolic panel     Status: Abnormal   Collection Time: 11/16/19   8:17 AM  Result Value Ref Range   Sodium 139 135 - 145 mmol/L   Potassium 4.1 3.5 - 5.1 mmol/L   Chloride 105 98 - 111 mmol/L   CO2 25 22 - 32 mmol/L   Glucose, Bld 279 (H) 70 - 99 mg/dL   BUN 11 6 - 20 mg/dL   Creatinine, Ser 0.80 0.44 - 1.00 mg/dL   Calcium 9.0 8.9 - 10.3 mg/dL   Total Protein 7.0 6.5 - 8.1 g/dL   Albumin 3.5 3.5 - 5.0 g/dL   AST 15 15 - 41 U/L   ALT 16 0 - 44 U/L   Alkaline Phosphatase 99 38 - 126 U/L   Total Bilirubin 0.1 (L) 0.3 - 1.2 mg/dL   GFR calc non Af Amer >60 >60 mL/min   GFR calc Af Amer >60 >60 mL/min   Anion gap 9 5 - 15    Comment: Performed at [facility], [address] [place], [place] [address]  Troponin I (High Sensitivity)     Status: None   Collection Time: 11/16/19  8:17 AM  Result Value Ref Range   Troponin I (High Sensitivity) 6 <18 ng/L    Comment: (NOTE) Elevated high sensitivity troponin I (hsTnI) values and significant  changes across serial measurements may suggest ACS but many other  chronic and acute conditions are known to elevate hsTnI results.  Refer to the Links section for chest pain algorithms and additional  guidance. Performed at [facility], [place] [address] [place], [place] [address]   CBC with Differential     Status: Abnormal   Collection Time: 11/16/19  8:17 AM  Result Value Ref Range   WBC 5.0 4.0 - 10.5 K/uL   RBC 4.55 3.87 - 5.11 MIL/uL   Hemoglobin 11.4 (L) 12.0 - 15.0 g/dL   HCT 34.1 (L) 36.0 - 46.0 %   MCV 74.9 (L) 80.0 - 100.0 fL   MCH 25.1 (L) 26.0 - 34.0 pg   MCHC 33.4 30.0 - 36.0 g/dL   RDW 13.3 11.5 - 15.5 %   Platelets 295 150 - 400 K/uL   nRBC 0.0 0.0 - 0.2 %   Neutrophils Relative % 76 %   Neutro Abs 3.8 1.7 - 7.7 K/uL   Lymphocytes Relative 17 %   Lymphs Abs 0.8 0.7 - 4.0 K/uL   Monocytes Relative 6 %   Monocytes Absolute 0.3 0.1 - 1.0 K/uL   Eosinophils Relative 1 %   Eosinophils Absolute 0.1 0.0 - 0.5 K/uL   Basophils Relative 0 %   Basophils Absolute 0.0 0.0 - 0.1  K/uL   Immature Granulocytes 0 %   Abs Immature Granulocytes 0.02 0.00 - 0.07 K/uL    Comment: Performed at [facility] [address], [place], [place] [address]  Respiratory Panel by RT PCR (Flu A&B, Covid) - Nasopharyngeal Swab     Status: None   Collection Time: 11/16/19  8:21 AM   Specimen: Nasopharyngeal Swab  Result Value Ref Range  SARS Coronavirus 2 by RT PCR NEGATIVE NEGATIVE    Comment: (NOTE) SARS-CoV-2 target nucleic acids are NOT DETECTED. The SARS-CoV-2 RNA is generally detectable in upper respiratoy specimens during the acute phase of infection. The lowest concentration of SARS-CoV-2 viral copies this assay can detect is 131 copies/mL. A negative result does not preclude SARS-Cov-2 infection and should not be used as the sole basis for treatment or other patient management decisions. A negative result may occur with  improper specimen collection/handling, submission of specimen other than nasopharyngeal swab, presence of viral mutation(s) within the areas targeted by this assay, and inadequate number of viral copies (<131 copies/mL). A negative result must be combined with clinical observations, patient history, and epidemiological information. The expected result is Negative. Fact Sheet for Patients:  PinkCheek.be Fact Sheet for Healthcare Providers:  GravelBags.it This test is not yet ap proved or cleared by the [place] FDA and  has been authorized for detection and/or diagnosis of SARS-CoV-2 by FDA under an Emergency Use Authorization (EUA). This EUA will remain  in effect (meaning this test can be used) for the duration of the COVID-19 declaration under Section 564(b)(1) of the Act, 21 U.S.C. section 360bbb-3(b)(1), unless the authorization is terminated or revoked sooner.    Influenza A by PCR NEGATIVE NEGATIVE   Influenza B by PCR NEGATIVE NEGATIVE    Comment: (NOTE) The Xpert Xpress  SARS-CoV-2/FLU/RSV assay is intended as an aid in  the diagnosis of influenza from Nasopharyngeal swab specimens and  should not be used as a sole basis for treatment. Nasal washings and  aspirates are unacceptable for Xpert Xpress SARS-CoV-2/FLU/RSV  testing. Fact Sheet for Patients: PinkCheek.be Fact Sheet for Healthcare Providers: GravelBags.it This test is not yet approved or cleared by the [place] FDA and  has been authorized for detection and/or diagnosis of SARS-CoV-2 by  FDA under an Emergency Use Authorization (EUA). This EUA will remain  in effect (meaning this test can be used) for the duration of the  Covid-19 declaration under Section 564(b)(1) of the Act, 21  U.S.C. section 360bbb-3(b)(1), unless the authorization is  terminated or revoked. Performed at [facility], [place] [address] [place], [place] [address]   Troponin I (High Sensitivity)     Status: None   Collection Time: 11/16/19 10:35 AM  Result Value Ref Range   Troponin I (High Sensitivity) <2 <18 ng/L    Comment: (NOTE) Elevated high sensitivity troponin I (hsTnI) values and significant  changes across serial measurements may suggest ACS but many other  chronic and acute conditions are known to elevate hsTnI results.  Refer to the Links section for chest pain algorithms and additional  guidance. Performed at [facility], [place] [address], [place], [place] [address]   CBG monitoring, ED     Status: Abnormal   Collection Time: 11/16/19 12:26 PM  Result Value Ref Range   Glucose-Capillary 183 (H) 70 - 99 mg/dL    Labs:   Lab Results  Component Value Date   WBC 5.0 11/16/2019   HGB 11.4 (L) 11/16/2019   HCT 34.1 (L) 11/16/2019   MCV 74.9 (L) 11/16/2019   PLT 295 11/16/2019    Recent Labs  Lab 11/16/19 0817  NA 139  K 4.1  CL 105  CO2 25  BUN 11  CREATININE 0.80  CALCIUM 9.0  PROT 7.0  BILITOT 0.1*  ALKPHOS 99  ALT  16  AST 15  GLUCOSE 279*    Lipid Panel  Component Value Date/Time   CHOL 118 10/28/2017 1402   TRIG 111 10/28/2017 1402   HDL 31 (L) 10/28/2017 1402   CHOLHDL 3.8 10/28/2017 1402   VLDL 22 10/28/2017 1402   LDLCALC 65 10/28/2017 1402    BNP (last 3 results) No results for input(s): BNP in the last 8760 hours.  HEMOGLOBIN A1C Lab Results  Component Value Date   HGBA1C 10.2 (H) 10/27/2017   MPG 246.04 10/27/2017    Cardiac Panel (last 3 results) No results for input(s): CKTOTAL, CKMB, RELINDX in the last 8760 hours.  Invalid input(s): TROPONINHS  Lab Results  Component Value Date   CKTOTAL 105 11/01/2011   CKMB 1.6 11/01/2011     TSH No results for input(s): TSH in the last 8760 hours.   (Not in a hospital admission)     Current Facility-Administered Medications:  .  acetaminophen (TYLENOL) tablet 650 mg, 650 mg, Oral, Q4H PRN, Merlyn Bollen J, MD .  amLODipine (NORVASC) tablet 5 mg, 5 mg, Oral, QPM, Alajah Witman J, MD .  Derrill Memo ON 11/17/2019] aspirin EC tablet 81 mg, 81 mg, Oral, Daily, Valoree Agent J, MD .  atorvastatin (LIPITOR) tablet 40 mg, 40 mg, Oral, Daily, Cassundra Mckeever J, MD .  carvedilol (COREG) tablet 25 mg, 25 mg, Oral, BID, Zakir Henner J, MD .  furosemide (LASIX) tablet 40 mg, 40 mg, Oral, Daily, Jecenia Leamer J, MD .  heparin ADULT infusion 100 units/mL (25000 units/263mL sodium chloride 0.45%), 1,000 Units/hr, Intravenous, Continuous, Rumbarger, Valeda Malm, RPH, Last Rate: 10 mL/hr at 11/16/19 1030, 1,000 Units/hr at 11/16/19 1030 .  insulin aspart (novoLOG) injection 0-15 Units, 0-15 Units, Subcutaneous, TID WC, Branch Pacitti J, MD .  insulin aspart (novoLOG) injection 4 Units, 4 Units, Subcutaneous, TID WC, Jacquelyne Quarry J, MD .  Derrill Memo ON 11/17/2019] losartan (COZAAR) tablet 100 mg, 100 mg, Oral, Daily, Courtnie Brenes J, MD .  nitroGLYCERIN (NITROSTAT) SL tablet 0.4 mg, 0.4 mg, Sublingual, Q5 Min  x 3 PRN, Lonetta Blassingame J, MD .  nitroGLYCERIN 50 mg in dextrose 5 % 250 mL (0.2 mg/mL) infusion, 0-200 mcg/min, Intravenous, Continuous, Davonna Belling, MD, Last Rate: 9 mL/hr at 11/16/19 1126, 30 mcg/min at 11/16/19 1126 .  ondansetron (ZOFRAN) injection 4 mg, 4 mg, Intravenous, Q6H PRN, Nirali Magouirk J, MD .  sodium chloride flush (NS) 0.9 % injection 3 mL, 3 mL, Intravenous, Q12H, Inola Lisle J, MD .  ticagrelor (BRILINTA) tablet 90 mg, 90 mg, Oral, BID, Juan Olthoff J, MD  Current Outpatient Medications:  .  amLODipine (NORVASC) 5 MG tablet, Take 5 mg by mouth every evening. , Disp: , Rfl:  .  aspirin 81 MG tablet, Take 81 mg by mouth every morning. , Disp: , Rfl:  .  atorvastatin (LIPITOR) 40 MG tablet, Take 40 mg by mouth daily., Disp: , Rfl: 1 .  BRILINTA 90 MG TABS tablet, TAKE 1 TABLET (90 MG TOTAL) BY MOUTH 2 (TWO) TIMES DAILY. (Patient taking differently: Take 90 mg by mouth 2 (two) times daily. ), Disp: 180 tablet, Rfl: 0 .  carvedilol (COREG) 25 MG tablet, Take 1 tablet (25 mg total) by mouth 2 (two) times daily., Disp: 60 tablet, Rfl: 11 .  Cholecalciferol (VITAMIN D-3) 1000 UNITS CAPS, Take 1 capsule by mouth daily. , Disp: , Rfl:  .  Ferrous Sulfate (IRON) 325 (65 FE) MG TABS, Take 1 tablet by mouth 2 (two) times daily.  , Disp: , Rfl:  .  furosemide (LASIX) 40 MG  tablet, Take 40 mg by mouth daily. , Disp: , Rfl:  .  losartan (COZAAR) 100 MG tablet, Take 100 mg by mouth daily., Disp: , Rfl:  .  metFORMIN (GLUCOPHAGE) 1000 MG tablet, Take 1,000 mg by mouth 2 (two) times daily with a meal. , Disp: , Rfl:  .  nitroGLYCERIN (NITROSTAT) 0.4 MG SL tablet, Place 1 tablet (0.4 mg total) under the tongue every 5 (five) minutes x 3 doses as needed for chest pain., Disp: 30 tablet, Rfl: 1 .  VICTOZA 18 MG/3ML SOPN, Inject 0.6 mg into the skin daily. Needs prior Auth.from MD., Disp: , Rfl:  .  ondansetron (ZOFRAN ODT) 4 MG disintegrating tablet, Take 1 tablet (4 mg  total) by mouth every 8 (eight) hours as needed for nausea or vomiting. (Patient not taking: Reported on 11/16/2019), Disp: 10 tablet, Rfl: 0   Today's Vitals   11/16/19 1045 11/16/19 1056 11/16/19 1100 11/16/19 1115  BP: 125/74  118/67 113/72  Pulse: 66  (!) 59 70  Resp: 16  15 16   Temp:      TempSrc:      SpO2: 100% 100% 100% 100%  Weight:      Height:      PainSc:       Body mass index is 45.73 kg/m.  Results for BETHEL, KINOSHITA (MRN [id number]) as of 11/16/2019 14:24  Ref. Range 11/16/2019 08:17 11/16/2019 10:35  Troponin I (High Sensitivity) Latest Ref Range: <18 ng/L 6 <2    CARDIAC STUDIES:  EKG 11/16/2019: Sinus rhythm 70 bpm.  Normal EKG.  Coronary angiogram/intervention 10/2017: Severe mid RCA 95% stenosis (Culprit stenosis) Successful PTCA and stent placement with 0% residual stenosis, TIMI flow II-III Residual Mid LCx, OM1, OM2, Diag 2, prox-mid RCA, Rt PDA moderate to severe disease that is unchanged compare to prior cath in 2013.   Recommendation: Aspirin 81 mg lifelong, brilinta 90 mg bid for at least 1 year. Aggressive risk factor modification and medical management. Noninvasive ischemia assessment to assess residual ischemia and guide future invasive management. This can be performed outpatient.   Echocardiogram 10/2017: - Left ventricle: The cavity size was normal. Wall thickness was   normal. Systolic function was normal. The estimated ejection   fraction was in the range of 55% to 60%. Left ventricular   diastolic function parameters were normal. - Mild aortic sclerosis. No significant valvular abnormalities. - No evidence of pulmonary hypertension.    Assessment/Plan  54 year old African-American female with hypertension, type 2 diabetes mellitus, coronary artery disease status post mid RCA PCI 2013 and 2019, residual Mid LCx, OM1, OM2, Diag 2, prox-mid RCA, Rt PDA moderate to severe disease, now admitted with chest pain:  Chest pain: Unremarkable  EKG and trop, but nitrate responsive chest pain in patient with known CAD concerning for unstable angina. Continue IV nitroglycerin and IV heparin.  Continue aspirin, Brilinta. Plan to perform coronary angiogram on 11/17/2019.  We will check echocardiogram. Her chest pressure has improved.  Suspect her retrosternal burning sensation may be unrelated to angina.  Will try GI treatment.  CAD: Continue baseline antiplatelet and antianginal therapy. Continue Lipitor 40 mg.    Hypertension: Well-controlled.  Hold losartan prior to cath tomorrow morning.  Type 2 DM: Hold metformin. SSI. Check A1C  [name], MD 11/16/2019, 12:27 PM [facility] Pager: [phone] Office: [phone] If no answer: [phone]

## 2019-11-16 NOTE — Progress Notes (Signed)
  Echocardiogram 2D Echocardiogram has been performed.  Nole Robey A Latonya Nelon 11/16/2019, 3:19 PM

## 2019-11-16 NOTE — Progress Notes (Signed)
ANTICOAGULATION CONSULT NOTE - [place] for heparin Indication: chest pain/ACS  Allergies  Allergen Reactions  . Detrol [Tolterodine Tartrate]     rash    Patient Measurements: Height: 5\' 2"  (157.5 cm) Weight: 259 lb 1.6 oz (117.5 kg) IBW/kg (Calculated) : 50.1 Heparin Dosing Weight: 77.9kg  Vital Signs: Temp: 97.9 F (36.6 C) (01/28 1316) Temp Source: Oral (01/28 1316) BP: 102/57 (01/28 1755) Pulse Rate: 75 (01/28 1755)  Labs: Recent Labs    11/16/19 0817 11/16/19 1035 11/16/19 1803  HGB 11.4*  --   --   HCT 34.1*  --   --   PLT 295  --   --   HEPARINUNFRC  --   --  0.38  CREATININE 0.80  --   --   TROPONINIHS 6 <2  --     Estimated Creatinine Clearance: 101.3 mL/min (by C-G formula based on SCr of 0.8 mg/dL).   Medications:  Infusions:  . sodium chloride    . [START ON 11/17/2019] sodium chloride     Followed by  . [START ON 11/17/2019] sodium chloride    . heparin 1,000 Units/hr (11/16/19 1030)  . nitroGLYCERIN 20 mcg/min (11/16/19 1857)    Assessment: 44 YOF presented to the ED on 1/28 with chest pain. Pharmacy consulted to start Heparin for anticoagulation.  The patient's heparin level this evening resulted as therapeutic (HL 0.38, goal of 0.3-0.7). No bleeding or issues noted per discussion with the RN.   Goal of Therapy:  Heparin level 0.3-0.7 units/ml Monitor platelets by anticoagulation protocol: Yes   Plan:  - Continue Heparin at 1000 units/hr (10 ml/hr) - Will continue to monitor for any signs/symptoms of bleeding and will follow up with heparin level in the AM to confirm therapeutic  Thank you for allowing pharmacy to be a part of this patient's care.  Alycia Rossetti, PharmD, BCPS Clinical Pharmacist Clinical phone for 11/16/2019: [phone] 11/16/2019 8:01 PM   **Pharmacist phone directory can now be found on [email or web address] ([email or web address]).  Listed under [place].

## 2019-11-16 NOTE — ED Triage Notes (Signed)
Pt here from home with c/o chest pain left sided radiating to the left arm , pt with some sob , no n/v . Pt does have 1 stent

## 2019-11-16 NOTE — ED Provider Notes (Signed)
[facility] EMERGENCY DEPARTMENT Provider Note   CSN: [id number] Arrival date & time: 11/16/19  [id number]     History No chief complaint on file.   [name] is a 54 y.o. female.  HPI Patient presents with chest pain.  Began yesterday.  Has been having it at work.  States that it is dull in her left upper chest and goes up to left jaw.  States it feels like previous heart issues.  Had a previous stent 2 years ago.  Has not really had any chest pain since then however.  States it came on after some exertion at work.  It had resolved but started again at around 2:00 last night.  Has been constant since then.  States she was unable to work today due to the pain.  No fevers.  Has had an occasional cough with some mild sputum.  No nausea or vomiting.  No swelling in her legs.  States she really has not follow-up with her cardiologist since 2 years ago.    Past Medical History:  Diagnosis Date  . Anemia   . CHF (congestive heart failure) ([place]) 04/2011   EF 15-20% from echo 05/19/11  . DOE (dyspnea on exertion)   . DVT (deep venous thrombosis) ([place]) 06/2011   LLE  . Fatigue   . HTN (hypertension)   . Menorrhagia    with iron deficient anemia  . NSTEMI (non-ST elevated myocardial infarction) ([place]) 10/26/2017  . NSTEMI (non-ST elevated myocardial infarction) ([place]) 10/27/2017  . Obesity   . Orthopnea   . Type II diabetes mellitus [facility])     Patient Active Problem List   Diagnosis Date Noted  . Unstable angina ([place]) 11/16/2019  . NSTEMI (non-ST elevated myocardial infarction) ([place]) 10/27/2017  . OSA (obstructive sleep apnea) 02/21/2013  . CAD (coronary artery disease), native coronary artery 11/18/2011  . Hyperlipidemia 11/18/2011  . Chest pain syndrome 11/17/2011  . Abnormal cardiovascular stress test 11/17/2011  . DVT (deep venous thrombosis) ([place]) 11/01/2011  . Hypertension 11/01/2011  . Anemia 11/01/2011    Past Surgical History:  Procedure Laterality Date    . CORONARY ANGIOPLASTY WITH STENT PLACEMENT  2013;  10/27/2017  . CORONARY STENT INTERVENTION N/A 10/27/2017   Procedure: CORONARY STENT INTERVENTION;  Surgeon: [name], MD;  Location: [place] CV LAB;  Service: Cardiovascular;  Laterality: N/A;  . LEFT HEART CATH AND CORONARY ANGIOGRAPHY N/A 10/27/2017   Procedure: LEFT HEART CATH AND CORONARY ANGIOGRAPHY;  Surgeon: [name], MD;  Location: [place] CV LAB;  Service: Cardiovascular;  Laterality: N/A;  . LEFT HEART CATHETERIZATION WITH CORONARY ANGIOGRAM N/A 11/17/2011   Procedure: LEFT HEART CATHETERIZATION WITH CORONARY ANGIOGRAM;  Surgeon: Laverda Page, MD;  Location: [facility] CATH LAB;  Service: Cardiovascular;  Laterality: N/A;  . OVARIAN CYST REMOVAL    . TUBAL LIGATION  2006  . ULTRASOUND GUIDANCE FOR VASCULAR ACCESS  10/27/2017   Procedure: Ultrasound Guidance For Vascular Access;  Surgeon: [name], MD;  Location: [place] CV LAB;  Service: Cardiovascular;;     OB History   No obstetric history on file.     Family History  Problem Relation Age of Onset  . Hypertension Mother   . Stroke Father     Social History   Tobacco Use  . Smoking status: Former Smoker    Packs/day: 0.50    Years: 5.00    Pack years: 2.50    Types: Cigarettes  Quit date: 10/19/1993    Years since quitting: 26.0  . Smokeless tobacco: Never Used  Substance Use Topics  . Alcohol use: No  . Drug use: No    Home Medications Prior to Admission medications   Medication Sig Start Date End Date Taking? Authorizing Provider  amLODipine (NORVASC) 5 MG tablet Take 5 mg by mouth every evening.     [provider]  aspirin 81 MG tablet Take 81 mg by mouth every morning.     [provider]  atorvastatin (LIPITOR) 40 MG tablet Take 40 mg by mouth daily. 12/06/16   [provider]  BRILINTA 90 MG TABS tablet TAKE 1 TABLET (90 MG TOTAL) BY MOUTH 2 (TWO) TIMES DAILY. 04/24/19   Patwardhan,  Reynold Bowen, MD  carvedilol (COREG) 25 MG tablet Take 1 tablet (25 mg total) by mouth 2 (two) times daily. 11/03/17 11/03/18  Patwardhan, Reynold Bowen, MD  Cholecalciferol (VITAMIN D-3) 1000 UNITS CAPS Take 1 capsule by mouth daily.     [provider]  Ferrous Sulfate (IRON) 325 (65 FE) MG TABS Take 1 tablet by mouth 2 (two) times daily.      [provider]  furosemide (LASIX) 40 MG tablet Take 40 mg by mouth daily.     [provider]  losartan (COZAAR) 50 MG tablet Take 1 tablet (50 mg total) by mouth daily. 11/03/17 11/03/18  Patwardhan, Reynold Bowen, MD  metFORMIN (GLUCOPHAGE) 500 MG tablet Take 1,000 mg by mouth 2 (two) times daily with a meal.     [provider]  nitroGLYCERIN (NITROSTAT) 0.4 MG SL tablet Place 1 tablet (0.4 mg total) under the tongue every 5 (five) minutes x 3 doses as needed for chest pain. 10/28/17   Patwardhan, Reynold Bowen, MD  ondansetron (ZOFRAN ODT) 4 MG disintegrating tablet Take 1 tablet (4 mg total) by mouth every 8 (eight) hours as needed for nausea or vomiting. 02/07/17   Nona Dell, PA-C    Allergies    Detrol [tolterodine tartrate]  Review of Systems   Review of Systems  Constitutional: Negative for fever.  HENT: Negative for congestion.   Respiratory: Positive for cough and shortness of breath.   Cardiovascular: Positive for chest pain. Negative for leg swelling.  Gastrointestinal: Negative for abdominal pain.  Genitourinary: Negative for flank pain.  Musculoskeletal: Negative for back pain.  Skin: Negative for rash.  Neurological: Negative for weakness.    Physical Exam Updated Vital Signs BP (!) 141/75   Pulse 64   Temp 97.9 F (36.6 C) (Oral)   Resp 12   Ht 5\' 2"  (1.575 m)   Wt 113.4 kg   SpO2 100% Comment: room air  BMI 45.73 kg/m   Physical Exam Constitutional:      Appearance: She is obese.  HENT:     Head: Normocephalic.  Eyes:     Extraocular Movements: Extraocular movements intact.   Cardiovascular:     Rate and Rhythm: Normal rate and regular rhythm.  Pulmonary:     Comments: Tenderness to left upper chest wall.  No rash.  Lungs are clear. Chest:     Chest wall: Tenderness present.  Abdominal:     Tenderness: There is no abdominal tenderness.  Musculoskeletal:        General: No tenderness.     Cervical back: Neck supple.  Skin:    General: Skin is warm.     Capillary Refill: Capillary refill takes less than 2 seconds.  Neurological:  Mental Status: She is alert. Mental status is at baseline.     ED Results / Procedures / Treatments   Labs (all labs ordered are listed, but only abnormal results are displayed) Labs Reviewed  COMPREHENSIVE METABOLIC PANEL - Abnormal; Notable for the following components:      Result Value   Glucose, Bld 279 (*)    Total Bilirubin 0.1 (*)    All other components within normal limits  CBC WITH DIFFERENTIAL/PLATELET - Abnormal; Notable for the following components:   Hemoglobin 11.4 (*)    HCT 34.1 (*)    MCV 74.9 (*)    MCH 25.1 (*)    All other components within normal limits  RESPIRATORY PANEL BY RT PCR (FLU A&B, COVID)  TROPONIN I (HIGH SENSITIVITY)  TROPONIN I (HIGH SENSITIVITY)    EKG EKG Interpretation  Date/Time:  Thursday November 16 2019 08:09:31 EST Ventricular Rate:  73 PR Interval:    QRS Duration: 92 QT Interval:  359 QTC Calculation: 396 R Axis:   40 Text Interpretation: Sinus rhythm Confirmed by Davonna Belling [phone]) on 11/16/2019 8:10:50 AM   Radiology DG Chest Portable 1 View  Result Date: 11/16/2019 CLINICAL DATA:  LEFT side chest pain radiating to LEFT arm, some shortness of breath, history diabetes mellitus hypertension CHF coronary stenting EXAM: PORTABLE CHEST 1 VIEW COMPARISON:  Portable exam 0829 hours compared to 10/26/2017 FINDINGS: Upper normal heart size. Mediastinal contours and pulmonary vascularity normal. Mild central chronic peribronchial thickening. Lungs clear. No  infiltrate, pleural effusion, or pneumothorax. No acute osseous findings. IMPRESSION: Mild chronic bronchitic changes without acute infiltrate. Electronically Signed   By: Lavonia Dana M.D.   On: 11/16/2019 08:38    Procedures Procedures (including critical care time)  Medications Ordered in ED Medications  nitroGLYCERIN 50 mg in dextrose 5 % 250 mL (0.2 mg/mL) infusion (has no administration in time range)  nitroGLYCERIN (NITROSTAT) SL tablet 0.4 mg (0.4 mg Sublingual Given 11/16/19 0824)  aspirin chewable tablet 324 mg (324 mg Oral Given 11/16/19 [id number])    ED Course  I have reviewed the triage vital signs and the nursing notes.  Pertinent labs & imaging results that were available during my care of the patient were reviewed by me and considered in my medical decision making (see chart for details).    MDM Rules/Calculators/A&P                      Patient Libby Maw with chest pain.  Somewhat worrisome for unstable angina and that it occurred at rest after initially coming with exertion.  Mid chest like previous angina.  However EKG reassuring troponin negative.  Discussed with cardiology.  Will admit.  Will start heparin and nitro drip.  Has had a little bit of a cough and Covid test pending.  Admit.  CRITICAL CARE Performed by: Davonna Belling Total critical care time: 30 minutes Critical care time was exclusive of separately billable procedures and treating other patients. Critical care was necessary to treat or prevent imminent or life-threatening deterioration. Critical care was time spent personally by me on the following activities: development of treatment plan with patient and/or surrogate as well as nursing, discussions with consultants, evaluation of patient's response to treatment, examination of patient, obtaining history from patient or surrogate, ordering and performing treatments and interventions, ordering and review of laboratory studies, ordering and review of radiographic  studies, pulse oximetry and re-evaluation of patient's condition.  Final Clinical Impression(s) / ED Diagnoses Final diagnoses:  Unstable angina [facility])    Rx / DC Orders ED Discharge Orders    None       Davonna Belling, MD 11/16/19 [phone]

## 2019-11-16 NOTE — Progress Notes (Signed)
   11/16/19 2115  Vitals  BP (!) 97/48  MAP (mmHg) (!) 62  Pulse Rate 70  ECG Heart Rate 73  Resp (!) 25  Oxygen Therapy  SpO2 100 %  MEWS Score  MEWS Temp 0  MEWS Systolic 1  MEWS Pulse 0  MEWS RR 1  MEWS LOC 0  MEWS Score 2  MEWS Score Color Yellow   Patient c/o of headache. BP 97/48. Paged provider. Stated to stop nitro and give Prn tylenol. Repeat BP in 1 hour. If below [id number] systolic give [id number] mL bolus.

## 2019-11-16 NOTE — Progress Notes (Signed)
Received report from [place] at [place].  Patient arrived at 82, RN to bedside. A&O x4. No chest pain.   Per MD Manish, called echo at 1435 to confirm patient scheduled for echo today; pt. Is scheduled today however unable to provide estimate of time of echo  Paged [facility] Nightcall NP Mancel Bale at [phone]. BP 102/57 @ 1755; held Norvasc 5mg .Nitro gtt 20mcg/min; ok to titrate down? Upon callback informed not cover Manish pt, paged Manish at [place]  Upon callback at [place] from [facility], titrate to 45mcg/min and hold Norvasc. infomed can start pre cath fluids early if BP goes below 100.   Informed night RN Alisha during rounds of MD ok to start pre cath fluids if SBP below 100

## 2019-11-16 NOTE — Progress Notes (Signed)
ANTICOAGULATION CONSULT NOTE - Initial Consult  Pharmacy Consult for heparin Indication: chest pain/ACS  Allergies  Allergen Reactions  . Detrol [Tolterodine Tartrate]     rash    Patient Measurements: Height: 5\' 2"  (157.5 cm) Weight: 250 lb (113.4 kg) IBW/kg (Calculated) : 50.1 Heparin Dosing Weight: 77.9kg  Vital Signs: Temp: 97.9 F (36.6 C) (01/28 0810) Temp Source: Oral (01/28 0810) BP: 141/75 (01/28 0945) Pulse Rate: 64 (01/28 0945)  Labs: Recent Labs    11/16/19 0817  HGB 11.4*  HCT 34.1*  PLT 295  CREATININE 0.80  TROPONINIHS 6    Estimated Creatinine Clearance: 99 mL/min (by C-G formula based on SCr of 0.8 mg/dL).   Medical History: Past Medical History:  Diagnosis Date  . Anemia   . CHF (congestive heart failure) ([place]) 04/2011   EF 15-20% from echo 05/19/11  . DOE (dyspnea on exertion)   . DVT (deep venous thrombosis) ([place]) 06/2011   LLE  . Fatigue   . HTN (hypertension)   . Menorrhagia    with iron deficient anemia  . NSTEMI (non-ST elevated myocardial infarction) ([place]) 10/26/2017  . NSTEMI (non-ST elevated myocardial infarction) ([place]) 10/27/2017  . Obesity   . Orthopnea   . Type II diabetes mellitus ([facility])     Medications:  Infusions:  . heparin    . nitroGLYCERIN      Assessment: 36 yof presented to the ED with chest pain. To start heparin IV. Baseline Hgb is slightly low and platelets are WNL. She is not on anticoagulation PTA.   Goal of Therapy:  Heparin level 0.3-0.7 units/ml Monitor platelets by anticoagulation protocol: Yes   Plan:  Heparin bolus 4000 units IV x 1 Heparin gtt 1000 units/hr Check a 6 hr heparin level Daily heparin level and CBC  Omarion Minnehan, Rande Lawman 11/16/2019,9:57 AM

## 2019-11-17 ENCOUNTER — Telehealth: Payer: Self-pay

## 2019-11-17 ENCOUNTER — Encounter (HOSPITAL_COMMUNITY)
Admission: EM | Disposition: A | Discharge status: Home / Self Care | Payer: Self-pay | Source: Home / Self Care | Attending: Cardiology

## 2019-11-17 HISTORY — PX: LEFT HEART CATH AND CORONARY ANGIOGRAPHY: CATH118249

## 2019-11-17 LAB — CBC
HCT: 32.3 % — ABNORMAL LOW (ref 36.0–46.0)
Hemoglobin: 10.5 g/dL — ABNORMAL LOW (ref 12.0–15.0)
MCH: 24.2 pg — ABNORMAL LOW (ref 26.0–34.0)
MCHC: 32.5 g/dL (ref 30.0–36.0)
MCV: 74.4 fL — ABNORMAL LOW (ref 80.0–100.0)
Platelets: 292 10*3/uL (ref 150–400)
RBC: 4.34 MIL/uL (ref 3.87–5.11)
RDW: 13.3 % (ref 11.5–15.5)
WBC: 5.1 10*3/uL (ref 4.0–10.5)
nRBC: 0 % (ref 0.0–0.2)

## 2019-11-17 LAB — LIPID PANEL
Cholesterol: 106 mg/dL (ref 0–200)
HDL: 34 mg/dL — ABNORMAL LOW (ref >40)
LDL Cholesterol: 62 mg/dL (ref 0–99)
Total CHOL/HDL Ratio: 3.1 RATIO
Triglycerides: 51 mg/dL (ref <150)
VLDL: 10 mg/dL (ref 0–40)

## 2019-11-17 LAB — GLUCOSE, CAPILLARY: Glucose-Capillary: 234 mg/dL — ABNORMAL HIGH (ref 70–99)

## 2019-11-17 LAB — HEPARIN LEVEL (UNFRACTIONATED): Heparin Unfractionated: 0.5 IU/mL (ref 0.30–0.70)

## 2019-11-17 LAB — HCG, SERUM, QUALITATIVE: Preg, Serum: NEGATIVE

## 2019-11-17 SURGERY — LEFT HEART CATH AND CORONARY ANGIOGRAPHY
Anesthesia: LOCAL

## 2019-11-17 MED ORDER — FENTANYL CITRATE (PF) 100 MCG/2ML IJ SOLN
INTRAMUSCULAR | Status: AC
  Filled 2019-11-17: qty 2

## 2019-11-17 MED ORDER — HEPARIN SODIUM (PORCINE) 1000 UNIT/ML IJ SOLN
INTRAMUSCULAR | Status: AC
  Filled 2019-11-17: qty 1

## 2019-11-17 MED ORDER — HEPARIN (PORCINE) IN NACL 1000-0.9 UT/500ML-% IV SOLN
INTRAVENOUS | Status: AC
  Filled 2019-11-17: qty 1000

## 2019-11-17 MED ORDER — MIDAZOLAM HCL 2 MG/2ML IJ SOLN
INTRAMUSCULAR | Status: DC | PRN
  Administered 2019-11-17: 1 mg via INTRAVENOUS

## 2019-11-17 MED ORDER — VERAPAMIL HCL 2.5 MG/ML IV SOLN
INTRAVENOUS | Status: AC
  Filled 2019-11-17: qty 2

## 2019-11-17 MED ORDER — MIDAZOLAM HCL 2 MG/2ML IJ SOLN
INTRAMUSCULAR | Status: AC
  Filled 2019-11-17: qty 2

## 2019-11-17 MED ORDER — ACETAMINOPHEN 325 MG PO TABS: 650.0000 mg | ORAL_TABLET | ORAL | Status: DC | PRN

## 2019-11-17 MED ORDER — FENTANYL CITRATE (PF) 100 MCG/2ML IJ SOLN
INTRAMUSCULAR | Status: DC | PRN
  Administered 2019-11-17: 25 ug via INTRAVENOUS

## 2019-11-17 MED ORDER — SODIUM CHLORIDE 0.9 % IV SOLN: INTRAVENOUS | Status: AC

## 2019-11-17 MED ORDER — ONDANSETRON HCL 4 MG/2ML IJ SOLN: 4.0000 mg | Freq: Four times a day (QID) | INTRAMUSCULAR | Status: DC | PRN

## 2019-11-17 MED ORDER — SODIUM CHLORIDE 0.9% FLUSH: 3.0000 mL | INTRAVENOUS | Status: DC | PRN

## 2019-11-17 MED ORDER — LIDOCAINE HCL (PF) 1 % IJ SOLN
INTRAMUSCULAR | Status: AC
  Filled 2019-11-17: qty 30

## 2019-11-17 MED ORDER — LABETALOL HCL 5 MG/ML IV SOLN: 10.0000 mg | INTRAVENOUS | Status: DC | PRN

## 2019-11-17 MED ORDER — SODIUM CHLORIDE 0.9% FLUSH: 3.0000 mL | Freq: Two times a day (BID) | INTRAVENOUS | Status: DC

## 2019-11-17 MED ORDER — HEPARIN (PORCINE) IN NACL 1000-0.9 UT/500ML-% IV SOLN
INTRAVENOUS | Status: DC | PRN
  Administered 2019-11-17 (×2): 500 mL

## 2019-11-17 MED ORDER — IOHEXOL 350 MG/ML SOLN
INTRAVENOUS | Status: DC | PRN
  Administered 2019-11-17: 55 mL via INTRA_ARTERIAL

## 2019-11-17 MED ORDER — PANTOPRAZOLE SODIUM 40 MG PO TBEC: 40.0000 mg | DELAYED_RELEASE_TABLET | Freq: Every day | ORAL | 1 refills | Status: DC

## 2019-11-17 MED ORDER — LIDOCAINE HCL (PF) 1 % IJ SOLN
INTRAMUSCULAR | Status: DC | PRN
  Administered 2019-11-17: 2 mL via SUBCUTANEOUS

## 2019-11-17 MED ORDER — VERAPAMIL HCL 2.5 MG/ML IV SOLN
INTRAVENOUS | Status: DC | PRN
  Administered 2019-11-17: 10 mL via INTRA_ARTERIAL

## 2019-11-17 MED ORDER — HEPARIN SODIUM (PORCINE) 1000 UNIT/ML IJ SOLN
INTRAMUSCULAR | Status: DC | PRN
  Administered 2019-11-17: 5000 [IU] via INTRAVENOUS

## 2019-11-17 MED ORDER — SODIUM CHLORIDE 0.9 % IV SOLN: 250.0000 mL | INTRAVENOUS | Status: DC | PRN

## 2019-11-17 MED ORDER — HYDRALAZINE HCL 20 MG/ML IJ SOLN: 10.0000 mg | INTRAMUSCULAR | Status: DC | PRN

## 2019-11-17 SURGICAL SUPPLY — 11 items
CATH INFINITI 5FR AL1 (CATHETERS) ×2 IMPLANT
CATH INFINITI JR4 5F (CATHETERS) ×2 IMPLANT
CATH OPTITORQUE TIG 4.0 5F (CATHETERS) ×2 IMPLANT
DEVICE RAD COMP TR BAND LRG (VASCULAR PRODUCTS) ×2 IMPLANT
GLIDESHEATH SLEND A-KIT 6F 22G (SHEATH) ×2 IMPLANT
GUIDEWIRE INQWIRE 1.5J.035X260 (WIRE) ×1 IMPLANT
INQWIRE 1.5J .035X260CM (WIRE) ×2
KIT HEART LEFT (KITS) ×2 IMPLANT
PACK CARDIAC CATHETERIZATION (CUSTOM PROCEDURE TRAY) ×2 IMPLANT
TRANSDUCER W/STOPCOCK (MISCELLANEOUS) ×2 IMPLANT
TUBING CIL FLEX 10 FLL-RA (TUBING) ×2 IMPLANT

## 2019-11-17 NOTE — Telephone Encounter (Signed)
Called pt per Dr. Virgina Jock to inform her [name] would be changes to 60mg  BID and to start Jardiance 10mg  Daily. Inform pt to come pick up some sample. Pt understood.

## 2019-11-17 NOTE — Interval H&P Note (Signed)
History and Physical Interval Note:  11/17/2019 7:43 AM  [name]  has presented today for surgery, with the diagnosis of unstable angina.  The various methods of treatment have been discussed with the patient and family. After consideration of risks, benefits and other options for treatment, the patient has consented to  Procedure(s): LEFT HEART CATH AND CORONARY ANGIOGRAPHY (N/A) as a surgical intervention.  The patient's history has been reviewed, patient examined, no change in status, stable for surgery.  I have reviewed the patient's chart and labs.  Questions were answered to the patient's satisfaction.    2016 Appropriate Use Criteria for Coronary Revascularization in Patients With Acute Coronary Syndrome NSTEMI/UA Intermediate Risk (TIMI Score 3-4) NSTEMI/Unstable angina, stabilized patient at Intermediate Risk (TIMI Score 3-4) Link Here: http://dodson-rose.net/ Indication:  Revascularization by PCI or CABG of 1 or more arteries in a patient with NSTEMI or unstable angina with Stabilization after presentation Intermediate risk for clinical events  A (7) Indication: 16; Score 7   [place]

## 2019-11-17 NOTE — Plan of Care (Signed)

## 2019-11-17 NOTE — Progress Notes (Signed)
Addendum to H&P 1/28.  Physical Exam  Constitutional: She is oriented to person, place, and time. She appears well-developed and well-nourished. No distress.  HENT:  Head: Normocephalic and atraumatic.  Eyes: Pupils are equal, round, and reactive to light. Conjunctivae are normal.  Neck: No JVD present.  Cardiovascular: Normal rate, regular rhythm and intact distal pulses.  No murmur heard. Pulmonary/Chest: Effort normal and breath sounds normal. She has no wheezes. She has no rales.  Abdominal: Soft. Bowel sounds are normal. There is no rebound.  Musculoskeletal:        General: No edema.  Lymphadenopathy:    She has no cervical adenopathy.  Neurological: She is alert and oriented to person, place, and time. No cranial nerve deficit.  Skin: Skin is warm and dry.  Psychiatric: She has a normal mood and affect.  Nursing note and vitals reviewed.

## 2019-11-17 NOTE — Discharge Instructions (Signed)
Radial Site Care  This sheet gives you information about how to care for yourself after your procedure. Your health care provider may also give you more specific instructions. If you have problems or questions, contact your health care provider. What can I expect after the procedure? After the procedure, it is common to have:  Bruising and tenderness at the catheter insertion area. Follow these instructions at home: Medicines  Take over-the-counter and prescription medicines only as told by your health care provider. Insertion site care  Follow instructions from your health care provider about how to take care of your insertion site. Make sure you: ? Wash your hands with soap and water before you change your bandage (dressing). If soap and water are not available, use hand sanitizer. ? Change your dressing as told by your health care provider. ? Leave stitches (sutures), skin glue, or adhesive strips in place. These skin closures may need to stay in place for 2 weeks or longer. If adhesive strip edges start to loosen and curl up, you may trim the loose edges. Do not remove adhesive strips completely unless your health care provider tells you to do that.  Check your insertion site every day for signs of infection. Check for: ? Redness, swelling, or pain. ? Fluid or blood. ? Pus or a bad smell. ? Warmth.  Do not take baths, swim, or use a hot tub until your health care provider approves.  You may shower 24-48 hours after the procedure, or as directed by your health care provider. ? Remove the dressing and gently wash the site with plain soap and water. ? Pat the area dry with a clean towel. ? Do not rub the site. That could cause bleeding.  Do not apply powder or lotion to the site. Activity   For 24 hours after the procedure, or as directed by your health care provider: ? Do not flex or bend the affected arm. ? Do not push or pull heavy objects with the affected arm. ? Do not  drive yourself home from the hospital or clinic. You may drive 24 hours after the procedure unless your health care provider tells you not to. ? Do not operate machinery or power tools.  Do not lift anything that is heavier than 10 lb (4.5 kg), or the limit that you are told, until your health care provider says that it is safe.  Ask your health care provider when it is okay to: ? Return to work or school. ? Resume usual physical activities or sports. ? Resume sexual activity. General instructions  If the catheter site starts to bleed, raise your arm and put firm pressure on the site. If the bleeding does not stop, get help right away. This is a medical emergency.  If you went home on the same day as your procedure, a responsible adult should be with you for the first 24 hours after you arrive home.  Keep all follow-up visits as told by your health care provider. This is important. Contact a health care provider if:  You have a fever.  You have redness, swelling, or yellow drainage around your insertion site. Get help right away if:  You have unusual pain at the radial site.  The catheter insertion area swells very fast.  The insertion area is bleeding, and the bleeding does not stop when you hold steady pressure on the area.  Your arm or hand becomes pale, cool, tingly, or numb. These symptoms may represent a serious problem   that is an emergency. Do not wait to see if the symptoms will go away. Get medical help right away. Call your local emergency services (911 in the [place] Do not drive yourself to the hospital. Summary  After the procedure, it is common to have bruising and tenderness at the site.  Follow instructions from your health care provider about how to take care of your radial site wound. Check the wound every day for signs of infection.  Do not lift anything that is heavier than 10 lb (4.5 kg), or the limit that you are told, until your health care provider says  that it is safe. This information is not intended to replace advice given to you by your health care provider. Make sure you discuss any questions you have with your health care provider. Document Revised: 11/10/2017 Document Reviewed: 11/10/2017 Elsevier Patient Education  2020 [employer]  

## 2019-11-17 NOTE — Progress Notes (Signed)
Discharge instructions reviewed with patient and family. Verbalized understanding. 

## 2019-11-17 NOTE — Discharge Summary (Signed)
Physician Discharge Summary  Patient ID: [name] MRN: [id number] DOB/AGE: October 24, 1965 54 y.o.  Admit date: 11/16/2019 Discharge date: 11/17/2019  Primary Discharge Diagnosis: Noncardiac chest pain  Secondary Discharge Diagnosis: Coronary artery disease Hypertension Uncontrolled type 2 Dm Obesity   Hospital Course:   54 year old African-American female with hypertension, type 2 diabetes mellitus, coronary artery disease status post mid RCA PCI 2013 and 2019, residual Mid LCx, OM1, OM2, Diag 2, prox-mid RCA, Rt PDA moderate to severe disease, now admitted with chest pain  Coronary angiography 11/17/2019: LM: Normal LAD: Diag 2 ostial 80% stenosis (unchanged since 10/2017) LCx: Ostial 40%, mid 70%, small OM 60% stenoses (unchanged since 10/2017) RCA: Prox 40% stenoses. Mid overlapping stents with no ISR. RPL focal 40% stenosis (unchanged since 10/2017)  Normal LVEDP  Impression: Unchanged coronary anatomy with patent RCA stents with no ISR. While she has moderate lesions, as described above, they are relatively unchanged over three caths in 2013, 2019, and 2021. She has not had any stable angina since RCA PCI in 2019. There is no evidence of myocardial injury on HS trop and echocardiogram. Her chest pain is unlikely to be of coronary origin. Consider noncardiac etiology.   Recommendation:  Continue aggressive medical management, diet and lifestyle modification. Recommend the following changes on her follow up outpatient visit.   Reduce Brilinta to 60 mg bid for medical management post PCI (2019).   Add Jardiance 10 mg for medical management of DM and CAD.    Will provide samples and prescription.  Will ambulate the patient post bed rest. If no recurrent chest pain/hypxia, will discharge home later today. Low suspicion for PE.   Echocardiogram 11/16/2019: LVEF 60-65%. No wall motion abnormality.  No significant valvular abnormality.   Did the patient have an acute  coronary syndrome (MI, NSTEMI, STEMI, etc) this admission?: No                               Did the patient have a percutaneous coronary intervention (stent / angioplasty)?:  No.    Discharge Exam: Blood pressure (!) 153/84, pulse 75, temperature 98.1 F (36.7 C), temperature source Oral, resp. rate 17, height 5\' 2"  (1.575 m), weight 117.4 kg, last menstrual period 06/16/2019, SpO2 100 %.   Physical Exam  Constitutional: She is oriented to person, place, and time. She appears well-developed and well-nourished. No distress.  HENT:  Head: Normocephalic and atraumatic.  Eyes: Pupils are equal, round, and reactive to light. Conjunctivae are normal.  Neck: No JVD present.  Cardiovascular: Normal rate, regular rhythm and intact distal pulses.  No murmur heard. Pulmonary/Chest: Effort normal and breath sounds normal. She has no wheezes. She has no rales.  Abdominal: Soft. Bowel sounds are normal. There is no rebound.  Musculoskeletal:        General: No edema.  Lymphadenopathy:    She has no cervical adenopathy.  Neurological: She is alert and oriented to person, place, and time. No cranial nerve deficit.  Skin: Skin is warm and dry.  Psychiatric: She has a normal mood and affect.  Nursing note and vitals reviewed.     Recommendations on discharge:  Calorie negative diet Medication compliance   Significant Diagnostic Studies:  Coronary angiography 11/17/2019: LM: Normal LAD: Diag 2 ostial 80% stenosis (unchanged since 10/2017) LCx: Ostial 40%, mid 70%, small OM 60% stenoses (unchanged since 10/2017) RCA: Prox 40% stenoses. Mid overlapping stents with no ISR. RPL focal 40%  stenosis (unchanged since 10/2017)  Normal LVEDP  Impression: Unchanged coronary anatomy with patent RCA stents with no ISR. While she has moderate lesions, as described above, they are relatively unchanged over three caths in 2013, 2019, and 2021. She has not had any stable angina since RCA PCI in 2019. There  is no evidence of myocardial injury on HS trop and echocardiogram. Her chest pain is unlikely to be of coronary origin. Consider noncardiac etiology.   Recommendation:  Continue aggressive medical management, diet and lifestyle modification. Recommend the following changes on her follow up outpatient visit.   Reduce Brilinta to 60 mg bid for medical management post PCI (2019).   Add Jardiance 10 mg for medical management of DM and CAD.    Will provide samples and prescription.  Will ambulate the patient post bed rest. If no recurrent chest pain/hypxia, will discharge home later today. Low suspicion for PE.   EKG 11/17/2019: Sinus rhythm. Normal EKG    Labs:   Lab Results  Component Value Date   WBC 5.1 11/17/2019   HGB 10.5 (L) 11/17/2019   HCT 32.3 (L) 11/17/2019   MCV 74.4 (L) 11/17/2019   PLT 292 11/17/2019    Recent Labs  Lab 11/16/19 0817  NA 139  K 4.1  CL 105  CO2 25  BUN 11  CREATININE 0.80  CALCIUM 9.0  PROT 7.0  BILITOT 0.1*  ALKPHOS 99  ALT 16  AST 15  GLUCOSE 279*    Lipid Panel     Component Value Date/Time   CHOL 106 11/17/2019 0505   TRIG 51 11/17/2019 0505   HDL 34 (L) 11/17/2019 0505   CHOLHDL 3.1 11/17/2019 0505   VLDL 10 11/17/2019 0505   LDLCALC 62 11/17/2019 0505    Results for [name], [name] (MRN [id number]) as of 11/17/2019 08:55  Ref. Range 11/16/2019 08:17 11/16/2019 10:35  Troponin I (High Sensitivity) Latest Ref Range: <18 ng/L 6 <2   HEMOGLOBIN A1C Lab Results  Component Value Date   HGBA1C 8.6 (H) 11/16/2019   MPG 200.12 11/16/2019    FOLLOW UP PLANS AND APPOINTMENTS  Allergies as of 11/17/2019      Reactions   Detrol [tolterodine Tartrate]    rash      Medication List    TAKE these medications   amLODipine 5 MG tablet Commonly known as: NORVASC Take 5 mg by mouth every evening.   aspirin 81 MG tablet Take 81 mg by mouth every morning.   atorvastatin 40 MG tablet Commonly known as: LIPITOR Take 40 mg by  mouth daily.   Brilinta 90 MG Tabs tablet Generic drug: ticagrelor TAKE 1 TABLET (90 MG TOTAL) BY MOUTH 2 (TWO) TIMES DAILY. What changed: See the new instructions.   carvedilol 25 MG tablet Commonly known as: Coreg Take 1 tablet (25 mg total) by mouth 2 (two) times daily.   furosemide 40 MG tablet Commonly known as: LASIX Take 40 mg by mouth daily.   Iron 325 (65 Fe) MG Tabs Take 1 tablet by mouth 2 (two) times daily.   losartan 100 MG tablet Commonly known as: COZAAR Take 100 mg by mouth daily.   metFORMIN 1000 MG tablet Commonly known as: GLUCOPHAGE Take 1,000 mg by mouth 2 (two) times daily with a meal.   nitroGLYCERIN 0.4 MG SL tablet Commonly known as: NITROSTAT Place 1 tablet (0.4 mg total) under the tongue every 5 (five) minutes x 3 doses as needed for chest pain.   ondansetron 4  MG disintegrating tablet Commonly known as: Zofran ODT Take 1 tablet (4 mg total) by mouth every 8 (eight) hours as needed for nausea or vomiting.   pantoprazole 40 MG tablet Commonly known as: Protonix Take 1 tablet (40 mg total) by mouth daily.   Victoza 18 MG/3ML Sopn Generic drug: liraglutide Inject 0.6 mg into the skin daily. Needs prior Auth.from MD.   Vitamin D-3 25 MCG (1000 UT) Caps Take 1 capsule by mouth daily.         Vernell Leep MD, [facility] Pager: [phone] Office: [phone] If no answer: [phone]

## 2019-11-21 ENCOUNTER — Ambulatory Visit: Payer: Self-pay | Admitting: Cardiology

## 2019-11-22 ENCOUNTER — Ambulatory Visit (INDEPENDENT_AMBULATORY_CARE_PROVIDER_SITE_OTHER): Payer: BLUE CROSS/BLUE SHIELD | Admitting: Cardiology

## 2019-11-22 ENCOUNTER — Encounter: Payer: Self-pay | Admitting: Cardiology

## 2019-11-22 VITALS — BP 144/84 | HR 87 | Temp 97.3°F | Ht 62.0 in | Wt 253.3 lb

## 2019-11-22 DIAGNOSIS — E1165 Type 2 diabetes mellitus with hyperglycemia: Secondary | ICD-10-CM

## 2019-11-22 DIAGNOSIS — I2510 Atherosclerotic heart disease of native coronary artery without angina pectoris: Secondary | ICD-10-CM

## 2019-11-22 DIAGNOSIS — I10 Essential (primary) hypertension: Secondary | ICD-10-CM

## 2019-11-22 MED ORDER — AMLODIPINE BESYLATE 10 MG PO TABS: 10.0000 mg | ORAL_TABLET | Freq: Every day | ORAL | 2 refills | Status: DC

## 2019-11-22 NOTE — Progress Notes (Signed)
Primary Physician:  Benito Mccreedy, MD   Patient ID: [name], female    DOB: 11/23/1965, 54 y.o.   MRN: [id number]  Subjective:    Chief Complaint  Patient presents with  . Chest Pain  . Hospitalization Follow-up    HPI: [name]  is a 54 y.o. female  with hypertension, type 2 diabetes mellitus, coronary artery disease status post mid RCA PCI 2013 and 2019, residual Mid LCx, OM1, OM2, Diag 2, prox-mid RCA, Rt PDA moderate to severe disease, recently admitted with chest pain on 11/16/2019 in which she underwent coronary angiogram on 11/17/19 that showed stable CAD with patent RCA stents. Chest pain felt to be noncardiac etiology. She now presents for hospital follow up.  She is doing well, she has not had any recurrence of chest pain. Brilinta has been changed to 60 mg. She has also been started on Jardiance 10 mg daily. She has had increased frequency in urination, and has since stopped her lasix.   Past Medical History:  Diagnosis Date  . Anemia   . CHF (congestive heart failure) ([place]) 04/2011   EF 15-20% from echo 05/19/11  . DOE (dyspnea on exertion)   . DVT (deep venous thrombosis) ([place]) 06/2011   LLE  . Fatigue   . HTN (hypertension)   . Menorrhagia    with iron deficient anemia  . NSTEMI (non-ST elevated myocardial infarction) ([place]) 10/26/2017  . NSTEMI (non-ST elevated myocardial infarction) ([place]) 10/27/2017  . Obesity   . Orthopnea   . Type II diabetes mellitus ([place])     Past Surgical History:  Procedure Laterality Date  . CORONARY ANGIOPLASTY WITH STENT PLACEMENT  2013;  10/27/2017  . CORONARY STENT INTERVENTION N/A 10/27/2017   Procedure: CORONARY STENT INTERVENTION;  Surgeon: [name], MD;  Location: [place] [facility];  Service: Cardiovascular;  Laterality: N/A;  . LEFT HEART CATH AND CORONARY ANGIOGRAPHY N/A 10/27/2017   Procedure: LEFT HEART CATH AND CORONARY ANGIOGRAPHY;  Surgeon: [name], MD;  Location: [facility];  Service: Cardiovascular;  Laterality: N/A;  . LEFT HEART CATH AND CORONARY ANGIOGRAPHY N/A 11/17/2019   Procedure: LEFT HEART CATH AND CORONARY ANGIOGRAPHY;  Surgeon: [name], MD;  Location: [facility];  Service: Cardiovascular;  Laterality: N/A;  . LEFT HEART CATHETERIZATION WITH CORONARY ANGIOGRAM N/A 11/17/2011   Procedure: LEFT HEART CATHETERIZATION WITH CORONARY ANGIOGRAM;  Surgeon: Laverda Page, MD;  Location: [facility] C[facility] LAB;  Service: Cardiovascular;  Laterality: N/A;  . OVARIAN CYST REMOVAL    . TUBAL LIGATION  2006  . ULTRASOUND GUIDANCE FOR VASCULAR ACCESS  10/27/2017   Procedure: Ultrasound Guidance For Vascular Access;  Surgeon: [name], MD;  Location: [place] [facility];  Service: Cardiovascular;;    Social History   Socioeconomic History  . Marital status: Widowed    Spouse name: Not on file  . Number of children: 3  . Years of education: Not on file  . Highest education level: Not on file  Occupational History  . Occupation: Psychologist, educational  Tobacco Use  . Smoking status: Former Smoker    Packs/day: 0.50    Years: 5.00    Pack years: 2.50    Types: Cigarettes    Quit date: 10/19/1993    Years since quitting: 26.1  . Smokeless tobacco: Never Used  Substance and Sexual Activity  . Alcohol use: No  . Drug use: No  . Sexual activity: Not  Currently  Other Topics Concern  . Not on file  Social History Narrative  . Not on file   Social Determinants of Health   Financial Resource Strain:   . Difficulty of Paying Living Expenses: Not on file  Food Insecurity:   . Worried About Charity fundraiser in the Last Year: Not on file  . Ran Out of Food in the Last Year: Not on file  Transportation Needs:   . Lack of Transportation (Medical): Not on file  . Lack of Transportation (Non-Medical): Not on file  Physical Activity:   . Days of Exercise per Week: Not on file  . Minutes of Exercise per Session: Not on file  Stress:   .  Feeling of Stress : Not on file  Social Connections:   . Frequency of Communication with Friends and Family: Not on file  . Frequency of Social Gatherings with Friends and Family: Not on file  . Attends Religious Services: Not on file  . Active Member of Clubs or Organizations: Not on file  . Attends Archivist Meetings: Not on file  . Marital Status: Not on file  Intimate Partner Violence:   . Fear of Current or Ex-Partner: Not on file  . Emotionally Abused: Not on file  . Physically Abused: Not on file  . Sexually Abused: Not on file    Review of Systems  Constitution: Negative for decreased appetite, malaise/fatigue, weight gain and weight loss.  Eyes: Negative for visual disturbance.  Cardiovascular: Negative for chest pain, claudication, dyspnea on exertion, leg swelling, orthopnea, palpitations and syncope.  Respiratory: Negative for hemoptysis and wheezing.   Endocrine: Negative for cold intolerance and heat intolerance.  Hematologic/Lymphatic: Does not bruise/bleed easily.  Skin: Negative for nail changes.  Musculoskeletal: Negative for muscle weakness and myalgias.  Gastrointestinal: Negative for abdominal pain, change in bowel habit, nausea and vomiting.  Neurological: Negative for difficulty with concentration, dizziness, focal weakness and headaches.  Psychiatric/Behavioral: Negative for altered mental status and suicidal ideas.  All other systems reviewed and are negative.     Objective:  Blood pressure (!) 144/84, pulse 87, temperature (!) 97.3 F (36.3 C), height 5\' 2"  (1.575 m), weight 253 lb 4.8 oz (114.9 kg), SpO2 99 %. Body mass index is 46.33 kg/m.    Physical Exam  Constitutional: She is oriented to person, place, and time. Vital signs are normal. She appears well-developed and well-nourished.  Morbidly obese  HENT:  Head: Normocephalic and atraumatic.  Cardiovascular: Normal rate, regular rhythm, normal heart sounds and intact distal pulses.    Pulses:      Radial pulses are 2+ on the right side and 2+ on the left side.  No hematoma. Minimal ecchymosis to right radial site  Pulmonary/Chest: Effort normal and breath sounds normal. No accessory muscle usage. No respiratory distress.  Abdominal: Soft. Bowel sounds are normal.  Musculoskeletal:        General: Normal range of motion.     Cervical back: Normal range of motion.  Neurological: She is alert and oriented to person, place, and time.  Skin: Skin is warm and dry.  Vitals reviewed.  Radiology: No results found.  Laboratory examination:    CMP Latest Ref Rng & Units 11/16/2019 10/29/2017 10/28/2017  Glucose 70 - 99 mg/dL 279(H) 218(H) 231(H)  BUN 6 - 20 mg/dL 11 11 10   Creatinine 0.44 - 1.00 mg/dL 0.80 0.81 0.71  Sodium 135 - 145 mmol/L 139 136 136  Potassium 3.5 - 5.1  mmol/L 4.1 3.9 3.9  Chloride 98 - 111 mmol/L 105 107 106  CO2 22 - 32 mmol/L 25 21(L) 21(L)  Calcium 8.9 - 10.3 mg/dL 9.0 9.1 8.6(L)  Total Protein 6.5 - 8.1 g/dL 7.0 - -  Total Bilirubin 0.3 - 1.2 mg/dL 0.1(L) - -  Alkaline Phos 38 - 126 U/L 99 - -  AST 15 - 41 U/L 15 - -  ALT 0 - 44 U/L 16 - -   CBC Latest Ref Rng & Units 11/17/2019 11/16/2019 10/29/2017  WBC 4.0 - 10.5 K/uL 5.1 5.0 5.4  Hemoglobin 12.0 - 15.0 g/dL 10.5(L) 11.4(L) 10.0(L)  Hematocrit 36.0 - 46.0 % 32.3(L) 34.1(L) 31.1(L)  Platelets 150 - 400 K/uL 292 295 232   Lipid Panel     Component Value Date/Time   CHOL 106 11/17/2019 0505   TRIG 51 11/17/2019 0505   HDL 34 (L) 11/17/2019 0505   CHOLHDL 3.1 11/17/2019 0505   VLDL 10 11/17/2019 0505   LDLCALC 62 11/17/2019 0505   HEMOGLOBIN A1C Lab Results  Component Value Date   HGBA1C 8.6 (H) 11/16/2019   MPG 200.12 11/16/2019   TSH No results for input(s): TSH in the last 8760 hours.  PRN Meds:. Medications Discontinued During This Encounter  Medication Reason  . BRILINTA 90 MG TABS tablet Error  . VICTOZA 18 MG/3ML SOPN Error  . amLODipine (NORVASC) 5 MG tablet Dose  change   Current Meds  Medication Sig  . aspirin 81 MG tablet Take 81 mg by mouth every morning.   Marland Kitchen atorvastatin (LIPITOR) 40 MG tablet Take 40 mg by mouth daily.  . carvedilol (COREG) 25 MG tablet Take 1 tablet (25 mg total) by mouth 2 (two) times daily.  . Cholecalciferol (VITAMIN D-3) 1000 UNITS CAPS Take 1 capsule by mouth daily.   . empagliflozin (JARDIANCE) 10 MG TABS tablet Take 10 mg by mouth daily.  . Ferrous Sulfate (IRON) 325 (65 FE) MG TABS Take 1 tablet by mouth 2 (two) times daily.    . furosemide (LASIX) 40 MG tablet Take 40 mg by mouth daily.   Marland Kitchen losartan (COZAAR) 100 MG tablet Take 100 mg by mouth daily.  . metFORMIN (GLUCOPHAGE) 1000 MG tablet Take 1,000 mg by mouth 2 (two) times daily with a meal.   . nitroGLYCERIN (NITROSTAT) 0.4 MG SL tablet Place 1 tablet (0.4 mg total) under the tongue every 5 (five) minutes x 3 doses as needed for chest pain.  Marland Kitchen ondansetron (ZOFRAN ODT) 4 MG disintegrating tablet Take 1 tablet (4 mg total) by mouth every 8 (eight) hours as needed for nausea or vomiting.  . pantoprazole (PROTONIX) 40 MG tablet Take 1 tablet (40 mg total) by mouth daily.  . ticagrelor (BRILINTA) 60 MG TABS tablet Take 60 mg by mouth 2 (two) times daily.  . [DISCONTINUED] amLODipine (NORVASC) 5 MG tablet Take 5 mg by mouth every evening.     Cardiac Studies:   Coronary angiography 11/17/2019: LM: Normal LAD: Diag 2 ostial 80% stenosis (unchanged since 10/2017) LCx: Ostial 40%, mid 70%, small OM 60% stenoses (unchanged since 10/2017) RCA: Prox 40% stenoses. Mid overlapping stents with no ISR. RPL focal 40% stenosis (unchanged since 10/2017)  Normal LVEDP  Impression: Unchanged coronary anatomy with patent RCA stents with no ISR. While she has moderate lesions, as described above, they are relatively unchanged over three caths in 2013, 2019, and 2021. She has not had any stable angina since RCA PCI in 2019. There is no evidence  of myocardial injury on HS trop and  echocardiogram. Her chest pain is unlikely to be of coronary origin. Consider noncardiac etiology.   Echo 11/16/2019: 1. Left ventricular ejection fraction, by visual estimation, is 60 to  65%. The left ventricle has normal function. Left ventricular septal wall  thickness was normal. Normal left ventricular posterior wall thickness.  There is no left ventricular  hypertrophy.  2. The left ventricle has no regional wall motion abnormalities.  3. Global right ventricle has normal systolic function.The right  ventricular size is normal. No increase in right ventricular wall  thickness.  4. Left atrial size was normal.  5. Right atrial size was normal.  6. The mitral valve is normal in structure. No evidence of mitral valve  regurgitation. No evidence of mitral stenosis.  7. The tricuspid valve is normal in structure.  8. The tricuspid valve is normal in structure. Tricuspid valve  regurgitation is trivial.  9. The aortic valve is tricuspid. Aortic valve regurgitation is not  visualized. No evidence of aortic valve sclerosis or stenosis.  10. The pulmonic valve was normal in structure. Pulmonic valve  regurgitation is not visualized.  11. The inferior vena cava is normal in size with greater than 50%  respiratory variability, suggesting right atrial pressure of 3 mmHg.  12. Left ventricular ejection fraction by PLAX is is 66 %   Assessment:   Atherosclerosis of native coronary artery of native heart without angina pectoris - Plan: EKG 12-Lead  Essential hypertension  Morbidly obese (HCC)  Uncontrolled type 2 diabetes mellitus with hyperglycemia ([place])  EKG 11/22/2019: Normal sinus rhythm at 83 bpm, normal axis, diffuse nonspecific T wave abnormality.  Recommendations:   [name]  is a 54 y.o. female  with hypertension, type 2 diabetes mellitus, coronary artery disease status post mid RCA PCI 2013 and 2019, residual Mid LCx, OM1, OM2, Diag 2, prox-mid RCA, Rt PDA  moderate to severe disease, recently admitted with chest pain on 11/16/2019 in which she underwent coronary angiogram on 11/17/19 that showed stable CAD with patent RCA stents. Chest pain felt to be noncardiac etiology. She now presents for hospital follow up.  Patient is doing well without any symptoms of angina or chest pain. Suspect noncardiac etiology for her chest pain given coronary angiogram findings, which were again discussed with her today. Would recommend aggressive risk factor modification. She is complaining of increased urination since being on Jardiance. Will continue to use Lasix on a PRN basis. Hopefully this will improve over the next few days, if she continues to have issues with this, she is instructed to contact us and will consider changing medications; however, in view of CV benefits with Jardiance would like to try to keep on board. Encouraged her to continue to work on diabetes control.  Blood pressure is slightly elevated today, will increase Amlodipine to 10 mg daily. Encouraged her to work on weight loss with diet. She is asking about returning to work. 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 She can return to work next [place] (02/08) without any restrictions. Right radial access site is healing well. I will plan to see her back in 6 weeks for follow up on HTN, but encouraged her to contact us sooner if needed.  Miquel Dunn, MSN, APRN, FNP-C [facility]. [place] Office: [phone] Fax: [phone]

## 2019-11-24 ENCOUNTER — Encounter (HOSPITAL_COMMUNITY): Payer: Self-pay | Admitting: Emergency Medicine

## 2019-11-24 ENCOUNTER — Other Ambulatory Visit: Payer: Self-pay

## 2019-11-24 ENCOUNTER — Ambulatory Visit
Admission: EM | Admit: 2019-11-24 | Discharge: 2019-11-24 | Disposition: A | Discharge status: Home / Self Care | Payer: BLUE CROSS/BLUE SHIELD | Source: Home / Self Care

## 2019-11-24 ENCOUNTER — Emergency Department (HOSPITAL_COMMUNITY)
Admission: EM | Admit: 2019-11-24 | Discharge: 2019-11-25 | Disposition: A | Discharge status: Home / Self Care | Payer: BLUE CROSS/BLUE SHIELD | Attending: Emergency Medicine | Admitting: Emergency Medicine

## 2019-11-24 ENCOUNTER — Emergency Department (HOSPITAL_COMMUNITY): Payer: BLUE CROSS/BLUE SHIELD

## 2019-11-24 ENCOUNTER — Encounter: Payer: Self-pay | Admitting: Emergency Medicine

## 2019-11-24 DIAGNOSIS — I509 Heart failure, unspecified: Secondary | ICD-10-CM | POA: Insufficient documentation

## 2019-11-24 DIAGNOSIS — R0789 Other chest pain: Secondary | ICD-10-CM

## 2019-11-24 DIAGNOSIS — R0602 Shortness of breath: Secondary | ICD-10-CM | POA: Diagnosis not present

## 2019-11-24 DIAGNOSIS — R079 Chest pain, unspecified: Secondary | ICD-10-CM | POA: Diagnosis not present

## 2019-11-24 DIAGNOSIS — R531 Weakness: Secondary | ICD-10-CM

## 2019-11-24 DIAGNOSIS — I110 Hypertensive heart disease with heart failure: Secondary | ICD-10-CM | POA: Diagnosis not present

## 2019-11-24 DIAGNOSIS — I208 Other forms of angina pectoris: Secondary | ICD-10-CM | POA: Diagnosis not present

## 2019-11-24 DIAGNOSIS — R42 Dizziness and giddiness: Secondary | ICD-10-CM | POA: Diagnosis not present

## 2019-11-24 DIAGNOSIS — Z87891 Personal history of nicotine dependence: Secondary | ICD-10-CM | POA: Diagnosis not present

## 2019-11-24 DIAGNOSIS — I252 Old myocardial infarction: Secondary | ICD-10-CM | POA: Insufficient documentation

## 2019-11-24 DIAGNOSIS — E119 Type 2 diabetes mellitus without complications: Secondary | ICD-10-CM | POA: Diagnosis not present

## 2019-11-24 DIAGNOSIS — R002 Palpitations: Secondary | ICD-10-CM | POA: Diagnosis not present

## 2019-11-24 LAB — CBC
HCT: 34.1 % — ABNORMAL LOW (ref 36.0–46.0)
Hemoglobin: 11.3 g/dL — ABNORMAL LOW (ref 12.0–15.0)
MCH: 24.8 pg — ABNORMAL LOW (ref 26.0–34.0)
MCHC: 33.1 g/dL (ref 30.0–36.0)
MCV: 74.9 fL — ABNORMAL LOW (ref 80.0–100.0)
Platelets: 349 10*3/uL (ref 150–400)
RBC: 4.55 MIL/uL (ref 3.87–5.11)
RDW: 13.7 % (ref 11.5–15.5)
WBC: 5.7 10*3/uL (ref 4.0–10.5)
nRBC: 0 % (ref 0.0–0.2)

## 2019-11-24 LAB — BASIC METABOLIC PANEL
Anion gap: 12 (ref 5–15)
BUN: 24 mg/dL — ABNORMAL HIGH (ref 6–20)
CO2: 24 mmol/L (ref 22–32)
Calcium: 9.9 mg/dL (ref 8.9–10.3)
Chloride: 104 mmol/L (ref 98–111)
Creatinine, Ser: 1 mg/dL (ref 0.44–1.00)
GFR calc Af Amer: >60 mL/min (ref >60)
GFR calc non Af Amer: >60 mL/min (ref >60)
Glucose, Bld: 192 mg/dL — ABNORMAL HIGH (ref 70–99)
Potassium: 4.6 mmol/L (ref 3.5–5.1)
Sodium: 140 mmol/L (ref 135–145)

## 2019-11-24 LAB — I-STAT BETA HCG BLOOD, ED (MC, WL, AP ONLY): I-stat hCG, quantitative: <5 m[IU]/mL (ref <5)

## 2019-11-24 LAB — TROPONIN I (HIGH SENSITIVITY)
Troponin I (High Sensitivity): 3 ng/L (ref <18)
Troponin I (High Sensitivity): <2 ng/L (ref <18)

## 2019-11-24 LAB — POCT FASTING CBG KUC MANUAL ENTRY: POCT Glucose (KUC): 239 mg/dL — AB (ref 70–99)

## 2019-11-24 MED ORDER — SODIUM CHLORIDE 0.9% FLUSH: 3.0000 mL | Freq: Once | INTRAVENOUS | Status: DC

## 2019-11-24 NOTE — Discharge Instructions (Signed)
54 year old female with history of noninsulin dependant DM, CHF, HTN, NSTEMI, DVT comes in for acute onset of shortness of breath, weakness, chest pressure, palpitations shortly prior to arrival. She took her BP and HR, and found HR to be 46, and BP around 130s/80s. State waited 10 mins and retook with HR 110 and BP stable. She had unstable angina symptoms 1 week ago, and had heart cath 11/17/2019. Per cardiology note for visit 11/22/2019, cath finding stable and chest pain was thought to be noncardiac related. She also had medications adjusted this past week with decrease in brilinta to 60mg , started on jardiance 10mg  daily. She discontinued lasix due to urinary frequency. 2 days ago during card office visit, increased amlodipine to 10mg . Patient states has been doing this as directed without any complications until current symptom onset.  Patient was able to ambulate on own and walked into exam room without difficulty. Stable vitals. Heart: RRR Lungs: Speaking in full sentences without difficulty. LCTAB. MSK: no obvious leg swelling. Neuro: alert and oriented x 3. Strength 5/5 for BUE and BLE  EKG NSR, 75bpm, T wave inversion to V1 change from EKG 02/19/2020.  CBG 239  Given recent heart cath with sudden onset shortness of breath, weakness, chest pressure, palpitations, patient discharged to ED for further evaluation via EMS.

## 2019-11-24 NOTE — ED Triage Notes (Signed)
Patient presents to [name] for assessment of sudden onset of shortness of breath sitting still, fatigue, palpitations, and noting her BP machine giving her a HR of 46, then reading 110 shortly after.  Denies chest pain.

## 2019-11-24 NOTE — ED Provider Notes (Signed)
54 year old female with history of noninsulin dependant DM, CHF, HTN, NSTEMI, DVT comes in for acute onset of shortness of breath, weakness, chest pressure, palpitations shortly prior to arrival. She took her BP and HR, and found HR to be 46, and BP around 130s/80s. State waited 10 mins and retook with HR 110 and BP stable. She had unstable angina symptoms 1 week ago, and had heart cath 11/17/2019. Per cardiology note for visit 11/22/2019, cath finding stable and chest pain was thought to be noncardiac related. She also had medications adjusted this past week with decrease in brilinta to 60mg , started on jardiance 10mg  daily. She discontinued lasix due to urinary frequency. 2 days ago during card office visit, increased amlodipine to 10mg . Patient states has been doing this as directed without any complications until current symptom onset.  Patient was able to ambulate on own and walked into exam room without difficulty. Stable vitals. Heart: RRR Lungs: Speaking in full sentences without difficulty. LCTAB. MSK: no obvious leg swelling. Neuro: alert and oriented x 3. Strength 5/5 for BUE and BLE  EKG NSR, 75bpm, T wave inversion to V1 change from EKG 02/19/2020.  CBG 239  Given recent heart cath with sudden onset shortness of breath, weakness, chest pressure, palpitations, patient discharged to ED for further evaluation via EMS.    Ok Edwards, PA-C 11/24/19 [phone]

## 2019-11-24 NOTE — ED Triage Notes (Signed)
Pt BIB [facility] from [facility], c/o sudden onset shortness of breath while walking up the stairs and left chest pressure that radiates to her neck, back and left arm. Hx MI, recent cardiac cath. Rates the pressure 7/10. EMS VS: BP 142/84, HR 70, RR 16, SpO2 100% room air

## 2019-11-24 NOTE — ED Notes (Signed)
[facility] on site for patient transport.  Report given.

## 2019-11-24 NOTE — ED Notes (Signed)
Called [facility] for non-emergency transport

## 2019-11-25 NOTE — ED Provider Notes (Signed)
[facility] Emergency Department Provider Note MRN:  [id number]  Arrival date & time: 11/25/19     Chief Complaint   Chest Pain and Shortness of Breath   History of Present Illness   [name] is a 54 y.o. year-old female with a history of CHF, CAD, diabetes presenting to the ED with chief complaint of chest pain or shortness of breath.  At about noon today, patient walked up a flight of stairs, and shortly afterwards became mildly short of breath with central chest pressure.  Denies dizziness or diaphoresis, no nausea or vomiting.  Pain has slowly improved since that time.  Has nitro at home but did not take it because it gives her headaches.  Denies recent fever or cough.  No leg pain or swelling.  No other complaints.  Review of Systems  A complete 10 system review of systems was obtained and all systems are negative except as noted in the HPI and PMH.   Patient's Health History    Past Medical History:  Diagnosis Date  . Anemia   . CHF (congestive heart failure) ([place]) 04/2011   EF 15-20% from echo 05/19/11  . DOE (dyspnea on exertion)   . DVT (deep venous thrombosis) ([place]) 06/2011   LLE  . Fatigue   . HTN (hypertension)   . Menorrhagia    with iron deficient anemia  . NSTEMI (non-ST elevated myocardial infarction) ([place]) 10/26/2017  . NSTEMI (non-ST elevated myocardial infarction) ([place]) 10/27/2017  . Obesity   . Orthopnea   . Type II diabetes mellitus ([place])     Past Surgical History:  Procedure Laterality Date  . CORONARY ANGIOPLASTY WITH STENT PLACEMENT  2013;  10/27/2017  . CORONARY STENT INTERVENTION N/A 10/27/2017   Procedure: CORONARY STENT INTERVENTION;  Surgeon: [name], MD;  Location: [place] [facility];  Service: Cardiovascular;  Laterality: N/A;  . LEFT HEART CATH AND CORONARY ANGIOGRAPHY N/A 10/27/2017   Procedure: LEFT HEART CATH AND CORONARY ANGIOGRAPHY;  Surgeon: [name], MD;  Location: [place] [facility];   Service: Cardiovascular;  Laterality: N/A;  . LEFT HEART CATH AND CORONARY ANGIOGRAPHY N/A 11/17/2019   Procedure: LEFT HEART CATH AND CORONARY ANGIOGRAPHY;  Surgeon: [name], MD;  Location: [place] [facility];  Service: Cardiovascular;  Laterality: N/A;  . LEFT HEART CATHETERIZATION WITH CORONARY ANGIOGRAM N/A 11/17/2011   Procedure: LEFT HEART CATHETERIZATION WITH CORONARY ANGIOGRAM;  Surgeon: Laverda Page, MD;  Location: [facility] CATH LAB;  Service: Cardiovascular;  Laterality: N/A;  . OVARIAN CYST REMOVAL    . TUBAL LIGATION  2006  . ULTRASOUND GUIDANCE FOR VASCULAR ACCESS  10/27/2017   Procedure: Ultrasound Guidance For Vascular Access;  Surgeon: [name], MD;  Location: [place] [facility];  Service: Cardiovascular;;    Family History  Problem Relation Age of Onset  . Hypertension Mother   . Stroke Father   . Heart attack Sister   . Stroke Sister     Social History   Socioeconomic History  . Marital status: Widowed    Spouse name: Not on file  . Number of children: 3  . Years of education: Not on file  . Highest education level: Not on file  Occupational History  . Occupation: Psychologist, educational  Tobacco Use  . Smoking status: Former Smoker    Packs/day: 0.50    Years: 5.00    Pack years: 2.50    Types: Cigarettes    Quit date:  10/19/1993    Years since quitting: 26.1  . Smokeless tobacco: Never Used  Substance and Sexual Activity  . Alcohol use: No  . Drug use: No  . Sexual activity: Not Currently  Other Topics Concern  . Not on file  Social History Narrative  . Not on file   Social Determinants of Health   Financial Resource Strain:   . Difficulty of Paying Living Expenses: Not on file  Food Insecurity:   . Worried About Charity fundraiser in the Last Year: Not on file  . Ran Out of Food in the Last Year: Not on file  Transportation Needs:   . Lack of Transportation (Medical): Not on file  . Lack of Transportation (Non-Medical): Not on  file  Physical Activity:   . Days of Exercise per Week: Not on file  . Minutes of Exercise per Session: Not on file  Stress:   . Feeling of Stress : Not on file  Social Connections:   . Frequency of Communication with Friends and Family: Not on file  . Frequency of Social Gatherings with Friends and Family: Not on file  . Attends Religious Services: Not on file  . Active Member of Clubs or Organizations: Not on file  . Attends Archivist Meetings: Not on file  . Marital Status: Not on file  Intimate Partner Violence:   . Fear of Current or Ex-Partner: Not on file  . Emotionally Abused: Not on file  . Physically Abused: Not on file  . Sexually Abused: Not on file     Physical Exam   Vitals:   11/24/19 1955 11/24/19 2306  BP: 114/72 113/73  Pulse: 60 64  Resp: 18 18  Temp: 97.6 F (36.4 C) (!) 97.4 F (36.3 C)  SpO2: 100% 100%    CONSTITUTIONAL: Well-appearing, NAD NEURO:  Alert and oriented x 3, no focal deficits EYES:  eyes equal and reactive ENT/NECK:  no LAD, no JVD CARDIO: Regular rate, well-perfused, normal S1 and S2 PULM:  CTAB no wheezing or rhonchi GI/GU:  normal bowel sounds, non-distended, non-tender MSK/SPINE:  No gross deformities, no edema SKIN:  no rash, atraumatic PSYCH:  Appropriate speech and behavior  *Additional and/or pertinent findings included in MDM below  Diagnostic and Interventional Summary    EKG Interpretation  Date/Time:  November 23, 2018 at 17: 16: 24 Ventricular Rate:  78 PR Interval:  154 QRS Duration: 90 QT Interval: 372   QTC Calculation: 424 R Axis:     Text Interpretation: Sinus rhythm, no significant change from prior Confirmed by Dr. Gerlene Fee at 12:10 AM      Cardiac Monitoring Interpretation:  Labs Reviewed  CBC - Abnormal; Notable for the following components:      Result Value   Hemoglobin 11.3 (*)    HCT 34.1 (*)    MCV 74.9 (*)    MCH 24.8 (*)    All other components within normal limits    BASIC METABOLIC PANEL - Abnormal; Notable for the following components:   Glucose, Bld 192 (*)    BUN 24 (*)    All other components within normal limits  I-STAT BETA HCG BLOOD, ED (MC, [facility], AP ONLY)  TROPONIN I (HIGH SENSITIVITY)  TROPONIN I (HIGH SENSITIVITY)    DG Chest 2 View  Final Result      Medications  sodium chloride flush (NS) 0.9 % injection 3 mL (has no administration in time range)     Procedures  /  Critical Care Procedures  ED Course and Medical Decision Making  I have reviewed the triage vital signs, the nursing notes, and pertinent available records from the EMR.  Pertinent labs & imaging results that were available during my care of the patient were reviewed by me and considered in my medical decision making (see below for details).     Chest pain with exertion, history of CAD, recent hospital admission for chest pain rule out, was favored to be a noncardiac etiology at that time though recent cardiac CT demonstrating diffuse disease.  Pain is improving without exertion.  EKG unchanged, troponin negative x2.  All consistent with stable angina.  No indication for admission or further testing at this time, appropriate for close cardiology follow-up.  Strict return precautions for chest pain occurring at rest.    Barth Kirks. Sedonia Small, [place] [email or web address]  Final Clinical Impressions(s) / ED Diagnoses     ICD-10-CM   1. Stable angina ([place])  I20.8     ED Discharge Orders    None       Discharge Instructions Discussed with and Provided to Patient:     Discharge Instructions     You were evaluated in the Emergency Department and after careful evaluation, we did not find any emergent condition requiring admission or further testing in the hospital.  Your exam/testing today is overall reassuring.  No signs of heart damage today.  Please follow-up closely with your cardiologist.  Please return to  the Emergency Department if you experience any worsening of your condition.  We encourage you to follow up with a primary care provider.  Thank you for allowing [place] to be a part of your care.       Maudie Flakes, MD 11/25/19 Berniece Salines

## 2019-11-25 NOTE — Discharge Instructions (Addendum)
You were evaluated in the Emergency Department and after careful evaluation, we did not find any emergent condition requiring admission or further testing in the hospital.  Your exam/testing today is overall reassuring.  No signs of heart damage today.  Please follow-up closely with your cardiologist.  Please return to the Emergency Department if you experience any worsening of your condition.  We encourage you to follow up with a primary care provider.  Thank you for allowing [place] to be a part of your care.

## 2019-12-11 DIAGNOSIS — I2510 Atherosclerotic heart disease of native coronary artery without angina pectoris: Secondary | ICD-10-CM | POA: Diagnosis not present

## 2019-12-11 DIAGNOSIS — I252 Old myocardial infarction: Secondary | ICD-10-CM | POA: Diagnosis not present

## 2019-12-11 DIAGNOSIS — I10 Essential (primary) hypertension: Secondary | ICD-10-CM | POA: Diagnosis not present

## 2019-12-11 DIAGNOSIS — E119 Type 2 diabetes mellitus without complications: Secondary | ICD-10-CM | POA: Diagnosis not present

## 2019-12-22 ENCOUNTER — Other Ambulatory Visit: Payer: Self-pay

## 2019-12-22 MED ORDER — TICAGRELOR 60 MG PO TABS: 60.0000 mg | ORAL_TABLET | Freq: Two times a day (BID) | ORAL | 6 refills | Status: DC

## 2019-12-22 NOTE — Telephone Encounter (Signed)
Patient requested that Brilinta be sent to her pharmacy.

## 2020-01-04 ENCOUNTER — Ambulatory Visit: Payer: BLUE CROSS/BLUE SHIELD | Admitting: Cardiology

## 2020-01-15 ENCOUNTER — Encounter: Payer: Self-pay | Admitting: Cardiology

## 2020-01-15 ENCOUNTER — Other Ambulatory Visit: Payer: Self-pay

## 2020-01-15 ENCOUNTER — Ambulatory Visit: Payer: BLUE CROSS/BLUE SHIELD | Admitting: Cardiology

## 2020-01-15 VITALS — BP 135/69 | HR 77 | Temp 97.8°F | Ht 62.0 in | Wt 264.0 lb

## 2020-01-15 DIAGNOSIS — I2510 Atherosclerotic heart disease of native coronary artery without angina pectoris: Secondary | ICD-10-CM | POA: Diagnosis not present

## 2020-01-15 DIAGNOSIS — E1165 Type 2 diabetes mellitus with hyperglycemia: Secondary | ICD-10-CM

## 2020-01-15 DIAGNOSIS — I10 Essential (primary) hypertension: Secondary | ICD-10-CM

## 2020-01-15 NOTE — Progress Notes (Signed)
Follow up visit  Subjective:   [name], female    DOB: [birth year], 54 y.o.   MRN: [id number]   HPI  Chief Complaint  Patient presents with  . Hypertension    follow up    54 year old African-American female with hypertension, type 2 diabetes mellitus, coronary artery disease status post mid RCA PCI 2013 and 2019, residual Mid LCx, OM1, OM2, Diag 2, prox-mid RCA, Rt PDA moderate to severe disease  Patient has not had any recurrent angina, dyspnea symptoms.  She is currently in a weight loss program through her insurance company, which gives her daily target of number of steps.  She was taken off Jardiance by her PCP, and has caused her to have UTI.   Current Outpatient Medications on File Prior to Visit  Medication Sig Dispense Refill  . amLODipine (NORVASC) 10 MG tablet Take 1 tablet (10 mg total) by mouth daily. 30 tablet 2  . aspirin 81 MG tablet Take 81 mg by mouth every morning.     Marland Kitchen atorvastatin (LIPITOR) 40 MG tablet Take 40 mg by mouth daily.  1  . carvedilol (COREG) 25 MG tablet Take 1 tablet (25 mg total) by mouth 2 (two) times daily. 60 tablet 11  . Cholecalciferol (VITAMIN D-3) 1000 UNITS CAPS Take 1 capsule by mouth daily.     . empagliflozin (JARDIANCE) 10 MG TABS tablet Take 10 mg by mouth daily.    . Ferrous Sulfate (IRON) 325 (65 FE) MG TABS Take 1 tablet by mouth 2 (two) times daily.      . furosemide (LASIX) 40 MG tablet Take 40 mg by mouth daily.     Marland Kitchen losartan (COZAAR) 100 MG tablet Take 100 mg by mouth daily.    . metFORMIN (GLUCOPHAGE) 1000 MG tablet Take 1,000 mg by mouth 2 (two) times daily with a meal.     . nitroGLYCERIN (NITROSTAT) 0.4 MG SL tablet Place 1 tablet (0.4 mg total) under the tongue every 5 (five) minutes x 3 doses as needed for chest pain. 30 tablet 1  . ondansetron (ZOFRAN ODT) 4 MG disintegrating tablet Take 1 tablet (4 mg total) by mouth every 8 (eight) hours as needed for nausea or vomiting. 10 tablet 0  . pantoprazole  (PROTONIX) 40 MG tablet Take 1 tablet (40 mg total) by mouth daily. 30 tablet 1  . ticagrelor (BRILINTA) 60 MG TABS tablet Take 1 tablet (60 mg total) by mouth 2 (two) times daily. 60 tablet 6   No current facility-administered medications on file prior to visit.    Cardiovascular & other pertient studies:   EKG 11/24/2019: Sinus rhythm.  Nonspecific T wave abnormality.  Coronary angiography 11/17/2019: LM: Normal LAD: Diag 2 ostial 80% stenosis (unchanged since 10/2017) LCx: Ostial 40%, mid 70%, small OM 60% stenoses (unchanged since 10/2017) RCA: Prox 40% stenoses. Mid overlapping stents with no ISR. RPL focal 40% stenosis (unchanged since 10/2017)  Normal LVEDP  Impression: Unchanged coronary anatomy with patent RCA stents with no ISR. While she has moderate lesions, as described above, they are relatively unchanged over three caths in 2013, 2019, and 2021. She has not had any stable angina since RCA PCI in 2019. There is no evidence of myocardial injury on HS trop and echocardiogram. Her chest pain is unlikely to be of coronary origin. Consider noncardiac etiology.   Echocardiogram 11/16/2019: LVEF 60-65%. No significant valvular abnormality.  Recent labs: 11/24/2019: Glucose 192, BUN/Cr 24/1.0. EGFR >60. Na/K 140/4.6. Rest of the CMP  normal H/H 11/34. MCV 74.9. Platelets 349 HbA1C 8.6% Chol 106, TG 51, HDL 34, LDL 62  Results for Courtney, [name] (MRN [id number]) as of 01/15/2020 15:32  Ref. Range 11/24/2019 17:28 11/24/2019 18:00 11/24/2019 20:10  Troponin I (High Sensitivity) Latest Ref Range: <18 ng/L 3  <2     Review of Systems  Cardiovascular: Negative for chest pain, dyspnea on exertion, leg swelling, palpitations and syncope.         Vitals:   01/15/20 1533  BP: 135/69  Pulse: 77  Temp: 97.8 F (36.6 C)     Body mass index is 48.29 kg/m. Filed Weights   01/15/20 1533  Weight: 264 lb (119.7 kg)     Objective:   Physical Exam  Constitutional: She appears  well-developed and well-nourished.  Neck: No JVD present.  Cardiovascular: Normal rate, regular rhythm, normal heart sounds and intact distal pulses.  No murmur heard. Pulmonary/Chest: Effort normal and breath sounds normal. She has no wheezes. She has no rales.  Musculoskeletal:        General: No edema.  Nursing note and vitals reviewed.         Assessment & Recommendations:   54 year old African-American female with hypertension, type 2 diabetes mellitus, coronary artery disease status post mid RCA PCI 2013 and 2019, residual Mid LCx, OM1, OM2, Diag 2, prox-mid RCA, Rt PDA moderate to severe disease  CAD: No angina symptoms.  Above-stated disease has stayed stable several years apart.  No indication for revascularization at this time. Continue aspirin, statin. In absence of bleeding, continue Brilinta 60 mg twice daily till January 2021 (3 years since her ACS) Encouraged weight loss and regular physical activity.  Hypertension: Well-controlled  Type 2 diabetes mellitus: Managed by PCP.  Not on Jardiance due to side effect (UTI)  [name], MD [facility] [place] Pager: [phone] Office: [phone]

## 2020-02-05 DIAGNOSIS — I10 Essential (primary) hypertension: Secondary | ICD-10-CM | POA: Diagnosis not present

## 2020-02-05 DIAGNOSIS — I252 Old myocardial infarction: Secondary | ICD-10-CM | POA: Diagnosis not present

## 2020-02-05 DIAGNOSIS — I2510 Atherosclerotic heart disease of native coronary artery without angina pectoris: Secondary | ICD-10-CM | POA: Diagnosis not present

## 2020-02-05 DIAGNOSIS — E119 Type 2 diabetes mellitus without complications: Secondary | ICD-10-CM | POA: Diagnosis not present

## 2020-02-16 DIAGNOSIS — M25561 Pain in right knee: Secondary | ICD-10-CM | POA: Diagnosis not present

## 2020-02-16 DIAGNOSIS — I10 Essential (primary) hypertension: Secondary | ICD-10-CM | POA: Diagnosis not present

## 2020-02-16 DIAGNOSIS — I2510 Atherosclerotic heart disease of native coronary artery without angina pectoris: Secondary | ICD-10-CM | POA: Diagnosis not present

## 2020-02-16 DIAGNOSIS — E119 Type 2 diabetes mellitus without complications: Secondary | ICD-10-CM | POA: Diagnosis not present

## 2020-02-20 DIAGNOSIS — M25561 Pain in right knee: Secondary | ICD-10-CM | POA: Diagnosis not present

## 2020-02-27 DIAGNOSIS — M25561 Pain in right knee: Secondary | ICD-10-CM | POA: Diagnosis not present

## 2020-02-29 DIAGNOSIS — M25561 Pain in right knee: Secondary | ICD-10-CM | POA: Diagnosis not present

## 2020-03-29 DIAGNOSIS — I2510 Atherosclerotic heart disease of native coronary artery without angina pectoris: Secondary | ICD-10-CM | POA: Diagnosis not present

## 2020-03-29 DIAGNOSIS — E119 Type 2 diabetes mellitus without complications: Secondary | ICD-10-CM | POA: Diagnosis not present

## 2020-03-29 DIAGNOSIS — I10 Essential (primary) hypertension: Secondary | ICD-10-CM | POA: Diagnosis not present

## 2020-03-29 DIAGNOSIS — I252 Old myocardial infarction: Secondary | ICD-10-CM | POA: Diagnosis not present

## 2020-05-14 DIAGNOSIS — E119 Type 2 diabetes mellitus without complications: Secondary | ICD-10-CM | POA: Diagnosis not present

## 2020-05-14 DIAGNOSIS — H40033 Anatomical narrow angle, bilateral: Secondary | ICD-10-CM | POA: Diagnosis not present

## 2020-05-15 DIAGNOSIS — Z0110 Encounter for examination of ears and hearing without abnormal findings: Secondary | ICD-10-CM | POA: Diagnosis not present

## 2020-05-15 DIAGNOSIS — E119 Type 2 diabetes mellitus without complications: Secondary | ICD-10-CM | POA: Diagnosis not present

## 2020-05-15 DIAGNOSIS — I252 Old myocardial infarction: Secondary | ICD-10-CM | POA: Diagnosis not present

## 2020-05-15 DIAGNOSIS — Z131 Encounter for screening for diabetes mellitus: Secondary | ICD-10-CM | POA: Diagnosis not present

## 2020-05-15 DIAGNOSIS — Z0000 Encounter for general adult medical examination without abnormal findings: Secondary | ICD-10-CM | POA: Diagnosis not present

## 2020-05-15 DIAGNOSIS — I10 Essential (primary) hypertension: Secondary | ICD-10-CM | POA: Diagnosis not present

## 2020-05-15 DIAGNOSIS — I2510 Atherosclerotic heart disease of native coronary artery without angina pectoris: Secondary | ICD-10-CM | POA: Diagnosis not present

## 2020-05-15 DIAGNOSIS — Z0100 Encounter for examination of eyes and vision without abnormal findings: Secondary | ICD-10-CM | POA: Diagnosis not present

## 2020-07-18 ENCOUNTER — Ambulatory Visit: Payer: BLUE CROSS/BLUE SHIELD | Admitting: Cardiology

## 2020-08-06 NOTE — Progress Notes (Signed)
No show

## 2020-08-07 ENCOUNTER — Ambulatory Visit: Payer: BLUE CROSS/BLUE SHIELD | Admitting: Cardiology

## 2020-08-15 DIAGNOSIS — I252 Old myocardial infarction: Secondary | ICD-10-CM | POA: Diagnosis not present

## 2020-08-15 DIAGNOSIS — I10 Essential (primary) hypertension: Secondary | ICD-10-CM | POA: Diagnosis not present

## 2020-08-15 DIAGNOSIS — I2510 Atherosclerotic heart disease of native coronary artery without angina pectoris: Secondary | ICD-10-CM | POA: Diagnosis not present

## 2020-08-15 DIAGNOSIS — E119 Type 2 diabetes mellitus without complications: Secondary | ICD-10-CM | POA: Diagnosis not present

## 2020-08-15 DIAGNOSIS — Z23 Encounter for immunization: Secondary | ICD-10-CM | POA: Diagnosis not present

## 2020-08-19 ENCOUNTER — Ambulatory Visit: Payer: BLUE CROSS/BLUE SHIELD | Admitting: Cardiology

## 2020-08-19 ENCOUNTER — Encounter: Payer: Self-pay | Admitting: Cardiology

## 2020-08-19 ENCOUNTER — Other Ambulatory Visit: Payer: Self-pay

## 2020-08-19 VITALS — BP 143/88 | HR 86 | Ht 62.0 in | Wt 255.0 lb

## 2020-08-19 DIAGNOSIS — E1165 Type 2 diabetes mellitus with hyperglycemia: Secondary | ICD-10-CM | POA: Diagnosis not present

## 2020-08-19 DIAGNOSIS — I10 Essential (primary) hypertension: Secondary | ICD-10-CM

## 2020-08-19 DIAGNOSIS — I2510 Atherosclerotic heart disease of native coronary artery without angina pectoris: Secondary | ICD-10-CM | POA: Diagnosis not present

## 2020-08-19 NOTE — Progress Notes (Signed)
Follow up visit  Subjective:   [name], female    DOB: [birth year], 54 y.o.   MRN: [id number]   HPI   Chief Complaint  Patient presents with  . Coronary Artery Disease  . Follow-up    23 month    54 year old African-American female with hypertension, type 2 diabetes mellitus, coronary artery disease status post mid RCA PCI 2013 and 2019, residual Mid LCx, OM1, OM2, Diag 2, prox-mid RCA, Rt PDA moderate to severe disease  Patient denies chest pain, shortness of breath, palpitations, leg edema, orthopnea, PND, TIA/syncope. She has regular f/u w/PCP re: management of hypertension and type 2 DM.    Current Outpatient Medications on File Prior to Visit  Medication Sig Dispense Refill  . amLODipine (NORVASC) 10 MG tablet Take 1 tablet (10 mg total) by mouth daily. 30 tablet 2  . aspirin 81 MG tablet Take 81 mg by mouth every morning.     Marland Kitchen atorvastatin (LIPITOR) 40 MG tablet Take 40 mg by mouth daily.  1  . carvedilol (COREG) 25 MG tablet Take 1 tablet (25 mg total) by mouth 2 (two) times daily. 60 tablet 11  . Cholecalciferol (VITAMIN D-3) 1000 UNITS CAPS Take 1 capsule by mouth daily.     . Ferrous Sulfate (IRON) 325 (65 FE) MG TABS Take 1 tablet by mouth 2 (two) times daily.      . furosemide (LASIX) 40 MG tablet Take 40 mg by mouth daily.     Marland Kitchen losartan (COZAAR) 100 MG tablet Take 100 mg by mouth daily.    . metFORMIN (GLUCOPHAGE) 1000 MG tablet Take 1,000 mg by mouth 2 (two) times daily with a meal.     . nitroGLYCERIN (NITROSTAT) 0.4 MG SL tablet Place 1 tablet (0.4 mg total) under the tongue every 5 (five) minutes x 3 doses as needed for chest pain. 30 tablet 1  . ondansetron (ZOFRAN ODT) 4 MG disintegrating tablet Take 1 tablet (4 mg total) by mouth every 8 (eight) hours as needed for nausea or vomiting. 10 tablet 0  . pantoprazole (PROTONIX) 40 MG tablet Take 1 tablet (40 mg total) by mouth daily. 30 tablet 1  . ticagrelor (BRILINTA) 60 MG TABS tablet Take 1 tablet (60  mg total) by mouth 2 (two) times daily. 60 tablet 6   No current facility-administered medications on file prior to visit.    Cardiovascular & other pertient studies:  EKG 08/19/2020: Sinus rhythm 92 bpm  Nonspecific T-abnormality  Coronary angiography 11/17/2019: LM: Normal LAD: Diag 2 ostial 80% stenosis (unchanged since 10/2017) LCx: Ostial 40%, mid 70%, small OM 60% stenoses (unchanged since 10/2017) RCA: Prox 40% stenoses. Mid overlapping stents with no ISR. RPL focal 40% stenosis (unchanged since 10/2017)  Normal LVEDP  Impression: Unchanged coronary anatomy with patent RCA stents with no ISR. While she has moderate lesions, as described above, they are relatively unchanged over three caths in 2013, 2019, and 2021. She has not had any stable angina since RCA PCI in 2019. There is no evidence of myocardial injury on HS trop and echocardiogram. Her chest pain is unlikely to be of coronary origin. Consider noncardiac etiology.   Echocardiogram 11/16/2019: LVEF 60-65%. No significant valvular abnormality.  Recent labs: 11/24/2019: Glucose 192, BUN/Cr 24/1.0. EGFR >60. Na/K 140/4.6. Rest of the CMP normal H/H 11/34. MCV 74.9. Platelets 349 HbA1C 8.6% Chol 106, TG 51, HDL 34, LDL 62  Results for Courtney, [name] (MRN [id number]) as of 01/15/2020 15:32  Ref.  Range 11/24/2019 17:28 11/24/2019 18:00 11/24/2019 20:10  Troponin I (High Sensitivity) Latest Ref Range: <18 ng/L 3  <2     Review of Systems  Cardiovascular: Negative for chest pain, dyspnea on exertion, leg swelling, palpitations and syncope.         Vitals:   08/19/20 1541  BP: (!) 143/88  Pulse: 86  SpO2: 98%     Body mass index is 46.64 kg/m. Filed Weights   08/19/20 1541  Weight: 255 lb (115.7 kg)     Objective:   Physical Exam Vitals and nursing note reviewed.  Constitutional:      Appearance: She is well-developed.  Neck:     Vascular: No JVD.  Cardiovascular:     Rate and Rhythm: Normal rate and  regular rhythm.     Pulses: Intact distal pulses.     Heart sounds: Normal heart sounds. No murmur heard.   Pulmonary:     Effort: Pulmonary effort is normal.     Breath sounds: Normal breath sounds. No wheezing or rales.           Assessment & Recommendations:   54 year old African-American female with hypertension, type 2 diabetes mellitus, coronary artery disease status post mid RCA PCI 2013 and 2019, residual Mid LCx, OM1, OM2, Diag 2, prox-mid RCA, Rt PDA moderate to severe disease  CAD: No angina symptoms.  Above-stated disease has stayed stable several years apart.  No indication for revascularization at this time. Continue aspirin, statin.Disconitnue Brilinta Encouraged weight loss and regular physical activity.  Hypertension: Fairly well-controlled  Type 2 diabetes mellitus: Managed by PCP.  Not on Jardiance due to side effect (UTI)  F/u in 1 year  [name], MD [facility] Pager: [phone] Office: [phone]

## 2020-09-09 DIAGNOSIS — Z130 Encounter for screening for diseases of the blood and blood-forming organs and certain disorders involving the immune mechanism: Secondary | ICD-10-CM | POA: Diagnosis not present

## 2020-09-09 DIAGNOSIS — I509 Heart failure, unspecified: Secondary | ICD-10-CM | POA: Diagnosis not present

## 2020-09-09 DIAGNOSIS — Z01419 Encounter for gynecological examination (general) (routine) without abnormal findings: Secondary | ICD-10-CM | POA: Diagnosis not present

## 2020-09-09 DIAGNOSIS — Z6841 Body Mass Index (BMI) 40.0 and over, adult: Secondary | ICD-10-CM | POA: Diagnosis not present

## 2020-09-09 DIAGNOSIS — Z1231 Encounter for screening mammogram for malignant neoplasm of breast: Secondary | ICD-10-CM | POA: Diagnosis not present

## 2020-09-26 ENCOUNTER — Other Ambulatory Visit: Payer: Self-pay | Admitting: Cardiology

## 2020-10-04 ENCOUNTER — Emergency Department (HOSPITAL_COMMUNITY)
Admission: EM | Admit: 2020-10-04 | Discharge: 2020-10-04 | Disposition: A | Discharge status: Home / Self Care | Payer: BLUE CROSS/BLUE SHIELD | Attending: Emergency Medicine | Admitting: Emergency Medicine

## 2020-10-04 ENCOUNTER — Encounter (HOSPITAL_COMMUNITY): Payer: Self-pay | Admitting: Emergency Medicine

## 2020-10-04 ENCOUNTER — Other Ambulatory Visit: Payer: Self-pay

## 2020-10-04 DIAGNOSIS — M545 Low back pain, unspecified: Secondary | ICD-10-CM | POA: Insufficient documentation

## 2020-10-04 DIAGNOSIS — Z5321 Procedure and treatment not carried out due to patient leaving prior to being seen by health care provider: Secondary | ICD-10-CM | POA: Diagnosis not present
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
## 2020-10-04 NOTE — ED Notes (Signed)
Pt left without being seen.

## 2020-11-01 DIAGNOSIS — Z1152 Encounter for screening for COVID-19: Secondary | ICD-10-CM | POA: Diagnosis not present

## 2020-12-17 IMAGING — DX DG CHEST 2V
2 series · 2 of 2 positions shown · non-contrast
Comparison: 11/16/2019

CLINICAL DATA: Chest pain

EXAM:
CHEST - 2 VIEW

[chest lat]
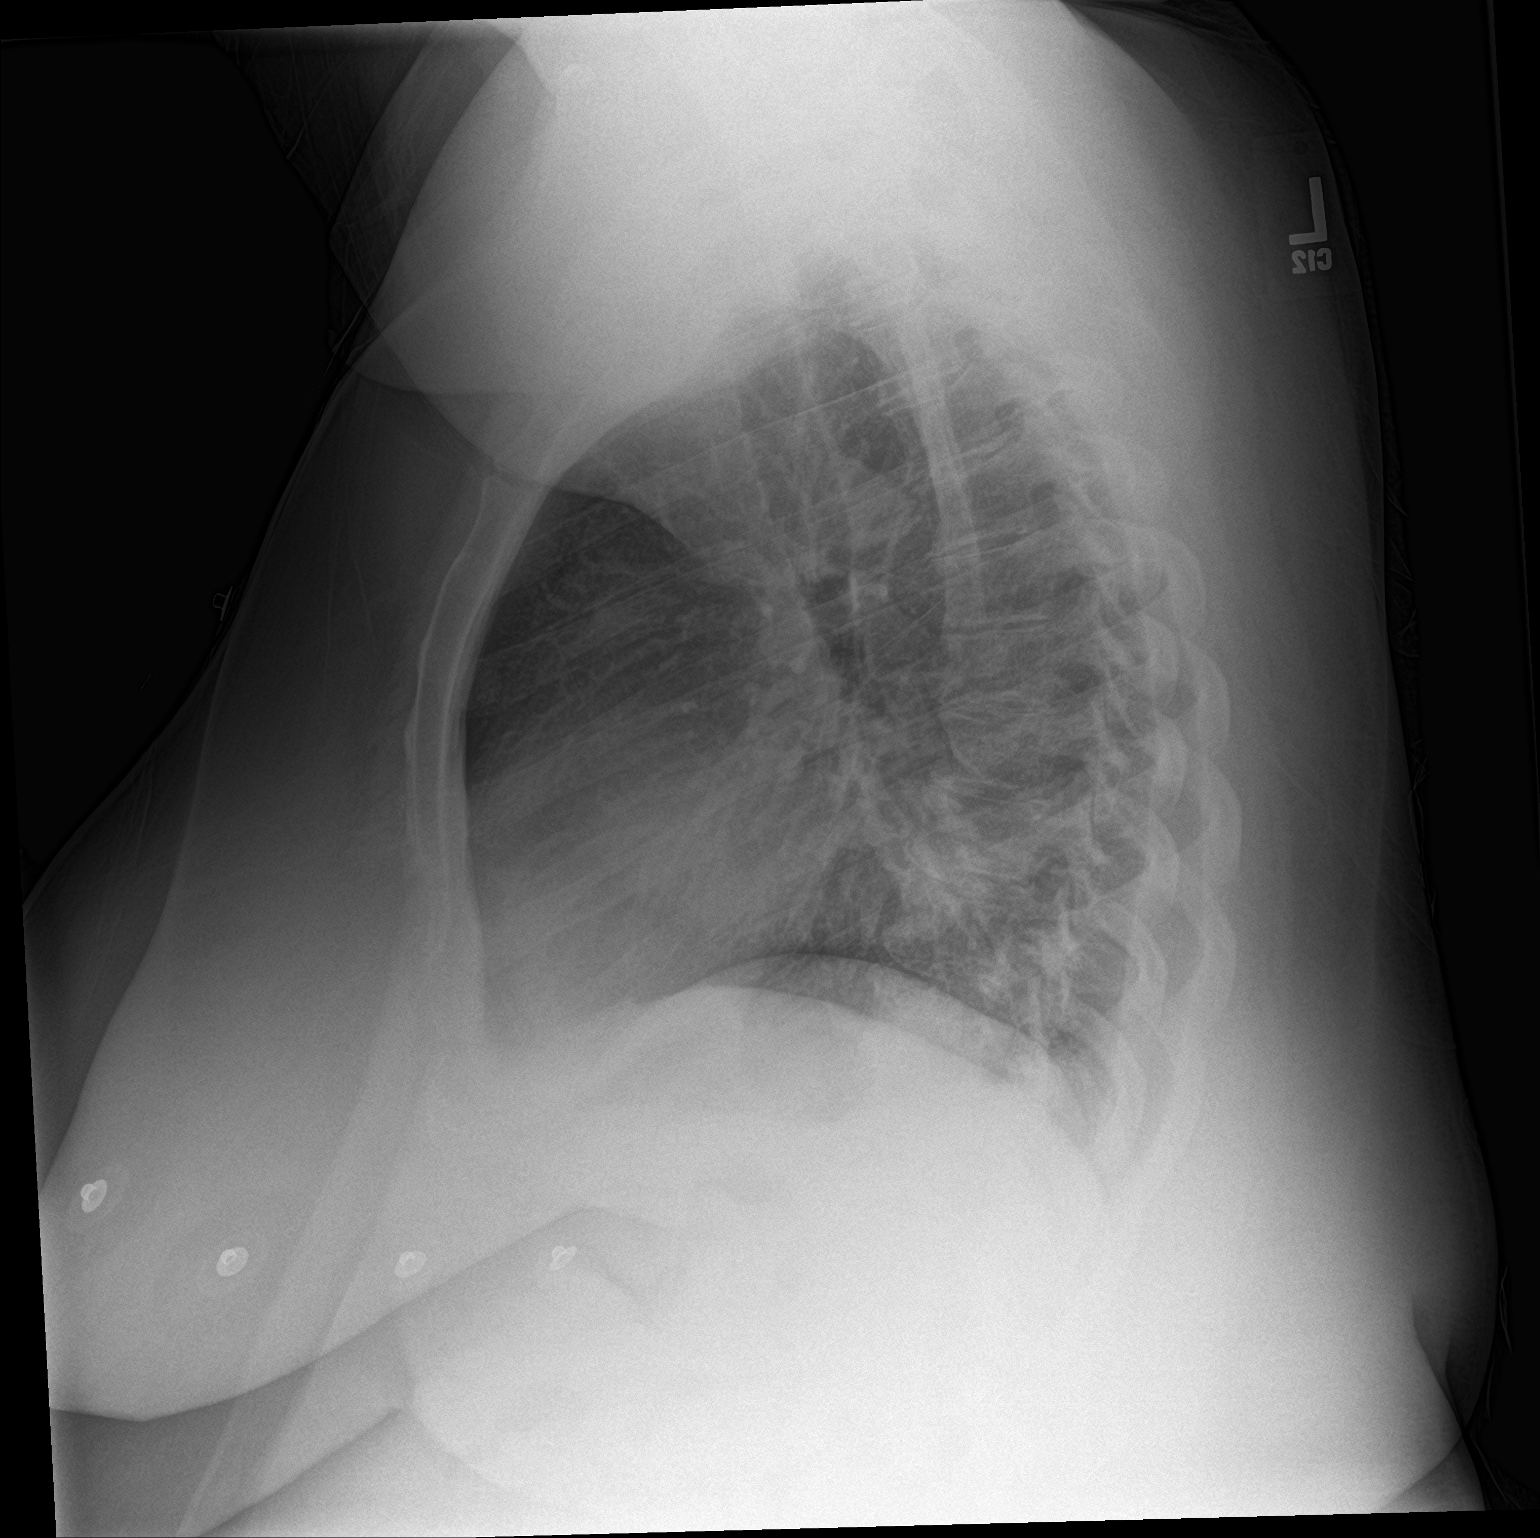

[chest pa]
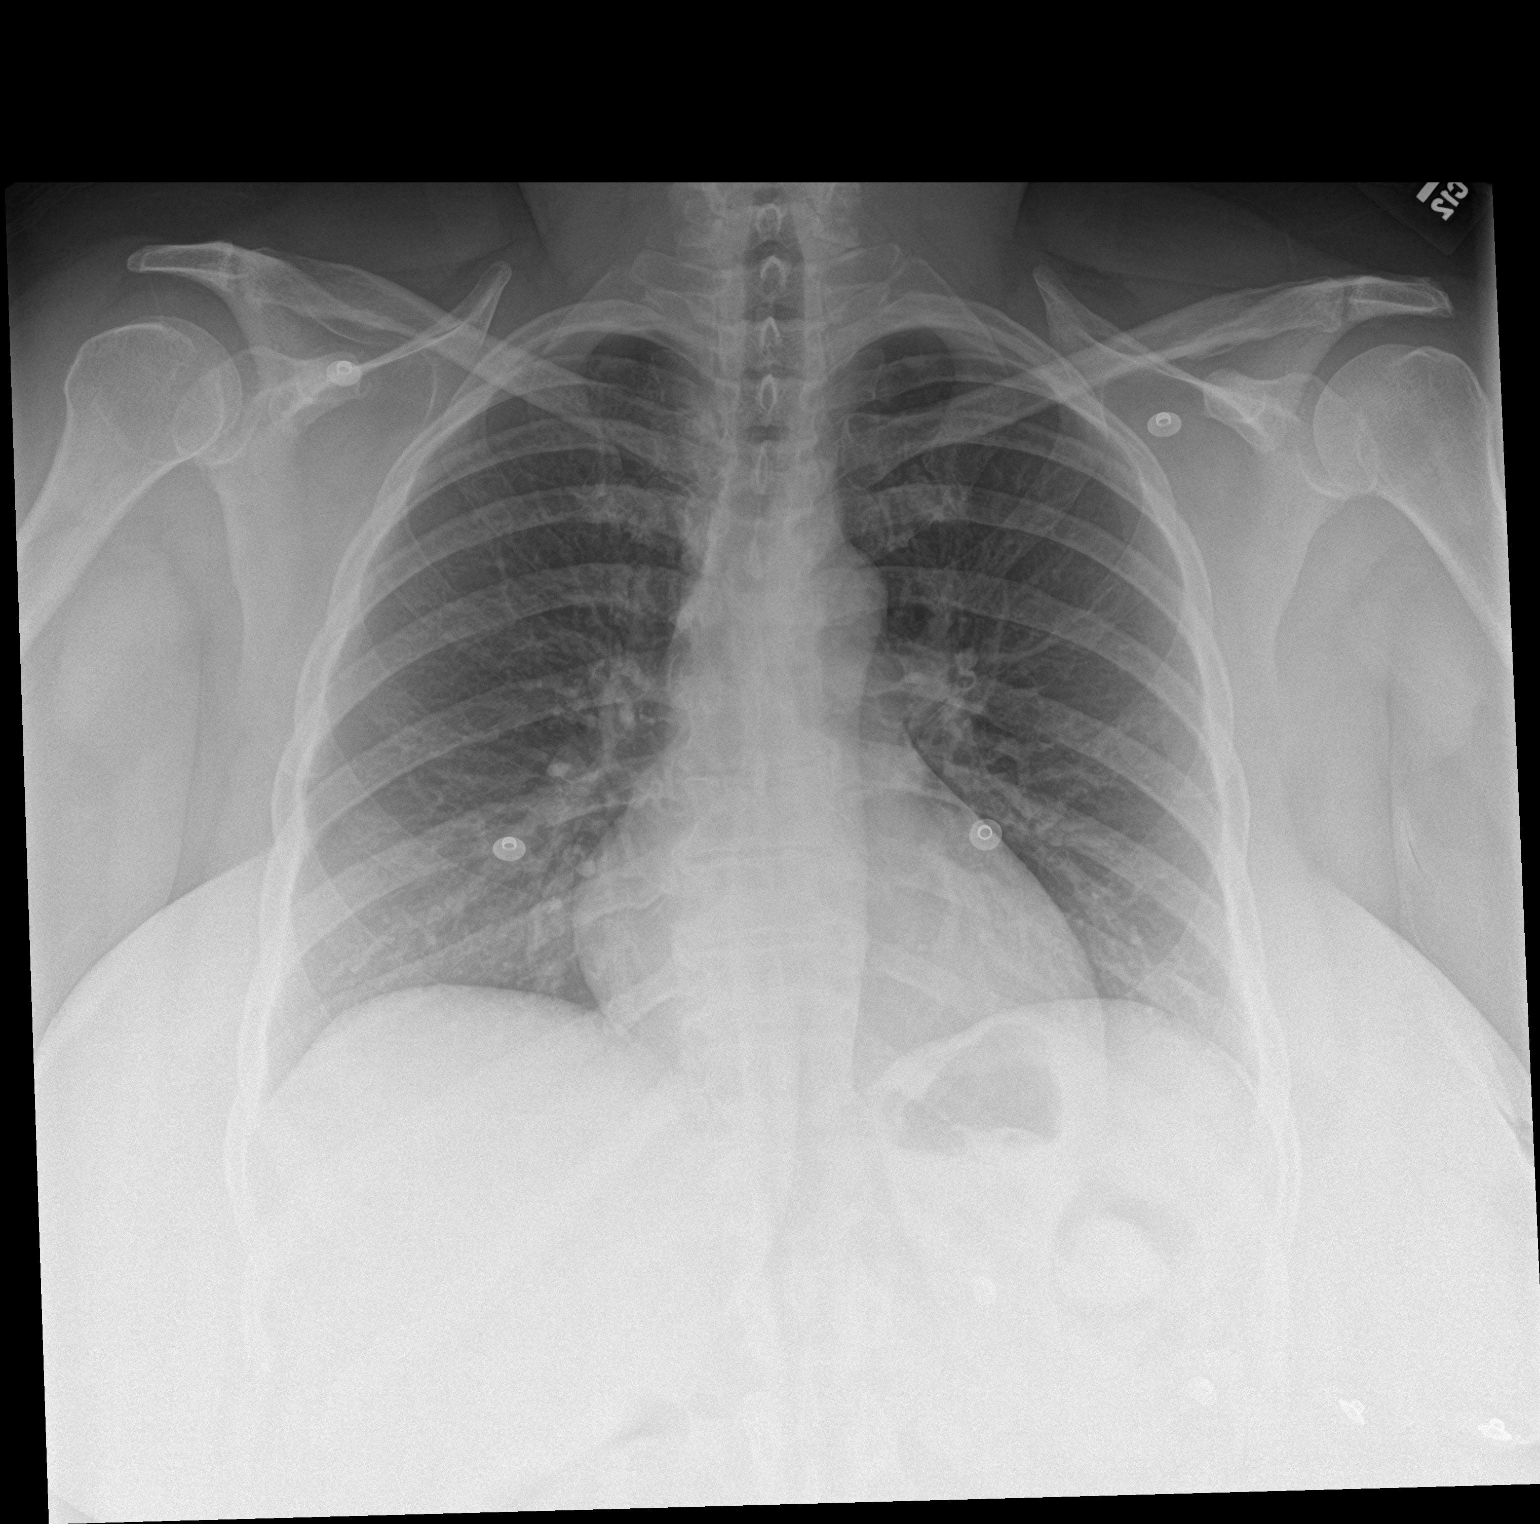

[2 of 2 positions shown; findings below may reference images not displayed]

FINDINGS: The heart size and mediastinal contours are within normal limits.
Both lungs are clear. The visualized skeletal structures are
unremarkable.
IMPRESSION: No acute abnormality of the lungs.

## 2021-03-02 ENCOUNTER — Emergency Department (HOSPITAL_COMMUNITY)
Admission: EM | Admit: 2021-03-02 | Discharge: 2021-03-02 | Disposition: A | Discharge status: Home / Self Care | Payer: BLUE CROSS/BLUE SHIELD | Attending: Emergency Medicine | Admitting: Emergency Medicine

## 2021-03-02 ENCOUNTER — Emergency Department (HOSPITAL_COMMUNITY): Payer: BLUE CROSS/BLUE SHIELD

## 2021-03-02 ENCOUNTER — Encounter (HOSPITAL_COMMUNITY): Payer: Self-pay | Admitting: Emergency Medicine

## 2021-03-02 DIAGNOSIS — E119 Type 2 diabetes mellitus without complications: Secondary | ICD-10-CM | POA: Diagnosis not present

## 2021-03-02 DIAGNOSIS — I2510 Atherosclerotic heart disease of native coronary artery without angina pectoris: Secondary | ICD-10-CM | POA: Insufficient documentation

## 2021-03-02 DIAGNOSIS — R079 Chest pain, unspecified: Secondary | ICD-10-CM | POA: Diagnosis not present

## 2021-03-02 DIAGNOSIS — Z79899 Other long term (current) drug therapy: Secondary | ICD-10-CM | POA: Insufficient documentation

## 2021-03-02 DIAGNOSIS — M542 Cervicalgia: Secondary | ICD-10-CM | POA: Insufficient documentation

## 2021-03-02 DIAGNOSIS — Z87891 Personal history of nicotine dependence: Secondary | ICD-10-CM | POA: Insufficient documentation

## 2021-03-02 DIAGNOSIS — I110 Hypertensive heart disease with heart failure: Secondary | ICD-10-CM | POA: Insufficient documentation

## 2021-03-02 DIAGNOSIS — Z7984 Long term (current) use of oral hypoglycemic drugs: Secondary | ICD-10-CM | POA: Insufficient documentation

## 2021-03-02 DIAGNOSIS — R0789 Other chest pain: Secondary | ICD-10-CM | POA: Diagnosis not present

## 2021-03-02 DIAGNOSIS — I509 Heart failure, unspecified: Secondary | ICD-10-CM | POA: Diagnosis not present

## 2021-03-02 DIAGNOSIS — R519 Headache, unspecified: Secondary | ICD-10-CM | POA: Diagnosis not present

## 2021-03-02 DIAGNOSIS — Z7982 Long term (current) use of aspirin: Secondary | ICD-10-CM | POA: Insufficient documentation

## 2021-03-02 LAB — CBC
HCT: 35.9 % — ABNORMAL LOW (ref 36.0–46.0)
Hemoglobin: 11.3 g/dL — ABNORMAL LOW (ref 12.0–15.0)
MCH: 24 pg — ABNORMAL LOW (ref 26.0–34.0)
MCHC: 31.5 g/dL (ref 30.0–36.0)
MCV: 76.2 fL — ABNORMAL LOW (ref 80.0–100.0)
Platelets: 307 10*3/uL (ref 150–400)
RBC: 4.71 MIL/uL (ref 3.87–5.11)
RDW: 13.2 % (ref 11.5–15.5)
WBC: 3.8 10*3/uL — ABNORMAL LOW (ref 4.0–10.5)
nRBC: 0 % (ref 0.0–0.2)

## 2021-03-02 LAB — BASIC METABOLIC PANEL
Anion gap: 9 (ref 5–15)
BUN: 10 mg/dL (ref 6–20)
CO2: 25 mmol/L (ref 22–32)
Calcium: 9.1 mg/dL (ref 8.9–10.3)
Chloride: 104 mmol/L (ref 98–111)
Creatinine, Ser: 0.81 mg/dL (ref 0.44–1.00)
GFR, Estimated: >60 mL/min (ref >60)
Glucose, Bld: 338 mg/dL — ABNORMAL HIGH (ref 70–99)
Potassium: 3.9 mmol/L (ref 3.5–5.1)
Sodium: 138 mmol/L (ref 135–145)

## 2021-03-02 LAB — I-STAT BETA HCG BLOOD, ED (MC, WL, AP ONLY): I-stat hCG, quantitative: <5 m[IU]/mL (ref <5)

## 2021-03-02 LAB — TROPONIN I (HIGH SENSITIVITY)
Troponin I (High Sensitivity): 3 ng/L (ref <18)
Troponin I (High Sensitivity): 6 ng/L (ref <18)

## 2021-03-02 MED ORDER — IOHEXOL 350 MG/ML SOLN
50.0000 mL | Freq: Once | INTRAVENOUS | Status: AC | PRN
  Administered 2021-03-02: 50 mL via INTRAVENOUS

## 2021-03-02 MED ORDER — KETOROLAC TROMETHAMINE 30 MG/ML IJ SOLN
30.0000 mg | Freq: Once | INTRAMUSCULAR | Status: AC
  Administered 2021-03-02: 30 mg via INTRAVENOUS
  Filled 2021-03-02: qty 1

## 2021-03-02 MED ORDER — PROCHLORPERAZINE EDISYLATE 10 MG/2ML IJ SOLN
10.0000 mg | Freq: Once | INTRAMUSCULAR | Status: AC
  Administered 2021-03-02: 10 mg via INTRAVENOUS
  Filled 2021-03-02: qty 2

## 2021-03-02 MED ORDER — DIPHENHYDRAMINE HCL 50 MG/ML IJ SOLN
25.0000 mg | Freq: Once | INTRAMUSCULAR | Status: AC
  Administered 2021-03-02: 25 mg via INTRAVENOUS
  Filled 2021-03-02: qty 1

## 2021-03-02 MED ORDER — SODIUM CHLORIDE 0.9 % IV BOLUS
1000.0000 mL | Freq: Once | INTRAVENOUS | Status: AC
  Administered 2021-03-02: 1000 mL via INTRAVENOUS

## 2021-03-02 NOTE — ED Triage Notes (Signed)
C/o L posterior headache since Friday.  States pain radiates to L neck, L shoulder, L arm, and L chest.  Reports SOB and dizziness.

## 2021-03-02 NOTE — ED Provider Notes (Signed)
Emergency Medicine Provider Triage Evaluation Note  [name] , a 55 y.o. female  was evaluated in triage.  Pt complains of headache neck pain left arm pain and chest pain  Review of Systems  Positive: headahce Negative: fever  Physical Exam  BP (!) 154/77   Pulse 89   Temp 98.4 F (36.9 C) (Oral)   Resp 18   LMP 06/16/2019   SpO2 96%  Gen:   Awake, no distress   Resp:  Normal effort  MSK:   Moves extremities without difficulty  Other:  Pain with movement of neck  Medical Decision Making  Medically screening exam initiated at 9:48 AM.  Appropriate orders placed.  AJAH VANHOOSE was informed that the remainder of the evaluation will be completed by another provider, this initial triage assessment does not replace that evaluation, and the importance of remaining in the ED until their evaluation is complete.     Fransico Meadow, [place] 03/02/21 0277    Davonna Belling, MD 03/03/21 1450

## 2021-03-02 NOTE — Discharge Instructions (Signed)
Your work-up today showed no evidence of acute stroke or any dissection in the vessels of your neck as we were concerned about.  You do have some stenosis in the vessels of your neck so please follow-up with neurology to discuss outpatient work-up.  Your exam was otherwise reassuring and your headache resolved after medications.  We feel you are now safe for discharge home.  Please rest and stay hydrated and follow-up with your primary doctor as well.  If any symptoms change or worsen acutely, please return to the nearest emergency department.

## 2021-03-02 NOTE — ED Provider Notes (Signed)
[facility]   CSN: [id number] Arrival date & time: 03/02/21  [id number]     History Chief Complaint  Patient presents with  . Headache  . Chest Pain    Courtney SellerDeborah E [name] is a 55 y.o. female.  The history is provided by the patient and medical records. No language interpreter was used.  Headache Pain location:  L parietal Quality:  Sharp and dull Radiates to:  L neck Severity currently:  8/10 Severity at highest:  9/10 Onset quality:  Sudden Duration:  3 days Timing:  Constant Progression:  Waxing and waning Chronicity:  New Similar to prior headaches: no   Context: not activity, not exposure to bright light and not caffeine   Relieved by:  Acetaminophen Worsened by:  Neck movement Ineffective treatments:  None tried Associated symptoms: neck pain   Associated symptoms: no abdominal pain, no back pain, no blurred vision, no congestion, no cough, no diarrhea, no dizziness, no fatigue, no fever, no focal weakness, no hearing loss, no loss of balance, no myalgias, no nausea, no neck stiffness, no numbness, no paresthesias, no photophobia, no seizures, no sinus pressure, no tingling, no vomiting and no weakness   Chest Pain Associated symptoms: headache   Associated symptoms: no abdominal pain, no back pain, no cough, no dizziness, no fatigue, no fever, no nausea, no numbness, no palpitations, no shortness of breath, no vomiting and no weakness        Past Medical History:  Diagnosis Date  . Anemia   . CHF (congestive heart failure) (HCC) 04/2011   EF 15-20% from echo 05/19/11  . DOE (dyspnea on exertion)   . DVT (deep venous thrombosis) (HCC) 06/2011   LLE  . Fatigue   . HTN (hypertension)   . Menorrhagia    with iron deficient anemia  . NSTEMI (non-ST elevated myocardial infarction) (HCC) 10/26/2017  . NSTEMI (non-ST elevated myocardial infarction) (HCC) 10/27/2017  . Obesity   . Orthopnea   . Type II diabetes mellitus  [facility](HCC)     Patient Active Problem List   Diagnosis Date Noted  . Morbidly obese (HCC) 08/19/2020  . Uncontrolled type 2 diabetes mellitus with hyperglycemia (HCC) 08/19/2020  . Unstable angina (HCC) 11/16/2019  . NSTEMI (non-ST elevated myocardial infarction) (HCC) 10/27/2017  . OSA (obstructive sleep apnea) 02/21/2013  . CAD (coronary artery disease), native coronary artery 11/18/2011  . Hyperlipidemia 11/18/2011  . Chest pain syndrome 11/17/2011  . Abnormal cardiovascular stress test 11/17/2011  . DVT (deep venous thrombosis) (HCC) 11/01/2011  . Essential hypertension 11/01/2011  . Anemia 11/01/2011    Past Surgical History:  Procedure Laterality Date  . CORONARY ANGIOPLASTY WITH STENT PLACEMENT  2013;  10/27/2017  . CORONARY STENT INTERVENTION N/A 10/27/2017   Procedure: CORONARY STENT INTERVENTION;  Surgeon: Elder NegusPatwardhan, Manish J, MD;  Location: [facility] INVASIVE CV LAB;  Service: Cardiovascular;  Laterality: N/A;  . LEFT HEART CATH AND CORONARY ANGIOGRAPHY N/A 10/27/2017   Procedure: LEFT HEART CATH AND CORONARY ANGIOGRAPHY;  Surgeon: Elder NegusPatwardhan, Manish J, MD;  Location: [facility] INVASIVE CV LAB;  Service: Cardiovascular;  Laterality: N/A;  . LEFT HEART CATH AND CORONARY ANGIOGRAPHY N/A 11/17/2019   Procedure: LEFT HEART CATH AND CORONARY ANGIOGRAPHY;  Surgeon: Elder NegusPatwardhan, Manish J, MD;  Location: [facility] INVASIVE CV LAB;  Service: Cardiovascular;  Laterality: N/A;  . LEFT HEART CATHETERIZATION WITH CORONARY ANGIOGRAM N/A 11/17/2011   Procedure: LEFT HEART CATHETERIZATION WITH CORONARY ANGIOGRAM;  Surgeon: Pamella PertJagadeesh R Ganji, MD;  [facility];  Service: Cardiovascular;  Laterality: N/A;  . OVARIAN CYST REMOVAL    . TUBAL LIGATION  2006  . ULTRASOUND GUIDANCE FOR VASCULAR ACCESS  10/27/2017   Procedure: Ultrasound Guidance For Vascular Access;  Surgeon: [name], MD;  Location: [place] CV LAB;  Service: Cardiovascular;;     OB History   No obstetric history on file.      Family History  Problem Relation Age of Onset  . Hypertension Mother   . Stroke Father   . Heart attack Sister   . Stroke Sister     Social History   Tobacco Use  . Smoking status: Former Smoker    Packs/day: 0.50    Years: 5.00    Pack years: 2.50    Types: Cigarettes    Quit date: 10/19/1993    Years since quitting: 27.3  . Smokeless tobacco: Never Used  Vaping Use  . Vaping Use: Never used  Substance Use Topics  . Alcohol use: No  . Drug use: No    Home Medications Prior to Admission medications   Medication Sig Start Date End Date Taking? Authorizing Provider  amLODipine (NORVASC) 10 MG tablet Take 1 tablet (10 mg total) by mouth daily. 11/22/19 08/19/20  Miquel Dunn, NP  aspirin 81 MG tablet Take 81 mg by mouth every morning.     [provider]  atorvastatin (LIPITOR) 40 MG tablet Take 40 mg by mouth daily. 12/06/16   [provider]  carvedilol (COREG) 25 MG tablet Take 1 tablet (25 mg total) by mouth 2 (two) times daily. 11/03/17 08/19/20  Patwardhan, Reynold Bowen, MD  Cholecalciferol (VITAMIN D-3) 1000 UNITS CAPS Take 1 capsule by mouth daily.     [provider]  Ferrous Sulfate (IRON) 325 (65 FE) MG TABS Take 1 tablet by mouth 2 (two) times daily.      [provider]  furosemide (LASIX) 40 MG tablet Take 40 mg by mouth daily.     [provider]  losartan (COZAAR) 100 MG tablet Take 100 mg by mouth daily. 10/21/19   [provider]  metFORMIN (GLUCOPHAGE) 1000 MG tablet Take 1,000 mg by mouth 2 (two) times daily with a meal.     [provider]  nitroGLYCERIN (NITROSTAT) 0.4 MG SL tablet Place 1 tablet (0.4 mg total) under the tongue every 5 (five) minutes x 3 doses as needed for chest pain. 10/28/17   Patwardhan, Reynold Bowen, MD  ondansetron (ZOFRAN ODT) 4 MG disintegrating tablet Take 1 tablet (4 mg total) by mouth every 8 (eight) hours as needed for nausea or vomiting. 02/07/17   Nona Dell, PA-C  pantoprazole (PROTONIX) 40 MG tablet Take 1 tablet (40 mg total) by mouth daily. 11/17/19 11/16/20  Patwardhan, Reynold Bowen, MD  TRULICITY 0.86 VH/8.4ON SOPN Inject 0.75 mg into the skin once a week. 06/26/20   [provider]    Allergies    Detrol [tolterodine tartrate]  Review of Systems   Review of Systems  Constitutional: Negative for chills, fatigue and fever.  HENT: Negative for congestion, hearing loss and sinus pressure.   Eyes: Negative for blurred vision and photophobia.  Respiratory: Negative for cough, chest tightness, shortness of breath and wheezing.   Cardiovascular: Positive for chest pain. Negative for palpitations.  Gastrointestinal: Negative for abdominal pain, diarrhea, nausea and vomiting.  Genitourinary: Negative for dysuria, flank pain and frequency.  Musculoskeletal: Positive for neck pain. Negative for back pain, myalgias and  neck stiffness.  Neurological: Positive for light-headedness and headaches. Negative for dizziness, focal weakness, seizures, speech difficulty, weakness, numbness, paresthesias and loss of balance.  Psychiatric/Behavioral: Negative for agitation and confusion.  All other systems reviewed and are negative.   Physical Exam Updated Vital Signs BP 140/84   Pulse 74   Temp 98.4 F (36.9 C) (Oral)   Resp (!) 22   LMP 06/16/2019   SpO2 98%   Physical Exam Vitals and nursing note reviewed.  Constitutional:      General: She is not in acute distress.    Appearance: Normal appearance. She is well-developed. She is not ill-appearing, toxic-appearing or diaphoretic.  HENT:     Head: Normocephalic and atraumatic.     Nose: Nose normal. No congestion or rhinorrhea.     Mouth/Throat:     Mouth: Mucous membranes are moist.     Pharynx: No oropharyngeal exudate or posterior oropharyngeal erythema.  Eyes:     Extraocular Movements: Extraocular movements intact.     Conjunctiva/sclera: Conjunctivae normal.     Pupils:  Pupils are equal, round, and reactive to light.  Neck:     Vascular: No carotid bruit.  Cardiovascular:     Rate and Rhythm: Normal rate and regular rhythm.     Heart sounds: No murmur heard.   Pulmonary:     Effort: Pulmonary effort is normal. No respiratory distress.     Breath sounds: Normal breath sounds. No wheezing, rhonchi or rales.  Chest:     Chest wall: No tenderness.  Abdominal:     General: There is no distension.     Palpations: Abdomen is soft.     Tenderness: There is no abdominal tenderness.  Musculoskeletal:        General: Tenderness present. No swelling.     Cervical back: Neck supple. Tenderness present.     Right lower leg: No edema.     Left lower leg: No edema.  Skin:    General: Skin is warm and dry.     Findings: No erythema.  Neurological:     General: No focal deficit present.     Mental Status: She is alert and oriented to person, place, and time. Mental status is at baseline.     Cranial Nerves: No cranial nerve deficit, dysarthria or facial asymmetry.     Sensory: No sensory deficit.     Motor: No weakness.     Coordination: Coordination normal.  Psychiatric:        Mood and Affect: Mood normal. Mood is not anxious.     ED Results / Procedures / Treatments   Labs (all labs ordered are listed, but only abnormal results are displayed) Labs Reviewed  BASIC METABOLIC PANEL - Abnormal; Notable for the following components:      Result Value   Glucose, Bld 338 (*)    All other components within normal limits  CBC - Abnormal; Notable for the following components:   WBC 3.8 (*)    Hemoglobin 11.3 (*)    HCT 35.9 (*)    MCV 76.2 (*)    MCH 24.0 (*)    All other components within normal limits  I-STAT BETA HCG BLOOD, ED ([facility], [facility], [facility] ONLY)  TROPONIN I (HIGH SENSITIVITY)  TROPONIN I (HIGH SENSITIVITY)    EKG EKG Interpretation  Date/Time:  Sunday Mar 02 2021 09:53:04 EDT Ventricular Rate:  80 PR Interval:  148 QRS Duration: 90 QT  Interval:  354 QTC Calculation: 408 R Axis:  37 Text Interpretation: Normal sinus rhythm Normal ECG When compared to prior, similar appearance. No STEMI Confirmed by Antony Blackbird [phone]) on 03/02/2021 11:38:44 AM   Radiology CT Angio Head W or Wo Contrast  Result Date: 03/02/2021 CLINICAL DATA:  Vertebral artery dissection suspected. Sudden onset left neck pain radiating into head with severe headache, lightheadedness. Rule out vertebral artery dissection into head. EXAM: CT ANGIOGRAPHY HEAD AND NECK TECHNIQUE: Multidetector CT imaging of the head and neck was performed using the standard protocol during bolus administration of intravenous contrast. Multiplanar CT image reconstructions and MIPs were obtained to evaluate the vascular anatomy. Carotid stenosis measurements (when applicable) are obtained utilizing NASCET criteria, using the distal internal carotid diameter as the denominator. CONTRAST:  73mL OMNIPAQUE IOHEXOL 350 MG/ML SOLN COMPARISON:  Prior head CT examinations 02/07/2017 and earlier. Brain MRI 05/20/2013. FINDINGS: CT HEAD FINDINGS Brain: Cerebral volume is normal. Mild patchy and ill-defined hypoattenuation within the cerebral white matter, nonspecific but most often secondary to chronic small vessel ischemia. There is no acute intracranial hemorrhage. No demarcated cortical infarct. No extra-axial fluid collection. No evidence of intracranial mass. No midline shift. Vascular: No hyperdense vessel.  Atherosclerotic calcifications Skull: Normal. Negative for fracture or focal lesion. Sinuses: Small left maxillary sinus mucous retention cyst. Mild left sphenoid sinus mucosal thickening. Orbits: No mass or acute finding at the imaged levels. Review of the MIP images confirms the above findings CTA NECK FINDINGS Aortic arch: Standard aortic branching. The visualized aortic arch is normal in caliber. No hemodynamically significant innominate or proximal subclavian artery stenosis. Right  carotid system: CCA and ICA patent within the neck without hemodynamically significant stenosis (50% or greater). Mild soft and calcified plaque within the carotid bifurcations/proximal ICA. No evidence of dissection. Left carotid system: CCA and ICA patent within the neck without hemodynamically significant stenosis (50% or greater). Minimal soft calcified plaque within the carotid bifurcation. No evidence of dissection. Vertebral arteries: Streak artifact from venous reflux on the right limits evaluation of the right vertebral artery at the V1/V2 junction. There is apparent at least moderate stenosis within the right vertebral artery at this level, although this could potentially be artifactual. The vertebral arteries are otherwise patent within the neck without appreciable stenosis. No specific findings to suggest dissection. Skeleton: Cervical spondylosis. No acute bony abnormality or aggressive osseous lesion. Other neck: No neck mass or cervical lymphadenopathy. Thyroid unremarkable. Upper chest: No consolidation within the imaged lung apices. Review of the MIP images confirms the above findings CTA HEAD FINDINGS Anterior circulation: The intracranial internal carotid arteries are patent. Calcified plaque within both vessels with no more than mild stenosis. The M1 middle cerebral arteries are patent. Mild/moderate stenosis within the M1 left middle cerebral artery. No M2 proximal branch occlusion or high-grade proximal stenosis is identified. The anterior cerebral arteries are patent. No intracranial aneurysm is identified. Posterior circulation: The intracranial vertebral arteries are patent. Calcified plaque with resultant severe stenoses within the V4 vertebral arteries bilaterally. The basilar artery is patent. The posterior cerebral arteries are patent. Posterior communicating arteries are present bilaterally. Venous sinuses: Within the limitations of contrast timing, no convincing thrombus. Anatomic  variants: None significant Review of the MIP images confirms the above findings IMPRESSION: CT head: 1. No evidence of acute intracranial abnormality. 2. Mild patchy and ill-defined hypoattenuation within the cerebral white matter, nonspecific but most often secondary to chronic small vessel ischemia. 3. Mild paranasal sinus disease, as described. CTA neck: 1. The bilateral common and internal carotid arteries are  patent within the neck without hemodynamically significant stenosis. Mild atherosclerotic plaque within the bilateral carotid systems within the neck, as described. No evidence of carotid artery dissection. 2. Streak artifact from venous reflux on the right limits evaluation of the right vertebral artery at the V1/V2 junction. There is an apparent at least moderate stenosis within the right vertebral artery at this level. However, this apparent stenosis could potentially be artifactual. No specific findings to suggest vertebral artery dissection. CTA head: 1. No intracranial large vessel occlusion. 2. Severe atherosclerotic stenoses within the bilateral V4 vertebral arteries. 3. Calcified plaque within the intracranial internal carotid arteries bilaterally with no more than mild stenosis. Electronically Signed   By: Jackey Loge DO   On: 03/02/2021 15:37   DG Chest 2 View  Result Date: 03/02/2021 CLINICAL DATA:  Chest pain Left posterior headache since Friday Pain radiating still left neck, shoulder, arm, chest EXAM: CHEST - 2 VIEW COMPARISON:  11/24/2019 FINDINGS: The heart size and mediastinal contours are within normal limits. Both lungs are clear. The visualized skeletal structures are unremarkable. IMPRESSION: No active cardiopulmonary disease. Electronically Signed   By: Acquanetta Belling M.D.   On: 03/02/2021 10:18   CT Angio Neck W and/or Wo Contrast  Result Date: 03/02/2021 CLINICAL DATA:  Vertebral artery dissection suspected. Sudden onset left neck pain radiating into head with severe  headache, lightheadedness. Rule out vertebral artery dissection into head. EXAM: CT ANGIOGRAPHY HEAD AND NECK TECHNIQUE: Multidetector CT imaging of the head and neck was performed using the standard protocol during bolus administration of intravenous contrast. Multiplanar CT image reconstructions and MIPs were obtained to evaluate the vascular anatomy. Carotid stenosis measurements (when applicable) are obtained utilizing NASCET criteria, using the distal internal carotid diameter as the denominator. CONTRAST:  12mL OMNIPAQUE IOHEXOL 350 MG/ML SOLN COMPARISON:  Prior head CT examinations 02/07/2017 and earlier. Brain MRI 05/20/2013. FINDINGS: CT HEAD FINDINGS Brain: Cerebral volume is normal. Mild patchy and ill-defined hypoattenuation within the cerebral white matter, nonspecific but most often secondary to chronic small vessel ischemia. There is no acute intracranial hemorrhage. No demarcated cortical infarct. No extra-axial fluid collection. No evidence of intracranial mass. No midline shift. Vascular: No hyperdense vessel.  Atherosclerotic calcifications Skull: Normal. Negative for fracture or focal lesion. Sinuses: Small left maxillary sinus mucous retention cyst. Mild left sphenoid sinus mucosal thickening. Orbits: No mass or acute finding at the imaged levels. Review of the MIP images confirms the above findings CTA NECK FINDINGS Aortic arch: Standard aortic branching. The visualized aortic arch is normal in caliber. No hemodynamically significant innominate or proximal subclavian artery stenosis. Right carotid system: CCA and ICA patent within the neck without hemodynamically significant stenosis (50% or greater). Mild soft and calcified plaque within the carotid bifurcations/proximal ICA. No evidence of dissection. Left carotid system: CCA and ICA patent within the neck without hemodynamically significant stenosis (50% or greater). Minimal soft calcified plaque within the carotid bifurcation. No evidence  of dissection. Vertebral arteries: Streak artifact from venous reflux on the right limits evaluation of the right vertebral artery at the V1/V2 junction. There is apparent at least moderate stenosis within the right vertebral artery at this level, although this could potentially be artifactual. The vertebral arteries are otherwise patent within the neck without appreciable stenosis. No specific findings to suggest dissection. Skeleton: Cervical spondylosis. No acute bony abnormality or aggressive osseous lesion. Other neck: No neck mass or cervical lymphadenopathy. Thyroid unremarkable. Upper chest: No consolidation within the imaged lung apices. Review of the  MIP images confirms the above findings CTA HEAD FINDINGS Anterior circulation: The intracranial internal carotid arteries are patent. Calcified plaque within both vessels with no more than mild stenosis. The M1 middle cerebral arteries are patent. Mild/moderate stenosis within the M1 left middle cerebral artery. No M2 proximal branch occlusion or high-grade proximal stenosis is identified. The anterior cerebral arteries are patent. No intracranial aneurysm is identified. Posterior circulation: The intracranial vertebral arteries are patent. Calcified plaque with resultant severe stenoses within the V4 vertebral arteries bilaterally. The basilar artery is patent. The posterior cerebral arteries are patent. Posterior communicating arteries are present bilaterally. Venous sinuses: Within the limitations of contrast timing, no convincing thrombus. Anatomic variants: None significant Review of the MIP images confirms the above findings IMPRESSION: CT head: 1. No evidence of acute intracranial abnormality. 2. Mild patchy and ill-defined hypoattenuation within the cerebral white matter, nonspecific but most often secondary to chronic small vessel ischemia. 3. Mild paranasal sinus disease, as described. CTA neck: 1. The bilateral common and internal carotid arteries  are patent within the neck without hemodynamically significant stenosis. Mild atherosclerotic plaque within the bilateral carotid systems within the neck, as described. No evidence of carotid artery dissection. 2. Streak artifact from venous reflux on the right limits evaluation of the right vertebral artery at the V1/V2 junction. There is an apparent at least moderate stenosis within the right vertebral artery at this level. However, this apparent stenosis could potentially be artifactual. No specific findings to suggest vertebral artery dissection. CTA head: 1. No intracranial large vessel occlusion. 2. Severe atherosclerotic stenoses within the bilateral V4 vertebral arteries. 3. Calcified plaque within the intracranial internal carotid arteries bilaterally with no more than mild stenosis. Electronically Signed   By: Kellie Simmering DO   On: 03/02/2021 15:37    Procedures Procedures   Medications Ordered in ED Medications  sodium chloride 0.9 % bolus 1,000 mL (0 mLs Intravenous Stopped 03/02/21 1811)  prochlorperazine (COMPAZINE) injection 10 mg (10 mg Intravenous Given 03/02/21 1435)  diphenhydrAMINE (BENADRYL) injection 25 mg (25 mg Intravenous Given 03/02/21 1436)  ketorolac (TORADOL) 30 MG/ML injection 30 mg (30 mg Intravenous Given 03/02/21 1438)  iohexol (OMNIPAQUE) 350 MG/ML injection 50 mL (50 mLs Intravenous Contrast Given 03/02/21 1512)    ED Course  I have reviewed the triage vital signs and the nursing notes.  Pertinent labs & imaging results that were available during my care of the patient were reviewed by me and considered in my medical decision making (see chart for details).    MDM Rules/Calculators/A&P                          SHUNTEL FISHBURN is a 55 y.o. female with a past medical history significant for hypertension, hyperlipidemia, diabetes, CAD with previous NSTEMI, prior DVT not on anticoagulation, CHF, and sleep apnea who presents with new headache.  Patient reports that 3  days ago, she had sudden onset of pain in her left neck rating up into her head.  She reports it has been severe and waxes and wanes.  She denies vision changes, nausea, or vomiting.  She denies any focal neurologic deficits but she does report feeling lightheaded.  She reports the pain is so severe it seemed to radiate towards her chest with some shortness of breath.  She denies any actual syncope.  Denies any other complaints.  She has tried taking Tylenol which only helped slightly.  She is not wanting gets headaches  frequently and denies any trauma.  Denies any chiropractor use, neck injury, neck twisting, or neck massages.  She does report that it hurts when she turns her neck horizontally in that area but not as much when she flexes and extends it.  No fevers or chills reported.  On exam, lungs clear and chest nontender.  Abdomen nontender.  Good pulses in extremities.  Left lateral neck is tender to palpation with some possible spasm.  She had worsened pain with neck movement.  Did not appreciate a carotid bruit.  Pupils symmetric and reactive normal extraocular movements.  Normal finger-nose-finger testing normal sensation and strength throughout.   Clinically I suspect she is having a Atypical migraine or tension type headache that radiates down to her neck with the spasms however, given the onset being in the neck going into the head and the pain with neck movement, I do feel we need to get a CTA to look for vertebral artery dissection going into her head.  We will also treat as a normal headache with a headache cocktail and she will also have some screening labs including a troponin, EKG, chest x-ray due to the chest component given her cardiac history.  EKG shows no STEMI.  Her initial troponin is negative.  Metabolic panel reassuring and CBC shows mild leukocytosis and mild anemia similar to prior.  Chest x-ray shows no pneumonia.  Will reassess patient after medications for headache and CT  imaging of her head and neck.  Anticipate discharge if work-up is reassuring.  CTA of the head and neck do not show any evidence of acute stroke or any dissection.  Patient does have some arterial stenosis which we discussed but no critical flow abnormality seen.  No acute stroke seen.  Patient has some chronic changes seen which we discussed.  Patient is feeling significant better on reassessment reports termination of her headache.  Given her well appearance, will discharge home but I will give her instructions to follow-up with neurology given the carotid disease.  Patient agrees with plan of care and follow instructions.  She no other questions or concerns and was discharged in good condition.   Final Clinical Impression(s) / ED Diagnoses Final diagnoses:  Bad headache  Neck pain on left side    Rx / DC Orders ED Discharge Orders    None     Clinical Impression: 1. Bad headache   2. Neck pain on left side     Disposition: Discharge  Condition: Good  I have discussed the results, Dx and Tx plan with the pt(& family if present). He/she/they expressed understanding and agree(s) with the plan. Discharge instructions discussed at great length. Strict return precautions discussed and pt &/or family have verbalized understanding of the instructions. No further questions at time of discharge.    New Prescriptions   No medications on file    Follow Up: Trey Sailors, PA [address] [address] [id number]     [facility] [address]     [address] [place] [address] [place] [address] [id number] mc [place]       Ninoska Goswick, Gwenyth Allegra, MD 03/02/21 [phone]

## 2021-03-02 NOTE — ED Notes (Signed)
Pt ambulatory with steady gait to bathroom

## 2021-05-01 DIAGNOSIS — Z0001 Encounter for general adult medical examination with abnormal findings: Secondary | ICD-10-CM | POA: Diagnosis not present

## 2021-05-01 DIAGNOSIS — E559 Vitamin D deficiency, unspecified: Secondary | ICD-10-CM | POA: Diagnosis not present

## 2021-05-01 DIAGNOSIS — E782 Mixed hyperlipidemia: Secondary | ICD-10-CM | POA: Diagnosis not present

## 2021-05-01 DIAGNOSIS — E1165 Type 2 diabetes mellitus with hyperglycemia: Secondary | ICD-10-CM | POA: Diagnosis not present

## 2021-05-01 DIAGNOSIS — I10 Essential (primary) hypertension: Secondary | ICD-10-CM | POA: Diagnosis not present

## 2021-08-20 ENCOUNTER — Ambulatory Visit: Payer: BLUE CROSS/BLUE SHIELD | Admitting: Cardiology

## 2022-03-10 ENCOUNTER — Encounter (HOSPITAL_COMMUNITY): Payer: Self-pay

## 2022-03-10 ENCOUNTER — Emergency Department (HOSPITAL_COMMUNITY): Payer: Medicaid Other

## 2022-03-10 ENCOUNTER — Inpatient Hospital Stay (HOSPITAL_COMMUNITY)
Admission: EM | Admit: 2022-03-10 | Discharge: 2022-03-12 | DRG: 246 | Disposition: A | Discharge status: Home / Self Care | Payer: Medicaid Other | Attending: Family Medicine | Admitting: Family Medicine

## 2022-03-10 ENCOUNTER — Inpatient Hospital Stay (HOSPITAL_COMMUNITY): Payer: Medicaid Other

## 2022-03-10 ENCOUNTER — Other Ambulatory Visit (HOSPITAL_COMMUNITY): Payer: Self-pay

## 2022-03-10 ENCOUNTER — Other Ambulatory Visit: Payer: Self-pay

## 2022-03-10 ENCOUNTER — Encounter (HOSPITAL_COMMUNITY)
Admission: EM | Disposition: A | Discharge status: Home / Self Care | Payer: Self-pay | Source: Home / Self Care | Attending: Family Medicine

## 2022-03-10 DIAGNOSIS — Z23 Encounter for immunization: Secondary | ICD-10-CM

## 2022-03-10 DIAGNOSIS — Y831 Surgical operation with implant of artificial internal device as the cause of abnormal reaction of the patient, or of later complication, without mention of misadventure at the time of the procedure: Secondary | ICD-10-CM | POA: Diagnosis present

## 2022-03-10 DIAGNOSIS — I214 Non-ST elevation (NSTEMI) myocardial infarction: Secondary | ICD-10-CM | POA: Diagnosis present

## 2022-03-10 DIAGNOSIS — Z7982 Long term (current) use of aspirin: Secondary | ICD-10-CM | POA: Diagnosis not present

## 2022-03-10 DIAGNOSIS — Z86718 Personal history of other venous thrombosis and embolism: Secondary | ICD-10-CM | POA: Diagnosis not present

## 2022-03-10 DIAGNOSIS — T82855A Stenosis of coronary artery stent, initial encounter: Principal | ICD-10-CM | POA: Diagnosis present

## 2022-03-10 DIAGNOSIS — Z7984 Long term (current) use of oral hypoglycemic drugs: Secondary | ICD-10-CM

## 2022-03-10 DIAGNOSIS — Z79899 Other long term (current) drug therapy: Secondary | ICD-10-CM

## 2022-03-10 DIAGNOSIS — Z87891 Personal history of nicotine dependence: Secondary | ICD-10-CM

## 2022-03-10 DIAGNOSIS — Z955 Presence of coronary angioplasty implant and graft: Secondary | ICD-10-CM

## 2022-03-10 DIAGNOSIS — I252 Old myocardial infarction: Secondary | ICD-10-CM

## 2022-03-10 DIAGNOSIS — Z538 Procedure and treatment not carried out for other reasons: Secondary | ICD-10-CM | POA: Diagnosis present

## 2022-03-10 DIAGNOSIS — Z6841 Body Mass Index (BMI) 40.0 and over, adult: Secondary | ICD-10-CM | POA: Diagnosis not present

## 2022-03-10 DIAGNOSIS — D509 Iron deficiency anemia, unspecified: Secondary | ICD-10-CM | POA: Diagnosis present

## 2022-03-10 DIAGNOSIS — I200 Unstable angina: Secondary | ICD-10-CM | POA: Diagnosis present

## 2022-03-10 DIAGNOSIS — E1165 Type 2 diabetes mellitus with hyperglycemia: Secondary | ICD-10-CM | POA: Diagnosis present

## 2022-03-10 DIAGNOSIS — R072 Precordial pain: Principal | ICD-10-CM

## 2022-03-10 DIAGNOSIS — Z8249 Family history of ischemic heart disease and other diseases of the circulatory system: Secondary | ICD-10-CM | POA: Diagnosis not present

## 2022-03-10 DIAGNOSIS — E7800 Pure hypercholesterolemia, unspecified: Secondary | ICD-10-CM | POA: Diagnosis present

## 2022-03-10 DIAGNOSIS — I110 Hypertensive heart disease with heart failure: Secondary | ICD-10-CM | POA: Diagnosis present

## 2022-03-10 DIAGNOSIS — I25110 Atherosclerotic heart disease of native coronary artery with unstable angina pectoris: Secondary | ICD-10-CM | POA: Diagnosis present

## 2022-03-10 DIAGNOSIS — Z888 Allergy status to other drugs, medicaments and biological substances status: Secondary | ICD-10-CM | POA: Diagnosis not present

## 2022-03-10 DIAGNOSIS — I10 Essential (primary) hypertension: Secondary | ICD-10-CM | POA: Diagnosis present

## 2022-03-10 DIAGNOSIS — I5032 Chronic diastolic (congestive) heart failure: Secondary | ICD-10-CM | POA: Diagnosis present

## 2022-03-10 DIAGNOSIS — E119 Type 2 diabetes mellitus without complications: Secondary | ICD-10-CM

## 2022-03-10 HISTORY — PX: CORONARY STENT INTERVENTION: CATH118234

## 2022-03-10 HISTORY — DX: Atherosclerotic heart disease of native coronary artery without angina pectoris: I25.10

## 2022-03-10 HISTORY — PX: LEFT HEART CATH AND CORONARY ANGIOGRAPHY: CATH118249

## 2022-03-10 LAB — HEMOGLOBIN A1C
Hgb A1c MFr Bld: 8.7 % — ABNORMAL HIGH (ref 4.8–5.6)
Mean Plasma Glucose: 202.99 mg/dL

## 2022-03-10 LAB — BASIC METABOLIC PANEL
Anion gap: 8 (ref 5–15)
BUN: 15 mg/dL (ref 6–20)
CO2: 24 mmol/L (ref 22–32)
Calcium: 8.8 mg/dL — ABNORMAL LOW (ref 8.9–10.3)
Chloride: 107 mmol/L (ref 98–111)
Creatinine, Ser: 0.95 mg/dL (ref 0.44–1.00)
GFR, Estimated: >60 mL/min (ref >60)
Glucose, Bld: 290 mg/dL — ABNORMAL HIGH (ref 70–99)
Potassium: 4 mmol/L (ref 3.5–5.1)
Sodium: 139 mmol/L (ref 135–145)

## 2022-03-10 LAB — CBG MONITORING, ED: Glucose-Capillary: 190 mg/dL — ABNORMAL HIGH (ref 70–99)

## 2022-03-10 LAB — MAGNESIUM: Magnesium: 2 mg/dL (ref 1.7–2.4)

## 2022-03-10 LAB — CBC
HCT: 29.7 % — ABNORMAL LOW (ref 36.0–46.0)
Hemoglobin: 9.8 g/dL — ABNORMAL LOW (ref 12.0–15.0)
MCH: 24.5 pg — ABNORMAL LOW (ref 26.0–34.0)
MCHC: 33 g/dL (ref 30.0–36.0)
MCV: 74.3 fL — ABNORMAL LOW (ref 80.0–100.0)
Platelets: 277 10*3/uL (ref 150–400)
RBC: 4 MIL/uL (ref 3.87–5.11)
RDW: 13.7 % (ref 11.5–15.5)
WBC: 5.7 10*3/uL (ref 4.0–10.5)
nRBC: 0 % (ref 0.0–0.2)

## 2022-03-10 LAB — POCT ACTIVATED CLOTTING TIME
Activated Clotting Time: 305 seconds
Activated Clotting Time: 245 seconds

## 2022-03-10 LAB — PROCALCITONIN: Procalcitonin: <0.1 ng/mL

## 2022-03-10 LAB — PROTIME-INR
INR: 1 (ref 0.8–1.2)
Prothrombin Time: 12.7 seconds (ref 11.4–15.2)

## 2022-03-10 LAB — I-STAT BETA HCG BLOOD, ED (MC, WL, AP ONLY): I-stat hCG, quantitative: <5 m[IU]/mL (ref <5)

## 2022-03-10 LAB — GLUCOSE, CAPILLARY
Glucose-Capillary: 205 mg/dL — ABNORMAL HIGH (ref 70–99)
Glucose-Capillary: 182 mg/dL — ABNORMAL HIGH (ref 70–99)

## 2022-03-10 LAB — TROPONIN I (HIGH SENSITIVITY)
Troponin I (High Sensitivity): 15 ng/L (ref <18)
Troponin I (High Sensitivity): 28 ng/L — ABNORMAL HIGH (ref <18)
Troponin I (High Sensitivity): 67 ng/L — ABNORMAL HIGH (ref <18)

## 2022-03-10 SURGERY — LEFT HEART CATH AND CORONARY ANGIOGRAPHY
Anesthesia: LOCAL

## 2022-03-10 MED ORDER — LIDOCAINE HCL (PF) 1 % IJ SOLN
INTRAMUSCULAR | Status: AC
  Filled 2022-03-10: qty 30

## 2022-03-10 MED ORDER — ASPIRIN 81 MG PO CHEW
81.0000 mg | CHEWABLE_TABLET | ORAL | Status: AC
  Administered 2022-03-11: 81 mg via ORAL
  Filled 2022-03-10: qty 1

## 2022-03-10 MED ORDER — SODIUM CHLORIDE 0.9 % IV SOLN: 250.0000 mL | INTRAVENOUS | Status: DC | PRN

## 2022-03-10 MED ORDER — VERAPAMIL HCL 2.5 MG/ML IV SOLN
INTRAVENOUS | Status: DC | PRN
  Administered 2022-03-10: 10 mL via INTRA_ARTERIAL

## 2022-03-10 MED ORDER — NITROGLYCERIN 1 MG/10 ML FOR IR/CATH LAB
INTRA_ARTERIAL | Status: AC
  Filled 2022-03-10: qty 10

## 2022-03-10 MED ORDER — HEPARIN (PORCINE) IN NACL 1000-0.9 UT/500ML-% IV SOLN
INTRAVENOUS | Status: DC | PRN
Start: 2022-03-10 — End: 2022-03-10
  Administered 2022-03-10 (×2): 500 mL

## 2022-03-10 MED ORDER — SODIUM CHLORIDE 0.9 % WEIGHT BASED INFUSION: 1.0000 mL/kg/h | INTRAVENOUS | Status: DC

## 2022-03-10 MED ORDER — FENTANYL CITRATE (PF) 100 MCG/2ML IJ SOLN
INTRAMUSCULAR | Status: DC | PRN
  Administered 2022-03-10 (×2): 25 ug via INTRAVENOUS

## 2022-03-10 MED ORDER — SODIUM CHLORIDE 0.9 % WEIGHT BASED INFUSION: 3.0000 mL/kg/h | INTRAVENOUS | Status: DC

## 2022-03-10 MED ORDER — INSULIN ASPART 100 UNIT/ML IJ SOLN
0.0000 [IU] | Freq: Four times a day (QID) | INTRAMUSCULAR | Status: DC
  Administered 2022-03-10: 1 [IU] via SUBCUTANEOUS
  Administered 2022-03-10: 2 [IU] via SUBCUTANEOUS
  Administered 2022-03-11 – 2022-03-12 (×3): 1 [IU] via SUBCUTANEOUS

## 2022-03-10 MED ORDER — SODIUM CHLORIDE 0.9% FLUSH: 3.0000 mL | Freq: Two times a day (BID) | INTRAVENOUS | Status: DC

## 2022-03-10 MED ORDER — HEPARIN SODIUM (PORCINE) 1000 UNIT/ML IJ SOLN
INTRAMUSCULAR | Status: AC
  Filled 2022-03-10: qty 10

## 2022-03-10 MED ORDER — SODIUM CHLORIDE 0.9 % WEIGHT BASED INFUSION
1.0000 mL/kg/h | INTRAVENOUS | Status: DC
  Administered 2022-03-10: 1 mL/kg/h via INTRAVENOUS

## 2022-03-10 MED ORDER — PNEUMOCOCCAL 20-VAL CONJ VACC 0.5 ML IM SUSY
0.5000 mL | PREFILLED_SYRINGE | INTRAMUSCULAR | Status: AC
  Administered 2022-03-11: 0.5 mL via INTRAMUSCULAR
  Filled 2022-03-10 (×2): qty 0.5

## 2022-03-10 MED ORDER — FAMOTIDINE IN NACL 20-0.9 MG/50ML-% IV SOLN
INTRAVENOUS | Status: AC | PRN
  Administered 2022-03-10: 20 mg via INTRAVENOUS

## 2022-03-10 MED ORDER — ASPIRIN 81 MG PO CHEW
81.0000 mg | CHEWABLE_TABLET | ORAL | Status: AC
  Administered 2022-03-10: 81 mg via ORAL
  Filled 2022-03-10: qty 1

## 2022-03-10 MED ORDER — IOHEXOL 350 MG/ML SOLN
INTRAVENOUS | Status: DC | PRN
  Administered 2022-03-10: 175 mL

## 2022-03-10 MED ORDER — SODIUM CHLORIDE 0.9 % WEIGHT BASED INFUSION
3.0000 mL/kg/h | INTRAVENOUS | Status: AC
  Administered 2022-03-11: 3 mL/kg/h via INTRAVENOUS

## 2022-03-10 MED ORDER — SODIUM CHLORIDE 0.9% FLUSH: 3.0000 mL | INTRAVENOUS | Status: DC | PRN

## 2022-03-10 MED ORDER — MIDAZOLAM HCL 2 MG/2ML IJ SOLN
INTRAMUSCULAR | Status: AC
  Filled 2022-03-10: qty 2

## 2022-03-10 MED ORDER — HYDRALAZINE HCL 20 MG/ML IJ SOLN
5.0000 mg | INTRAMUSCULAR | Status: AC | PRN
  Administered 2022-03-10: 5 mg via INTRAVENOUS
  Filled 2022-03-10: qty 1

## 2022-03-10 MED ORDER — MIDAZOLAM HCL 2 MG/2ML IJ SOLN
INTRAMUSCULAR | Status: DC | PRN
  Administered 2022-03-10: 2 mg via INTRAVENOUS
  Administered 2022-03-10: 1 mg via INTRAVENOUS

## 2022-03-10 MED ORDER — ONDANSETRON HCL 4 MG/2ML IJ SOLN
INTRAMUSCULAR | Status: DC | PRN
  Administered 2022-03-10: 4 mg via INTRAVENOUS

## 2022-03-10 MED ORDER — HYDRALAZINE HCL 20 MG/ML IJ SOLN
INTRAMUSCULAR | Status: AC
  Filled 2022-03-10: qty 1

## 2022-03-10 MED ORDER — TICAGRELOR 90 MG PO TABS
ORAL_TABLET | ORAL | Status: AC
  Filled 2022-03-10: qty 2

## 2022-03-10 MED ORDER — PERFLUTREN LIPID MICROSPHERE
1.0000 mL | INTRAVENOUS | Status: AC | PRN
  Administered 2022-03-10: 3 mL via INTRAVENOUS

## 2022-03-10 MED ORDER — LIDOCAINE HCL (PF) 1 % IJ SOLN
INTRAMUSCULAR | Status: DC | PRN
  Administered 2022-03-10: 2 mL

## 2022-03-10 MED ORDER — TICAGRELOR 90 MG PO TABS
ORAL_TABLET | ORAL | Status: DC | PRN
  Administered 2022-03-10: 180 mg via ORAL

## 2022-03-10 MED ORDER — HEPARIN (PORCINE) 25000 UT/250ML-% IV SOLN
1200.0000 [IU]/h | INTRAVENOUS | Status: DC
  Administered 2022-03-10: 1200 [IU]/h via INTRAVENOUS
  Filled 2022-03-10: qty 250

## 2022-03-10 MED ORDER — FAMOTIDINE IN NACL 20-0.9 MG/50ML-% IV SOLN
INTRAVENOUS | Status: AC
  Filled 2022-03-10: qty 50

## 2022-03-10 MED ORDER — TICAGRELOR 90 MG PO TABS
90.0000 mg | ORAL_TABLET | Freq: Two times a day (BID) | ORAL | Status: DC
  Administered 2022-03-10 – 2022-03-12 (×4): 90 mg via ORAL
  Filled 2022-03-10 (×4): qty 1

## 2022-03-10 MED ORDER — FENTANYL CITRATE (PF) 100 MCG/2ML IJ SOLN
INTRAMUSCULAR | Status: AC
Start: 2022-03-10 — End: ?
  Filled 2022-03-10: qty 2

## 2022-03-10 MED ORDER — VERAPAMIL HCL 2.5 MG/ML IV SOLN
INTRAVENOUS | Status: AC
  Filled 2022-03-10: qty 2

## 2022-03-10 MED ORDER — SODIUM CHLORIDE 0.9% FLUSH
3.0000 mL | Freq: Two times a day (BID) | INTRAVENOUS | Status: DC
  Administered 2022-03-11: 3 mL via INTRAVENOUS

## 2022-03-10 MED ORDER — ONDANSETRON HCL 4 MG/2ML IJ SOLN
INTRAMUSCULAR | Status: AC
  Filled 2022-03-10: qty 2

## 2022-03-10 MED ORDER — HEPARIN SODIUM (PORCINE) 1000 UNIT/ML IJ SOLN
INTRAMUSCULAR | Status: DC | PRN
  Administered 2022-03-10: 4000 [IU] via INTRAVENOUS
  Administered 2022-03-10: 6000 [IU] via INTRAVENOUS

## 2022-03-10 MED ORDER — SODIUM CHLORIDE 0.9 % WEIGHT BASED INFUSION
1.0000 mL/kg/h | INTRAVENOUS | Status: AC
  Administered 2022-03-10: 1 mL/kg/h via INTRAVENOUS

## 2022-03-10 MED ORDER — ASPIRIN 81 MG PO CHEW
81.0000 mg | CHEWABLE_TABLET | Freq: Every day | ORAL | Status: DC
  Administered 2022-03-12: 81 mg via ORAL
  Filled 2022-03-10: qty 1

## 2022-03-10 MED ORDER — LABETALOL HCL 5 MG/ML IV SOLN
INTRAVENOUS | Status: AC
  Filled 2022-03-10: qty 4

## 2022-03-10 MED ORDER — HYDRALAZINE HCL 20 MG/ML IJ SOLN
INTRAMUSCULAR | Status: DC | PRN
Start: 2022-03-10 — End: 2022-03-10
  Administered 2022-03-10: 10 mg via INTRAVENOUS

## 2022-03-10 MED ORDER — ACETAMINOPHEN 650 MG RE SUPP: 650.0000 mg | Freq: Four times a day (QID) | RECTAL | Status: DC | PRN

## 2022-03-10 MED ORDER — HEPARIN BOLUS VIA INFUSION
4000.0000 [IU] | Freq: Once | INTRAVENOUS | Status: AC
  Administered 2022-03-10: 4000 [IU] via INTRAVENOUS
  Filled 2022-03-10: qty 4000

## 2022-03-10 MED ORDER — LABETALOL HCL 5 MG/ML IV SOLN: 10.0000 mg | INTRAVENOUS | Status: AC | PRN

## 2022-03-10 MED ORDER — ACETAMINOPHEN 325 MG PO TABS
650.0000 mg | ORAL_TABLET | Freq: Four times a day (QID) | ORAL | Status: DC | PRN
  Administered 2022-03-10: 650 mg via ORAL
  Filled 2022-03-10: qty 2

## 2022-03-10 MED ORDER — GUAIFENESIN-DM 100-10 MG/5ML PO SYRP
10.0000 mL | ORAL_SOLUTION | ORAL | Status: DC | PRN
  Administered 2022-03-10 – 2022-03-12 (×2): 10 mL via ORAL
  Filled 2022-03-10 (×2): qty 10

## 2022-03-10 MED ORDER — NITROGLYCERIN 0.4 MG SL SUBL
0.4000 mg | SUBLINGUAL_TABLET | SUBLINGUAL | Status: DC | PRN
  Administered 2022-03-10 (×2): 0.4 mg via SUBLINGUAL
  Filled 2022-03-10: qty 1

## 2022-03-10 MED ORDER — AMOXICILLIN-POT CLAVULANATE 875-125 MG PO TABS: 1.0000 | ORAL_TABLET | Freq: Two times a day (BID) | ORAL | Status: DC

## 2022-03-10 MED ORDER — LABETALOL HCL 5 MG/ML IV SOLN
INTRAVENOUS | Status: DC | PRN
  Administered 2022-03-10: 15 mg via INTRAVENOUS

## 2022-03-10 MED ORDER — ONDANSETRON HCL 4 MG/2ML IJ SOLN
4.0000 mg | Freq: Four times a day (QID) | INTRAMUSCULAR | Status: DC | PRN
  Administered 2022-03-12: 4 mg via INTRAVENOUS
  Filled 2022-03-10: qty 2

## 2022-03-10 MED ORDER — SODIUM CHLORIDE 0.9% FLUSH
3.0000 mL | Freq: Two times a day (BID) | INTRAVENOUS | Status: DC
  Administered 2022-03-11 (×2): 3 mL via INTRAVENOUS

## 2022-03-10 MED ORDER — ASPIRIN 81 MG PO CHEW: 81.0000 mg | CHEWABLE_TABLET | ORAL | Status: DC

## 2022-03-10 MED ORDER — NITROGLYCERIN 1 MG/10 ML FOR IR/CATH LAB
INTRA_ARTERIAL | Status: DC | PRN
Start: 2022-03-10 — End: 2022-03-10
  Administered 2022-03-10 (×2): 200 ug
  Administered 2022-03-10: 150 ug
  Administered 2022-03-10: 200 ug

## 2022-03-10 SURGICAL SUPPLY — 22 items
BALL SAPPHIRE NC24 2.75X18 (BALLOONS) ×2
BALLN SCOREFLEX 2.75X10 (BALLOONS) ×2
BALLOON SAPPHIRE NC24 2.75X18 (BALLOONS) IMPLANT
BALLOON SCOREFLEX 2.75X10 (BALLOONS) IMPLANT
BAND ZEPHYR COMPRESS 30 LONG (HEMOSTASIS) ×1 IMPLANT
CATH INFINITI 5 FR JL3.5 (CATHETERS) ×1 IMPLANT
CATH INFINITI 5FR AL1 (CATHETERS) ×1 IMPLANT
CATH OPTITORQUE TIG 4.0 5F (CATHETERS) ×1 IMPLANT
CATH VISTA GUIDE 6FR JR4 (CATHETERS) ×1 IMPLANT
GLIDESHEATH SLEND A-KIT 6F 22G (SHEATH) ×1 IMPLANT
GUIDEWIRE INQWIRE 1.5J.035X260 (WIRE) IMPLANT
INQWIRE 1.5J .035X260CM (WIRE) ×2
KIT ENCORE 26 ADVANTAGE (KITS) ×1 IMPLANT
KIT HEART LEFT (KITS) ×2 IMPLANT
PACK CARDIAC CATHETERIZATION (CUSTOM PROCEDURE TRAY) ×2 IMPLANT
STENT SYNERGY XD 2.75X20 (Permanent Stent) IMPLANT
STENT SYNERGY XD 2.75X32 (Permanent Stent) IMPLANT
SYNERGY XD 2.75X20 (Permanent Stent) ×2 IMPLANT
SYNERGY XD 2.75X32 (Permanent Stent) ×2 IMPLANT
TRANSDUCER W/STOPCOCK (MISCELLANEOUS) ×2 IMPLANT
TUBING CIL FLEX 10 FLL-RA (TUBING) ×2 IMPLANT
WIRE COUGAR XT STRL 190CM (WIRE) ×1 IMPLANT

## 2022-03-10 NOTE — Assessment & Plan Note (Signed)
 #)   Essential Hypertension: documented h/o such, with outpatient antihypertensive regimen including Coreg, losartan, Norvasc.  SBP's in the ED today: Most recently in the 140s mmHg. in the setting of cardiology's request for n.p.o. status, will hold home antihypertensive medications for now.  Plan: Close monitoring of subsequent BP via routine VS. hold home antihypertensive medications, as above.

## 2022-03-10 NOTE — Progress Notes (Signed)
ANTICOAGULATION CONSULT NOTE - Initial Consult  Pharmacy Consult for Heparin Indication: chest pain/ACS  Allergies  Allergen Reactions   Detrol [Tolterodine Tartrate]     rash    Patient Measurements: Height: '5\' 1"'$  (154.9 cm) Weight: 108.9 kg (240 lb) IBW/kg (Calculated) : 47.8 Heparin Dosing Weight: 75 kg  Vital Signs: Temp: 98.1 F (36.7 C) (05/23 0112) Temp Source: Oral (05/23 0112) BP: 164/68 (05/23 0315) Pulse Rate: 89 (05/23 0315)  Labs: Recent Labs    03/10/22 0142 03/10/22 0313  HGB 9.8*  --   HCT 29.7*  --   PLT 277  --   CREATININE 0.95  --   TROPONINIHS 15 28*    Estimated Creatinine Clearance: 77.2 mL/min (by C-G formula based on SCr of 0.95 mg/dL).   Medical History: Past Medical History:  Diagnosis Date   Anemia    CHF (congestive heart failure) ([place]) 04/2011   EF 15-20% from echo 05/19/11   DOE (dyspnea on exertion)    DVT (deep venous thrombosis) ([place]) 06/2011   LLE   Fatigue    HTN (hypertension)    Menorrhagia    with iron deficient anemia   NSTEMI (non-ST elevated myocardial infarction) ([place]) 10/26/2017   NSTEMI (non-ST elevated myocardial infarction) ([place]) 10/27/2017   Obesity    Orthopnea    Type II diabetes mellitus (HCC)     Medications:  No current facility-administered medications on file prior to encounter.   Current Outpatient Medications on File Prior to Encounter  Medication Sig Dispense Refill   amLODipine (NORVASC) 10 MG tablet Take 1 tablet (10 mg total) by mouth daily. 30 tablet 2   aspirin 81 MG tablet Take 81 mg by mouth every morning.      atorvastatin (LIPITOR) 40 MG tablet Take 40 mg by mouth daily.  1   carvedilol (COREG) 25 MG tablet Take 1 tablet (25 mg total) by mouth 2 (two) times daily. 60 tablet 11   Cholecalciferol (VITAMIN D-3) 1000 UNITS CAPS Take 1 capsule by mouth daily.      Ferrous Sulfate (IRON) 325 (65 FE) MG TABS Take 1 tablet by mouth 2 (two) times daily.       furosemide (LASIX) 40 MG tablet  Take 40 mg by mouth daily.      losartan (COZAAR) 100 MG tablet Take 100 mg by mouth daily.     metFORMIN (GLUCOPHAGE) 1000 MG tablet Take 1,000 mg by mouth 2 (two) times daily with a meal.      nitroGLYCERIN (NITROSTAT) 0.4 MG SL tablet Place 1 tablet (0.4 mg total) under the tongue every 5 (five) minutes x 3 doses as needed for chest pain. 30 tablet 1   ondansetron (ZOFRAN ODT) 4 MG disintegrating tablet Take 1 tablet (4 mg total) by mouth every 8 (eight) hours as needed for nausea or vomiting. 10 tablet 0   pantoprazole (PROTONIX) 40 MG tablet Take 1 tablet (40 mg total) by mouth daily. 30 tablet 1   TRULICITY 4.25 ZD/6.3OV SOPN Inject 0.75 mg into the skin once a week.       Assessment: 56 y.o. female with chest pain for heparin  Goal of Therapy:  Heparin level 0.3-0.7 units/ml Monitor platelets by anticoagulation protocol: Yes   Plan:  Heparin 4000 units IV bolus, then start heparin 1200 units/hr Check heparin level in 6 hours.   Caryl Pina 03/10/2022,4:43 AM

## 2022-03-10 NOTE — H&P (Signed)
History and Physical    PLEASE NOTE THAT DRAGON DICTATION SOFTWARE WAS USED IN THE CONSTRUCTION OF THIS NOTE.   [name] QPY:[id number] DOB: Mar 16, 1966 DOA: 03/10/2022  PCP: Trey Sailors, PA  Patient coming from: home   I have personally briefly reviewed patient's old medical records in [address]  Chief Complaint: Chest pain  HPI: [name] is a 56 y.o. female with medical history significant for coronary artery disease status post NSTEMI in 2019 at which time she underwent PCI with stent placement to the RCA, essential pretension, type 2 diabetes mellitus, chronic iron deficiency anemia associated baseline hemoglobin 10-12, who is admitted to [facility] on 03/10/2022 with unstable angina after presenting from home to [facility] ED complaining of chest pain.   The patient reports sudden onset of substernal chest pressure radiating into the back starting last evening while at rest.  While she did not ambulate after onset of the chest pain to assess for an exertional component, she did take sublingual nitroglycerin x3, following which the chest pain completely resolved without subsequent recurrence.  She notes that the pain was nonpositional, nonpleuritic, not reproducible with palpation over the anterior chest wall.  Denied any associated shortness of breath, palpitations, diaphoresis, nausea, vomiting, dizziness, presyncope, or syncope.  Patient on full dose aspirin x1 and subsequently contacted EMS who brought her to [facility] emergency department for further evaluation and management of aforementioned chest pain episode.  She conveys that the quality, intensity, and distribution of last evening's episode of chest discomfort is very similar to the chest pain that she presented with in 2019 at which time she was diagnosed with NSTEMI and underwent PCI with stent placement to the RCA.  Denies any recent worsening of peripheral edema, nor any recent calf tenderness or  new lower extremity erythema.  She notes that she has had a nonproductive cough over the course of the last month, but denies any associated hemoptysis.  No recent trauma or travel.  No recent melena or hematochezia.  Denies any recent subjective fever, chills, rigors, or generalized myalgias.  Following her PCI with stent placement to the RCA in 2019, her most recent cardiac cath occurred in January 2021, at which time she was admitted for unstable angina.  No revascularization therapy occurred at the time of the January 2021 cardiac cath, rather there is recommendation for medical management thereof.   Most recent echocardiogram occurred in January 2021, it was notable for LVEF 60 to 65%, no focal motion normalities, and normal diastolic parameters.  She confirms that she has been chest pain-free throughout her ED course today.  She also has a history of chronic iron deficiency anemia associated with baseline hemoglobin range of 10-12.    ED Course:  Vital signs in the ED were notable for the following: Afebrile; heart rate 77-89; blood pressure 145/71 - 168/81; respiratory rate 16-21, oxygen saturation 96 to 100% on room air.  Labs were notable for the following: BMP notable for the following: Potassium 4.0, bicarb 24, anion gap 8, creatinine 0.95, glucose 290.  Initial high-sensitivity troponin found to be 15, with repeat value trending up to 28.  CBC notable for episode count 5700, hemoglobin 9.8 and associated with microcytic finding.  Imaging and additional notable ED work-up: EKG, in comparison to most recent prior from May 2022 shows sinus rhythm with heart rate 79, normal intervals, nonspecific T wave inversion in lead III, as well as minimal ST depression in the inferior leads,  which appears new relative to most recent prior EKG, will demonstrate no evidence of ST elevation.  Chest x-ray showed no evidence of acute cardiopulmonary process, including no evidence of infiltrate, edema,  effusion, or pneumothorax.  EDP discussed the patient's case with on-call cardiology, Dr. Irene Limbo, Who recommended admission to the hospitalist service for further evaluation management of unstable angina.  Dr. Irene Limbo, conveyed that cardiology will formally consult, and requested initiation of heparin drip will asking that the patient also be kept n.p.o., with cardiology to further consider whether or not the patient will go to cardiac cath today.  While in the ED, the following were administered: Heparin bolus followed by initiation heparin drip.  Subsequently, the patient was admitted for further evaluation and management of unstable angina.     Review of Systems: As per HPI otherwise 10 point review of systems negative.   Past Medical History:  Diagnosis Date   Anemia    CAD (coronary artery disease)    PCI with stent to RCA in 2019   CHF (congestive heart failure) ([place]) 04/2011   EF 15-20% from echo 05/19/11   DOE (dyspnea on exertion)    DVT (deep venous thrombosis) ([place]) 06/2011   LLE   Fatigue    HTN (hypertension)    Menorrhagia    with iron deficient anemia   NSTEMI (non-ST elevated myocardial infarction) ([place]) 10/26/2017   NSTEMI (non-ST elevated myocardial infarction) ([place]) 10/27/2017   Obesity    Orthopnea    Type II diabetes mellitus ([place])     Past Surgical History:  Procedure Laterality Date   CORONARY ANGIOPLASTY WITH STENT PLACEMENT  2013;  10/27/2017   CORONARY STENT INTERVENTION N/A 10/27/2017   Procedure: CORONARY STENT INTERVENTION;  Surgeon: [name], MD;  Location: [place] [facility];  Service: Cardiovascular;  Laterality: N/A;   LEFT HEART CATH AND CORONARY ANGIOGRAPHY N/A 10/27/2017   Procedure: LEFT HEART CATH AND CORONARY ANGIOGRAPHY;  Surgeon: [name], MD;  Location: [place] [facility];  Service: Cardiovascular;  Laterality: N/A;   LEFT HEART CATH AND CORONARY ANGIOGRAPHY N/A 11/17/2019   Procedure: LEFT HEART CATH AND CORONARY  ANGIOGRAPHY;  Surgeon: [name], MD;  Location: [place] [facility];  Service: Cardiovascular;  Laterality: N/A;   LEFT HEART CATHETERIZATION WITH CORONARY ANGIOGRAM N/A 11/17/2011   Procedure: LEFT HEART CATHETERIZATION WITH CORONARY ANGIOGRAM;  Surgeon: Laverda Page, MD;  Location[facility];  Service: Cardiovascular;  Laterality: N/A;   OVARIAN CYST REMOVAL     TUBAL LIGATION  2006   ULTRASOUND GUIDANCE FOR VASCULAR ACCESS  10/27/2017   Procedure: Ultrasound Guidance For Vascular Access;  Surgeon: [name], MD;  Location: [place] [facility];  Service: Cardiovascular;;    Social History:  reports that she quit smoking about 28 years ago. Her smoking use included cigarettes. She has a 2.50 pack-year smoking history. She has never used smokeless tobacco. She reports that she does not drink alcohol and does not use drugs.   Allergies  Allergen Reactions   Detrol [Tolterodine Tartrate]     rash    Family History  Problem Relation Age of Onset   Hypertension Mother    Stroke Father    Heart attack Sister    Stroke Sister     Family history reviewed and not pertinent    Prior to Admission medications   Medication Sig Start Date End Date Taking? Authorizing Provider  amLODipine (NORVASC) 10 MG tablet Take 1 tablet (  10 mg total) by mouth daily. 11/22/19 03/10/22 Yes Miquel Dunn, NP  aspirin 81 MG tablet Take 81 mg by mouth every morning.    Yes [provider]  atorvastatin (LIPITOR) 40 MG tablet Take 40 mg by mouth daily. 12/06/16  Yes [provider]  carvedilol (COREG) 25 MG tablet Take 1 tablet (25 mg total) by mouth 2 (two) times daily. 11/03/17 03/10/22 Yes Patwardhan, Manish J, MD  Cholecalciferol (VITAMIN D-3) 1000 UNITS CAPS Take 1 capsule by mouth daily.    Yes [provider]  Ferrous Sulfate (IRON) 325 (65 FE) MG TABS Take 1 tablet by mouth 2 (two) times daily.     Yes [provider]  furosemide (LASIX) 40  MG tablet Take 40 mg by mouth daily.    Yes [provider]  glipiZIDE (GLUCOTROL XL) 5 MG 24 hr tablet Take 5 mg by mouth daily with breakfast.   Yes [provider]  losartan (COZAAR) 100 MG tablet Take 100 mg by mouth daily. 10/21/19  Yes [provider]  metFORMIN (GLUCOPHAGE) 1000 MG tablet Take 1,000 mg by mouth 2 (two) times daily with a meal.    Yes [provider]  nitroGLYCERIN (NITROSTAT) 0.4 MG SL tablet Place 1 tablet (0.4 mg total) under the tongue every 5 (five) minutes x 3 doses as needed for chest pain. 10/28/17  Yes Patwardhan, Manish J, MD  ondansetron (ZOFRAN ODT) 4 MG disintegrating tablet Take 1 tablet (4 mg total) by mouth every 8 (eight) hours as needed for nausea or vomiting. Patient not taking: Reported on 03/10/2022 02/07/17   Nona Dell, PA-C  pantoprazole (PROTONIX) 40 MG tablet Take 1 tablet (40 mg total) by mouth daily. Patient not taking: Reported on 03/10/2022 11/17/19 11/16/20  [name], MD     Objective    Physical Exam: Vitals:   03/10/22 0300 03/10/22 0315 03/10/22 0330 03/10/22 0430  BP: (!) 166/80 (!) 164/68 (!) 145/71 (!) 159/72  Pulse: 86 89 80 78  Resp: 16 (!) 24 19 (!) 21  Temp:      TempSrc:      SpO2: 98% 98% 98% 99%  Weight:      Height:        General: appears to be stated age; alert, oriented Skin: warm, dry, no rash Head:  AT/[place] Mouth:  Oral mucosa membranes appear moist, normal dentition Neck: supple; trachea midline Heart:  RRR; did not appreciate any M/R/G Lungs: CTAB, did not appreciate any wheezes, rales, or rhonchi Abdomen: + BS; soft, ND, NT Vascular: 2+ pedal pulses b/l; 2+ radial pulses b/l Extremities: no peripheral edema, no muscle wasting Neuro: strength and sensation intact in upper and lower extremities b/l   Labs on Admission: I have personally reviewed following labs and imaging studies  CBC: Recent Labs  Lab 03/10/22 0142  WBC 5.7  HGB 9.8*  HCT  29.7*  MCV 74.3*  PLT 341   Basic Metabolic Panel: Recent Labs  Lab 03/10/22 0142  NA 139  K 4.0  CL 107  CO2 24  GLUCOSE 290*  BUN 15  CREATININE 0.95  CALCIUM 8.8*   GFR: Estimated Creatinine Clearance: 77.2 mL/min (by C-G formula based on SCr of 0.95 mg/dL). Liver Function Tests: No results for input(s): AST, ALT, ALKPHOS, BILITOT, PROT, ALBUMIN in the last 168 hours. No results for input(s): LIPASE, AMYLASE in the last 168 hours. No results for input(s): AMMONIA in the last 168 hours. Coagulation Profile: No results  for input(s): INR, PROTIME in the last 168 hours. Cardiac Enzymes: No results for input(s): CKTOTAL, CKMB, CKMBINDEX, TROPONINI in the last 168 hours. BNP (last 3 results) No results for input(s): PROBNP in the last 8760 hours. HbA1C: No results for input(s): HGBA1C in the last 72 hours. CBG: No results for input(s): GLUCAP in the last 168 hours. Lipid Profile: No results for input(s): CHOL, HDL, LDLCALC, TRIG, CHOLHDL, LDLDIRECT in the last 72 hours. Thyroid Function Tests: No results for input(s): TSH, T4TOTAL, FREET4, T3FREE, THYROIDAB in the last 72 hours. Anemia Panel: No results for input(s): VITAMINB12, FOLATE, FERRITIN, TIBC, IRON, RETICCTPCT in the last 72 hours. Urine analysis:    Component Value Date/Time   COLORURINE YELLOW 02/07/2017 1227   APPEARANCEUR HAZY (A) 02/07/2017 1227   LABSPEC 1.017 02/07/2017 1227   PHURINE 5.0 02/07/2017 1227   GLUCOSEU NEGATIVE 02/07/2017 1227   HGBUR NEGATIVE 02/07/2017 [place] 02/07/2017 1227   KETONESUR NEGATIVE 02/07/2017 1227   PROTEINUR NEGATIVE 02/07/2017 1227   UROBILINOGEN 0.2 11/01/2014 1210   NITRITE NEGATIVE 02/07/2017 1227   LEUKOCYTESUR TRACE (A) 02/07/2017 1227    Radiological Exams on Admission: DG Chest 2 View  Result Date: 03/10/2022 CLINICAL DATA:  Chest pain. EXAM: CHEST - 2 VIEW COMPARISON:  Chest radiograph dated 03/02/2021. FINDINGS: The heart size and  mediastinal contours are within normal limits. Both lungs are clear. The visualized skeletal structures are unremarkable. IMPRESSION: No active cardiopulmonary disease. Electronically Signed   By: Anner Crete M.D.   On: 03/10/2022 01:47     EKG: Independently reviewed, with result as described above.    Assessment/Plan   Principal Problem:   Unstable angina (HCC) Active Problems:   Essential hypertension   Chronic iron deficiency anemia   Chronic diastolic CHF (congestive heart failure) (HCC)   DM2 (diabetes mellitus, type 2) (HCC)     #) Unstable angina: Patient presents with 1 episode of substernal chest pressure that resolved following sublingual nitroglycerin x3 prior to arrival in the ED, with initial high-sensitivity troponin 15, with repeat value trending up to 28, and EKG showing evidence of less than 1 mm ST depression in inferior leads, which appears new relative to most recent prior EKG from 1 year ago, while chest x-ray shows no evidence of acute cardiopulmonary process, including no evidence of pneumothorax.  Patient chest pain-free throughout ED course.  Case was discussed with on-call cardiology, Dr. Irene Limbo, Who felt that this presentation was most consistent with unstable angina.  He requested admission to the hospital service, and plans to formally consult, recommending initiation of heparin drip, will also requesting the patient be kept n.p.o. as cardiology decides whether or not to take the patient for cardiac catheterization today.  Of note, she received a full dose aspirin earlier this evening, in addition to her home high intensity atorvastatin, Coreg, and losartan.    Plan: Monitor on telemetry.  Cardiology consulted.  Heparin drip, n.p.o., per cardiology recommendations.  Continue to trend troponin.  Prn sublingual nitroglycerin ordered.  Add on serum magnesium level.  Echocardiogram ordered.           #) Chronic diastolic heart failure: documented  history of such, along most recent echocardiogram performed in January 2021 demonstrated LVEF 60 to 65% as well as normal diastolic parameters. No clinical or radiographic evidence to suggest acutely decompensated heart failure at this time. home diuretic regimen reportedly consists of the following: Lasix 40 mg p.o. daily.   Plan: monitor strict I's &  O's and daily weights. Repeat BMP in AM. Check serum mag level.  In the setting of suspected euvolemic status and cardiology request for n.p.o., will hold home Lasix for now.         #) Essential Hypertension: documented h/o such, with outpatient antihypertensive regimen including Coreg, losartan, Norvasc.  SBP's in the ED today: Most recently in the 140s mmHg. in the setting of cardiology's request for n.p.o. status, will hold home antihypertensive medications for now.  Plan: Close monitoring of subsequent BP via routine VS. hold home antihypertensive medications, as above.         #) Type 2 Diabetes Mellitus: documented history of such. Home insulin regimen: None. Home oral hypoglycemic agents: Metformin, glipizide. presenting blood sugar: 290.   Plan: accuchecks every 6 hours low with low dose SSI. hold home oral hypoglycemic agents during this hospitalization.          #) Chronic iron deficiency anemia: Documented history of such, associated with baseline hemoglobin range of 10-12, with presenting hemoglobin consistent with this range of presenting value noted to be 9.8.  On daily oral iron supplementation at home.  Interestingly, this evening's hemoglobin is assisted with microcytic findings, without reported history to suggest recent/active bleed.  However, in the context of the patient's presenting unstable angina, will add on iron studies to ensure no contribution towards type II process as a consequence of acute on chronic iron deficiency anemia.  On daily baby aspirin as outpatient, otherwise on no blood thinners at  home.  Plan: Add on ferritin, DBC, total iron.  Check INR.  Repeat CBC tomorrow morning.       DVT prophylaxis: Heparin drip Code Status: Full code Family Communication: none Disposition Plan: Per Rounding Team Consults called: Case discussed with on-call cardiology, Dr. Irene Limbo, Who will formally consult, as further detailed above;  Admission status: Inpatient   PLEASE NOTE THAT DRAGON DICTATION SOFTWARE WAS USED IN THE CONSTRUCTION OF THIS NOTE.   Pena DO [facility]  From [place]   03/10/2022, 5:13 AM

## 2022-03-10 NOTE — Plan of Care (Signed)

## 2022-03-10 NOTE — Assessment & Plan Note (Signed)
 #)   Type 2 Diabetes Mellitus: documented history of such. Home insulin regimen: None. Home oral hypoglycemic agents: Metformin, glipizide. presenting blood sugar: 290.   Plan: accuchecks every 6 hours low with low dose SSI. hold home oral hypoglycemic agents during this hospitalization.

## 2022-03-10 NOTE — Consult Note (Addendum)
CARDIOLOGY CONSULT NOTE  Patient ID: [name] MRN: [id number] DOB/AGE: 1966/01/12 56 y.o.  Admit date: 03/10/2022 Referring Physician: [facility] Reason for Consultation: Chest pain  HPI:   56 y/o African-American female with hypertension, type 2 diabetes mellitus, coronary artery disease status post mid RCA PCI 2013 and 2019, residual Mid LCx, OM1, OM2, Diag 2, prox-mid RCA, Rt PDA moderate to severe disease, admitted for chest pain  I last saw the patient in 07/2020.  She had been lost to follow-up since then.  Patient has been noticing exertional dyspnea for past few weeks, along with "mucus" in her sputum.  Last night, patient had left-sided chest pressure radiating to left arm, similar to her episode in 2019.  Episode lasted for several minutes.  She was brought to the emergency room.  Work-up in the ER so far showed mildly elevated and rising high sensitive troponin.  Patient is currently chest pain-free.  Blood pressure is elevated today.  Patient endorses compliance with her baseline antihypertensive medical therapy.  Past Medical History:  Diagnosis Date   Anemia    CAD (coronary artery disease)    PCI with stent to RCA in 2019   CHF (congestive heart failure) ([place]) 04/2011   EF 15-20% from echo 05/19/11   DOE (dyspnea on exertion)    DVT (deep venous thrombosis) ([place]) 06/2011   LLE   Fatigue    HTN (hypertension)    Menorrhagia    with iron deficient anemia   NSTEMI (non-ST elevated myocardial infarction) ([place]) 10/26/2017   NSTEMI (non-ST elevated myocardial infarction) ([place]) 10/27/2017   Obesity    Orthopnea    Type II diabetes mellitus ([place])      Past Surgical History:  Procedure Laterality Date   CORONARY ANGIOPLASTY WITH STENT PLACEMENT  2013;  10/27/2017   CORONARY STENT INTERVENTION N/A 10/27/2017   Procedure: CORONARY STENT INTERVENTION;  Surgeon: [name], MD;  Location: [place] [facility];  Service: Cardiovascular;   Laterality: N/A;   LEFT HEART CATH AND CORONARY ANGIOGRAPHY N/A 10/27/2017   Procedure: LEFT HEART CATH AND CORONARY ANGIOGRAPHY;  Surgeon: [name], MD;  Location: [place] [facility];  Service: Cardiovascular;  Laterality: N/A;   LEFT HEART CATH AND CORONARY ANGIOGRAPHY N/A 11/17/2019   Procedure: LEFT HEART CATH AND CORONARY ANGIOGRAPHY;  Surgeon: [name], MD;  Location: [place] [facility];  Service: Cardiovascular;  Laterality: N/A;   LEFT HEART CATHETERIZATION WITH CORONARY ANGIOGRAM N/A 11/17/2011   Procedure: LEFT HEART CATHETERIZATION WITH CORONARY ANGIOGRAM;  Surgeon: Laverda Page, MD;  Location: [facility];  Service: Cardiovascular;  Laterality: N/A;   OVARIAN CYST REMOVAL     TUBAL LIGATION  2006   ULTRASOUND GUIDANCE FOR VASCULAR ACCESS  10/27/2017   Procedure: Ultrasound Guidance For Vascular Access;  Surgeon: [name], MD;  Location: [place] [facility];  Service: Cardiovascular;;      Family History  Problem Relation Age of Onset   Hypertension Mother    Stroke Father    Heart attack Sister    Stroke Sister      Social History: Social History   Socioeconomic History   Marital status: Widowed    Spouse name: Not on file   Number of children: 3   Years of education: Not on file   Highest education level: Not on file  Occupational History   Occupation: Health visitor Op  Tobacco Use   Smoking status: Former  Packs/day: 0.50    Years: 5.00    Pack years: 2.50    Types: Cigarettes    Quit date: 10/19/1993    Years since quitting: 28.4   Smokeless tobacco: Never  Vaping Use   Vaping Use: Never used  Substance and Sexual Activity   Alcohol use: No   Drug use: No   Sexual activity: Not Currently  Other Topics Concern   Not on file  Social History Narrative   Not on file   Social Determinants of Health   Financial Resource Strain: Not on file  Food Insecurity: Not on file  Transportation Needs: Not on file  Physical  Activity: Not on file  Stress: Not on file  Social Connections: Not on file  Intimate Partner Violence: Not on file       Review of Systems  Cardiovascular:  Positive for chest pain and dyspnea on exertion. Negative for leg swelling, palpitations and syncope.     Physical Exam: Physical Exam Vitals and nursing note reviewed.  Constitutional:      General: She is not in acute distress. Neck:     Vascular: No JVD.  Cardiovascular:     Rate and Rhythm: Normal rate and regular rhythm.     Pulses: Normal pulses.     Heart sounds: Normal heart sounds. No murmur heard. Pulmonary:     Effort: Pulmonary effort is normal.     Breath sounds: Normal breath sounds. No wheezing or rales.  Musculoskeletal:     Right lower leg: No edema.     Left lower leg: No edema.       Imaging/tests reviewed and independently interpreted: Lab Results: CBC, BMP, high sensitive troponin  Cardiac Studies:  Telemetry 03/10/2022: No significant arrhythmia  EKG 03/10/2022: Sinus rhythm 79 bpm Minimal ST depression in inferior leads, consider ischemia  Echocardiogram: Pending  Prior cardiac workup:  Coronary angiography 11/17/2019: LM: Normal LAD: Diag 2 ostial 80% stenosis (unchanged since 10/2017) LCx: Ostial 40%, mid 70%, small OM 60% stenoses (unchanged since 10/2017) RCA: Prox 40% stenoses. Mid overlapping stents with no ISR. RPL focal 40% stenosis (unchanged since 10/2017)   Normal LVEDP   Impression: Unchanged coronary anatomy with patent RCA stents with no ISR. While she has moderate lesions, as described above, they are relatively unchanged over three caths in 2013, 2019, and 2021. She has not had any stable angina since RCA PCI in 2019. There is no evidence of myocardial injury on HS trop and echocardiogram. Her chest pain is unlikely to be of coronary origin. Consider noncardiac etiology.    Echocardiogram 11/16/2019: LVEF 60-65%. No significant valvular abnormality.    Assessment  & Recommendations:  56 y/o African-American female with hypertension, type 2 diabetes mellitus, coronary artery disease status post mid RCA PCI 2013 and 2019, residual Mid LCx, OM1, OM2, Diag 2, prox-mid RCA, Rt PDA moderate to severe disease, admitted for chest pain  NSTEMI: Chest pain at rest with mildly elevated and rising troponin.  No coronary artery disease with patent RCA stent in 2021 with multivessel moderate disease in small caliber vessels, treated medically at that time. Recommend aspirin, heparin, Lipitor. Continue amlodipine, carvedilol.  Hold losartan in anticipation for coronary angiogram today.  Check echocardiogram, lipid panel Plan for coronary angiogram and possible intervention later today.   Schedule for cardiac catheterization, and possible intervention.  We discussed regarding risks, benefits, alternatives to this including stress testing, CTA and continued medical therapy. Patient wants to proceed. Understands <1-2% risk of death,  stroke, MI, urgent CABG, bleeding, infection, renal failure but not limited to these.  Hypertension: Uncontrolled. In addition to baseline antihypertensive therapy, will add BiDil 20-37.5 mg 3 times daily.  Type II DM: Management as per primary team.  Discussed interpretation of tests and management recommendations with the primary team     [name], MD Pager: [phone] Office: [phone]

## 2022-03-10 NOTE — Progress Notes (Signed)
PCI to OM on 5/24 at 7:30 AM  We discussed regarding risks, benefits, alternatives to this including stress testing, CTA and continued medical therapy. Patient wants to proceed. Understands <1-2% risk of death, stroke, MI, urgent CABG, bleeding, infection, renal failure but not limited to these.    [name], MD Pager: [phone] Office: [phone]

## 2022-03-10 NOTE — Progress Notes (Addendum)
PROGRESS NOTE    [name]  UKG:[id number] DOB: 13-Feb-1966 DOA: 03/10/2022 PCP: Trey Sailors, PA   Brief Narrative:  [name] is a 56 y.o. female with medical history significant for coronary artery disease status post NSTEMI in 2019 at which time she underwent PCI with stent placement to the RCA, essential pretension, type 2 diabetes mellitus, chronic iron deficiency anemia associated baseline hemoglobin 10-12, who is admitted to [facility] on 03/10/2022 with unstable angina. Cardiology to follow in consult.  Assessment & Plan:   Principal Problem:   Unstable angina (HCC) Active Problems:   Essential hypertension   Chronic iron deficiency anemia   Chronic diastolic CHF (congestive heart failure) (HCC)   DM2 (diabetes mellitus, type 2) (HCC)   Unstable angina with profound CAD history -Cardiology following, plan for cardiac catheterization later this morning -Resume core measures post procedure per cardiology (on aspirin, statin, carvedilol, furosemide, losartan at home) -Echo pending  Chronic systolic HF without exacerbation -Euvolemic, follow echo as above -Cardiology following, on carvedilol, furosemide, losartan at home  HTN, essential -N.p.o. prior to procedure, resume home medications pending cath findings as above  IDDM2 uncontrolled with hyperglycemia -A1c 8.7 -Continue sliding scale insulin, hypoglycemic protocol  Presumed bronchitis, ruled out Possible pulmonary edema vs cardiogenic wheeze -Procalcitonin negative, without fever or leukocytosis -discontinue antibiotics and follow clinically -Patient without hypoxia or respiratory symptoms at this point -Chest x-ray without overt infiltrate or findings, questionable hazy bilateral pulmonary edema noted compared to prior imaging in February per personal review -Does not meet sepsis criteria -Continue to follow clinically, given acute onset concerning for more central etiology including  flash pulmonary edema, possible aspiration pneumonitis, or bronchospasm.  Anemia, likely combined iron deficiency and anemia of chronic disease, at baseline -At baseline, follow repeat labs no signs or symptoms of bleeding   DVT prophylaxis: Heparin drip off, follow-up postcardiac cath Code Status: Full Family Communication: None present  Status is: Inpatient  Dispo: The patient is from: Home              Anticipated d/c is to: Home              Anticipated d/c date is: 24 to 48 hours              Patient currently not medically stable for discharge  Consultants:  Cardiology  Procedures:  Cardiac cath planned 03/10/2022  Antimicrobials:  Augmentin initiated 5/23 -consider early discontinuation pending above results  Subjective: No acute issues or events overnight denies chest pain nausea vomiting diarrhea constipation shortness of breath cough fevers  Objective: Vitals:   03/10/22 0330 03/10/22 0430 03/10/22 0600 03/10/22 0700  BP: (!) 145/71 (!) 159/72 (!) 161/88 (!) 183/125  Pulse: 80 78 69 75  Resp: 19 (!) '21 19 15  '$ Temp:      TempSrc:      SpO2: 98% 99% 98% 97%  Weight:      Height:       No intake or output data in the 24 hours ending 03/10/22 0744 Filed Weights   03/10/22 0112  Weight: 108.9 kg    Examination:  General:  Pleasantly resting in bed, No acute distress. HEENT:  Normocephalic atraumatic.  Sclerae nonicteric, noninjected.  Extraocular movements intact bilaterally. Neck:  Without mass or deformity.  Trachea is midline. Lungs:  Clear to auscultate bilaterally without rhonchi, wheeze, or rales. Heart:  Regular rate and rhythm.  Without murmurs, rubs, or gallops. Abdomen:  Soft, nontender, nondistended.  Without guarding or rebound. Extremities: Without cyanosis, clubbing, edema, or obvious deformity. Vascular:  Dorsalis pedis and posterior tibial pulses palpable bilaterally. Skin:  Warm and dry, no erythema, no ulcerations.  Data Reviewed: I  have personally reviewed following labs and imaging studies  CBC: Recent Labs  Lab 03/10/22 0142  WBC 5.7  HGB 9.8*  HCT 29.7*  MCV 74.3*  PLT 188   Basic Metabolic Panel: Recent Labs  Lab 03/10/22 0142 03/10/22 0507  NA 139  --   K 4.0  --   CL 107  --   CO2 24  --   GLUCOSE 290*  --   BUN 15  --   CREATININE 0.95  --   CALCIUM 8.8*  --   MG  --  2.0   GFR: Estimated Creatinine Clearance: 77.2 mL/min (by C-G formula based on SCr of 0.95 mg/dL). Liver Function Tests: No results for input(s): AST, ALT, ALKPHOS, BILITOT, PROT, ALBUMIN in the last 168 hours. No results for input(s): LIPASE, AMYLASE in the last 168 hours. No results for input(s): AMMONIA in the last 168 hours. Coagulation Profile: Recent Labs  Lab 03/10/22 0507  INR 1.0   Cardiac Enzymes: No results for input(s): CKTOTAL, CKMB, CKMBINDEX, TROPONINI in the last 168 hours. BNP (last 3 results) No results for input(s): PROBNP in the last 8760 hours. HbA1C: Recent Labs    03/10/22 0454  HGBA1C 8.7*   CBG: Recent Labs  Lab 03/10/22 0534  GLUCAP 190*   Lipid Profile: No results for input(s): CHOL, HDL, LDLCALC, TRIG, CHOLHDL, LDLDIRECT in the last 72 hours. Thyroid Function Tests: No results for input(s): TSH, T4TOTAL, FREET4, T3FREE, THYROIDAB in the last 72 hours. Anemia Panel: No results for input(s): VITAMINB12, FOLATE, FERRITIN, TIBC, IRON, RETICCTPCT in the last 72 hours. Sepsis Labs: No results for input(s): PROCALCITON, LATICACIDVEN in the last 168 hours.  No results found for this or any previous visit (from the past 240 hour(s)).   Radiology Studies: DG Chest 2 View  Result Date: 03/10/2022 CLINICAL DATA:  Chest pain. EXAM: CHEST - 2 VIEW COMPARISON:  Chest radiograph dated 03/02/2021. FINDINGS: The heart size and mediastinal contours are within normal limits. Both lungs are clear. The visualized skeletal structures are unremarkable. IMPRESSION: No active cardiopulmonary disease.  Electronically Signed   By: Anner Crete M.D.   On: 03/10/2022 01:47    Scheduled Meds:  insulin aspart  0-6 Units Subcutaneous Q6H   Continuous Infusions:  heparin 1,200 Units/hr (03/10/22 0531)    LOS: 0 days   Time spent: 25mn  Vivan Agostino C Ahniyah Giancola, DO [facility]  If 7PM-7AM, please contact night-coverage [email or web address]  03/10/2022, 7:44 AM

## 2022-03-10 NOTE — ED Triage Notes (Signed)
Patient reports chest pain that began yesterday morning and has improved with SL nitro from EMS. History of previous MI 79.

## 2022-03-10 NOTE — Progress Notes (Addendum)
Physical Exam  Neck: No JVD present.  Cardiovascular: Regular rhythm, normal heart sounds, intact distal pulses and normal pulses. Exam reveals no gallop.  No murmur heard. Pulmonary/Chest: Effort normal. She has diffuse rales and diffuse wheezes.  Abdominal: Soft. Bowel sounds are normal.  Musculoskeletal:        General: No edema.    On my consult note by physical exam had stated clear lung fields, however she does have diffuse rales and diffuse wheezing bilateral, suspect she has acute bronchitis ongoing for the past 8 to 10 days with a hacking cough  I will place her on Augmentin.

## 2022-03-10 NOTE — Consult Note (Signed)
CARDIOLOGY CONSULT NOTE  Patient ID: [name] MRN: [id number] DOB/AGE: [birth year] 54 y.o.  Admit date: 03/10/2022 Referring Physician  Holli Humbles Primary Physician:  Trey Sailors, [place] Reason for Consultation  Unstable angina  Patient ID: [name], female    DOB: 16-Aug-1966, 56 y.o.   MRN: [id number]  Chief Complaint  Patient presents with   Chest Pain    Chest pain that began this morning and improved with two  SL nitro.    HPI:    [name]  is a 56 y.o. African-American female patient with coronary artery disease and history of stenting to the right coronary artery in 2013 and again on 10/28/2019, doing well until last Sunday 3 days ago started having chest tightness while she was exercising.  Yesterday while just at home, she had severe pain described as 10 out of 10 in intensity retrosternal, had to take 3 sublingual nitroglycerin and aspirin with relief and EMS was called.  She still continues to have mild chest discomfort but states that this has significantly improved compared to last night.  Chest pain was associated with worsening dyspnea over the past 2 to 3 days.  Past Medical History:  Diagnosis Date   Anemia    CAD (coronary artery disease)    PCI with stent to RCA in 2019   CHF (congestive heart failure) ([place]) 04/2011   EF 15-20% from echo 05/19/11   DOE (dyspnea on exertion)    DVT (deep venous thrombosis) ([place]) 06/2011   LLE   Fatigue    HTN (hypertension)    Menorrhagia    with iron deficient anemia   NSTEMI (non-ST elevated myocardial infarction) ([place]) 10/26/2017   NSTEMI (non-ST elevated myocardial infarction) ([place]) 10/27/2017   Obesity    Orthopnea    Type II diabetes mellitus ([place])    Past Surgical History:  Procedure Laterality Date   CORONARY ANGIOPLASTY WITH STENT PLACEMENT  2013;  10/27/2017   CORONARY STENT INTERVENTION N/A 10/27/2017   Procedure: CORONARY STENT INTERVENTION;  Surgeon: [name], MD;   Location: [facility];  Service: Cardiovascular;  Laterality: N/A;   LEFT HEART CATH AND CORONARY ANGIOGRAPHY N/A 10/27/2017   Procedure: LEFT HEART CATH AND CORONARY ANGIOGRAPHY;  Surgeon: [name], MD;  Location: [facility];  Service: Cardiovascular;  Laterality: N/A;   LEFT HEART CATH AND CORONARY ANGIOGRAPHY N/A 11/17/2019   Procedure: LEFT HEART CATH AND CORONARY ANGIOGRAPHY;  Surgeon: [name], MD;  Location: [facility];  Service: Cardiovascular;  Laterality: N/A;   LEFT HEART CATHETERIZATION WITH CORONARY ANGIOGRAM N/A 11/17/2011   Procedure: LEFT HEART CATHETERIZATION WITH CORONARY ANGIOGRAM;  Surgeon: Laverda Page, MD;  Location: [facility]ATH [facility];  Service: Cardiovascular;  Laterality: N/A;   OVARIAN CYST REMOVAL     TUBAL LIGATION  2006   ULTRASOUND GUIDANCE FOR VASCULAR ACCESS  10/27/2017   Procedure: Ultrasound Guidance For Vascular Access;  Surgeon: [name], MD;  Location: [facility];  Service: Cardiovascular;;   Social History   Tobacco Use   Smoking status: Former    Packs/day: 0.50    Years: 5.00    Pack years: 2.50    Types: Cigarettes    Quit date: 10/19/1993    Years since quitting: 28.4   Smokeless tobacco: Never  Substance Use Topics   Alcohol use: No    Family History  Problem Relation Age of Onset   Hypertension Mother  Stroke Father    Heart attack Sister    Stroke Sister     Marital Status: Widowed  ROS  Review of Systems  Cardiovascular:  Positive for chest pain and dyspnea on exertion. Negative for leg swelling.  Respiratory:  Positive for cough (last 2 weeks) and wheezing (last 2 weeks).   Objective      03/10/2022    9:30 AM 03/10/2022    8:30 AM 03/10/2022    8:00 AM  Vitals with BMI  Systolic 474 259 563  Diastolic 73 74 94  Pulse 66 73 68    Blood pressure (!) 164/73, pulse 66, temperature 98.1 F (36.7 C), temperature source Oral, resp. rate 14, height '5\' 1"'$  (1.549 m), weight  108.9 kg, last menstrual period 06/16/2019, SpO2 99 %.    Physical Exam Constitutional:      Comments: Morbidly obese in no acute distress.  Neck:     Vascular: No carotid bruit or JVD.  Cardiovascular:     Rate and Rhythm: Normal rate and regular rhythm.     Pulses: Intact distal pulses.     Heart sounds: Normal heart sounds. No murmur heard.   No gallop.  Pulmonary:     Effort: Pulmonary effort is normal.     Breath sounds: Normal breath sounds.  Abdominal:     General: Bowel sounds are normal.     Palpations: Abdomen is soft.     Comments: Obese. Pannus present  Musculoskeletal:     Right lower leg: No edema.     Left lower leg: No edema.   Laboratory examination:   Recent Labs    03/10/22 0142  NA 139  K 4.0  CL 107  CO2 24  GLUCOSE 290*  BUN 15  CREATININE 0.95  CALCIUM 8.8*  GFRNONAA >60   estimated creatinine clearance is 77.2 mL/min (by C-G formula based on SCr of 0.95 mg/dL).     Latest Ref Rng & Units 03/10/2022    1:42 AM 03/02/2021    9:59 AM 11/24/2019    6:00 PM  CMP  Glucose 70 - 99 mg/dL 290   338   192    BUN 6 - 20 mg/dL '15   10   24    '$ Creatinine 0.44 - 1.00 mg/dL 0.95   0.81   1.00    Sodium 135 - 145 mmol/L 139   138   140    Potassium 3.5 - 5.1 mmol/L 4.0   3.9   4.6    Chloride 98 - 111 mmol/L 107   104   104    CO2 22 - 32 mmol/L '24   25   24    '$ Calcium 8.9 - 10.3 mg/dL 8.8   9.1   9.9        Latest Ref Rng & Units 03/10/2022    1:42 AM 03/02/2021    9:59 AM 11/24/2019    5:28 PM  CBC  WBC 4.0 - 10.5 K/uL 5.7   3.8   5.7    Hemoglobin 12.0 - 15.0 g/dL 9.8   11.3   11.3    Hematocrit 36.0 - 46.0 % 29.7   35.9   34.1    Platelets 150 - 400 K/uL 277   307   349     Lipid Panel No results for input(s): CHOL, TRIG, LDLCALC, VLDL, HDL, CHOLHDL, LDLDIRECT in the last 8760 hours.  HEMOGLOBIN A1C Lab Results  Component Value Date   HGBA1C 8.7 (  H) 03/10/2022   MPG 202.99 03/10/2022   TSH No results for input(s): TSH in the last 8760  hours. BNP (last 3 results) No results for input(s): BNP in the last 8760 hours.  Cardiac Panel (last 3 results) Recent Labs    03/10/22 0142 03/10/22 0313 03/10/22 0806  TROPONINIHS 15 28* 67*     Medications and allergies   Allergies  Allergen Reactions   Detrol [Tolterodine Tartrate]     rash     Current Meds  Medication Sig   amLODipine (NORVASC) 10 MG tablet Take 1 tablet (10 mg total) by mouth daily.   aspirin 81 MG tablet Take 81 mg by mouth every morning.    atorvastatin (LIPITOR) 40 MG tablet Take 40 mg by mouth daily.   carvedilol (COREG) 25 MG tablet Take 1 tablet (25 mg total) by mouth 2 (two) times daily.   Cholecalciferol (VITAMIN D-3) 1000 UNITS CAPS Take 1 capsule by mouth daily.    Ferrous Sulfate (IRON) 325 (65 FE) MG TABS Take 1 tablet by mouth 2 (two) times daily.     furosemide (LASIX) 40 MG tablet Take 40 mg by mouth daily.    glipiZIDE (GLUCOTROL XL) 5 MG 24 hr tablet Take 5 mg by mouth daily with breakfast.   losartan (COZAAR) 100 MG tablet Take 100 mg by mouth daily.   metFORMIN (GLUCOPHAGE) 1000 MG tablet Take 1,000 mg by mouth 2 (two) times daily with a meal.    nitroGLYCERIN (NITROSTAT) 0.4 MG SL tablet Place 1 tablet (0.4 mg total) under the tongue every 5 (five) minutes x 3 doses as needed for chest pain.   [DISCONTINUED] glipiZIDE (GLUCOTROL) 5 MG tablet Take 1 tablet by mouth daily.    Scheduled Meds:  aspirin  81 mg Oral Pre-Cath   insulin aspart  0-6 Units Subcutaneous Q6H   sodium chloride flush  3 mL Intravenous Q12H   Continuous Infusions:  sodium chloride     Followed by   sodium chloride     heparin 1,200 Units/hr (03/10/22 0531)   PRN Meds:.acetaminophen **OR** acetaminophen, nitroGLYCERIN    Radiology:   DG Chest 2 View  Result Date: 03/10/2022 CLINICAL DATA:  Chest pain. EXAM: CHEST - 2 VIEW COMPARISON:  Chest radiograph dated 03/02/2021. FINDINGS: The heart size and mediastinal contours are within normal limits. Both  lungs are clear. The visualized skeletal structures are unremarkable. IMPRESSION: No active cardiopulmonary disease. Electronically Signed   By: Anner Crete M.D.   On: 03/10/2022 01:47    Cardiac Studies:  Echocardiogram 11/16/2019:  1. Left ventricular ejection fraction, by visual estimation, is 60 to 65%. The left ventricle has normal function. Left ventricular septal wall thickness was normal. Normal left ventricular posterior wall thickness. There is no left ventricular  hypertrophy.  2. The left ventricle has no regional wall motion abnormalities.  3. Global right ventricle has normal systolic function.The right ventricular size is normal. No increase in right ventricular wall thickness.  Coronary angiography 11/17/2019: LM: Normal LAD: Diag 2 ostial 80% stenosis (unchanged since 10/2017) LCx: Ostial 40%, mid 70%, small OM 60% stenoses (unchanged since 10/2017) RCA: Prox 40% stenoses. Mid overlapping stents with no ISR 2013 and 10/28/2019 2.75 x 23 mm Xience. RPL focal 40% stenosis (unchanged since 10/2017)   Normal LVEDP  EKG:  EKG 03/02/2021: Normal sinus rhythm at rate of 80 bpm, normal axis, no evidence of ischemia, normal EKG.  EKG 03/10/2022: Normal sinus rhythm at the rate of 79 bpm, nonspecific inferior  and lateral ST segment sagging.  Early R wave progression in V2, cannot exclude posterior infarct old.  Assessment   1.  Unstable angina pectoris with elevated serum troponins and nonspecific EKG abnormalities that are new compared to previous 2.  Primary hypertension 3.  Hypercholesterolemia 4.  Diabetes mellitus type 2 with complications, hyperglycemia 5.  Morbid obesity  Recommendations:   [name] is a 56 year old African-American female patient with coronary artery disease and history of stenting to the right coronary artery in 2013 and again on 10/28/2019, doing well until last Sunday 3 days ago started having chest tightness while she was exercising.  Yesterday  while just at home, she had severe pain described as 10 out of 10 in intensity retrosternal, had to take 3 sublingual nitroglycerin and aspirin with relief and EMS was called.  She still continues to have mild chest discomfort but states that this has significantly improved compared to last night.  Chest pain was associated with worsening dyspnea over the past 2 to 3 days.  In view of high risk features including hypertension, hyperlipidemia, diabetes and obesity and now with continued uptrending serum troponin, would recommend proceeding with cardiac catheterization. Schedule for cardiac catheterization, and possible angioplasty. We discussed regarding risks, benefits, alternatives to this including stress testing, CTA and continued medical therapy. Patient wants to proceed. Understands <1-2% risk of death, stroke, MI, urgent CABG, bleeding, infection, renal failure but not limited to these.    [name], MD, [facility] 03/10/2022, 9:48 AM Office: [phone]

## 2022-03-10 NOTE — Assessment & Plan Note (Signed)
  #)   Chronic diastolic heart failure: documented history of such, along most recent echocardiogram performed in January 2021 demonstrated LVEF 60 to 65% as well as normal diastolic parameters. No clinical or radiographic evidence to suggest acutely decompensated heart failure at this time. home diuretic regimen reportedly consists of the following: Lasix 40 mg p.o. daily.   Plan: monitor strict I's & O's and daily weights. Repeat BMP in AM. Check serum mag level.  In the setting of suspected euvolemic status and cardiology request for n.p.o., will hold home Lasix for now.

## 2022-03-10 NOTE — H&P (View-Only) (Signed)
PCI to OM on 5/24 at 7:30 AM  We discussed regarding risks, benefits, alternatives to this including stress testing, CTA and continued medical therapy. Patient wants to proceed. Understands <1-2% risk of death, stroke, MI, urgent CABG, bleeding, infection, renal failure but not limited to these.    [name], MD Pager: [phone] Office: [phone]

## 2022-03-10 NOTE — Assessment & Plan Note (Signed)
 #)   Unstable angina: Patient presents with 1 episode of substernal chest pressure that resolved following sublingual nitroglycerin x3 prior to arrival in the ED, with initial high-sensitivity troponin 15, with repeat value trending up to 28, and EKG showing evidence of less than 1 mm ST depression in inferior leads, which appears new relative to most recent prior EKG from 1 year ago, while chest x-ray shows no evidence of acute cardiopulmonary process, including no evidence of pneumothorax.  Patient chest pain-free throughout ED course.  Case was discussed with on-call cardiology, Dr. Irene Limbo, Who felt that this presentation was most consistent with unstable angina.  He requested admission to the hospital service, and plans to formally consult, recommending initiation of heparin drip, will also requesting the patient be kept n.p.o. as cardiology decides whether or not to take the patient for cardiac catheterization today.  Of note, she received a full dose aspirin earlier this evening, in addition to her home high intensity atorvastatin, Coreg, and losartan.     Plan: Monitor on telemetry.  Cardiology consulted.  Heparin drip, n.p.o., per cardiology recommendations.  Continue to trend troponin.  Prn sublingual nitroglycerin ordered.  Add on serum magnesium level.  Echocardiogram ordered.

## 2022-03-10 NOTE — Assessment & Plan Note (Signed)
 #)   Chronic iron deficiency anemia: Documented history of such, associated with baseline hemoglobin range of 10-12, with presenting hemoglobin consistent with this range of presenting value noted to be 9.8.  On daily oral iron supplementation at home.  Interestingly, this evening's hemoglobin is assisted with microcytic findings, without reported history to suggest recent/active bleed.  However, in the context of the patient's presenting unstable angina, will add on iron studies to ensure no contribution towards type II process as a consequence of acute on chronic iron deficiency anemia.  On daily baby aspirin as outpatient, otherwise on no blood thinners at home.  Plan: Add on ferritin, DBC, total iron.  Check INR.  Repeat CBC tomorrow morning.

## 2022-03-10 NOTE — Interval H&P Note (Signed)
History and Physical Interval Note:  03/10/2022 11:13 AM  [name]  has presented today for surgery, with the diagnosis of chest pain.  The various methods of treatment have been discussed with the patient and family. After consideration of risks, benefits and other options for treatment, the patient has consented to  Procedure(s): LEFT HEART CATH AND CORONARY ANGIOGRAPHY (N/A) and possible angioplasty as a surgical intervention.  The patient's history has been reviewed, patient examined, no change in status, stable for surgery.  I have reviewed the patient's chart and labs.  Questions were answered to the patient's satisfaction.   Cath Lab Visit (complete for each Cath Lab visit)  Clinical Evaluation Leading to the Procedure:   ACS: Yes.    Non-ACS:    Anginal Classification: CCS IV  Anti-ischemic medical therapy: Maximal Therapy (2 or more classes of medications)  Non-Invasive Test Results: No non-invasive testing performed  Prior CABG: No previous CABG   [name]

## 2022-03-10 NOTE — H&P (View-Only) (Signed)
CARDIOLOGY CONSULT NOTE  Patient ID: [name] MRN: [id number] DOB/AGE: [birth year] 56 y.o.  Admit date: 03/10/2022 Referring Physician  Holli Humbles Primary Physician:  Trey Sailors, [place] Reason for Consultation  Unstable angina  Patient ID: [name], female    DOB: 1966-09-05, 56 y.o.   MRN: [id number]  Chief Complaint  Patient presents with   Chest Pain    Chest pain that began this morning and improved with two  SL nitro.    HPI:    [name]  is a 56 y.o. African-American female patient with coronary artery disease and history of stenting to the right coronary artery in 2013 and again on 10/28/2019, doing well until last Sunday 3 days ago started having chest tightness while she was exercising.  Yesterday while just at home, she had severe pain described as 10 out of 10 in intensity retrosternal, had to take 3 sublingual nitroglycerin and aspirin with relief and EMS was called.  She still continues to have mild chest discomfort but states that this has significantly improved compared to last night.  Chest pain was associated with worsening dyspnea over the past 2 to 3 days.  Past Medical History:  Diagnosis Date   Anemia    CAD (coronary artery disease)    PCI with stent to RCA in 2019   CHF (congestive heart failure) ([place]) 04/2011   EF 15-20% from echo 05/19/11   DOE (dyspnea on exertion)    DVT (deep venous thrombosis) ([place]) 06/2011   LLE   Fatigue    HTN (hypertension)    Menorrhagia    with iron deficient anemia   NSTEMI (non-ST elevated myocardial infarction) ([place]) 10/26/2017   NSTEMI (non-ST elevated myocardial infarction) ([place]) 10/27/2017   Obesity    Orthopnea    Type II diabetes mellitus ([place])    Past Surgical History:  Procedure Laterality Date   CORONARY ANGIOPLASTY WITH STENT PLACEMENT  2013;  10/27/2017   CORONARY STENT INTERVENTION N/A 10/27/2017   Procedure: CORONARY STENT INTERVENTION;  Surgeon: [name], MD;   Location: [facility];  Service: Cardiovascular;  Laterality: N/A;   LEFT HEART CATH AND CORONARY ANGIOGRAPHY N/A 10/27/2017   Procedure: LEFT HEART CATH AND CORONARY ANGIOGRAPHY;  Surgeon: [name], MD;  Location: [facility];  Service: Cardiovascular;  Laterality: N/A;   LEFT HEART CATH AND CORONARY ANGIOGRAPHY N/A 11/17/2019   Procedure: LEFT HEART CATH AND CORONARY ANGIOGRAPHY;  Surgeon: [name], MD;  Location: [facility];  Service: Cardiovascular;  Laterality: N/A;   LEFT HEART CATHETERIZATION WITH CORONARY ANGIOGRAM N/A 11/17/2011   Procedure: LEFT HEART CATHETERIZATION WITH CORONARY ANGIOGRAM;  Surgeon: Laverda Page, MD;  Location: [facility];  Service: Cardiovascular;  Laterality: N/A;   OVARIAN CYST REMOVAL     TUBAL LIGATION  2006   ULTRASOUND GUIDANCE FOR VASCULAR ACCESS  10/27/2017   Procedure: Ultrasound Guidance For Vascular Access;  Surgeon: [name], MD;  Location: [facility];  Service: Cardiovascular;;   Social History   Tobacco Use   Smoking status: Former    Packs/day: 0.50    Years: 5.00    Pack years: 2.50    Types: Cigarettes    Quit date: 10/19/1993    Years since quitting: 28.4   Smokeless tobacco: Never  Substance Use Topics   Alcohol use: No    Family History  Problem Relation Age of Onset   Hypertension Mother  Stroke Father    Heart attack Sister    Stroke Sister     Marital Status: Widowed  ROS  Review of Systems  Cardiovascular:  Positive for chest pain and dyspnea on exertion. Negative for leg swelling.  Respiratory:  Positive for cough (last 2 weeks) and wheezing (last 2 weeks).   Objective      03/10/2022    9:30 AM 03/10/2022    8:30 AM 03/10/2022    8:00 AM  Vitals with BMI  Systolic 672 094 709  Diastolic 73 74 94  Pulse 66 73 68    Blood pressure (!) 164/73, pulse 66, temperature 98.1 F (36.7 C), temperature source Oral, resp. rate 14, height '5\' 1"'$  (1.549 m), weight  108.9 kg, last menstrual period 06/16/2019, SpO2 99 %.    Physical Exam Constitutional:      Comments: Morbidly obese in no acute distress.  Neck:     Vascular: No carotid bruit or JVD.  Cardiovascular:     Rate and Rhythm: Normal rate and regular rhythm.     Pulses: Intact distal pulses.     Heart sounds: Normal heart sounds. No murmur heard.   No gallop.  Pulmonary:     Effort: Pulmonary effort is normal.     Breath sounds: Normal breath sounds.  Abdominal:     General: Bowel sounds are normal.     Palpations: Abdomen is soft.     Comments: Obese. Pannus present  Musculoskeletal:     Right lower leg: No edema.     Left lower leg: No edema.   Laboratory examination:   Recent Labs    03/10/22 0142  NA 139  K 4.0  CL 107  CO2 24  GLUCOSE 290*  BUN 15  CREATININE 0.95  CALCIUM 8.8*  GFRNONAA >60   estimated creatinine clearance is 77.2 mL/min (by C-G formula based on SCr of 0.95 mg/dL).     Latest Ref Rng & Units 03/10/2022    1:42 AM 03/02/2021    9:59 AM 11/24/2019    6:00 PM  CMP  Glucose 70 - 99 mg/dL 290   338   192    BUN 6 - 20 mg/dL '15   10   24    '$ Creatinine 0.44 - 1.00 mg/dL 0.95   0.81   1.00    Sodium 135 - 145 mmol/L 139   138   140    Potassium 3.5 - 5.1 mmol/L 4.0   3.9   4.6    Chloride 98 - 111 mmol/L 107   104   104    CO2 22 - 32 mmol/L '24   25   24    '$ Calcium 8.9 - 10.3 mg/dL 8.8   9.1   9.9        Latest Ref Rng & Units 03/10/2022    1:42 AM 03/02/2021    9:59 AM 11/24/2019    5:28 PM  CBC  WBC 4.0 - 10.5 K/uL 5.7   3.8   5.7    Hemoglobin 12.0 - 15.0 g/dL 9.8   11.3   11.3    Hematocrit 36.0 - 46.0 % 29.7   35.9   34.1    Platelets 150 - 400 K/uL 277   307   349     Lipid Panel No results for input(s): CHOL, TRIG, LDLCALC, VLDL, HDL, CHOLHDL, LDLDIRECT in the last 8760 hours.  HEMOGLOBIN A1C Lab Results  Component Value Date   HGBA1C 8.7 (  H) 03/10/2022   MPG 202.99 03/10/2022   TSH No results for input(s): TSH in the last 8760  hours. BNP (last 3 results) No results for input(s): BNP in the last 8760 hours.  Cardiac Panel (last 3 results) Recent Labs    03/10/22 0142 03/10/22 0313 03/10/22 0806  TROPONINIHS 15 28* 67*     Medications and allergies   Allergies  Allergen Reactions   Detrol [Tolterodine Tartrate]     rash     Current Meds  Medication Sig   amLODipine (NORVASC) 10 MG tablet Take 1 tablet (10 mg total) by mouth daily.   aspirin 81 MG tablet Take 81 mg by mouth every morning.    atorvastatin (LIPITOR) 40 MG tablet Take 40 mg by mouth daily.   carvedilol (COREG) 25 MG tablet Take 1 tablet (25 mg total) by mouth 2 (two) times daily.   Cholecalciferol (VITAMIN D-3) 1000 UNITS CAPS Take 1 capsule by mouth daily.    Ferrous Sulfate (IRON) 325 (65 FE) MG TABS Take 1 tablet by mouth 2 (two) times daily.     furosemide (LASIX) 40 MG tablet Take 40 mg by mouth daily.    glipiZIDE (GLUCOTROL XL) 5 MG 24 hr tablet Take 5 mg by mouth daily with breakfast.   losartan (COZAAR) 100 MG tablet Take 100 mg by mouth daily.   metFORMIN (GLUCOPHAGE) 1000 MG tablet Take 1,000 mg by mouth 2 (two) times daily with a meal.    nitroGLYCERIN (NITROSTAT) 0.4 MG SL tablet Place 1 tablet (0.4 mg total) under the tongue every 5 (five) minutes x 3 doses as needed for chest pain.   [DISCONTINUED] glipiZIDE (GLUCOTROL) 5 MG tablet Take 1 tablet by mouth daily.    Scheduled Meds:  aspirin  81 mg Oral Pre-Cath   insulin aspart  0-6 Units Subcutaneous Q6H   sodium chloride flush  3 mL Intravenous Q12H   Continuous Infusions:  sodium chloride     Followed by   sodium chloride     heparin 1,200 Units/hr (03/10/22 0531)   PRN Meds:.acetaminophen **OR** acetaminophen, nitroGLYCERIN    Radiology:   DG Chest 2 View  Result Date: 03/10/2022 CLINICAL DATA:  Chest pain. EXAM: CHEST - 2 VIEW COMPARISON:  Chest radiograph dated 03/02/2021. FINDINGS: The heart size and mediastinal contours are within normal limits. Both  lungs are clear. The visualized skeletal structures are unremarkable. IMPRESSION: No active cardiopulmonary disease. Electronically Signed   By: Anner Crete M.D.   On: 03/10/2022 01:47    Cardiac Studies:  Echocardiogram 11/16/2019:  1. Left ventricular ejection fraction, by visual estimation, is 60 to 65%. The left ventricle has normal function. Left ventricular septal wall thickness was normal. Normal left ventricular posterior wall thickness. There is no left ventricular  hypertrophy.  2. The left ventricle has no regional wall motion abnormalities.  3. Global right ventricle has normal systolic function.The right ventricular size is normal. No increase in right ventricular wall thickness.  Coronary angiography 11/17/2019: LM: Normal LAD: Diag 2 ostial 80% stenosis (unchanged since 10/2017) LCx: Ostial 40%, mid 70%, small OM 60% stenoses (unchanged since 10/2017) RCA: Prox 40% stenoses. Mid overlapping stents with no ISR 2013 and 10/28/2019 2.75 x 23 mm Xience. RPL focal 40% stenosis (unchanged since 10/2017)   Normal LVEDP  EKG:  EKG 03/02/2021: Normal sinus rhythm at rate of 80 bpm, normal axis, no evidence of ischemia, normal EKG.  EKG 03/10/2022: Normal sinus rhythm at the rate of 79 bpm, nonspecific inferior  and lateral ST segment sagging.  Early R wave progression in V2, cannot exclude posterior infarct old.  Assessment   1.  Unstable angina pectoris with elevated serum troponins and nonspecific EKG abnormalities that are new compared to previous 2.  Primary hypertension 3.  Hypercholesterolemia 4.  Diabetes mellitus type 2 with complications, hyperglycemia 5.  Morbid obesity  Recommendations:   Ms. Jenaya Saar is a 56 year old African-American female patient with coronary artery disease and history of stenting to the right coronary artery in 2013 and again on 10/28/2019, doing well until last Sunday 3 days ago started having chest tightness while she was exercising.  Yesterday  while just at home, she had severe pain described as 10 out of 10 in intensity retrosternal, had to take 3 sublingual nitroglycerin and aspirin with relief and EMS was called.  She still continues to have mild chest discomfort but states that this has significantly improved compared to last night.  Chest pain was associated with worsening dyspnea over the past 2 to 3 days.  In view of high risk features including hypertension, hyperlipidemia, diabetes and obesity and now with continued uptrending serum troponin, would recommend proceeding with cardiac catheterization. Schedule for cardiac catheterization, and possible angioplasty. We discussed regarding risks, benefits, alternatives to this including stress testing, CTA and continued medical therapy. Patient wants to proceed. Understands <1-2% risk of death, stroke, MI, urgent CABG, bleeding, infection, renal failure but not limited to these.    [name], MD, [facility] 03/10/2022, 9:48 AM Office: [phone]

## 2022-03-10 NOTE — ED Provider Notes (Signed)
[place] EMERGENCY DEPARTMENT Provider Note   CSN: [id number] Arrival date & time: 03/10/22  0053     History  Chief Complaint  Patient presents with   Chest Pain    Chest pain that began this morning and improved with two  SL nitro.     [name] is a 56 y.o. female.  The history is provided by the patient and medical records.  Chest Pain [name] is a 56 y.o. female who presents to the Emergency Department complaining of chest pain.  She presents to the ED by EMS for evaluation of chest pain.  She had mild chest pain yesterday.  Earlier today she felt well.  Later this evening she developed severe central/left sided chest pain.  Felt like her prior heart attack.  Couldn't lie down.  Had associated central upper back pain.  Pain lasted until EMS arrived.  Took 325 ASA prior to EMS arrival, nitroglycerin x3.    Has cough for the last month.  No hemoptysis.  No sob - had sob earlier.  No diaphoresis. Has nausea.  No fever, V, abdominal pain.   No leg swelling pain.   She is compliant with her medications.  Home Medications Prior to Admission medications   Medication Sig Start Date End Date Taking? Authorizing Provider  amLODipine (NORVASC) 10 MG tablet Take 1 tablet (10 mg total) by mouth daily. 11/22/19 08/19/20  Miquel Dunn, NP  aspirin 81 MG tablet Take 81 mg by mouth every morning.     [provider]  atorvastatin (LIPITOR) 40 MG tablet Take 40 mg by mouth daily. 12/06/16   [provider]  carvedilol (COREG) 25 MG tablet Take 1 tablet (25 mg total) by mouth 2 (two) times daily. 11/03/17 08/19/20  Patwardhan, Reynold Bowen, MD  Cholecalciferol (VITAMIN D-3) 1000 UNITS CAPS Take 1 capsule by mouth daily.     [provider]  Ferrous Sulfate (IRON) 325 (65 FE) MG TABS Take 1 tablet by mouth 2 (two) times daily.      [provider]  furosemide (LASIX) 40 MG tablet Take 40 mg by mouth daily.     [provider]  losartan (COZAAR) 100 MG tablet Take 100 mg by mouth daily. 10/21/19   [provider]  metFORMIN (GLUCOPHAGE) 1000 MG tablet Take 1,000 mg by mouth 2 (two) times daily with a meal.     [provider]  nitroGLYCERIN (NITROSTAT) 0.4 MG SL tablet Place 1 tablet (0.4 mg total) under the tongue every 5 (five) minutes x 3 doses as needed for chest pain. 10/28/17   Patwardhan, Reynold Bowen, MD  ondansetron (ZOFRAN ODT) 4 MG disintegrating tablet Take 1 tablet (4 mg total) by mouth every 8 (eight) hours as needed for nausea or vomiting. 02/07/17   Nona Dell, PA-C  pantoprazole (PROTONIX) 40 MG tablet Take 1 tablet (40 mg total) by mouth daily. 11/17/19 11/16/20  Patwardhan, Reynold Bowen, MD  TRULICITY 3.24 MW/1.0UV SOPN Inject 0.75 mg into the skin once a week. 06/26/20   [provider]      Allergies    Detrol [tolterodine tartrate]    Review of Systems   Review of Systems  Cardiovascular:  Positive for chest pain.  All other systems reviewed and are negative.  Physical Exam Updated Vital Signs BP (!) 164/68   Pulse 89   Temp 98.1 F (36.7 C) (Oral)   Resp (!) 24   Ht '5\' 1"'$  (  1.549 m)   Wt 108.9 kg   LMP 06/16/2019   SpO2 98%   BMI 45.35 kg/m  Physical Exam Vitals and nursing note reviewed.  Constitutional:      Appearance: She is well-developed.  HENT:     Head: Normocephalic and atraumatic.  Cardiovascular:     Rate and Rhythm: Normal rate and regular rhythm.     Heart sounds: No murmur heard. Pulmonary:     Effort: Pulmonary effort is normal. No respiratory distress.     Breath sounds: Normal breath sounds.  Abdominal:     Palpations: Abdomen is soft.     Tenderness: There is no abdominal tenderness. There is no guarding or rebound.  Musculoskeletal:        General: No swelling or tenderness.  Skin:    General: Skin is warm and dry.  Neurological:     Mental Status: She is alert and oriented to person, place, and time.   Psychiatric:        Behavior: Behavior normal.    ED Results / Procedures / Treatments   Labs (all labs ordered are listed, but only abnormal results are displayed) Labs Reviewed  BASIC METABOLIC PANEL - Abnormal; Notable for the following components:      Result Value   Glucose, Bld 290 (*)    Calcium 8.8 (*)    All other components within normal limits  CBC - Abnormal; Notable for the following components:   Hemoglobin 9.8 (*)    HCT 29.7 (*)    MCV 74.3 (*)    MCH 24.5 (*)    All other components within normal limits  TROPONIN I (HIGH SENSITIVITY) - Abnormal; Notable for the following components:   Troponin I (High Sensitivity) 28 (*)    All other components within normal limits  I-STAT BETA HCG BLOOD, ED ([facility], [facility], [facility] ONLY)  TROPONIN I (HIGH SENSITIVITY)    EKG EKG Interpretation  Date/Time:  Tuesday Mar 10 2022 01:07:21 EDT Ventricular Rate:  79 PR Interval:  130 QRS Duration: 82 QT Interval:  375 QTC Calculation: 430 R Axis:   42 Text Interpretation: Sinus rhythm Posterior infarct, old Minimal ST depression, inferior leads Confirmed by Quintella Reichert [id number]) on 03/10/2022 1:09:18 AM  Radiology DG Chest 2 View  Result Date: 03/10/2022 CLINICAL DATA:  Chest pain. EXAM: CHEST - 2 VIEW COMPARISON:  Chest radiograph dated 03/02/2021. FINDINGS: The heart size and mediastinal contours are within normal limits. Both lungs are clear. The visualized skeletal structures are unremarkable. IMPRESSION: No active cardiopulmonary disease. Electronically Signed   By: Anner Crete M.D.   On: 03/10/2022 01:47    Procedures Procedures    Medications Ordered in ED Medications - No data to display  ED Course/ Medical Decision Making/ A&P                           Medical Decision Making Amount and/or Complexity of Data Reviewed Labs: ordered. Radiology: ordered.  Risk Decision regarding hospitalization.  Patient with history of coronary artery disease here for  evaluation of chest pain, similar to her prior cardiac event.  EKG with subtle ST depressions in the inferior leads.  Initial troponin is negative.  Patient is pain-free throughout her ED stay.  Repeat troponin did increase to 28.  Labs with stable renal function.  CBC with mild anemia-history of anemia in the past.  No reports of active bleeding at home.  Given her cardiac history Case  discussed with Dr. Einar Gip, cardiologist on-call.  He recommends admission to the medicine service and for patient to be kept n.p.o. for potential cath later today.  She was started on heparin drip for possible NSTEMI.  Medicine consulted for admission for ongoing treatment. Patient updated of findings of studies and recommendation for admission.       Final Clinical Impression(s) / ED Diagnoses Final diagnoses:  None    Rx / DC Orders ED Discharge Orders     None         Quintella Reichert, MD 03/10/22 [phone]

## 2022-03-11 ENCOUNTER — Encounter (HOSPITAL_COMMUNITY): Payer: Self-pay | Admitting: Cardiology

## 2022-03-11 ENCOUNTER — Inpatient Hospital Stay (HOSPITAL_COMMUNITY)
Admission: EM | Disposition: A | Discharge status: Home / Self Care | Payer: Self-pay | Source: Home / Self Care | Attending: Family Medicine

## 2022-03-11 HISTORY — PX: CORONARY STENT INTERVENTION: CATH118234

## 2022-03-11 HISTORY — PX: LEFT HEART CATH: CATH118248

## 2022-03-11 LAB — ECHOCARDIOGRAM COMPLETE
AR max vel: 1.55 cm2
AV Area VTI: 1.45 cm2
AV Area mean vel: 1.53 cm2
AV Mean grad: 9 mmHg
AV Peak grad: 16 mmHg
Ao pk vel: 2 m/s
Height: 61 in
MV VTI: 1.77 cm2
S' Lateral: 3 cm
Weight: 3840 oz

## 2022-03-11 LAB — CBC
HCT: 30.8 % — ABNORMAL LOW (ref 36.0–46.0)
Hemoglobin: 10.3 g/dL — ABNORMAL LOW (ref 12.0–15.0)
MCH: 24.6 pg — ABNORMAL LOW (ref 26.0–34.0)
MCHC: 33.4 g/dL (ref 30.0–36.0)
MCV: 73.5 fL — ABNORMAL LOW (ref 80.0–100.0)
Platelets: 297 10*3/uL (ref 150–400)
RBC: 4.19 MIL/uL (ref 3.87–5.11)
RDW: 13.8 % (ref 11.5–15.5)
WBC: 6.1 10*3/uL (ref 4.0–10.5)
nRBC: 0 % (ref 0.0–0.2)

## 2022-03-11 LAB — GLUCOSE, CAPILLARY
Glucose-Capillary: 119 mg/dL — ABNORMAL HIGH (ref 70–99)
Glucose-Capillary: 152 mg/dL — ABNORMAL HIGH (ref 70–99)
Glucose-Capillary: 188 mg/dL — ABNORMAL HIGH (ref 70–99)

## 2022-03-11 LAB — BASIC METABOLIC PANEL
Anion gap: 8 (ref 5–15)
BUN: 10 mg/dL (ref 6–20)
CO2: 21 mmol/L — ABNORMAL LOW (ref 22–32)
Calcium: 9 mg/dL (ref 8.9–10.3)
Chloride: 111 mmol/L (ref 98–111)
Creatinine, Ser: 0.8 mg/dL (ref 0.44–1.00)
GFR, Estimated: >60 mL/min (ref >60)
Glucose, Bld: 121 mg/dL — ABNORMAL HIGH (ref 70–99)
Potassium: 3.8 mmol/L (ref 3.5–5.1)
Sodium: 140 mmol/L (ref 135–145)

## 2022-03-11 LAB — POCT ACTIVATED CLOTTING TIME
Activated Clotting Time: 378 seconds
Activated Clotting Time: 311 seconds

## 2022-03-11 SURGERY — CORONARY STENT INTERVENTION
Anesthesia: LOCAL

## 2022-03-11 MED ORDER — VERAPAMIL HCL 2.5 MG/ML IV SOLN
INTRAVENOUS | Status: AC
  Filled 2022-03-11: qty 2

## 2022-03-11 MED ORDER — ISOSORB DINITRATE-HYDRALAZINE 20-37.5 MG PO TABS
1.0000 | ORAL_TABLET | Freq: Three times a day (TID) | ORAL | Status: DC
  Administered 2022-03-11 – 2022-03-12 (×5): 1 via ORAL
  Filled 2022-03-11 (×5): qty 1

## 2022-03-11 MED ORDER — HEPARIN SODIUM (PORCINE) 1000 UNIT/ML IJ SOLN
INTRAMUSCULAR | Status: DC | PRN
  Administered 2022-03-11: 10000 [IU] via INTRAVENOUS

## 2022-03-11 MED ORDER — LIDOCAINE HCL (PF) 1 % IJ SOLN
INTRAMUSCULAR | Status: DC | PRN
Start: 2022-03-11 — End: 2022-03-11
  Administered 2022-03-11: 2 mL

## 2022-03-11 MED ORDER — SODIUM CHLORIDE 0.9% FLUSH
3.0000 mL | INTRAVENOUS | Status: DC | PRN
Start: 2022-03-11 — End: 2022-03-13

## 2022-03-11 MED ORDER — NITROGLYCERIN 1 MG/10 ML FOR IR/CATH LAB
INTRA_ARTERIAL | Status: AC
  Filled 2022-03-11: qty 10

## 2022-03-11 MED ORDER — ACETAMINOPHEN 325 MG PO TABS: 650.0000 mg | ORAL_TABLET | ORAL | Status: DC | PRN

## 2022-03-11 MED ORDER — ACETAMINOPHEN 650 MG RE SUPP: 650.0000 mg | RECTAL | Status: DC | PRN

## 2022-03-11 MED ORDER — NITROGLYCERIN 1 MG/10 ML FOR IR/CATH LAB
INTRA_ARTERIAL | Status: DC | PRN
  Administered 2022-03-11: 200 ug via INTRA_ARTERIAL

## 2022-03-11 MED ORDER — LABETALOL HCL 5 MG/ML IV SOLN
INTRAVENOUS | Status: AC
  Filled 2022-03-11: qty 4

## 2022-03-11 MED ORDER — IOHEXOL 350 MG/ML SOLN
INTRAVENOUS | Status: DC | PRN
  Administered 2022-03-11: 125 mL

## 2022-03-11 MED ORDER — ACETAMINOPHEN 325 MG PO TABS
650.0000 mg | ORAL_TABLET | ORAL | Status: DC | PRN
  Administered 2022-03-11: 650 mg via ORAL
  Filled 2022-03-11: qty 2

## 2022-03-11 MED ORDER — HYDRALAZINE HCL 20 MG/ML IJ SOLN: 10.0000 mg | INTRAMUSCULAR | Status: AC | PRN

## 2022-03-11 MED ORDER — MIDAZOLAM HCL 2 MG/2ML IJ SOLN
INTRAMUSCULAR | Status: AC
  Filled 2022-03-11: qty 2

## 2022-03-11 MED ORDER — HEPARIN (PORCINE) IN NACL 1000-0.9 UT/500ML-% IV SOLN
INTRAVENOUS | Status: AC
  Filled 2022-03-11: qty 1000

## 2022-03-11 MED ORDER — FENTANYL CITRATE (PF) 100 MCG/2ML IJ SOLN
INTRAMUSCULAR | Status: AC
  Filled 2022-03-11: qty 2

## 2022-03-11 MED ORDER — LABETALOL HCL 5 MG/ML IV SOLN: 10.0000 mg | INTRAVENOUS | Status: AC | PRN

## 2022-03-11 MED ORDER — HEPARIN (PORCINE) IN NACL 1000-0.9 UT/500ML-% IV SOLN
INTRAVENOUS | Status: DC | PRN
  Administered 2022-03-11 (×2): 500 mL

## 2022-03-11 MED ORDER — SODIUM CHLORIDE 0.9% FLUSH
3.0000 mL | Freq: Two times a day (BID) | INTRAVENOUS | Status: DC
  Administered 2022-03-11 – 2022-03-12 (×3): 3 mL via INTRAVENOUS

## 2022-03-11 MED ORDER — SODIUM CHLORIDE 0.9 % IV SOLN: INTRAVENOUS | Status: AC

## 2022-03-11 MED ORDER — ONDANSETRON HCL 4 MG/2ML IJ SOLN: 4.0000 mg | Freq: Four times a day (QID) | INTRAMUSCULAR | Status: DC | PRN

## 2022-03-11 MED ORDER — LABETALOL HCL 5 MG/ML IV SOLN
INTRAVENOUS | Status: DC | PRN
  Administered 2022-03-11 (×2): 10 mg via INTRAVENOUS

## 2022-03-11 MED ORDER — LIDOCAINE HCL (PF) 1 % IJ SOLN
INTRAMUSCULAR | Status: AC
  Filled 2022-03-11: qty 30

## 2022-03-11 MED ORDER — SODIUM CHLORIDE 0.9 % IV SOLN: 250.0000 mL | INTRAVENOUS | Status: DC | PRN

## 2022-03-11 MED ORDER — VERAPAMIL HCL 2.5 MG/ML IV SOLN
INTRAVENOUS | Status: DC | PRN
  Administered 2022-03-11 (×2): 10 mL via INTRA_ARTERIAL

## 2022-03-11 MED ORDER — MIDAZOLAM HCL 2 MG/2ML IJ SOLN
INTRAMUSCULAR | Status: DC | PRN
  Administered 2022-03-11 (×2): 1 mg via INTRAVENOUS

## 2022-03-11 MED ORDER — HEPARIN SODIUM (PORCINE) 1000 UNIT/ML IJ SOLN
INTRAMUSCULAR | Status: AC
  Filled 2022-03-11: qty 10

## 2022-03-11 MED ORDER — HEPARIN SODIUM (PORCINE) 5000 UNIT/ML IJ SOLN
5000.0000 [IU] | Freq: Three times a day (TID) | INTRAMUSCULAR | Status: DC
  Administered 2022-03-12 (×2): 5000 [IU] via SUBCUTANEOUS
  Filled 2022-03-11 (×2): qty 1

## 2022-03-11 MED ORDER — FENTANYL CITRATE (PF) 100 MCG/2ML IJ SOLN
INTRAMUSCULAR | Status: DC | PRN
  Administered 2022-03-11: 50 ug via INTRAVENOUS
  Administered 2022-03-11 (×3): 25 ug via INTRAVENOUS

## 2022-03-11 SURGICAL SUPPLY — 24 items
BALLN SAPPHIRE 2.0X15 (BALLOONS) ×2
BALLOON SAPPHIRE 2.0X15 (BALLOONS) IMPLANT
BAND ZEPHYR COMPRESS 30 LONG (HEMOSTASIS) ×1 IMPLANT
CATH LAUNCHER 6FR AL.75 (CATHETERS) ×1 IMPLANT
CATH LAUNCHER 6FR AL1 (CATHETERS) IMPLANT
CATH LAUNCHER 6FR EBU 3 (CATHETERS) ×1 IMPLANT
CATH LAUNCHER 6FR EBU3.5 (CATHETERS) ×1 IMPLANT
CATH LAUNCHER 6FR JL3 (CATHETERS) ×1 IMPLANT
CATHETER LAUNCHER 6FR AL1 (CATHETERS) ×2
CATHETER LAUNCHER 6FR LCB (CATHETERS) ×1 IMPLANT
ELECT DEFIB PAD ADLT CADENCE (PAD) ×1 IMPLANT
GLIDESHEATH SLEND A-KIT 6F 22G (SHEATH) ×1 IMPLANT
GUIDEWIRE INQWIRE 1.5J.035X260 (WIRE) IMPLANT
INQWIRE 1.5J .035X260CM (WIRE) ×2
KIT ENCORE 26 ADVANTAGE (KITS) ×1 IMPLANT
KIT HEART LEFT (KITS) ×2 IMPLANT
KIT HEMO VALVE WATCHDOG (MISCELLANEOUS) ×1 IMPLANT
PACK CARDIAC CATHETERIZATION (CUSTOM PROCEDURE TRAY) ×2 IMPLANT
SHEATH 6FR 75 DEST SLENDER (SHEATH) ×1 IMPLANT
SHEATH PINNACLE 6F 10CM (SHEATH) ×1 IMPLANT
SHEATH PROBE COVER 6X72 (BAG) ×1 IMPLANT
TRANSDUCER W/STOPCOCK (MISCELLANEOUS) ×2 IMPLANT
TUBING CIL FLEX 10 FLL-RA (TUBING) ×2 IMPLANT
WIRE ASAHI PROWATER 180CM (WIRE) ×1 IMPLANT

## 2022-03-11 NOTE — CV Procedure (Signed)
Unable to engage any guide through radial access. Procedure aborted. Continue medical management, especially blood pressure control. If BP controlled and no chest pain, okay to discharge later today. Will arrange outpatient follow up.   [name], MD Pager: [phone] Office: [phone]

## 2022-03-11 NOTE — Progress Notes (Signed)
PROGRESS NOTE    [name]  HXT:[id number] DOB: 1966/07/07 DOA: 03/10/2022 PCP: Trey Sailors, PA    Brief Narrative: This 56 y.o. female with medical history significant for coronary artery disease status post NSTEMI in 2019 at which time she underwent PCI with stent placement to the RCA, essential pretension, type 2 diabetes mellitus, chronic iron deficiency anemia associated baseline hemoglobin 10-12, who is admitted to [facility] on 03/10/2022 with unstable angina.  Cardiology consulted.  Patient underwent left heart cath, found to have very large OM obstruction, cardiology recommended dual antiplatelet therapy for 1 year, elective stage intervention to a very large OM1 today.  Unable to engage any guide through radial access.  So procedure was aborted.  Cardiologist recommended continue medical management.  Patient will need outpatient PCI through femoral access.  Assessment & Plan:   Principal Problem:   Unstable angina (HCC) Active Problems:   Essential hypertension   Non-ST elevation (NSTEMI) myocardial infarction (HCC)   Chronic iron deficiency anemia   Chronic diastolic CHF (congestive heart failure) (HCC)   DM2 (diabetes mellitus, type 2) ([place])   Unstable angina with significant CAD history: Cardiology following.  Patient underwent left heart cath 5/23 Patient found to have  OM1 obstruction. Patient underwent electively staged intervention for very large OM1, but unable to engage any guide through radial access.  So procedure was aborted. Cardiology recommended medical management.  Plan is outpatient PCI through femoral access Continue cardioprotective medications.  aspirin, statin, carvedilol, furosemide, losartan at home) Recommended dual antiplatelet agents aspirin and Brilinta for a year.   Chronic systolic HF without exacerbation Appears euvolemic on exam. Cardiology following, on carvedilol, furosemide, losartan at home.   Essential  hypertension: Continue home blood pressure medications.   IDDM2 uncontrolled with hyperglycemia -A1c 8.7 -Continue sliding scale insulin, hypoglycemic protocol.   Presumed bronchitis: Possible pulmonary edema vs cardiogenic wheeze Procalcitonin negative, without fever or leukocytosis .  Antibiotics discontinued. Patient without hypoxia or respiratory symptoms at this point Chest x-ray without overt infiltrate or findings, questionable hazy bilateral pulmonary edema noted compared to prior imaging in February per personal review Does not meet sepsis criteria Continue to follow clinically, given acute onset concerning for more central etiology including flash pulmonary edema, possible aspiration pneumonitis, or bronchospasm.   Anemia likely multifactorial. -At baseline, follow repeat labs no signs or symptoms of bleeding   DVT prophylaxis: (Heparin subcu Code Status: Full code Family Communication: No family at bedside Disposition Plan:   Status is: Inpatient Remains inpatient appropriate because: Admitted for unstable angina underwent left heart cath found to have OM1 blockage requiring elective restaging PCI which was unsuccessful through radial access.  Cardiology recommended medical management   Consultants:  Cardiology  Procedures: Left heart cath. Antimicrobials: None  Subjective: Patient was seen and examined at bedside.  Overnight events noted.   Patient reports feeling better, was not sure whether she wants to be discharged today. Stated she is feeling very weak, not comfortable being discharged today.  Objective: Vitals:   03/11/22 0903 03/11/22 0908 03/11/22 0913 03/11/22 0918  BP: (!) 191/96 (!) 187/106 (!) 170/98 (!) 178/94  Pulse: 93 87 84 85  Resp: 20 (!) 24 (!) 21 (!) 21  Temp:      TempSrc:      SpO2: 100% 99% 98% 97%  Weight:      Height:        Intake/Output Summary (Last 24 hours) at 03/11/2022 1557 Last data filed at 03/11/2022 1655 Gross  per 24  hour  Intake 733.26 ml  Output 2000 ml  Net -1266.74 ml   Filed Weights   03/10/22 0112  Weight: 108.9 kg    Examination:  General exam: Appears comfortable, not in any acute distress. Respiratory system: CTA bilaterally, no wheezing, no crackles, normal respiratory effort. Cardiovascular system: S1 & S2 heard, regular rate and rhythm, no murmur. Gastrointestinal system: Abdomen is soft, non tender, non distended, BS+ Central nervous system: Alert and oriented x 3. No focal neurological deficits. Extremities: No edema, no cyanosis, no clubbing. Skin: No rashes, lesions or ulcers Psychiatry: Judgement and insight appear normal. Mood & affect appropriate.     Data Reviewed: I have personally reviewed following labs and imaging studies  CBC: Recent Labs  Lab 03/10/22 0142 03/11/22 0150  WBC 5.7 6.1  HGB 9.8* 10.3*  HCT 29.7* 30.8*  MCV 74.3* 73.5*  PLT 277 297   Basic Metabolic Panel: Recent Labs  Lab 03/10/22 0142 03/10/22 0507 03/11/22 0150  NA 139  --  140  K 4.0  --  3.8  CL 107  --  111  CO2 24  --  21*  GLUCOSE 290*  --  121*  BUN 15  --  10  CREATININE 0.95  --  0.80  CALCIUM 8.8*  --  9.0  MG  --  2.0  --    GFR: Estimated Creatinine Clearance: 91.6 mL/min (by C-G formula based on SCr of 0.8 mg/dL). Liver Function Tests: No results for input(s): AST, ALT, ALKPHOS, BILITOT, PROT, ALBUMIN in the last 168 hours. No results for input(s): LIPASE, AMYLASE in the last 168 hours. No results for input(s): AMMONIA in the last 168 hours. Coagulation Profile: Recent Labs  Lab 03/10/22 0507  INR 1.0   Cardiac Enzymes: No results for input(s): CKTOTAL, CKMB, CKMBINDEX, TROPONINI in the last 168 hours. BNP (last 3 results) No results for input(s): PROBNP in the last 8760 hours. HbA1C: Recent Labs    03/10/22 0454  HGBA1C 8.7*   CBG: Recent Labs  Lab 03/10/22 0534 03/10/22 1719 03/10/22 2121 03/11/22 0154 03/11/22 0618  GLUCAP 190* 205* 182* 119*  152*   Lipid Profile: No results for input(s): CHOL, HDL, LDLCALC, TRIG, CHOLHDL, LDLDIRECT in the last 72 hours. Thyroid Function Tests: No results for input(s): TSH, T4TOTAL, FREET4, T3FREE, THYROIDAB in the last 72 hours. Anemia Panel: No results for input(s): VITAMINB12, FOLATE, FERRITIN, TIBC, IRON, RETICCTPCT in the last 72 hours. Sepsis Labs: Recent Labs  Lab 03/10/22 1553  PROCALCITON <0.10    No results found for this or any previous visit (from the past 240 hour(s)).   Radiology Studies: DG Chest 2 View  Result Date: 03/10/2022 CLINICAL DATA:  Chest pain. EXAM: CHEST - 2 VIEW COMPARISON:  Chest radiograph dated 03/02/2021. FINDINGS: The heart size and mediastinal contours are within normal limits. Both lungs are clear. The visualized skeletal structures are unremarkable. IMPRESSION: No active cardiopulmonary disease. Electronically Signed   By: Arash  Radparvar M.D.   On: 03/10/2022 01:47   CARDIAC CATHETERIZATION  Addendum Date: 03/11/2022   Unsuccessful attempt due to poor guide engagement from radial access. Significant difficulty engaging any guide, requiring multiple catheters, requiring more than usual effort, radiation, contrast. Continue medical management, especially blood pressure control. Will consider outpatient PCI through femoral access. Manish J Patwardhan, MD Pager: [phone] Office: [phone]   Result Date: 03/11/2022 Images from the original result were not included. Unsuccessful attempt due to poor guide engagement from radial access. Continue medical   management, especially blood pressure control. Will consider outpatient PCI through femoral access. Manish J Patwardhan, MD Pager: [phone] Office: [phone]  CARDIAC CATHETERIZATION  Result Date: 03/10/2022 Left Heart Catheterization 03/10/22: LV: 168/80, EDP 19 mmHg.  Ao 196/90, mean 134 mmHg.  No pressure gradient across the aortic valve. LM: Moderate caliber vessel.  No significant disease.  LAD: Moderate caliber vessel throughout.  Mild diffuse disease.  Gives origin to several small diagonals, diagonal 2 and 3 which arises as bifurcation in the midsegment of the LAD have mild ostial disease.  Mid to distal segment of the LAD has a focal 40% stenosis. CX: Again a moderate caliber vessel giving origin to a very large territory distribution OM1, a 2.0 x 2.25 mm vessel with a high-grade ulcerated proximal tandem 95 to 99% stenosis.  OM 2 is very small caliber and again, moderate area of distribution, ostial 50% stenosis.  Distal circumflex has a 40% stenosis. RCA: The mid segment of the RCA has a stent from 2013 and a 2.75 x 23 mm Xience stent from 10/28/2019.  These are overlapping.  The candy wrapper stenosis both at the inflow and outflow of the old stent from 2013 (proximal ISR, distal new lesion).  There is a 60 to 70% proximal stenosis starting from the proximal segment of the previously placed stent from 2021. Interventional data: Difficult procedure due to poor visualization and resilient in-stent restenosis.  2.75 x 15 mm score flex balloon angioplasty at 20 atmospheric pressure followed by stenting with a 2.75 x 32 mm Synergy XT DES covering the entire in-stent restenosis to outflow stenosis of previously placed stent from 2013.  Successful stenting of the proximal to mid RCA overlapping stent that was placed on 10/28/2019 and the distal edge with a 2.74 x 20 mm Synergy XD deployed at 16 atmospheric pressure.  The entire stented segment was postdilated to 20 atm pressure giving a 3 mm lumen. Recommendation: Patient extremely high risk for recurrent restenosis and progression of coronary disease in view of uncontrolled hypertension, diabetes mellitus and noncompliance with medical advice.  Reiterate risks to the patient.  Aspirin indefinitely and Brilinta for at least 1 year in view of NSTEMI. Patient will be scheduled for elective stage intervention to a very large OM1 tomorrow if she remains stable  through the right.  Although OM1 is 2.0-2.25 mm vessel, has a very large area of distribution and a high-grade ulcerated 99% stenosis.  She is at risk for developing recurrent ACS if untreated.  175 mL contrast utilized   ECHOCARDIOGRAM COMPLETE  Result Date: 03/11/2022    ECHOCARDIOGRAM REPORT   Patient Name:   [name] Date of Exam: 03/10/2022 Medical Rec #:  [id number]        Height:       61.0 in Accession #:    [id number]       Weight:       240.0 lb Date of Birth:  02/13/1966        BSA:          2.041 m Patient Age:    56 years         BP:           165/93 mmHg Patient Gender: F                HR:           95 bpm. Exam Location:  Inpatient Procedure: 2D Echo, Cardiac Doppler, Color Doppler and Intracardiac              Opacification Agent Indications:    Chest pain  History:        Patient has prior history of Echocardiogram examinations, most                 recent 11/16/2019. CHF, Angina, CAD and Previous Myocardial                 Infarction; Risk Factors:Hypertension, Diabetes, Dyslipidemia,                 Sleep Apnea and Former Smoker. Hx DVT.  Sonographer:    Rodrigue Guy RDCS (AE) Referring Phys: [id number] JAY GANJI  Sonographer Comments: Patient is morbidly obese. Image acquisition challenging due to patient body habitus. IMPRESSIONS  1. Left ventricular ejection fraction, by estimation, is 55 to 60%. The left ventricle has normal function. The left ventricle has no regional wall motion abnormalities. There is mild left ventricular hypertrophy. Left ventricular diastolic parameters are indeterminate.  2. Right ventricular systolic function is normal. The right ventricular size is normal.  3. The mitral valve is normal in structure. No evidence of mitral valve regurgitation. No evidence of mitral stenosis.  4. The aortic valve is tricuspid. Aortic valve regurgitation is not visualized. Aortic valve sclerosis/calcification is present, without any evidence of aortic stenosis. Comparison(s): A prior  study was performed on 2021. No significant change from prior study. FINDINGS  Left Ventricle: Left ventricular ejection fraction, by estimation, is 55 to 60%. The left ventricle has normal function. The left ventricle has no regional wall motion abnormalities. Definity contrast agent was given IV to delineate the left ventricular  endocardial borders. The left ventricular internal cavity size was normal in size. There is mild left ventricular hypertrophy. Left ventricular diastolic parameters are indeterminate. Right Ventricle: The right ventricular size is normal. No increase in right ventricular wall thickness. Right ventricular systolic function is normal. Left Atrium: Left atrial size was normal in size. Right Atrium: Right atrial size was normal in size. Pericardium: There is no evidence of pericardial effusion. Mitral Valve: The mitral valve is normal in structure. No evidence of mitral valve regurgitation. No evidence of mitral valve stenosis. MV peak gradient, 7.1 mmHg. The mean mitral valve gradient is 3.0 mmHg. Tricuspid Valve: The tricuspid valve is normal in structure. Tricuspid valve regurgitation is not demonstrated. No evidence of tricuspid stenosis. Aortic Valve: The aortic valve is tricuspid. Aortic valve regurgitation is not visualized. Aortic valve sclerosis/calcification is present, without any evidence of aortic stenosis. Aortic valve mean gradient measures 9.0 mmHg. Aortic valve peak gradient measures 16.0 mmHg. Aortic valve area, by VTI measures 1.45 cm. Pulmonic Valve: The pulmonic valve was normal in structure. Pulmonic valve regurgitation is not visualized. No evidence of pulmonic stenosis. Aorta: The aortic root is normal in size and structure. IAS/Shunts: The interatrial septum was not assessed.  LEFT VENTRICLE PLAX 2D LVIDd:         4.40 cm LVIDs:         3.00 cm LV PW:         1.00 cm LV IVS:        1.10 cm LVOT diam:     2.00 cm LV SV:         52 LV SV Index:   26 LVOT Area:     3.14  cm  RIGHT VENTRICLE RV Basal diam:  2.90 cm RV S prime:     18.30 cm/s TAPSE (M-mode): 2.4 cm LEFT ATRIUM               Index        RIGHT ATRIUM          Index LA diam:        3.60 cm 1.76 cm/m   RA Area:     9.13 cm LA Vol (A2C):   34.4 ml 16.86 ml/m  RA Volume:   15.70 ml 7.69 ml/m LA Vol (A4C):   49.2 ml 24.11 ml/m LA Biplane Vol: 44.5 ml 21.80 ml/m  AORTIC VALVE AV Area (Vmax):    1.55 cm AV Area (Vmean):   1.53 cm AV Area (VTI):     1.45 cm AV Vmax:           200.00 cm/s AV Vmean:          136.000 cm/s AV VTI:            0.362 m AV Peak Grad:      16.0 mmHg AV Mean Grad:      9.0 mmHg LVOT Vmax:         98.50 cm/s LVOT Vmean:        66.100 cm/s LVOT VTI:          0.167 m LVOT/AV VTI ratio: 0.46  AORTA Ao Root diam: 3.00 cm Ao Asc diam:  2.60 cm MITRAL VALVE MV Area VTI:  1.77 cm   SHUNTS MV Peak grad: 7.1 mmHg   Systemic VTI:  0.17 m MV Mean grad: 3.0 mmHg   Systemic Diam: 2.00 cm MV Vmax:      1.33 m/s MV Vmean:     86.5 cm/s Manish Patwardhan MD Electronically signed by Manish Patwardhan MD Signature Date/Time: 03/11/2022/7:36:28 AM    Final      Scheduled Meds:  aspirin  81 mg Oral Daily   [START ON 03/12/2022] heparin  5,000 Units Subcutaneous Q8H   insulin aspart  0-6 Units Subcutaneous Q6H   isosorbide-hydrALAZINE  1 tablet Oral TID   sodium chloride flush  3 mL Intravenous Q12H   sodium chloride flush  3 mL Intravenous Q12H   sodium chloride flush  3 mL Intravenous Q12H   ticagrelor  90 mg Oral BID   Continuous Infusions:  sodium chloride     sodium chloride       LOS: 1 day    Time spent: 50 mins    PARDEEP KUMAR, MD [facility]   If 7PM-7AM, please contact night-coverage  

## 2022-03-11 NOTE — Progress Notes (Signed)
  Transition of Care [facility]) Screening Note   Patient Details  Name: [name] Date of Birth: Jun 15, 1966   Transition of [facility] CM/SW Contact:    Milas Gain, [place] Phone Number: 03/11/2022, 2:12 PM    Transition of Care Department [facility]) has reviewed patient and no TOC needs have been identified at this time. We will continue to monitor patient advancement through interdisciplinary progression rounds. If new patient transition needs arise, please place a TOC consult.

## 2022-03-11 NOTE — Interval H&P Note (Signed)
History and Physical Interval Note:  03/11/2022 7:32 AM  [name]  has presented today for surgery, with the diagnosis of cad.  The various methods of treatment have been discussed with the patient and family. After consideration of risks, benefits and other options for treatment, the patient has consented to  Procedure(s): CORONARY STENT INTERVENTION (N/A) as a surgical intervention.  The patient's history has been reviewed, patient examined, no change in status, stable for surgery.  I have reviewed the patient's chart and labs.  Questions were answered to the patient's satisfaction.    2016 Appropriate Use Criteria for Coronary Revascularization in Patients With Acute Coronary Syndrome NSTEMI/Unstable angina, stabilized patient at high risk Indication:  Revascularization by PCI or CABG of 1 or more arteries in a patient with NSTEMI or unstable angina with Stabilization after presentation High risk for clinical events A (7) Indication: 16; Score 7   [place]

## 2022-03-11 NOTE — Progress Notes (Addendum)
Discussed with pt stents, Brilinta importance, restrictions, diet, exercise, NTG and CRPII. Pt very receptive. She has been going to the gym and eating healthier for about 3 months. She is eager to resume. Encouraged progressive walking for a couple weeks before resistance training. She requests referral to [facility] however she currently does not have insurance. She understands the importance of Brilinta. [place], ACSM 1:50 PM 03/11/2022

## 2022-03-12 ENCOUNTER — Other Ambulatory Visit (HOSPITAL_COMMUNITY): Payer: Self-pay

## 2022-03-12 LAB — BASIC METABOLIC PANEL
Anion gap: 5 (ref 5–15)
BUN: 12 mg/dL (ref 6–20)
CO2: 22 mmol/L (ref 22–32)
Calcium: 8.7 mg/dL — ABNORMAL LOW (ref 8.9–10.3)
Chloride: 111 mmol/L (ref 98–111)
Creatinine, Ser: 0.82 mg/dL (ref 0.44–1.00)
GFR, Estimated: >60 mL/min (ref >60)
Glucose, Bld: 160 mg/dL — ABNORMAL HIGH (ref 70–99)
Potassium: 3.8 mmol/L (ref 3.5–5.1)
Sodium: 138 mmol/L (ref 135–145)

## 2022-03-12 LAB — CBC
HCT: 27.9 % — ABNORMAL LOW (ref 36.0–46.0)
Hemoglobin: 9.4 g/dL — ABNORMAL LOW (ref 12.0–15.0)
MCH: 24.7 pg — ABNORMAL LOW (ref 26.0–34.0)
MCHC: 33.7 g/dL (ref 30.0–36.0)
MCV: 73.4 fL — ABNORMAL LOW (ref 80.0–100.0)
Platelets: 262 10*3/uL (ref 150–400)
RBC: 3.8 MIL/uL — ABNORMAL LOW (ref 3.87–5.11)
RDW: 14 % (ref 11.5–15.5)
WBC: 6.8 10*3/uL (ref 4.0–10.5)
nRBC: 0 % (ref 0.0–0.2)

## 2022-03-12 LAB — GLUCOSE, CAPILLARY
Glucose-Capillary: 141 mg/dL — ABNORMAL HIGH (ref 70–99)
Glucose-Capillary: 144 mg/dL — ABNORMAL HIGH (ref 70–99)
Glucose-Capillary: 149 mg/dL — ABNORMAL HIGH (ref 70–99)
Glucose-Capillary: 200 mg/dL — ABNORMAL HIGH (ref 70–99)

## 2022-03-12 LAB — LIPOPROTEIN A (LPA): Lipoprotein (a): 196.2 nmol/L — ABNORMAL HIGH (ref <75.0)

## 2022-03-12 MED ORDER — ISOSORBIDE MONONITRATE ER 30 MG PO TB24
30.0000 mg | ORAL_TABLET | Freq: Every day | ORAL | 11 refills | Status: DC
  Filled 2022-03-12: qty 30, 30d supply, fill #0

## 2022-03-12 MED ORDER — ISOSORBIDE DINITRATE 20 MG PO TABS
20.0000 mg | ORAL_TABLET | Freq: Three times a day (TID) | ORAL | 1 refills | Status: DC
  Filled 2022-03-12 – 2022-04-06 (×2): qty 90, 30d supply, fill #0

## 2022-03-12 MED ORDER — TICAGRELOR 90 MG PO TABS
90.0000 mg | ORAL_TABLET | Freq: Two times a day (BID) | ORAL | 6 refills | Status: DC
  Filled 2022-03-12 – 2022-04-06 (×3): qty 60, 30d supply, fill #0
  Filled 2022-05-11 – 2022-05-22 (×2): qty 60, 30d supply, fill #1
  Filled 2022-06-25: qty 60, 30d supply, fill #2
  Filled 2022-10-20: qty 60, 30d supply, fill #3

## 2022-03-12 MED ORDER — HYDRALAZINE HCL 20 MG/ML IJ SOLN
10.0000 mg | Freq: Four times a day (QID) | INTRAMUSCULAR | Status: DC | PRN
  Administered 2022-03-12: 10 mg via INTRAVENOUS
  Filled 2022-03-12: qty 1

## 2022-03-12 MED ORDER — ISOSORB DINITRATE-HYDRALAZINE 20-37.5 MG PO TABS
1.0000 | ORAL_TABLET | Freq: Three times a day (TID) | ORAL | 1 refills | Status: DC
  Filled 2022-03-12: qty 90, 30d supply, fill #0

## 2022-03-12 MED ORDER — HYDRALAZINE HCL 25 MG PO TABS
25.0000 mg | ORAL_TABLET | Freq: Three times a day (TID) | ORAL | 1 refills | Status: DC
  Filled 2022-03-12 – 2022-04-06 (×2): qty 90, 30d supply, fill #0

## 2022-03-12 MED FILL — Verapamil HCl IV Soln 2.5 MG/ML: INTRAVENOUS | Qty: 2 | Status: AC

## 2022-03-12 NOTE — Progress Notes (Signed)
Came to ambulate however pt c/o significant nausea after going to [facility] earlier. Will hold ambulation for now, encouraged pt to ambulate herself when she feels better. No questions from yesterdays education.  [id number] [place], ACSM 03/12/2022 10:23 AM

## 2022-03-12 NOTE — Progress Notes (Signed)
Mobility Specialist Progress Note    03/12/22 1640  Mobility  Activity Ambulated independently in hallway  Level of Assistance Standby assist, set-up cues, supervision of patient - no hands on  Assistive Device None  Distance Ambulated (ft) 470 ft  Activity Response Tolerated well  $Mobility charge 1 Mobility   Pre-Mobility: 100 HR, 100% SpO2 During Mobility: 131 HR Post-Mobility: 119 HR  Pt received in chair and agreeable. No complaints on walk. Took x1 short standing rest break. Returned to chair with call bell in reach.    Hildred Alamin Mobility Specialist  Primary: 5N M.S. Phone: [phone] Secondary: 6N M.S. Phone: [phone]

## 2022-03-12 NOTE — Discharge Summary (Signed)
Physician Discharge Summary  [name] YSA:[id number] DOB: [birth year] DOA: 03/10/2022  PCP: Trey Sailors, PA  Admit date: 03/10/2022  Discharge date: 03/12/2022  Admitted From: Home.  Disposition: Home.  Recommendations for Outpatient Follow-up:  Follow up with PCP in 1-2 weeks. Please obtain BMP/CBC in one week. Advised to follow-up with cardiology as scheduled. Advised to take aspirin and Brilinta(uninterrupted for 12 months) Patient needs outpatient PCI through femoral access. Advised to take hydralazine and Isordil as prescribed.  Home Health:None Equipment/Devices: None  Discharge Condition: Stable CODE STATUS: Full code Diet recommendation:  Carb Modified   Brief Summary/ Hospital Course: This 56 y.o. female with medical history significant for coronary artery disease status post NSTEMI in 2019 at which time she underwent PCI with stent placement to the RCA, essential hypertension, type 2 diabetes mellitus, chronic iron deficiency anemia associated baseline hemoglobin 10-12, who is admitted to [facility] on 03/10/2022 with unstable angina.  Cardiology consulted.  Patient underwent left heart cath, found to have very large OM obstruction, cardiology recommended dual antiplatelet therapy for 1 year, elective stage intervention to a very large OM1 next day.  Unable to engage any guide through radial access.  So procedure was aborted.  Cardiologist recommended continue medical management.  Patient will need outpatient PCI through femoral access.  Patient was doing better denies any chest pain.  Medications were adjusted.  Cardiology recommended medical management.  Patient has spiked fever 101.3, denies any cough any urinary symptoms.  Patient remains afebrile for 20 hours.  She wants to be discharged.  Patient is being discharged home.  She will follow-up with cardiology as outpatient.  Discharge Diagnoses:   Principal Problem:   Unstable angina (HCC) Active  Problems:   Essential hypertension   Non-ST elevation (NSTEMI) myocardial infarction (HCC)   Chronic iron deficiency anemia   Chronic diastolic CHF (congestive heart failure) (HCC)   DM2 (diabetes mellitus, type 2) ([place])  Unstable angina with significant CAD history: Cardiology following.  Patient underwent left heart cath 5/23 Patient found to have  OM1 obstruction. Patient underwent electively staged intervention for very large OM1, but unable to engage any guide through radial access.  So procedure was aborted. Cardiology recommended medical management.  Plan is outpatient PCI through femoral access Continue cardioprotective medications.  aspirin, statin, carvedilol, furosemide, losartan at home) Recommended dual antiplatelet agents aspirin and Brilinta uninterrupted for a year.   Chronic systolic HF without exacerbation Appears euvolemic on exam. Cardiology following, on carvedilol, furosemide, losartan at home. Losartan discontinued, started non hydralazine and isordil.   Essential hypertension: Continue home blood pressure medications.   IDDM2 uncontrolled with hyperglycemia -A1c 8.7 -Continue sliding scale insulin, hypoglycemic protocol.   Presumed bronchitis: Possible pulmonary edema vs cardiogenic wheeze Procalcitonin negative, without fever or leukocytosis .  Antibiotics discontinued. Patient without hypoxia or respiratory symptoms at this point Chest x-ray without overt infiltrate or findings, questionable hazy bilateral pulmonary edema noted compared to prior imaging in February per personal review Does not meet sepsis criteria Continue to follow clinically, given acute onset concerning for more central etiology including flash pulmonary edema, possible aspiration pneumonitis, or bronchospasm.   Anemia likely multifactorial. -At baseline, follow repeat labs no signs or symptoms of bleeding    Discharge Instructions  Discharge Instructions     Amb Referral to  Cardiac Rehabilitation   Complete by: As directed    Diagnosis:  Coronary Stents PTCA     After initial evaluation and assessments completed: Virtual Based Care may  be provided alone or in conjunction with Phase 2 Cardiac Rehab based on patient barriers.: Yes   Call MD for:  difficulty breathing, headache or visual disturbances   Complete by: As directed    Call MD for:  persistant dizziness or light-headedness   Complete by: As directed    Call MD for:  persistant nausea and vomiting   Complete by: As directed    Diet - low sodium heart healthy   Complete by: As directed    Diet Carb Modified   Complete by: As directed    Discharge instructions   Complete by: As directed    Advised to follow-up with primary care physician in 1 week. Advised to follow-up with cardiology as scheduled. Advised to take aspirin and Brilinta(uninterrupted for 12 months) Patient needs outpatient PCI through femoral access.   Increase activity slowly   Complete by: As directed       Allergies as of 03/12/2022       Reactions   Detrol [tolterodine Tartrate]    rash        Medication List     STOP taking these medications    amLODipine 10 MG tablet Commonly known as: NORVASC   losartan 100 MG tablet Commonly known as: COZAAR   ondansetron 4 MG disintegrating tablet Commonly known as: Zofran ODT   pantoprazole 40 MG tablet Commonly known as: Protonix       TAKE these medications    aspirin 81 MG tablet Take 81 mg by mouth every morning.   atorvastatin 40 MG tablet Commonly known as: LIPITOR Take 40 mg by mouth daily.   carvedilol 25 MG tablet Commonly known as: Coreg Take 1 tablet (25 mg total) by mouth 2 (two) times daily.   furosemide 40 MG tablet Commonly known as: LASIX Take 40 mg by mouth daily.   glipiZIDE 5 MG 24 hr tablet Commonly known as: GLUCOTROL XL Take 5 mg by mouth daily with breakfast.   hydrALAZINE 25 MG tablet Commonly known as: APRESOLINE Take 1  tablet (25 mg total) by mouth 3 (three) times daily.   Iron 325 (65 Fe) MG Tabs Take 1 tablet by mouth 2 (two) times daily.   isosorbide dinitrate 20 MG tablet Commonly known as: ISORDIL Take 1 tablet (20 mg total) by mouth 3 (three) times daily.   metFORMIN 1000 MG tablet Commonly known as: GLUCOPHAGE Take 1,000 mg by mouth 2 (two) times daily with a meal.   nitroGLYCERIN 0.4 MG SL tablet Commonly known as: NITROSTAT Place 1 tablet (0.4 mg total) under the tongue every 5 (five) minutes x 3 doses as needed for chest pain.   ticagrelor 90 MG Tabs tablet Commonly known as: BRILINTA Take 1 tablet (90 mg total) by mouth 2 (two) times daily.   Vitamin D-3 25 MCG (1000 UT) Caps Take 1 capsule by mouth daily.        Follow-up Information     Elder Negus, MD Follow up on 03/27/2022.   Specialties: Cardiology, Radiology Why: 03/27/22 at 11:00 AM . Please bring all medications to the appointment Contact information: [address] [place] [address] [phone]         Norm Salt, [place] Follow up in 1 week(s).   Specialty: Physician Assistant Contact information: [address] [place] [address] [phone]                Allergies  Allergen Reactions   Detrol [Tolterodine Tartrate]  rash    Consultations: Cardiology   Procedures/Studies: DG Chest 2 View  Result Date: 03/10/2022 CLINICAL DATA:  Chest pain. EXAM: CHEST - 2 VIEW COMPARISON:  Chest radiograph dated 03/02/2021. FINDINGS: The heart size and mediastinal contours are within normal limits. Both lungs are clear. The visualized skeletal structures are unremarkable. IMPRESSION: No active cardiopulmonary disease. Electronically Signed   By: Anner Crete M.D.   On: 03/10/2022 01:47   CARDIAC CATHETERIZATION  Addendum Date: 03/11/2022   Unsuccessful attempt due to poor guide engagement from radial access. Significant difficulty engaging any guide,  requiring multiple catheters, requiring more than usual effort, radiation, contrast. Continue medical management, especially blood pressure control. Will consider outpatient PCI through femoral access. [name], MD Pager: [phone] Office: [phone]   Result Date: 03/11/2022 Images from the original result were not included. Unsuccessful attempt due to poor guide engagement from radial access. Continue medical management, especially blood pressure control. Will consider outpatient PCI through femoral access. [name], MD Pager: [phone] Office: [phone]  CARDIAC CATHETERIZATION  Result Date: 03/10/2022 Left Heart Catheterization 03/10/22: LV: 168/80, EDP 19 mmHg.  Ao 196/90, mean 134 mmHg.  No pressure gradient across the aortic valve. LM: Moderate caliber vessel.  No significant disease. LAD: Moderate caliber vessel throughout.  Mild diffuse disease.  Gives origin to several small diagonals, diagonal 2 and 3 which arises as bifurcation in the midsegment of the LAD have mild ostial disease.  Mid to distal segment of the LAD has a focal 40% stenosis. CX: Again a moderate caliber vessel giving origin to a very large territory distribution OM1, a 2.0 x 2.25 mm vessel with a high-grade ulcerated proximal tandem 95 to 99% stenosis.  OM 2 is very small caliber and again, moderate area of distribution, ostial 50% stenosis.  Distal circumflex has a 40% stenosis. RCA: The mid segment of the RCA has a stent from 2013 and a 2.75 x 23 mm Xience stent from 10/28/2019.  These are overlapping.  The candy wrapper stenosis both at the inflow and outflow of the old stent from 2013 (proximal ISR, distal new lesion).  There is a 60 to 70% proximal stenosis starting from the proximal segment of the previously placed stent from 2021. Interventional data: Difficult procedure due to poor visualization and resilient in-stent restenosis.  2.75 x 15 mm score flex balloon angioplasty at 20 atmospheric  pressure followed by stenting with a 2.75 x 32 mm Synergy XT DES covering the entire in-stent restenosis to outflow stenosis of previously placed stent from 2013.  Successful stenting of the proximal to mid RCA overlapping stent that was placed on 10/28/2019 and the distal edge with a 2.74 x 20 mm Synergy XD deployed at 16 atmospheric pressure.  The entire stented segment was postdilated to 20 atm pressure giving a 3 mm lumen. Recommendation: Patient extremely high risk for recurrent restenosis and progression of coronary disease in view of uncontrolled hypertension, diabetes mellitus and noncompliance with medical advice.  Reiterate risks to the patient.  Aspirin indefinitely and Brilinta for at least 1 year in view of NSTEMI. Patient will be scheduled for elective stage intervention to a very large OM1 tomorrow if she remains stable through the right.  Although OM1 is 2.0-2.25 mm vessel, has a very large area of distribution and a high-grade ulcerated 99% stenosis.  She is at risk for developing recurrent ACS if untreated.  175 mL contrast utilized   ECHOCARDIOGRAM COMPLETE  Result Date: 03/11/2022    ECHOCARDIOGRAM REPORT  Patient Name:   FE OKUBO Date of Exam: 03/10/2022 Medical Rec #:  [id number]        Height:       61.0 in Accession #:    [id number]       Weight:       240.0 lb Date of Birth:  Dec 27, 1965        BSA:          2.041 m Patient Age:    56 years         BP:           165/93 mmHg Patient Gender: F                HR:           95 bpm. Exam Location:  Inpatient Procedure: 2D Echo, Cardiac Doppler, Color Doppler and Intracardiac            Opacification Agent Indications:    Chest pain  History:        Patient has prior history of Echocardiogram examinations, most                 recent 11/16/2019. CHF, Angina, CAD and Previous Myocardial                 Infarction; Risk Factors:Hypertension, Diabetes, Dyslipidemia,                 Sleep Apnea and Former Smoker. Hx DVT.  Sonographer:    Clayton Lefort RDCS (AE) Referring Phys: [id number] [name]  Sonographer Comments: Patient is morbidly obese. Image acquisition challenging due to patient body habitus. IMPRESSIONS  1. Left ventricular ejection fraction, by estimation, is 55 to 60%. The left ventricle has normal function. The left ventricle has no regional wall motion abnormalities. There is mild left ventricular hypertrophy. Left ventricular diastolic parameters are indeterminate.  2. Right ventricular systolic function is normal. The right ventricular size is normal.  3. The mitral valve is normal in structure. No evidence of mitral valve regurgitation. No evidence of mitral stenosis.  4. The aortic valve is tricuspid. Aortic valve regurgitation is not visualized. Aortic valve sclerosis/calcification is present, without any evidence of aortic stenosis. Comparison(s): A prior study was performed on 2021. No significant change from prior study. FINDINGS  Left Ventricle: Left ventricular ejection fraction, by estimation, is 55 to 60%. The left ventricle has normal function. The left ventricle has no regional wall motion abnormalities. Definity contrast agent was given IV to delineate the left ventricular  endocardial borders. The left ventricular internal cavity size was normal in size. There is mild left ventricular hypertrophy. Left ventricular diastolic parameters are indeterminate. Right Ventricle: The right ventricular size is normal. No increase in right ventricular wall thickness. Right ventricular systolic function is normal. Left Atrium: Left atrial size was normal in size. Right Atrium: Right atrial size was normal in size. Pericardium: There is no evidence of pericardial effusion. Mitral Valve: The mitral valve is normal in structure. No evidence of mitral valve regurgitation. No evidence of mitral valve stenosis. MV peak gradient, 7.1 mmHg. The mean mitral valve gradient is 3.0 mmHg. Tricuspid Valve: The tricuspid valve is normal in structure. Tricuspid  valve regurgitation is not demonstrated. No evidence of tricuspid stenosis. Aortic Valve: The aortic valve is tricuspid. Aortic valve regurgitation is not visualized. Aortic valve sclerosis/calcification is present, without any evidence of aortic stenosis. Aortic valve mean gradient measures 9.0 mmHg. Aortic valve peak gradient measures 16.0 mmHg.  Aortic valve area, by VTI measures 1.45 cm. Pulmonic Valve: The pulmonic valve was normal in structure. Pulmonic valve regurgitation is not visualized. No evidence of pulmonic stenosis. Aorta: The aortic root is normal in size and structure. IAS/Shunts: The interatrial septum was not assessed.  LEFT VENTRICLE PLAX 2D LVIDd:         4.40 cm LVIDs:         3.00 cm LV PW:         1.00 cm LV IVS:        1.10 cm LVOT diam:     2.00 cm LV SV:         52 LV SV Index:   26 LVOT Area:     3.14 cm  RIGHT VENTRICLE RV Basal diam:  2.90 cm RV S prime:     18.30 cm/s TAPSE (M-mode): 2.4 cm LEFT ATRIUM             Index        RIGHT ATRIUM          Index LA diam:        3.60 cm 1.76 cm/m   RA Area:     9.13 cm LA Vol (A2C):   34.4 ml 16.86 ml/m  RA Volume:   15.70 ml 7.69 ml/m LA Vol (A4C):   49.2 ml 24.11 ml/m LA Biplane Vol: 44.5 ml 21.80 ml/m  AORTIC VALVE AV Area (Vmax):    1.55 cm AV Area (Vmean):   1.53 cm AV Area (VTI):     1.45 cm AV Vmax:           200.00 cm/s AV Vmean:          136.000 cm/s AV VTI:            0.362 m AV Peak Grad:      16.0 mmHg AV Mean Grad:      9.0 mmHg LVOT Vmax:         98.50 cm/s LVOT Vmean:        66.100 cm/s LVOT VTI:          0.167 m LVOT/AV VTI ratio: 0.46  AORTA Ao Root diam: 3.00 cm Ao Asc diam:  2.60 cm MITRAL VALVE MV Area VTI:  1.77 cm   SHUNTS MV Peak grad: 7.1 mmHg   Systemic VTI:  0.17 m MV Mean grad: 3.0 mmHg   Systemic Diam: 2.00 cm MV Vmax:      1.33 m/s MV Vmean:     86.5 cm/s Manish Patwardhan MD Electronically signed by Vernell Leep MD Signature Date/Time: 03/11/2022/7:36:28 AM    Final    (Echo, Carotid, EGD,  Colonoscopy, ERCP)    Subjective: Patient was seen and examined at bedside.  Overnight events noted.  Patient reports feeling much improved.   Patient wants to be discharged.  Patient is being discharged home.  Discharge Exam: Vitals:   03/12/22 0940 03/12/22 1333  BP:  (!) 185/100  Pulse:  85  Resp:  16  Temp: 98.1 F (36.7 C) 98 F (36.7 C)  SpO2:  100%   Vitals:   03/12/22 0035 03/12/22 0556 03/12/22 0940 03/12/22 1333  BP: (!) 145/59 (!) 157/86  (!) 185/100  Pulse: 100 78  85  Resp: $Remo'18 20  16  'HUEyP$ Temp: 98.9 F (37.2 C) 98.8 F (37.1 C) 98.1 F (36.7 C) 98 F (36.7 C)  TempSrc: Oral Oral Oral Oral  SpO2: 100% 99%  100%  Weight:  119 kg  Height:        General: Pt is alert, awake, not in acute distress Cardiovascular: RRR, S1/S2 +, no rubs, no gallops Respiratory: CTA bilaterally, no wheezing, no rhonchi Abdominal: Soft, NT, ND, bowel sounds + Extremities: no edema, no cyanosis    The results of significant diagnostics from this hospitalization (including imaging, microbiology, ancillary and laboratory) are listed below for reference.     Microbiology: No results found for this or any previous visit (from the past 240 hour(s)).   Labs: BNP (last 3 results) No results for input(s): BNP in the last 8760 hours. Basic Metabolic Panel: Recent Labs  Lab 03/10/22 0142 03/10/22 0507 03/11/22 0150 03/12/22 0303  NA 139  --  140 138  K 4.0  --  3.8 3.8  CL 107  --  111 111  CO2 24  --  21* 22  GLUCOSE 290*  --  121* 160*  BUN 15  --  10 12  CREATININE 0.95  --  0.80 0.82  CALCIUM 8.8*  --  9.0 8.7*  MG  --  2.0  --   --    Liver Function Tests: No results for input(s): AST, ALT, ALKPHOS, BILITOT, PROT, ALBUMIN in the last 168 hours. No results for input(s): LIPASE, AMYLASE in the last 168 hours. No results for input(s): AMMONIA in the last 168 hours. CBC: Recent Labs  Lab 03/10/22 0142 03/11/22 0150 03/12/22 0303  WBC 5.7 6.1 6.8  HGB 9.8* 10.3*  9.4*  HCT 29.7* 30.8* 27.9*  MCV 74.3* 73.5* 73.4*  PLT 277 297 262   Cardiac Enzymes: No results for input(s): CKTOTAL, CKMB, CKMBINDEX, TROPONINI in the last 168 hours. BNP: Invalid input(s): POCBNP CBG: Recent Labs  Lab 03/11/22 0618 03/11/22 1827 03/12/22 0033 03/12/22 0613 03/12/22 1156  GLUCAP 152* 188* 144* 149* 200*   D-Dimer No results for input(s): DDIMER in the last 72 hours. Hgb A1c Recent Labs    03/10/22 0454  HGBA1C 8.7*   Lipid Profile No results for input(s): CHOL, HDL, LDLCALC, TRIG, CHOLHDL, LDLDIRECT in the last 72 hours. Thyroid function studies No results for input(s): TSH, T4TOTAL, T3FREE, THYROIDAB in the last 72 hours.  Invalid input(s): FREET3 Anemia work up No results for input(s): VITAMINB12, FOLATE, FERRITIN, TIBC, IRON, RETICCTPCT in the last 72 hours. Urinalysis    Component Value Date/Time   COLORURINE YELLOW 02/07/2017 1227   APPEARANCEUR HAZY (A) 02/07/2017 1227   LABSPEC 1.017 02/07/2017 1227   PHURINE 5.0 02/07/2017 1227   GLUCOSEU NEGATIVE 02/07/2017 1227   HGBUR NEGATIVE 02/07/2017 1227   [place] 02/07/2017 1227   KETONESUR NEGATIVE 02/07/2017 1227   PROTEINUR NEGATIVE 02/07/2017 1227   UROBILINOGEN 0.2 11/01/2014 1210   NITRITE NEGATIVE 02/07/2017 1227   LEUKOCYTESUR TRACE (A) 02/07/2017 1227   Sepsis Labs Invalid input(s): PROCALCITONIN,  WBC,  LACTICIDVEN Microbiology No results found for this or any previous visit (from the past 240 hour(s)).   Time coordinating discharge: Over 30 minutes  SIGNED:   Shawna Clamp, MD  [facility] 03/12/2022, 4:35 PM Pager   If 7PM-7AM, please contact night-coverage [email or web address] Password [email or web address]

## 2022-03-12 NOTE — Discharge Instructions (Signed)
Advised to follow-up with primary care physician in 1 week. Advised to follow-up with cardiology as scheduled. Advised to take aspirin and Brilinta(uninterrupted for 12 months) Patient needs outpatient PCI through femoral access.

## 2022-03-12 NOTE — TOC Initial Note (Addendum)
Transition of Care [facility]) - Initial/Assessment Note    Patient Details  Name: [name] MRN: [id number] Date of Birth: Dec 03, 1965  [facility] CM/SW Contact:    Bethena Roys, RN Phone Number: 03/12/2022, 12:59 PM  Clinical Narrative: Case Manager spoke with the patient regarding disposition needs. Patient is currently not working and is without insurance. Patient has a PCP on [facility] Dr. Caryl Pina; however, she cannot pay for visits. Case Manager has called the [facility] to see if an appointment can be scheduled. Awaiting for office to call back. PTA patient was from home with relatives in an apartment. Case Manager will follow for cost for medications-may be a MATCH candidate.                  1420 03-12-22 Case Manager received a call from [facility]- appointment has been scheduled.    1700 03-12-22 MATCH completed for this patient- medications will be delivered via [place].   Expected Discharge Plan: Home/Self Care Barriers to Discharge: Continued Medical Work up   Patient Goals and CMS Choice Patient states their goals for this hospitalization and ongoing recovery are:: to return home      Expected Discharge Plan and Services Expected Discharge Plan: Home/Self Care In-house Referral: PCP / [facility] Discharge Planning Services: CM Consult Post Acute Care Choice: NA Living arrangements for the past 2 months: Apartment                   DME Agency: NA       HH Arranged: NA          Prior Living Arrangements/Services Living arrangements for the past 2 months: Apartment Lives with:: Relatives Patient language and need for interpreter reviewed:: Yes Do you feel safe going back to the place where you live?: Yes      Need for Family Participation in Patient Care: Yes (Comment) Care giver support system in place?: Yes (comment)   Criminal Activity/Legal Involvement Pertinent to Current  Situation/Hospitalization: No - Comment as needed  Activities of Daily Living Home Assistive Devices/Equipment: None ADL Screening (condition at time of admission) Patient's cognitive ability adequate to safely complete daily activities?: Yes Is the patient deaf or have difficulty hearing?: No Does the patient have difficulty seeing, even when wearing glasses/contacts?: No Does the patient have difficulty concentrating, remembering, or making decisions?: No Patient able to express need for assistance with ADLs?: Yes Does the patient have difficulty dressing or bathing?: No Independently performs ADLs?: Yes (appropriate for developmental age) Does the patient have difficulty walking or climbing stairs?: No Weakness of Legs: None Weakness of Arms/Hands: None  Permission Sought/Granted Permission sought to share information with : Family Supports, Customer service manager, Case Optician, dispensing granted to share information with : Yes, Verbal Permission Granted              Emotional Assessment Appearance:: Appears stated age Attitude/Demeanor/Rapport: Engaged Affect (typically observed): Appropriate Orientation: : Oriented to Self, Oriented to  Time, Oriented to Situation, Oriented to Place Alcohol / Substance Use: Not Applicable Psych Involvement: No (comment)  Admission diagnosis:  Unstable angina ([place]) [I20.0] Precordial chest pain [R07.2] Patient Active Problem List   Diagnosis Date Noted   Chronic iron deficiency anemia 03/10/2022   Chronic diastolic CHF (congestive heart failure) ([place]) 03/10/2022   DM2 (diabetes mellitus, type 2) ([place]) 03/10/2022   Morbidly obese ([place]) 08/19/2020   Uncontrolled type 2 diabetes mellitus with hyperglycemia ([place]) 08/19/2020  Unstable angina ([place]) 11/16/2019   Non-ST elevation (NSTEMI) myocardial infarction ([place]) 10/27/2017   OSA (obstructive sleep apnea) 02/21/2013   CAD (coronary artery disease), native coronary artery  11/18/2011   Hyperlipidemia 11/18/2011   Chest pain syndrome 11/17/2011   Abnormal cardiovascular stress test 11/17/2011   DVT (deep venous thrombosis) ([place]) 11/01/2011   Essential hypertension 11/01/2011   Anemia 11/01/2011   PCP:  Trey Sailors, PA Pharmacy:   [facility], [place] [address] Phone: [phone] Fax: [phone]  [id number] [place] [place]Phone: [phone]: [phone] [id number] [address] [place]Phone: [phone]: [phone] [facility] [address]Phone: [phone]: [phone] Readmission Risk Interventions     View : No data to display.

## 2022-03-12 NOTE — Progress Notes (Signed)
Pt's BP 185/100. MD notified. Awaiting orders.

## 2022-03-20 ENCOUNTER — Telehealth (HOSPITAL_COMMUNITY): Payer: Self-pay

## 2022-03-20 NOTE — Telephone Encounter (Signed)
Called and spoke with pt in regards to CR, pt stated she doesn't have insurance at this time. Pt stated she is currently walking at home, not interested at this time.   Closed referral

## 2022-03-25 ENCOUNTER — Encounter (HOSPITAL_COMMUNITY): Payer: Self-pay | Admitting: Cardiology

## 2022-03-27 ENCOUNTER — Ambulatory Visit: Payer: Medicaid Other | Admitting: Cardiology

## 2022-04-01 ENCOUNTER — Other Ambulatory Visit (HOSPITAL_COMMUNITY): Payer: Self-pay

## 2022-04-01 ENCOUNTER — Other Ambulatory Visit: Payer: Self-pay

## 2022-04-01 ENCOUNTER — Telehealth (HOSPITAL_COMMUNITY): Payer: Self-pay

## 2022-04-01 NOTE — Telephone Encounter (Signed)
Pharmacy Transitions of Care Follow-up Telephone Call  Date of discharge: 03/12/22  Discharge Diagnosis: Unstable angina  How have you been since you were released from the hospital? Some good days and some bad. She has been tired and nauseous. Some chest pain but better than before she went to the hospital. Has been cutting blood pressure meds in half because they were too strong. No insurance, will start going to [employer] [facility] [employer].  Medication changes made at discharge: START taking these medications  START taking these medications  Brilinta 90 MG Tabs tablet Generic drug: ticagrelor Take 1 tablet (90 mg total) by mouth 2 (two) times daily.  hydrALAZINE 25 MG tablet Commonly known as: APRESOLINE Take 1 tablet (25 mg total) by mouth 3 (three) times daily.  isosorbide dinitrate 20 MG tablet Commonly known as: ISORDIL Take 1 tablet (20 mg total) by mouth 3 (three) times daily.   CONTINUE taking these medications  CONTINUE taking these medications  aspirin 81 MG tablet  atorvastatin 40 MG tablet Commonly known as: LIPITOR  carvedilol 25 MG tablet Commonly known as: Coreg Take 1 tablet (25 mg total) by mouth 2 (two) times daily.  furosemide 40 MG tablet Commonly known as: LASIX  glipiZIDE 5 MG 24 hr tablet Commonly known as: GLUCOTROL XL  Iron 325 (65 Fe) MG Tabs  metFORMIN 1000 MG tablet Commonly known as: GLUCOPHAGE  nitroGLYCERIN 0.4 MG SL tablet Commonly known as: NITROSTAT Place 1 tablet (0.4 mg total) under the tongue every 5 (five) minutes x 3 doses as needed for chest pain.  Vitamin D-3 25 MCG (1000 UT) Caps   STOP taking these medications  STOP taking these medications  amLODipine 10 MG tablet Commonly known as: NORVASC  losartan 100 MG tablet Commonly known as: COZAAR  ondansetron 4 MG disintegrating tablet Commonly known as: Zofran ODT  pantoprazole 40 MG tablet Commonly known as: Protonix    Medication changes verified by the patient? yes      Medication Accessibility:  Home Pharmacy: [facility]   Was the patient provided with refills on discharged medications? yes   Have all prescriptions been transferred from [facility] to home pharmacy? Yes- [facility]   Is the patient able to afford medications? Uninsured/match Notable copays: Brilinta Eligible patient assistance: yes    Medication Review:  TICAGRELOR (BRILINTA) Ticagrelor 90 mg BID initiated on 03/12/22.  - Educated patient on expected duration of therapy of aspirin '81mg'$  with ticagrelor.  - Discussed importance of taking medication around the same time every day, - Reviewed potential DDIs with patient - Advised patient of medications to avoid (NSAIDs, aspirin maintenance doses>100 mg daily) - Educated that Tylenol (acetaminophen) will be the preferred analgesic to prevent risk of bleeding  - Emphasized importance of monitoring for signs and symptoms of bleeding (abnormal bruising, prolonged bleeding, nose bleeds, bleeding from gums, discolored urine, black tarry stools)  - Educated patient to notify doctor if shortness of breath or abnormal heartbeat occur - Advised patient to alert all providers of antiplatelet therapy prior to starting a new medication or having a procedure   Follow-up Appointments: Date Visit Type Length Department    04/02/2022  2:30 PM HOSPITAL FU 20 min [place] [[id number]]    If their condition worsens, is the pt aware to call PCP or go to the Emergency Dept.? yes  Final Patient Assessment: Some good days and some bad. She has been tired and nauseous. Some chest pain but better than before she went  to the hospital. Has been cutting blood pressure meds in half because they were too strong. No insurance, will start going to [employer] [facility] [employer].  Darcus Austin, [facility] 04/01/2022 1:52 PM

## 2022-04-02 ENCOUNTER — Encounter (HOSPITAL_COMMUNITY): Payer: Self-pay

## 2022-04-02 ENCOUNTER — Emergency Department (HOSPITAL_COMMUNITY)
Admission: EM | Admit: 2022-04-02 | Discharge: 2022-04-02 | Discharge status: Against Medical Advice | Payer: Medicaid Other | Attending: Emergency Medicine | Admitting: Emergency Medicine

## 2022-04-02 ENCOUNTER — Encounter (INDEPENDENT_AMBULATORY_CARE_PROVIDER_SITE_OTHER): Payer: Self-pay | Admitting: Primary Care

## 2022-04-02 ENCOUNTER — Emergency Department (HOSPITAL_COMMUNITY): Payer: Medicaid Other

## 2022-04-02 ENCOUNTER — Ambulatory Visit (INDEPENDENT_AMBULATORY_CARE_PROVIDER_SITE_OTHER): Payer: Medicaid Other | Admitting: Primary Care

## 2022-04-02 ENCOUNTER — Other Ambulatory Visit: Payer: Self-pay

## 2022-04-02 VITALS — BP 138/81 | HR 100 | Temp 98.1°F | Ht 61.0 in

## 2022-04-02 DIAGNOSIS — R110 Nausea: Secondary | ICD-10-CM | POA: Diagnosis not present

## 2022-04-02 DIAGNOSIS — R42 Dizziness and giddiness: Secondary | ICD-10-CM | POA: Insufficient documentation

## 2022-04-02 DIAGNOSIS — I5032 Chronic diastolic (congestive) heart failure: Secondary | ICD-10-CM | POA: Diagnosis not present

## 2022-04-02 DIAGNOSIS — N939 Abnormal uterine and vaginal bleeding, unspecified: Secondary | ICD-10-CM | POA: Diagnosis not present

## 2022-04-02 DIAGNOSIS — Z5321 Procedure and treatment not carried out due to patient leaving prior to being seen by health care provider: Secondary | ICD-10-CM | POA: Insufficient documentation

## 2022-04-02 DIAGNOSIS — R079 Chest pain, unspecified: Secondary | ICD-10-CM | POA: Diagnosis present

## 2022-04-02 LAB — BASIC METABOLIC PANEL
Anion gap: 10 (ref 5–15)
BUN: 17 mg/dL (ref 6–20)
CO2: 25 mmol/L (ref 22–32)
Calcium: 9.3 mg/dL (ref 8.9–10.3)
Chloride: 104 mmol/L (ref 98–111)
Creatinine, Ser: 0.96 mg/dL (ref 0.44–1.00)
GFR, Estimated: >60 mL/min (ref >60)
Glucose, Bld: 242 mg/dL — ABNORMAL HIGH (ref 70–99)
Potassium: 4.1 mmol/L (ref 3.5–5.1)
Sodium: 139 mmol/L (ref 135–145)

## 2022-04-02 LAB — TROPONIN I (HIGH SENSITIVITY)
Troponin I (High Sensitivity): 10 ng/L (ref <18)
Troponin I (High Sensitivity): 11 ng/L (ref <18)

## 2022-04-02 LAB — CBC
HCT: 25 % — ABNORMAL LOW (ref 36.0–46.0)
Hemoglobin: 8.2 g/dL — ABNORMAL LOW (ref 12.0–15.0)
MCH: 25 pg — ABNORMAL LOW (ref 26.0–34.0)
MCHC: 32.8 g/dL (ref 30.0–36.0)
MCV: 76.2 fL — ABNORMAL LOW (ref 80.0–100.0)
Platelets: 315 10*3/uL (ref 150–400)
RBC: 3.28 MIL/uL — ABNORMAL LOW (ref 3.87–5.11)
RDW: 14 % (ref 11.5–15.5)
WBC: 7.3 10*3/uL (ref 4.0–10.5)
nRBC: 0 % (ref 0.0–0.2)

## 2022-04-02 LAB — I-STAT BETA HCG BLOOD, ED (MC, WL, AP ONLY): I-stat hCG, quantitative: <5 m[IU]/mL (ref <5)

## 2022-04-02 LAB — TYPE AND SCREEN
ABO/RH(D): O POS
Antibody Screen: NEGATIVE

## 2022-04-02 NOTE — ED Triage Notes (Addendum)
Pt reports left sided chest pain onset 3 days ago that radiates to her back. She also reports vaginal bleeding with blood clots associated with dizziness and nausea. Pt reports going through about 3-4 pads a day onset 3 weeks ago.

## 2022-04-02 NOTE — ED Notes (Signed)
X2 no response....registration states this person left.

## 2022-04-02 NOTE — ED Notes (Signed)
X1 no response 

## 2022-04-02 NOTE — Progress Notes (Unsigned)
[facility]   Subjective:   [name] is a 56 y.o. female presents for hospital follow up and establish care. Admit date to the hospital was 03/10/22, patient was discharged from the hospital on 03/12/22, patient was admitted for: Essential hypertension, Non-ST elevation (NSTEMI) myocardial infarction [facility]), Unstable angina ([place]), Chronic iron deficiency anemia, Chronic diastolic CHF (congestive heart failure) ([place]), DM2 (diabetes mellitus, type 2) (HCC)Chest Pain  Vaginal Bleeding She c/o chest pain center of chest to back , headache, lightheaded and dizziness. Passing clots  Past Medical History:  Diagnosis Date   Anemia    CAD (coronary artery disease)    PCI with stent to RCA in 2019   CHF (congestive heart failure) ([place]) 04/2011   EF 15-20% from echo 05/19/11   DOE (dyspnea on exertion)    DVT (deep venous thrombosis) ([place]) 06/2011   LLE   Fatigue    HTN (hypertension)    Menorrhagia    with iron deficient anemia   NSTEMI (non-ST elevated myocardial infarction) ([place]) 10/26/2017   NSTEMI (non-ST elevated myocardial infarction) ([place]) 10/27/2017   Obesity    Orthopnea    Type II diabetes mellitus (HCC)      Allergies  Allergen Reactions   Detrol [Tolterodine Tartrate]     rash      Current Outpatient Medications on File Prior to Visit  Medication Sig Dispense Refill   aspirin 81 MG tablet Take 81 mg by mouth every morning.      hydrALAZINE (APRESOLINE) 25 MG tablet Take 1 tablet (25 mg total) by mouth 3 (three) times daily. 90 tablet 1   isosorbide dinitrate (ISORDIL) 20 MG tablet Take 1 tablet (20 mg total) by mouth 3 (three) times daily. 90 tablet 1   ticagrelor (BRILINTA) 90 MG TABS tablet Take 1 tablet (90 mg total) by mouth 2 (two) times daily. 60 tablet 6   carvedilol (COREG) 25 MG tablet Take 1 tablet (25 mg total) by mouth 2 (two) times daily. 60 tablet 11   Ferrous Sulfate (IRON) 325 (65 FE) MG TABS Take 1 tablet by mouth 2 (two) times daily.        furosemide (LASIX) 40 MG tablet Take 40 mg by mouth daily.      glipiZIDE (GLUCOTROL XL) 5 MG 24 hr tablet Take 5 mg by mouth daily with breakfast.     metFORMIN (GLUCOPHAGE) 1000 MG tablet Take 1,000 mg by mouth 2 (two) times daily with a meal.  (Patient not taking: Reported on 04/02/2022)     No current facility-administered medications on file prior to visit.     Review of System: Comprehensive ROS Pertinent positive and negative noted in HPI    Objective:  BP 138/81   Pulse 100   Temp 98.1 F (36.7 C) (Oral)   Ht '5\' 1"'$  (1.549 m)   LMP 06/16/2019   SpO2 100%   BMI 49.58 kg/m   Filed Weights    Physical Exam:   General Appearance: Well nourished, in no apparent distress. Eyes: PERRLA, EOMs, conjunctiva no swelling or erythema Sinuses: No Frontal/maxillary tenderness ENT/Mouth: Ext aud canals clear, TMs without erythema, bulging. No erythema, swelling, or exudate on post pharynx.  Tonsils not swollen or erythematous. Hearing normal.  Neck: Supple, thyroid normal.  Respiratory: Respiratory effort normal, BS equal bilaterally without rales, rhonchi, wheezing or stridor.  Cardio: RRR with no MRGs. Brisk peripheral pulses without edema.  Abdomen: Soft, + BS.  Non tender, no guarding, rebound, hernias, masses. Lymphatics:  Non tender without lymphadenopathy.  Musculoskeletal: Full ROM, 5/5 strength, normal gait.  Skin: Warm, dry without rashes, lesions, ecchymosis.  Neuro: Cranial nerves intact. Normal muscle tone, no cerebellar symptoms. Sensation intact.  Psych: Awake and oriented X 3, normal affect, Insight and Judgment appropriate.    Assessment:     This note has been created with Surveyor, quantity. Any transcriptional errors are unintentional.   Kerin Perna, NP 04/02/2022, 2:45 PM

## 2022-04-02 NOTE — ED Provider Triage Note (Signed)
Emergency Medicine Provider Triage Evaluation Note  [name] , a 56 y.o. female  was evaluated in triage.  Pt complains of left sided chest pain and vaginal bleeding  Review of Systems  Positive: Vaginal bleeding for 2 weeks Negative: Cough or fever  Physical Exam  BP (!) 156/87 (BP Location: Right Arm)   Pulse (!) 102   Temp 98.5 F (36.9 C) (Oral)   Resp 18   Ht '5\' 1"'$  (1.549 m)   LMP 06/16/2019   SpO2 100%   BMI 49.58 kg/m  Gen:   Awake, no distress   Resp:  Normal effort  MSK:   Moves extremities without difficulty  Other:    Medical Decision Making  Medically screening exam initiated at 4:02 PM.  Appropriate orders placed.  [name] was informed that the remainder of the evaluation will be completed by another provider, this initial triage assessment does not replace that evaluation, and the importance of remaining in the ED until their evaluation is complete.     Fransico Meadow, [place] 04/02/22 1603

## 2022-04-06 ENCOUNTER — Other Ambulatory Visit: Payer: Self-pay

## 2022-04-13 ENCOUNTER — Inpatient Hospital Stay (INDEPENDENT_AMBULATORY_CARE_PROVIDER_SITE_OTHER): Payer: Medicaid Other | Admitting: Primary Care

## 2022-04-16 ENCOUNTER — Encounter: Payer: Self-pay | Admitting: Cardiology

## 2022-04-16 ENCOUNTER — Other Ambulatory Visit: Payer: Self-pay

## 2022-04-16 ENCOUNTER — Ambulatory Visit: Payer: Medicaid Other | Admitting: Cardiology

## 2022-04-16 VITALS — BP 137/80 | HR 113 | Temp 98.0°F | Resp 16 | Ht 61.0 in | Wt 269.0 lb

## 2022-04-16 DIAGNOSIS — D500 Iron deficiency anemia secondary to blood loss (chronic): Secondary | ICD-10-CM

## 2022-04-16 DIAGNOSIS — I10 Essential (primary) hypertension: Secondary | ICD-10-CM

## 2022-04-16 DIAGNOSIS — I25118 Atherosclerotic heart disease of native coronary artery with other forms of angina pectoris: Secondary | ICD-10-CM

## 2022-04-16 MED ORDER — ISOSORBIDE MONONITRATE ER 60 MG PO TB24
60.0000 mg | ORAL_TABLET | Freq: Every day | ORAL | 1 refills | Status: DC
  Filled 2022-04-16 – 2022-05-11 (×2): qty 90, 90d supply, fill #0

## 2022-04-16 MED ORDER — METOPROLOL TARTRATE 25 MG PO TABS
25.0000 mg | ORAL_TABLET | Freq: Two times a day (BID) | ORAL | 3 refills | Status: DC
  Filled 2022-04-16 – 2022-06-05 (×3): qty 180, 90d supply, fill #0

## 2022-04-16 NOTE — Progress Notes (Signed)
Follow up visit  Subjective:   [name], female    DOB: 10-07-1966, 56 y.o.   MRN: [id number]     HPI  Chief Complaint  Patient presents with   Coronary Artery Disease   Hospitalization Follow-up    56 y/o African-American female with hypertension, type 2 diabetes mellitus, CAD  Patient was hospitalized in May 2023 with chest pain, non-STEMI.  She underwent successful PCI to mid RCA by Dr. Jacinto Halim.  She also had known culprit vessel severe stenosis in OM1.  Unfortunately, I was not able to complete intervention to OM1 due to difficulty with guide engagement and access through right radial artery.  Therefore, procedure was aborted with potential plans for outpatient repeat PCI attempt through femoral access.   Since her PCI, patient presented to emergency room with left-sided chest pain and vaginal bleeding.  Of note, patient is postmenopausal.  She underwent labs that showed anemia with hemoglobin of 8, 2 points lower than her hospital discharge 2 weeks prior.  Fortunately, 2 troponins were negative, ACS was ruled out.  Patient waited in the ER and eventually left without being seen.  She has not seen any OB/GYN since then, but her vaginal bleeding has since stopped.  Patient continues to have episodes of chest pain and shortness of breath with exertion.  Blood pressure is well controlled.  She has been out of her carvedilol for at least 2 weeks.    Current Outpatient Medications:    aspirin 81 MG tablet, Take 81 mg by mouth every morning. , Disp: , Rfl:    atorvastatin (LIPITOR) 20 MG tablet, Take 20 mg by mouth daily., Disp: , Rfl:    Ferrous Sulfate (IRON) 325 (65 FE) MG TABS, Take 1 tablet by mouth 2 (two) times daily.  , Disp: , Rfl:    furosemide (LASIX) 40 MG tablet, Take 40 mg by mouth daily. , Disp: , Rfl:    glipiZIDE (GLUCOTROL XL) 5 MG 24 hr tablet, Take 5 mg by mouth daily with breakfast., Disp: , Rfl:    hydrALAZINE (APRESOLINE) 25 MG tablet, Take 1 tablet (25  mg total) by mouth 3 (three) times daily., Disp: 90 tablet, Rfl: 1   isosorbide dinitrate (ISORDIL) 20 MG tablet, Take 1 tablet (20 mg total) by mouth 3 (three) times daily., Disp: 90 tablet, Rfl: 1   leuprolide (LUPRON DEPOT, 44-MONTH,) 3.75 MG injection, , Disp: , Rfl:    metFORMIN (GLUCOPHAGE) 1000 MG tablet, Take 1,000 mg by mouth 2 (two) times daily with a meal., Disp: , Rfl:    ticagrelor (BRILINTA) 90 MG TABS tablet, Take 1 tablet (90 mg total) by mouth 2 (two) times daily., Disp: 60 tablet, Rfl: 6   carvedilol (COREG) 25 MG tablet, Take 1 tablet (25 mg total) by mouth 2 (two) times daily., Disp: 60 tablet, Rfl: 11   Cardiovascular & other pertient studies:  Reviewed external labs and tests, independently interpreted  EKG 04/16/2022: Sinus rhythm 107 bpm Nonspecific T-abnormality  Coronary intervention 03/11/2022: Unsuccessful attempt due to poor guide engagement from radial access. Significant difficulty engaging any guide, requiring multiple catheters, requiring more than usual effort, radiation, contrast. Continue medical management, especially blood pressure control. Will consider outpatient PCI to OM1 through femoral access.  Coronary intervention 03/10/2022:  LV: 168/80, EDP 19 mmHg.  Ao 196/90, mean 134 mmHg.  No pressure gradient across the aortic valve. LM: Moderate caliber vessel.  No significant disease. LAD: Moderate caliber vessel throughout.  Mild diffuse disease.  Gives origin to several small diagonals, diagonal 2 and 3 which arises as bifurcation in the midsegment of the LAD have mild ostial disease.  Mid to distal segment of the LAD has a focal 40% stenosis. CX: Again a moderate caliber vessel giving origin to a very large territory distribution OM1, a 2.0 x 2.25 mm vessel with a high-grade ulcerated proximal tandem 95 to 99% stenosis.  OM 2 is very small caliber and again, moderate area of distribution, ostial 50% stenosis.  Distal circumflex has a 40% stenosis. RCA:  The mid segment of the RCA has a stent from 2013 and a 2.75 x 23 mm Xience stent from 10/28/2019.  These are overlapping.  The candy wrapper stenosis both at the inflow and outflow of the old stent from 2013 (proximal ISR, distal new lesion).  There is a 60 to 70% proximal stenosis starting from the proximal segment of the previously placed stent from 2021.   Interventional data: Difficult procedure due to poor visualization and resilient in-stent restenosis.  2.75 x 15 mm score flex balloon angioplasty at 20 atmospheric pressure followed by stenting with a 2.75 x 32 mm Synergy XT DES covering the entire in-stent restenosis to outflow stenosis of previously placed stent from 2013.  Successful stenting of the proximal to mid RCA overlapping stent that was placed on 10/28/2019 and the distal edge with a 2.74 x 20 mm Synergy XD deployed at 16 atmospheric pressure.  The entire stented segment was postdilated to 20 atm pressure giving a 3 mm lumen.  Recommendation: Patient extremely high risk for recurrent restenosis and progression of coronary disease in view of uncontrolled hypertension, diabetes mellitus and noncompliance with medical advice.  Reiterate risks to the patient.  Aspirin indefinitely and Brilinta for at least 1 year in view of NSTEMI.  Patient will be scheduled for elective stage intervention to a very large OM1 tomorrow if she remains stable through the right.  Although OM1 is 2.0-2.25 mm vessel, has a very large area of distribution and a high-grade ulcerated 99% stenosis.  She is at risk for developing recurrent ACS if untreated.  175 mL contrast utilized  Recent labs: 04/02/2022: Glucose 242, BUN/Cr 17/0.96. EGFR >60. Na/K 139/4.1.  H/H 8.2/25. MCV 76. Platelets 315 HbA1C 8.7% Lipid panel NA Lipoprotein a 196  Latest Reference Range & Units 04/02/22 15:48 04/02/22 16:08  Troponin I (High Sensitivity) <18 ng/L 11 10     Review of Systems  Cardiovascular:  Positive for chest pain and  dyspnea on exertion. Negative for leg swelling, palpitations and syncope.       Vitals:   04/16/22 0940  BP: 137/80  Pulse: (!) 113  Resp: 16  Temp: 98 F (36.7 C)  SpO2: 99%    Body mass index is 50.83 kg/m. Filed Weights   04/16/22 0940  Weight: 269 lb (122 kg)     Objective:   Physical Exam Vitals and nursing note reviewed.  Constitutional:      General: She is not in acute distress. Neck:     Vascular: No JVD.  Cardiovascular:     Rate and Rhythm: Normal rate and regular rhythm.     Heart sounds: Normal heart sounds. No murmur heard. Pulmonary:     Effort: Pulmonary effort is normal.     Breath sounds: Normal breath sounds. No wheezing or rales.  Musculoskeletal:     Right lower leg: No edema.     Left lower leg: No edema.  Visit diagnoses:   ICD-10-CM   1. Essential hypertension  I10     2. Coronary artery disease of native artery of native heart with stable angina pectoris (HCC)  I25.118 EKG 12-Lead    isosorbide mononitrate (IMDUR) 60 MG 24 hr tablet    metoprolol tartrate (LOPRESSOR) 25 MG tablet    CBC    Lipid panel    3. Blood loss anemia  D50.0        Orders Placed This Encounter  Procedures   CBC   Lipid panel   EKG 12-Lead   Meds ordered this encounter  Medications   isosorbide mononitrate (IMDUR) 60 MG 24 hr tablet    Sig: Take 1 tablet (60 mg total) by mouth daily.    Dispense:  90 tablet    Refill:  1   metoprolol tartrate (LOPRESSOR) 25 MG tablet    Sig: Take 1 tablet (25 mg total) by mouth 2 (two) times daily.    Dispense:  180 tablet    Refill:  3     Assessment & Recommendations:    56 y/o African-American female with hypertension, type 2 diabetes mellitus, CAD  CAD: Stable angina symptoms with residual severe stenosis in OM1. On top of antianginal therapy, she possibly needs intervention.  However, with her anemia and recent vaginal bleeding, I will hold off intervention at this time. I have  resumed beta-blocker, now with metoprolol tartrate 25 mg twice daily.  I also changed isosorbide dinitrate 10 mg 3 times daily to isosorbide mononitrate 60 mg daily. It is important to address her anemia, as it could also cause angina and dyspnea symptoms. I have encouraged her to see OB/GYN ASAP.  I am deeply concerned about vaginal bleeding in postmenopausal patient.  Patient does mention that she had similar episode 10 years ago while on antiplatelet therapy. Depending on OB/GYN evaluation, we will then determine what is the optimal timing for coronary intervention, should she continue to have angina episodes on medical therapy, and resolution of anemia.  Should she undergo further intervention, she will need at least 1 month of dual antiplatelet therapy after that.  This would be important to note, as it may have impact on timing of any invasive work-up for her vaginal bleeding.  Mixed hyperlipidemia: Currently on Lipitor 20 mg daily, with no recent lipid panel.  We will check lipid panel. She does have elevated lipoprotein a.  I will refer her to ongoing clinical trials or patients with elevated lipoprotein a and recent ACS. Lipitor may in fact increase levels of lipoprotein a.  Therefore, I am not increased dose of Lipitor today, and will consider adding Repatha.  Hypertension: Fairly well controlled.   Time spent: 40-minute      [name], MD Pager: [phone] Office: [phone]

## 2022-04-22 ENCOUNTER — Other Ambulatory Visit: Payer: Self-pay

## 2022-04-28 ENCOUNTER — Inpatient Hospital Stay (INDEPENDENT_AMBULATORY_CARE_PROVIDER_SITE_OTHER): Payer: Medicaid Other | Admitting: Primary Care

## 2022-05-11 ENCOUNTER — Other Ambulatory Visit: Payer: Self-pay

## 2022-05-15 ENCOUNTER — Other Ambulatory Visit: Payer: Self-pay

## 2022-05-22 ENCOUNTER — Encounter: Payer: Self-pay | Admitting: Cardiology

## 2022-05-22 ENCOUNTER — Other Ambulatory Visit: Payer: Self-pay

## 2022-05-22 ENCOUNTER — Ambulatory Visit: Payer: Medicaid Other | Admitting: Cardiology

## 2022-05-22 VITALS — BP 175/91 | HR 69 | Resp 16 | Ht 61.0 in | Wt 253.2 lb

## 2022-05-22 DIAGNOSIS — I10 Essential (primary) hypertension: Secondary | ICD-10-CM

## 2022-05-22 DIAGNOSIS — I25118 Atherosclerotic heart disease of native coronary artery with other forms of angina pectoris: Secondary | ICD-10-CM

## 2022-05-22 DIAGNOSIS — D500 Iron deficiency anemia secondary to blood loss (chronic): Secondary | ICD-10-CM

## 2022-05-22 MED ORDER — ISOSORBIDE MONONITRATE ER 60 MG PO TB24
60.0000 mg | ORAL_TABLET | Freq: Every day | ORAL | 1 refills | Status: DC
  Filled 2022-05-22: qty 90, 90d supply, fill #0

## 2022-05-22 NOTE — Progress Notes (Signed)
Follow up visit  Subjective:   [name], female    DOB: 1966/05/26, 56 y.o.   MRN: [id number]     HPI  Chief Complaint  Patient presents with   Chest Pain   Follow-up    56 y/o African-American female with hypertension, type 2 diabetes mellitus, CAD, postmenopausal vaginal bleeding, anemia  Patient was hospitalized in May 2023 with chest pain, non-STEMI.  She underwent successful PCI to mid RCA by Dr. Jacinto Halim.  She also had known culprit vessel severe stenosis in OM1.  Unfortunately, I was not able to complete intervention to OM1 due to difficulty with guide engagement and access through right radial artery.  Therefore, procedure was aborted with potential plans for outpatient repeat PCI attempt through femoral access.   Since her PCI, patient presented to emergency room with left-sided chest pain and vaginal bleeding.  Of note, patient is postmenopausal.  She underwent labs that showed anemia with hemoglobin of 8, 2 points lower than her hospital discharge 2 weeks prior.  Fortunately, 2 troponins were negative, ACS was ruled out.  Patient waited in the ER and eventually left without being seen.  She has not seen any OB/GYN since then, but her vaginal bleeding has since stopped.  Patient continues to have episodes of chest pain and shortness of breath with exertion. It appears that she never took Imdur 60 mg prescribed at last visit, as pharmacy never informed the patient about this prescription. Blood pressure is generally well controlled, but elevated today. She attributes this to stress. She is going to undergo hysteroscopy and D&C in the near future by Dr. Unice Bailey with [facility] associates, subject to cardiac clearance. Fortunately, she is not having any bleeding at this time.     Current Outpatient Medications:    aspirin 81 MG tablet, Take 81 mg by mouth every morning. , Disp: , Rfl:    atorvastatin (LIPITOR) 20 MG tablet, Take 20 mg by mouth daily., Disp: , Rfl:     Ferrous Sulfate (IRON) 325 (65 FE) MG TABS, Take 1 tablet by mouth 2 (two) times daily.  , Disp: , Rfl:    furosemide (LASIX) 40 MG tablet, Take 40 mg by mouth daily. , Disp: , Rfl:    glipiZIDE (GLUCOTROL XL) 5 MG 24 hr tablet, Take 5 mg by mouth daily with breakfast., Disp: , Rfl:    hydrALAZINE (APRESOLINE) 25 MG tablet, Take 1 tablet (25 mg total) by mouth 3 (three) times daily., Disp: 90 tablet, Rfl: 1   isosorbide mononitrate (IMDUR) 60 MG 24 hr tablet, Take 1 tablet (60 mg total) by mouth daily., Disp: 90 tablet, Rfl: 1   leuprolide (LUPRON DEPOT, 63-MONTH,) 3.75 MG injection, , Disp: , Rfl:    metFORMIN (GLUCOPHAGE) 1000 MG tablet, Take 1,000 mg by mouth 2 (two) times daily with a meal., Disp: , Rfl:    metoprolol tartrate (LOPRESSOR) 25 MG tablet, Take 1 tablet (25 mg total) by mouth 2 (two) times daily., Disp: 180 tablet, Rfl: 3   ticagrelor (BRILINTA) 90 MG TABS tablet, Take 1 tablet (90 mg total) by mouth 2 (two) times daily., Disp: 60 tablet, Rfl: 6   Cardiovascular & other pertient studies:  Reviewed external labs and tests, independently interpreted  EKG 04/16/2022: Sinus rhythm 107 bpm Nonspecific T-abnormality  Coronary intervention 03/11/2022: Unsuccessful attempt due to poor guide engagement from radial access. Significant difficulty engaging any guide, requiring multiple catheters, requiring more than usual effort, radiation, contrast. Continue medical management, especially blood  pressure control. Will consider outpatient PCI to OM1 through femoral access.  Coronary intervention 03/10/2022:  LV: 168/80, EDP 19 mmHg.  Ao 196/90, mean 134 mmHg.  No pressure gradient across the aortic valve. LM: Moderate caliber vessel.  No significant disease. LAD: Moderate caliber vessel throughout.  Mild diffuse disease.  Gives origin to several small diagonals, diagonal 2 and 3 which arises as bifurcation in the midsegment of the LAD have mild ostial disease.  Mid to distal segment of the  LAD has a focal 40% stenosis. CX: Again a moderate caliber vessel giving origin to a very large territory distribution OM1, a 2.0 x 2.25 mm vessel with a high-grade ulcerated proximal tandem 95 to 99% stenosis.  OM 2 is very small caliber and again, moderate area of distribution, ostial 50% stenosis.  Distal circumflex has a 40% stenosis. RCA: The mid segment of the RCA has a stent from 2013 and a 2.75 x 23 mm Xience stent from 10/28/2019.  These are overlapping.  The candy wrapper stenosis both at the inflow and outflow of the old stent from 2013 (proximal ISR, distal new lesion).  There is a 60 to 70% proximal stenosis starting from the proximal segment of the previously placed stent from 2021.   Interventional data: Difficult procedure due to poor visualization and resilient in-stent restenosis.  2.75 x 15 mm score flex balloon angioplasty at 20 atmospheric pressure followed by stenting with a 2.75 x 32 mm Synergy XT DES covering the entire in-stent restenosis to outflow stenosis of previously placed stent from 2013.  Successful stenting of the proximal to mid RCA overlapping stent that was placed on 10/28/2019 and the distal edge with a 2.74 x 20 mm Synergy XD deployed at 16 atmospheric pressure.  The entire stented segment was postdilated to 20 atm pressure giving a 3 mm lumen.  Recommendation: Patient extremely high risk for recurrent restenosis and progression of coronary disease in view of uncontrolled hypertension, diabetes mellitus and noncompliance with medical advice.  Reiterate risks to the patient.  Aspirin indefinitely and Brilinta for at least 1 year in view of NSTEMI.  Patient will be scheduled for elective stage intervention to a very large OM1 tomorrow if she remains stable through the right.  Although OM1 is 2.0-2.25 mm vessel, has a very large area of distribution and a high-grade ulcerated 99% stenosis.  She is at risk for developing recurrent ACS if untreated.  175 mL contrast  utilized  Recent labs: 04/02/2022: Glucose 242, BUN/Cr 17/0.96. EGFR >60. Na/K 139/4.1.  H/H 8.2/25. MCV 76. Platelets 315 HbA1C 8.7% Lipid panel NA Lipoprotein a 196  Latest Reference Range & Units 04/02/22 15:48 04/02/22 16:08  Troponin I (High Sensitivity) <18 ng/L 11 10     Review of Systems  Cardiovascular:  Positive for chest pain and dyspnea on exertion. Negative for leg swelling, palpitations and syncope.       Vitals:   05/22/22 0845  BP: (!) 175/91  Pulse: 69  Resp: 16    Body mass index is 47.84 kg/m. Filed Weights   05/22/22 0845  Weight: 253 lb 3.2 oz (114.9 kg)     Objective:   Physical Exam Vitals and nursing note reviewed.  Constitutional:      General: She is not in acute distress. Neck:     Vascular: No JVD.  Cardiovascular:     Rate and Rhythm: Normal rate and regular rhythm.     Heart sounds: Normal heart sounds. No murmur heard. Pulmonary:  Effort: Pulmonary effort is normal.     Breath sounds: Normal breath sounds. No wheezing or rales.  Musculoskeletal:     Right lower leg: No edema.     Left lower leg: No edema.            Visit diagnoses:   ICD-10-CM   1. Coronary artery disease of native artery of native heart with stable angina pectoris ([place])  I25.118     2. Essential hypertension  I10     3. Blood loss anemia  D50.0        Orders Placed This Encounter  Procedures   CBC   Basic metabolic panel   Meds ordered this encounter  Medications   isosorbide mononitrate (IMDUR) 60 MG 24 hr tablet    Sig: Take 1 tablet (60 mg total) by mouth daily.    Dispense:  90 tablet    Refill:  1     Assessment & Recommendations:   56 y/o African-American female with hypertension, type 2 diabetes mellitus, CAD, postmenopausal vaginal bleeding, anemia  CAD: Continues to have stable angina symptoms, even without ongoing vaginal bleeding. In addition to metoprolol tartrate 25 mg bid, added Imdur 60 mg daily. I emphasized  checking with her pharmacy again. Given her ongoing anginal symptoms and presence of high-grade stenosis in OM1, her preoperative cardiac risk is elevated.  Now that her bleeding has stopped, this may be our best opportunity to reattempt revascularization in OM1.  Previously, I was unable to complete this through radial access.  I plan to perform the procedure through femoral access and use of guide extension catheter.  If results are satisfactory with angioplasty alone, I may defer stenting to avoid risk of stent thrombosis with interruption of dual antiplatelet therapy.  Potentially, after successful angioplasty, we can interrupt dual antiplatelet therapy after 1 month and that the patient proceed with hysteroscopy and D&C at that time.  However, I would certainly await the above procedure prior to attempting surgery under general anesthesia. Obviously, possibility remains if she has recurrent bleeding after the angioplasty. In which case, we would have no option but to interrupt antiplatelet therapy and proceed with D&C.  Benefits, risks, alternate options discussed with the patient in detail.   I will convey my recommendations to her OB/GYN physician Dr. Melba Coon.  Mixed hyperlipidemia: Currently on Lipitor 20 mg daily, with no recent lipid panel.  Check lipid panel.  Hypertension: Generally well controlled, likely up today due to stress    [name], MD Pager: [phone] Office: [phone]

## 2022-05-23 LAB — BASIC METABOLIC PANEL
BUN/Creatinine Ratio: 17 (ref 9–23)
BUN: 15 mg/dL (ref 6–24)
CO2: 22 mmol/L (ref 20–29)
Calcium: 9.5 mg/dL (ref 8.7–10.2)
Chloride: 102 mmol/L (ref 96–106)
Creatinine, Ser: 0.9 mg/dL (ref 0.57–1.00)
Glucose: 209 mg/dL — ABNORMAL HIGH (ref 70–99)
Potassium: 4.5 mmol/L (ref 3.5–5.2)
Sodium: 139 mmol/L (ref 134–144)
eGFR: 76 mL/min/{1.73_m2} (ref >59)

## 2022-05-23 LAB — CBC
Hematocrit: 28.2 % — ABNORMAL LOW (ref 34.0–46.6)
Hemoglobin: 9 g/dL — ABNORMAL LOW (ref 11.1–15.9)
MCH: 23.6 pg — ABNORMAL LOW (ref 26.6–33.0)
MCHC: 31.9 g/dL (ref 31.5–35.7)
MCV: 74 fL — ABNORMAL LOW (ref 79–97)
Platelets: 343 10*3/uL (ref 150–450)
RBC: 3.81 x10E6/uL (ref 3.77–5.28)
RDW: 13.5 % (ref 11.7–15.4)
WBC: 4.8 10*3/uL (ref 3.4–10.8)

## 2022-05-25 ENCOUNTER — Other Ambulatory Visit: Payer: Self-pay

## 2022-05-26 ENCOUNTER — Other Ambulatory Visit: Payer: Self-pay

## 2022-05-26 ENCOUNTER — Encounter (HOSPITAL_COMMUNITY)
Admission: RE | Disposition: A | Discharge status: Home / Self Care | Payer: Self-pay | Source: Home / Self Care | Attending: Cardiology

## 2022-05-26 ENCOUNTER — Encounter (HOSPITAL_COMMUNITY): Payer: Self-pay | Admitting: Cardiology

## 2022-05-26 ENCOUNTER — Ambulatory Visit (HOSPITAL_COMMUNITY)
Admission: RE | Admit: 2022-05-26 | Discharge: 2022-05-27 | Disposition: A | Discharge status: Home / Self Care | Payer: Medicaid Other | Attending: Cardiology | Admitting: Cardiology

## 2022-05-26 DIAGNOSIS — I252 Old myocardial infarction: Secondary | ICD-10-CM | POA: Diagnosis not present

## 2022-05-26 DIAGNOSIS — E119 Type 2 diabetes mellitus without complications: Secondary | ICD-10-CM | POA: Diagnosis not present

## 2022-05-26 DIAGNOSIS — D500 Iron deficiency anemia secondary to blood loss (chronic): Secondary | ICD-10-CM | POA: Diagnosis not present

## 2022-05-26 DIAGNOSIS — Z79899 Other long term (current) drug therapy: Secondary | ICD-10-CM | POA: Diagnosis not present

## 2022-05-26 DIAGNOSIS — I10 Essential (primary) hypertension: Secondary | ICD-10-CM | POA: Diagnosis not present

## 2022-05-26 DIAGNOSIS — I25118 Atherosclerotic heart disease of native coronary artery with other forms of angina pectoris: Secondary | ICD-10-CM | POA: Diagnosis present

## 2022-05-26 DIAGNOSIS — E782 Mixed hyperlipidemia: Secondary | ICD-10-CM | POA: Insufficient documentation

## 2022-05-26 DIAGNOSIS — Z7984 Long term (current) use of oral hypoglycemic drugs: Secondary | ICD-10-CM | POA: Insufficient documentation

## 2022-05-26 DIAGNOSIS — Z9861 Coronary angioplasty status: Secondary | ICD-10-CM

## 2022-05-26 HISTORY — PX: INTRAVASCULAR ULTRASOUND/IVUS: CATH118244

## 2022-05-26 HISTORY — PX: CORONARY BALLOON ANGIOPLASTY: CATH118233

## 2022-05-26 LAB — GLUCOSE, CAPILLARY
Glucose-Capillary: 180 mg/dL — ABNORMAL HIGH (ref 70–99)
Glucose-Capillary: 257 mg/dL — ABNORMAL HIGH (ref 70–99)
Glucose-Capillary: 247 mg/dL — ABNORMAL HIGH (ref 70–99)
Glucose-Capillary: 287 mg/dL — ABNORMAL HIGH (ref 70–99)
Glucose-Capillary: 183 mg/dL — ABNORMAL HIGH (ref 70–99)

## 2022-05-26 LAB — POCT ACTIVATED CLOTTING TIME: Activated Clotting Time: 630 seconds

## 2022-05-26 SURGERY — INTRAVASCULAR ULTRASOUND/IVUS
Anesthesia: LOCAL

## 2022-05-26 MED ORDER — NITROGLYCERIN IN D5W 200-5 MCG/ML-% IV SOLN
INTRAVENOUS | Status: AC
Start: 2022-05-26 — End: ?
  Filled 2022-05-26: qty 250

## 2022-05-26 MED ORDER — FENTANYL CITRATE (PF) 100 MCG/2ML IJ SOLN
INTRAMUSCULAR | Status: AC
  Filled 2022-05-26: qty 2

## 2022-05-26 MED ORDER — NITROGLYCERIN 1 MG/10 ML FOR IR/CATH LAB
INTRA_ARTERIAL | Status: DC | PRN
  Administered 2022-05-26: 100 ug via INTRACORONARY
  Administered 2022-05-26 (×2): 200 ug via INTRACORONARY
  Administered 2022-05-26: 150 ug via INTRACORONARY
  Administered 2022-05-26: 200 ug via INTRACORONARY

## 2022-05-26 MED ORDER — SODIUM CHLORIDE 0.9% FLUSH: 3.0000 mL | INTRAVENOUS | Status: DC | PRN

## 2022-05-26 MED ORDER — HYDRALAZINE HCL 20 MG/ML IJ SOLN
INTRAMUSCULAR | Status: AC
  Filled 2022-05-26: qty 1

## 2022-05-26 MED ORDER — HEPARIN (PORCINE) IN NACL 1000-0.9 UT/500ML-% IV SOLN
INTRAVENOUS | Status: DC | PRN
  Administered 2022-05-26 (×2): 500 mL

## 2022-05-26 MED ORDER — TICAGRELOR 90 MG PO TABS
90.0000 mg | ORAL_TABLET | Freq: Two times a day (BID) | ORAL | Status: DC
  Administered 2022-05-26 – 2022-05-27 (×2): 90 mg via ORAL
  Filled 2022-05-26 (×2): qty 1

## 2022-05-26 MED ORDER — SODIUM CHLORIDE 0.9% FLUSH
3.0000 mL | Freq: Two times a day (BID) | INTRAVENOUS | Status: DC
  Administered 2022-05-26: 3 mL via INTRAVENOUS

## 2022-05-26 MED ORDER — HEPARIN (PORCINE) IN NACL 1000-0.9 UT/500ML-% IV SOLN
INTRAVENOUS | Status: AC
  Filled 2022-05-26: qty 500

## 2022-05-26 MED ORDER — LIDOCAINE HCL (PF) 1 % IJ SOLN
INTRAMUSCULAR | Status: DC | PRN
  Administered 2022-05-26: 10 mL

## 2022-05-26 MED ORDER — LABETALOL HCL 5 MG/ML IV SOLN: 10.0000 mg | INTRAVENOUS | Status: AC | PRN

## 2022-05-26 MED ORDER — SODIUM CHLORIDE 0.9 % IV SOLN
INTRAVENOUS | Status: DC | PRN
  Administered 2022-05-26: 1.75 mg/kg/h
  Administered 2022-05-26: 1.75 mg/kg/h via INTRAVENOUS

## 2022-05-26 MED ORDER — SODIUM CHLORIDE 0.9 % IV SOLN: 250.0000 mL | INTRAVENOUS | Status: DC | PRN

## 2022-05-26 MED ORDER — SODIUM CHLORIDE 0.9% FLUSH
3.0000 mL | Freq: Two times a day (BID) | INTRAVENOUS | Status: DC
Start: 2022-05-26 — End: 2022-05-27
  Administered 2022-05-26 – 2022-05-27 (×2): 3 mL via INTRAVENOUS

## 2022-05-26 MED ORDER — ONDANSETRON HCL 4 MG/2ML IJ SOLN: 4.0000 mg | Freq: Four times a day (QID) | INTRAMUSCULAR | Status: DC | PRN

## 2022-05-26 MED ORDER — NITROGLYCERIN 1 MG/10 ML FOR IR/CATH LAB
INTRA_ARTERIAL | Status: AC
  Filled 2022-05-26: qty 10

## 2022-05-26 MED ORDER — ISOSORB DINITRATE-HYDRALAZINE 20-37.5 MG PO TABS
1.0000 | ORAL_TABLET | Freq: Three times a day (TID) | ORAL | Status: DC
Start: 2022-05-26 — End: 2022-05-27
  Administered 2022-05-26 – 2022-05-27 (×3): 1 via ORAL
  Filled 2022-05-26 (×3): qty 1

## 2022-05-26 MED ORDER — ASPIRIN 81 MG PO CHEW
81.0000 mg | CHEWABLE_TABLET | Freq: Every morning | ORAL | Status: DC
  Administered 2022-05-27: 81 mg via ORAL
  Filled 2022-05-26: qty 1

## 2022-05-26 MED ORDER — ASPIRIN 81 MG PO CHEW: 81.0000 mg | CHEWABLE_TABLET | ORAL | Status: AC

## 2022-05-26 MED ORDER — MIDAZOLAM HCL 2 MG/2ML IJ SOLN
INTRAMUSCULAR | Status: DC | PRN
  Administered 2022-05-26: 1 mg via INTRAVENOUS

## 2022-05-26 MED ORDER — HEPARIN SODIUM (PORCINE) 1000 UNIT/ML IJ SOLN
INTRAMUSCULAR | Status: AC
  Filled 2022-05-26: qty 10

## 2022-05-26 MED ORDER — INSULIN ASPART 100 UNIT/ML IJ SOLN
0.0000 [IU] | Freq: Three times a day (TID) | INTRAMUSCULAR | Status: DC
  Administered 2022-05-26: 11 [IU] via SUBCUTANEOUS
  Administered 2022-05-26 – 2022-05-27 (×2): 7 [IU] via SUBCUTANEOUS

## 2022-05-26 MED ORDER — ACETAMINOPHEN 325 MG PO TABS
650.0000 mg | ORAL_TABLET | ORAL | Status: DC | PRN
  Administered 2022-05-26: 650 mg via ORAL
  Filled 2022-05-26: qty 2

## 2022-05-26 MED ORDER — ONDANSETRON HCL 4 MG/2ML IJ SOLN
INTRAMUSCULAR | Status: DC | PRN
  Administered 2022-05-26: 4 mg via INTRAVENOUS

## 2022-05-26 MED ORDER — FERROUS SULFATE 325 (65 FE) MG PO TABS
325.0000 mg | ORAL_TABLET | Freq: Two times a day (BID) | ORAL | Status: DC
  Administered 2022-05-26 – 2022-05-27 (×3): 325 mg via ORAL
  Filled 2022-05-26 (×3): qty 1

## 2022-05-26 MED ORDER — SODIUM CHLORIDE 0.9 % WEIGHT BASED INFUSION
1.0000 mL/kg/h | INTRAVENOUS | Status: DC
  Administered 2022-05-26: 1 mL/kg/h via INTRAVENOUS

## 2022-05-26 MED ORDER — SODIUM CHLORIDE 0.9 % IV SOLN: INTRAVENOUS | Status: AC

## 2022-05-26 MED ORDER — LIDOCAINE HCL (PF) 1 % IJ SOLN
INTRAMUSCULAR | Status: AC
  Filled 2022-05-26: qty 30

## 2022-05-26 MED ORDER — FENTANYL CITRATE (PF) 100 MCG/2ML IJ SOLN
INTRAMUSCULAR | Status: DC | PRN
  Administered 2022-05-26 (×2): 50 ug via INTRAVENOUS

## 2022-05-26 MED ORDER — INSULIN ASPART 100 UNIT/ML IJ SOLN: 0.0000 [IU] | Freq: Every day | INTRAMUSCULAR | Status: DC

## 2022-05-26 MED ORDER — LABETALOL HCL 5 MG/ML IV SOLN
INTRAVENOUS | Status: DC | PRN
  Administered 2022-05-26 (×2): 10 mg via INTRAVENOUS

## 2022-05-26 MED ORDER — ONDANSETRON HCL 4 MG/2ML IJ SOLN
INTRAMUSCULAR | Status: AC
Start: 2022-05-26 — End: ?
  Filled 2022-05-26: qty 2

## 2022-05-26 MED ORDER — SODIUM CHLORIDE 0.9 % WEIGHT BASED INFUSION: 3.0000 mL/kg/h | INTRAVENOUS | Status: AC

## 2022-05-26 MED ORDER — BIVALIRUDIN BOLUS VIA INFUSION - CUPID
INTRAVENOUS | Status: DC | PRN
  Administered 2022-05-26: 85.725 mg via INTRAVENOUS

## 2022-05-26 MED ORDER — INSULIN ASPART 100 UNIT/ML IJ SOLN
6.0000 [IU] | Freq: Three times a day (TID) | INTRAMUSCULAR | Status: DC
  Administered 2022-05-26 – 2022-05-27 (×3): 6 [IU] via SUBCUTANEOUS

## 2022-05-26 MED ORDER — HYDRALAZINE HCL 25 MG PO TABS
25.0000 mg | ORAL_TABLET | Freq: Three times a day (TID) | ORAL | Status: DC
  Administered 2022-05-26: 25 mg via ORAL
  Filled 2022-05-26: qty 1

## 2022-05-26 MED ORDER — IOHEXOL 350 MG/ML SOLN
INTRAVENOUS | Status: DC | PRN
  Administered 2022-05-26: 255 mg

## 2022-05-26 MED ORDER — HYDRALAZINE HCL 20 MG/ML IJ SOLN
INTRAMUSCULAR | Status: DC | PRN
  Administered 2022-05-26: 10 mg via INTRAVENOUS
  Administered 2022-05-26: 20 mg via INTRAVENOUS

## 2022-05-26 MED ORDER — METOPROLOL TARTRATE 25 MG PO TABS
25.0000 mg | ORAL_TABLET | Freq: Two times a day (BID) | ORAL | Status: DC
  Administered 2022-05-26 – 2022-05-27 (×3): 25 mg via ORAL
  Filled 2022-05-26 (×3): qty 1

## 2022-05-26 MED ORDER — SODIUM CHLORIDE 0.9 % IV SOLN
INTRAVENOUS | Status: AC | PRN
  Administered 2022-05-26: 250 mL via INTRAVENOUS

## 2022-05-26 MED ORDER — HYDRALAZINE HCL 20 MG/ML IJ SOLN: 10.0000 mg | INTRAMUSCULAR | Status: AC | PRN

## 2022-05-26 MED ORDER — LABETALOL HCL 5 MG/ML IV SOLN
INTRAVENOUS | Status: AC
  Filled 2022-05-26: qty 4

## 2022-05-26 MED ORDER — ACETAMINOPHEN 325 MG PO TABS: 650.0000 mg | ORAL_TABLET | Freq: Four times a day (QID) | ORAL | Status: DC | PRN

## 2022-05-26 MED ORDER — AMLODIPINE BESYLATE 5 MG PO TABS
5.0000 mg | ORAL_TABLET | Freq: Every day | ORAL | Status: DC
  Administered 2022-05-26 – 2022-05-27 (×2): 5 mg via ORAL
  Filled 2022-05-26 (×2): qty 1

## 2022-05-26 MED ORDER — MIDAZOLAM HCL 2 MG/2ML IJ SOLN
INTRAMUSCULAR | Status: AC
  Filled 2022-05-26: qty 2

## 2022-05-26 MED ORDER — ATORVASTATIN CALCIUM 10 MG PO TABS
20.0000 mg | ORAL_TABLET | Freq: Every evening | ORAL | Status: DC
  Administered 2022-05-26: 20 mg via ORAL
  Filled 2022-05-26: qty 2

## 2022-05-26 SURGICAL SUPPLY — 26 items
BALLN SAPPHIRE 2.0X10 (BALLOONS) ×2
BALLN SCOREFLEX 2.0X15 (BALLOONS) ×2
BALLN WOLVERINE 2.00X6 (BALLOONS) ×2
BALLOON SAPPHIRE 2.0X10 (BALLOONS) IMPLANT
BALLOON SCOREFLEX 2.0X15 (BALLOONS) IMPLANT
BALLOON WOLVERINE 2.00X6 (BALLOONS) IMPLANT
CATH LAUNCHER 6FR EBU3.5 (CATHETERS) ×1 IMPLANT
CATH OPTICROSS HD (CATHETERS) ×1 IMPLANT
CLOSURE PERCLOSE PROSTYLE (VASCULAR PRODUCTS) ×1 IMPLANT
ELECT DEFIB PAD ADLT CADENCE (PAD) ×1 IMPLANT
FEM STOP ARCH (HEMOSTASIS) ×2
KIT ENCORE 26 ADVANTAGE (KITS) ×1 IMPLANT
KIT HEART LEFT (KITS) ×2 IMPLANT
KIT MICROPUNCTURE NIT STIFF (SHEATH) ×1 IMPLANT
PACK CARDIAC CATHETERIZATION (CUSTOM PROCEDURE TRAY) ×2 IMPLANT
SHEATH PINNACLE 6F 10CM (SHEATH) ×1 IMPLANT
SHEATH PROBE COVER 6X72 (BAG) ×1 IMPLANT
SLED PULL BACK IVUS (MISCELLANEOUS) ×1 IMPLANT
STENT ONYX FRONTIER 2.0X15 (Permanent Stent) IMPLANT
SYSTEM COMPRESSION FEMOSTOP (HEMOSTASIS) IMPLANT
TRANSDUCER W/STOPCOCK (MISCELLANEOUS) ×2 IMPLANT
TUBING CIL FLEX 10 FLL-RA (TUBING) ×2 IMPLANT
VALVE GUARDIAN II ~~LOC~~ HEMO (MISCELLANEOUS) ×1 IMPLANT
WIRE ASAHI PROWATER 180CM (WIRE) ×1 IMPLANT
WIRE EMERALD 3MM-J .035X150CM (WIRE) ×1 IMPLANT
WIRE RUNTHROUGH .014X180CM (WIRE) ×1 IMPLANT

## 2022-05-26 NOTE — Progress Notes (Addendum)
Femostop removed at 12:05:00.  No hematoma, wound track oozing present, site level 1. Tegaderm dressing applied, tape pressure dressing applied.   Small skin tear proximal to catheter insertion site.  Less than 1/2 inch.  Bilateral dp and pt pulses palpable.   Bedrest begins at 12:15:00

## 2022-05-26 NOTE — Progress Notes (Signed)
Pt received from cath lab AxOx4, VS as per flow. BG 247. (R) groin pressure dressing in place C/D/I. Pt oriented to 6E processes. Pt familiar with the [facility] system. All questions and concerns addressed. Call bell placed within reach, will continue to monitor and maintain safety.

## 2022-05-26 NOTE — Progress Notes (Signed)
Patient in holding area femostop present on right groin. Femostop '@100mmhg'$ , palpable bilateral dp pulses present. Systolic bp 786.

## 2022-05-26 NOTE — Interval H&P Note (Signed)
History and Physical Interval Note:  05/26/2022 7:45 AM  [name]  has presented today for surgery, with the diagnosis of chest pain, shortness of breath.  The various methods of treatment have been discussed with the patient and family. After consideration of risks, benefits and other options for treatment, the patient has consented to  Procedure(s): CORONARY STENT INTERVENTION (N/A) as a surgical intervention.  The patient's history has been reviewed, patient examined, no change in status, stable for surgery.  I have reviewed the patient's chart and labs.  Questions were answered to the patient's satisfaction.    2016/2017 Appropriate Use Criteria for Coronary Revascularization Symptom Status: Ischemic Symptoms  Non-invasive Testing: Intermediate Risk  If no or indeterminate stress test, FFR/iFR results in all diseased vessels: N/A  Diabetes Mellitus: Yes  S/P CABG: No  Antianginal therapy (number of long-acting drugs): >=2  Patient undergoing renal transplant: No  Patient undergoing percutaneous valve procedure: No  1 Vessel Disease PCI CABG  No proximal LAD involvement, No proximal left dominant LCX involvement A (8); Indication 2 M (6); Indication 2  Proximal left dominant LCX involvement A (8); Indication 5 A (8); Indication 5  Proximal LAD involvement A (8); Indication 5 A (8); Indication 5  2 Vessel Disease  No proximal LAD involvement A (8); Indication 8 A (7); Indication 8  Proximal LAD involvement A (8); Indication 14 A (9); Indication 14  3 Vessel Disease  Low disease complexity (e.g., focal stenoses, SYNTAX <=22) A (7); Indication 19 A (9); Indication 19  Intermediate or high disease complexity (e.g., SYNTAX >=23) M (6); Indication 23 A (9); Indication 23  Left Main Disease  Isolated LMCA disease: ostial or midshaft A (7); Indication 24 A (9); Indication 24  Isolated LMCA disease: bifurcation involvement M (6); Indication 25 A (9); Indication 25  LMCA ostial or  midshaft, concurrent low disease burden multivessel disease (e.g., 1-2 additional focal stenoses, SYNTAX <=22) A (7); Indication 26 A (9); Indication 26  LMCA ostial or midshaft, concurrent intermediate or high disease burden multivessel disease (e.g., 1-2 additional bifurcation stenoses, long stenoses, SYNTAX >=23) M (4); Indication 27 A (9); Indication 27  LMCA bifurcation involvement, concurrent low disease burden multivessel disease (e.g., 1-2 additional focal stenoses, SYNTAX <=22) M (6); Indication 28 A (9); Indication 28  LMCA bifurcation involvement, concurrent intermediate or high disease burden multivessel disease (e.g., 1-2 additional bifurcation stenoses, long stenoses, SYNTAX >=23) R (3); Indication 29 A (9); Indication [place]

## 2022-05-26 NOTE — Progress Notes (Signed)
Purewick placed after pt request to void, tolerated well, safety maintained

## 2022-05-26 NOTE — H&P (Signed)
OV 05/22/2022 copied for documentation    Follow up visit  Subjective:   [name], female    DOB: Aug 07, 1966, 56 y.o.   MRN: [id number]     HPI  Chief Complaint  Patient presents with   Chest Pain   Follow-up    56 y/o African-American female with hypertension, type 2 diabetes mellitus, CAD, postmenopausal vaginal bleeding, anemia  Patient was hospitalized in May 2023 with chest pain, non-STEMI.  She underwent successful PCI to mid RCA by Dr. Einar Gip.  She also had known culprit vessel severe stenosis in OM1.  Unfortunately, I was not able to complete intervention to OM1 due to difficulty with guide engagement and access through right radial artery.  Therefore, procedure was aborted with potential plans for outpatient repeat PCI attempt through femoral access.   Since her PCI, patient presented to emergency room with left-sided chest pain and vaginal bleeding.  Of note, patient is postmenopausal.  She underwent labs that showed anemia with hemoglobin of 8, 2 points lower than her hospital discharge 2 weeks prior.  Fortunately, 2 troponins were negative, ACS was ruled out.  Patient waited in the ER and eventually left without being seen.  She has not seen any OB/GYN since then, but her vaginal bleeding has since stopped.  Patient continues to have episodes of chest pain and shortness of breath with exertion. It appears that she never took Imdur 60 mg prescribed at last visit, as pharmacy never informed the patient about this prescription. Blood pressure is generally well controlled, but elevated today. She attributes this to stress. She is going to undergo hysteroscopy and D&C in the near future by Dr. Reynolds Bowl with [facility], subject to cardiac clearance. Fortunately, she is not having any bleeding at this time.     Current Outpatient Medications:    aspirin 81 MG tablet, Take 81 mg by mouth every morning. , Disp: , Rfl:    atorvastatin (LIPITOR) 20 MG tablet, Take  20 mg by mouth daily., Disp: , Rfl:    Ferrous Sulfate (IRON) 325 (65 FE) MG TABS, Take 1 tablet by mouth 2 (two) times daily.  , Disp: , Rfl:    furosemide (LASIX) 40 MG tablet, Take 40 mg by mouth daily. , Disp: , Rfl:    glipiZIDE (GLUCOTROL XL) 5 MG 24 hr tablet, Take 5 mg by mouth daily with breakfast., Disp: , Rfl:    hydrALAZINE (APRESOLINE) 25 MG tablet, Take 1 tablet (25 mg total) by mouth 3 (three) times daily., Disp: 90 tablet, Rfl: 1   isosorbide mononitrate (IMDUR) 60 MG 24 hr tablet, Take 1 tablet (60 mg total) by mouth daily., Disp: 90 tablet, Rfl: 1   leuprolide (LUPRON DEPOT, 17-MONTH,) 3.75 MG injection, , Disp: , Rfl:    metFORMIN (GLUCOPHAGE) 1000 MG tablet, Take 1,000 mg by mouth 2 (two) times daily with a meal., Disp: , Rfl:    metoprolol tartrate (LOPRESSOR) 25 MG tablet, Take 1 tablet (25 mg total) by mouth 2 (two) times daily., Disp: 180 tablet, Rfl: 3   ticagrelor (BRILINTA) 90 MG TABS tablet, Take 1 tablet (90 mg total) by mouth 2 (two) times daily., Disp: 60 tablet, Rfl: 6   Cardiovascular & other pertient studies:  Reviewed external labs and tests, independently interpreted  EKG 04/16/2022: Sinus rhythm 107 bpm Nonspecific T-abnormality  Coronary intervention 03/11/2022: Unsuccessful attempt due to poor guide engagement from radial access. Significant difficulty engaging any guide, requiring multiple catheters, requiring more than usual effort,  radiation, contrast. Continue medical management, especially blood pressure control. Will consider outpatient PCI to OM1 through femoral access.  Coronary intervention 03/10/2022:  LV: 168/80, EDP 19 mmHg.  Ao 196/90, mean 134 mmHg.  No pressure gradient across the aortic valve. LM: Moderate caliber vessel.  No significant disease. LAD: Moderate caliber vessel throughout.  Mild diffuse disease.  Gives origin to several small diagonals, diagonal 2 and 3 which arises as bifurcation in the midsegment of the LAD have mild  ostial disease.  Mid to distal segment of the LAD has a focal 40% stenosis. CX: Again a moderate caliber vessel giving origin to a very large territory distribution OM1, a 2.0 x 2.25 mm vessel with a high-grade ulcerated proximal tandem 95 to 99% stenosis.  OM 2 is very small caliber and again, moderate area of distribution, ostial 50% stenosis.  Distal circumflex has a 40% stenosis. RCA: The mid segment of the RCA has a stent from 2013 and a 2.75 x 23 mm Xience stent from 10/28/2019.  These are overlapping.  The candy wrapper stenosis both at the inflow and outflow of the old stent from 2013 (proximal ISR, distal new lesion).  There is a 60 to 70% proximal stenosis starting from the proximal segment of the previously placed stent from 2021.   Interventional data: Difficult procedure due to poor visualization and resilient in-stent restenosis.  2.75 x 15 mm score flex balloon angioplasty at 20 atmospheric pressure followed by stenting with a 2.75 x 32 mm Synergy XT DES covering the entire in-stent restenosis to outflow stenosis of previously placed stent from 2013.  Successful stenting of the proximal to mid RCA overlapping stent that was placed on 10/28/2019 and the distal edge with a 2.74 x 20 mm Synergy XD deployed at 16 atmospheric pressure.  The entire stented segment was postdilated to 20 atm pressure giving a 3 mm lumen.  Recommendation: Patient extremely high risk for recurrent restenosis and progression of coronary disease in view of uncontrolled hypertension, diabetes mellitus and noncompliance with medical advice.  Reiterate risks to the patient.  Aspirin indefinitely and Brilinta for at least 1 year in view of NSTEMI.  Patient will be scheduled for elective stage intervention to a very large OM1 tomorrow if she remains stable through the right.  Although OM1 is 2.0-2.25 mm vessel, has a very large area of distribution and a high-grade ulcerated 99% stenosis.  She is at risk for developing recurrent  ACS if untreated.  175 mL contrast utilized  Recent labs: 04/02/2022: Glucose 242, BUN/Cr 17/0.96. EGFR >60. Na/K 139/4.1.  H/H 8.2/25. MCV 76. Platelets 315 HbA1C 8.7% Lipid panel NA Lipoprotein a 196  Latest Reference Range & Units 04/02/22 15:48 04/02/22 16:08  Troponin I (High Sensitivity) <18 ng/L 11 10     Review of Systems  Cardiovascular:  Positive for chest pain and dyspnea on exertion. Negative for leg swelling, palpitations and syncope.       Vitals:   05/22/22 0845  BP: (!) 175/91  Pulse: 69  Resp: 16    Body mass index is 47.84 kg/m. Filed Weights   05/22/22 0845  Weight: 253 lb 3.2 oz (114.9 kg)     Objective:   Physical Exam Vitals and nursing note reviewed.  Constitutional:      General: She is not in acute distress. Neck:     Vascular: No JVD.  Cardiovascular:     Rate and Rhythm: Normal rate and regular rhythm.     Heart sounds: Normal  heart sounds. No murmur heard. Pulmonary:     Effort: Pulmonary effort is normal.     Breath sounds: Normal breath sounds. No wheezing or rales.  Musculoskeletal:     Right lower leg: No edema.     Left lower leg: No edema.             Visit diagnoses:   ICD-10-CM   1. Coronary artery disease of native artery of native heart with stable angina pectoris ([place])  I25.118     2. Essential hypertension  I10     3. Blood loss anemia  D50.0        Orders Placed This Encounter  Procedures   CBC   Basic metabolic panel   Meds ordered this encounter  Medications   isosorbide mononitrate (IMDUR) 60 MG 24 hr tablet    Sig: Take 1 tablet (60 mg total) by mouth daily.    Dispense:  90 tablet    Refill:  1     Assessment & Recommendations:   56 y/o African-American female with hypertension, type 2 diabetes mellitus, CAD, postmenopausal vaginal bleeding, anemia  CAD: Continues to have stable angina symptoms, even without ongoing vaginal bleeding. In addition to metoprolol tartrate 25 mg bid, added  Imdur 60 mg daily. I emphasized checking with her pharmacy again. Given her ongoing anginal symptoms and presence of high-grade stenosis in OM1, her preoperative cardiac risk is elevated.  Now that her bleeding has stopped, this may be our best opportunity to reattempt revascularization in OM1.  Previously, I was unable to complete this through radial access.  I plan to perform the procedure through femoral access and use of guide extension catheter.  If results are satisfactory with angioplasty alone, I may defer stenting to avoid risk of stent thrombosis with interruption of dual antiplatelet therapy.  Potentially, after successful angioplasty, we can interrupt dual antiplatelet therapy after 1 month and that the patient proceed with hysteroscopy and D&C at that time.  However, I would certainly await the above procedure prior to attempting surgery under general anesthesia. Obviously, possibility remains if she has recurrent bleeding after the angioplasty. In which case, we would have no option but to interrupt antiplatelet therapy and proceed with D&C.  Benefits, risks, alternate options discussed with the patient in detail.   I will convey my recommendations to her OB/GYN physician Dr. Melba Coon.  Mixed hyperlipidemia: Currently on Lipitor 20 mg daily, with no recent lipid panel.  Check lipid panel.  Hypertension: Generally well controlled, likely up today due to stress    [name], MD Pager: [phone] Office: [phone]

## 2022-05-27 ENCOUNTER — Other Ambulatory Visit (HOSPITAL_COMMUNITY): Payer: Self-pay

## 2022-05-27 ENCOUNTER — Other Ambulatory Visit: Payer: Self-pay

## 2022-05-27 ENCOUNTER — Other Ambulatory Visit: Payer: Self-pay | Admitting: Cardiology

## 2022-05-27 DIAGNOSIS — D649 Anemia, unspecified: Secondary | ICD-10-CM

## 2022-05-27 DIAGNOSIS — I25118 Atherosclerotic heart disease of native coronary artery with other forms of angina pectoris: Secondary | ICD-10-CM | POA: Diagnosis not present

## 2022-05-27 DIAGNOSIS — E119 Type 2 diabetes mellitus without complications: Secondary | ICD-10-CM | POA: Diagnosis not present

## 2022-05-27 DIAGNOSIS — I10 Essential (primary) hypertension: Secondary | ICD-10-CM | POA: Diagnosis not present

## 2022-05-27 DIAGNOSIS — I252 Old myocardial infarction: Secondary | ICD-10-CM | POA: Diagnosis not present

## 2022-05-27 LAB — BASIC METABOLIC PANEL
Anion gap: 9 (ref 5–15)
BUN: 15 mg/dL (ref 6–20)
CO2: 20 mmol/L — ABNORMAL LOW (ref 22–32)
Calcium: 8.4 mg/dL — ABNORMAL LOW (ref 8.9–10.3)
Chloride: 110 mmol/L (ref 98–111)
Creatinine, Ser: 1 mg/dL (ref 0.44–1.00)
GFR, Estimated: >60 mL/min (ref >60)
Glucose, Bld: 238 mg/dL — ABNORMAL HIGH (ref 70–99)
Potassium: 3.8 mmol/L (ref 3.5–5.1)
Sodium: 139 mmol/L (ref 135–145)

## 2022-05-27 LAB — LIPID PANEL
Cholesterol: 143 mg/dL (ref 0–200)
HDL: 35 mg/dL — ABNORMAL LOW (ref >40)
LDL Cholesterol: 94 mg/dL (ref 0–99)
Total CHOL/HDL Ratio: 4.1 RATIO
Triglycerides: 69 mg/dL (ref <150)
VLDL: 14 mg/dL (ref 0–40)

## 2022-05-27 LAB — CBC
HCT: 22.8 % — ABNORMAL LOW (ref 36.0–46.0)
Hemoglobin: 7.4 g/dL — ABNORMAL LOW (ref 12.0–15.0)
MCH: 23.6 pg — ABNORMAL LOW (ref 26.0–34.0)
MCHC: 32.5 g/dL (ref 30.0–36.0)
MCV: 72.8 fL — ABNORMAL LOW (ref 80.0–100.0)
Platelets: 258 10*3/uL (ref 150–400)
RBC: 3.13 MIL/uL — ABNORMAL LOW (ref 3.87–5.11)
RDW: 14.5 % (ref 11.5–15.5)
WBC: 6.3 10*3/uL (ref 4.0–10.5)
nRBC: 0 % (ref 0.0–0.2)

## 2022-05-27 LAB — GLUCOSE, CAPILLARY: Glucose-Capillary: 293 mg/dL — ABNORMAL HIGH (ref 70–99)

## 2022-05-27 MED ORDER — AMLODIPINE BESYLATE 5 MG PO TABS
5.0000 mg | ORAL_TABLET | Freq: Every day | ORAL | 1 refills | Status: DC
  Filled 2022-05-27: qty 30, 30d supply, fill #0
  Filled 2022-06-25: qty 30, 30d supply, fill #1

## 2022-05-27 MED ORDER — ATORVASTATIN CALCIUM 40 MG PO TABS
40.0000 mg | ORAL_TABLET | Freq: Every evening | ORAL | 3 refills | Status: DC
  Filled 2022-05-27 – 2022-06-05 (×2): qty 90, 90d supply, fill #0

## 2022-05-27 MED ORDER — ISOSORB DINITRATE-HYDRALAZINE 20-37.5 MG PO TABS
1.0000 | ORAL_TABLET | Freq: Three times a day (TID) | ORAL | 1 refills | Status: DC
  Filled 2022-05-27: qty 90, 30d supply, fill #0

## 2022-05-27 NOTE — Plan of Care (Signed)
  Problem: Cardiovascular: Goal: Ability to achieve and maintain adequate cardiovascular perfusion will improve Outcome: Progressing Goal: Vascular access site(s) Level 0-1 will be maintained Outcome: Progressing   

## 2022-05-27 NOTE — Progress Notes (Addendum)
CARDIAC REHAB PHASE I   PRE:  Rate/Rhythm: 86 SR   BP:  Sitting: 175/79      SaO2: 96 RA   MODE:  Ambulation: 270 ft   POST:  Rate/Rhythm: 240  BP:  Sitting: 185/75      SaO2: 94 RA   Pt ambulated standby assist only no c/o of CP or SOB. Back to room to bed with call bell and bedside table in reach. Home education including site care , antiplatelet therapy importance, exercise guidelines, heart healthy diabetic diet, risk factors, restrictions, and CRP2. Pt will  be referred to [facility] for CRP2. All questions and concerns addressed. Pt getting ready for discharge home.  [id number]  Vanessa Barbara, RN BSN 05/27/2022 9:38 AM

## 2022-05-27 NOTE — Discharge Summary (Signed)
Physician Discharge Summary  Patient ID: [name] MRN: [id number] DOB/AGE: August 07, 1966 56 y.o.  Admit date: 05/26/2022 Discharge date: 05/27/2022  Primary Discharge Diagnosis: Coronary artery disease  Secondary Discharge Diagnosis: Hypertension Type 2 diabetes mellitus Iron deficiency anemia Obesity   Hospital Course:   56 y/o African-American female with hypertension, type 2 diabetes mellitus, CAD, postmenopausal vaginal bleeding, anemia  Patient had persistent angina symptoms in the setting of high grade OM1 lesion. Ongoing chest pain would increase her perioperative cardiac risk for D&C for menorrhagia causing blood loss anemia.I felt that we had a small window of opportunity while bleeding had stopped, in order to address her angina with intervention, in addition to medical therapy, followed by 4 week uninterrupted DAPT, before holding Brilinta prior to [facility]. Patient therefore underwent intervention on 05/26/2022.  She had good angioplasty results in OM1, stenting deferred due to residual calcification. Perclose closure achieved in Rt CFA ,but consistent ooze remained in the setting of deep track. She did have >150 cc blood loss, which likely refected in drop in hb this morning. However, there was no hematoma or concern for retroperitoneal bleeding. I expect her hemoglobin to improve with continued iron supplement. I will repeat CBC next week.   Patient has had increased blood pressures over the last two weeks. In addition, she had an asymptomatic [place] 2 block once this morning, probably while straining on toilet. I made the following changes.  I did not increase beta blocker Instead, added amlodipine 5 mg daily, changed hydralazine 25 mg tid to Bidil 20-37.5 mg tid.  Diabetes control remains crucial.  I checked lipid panel which resulted after patient's discharge. LDL 94, not at goal. Increase Lipitor to 40 mg daily. Given elevated lipoprotein (a), I will consider adding  Repatha, if LDL remains high at 3 months from now.   Patieny may hold Brilinta for 5 days after 06/26/2022, and proceed with D&C with acceptable cardiac risk.     Discharge Exam: Blood pressure (!) 186/75, pulse 86, temperature 98.7 F (37.1 C), temperature source Oral, resp. rate 15, height '5\' 2"'$  (1.575 m), weight 114.3 kg, last menstrual period 06/16/2019, SpO2 93 %.   Physical Exam Vitals and nursing note reviewed.  Constitutional:      General: She is not in acute distress.    Appearance: She is obese.  Neck:     Vascular: No JVD.  Cardiovascular:     Rate and Rhythm: Normal rate and regular rhythm.     Heart sounds: Normal heart sounds. No murmur heard. Pulmonary:     Effort: Pulmonary effort is normal.     Breath sounds: Normal breath sounds. No wheezing or rales.  Musculoskeletal:     Right lower leg: No edema.     Left lower leg: No edema.     Significant Diagnostic Studies:  EKG 05/27/2022: Normal sinus rhythm Nonspecific T wave abnormality Abnormal ECG  Coronary intervention 05/26/2022: OM1: 90% complex lesion Intravascular ultrasound Successful PTCA OM1 with 2.0X15 Scoreflex and 2.0X6 mm [place] cutting balloons   Perclose closure but continuous ooze remained. Femostop placed. Will monitor overnight.   FOLLOW UP PLANS AND APPOINTMENTS Discharge Instructions     Amb Referral to Cardiac Rehabilitation   Complete by: As directed    Diagnosis: PTCA   After initial evaluation and assessments completed: Virtual Based Care may be provided alone or in conjunction with Phase 2 Cardiac Rehab based on patient barriers.: Yes   Diet - low sodium heart healthy   Complete  by: As directed    Increase activity slowly   Complete by: As directed       Allergies as of 05/27/2022       Reactions   Detrol [tolterodine Tartrate] Rash        Medication List     STOP taking these medications    hydrALAZINE 25 MG tablet Commonly known as: APRESOLINE   isosorbide  mononitrate 60 MG 24 hr tablet Commonly known as: IMDUR       TAKE these medications    acetaminophen 325 MG tablet Commonly known as: TYLENOL Take 650 mg by mouth every 6 (six) hours as needed (pain.).   amLODipine 5 MG tablet Commonly known as: NORVASC Take 1 tablet (5 mg total) by mouth daily.   aspirin 81 MG chewable tablet Chew 81 mg by mouth in the morning.   atorvastatin 20 MG tablet Commonly known as: LIPITOR Take 20 mg by mouth every evening.   BiDil 20-37.5 MG tablet Generic drug: isosorbide-hydrALAZINE Take 1 tablet by mouth 3 (three) times daily.   Brilinta 90 MG Tabs tablet Generic drug: ticagrelor Take 1 tablet (90 mg total) by mouth 2 (two) times daily.   furosemide 40 MG tablet Commonly known as: LASIX Take 40 mg by mouth in the morning.   glipiZIDE 5 MG 24 hr tablet Commonly known as: GLUCOTROL XL Take 5 mg by mouth daily with breakfast.   Iron 325 (65 Fe) MG Tabs Take 325 mg by mouth 2 (two) times daily.   metFORMIN 1000 MG tablet Commonly known as: GLUCOPHAGE Take 1,000 mg by mouth 2 (two) times daily with a meal.   metoprolol tartrate 25 MG tablet Commonly known as: LOPRESSOR Take 1 tablet (25 mg total) by mouth 2 (two) times daily.   OZEMPIC (0.25 OR 0.5 MG/DOSE) [place] Inject 0.5 mg into the skin every Sunday.           [name], MD Pager: [phone] Office: [phone]

## 2022-06-02 ENCOUNTER — Other Ambulatory Visit: Payer: Self-pay

## 2022-06-03 ENCOUNTER — Telehealth (HOSPITAL_COMMUNITY): Payer: Self-pay

## 2022-06-04 ENCOUNTER — Other Ambulatory Visit: Payer: Self-pay

## 2022-06-04 ENCOUNTER — Telehealth: Payer: Self-pay | Admitting: Cardiology

## 2022-06-04 MED ORDER — FUROSEMIDE 40 MG PO TABS: 40.0000 mg | ORAL_TABLET | Freq: Every morning | ORAL | 6 refills | Status: DC

## 2022-06-04 NOTE — Telephone Encounter (Signed)
Patient says pcp does not do refills for furosemide and advised patient to have [place] refill that medication. Patient is asking this to be sent to [facility] on [address].

## 2022-06-04 NOTE — Telephone Encounter (Signed)
Refill has been sent.  °

## 2022-06-05 ENCOUNTER — Other Ambulatory Visit: Payer: Self-pay

## 2022-06-12 ENCOUNTER — Ambulatory Visit: Payer: Medicaid Other | Admitting: Cardiology

## 2022-06-12 ENCOUNTER — Encounter: Payer: Self-pay | Admitting: Cardiology

## 2022-06-12 VITALS — BP 137/81 | HR 100 | Temp 98.0°F | Resp 16 | Ht 62.0 in | Wt 254.0 lb

## 2022-06-12 DIAGNOSIS — I10 Essential (primary) hypertension: Secondary | ICD-10-CM

## 2022-06-12 DIAGNOSIS — I25118 Atherosclerotic heart disease of native coronary artery with other forms of angina pectoris: Secondary | ICD-10-CM

## 2022-06-12 DIAGNOSIS — E782 Mixed hyperlipidemia: Secondary | ICD-10-CM

## 2022-06-12 MED ORDER — REPATHA SURECLICK 140 MG/ML ~~LOC~~ SOAJ: 140.0000 mg | SUBCUTANEOUS | 5 refills | Status: DC

## 2022-06-12 MED ORDER — METOPROLOL TARTRATE 50 MG PO TABS: 50.0000 mg | ORAL_TABLET | Freq: Two times a day (BID) | ORAL | 3 refills | Status: DC

## 2022-06-12 NOTE — Progress Notes (Signed)
Follow up visit  Subjective:   [name], female    DOB: [birth year], 56 y.o.   MRN: [id number]     HPI  Chief Complaint  Patient presents with   Coronary Artery Disease   Follow-up    PCI    56 y/o African-American female with hypertension, type 2 diabetes mellitus, CAD, postmenopausal vaginal bleeding, anemia   Patient has had complete resolution of her anginal symptoms since her most recent PCI.  She also has had improvement in her vaginal bleeding at this time.  On a separate note, patient is looking at undergoing elective colonoscopy in the near future.  Current Outpatient Medications:    acetaminophen (TYLENOL) 325 MG tablet, Take 650 mg by mouth every 6 (six) hours as needed (pain.)., Disp: , Rfl:    amLODipine (NORVASC) 5 MG tablet, Take 1 tablet (5 mg total) by mouth daily., Disp: 30 tablet, Rfl: 1   aspirin 81 MG chewable tablet, Chew 81 mg by mouth in the morning., Disp: , Rfl:    atorvastatin (LIPITOR) 40 MG tablet, Take 1 tablet (40 mg total) by mouth every evening., Disp: 90 tablet, Rfl: 3   Ferrous Sulfate (IRON) 325 (65 FE) MG TABS, Take 325 mg by mouth 2 (two) times daily., Disp: , Rfl:    furosemide (LASIX) 40 MG tablet, Take 1 tablet (40 mg total) by mouth in the morning., Disp: 30 tablet, Rfl: 6   glipiZIDE (GLUCOTROL XL) 5 MG 24 hr tablet, Take 5 mg by mouth daily with breakfast., Disp: , Rfl:    isosorbide-hydrALAZINE (BIDIL) 20-37.5 MG tablet, Take 1 tablet by mouth 3 (three) times daily., Disp: 90 tablet, Rfl: 1   metFORMIN (GLUCOPHAGE) 1000 MG tablet, Take 1,000 mg by mouth 2 (two) times daily with a meal., Disp: , Rfl:    metoprolol tartrate (LOPRESSOR) 25 MG tablet, Take 1 tablet (25 mg total) by mouth 2 (two) times daily., Disp: 180 tablet, Rfl: 3   Semaglutide (OZEMPIC, 0.25 OR 0.5 MG/DOSE, [place]), Inject 0.5 mg into the skin every Sunday., Disp: , Rfl:    ticagrelor (BRILINTA) 90 MG TABS tablet, Take 1 tablet (90 mg total) by mouth 2 (two) times  daily., Disp: 60 tablet, Rfl: 6   Cardiovascular & other pertient studies:  Reviewed external labs and tests, independently interpreted  EKG 06/12/2022: Sinus tachycardia 106 bpm  Nonspecific T-abnormality  Coronary intervention 05/26/2022: OM1: 90% complex lesion Intravascular ultrasound Successful PTCA OM1 with 2.0X15 Scoreflex and 2.0X6 mm [place] cutting balloons    Coronary intervention 03/10/2022:  LV: 168/80, EDP 19 mmHg.  Ao 196/90, mean 134 mmHg.  No pressure gradient across the aortic valve. LM: Moderate caliber vessel.  No significant disease. LAD: Moderate caliber vessel throughout.  Mild diffuse disease.  Gives origin to several small diagonals, diagonal 2 and 3 which arises as bifurcation in the midsegment of the LAD have mild ostial disease.  Mid to distal segment of the LAD has a focal 40% stenosis. CX: Again a moderate caliber vessel giving origin to a very large territory distribution OM1, a 2.0 x 2.25 mm vessel with a high-grade ulcerated proximal tandem 95 to 99% stenosis.  OM 2 is very small caliber and again, moderate area of distribution, ostial 50% stenosis.  Distal circumflex has a 40% stenosis. RCA: The mid segment of the RCA has a stent from 2013 and a 2.75 x 23 mm Xience stent from 10/28/2019.  These are overlapping.  The candy wrapper stenosis both at the  inflow and outflow of the old stent from 2013 (proximal ISR, distal new lesion).  There is a 60 to 70% proximal stenosis starting from the proximal segment of the previously placed stent from 2021.   Interventional data: Difficult procedure due to poor visualization and resilient in-stent restenosis.  2.75 x 15 mm score flex balloon angioplasty at 20 atmospheric pressure followed by stenting with a 2.75 x 32 mm Synergy XT DES covering the entire in-stent restenosis to outflow stenosis of previously placed stent from 2013. Successful stenting of the proximal to mid RCA overlapping stent that was placed on 10/28/2019 and  the distal edge with a 2.74 x 20 mm Synergy XD deployed at 16 atmospheric pressure.  The entire stented segment was postdilated to 20 atm pressure giving a 3 mm lumen.  Recommendation: Patient extremely high risk for recurrent restenosis and progression of coronary disease in view of uncontrolled hypertension, diabetes mellitus and noncompliance with medical advice.  Reiterate risks to the patient.  Aspirin indefinitely and Brilinta for at least 1 year in view of NSTEMI.  Patient will be scheduled for elective stage intervention to a very large OM1 tomorrow if she remains stable through the right.  Although OM1 is 2.0-2.25 mm vessel, has a very large area of distribution and a high-grade ulcerated 99% stenosis.  She is at risk for developing recurrent ACS if untreated.  175 mL contrast utilized  Recent labs: 04/02/2022: Glucose 242, BUN/Cr 17/0.96. EGFR >60. Na/K 139/4.1.  H/H 8.2/25. MCV 76. Platelets 315 HbA1C 8.7% Lipid panel NA Lipoprotein a 196  Latest Reference Range & Units 04/02/22 15:48 04/02/22 16:08  Troponin I (High Sensitivity) <18 ng/L 11 10     Review of Systems  Cardiovascular:  Negative for chest pain, dyspnea on exertion, leg swelling, palpitations and syncope.       Vitals:   06/12/22 1143  BP: 137/81  Pulse: 100  Resp: 16  Temp: 98 F (36.7 C)  SpO2: 100%    Body mass index is 46.46 kg/m. Filed Weights   06/12/22 1143  Weight: 254 lb (115.2 kg)     Objective:   Physical Exam Vitals and nursing note reviewed.  Constitutional:      General: She is not in acute distress. Neck:     Vascular: No JVD.  Cardiovascular:     Rate and Rhythm: Normal rate and regular rhythm.     Heart sounds: Normal heart sounds. No murmur heard. Pulmonary:     Effort: Pulmonary effort is normal.     Breath sounds: Normal breath sounds. No wheezing or rales.  Musculoskeletal:     Right lower leg: No edema.     Left lower leg: No edema.             Visit  diagnoses:   ICD-10-CM   1. Coronary artery disease of native artery of native heart with stable angina pectoris ([place])  I25.118 EKG 12-Lead       Orders Placed This Encounter  Procedures   EKG 12-Lead   No orders of the defined types were placed in this encounter.    Assessment & Recommendations:   56 y/o African-American female with hypertension, type 2 diabetes mellitus, CAD, postmenopausal vaginal bleeding, anemia  CAD: S/p multivessel PCI with resolution of angina symptoms. I did look, recommend dual antiplatelet therapy with aspirin and Brilinta at least till 02/2023. However, should patient need to undergo [facility] procedure for her vaginal bleeding before that, this can be performed anytime after  mid September 2023.  If any elective procedures, delayed at least until 11/2022, ideally till 02/2023. Increase metoprolol tartrate to 50 mg twice daily. Okay to can continue Imdur 60 mg daily for now.  In future, this could be discontinued, when blood pressure is better controlled. She is not taking statin at this time.  LDL is 97, lipoprotein a is 196.  Given that statins can increase lipoprotein a, and as such LDL is <100, I recommend Repatha for LDL as well as relative lipoprotein a reduction.  Mixed hyperlipidemia: As above  Hypertension: Did not tolerate BiDil, therefore stopped.  Increase metoprolol tartrate to 50 mg twice daily today.  F/u in 3 months   [name], MD Pager: [phone] Office: [phone]

## 2022-06-25 ENCOUNTER — Other Ambulatory Visit: Payer: Self-pay

## 2022-06-29 ENCOUNTER — Other Ambulatory Visit: Payer: Self-pay

## 2022-07-14 ENCOUNTER — Encounter (HOSPITAL_COMMUNITY): Payer: Self-pay

## 2022-07-14 ENCOUNTER — Encounter (HOSPITAL_COMMUNITY)
Admission: RE | Admit: 2022-07-14 | Discharge: 2022-07-14 | Disposition: A | Discharge status: Home / Self Care | Payer: Medicaid Other | Source: Ambulatory Visit | Attending: Cardiology | Admitting: Cardiology

## 2022-07-14 VITALS — BP 122/76 | HR 94 | Ht 61.0 in | Wt 254.4 lb

## 2022-07-14 DIAGNOSIS — M25512 Pain in left shoulder: Secondary | ICD-10-CM | POA: Insufficient documentation

## 2022-07-14 DIAGNOSIS — M549 Dorsalgia, unspecified: Secondary | ICD-10-CM | POA: Insufficient documentation

## 2022-07-14 DIAGNOSIS — Z48812 Encounter for surgical aftercare following surgery on the circulatory system: Secondary | ICD-10-CM | POA: Diagnosis not present

## 2022-07-14 DIAGNOSIS — R9431 Abnormal electrocardiogram [ECG] [EKG]: Secondary | ICD-10-CM | POA: Insufficient documentation

## 2022-07-14 DIAGNOSIS — Z9861 Coronary angioplasty status: Secondary | ICD-10-CM | POA: Insufficient documentation

## 2022-07-14 DIAGNOSIS — R0602 Shortness of breath: Secondary | ICD-10-CM | POA: Diagnosis not present

## 2022-07-14 HISTORY — DX: Hyperlipidemia, unspecified: E78.5

## 2022-07-14 LAB — GLUCOSE, CAPILLARY: Glucose-Capillary: 241 mg/dL — ABNORMAL HIGH (ref 70–99)

## 2022-07-14 NOTE — Progress Notes (Addendum)
Cardiac Rehab Medication Review by a Nurse  Does the patient  feel that his/her medications are working for him/her?  yes  Has the patient been experiencing any side effects to the medications prescribed?  no  Does the patient measure his/her own blood pressure or blood glucose at home?  no   Does the patient have any problems obtaining medications due to transportation or finances?   yes  Understanding of regimen: fair Understanding of indications: fair Potential of compliance: fair    Nurse comments: [name] says that she has not been able to take [place] as her insurance has not authorized the medication. I called Dr Bonney Roussel office. The office staff says they are working on getting authorization. I left a message with [name]'s primary care provider [facility] as [name] she does not have money to buy strips and lancets. Patient also given information on the MAP program. [name] says that her primary care provider stopped the glipizide as she is now taking ozempic.    Courtney See [name] RN 07/14/2022 2:55 PM

## 2022-07-14 NOTE — Progress Notes (Signed)
EKG 07/14/2022: Sinus rhythm Nonspecific ST-T abnormality

## 2022-07-14 NOTE — Progress Notes (Addendum)
Patient here for cardiac rehab orientation this afternoon. Reports having left shoulder pain that radiated to her chest and back on Sunday while taking a bed apart with her daughter. Glorianne says the discomfort was a 9/10. Khrystyne says that she took 3 baby aspirin at the time. Jakirah said the pain lasted about 30 minutes and felt similar to the pain she had before she had her angioplasty in August. Mita denies having any pain currently. Blood pressure 122/76. Oxygen saturation 100% on room air. Heart rate 82. Dr Virgina Jock was notified. 12 lead ECG ordered and obtained. Dr Virgina Jock said that Ms Spitler may proceed with 6 minute walk test today after review of 12 lead ECG. Appointment made to follow up in the office tomorrow at 3:00 pm. Harrell Gave RN BSN

## 2022-07-14 NOTE — Progress Notes (Signed)
Delynn did report having mid posterior back pain discomfort after the 6 minute walk test was completed.  Rated a 5/10 Denied chest pain this resolved with rest. Dr Virgina Jock was notified. Dr Virgina Jock said he will address with the patient tomorrow at her follow up appointment tomorrow afternoon.Harrell Gave RN BSN

## 2022-07-14 NOTE — Progress Notes (Signed)
Cardiac Individual Treatment Plan  Patient Details  Name: [name] MRN: [id number] Date of Birth: 10-Jun-1966 Referring Provider:   Flowsheet Row CARDIAC REHAB PHASE II ORIENTATION from 07/14/2022 in [facility]  Referring Provider Patwardhan, Reynold Bowen, MD       Initial Encounter Date:  [facility] from 07/14/2022 in [facility]  Date 07/14/22       Visit Diagnosis: 05/26/22 PTCA OM 1 - Plan: EKG 12-Lead, EKG 12-Lead  Patient's Home Medications on Admission:  Current Outpatient Medications:    acetaminophen (TYLENOL) 325 MG tablet, Take 650 mg by mouth every 6 (six) hours as needed (pain.)., Disp: , Rfl:    amLODipine (NORVASC) 5 MG tablet, Take 1 tablet (5 mg total) by mouth daily., Disp: 30 tablet, Rfl: 1   aspirin 81 MG chewable tablet, Chew 81 mg by mouth in the morning., Disp: , Rfl:    atorvastatin (LIPITOR) 40 MG tablet, Take 1 tablet (40 mg total) by mouth every evening., Disp: 90 tablet, Rfl: 3   Ferrous Sulfate (IRON) 325 (65 FE) MG TABS, Take 325 mg by mouth 2 (two) times daily., Disp: , Rfl:    furosemide (LASIX) 40 MG tablet, Take 1 tablet (40 mg total) by mouth in the morning., Disp: 30 tablet, Rfl: 6   metFORMIN (GLUCOPHAGE) 1000 MG tablet, Take 1,000 mg by mouth 2 (two) times daily with a meal., Disp: , Rfl:    metoprolol tartrate (LOPRESSOR) 50 MG tablet, Take 1 tablet (50 mg total) by mouth 2 (two) times daily., Disp: 180 tablet, Rfl: 3   Semaglutide (OZEMPIC, 0.25 OR 0.5 MG/DOSE, [place]), Inject 0.5 mg into the skin every Sunday., Disp: , Rfl:    ticagrelor (BRILINTA) 90 MG TABS tablet, Take 1 tablet (90 mg total) by mouth 2 (two) times daily., Disp: 60 tablet, Rfl: 6   Evolocumab (REPATHA SURECLICK) 856 MG/ML SOAJ, Inject 140 mg into the skin every 14 (fourteen) days. (Patient not taking: Reported on 07/14/2022), Disp: 6 mL, Rfl: 5   glipiZIDE (GLUCOTROL XL) 5 MG 24  hr tablet, Take 5 mg by mouth daily with breakfast. (Patient not taking: Reported on 07/14/2022), Disp: , Rfl:   Past Medical History: Past Medical History:  Diagnosis Date   Anemia    CAD (coronary artery disease)    PCI with stent to RCA in 2019   CHF (congestive heart failure) ([place]) 04/2011   EF 15-20% from echo 05/19/11   DOE (dyspnea on exertion)    DVT (deep venous thrombosis) ([place]) 06/2011   LLE   Fatigue    HTN (hypertension)    Hyperlipidemia    Menorrhagia    with iron deficient anemia   NSTEMI (non-ST elevated myocardial infarction) ([place]) 10/26/2017   NSTEMI (non-ST elevated myocardial infarction) ([place]) 10/27/2017   Obesity    Orthopnea    Type II diabetes mellitus ([place])     Tobacco Use: Social History   Tobacco Use  Smoking Status Former   Packs/day: 0.50   Years: 5.00   Total pack years: 2.50   Types: Cigarettes   Quit date: 10/19/1993   Years since quitting: 28.7  Smokeless Tobacco Never    Labs: Review Flowsheet  More data exists      Latest Ref Rng & Units 10/28/2017 11/16/2019 11/17/2019 03/10/2022 05/27/2022  Labs for ITP Cardiac and Pulmonary Rehab  Cholestrol 0 - 200 mg/dL 118  - 106  - 143  LDL (calc) 0 - 99 mg/dL 65  - 62  - 94   HDL-C >40 mg/dL 31  - 34  - 35   Trlycerides <150 mg/dL 111  - 51  - 69   Hemoglobin A1c 4.8 - 5.6 % - 8.6  - 8.7  -    Capillary Blood Glucose: Lab Results  Component Value Date   GLUCAP 241 (H) 07/14/2022   GLUCAP 293 (H) 05/27/2022   GLUCAP 183 (H) 05/26/2022   GLUCAP 287 (H) 05/26/2022   GLUCAP 247 (H) 05/26/2022     Exercise Target Goals: Exercise Program Goal: Individual exercise prescription set using results from initial 6 min walk test and THRR while considering  patient's activity barriers and safety.   Exercise Prescription Goal: Initial exercise prescription builds to 30-45 minutes a day of aerobic activity, 2-3 days per week.  Home exercise guidelines will be given to patient during program as part  of exercise prescription that the participant will acknowledge.  Activity Barriers & Risk Stratification:  Activity Barriers & Cardiac Risk Stratification - 07/14/22 1430       Activity Barriers & Cardiac Risk Stratification   Activity Barriers Shortness of Breath;Other (comment)    Comments Left shoulder pain, limited ROM. SOB going up hills/inclines.    Cardiac Risk Stratification High             6 Minute Walk:  6 Minute Walk     Row Name 07/14/22 1518         6 Minute Walk   Phase Initial     Distance 1058 feet     Walk Time 6 minutes     # of Rest Breaks 0     MPH 2     METS 2.56     RPE 7     Perceived Dyspnea  0     VO2 Peak 8.95     Symptoms Yes (comment)     Comments Patient c/o right sided chest and mid back discomfort, "5/10" on the pain scale, resolved with rest.     Resting HR 94 bpm     Resting BP 122/76     Resting Oxygen Saturation  100 %     Exercise Oxygen Saturation  during 6 min walk 100 %     Max Ex. HR 117 bpm     Max Ex. BP 148/82     2 Minute Post BP 128/78              Oxygen Initial Assessment:   Oxygen Re-Evaluation:   Oxygen Discharge (Final Oxygen Re-Evaluation):   Initial Exercise Prescription:  Initial Exercise Prescription - 07/14/22 1500       Date of Initial Exercise RX and Referring Provider   Date 07/14/22    Referring Provider [name], MD    Expected Discharge Date 08/26/22      Treadmill   MPH 2    Grade 0    Minutes 15    METs 2.53      NuStep   Level 2    SPM 85    Minutes 15    METs 2.4      Prescription Details   Frequency (times per week) 3    Duration Progress to 30 minutes of continuous aerobic without signs/symptoms of physical distress      Intensity   THRR 40-80% of Max Heartrate 66-133    Ratings of Perceived Exertion 11-13    Perceived Dyspnea 0-4  Progression   Progression Continue to progress workloads to maintain intensity without signs/symptoms of physical  distress.      Resistance Training   Training Prescription Yes    Weight 3 lbs    Reps 10-15             Perform Capillary Blood Glucose checks as needed.  Exercise Prescription Changes:   Exercise Comments:   Exercise Goals and Review:   Exercise Goals     Row Name 07/14/22 1430             Exercise Goals   Increase Physical Activity Yes       Intervention Provide advice, education, support and counseling about physical activity/exercise needs.;Develop an individualized exercise prescription for aerobic and resistive training based on initial evaluation findings, risk stratification, comorbidities and participant's personal goals.       Expected Outcomes Short Term: Attend rehab on a regular basis to increase amount of physical activity.;Long Term: Exercising regularly at least 3-5 days a week.;Long Term: Add in home exercise to make exercise part of routine and to increase amount of physical activity.       Increase Strength and Stamina Yes       Intervention Provide advice, education, support and counseling about physical activity/exercise needs.;Develop an individualized exercise prescription for aerobic and resistive training based on initial evaluation findings, risk stratification, comorbidities and participant's personal goals.       Expected Outcomes Short Term: Increase workloads from initial exercise prescription for resistance, speed, and METs.;Short Term: Perform resistance training exercises routinely during rehab and add in resistance training at home;Long Term: Improve cardiorespiratory fitness, muscular endurance and strength as measured by increased METs and functional capacity (6MWT)       Able to understand and use rate of perceived exertion (RPE) scale Yes       Intervention Provide education and explanation on how to use RPE scale       Expected Outcomes Short Term: Able to use RPE daily in rehab to express subjective intensity level;Long Term:  Able to  use RPE to guide intensity level when exercising independently       Knowledge and understanding of Target Heart Rate Range (THRR) Yes       Intervention Provide education and explanation of THRR including how the numbers were predicted and where they are located for reference       Expected Outcomes Short Term: Able to state/look up THRR;Long Term: Able to use THRR to govern intensity when exercising independently;Short Term: Able to use daily as guideline for intensity in rehab       Able to check pulse independently Yes       Intervention Provide education and demonstration on how to check pulse in carotid and radial arteries.;Review the importance of being able to check your own pulse for safety during independent exercise       Expected Outcomes Short Term: Able to explain why pulse checking is important during independent exercise;Long Term: Able to check pulse independently and accurately       Understanding of Exercise Prescription Yes       Intervention Provide education, explanation, and written materials on patient's individual exercise prescription       Expected Outcomes Short Term: Able to explain program exercise prescription;Long Term: Able to explain home exercise prescription to exercise independently                Exercise Goals Re-Evaluation :   Discharge Exercise Prescription (  Final Exercise Prescription Changes):   Nutrition:  Target Goals: Understanding of nutrition guidelines, daily intake of sodium '1500mg'$ , cholesterol '200mg'$ , calories 30% from fat and 7% or less from saturated fats, daily to have 5 or more servings of fruits and vegetables.  Biometrics:  Pre Biometrics - 07/14/22 1335       Pre Biometrics   Waist Circumference 54 inches    Hip Circumference 57 inches    Waist to Hip Ratio 0.95 %    Triceps Skinfold 60 mm    % Body Fat 61.1 %    Grip Strength 32 kg    Flexibility 12.13 in    Single Leg Stand 30 seconds              Nutrition  Therapy Plan and Nutrition Goals:   Nutrition Assessments:  MEDIFICTS Score Key: ?70 Need to make dietary changes  40-70 Heart Healthy Diet ? 40 Therapeutic Level Cholesterol Diet    Picture Your Plate Scores: <50 Unhealthy dietary pattern with much room for improvement. 41-50 Dietary pattern unlikely to meet recommendations for good health and room for improvement. 51-60 More healthful dietary pattern, with some room for improvement.  >60 Healthy dietary pattern, although there may be some specific behaviors that could be improved.    Nutrition Goals Re-Evaluation:   Nutrition Goals Re-Evaluation:   Nutrition Goals Discharge (Final Nutrition Goals Re-Evaluation):   Psychosocial: Target Goals: Acknowledge presence or absence of significant depression and/or stress, maximize coping skills, provide positive support system. Participant is able to verbalize types and ability to use techniques and skills needed for reducing stress and depression.  Initial Review & Psychosocial Screening:  Initial Psych Review & Screening - 07/14/22 1612       Initial Review   Current issues with Current Stress Concerns    Source of Stress Concerns Financial;Chronic Illness    Comments [name] has financial stressors as she is not working currently.      Family Dynamics   Good Support System? Yes   [name] has her 3 daughter and her dog for support     Barriers   Psychosocial barriers to participate in program The patient should benefit from training in stress management and relaxation.      Screening Interventions   Interventions Encouraged to exercise    Expected Outcomes Long Term Goal: Stressors or current issues are controlled or eliminated.             Quality of Life Scores:  Quality of Life - 07/14/22 1557       Quality of Life   Select Quality of Life      Quality of Life Scores   Health/Function Pre 26.63 %    Socioeconomic Pre 22.13 %    Psych/Spiritual Pre 28.07  %    Family Pre 30 %    GLOBAL Pre 26.37 %            Scores of 19 and below usually indicate a poorer quality of life in these areas.  A difference of  2-3 points is a clinically meaningful difference.  A difference of 2-3 points in the total score of the Quality of Life Index has been associated with significant improvement in overall quality of life, self-image, physical symptoms, and general health in studies assessing change in quality of life.  PHQ-9: Review Flowsheet       07/14/2022 04/02/2022 12/22/2017  Depression screen PHQ 2/9  Decreased Interest 0 3 0  Down, Depressed, Hopeless 0  0 0  PHQ - 2 Score 0 3 0  Altered sleeping - 3 -  Tired, decreased energy - 3 -  Change in appetite - 1 -  Feeling bad or failure about yourself  - 0 -  Trouble concentrating - 0 -  Moving slowly or fidgety/restless - 3 -  Suicidal thoughts - 0 -  PHQ-9 Score - 13 -  Difficult doing work/chores - Very difficult -   Interpretation of Total Score  Total Score Depression Severity:  1-4 = Minimal depression, 5-9 = Mild depression, 10-14 = Moderate depression, 15-19 = Moderately severe depression, 20-27 = Severe depression   Psychosocial Evaluation and Intervention:   Psychosocial Re-Evaluation:   Psychosocial Discharge (Final Psychosocial Re-Evaluation):   Vocational Rehabilitation: Provide vocational rehab assistance to qualifying candidates.   Vocational Rehab Evaluation & Intervention:  Vocational Rehab - 07/14/22 1616       Initial Vocational Rehab Evaluation & Intervention   Assessment shows need for Vocational Rehabilitation Yes   [name] is currently unemployed. Given vocational rehab packet to review and return next week   [place] given to patient 07/14/22             Education: Education Goals: Education classes will be provided on a weekly basis, covering required topics. Participant will state understanding/return demonstration of topics  presented.     Core Videos: Exercise    Move It!  Clinical staff conducted group or individual video education with verbal and written material and guidebook.  Patient learns the recommended Pritikin exercise program. Exercise with the goal of living a long, healthy life. Some of the health benefits of exercise include controlled diabetes, healthier blood pressure levels, improved cholesterol levels, improved heart and lung capacity, improved sleep, and better body composition. Everyone should speak with their doctor before starting or changing an exercise routine.  Biomechanical Limitations Clinical staff conducted group or individual video education with verbal and written material and guidebook.  Patient learns how biomechanical limitations can impact exercise and how we can mitigate and possibly overcome limitations to have an impactful and balanced exercise routine.  Body Composition Clinical staff conducted group or individual video education with verbal and written material and guidebook.  Patient learns that body composition (ratio of muscle mass to fat mass) is a key component to assessing overall fitness, rather than body weight alone. Increased fat mass, especially visceral belly fat, can put [place] at increased risk for metabolic syndrome, type 2 diabetes, heart disease, and even death. It is recommended to combine diet and exercise (cardiovascular and resistance training) to improve your body composition. Seek guidance from your physician and exercise physiologist before implementing an exercise routine.  Exercise Action Plan Clinical staff conducted group or individual video education with verbal and written material and guidebook.  Patient learns the recommended strategies to achieve and enjoy long-term exercise adherence, including variety, self-motivation, self-efficacy, and positive decision making. Benefits of exercise include fitness, good health, weight management, more energy,  better sleep, less stress, and overall well-being.  Medical   Heart Disease Risk Reduction Clinical staff conducted group or individual video education with verbal and written material and guidebook.  Patient learns our heart is our most vital organ as it circulates oxygen, nutrients, white blood cells, and hormones throughout the entire body, and carries waste away. Data supports a plant-based eating plan like the Pritikin Program for its effectiveness in slowing progression of and reversing heart disease. The video provides a number of recommendations to address  heart disease.   Metabolic Syndrome and Belly Fat  Clinical staff conducted group or individual video education with verbal and written material and guidebook.  Patient learns what metabolic syndrome is, how it leads to heart disease, and how one can reverse it and keep it from coming back. You have metabolic syndrome if you have 3 of the following 5 criteria: abdominal obesity, high blood pressure, high triglycerides, low HDL cholesterol, and high blood sugar.  Hypertension and Heart Disease Clinical staff conducted group or individual video education with verbal and written material and guidebook.  Patient learns that high blood pressure, or hypertension, is very common in the [place]. Hypertension is largely due to excessive salt intake, but other important risk factors include being overweight, physical inactivity, drinking too much alcohol, smoking, and not eating enough potassium from fruits and vegetables. High blood pressure is a leading risk factor for heart attack, stroke, congestive heart failure, dementia, kidney failure, and premature death. Long-term effects of excessive salt intake include stiffening of the arteries and thickening of heart muscle and organ damage. Recommendations include ways to reduce hypertension and the risk of heart disease.  Diseases of Our Time - Focusing on Diabetes Clinical staff conducted group  or individual video education with verbal and written material and guidebook.  Patient learns why the best way to stop diseases of our time is prevention, through food and other lifestyle changes. Medicine (such as prescription pills and surgeries) is often only a Band-Aid on the problem, not a long-term solution. Most common diseases of our time include obesity, type 2 diabetes, hypertension, heart disease, and cancer. The Pritikin Program is recommended and has been proven to help reduce, reverse, and/or prevent the damaging effects of metabolic syndrome.  Nutrition   Overview of the Pritikin Eating Plan  Clinical staff conducted group or individual video education with verbal and written material and guidebook.  Patient learns about the [place] for disease risk reduction. The [place] emphasizes a wide variety of unrefined, minimally-processed carbohydrates, like fruits, vegetables, whole grains, and legumes. Go, Caution, and Stop food choices are explained. Plant-based and lean animal proteins are emphasized. Rationale provided for low sodium intake for blood pressure control, low added sugars for blood sugar stabilization, and low added fats and oils for coronary artery disease risk reduction and weight management.  Calorie Density  Clinical staff conducted group or individual video education with verbal and written material and guidebook.  Patient learns about calorie density and how it impacts the Pritikin Eating Plan. Knowing the characteristics of the food you choose will help you decide whether those foods will lead to weight gain or weight loss, and whether you want to consume more or less of them. Weight loss is usually a side effect of the Pritikin Eating Plan because of its focus on low calorie-dense foods.  Label Reading  Clinical staff conducted group or individual video education with verbal and written material and guidebook.  Patient learns about the Pritikin  recommended label reading guidelines and corresponding recommendations regarding calorie density, added sugars, sodium content, and whole grains.  Dining Out - Part 1  Clinical staff conducted group or individual video education with verbal and written material and guidebook.  Patient learns that restaurant meals can be sabotaging because they can be so high in calories, fat, sodium, and/or sugar. Patient learns recommended strategies on how to positively address this and avoid unhealthy pitfalls.  Facts on Fats  Clinical staff conducted group or  individual video education with verbal and written material and guidebook.  Patient learns that lifestyle modifications can be just as effective, if not more so, as many medications for lowering your risk of heart disease. A Pritikin lifestyle can help to reduce your risk of inflammation and atherosclerosis (cholesterol build-up, or plaque, in the artery walls). Lifestyle interventions such as dietary choices and physical activity address the cause of atherosclerosis. A review of the types of fats and their impact on blood cholesterol levels, along with dietary recommendations to reduce fat intake is also included.  Nutrition Action Plan  Clinical staff conducted group or individual video education with verbal and written material and guidebook.  Patient learns how to incorporate Pritikin recommendations into their lifestyle. Recommendations include planning and keeping personal health goals in mind as an important part of their success.  Healthy Mind-Set    Healthy Minds, Bodies, Hearts  Clinical staff conducted group or individual video education with verbal and written material and guidebook.  Patient learns how to identify when they are stressed. Video will discuss the impact of that stress, as well as the many benefits of stress management. Patient will also be introduced to stress management techniques. The way we think, act, and feel has an impact on  our hearts.  How Our Thoughts Can Heal Our Hearts  Clinical staff conducted group or individual video education with verbal and written material and guidebook.  Patient learns that negative thoughts can cause depression and anxiety. This can result in negative lifestyle behavior and serious health problems. Cognitive behavioral therapy is an effective method to help control our thoughts in order to change and improve our emotional outlook.  Additional Videos:  Exercise    Improving Performance  Clinical staff conducted group or individual video education with verbal and written material and guidebook.  Patient learns to use a non-linear approach by alternating intensity levels and lengths of time spent exercising to help burn more calories and lose more body fat. Cardiovascular exercise helps improve heart health, metabolism, hormonal balance, blood sugar control, and recovery from fatigue. Resistance training improves strength, endurance, balance, coordination, reaction time, metabolism, and muscle mass. Flexibility exercise improves circulation, posture, and balance. Seek guidance from your physician and exercise physiologist before implementing an exercise routine and learn your capabilities and proper form for all exercise.  Introduction to Yoga  Clinical staff conducted group or individual video education with verbal and written material and guidebook.  Patient learns about yoga, a discipline of the coming together of mind, breath, and body. The benefits of yoga include improved flexibility, improved range of motion, better posture and core strength, increased lung function, weight loss, and positive self-image. Yoga's heart health benefits include lowered blood pressure, healthier heart rate, decreased cholesterol and triglyceride levels, improved immune function, and reduced stress. Seek guidance from your physician and exercise physiologist before implementing an exercise routine and learn  your capabilities and proper form for all exercise.  Medical   Aging: Enhancing Your Quality of Life  Clinical staff conducted group or individual video education with verbal and written material and guidebook.  Patient learns key strategies and recommendations to stay in good physical health and enhance quality of life, such as prevention strategies, having an advocate, securing a [place], and keeping a list of medications and system for tracking them. It also discusses how to avoid risk for bone loss.  Biology of Weight Control  Clinical staff conducted group or individual video education  with verbal and written material and guidebook.  Patient learns that weight gain occurs because we consume more calories than we burn (eating more, moving less). Even if your body weight is normal, you may have higher ratios of fat compared to muscle mass. Too much body fat puts you at increased risk for cardiovascular disease, heart attack, stroke, type 2 diabetes, and obesity-related cancers. In addition to exercise, following the [place] can help reduce your risk.  Decoding Lab Results  Clinical staff conducted group or individual video education with verbal and written material and guidebook.  Patient learns that lab test reflects one measurement whose values change over time and are influenced by many factors, including medication, stress, sleep, exercise, food, hydration, pre-existing medical conditions, and more. It is recommended to use the knowledge from this video to become more involved with your lab results and evaluate your numbers to speak with your doctor.   Diseases of Our Time - Overview  Clinical staff conducted group or individual video education with verbal and written material and guidebook.  Patient learns that according to the CDC, 50% to 70% of chronic diseases (such as obesity, type 2 diabetes, elevated lipids, hypertension, and heart disease)  are avoidable through lifestyle improvements including healthier food choices, listening to satiety cues, and increased physical activity.  Sleep Disorders Clinical staff conducted group or individual video education with verbal and written material and guidebook.  Patient learns how good quality and duration of sleep are important to overall health and well-being. Patient also learns about sleep disorders and how they impact health along with recommendations to address them, including discussing with a physician.  Nutrition  Dining Out - Part 2 Clinical staff conducted group or individual video education with verbal and written material and guidebook.  Patient learns how to plan ahead and communicate in order to maximize their dining experience in a healthy and nutritious manner. Included are recommended food choices based on the type of restaurant the patient is visiting.   Fueling a Best boy conducted group or individual video education with verbal and written material and guidebook.  There is a strong connection between our food choices and our health. Diseases like obesity and type 2 diabetes are very prevalent and are in large-part due to lifestyle choices. The Pritikin Eating Plan provides plenty of food and hunger-curbing satisfaction. It is easy to follow, affordable, and helps reduce health risks.  Menu Workshop  Clinical staff conducted group or individual video education with verbal and written material and guidebook.  Patient learns that restaurant meals can sabotage health goals because they are often packed with calories, fat, sodium, and sugar. Recommendations include strategies to plan ahead and to communicate with the manager, chef, or server to help order a healthier meal.  Planning Your Eating Strategy  Clinical staff conducted group or individual video education with verbal and written material and guidebook.  Patient learns about the [place]  and its benefit of reducing the risk of disease. The [place] does not focus on calories. Instead, it emphasizes high-quality, nutrient-rich foods. By knowing the characteristics of the foods, we choose, we can determine their calorie density and make informed decisions.  Targeting Your Nutrition Priorities  Clinical staff conducted group or individual video education with verbal and written material and guidebook.  Patient learns that lifestyle habits have a tremendous impact on disease risk and progression. This video provides eating and physical activity recommendations based on your personal  health goals, such as reducing LDL cholesterol, losing weight, preventing or controlling type 2 diabetes, and reducing high blood pressure.  Vitamins and Minerals  Clinical staff conducted group or individual video education with verbal and written material and guidebook.  Patient learns different ways to obtain key vitamins and minerals, including through a recommended healthy diet. It is important to discuss all supplements you take with your doctor.   Healthy Mind-Set    Smoking Cessation  Clinical staff conducted group or individual video education with verbal and written material and guidebook.  Patient learns that cigarette smoking and tobacco addiction pose a serious health risk which affects millions of people. Stopping smoking will significantly reduce the risk of heart disease, lung disease, and many forms of cancer. Recommended strategies for quitting are covered, including working with your doctor to develop a successful plan.  Culinary   Becoming a Financial trader conducted group or individual video education with verbal and written material and guidebook.  Patient learns that cooking at home can be healthy, cost-effective, quick, and puts them in control. Keys to cooking healthy recipes will include looking at your recipe, assessing your equipment needs, planning  ahead, making it simple, choosing cost-effective seasonal ingredients, and limiting the use of added fats, salts, and sugars.  Cooking - Breakfast and Snacks  Clinical staff conducted group or individual video education with verbal and written material and guidebook.  Patient learns how important breakfast is to satiety and nutrition through the entire day. Recommendations include key foods to eat during breakfast to help stabilize blood sugar levels and to prevent overeating at meals later in the day. Planning ahead is also a key component.  Cooking - Human resources officer conducted group or individual video education with verbal and written material and guidebook.  Patient learns eating strategies to improve overall health, including an approach to cook more at home. Recommendations include thinking of animal protein as a side on your plate rather than center stage and focusing instead on lower calorie dense options like vegetables, fruits, whole grains, and plant-based proteins, such as beans. Making sauces in large quantities to freeze for later and leaving the skin on your vegetables are also recommended to maximize your experience.  Cooking - Healthy Salads and Dressing Clinical staff conducted group or individual video education with verbal and written material and guidebook.  Patient learns that vegetables, fruits, whole grains, and legumes are the foundations of the [place]. Recommendations include how to incorporate each of these in flavorful and healthy salads, and how to create homemade salad dressings. Proper handling of ingredients is also covered. Cooking - Soups and [employer] - Soups and Desserts Clinical staff conducted group or individual video education with verbal and written material and guidebook.  Patient learns that Pritikin soups and desserts make for easy, nutritious, and delicious snacks and meal components that are low in sodium, fat, sugar,  and calorie density, while high in vitamins, minerals, and filling fiber. Recommendations include simple and healthy ideas for soups and desserts.   Overview     The Pritikin Solution Program Overview Clinical staff conducted group or individual video education with verbal and written material and guidebook.  Patient learns that the results of the [place] Program have been documented in more than 100 articles published in peer-reviewed journals, and the benefits include reducing risk factors for (and, in some cases, even reversing) high cholesterol, high blood pressure, type 2 diabetes, obesity, and  more! An overview of the three key pillars of the Pritikin Program will be covered: eating well, doing regular exercise, and having a healthy mind-set.  WORKSHOPS  Exercise: Exercise Basics: Building Your Action Plan Clinical staff led group instruction and group discussion with PowerPoint presentation and patient guidebook. To enhance the learning environment the use of posters, models and videos may be added. At the conclusion of this workshop, patients will comprehend the difference between physical activity and exercise, as well as the benefits of incorporating both, into their routine. Patients will understand the FITT (Frequency, Intensity, Time, and Type) principle and how to use it to build an exercise action plan. In addition, safety concerns and other considerations for exercise and cardiac rehab will be addressed by the presenter. The purpose of this lesson is to promote a comprehensive and effective weekly exercise routine in order to improve patients' overall level of fitness.   Managing Heart Disease: Your Path to a Healthier Heart Clinical staff led group instruction and group discussion with PowerPoint presentation and patient guidebook. To enhance the learning environment the use of posters, models and videos may be added.At the conclusion of this workshop, patients will understand  the anatomy and physiology of the heart. Additionally, they will understand how Pritikin's three pillars impact the risk factors, the progression, and the management of heart disease.  The purpose of this lesson is to provide a high-level overview of the heart, heart disease, and how the Pritikin lifestyle positively impacts risk factors.  Exercise Biomechanics Clinical staff led group instruction and group discussion with PowerPoint presentation and patient guidebook. To enhance the learning environment the use of posters, models and videos may be added. Patients will learn how the structural parts of their bodies function and how these functions impact their daily activities, movement, and exercise. Patients will learn how to promote a neutral spine, learn how to manage pain, and identify ways to improve their physical movement in order to promote healthy living. The purpose of this lesson is to expose patients to common physical limitations that impact physical activity. Participants will learn practical ways to adapt and manage aches and pains, and to minimize their effect on regular exercise. Patients will learn how to maintain good posture while sitting, walking, and lifting.  Balance Training and Fall Prevention  Clinical staff led group instruction and group discussion with PowerPoint presentation and patient guidebook. To enhance the learning environment the use of posters, models and videos may be added. At the conclusion of this workshop, patients will understand the importance of their sensorimotor skills (vision, proprioception, and the vestibular system) in maintaining their ability to balance as they age. Patients will apply a variety of balancing exercises that are appropriate for their current level of function. Patients will understand the common causes for poor balance, possible solutions to these problems, and ways to modify their physical environment in order to minimize  their fall risk. The purpose of this lesson is to teach patients about the importance of maintaining balance as they age and ways to minimize their risk of falling.  WORKSHOPS   Nutrition:  Fueling a Scientist, research (physical sciences) led group instruction and group discussion with PowerPoint presentation and patient guidebook. To enhance the learning environment the use of posters, models and videos may be added. Patients will review the foundational principles of the [place] and understand what constitutes a serving size in each of the food groups. Patients will also learn Pritikin-friendly foods that are better choices  when away from home and review make-ahead meal and snack options. Calorie density will be reviewed and applied to three nutrition priorities: weight maintenance, weight loss, and weight gain. The purpose of this lesson is to reinforce (in a group setting) the key concepts around what patients are recommended to eat and how to apply these guidelines when away from home by planning and selecting Pritikin-friendly options. Patients will understand how calorie density may be adjusted for different weight management goals.  Mindful Eating  Clinical staff led group instruction and group discussion with PowerPoint presentation and patient guidebook. To enhance the learning environment the use of posters, models and videos may be added. Patients will briefly review the concepts of the [place] and the importance of low-calorie dense foods. The concept of mindful eating will be introduced as well as the importance of paying attention to internal hunger signals. Triggers for non-hunger eating and techniques for dealing with triggers will be explored. The purpose of this lesson is to provide patients with the opportunity to review the basic principles of the [place], discuss the value of eating mindfully and how to measure internal cues of hunger and fullness using the  Hunger Scale. Patients will also discuss reasons for non-hunger eating and learn strategies to use for controlling emotional eating.  Targeting Your Nutrition Priorities Clinical staff led group instruction and group discussion with PowerPoint presentation and patient guidebook. To enhance the learning environment the use of posters, models and videos may be added. Patients will learn how to determine their genetic susceptibility to disease by reviewing their family history. Patients will gain insight into the importance of diet as part of an overall healthy lifestyle in mitigating the impact of genetics and other environmental insults. The purpose of this lesson is to provide patients with the opportunity to assess their personal nutrition priorities by looking at their family history, their own health history and current risk factors. Patients will also be able to discuss ways of prioritizing and modifying the [place] for their highest risk areas  Menu  Clinical staff led group instruction and group discussion with PowerPoint presentation and patient guidebook. To enhance the learning environment the use of posters, models and videos may be added. Using menus brought in from [employer], or printed from [employer], patients will apply the [place] dining out guidelines that were presented in the [employer] video. Patients will also be able to practice these guidelines in a variety of provided scenarios. The purpose of this lesson is to provide patients with the opportunity to practice hands-on learning of the [place] with actual menus and practice scenarios.  Label Reading Clinical staff led group instruction and group discussion with PowerPoint presentation and patient guidebook. To enhance the learning environment the use of posters, models and videos may be added. Patients will review and discuss the Pritikin label reading guidelines  presented in Pritikin's Label Reading Educational series video. Using fool labels brought in from local grocery stores and markets, patients will apply the label reading guidelines and determine if the packaged food meet the Pritikin guidelines. The purpose of this lesson is to provide patients with the opportunity to review, discuss, and practice hands-on learning of the Pritikin Label Reading guidelines with actual packaged food labels. [place] Workshops are designed to teach patients ways to prepare quick, simple, and affordable recipes at home. The importance of nutrition's role in chronic disease risk reduction  is reflected in its emphasis in the overall Pritikin program. By learning how to prepare essential core Pritikin Eating Plan recipes, patients will increase control over what they eat; be able to customize the flavor of foods without the use of added salt, sugar, or fat; and improve the quality of the food they consume. By learning a set of core recipes which are easily assembled, quickly prepared, and affordable, patients are more likely to prepare more healthy foods at home. These workshops focus on convenient breakfasts, simple entres, side dishes, and desserts which can be prepared with minimal effort and are consistent with nutrition recommendations for cardiovascular risk reduction. Cooking [employer] are taught by a Engineer, materials (RD) who has been trained by the [employer]. The chef or RD has a clear understanding of the importance of minimizing - if not completely eliminating - added fat, sugar, and sodium in recipes. Throughout the series of [place] Workshop sessions, patients will learn about healthy ingredients and efficient methods of cooking to build confidence in their capability to prepare    Cooking School weekly topics:  Adding Flavor- Sodium-Free  Fast and Healthy Breakfasts  Powerhouse Plant-Based  Proteins  Satisfying Salads and Dressings  Simple Sides and Sauces  International Cuisine-Spotlight on the [place] Zones  Delicious Desserts  Savory Soups  Efficiency Cooking - Meals in a Snap  Tasty Appetizers and Snacks  Comforting Weekend Breakfasts  One-Pot Wonders   Fast Evening Meals  Easy [place] (Psychosocial): New Thoughts, New Behaviors Clinical staff led group instruction and group discussion with PowerPoint presentation and patient guidebook. To enhance the learning environment the use of posters, models and videos may be added. Patients will learn and practice techniques for developing effective health and lifestyle goals. Patients will be able to effectively apply the goal setting process learned to develop at least one new personal goal.  The purpose of this lesson is to expose patients to a new skill set of behavior modification techniques such as techniques setting SMART goals, overcoming barriers, and achieving new thoughts and new behaviors.  Managing Moods and Relationships Clinical staff led group instruction and group discussion with PowerPoint presentation and patient guidebook. To enhance the learning environment the use of posters, models and videos may be added. Patients will learn how emotional and chronic stress factors can impact their health and relationships. They will learn healthy ways to manage their moods and utilize positive coping mechanisms. In addition, ICR patients will learn ways to improve communication skills. The purpose of this lesson is to expose patients to ways of understanding how one's mood and health are intimately connected. Developing a healthy outlook can help build positive relationships and connections with others. Patients will understand the importance of utilizing effective communication skills that include actively listening and being heard. They will learn and  understand the importance of the "4 Cs" and especially Connections in fostering of a Healthy Mind-Set.  Healthy Sleep for a Healthy Heart Clinical staff led group instruction and group discussion with PowerPoint presentation and patient guidebook. To enhance the learning environment the use of posters, models and videos may be added. At the conclusion of this workshop, patients will be able to demonstrate knowledge of the importance of sleep to overall health, well-being, and quality of life. They will understand the symptoms of, and treatments for, common sleep disorders. Patients will also be able to identify daytime and nighttime behaviors  which impact sleep, and they will be able to apply these tools to help manage sleep-related challenges. The purpose of this lesson is to provide patients with a general overview of sleep and outline the importance of quality sleep. Patients will learn about a few of the most common sleep disorders. Patients will also be introduced to the concept of "sleep hygiene," and discover ways to self-manage certain sleeping problems through simple daily behavior changes. Finally, the workshop will motivate patients by clarifying the links between quality sleep and their goals of heart-healthy living.   Recognizing and Reducing Stress Clinical staff led group instruction and group discussion with PowerPoint presentation and patient guidebook. To enhance the learning environment the use of posters, models and videos may be added. At the conclusion of this workshop, patients will be able to understand the types of stress reactions, differentiate between acute and chronic stress, and recognize the impact that chronic stress has on their health. They will also be able to apply different coping mechanisms, such as reframing negative self-talk. Patients will have the opportunity to practice a variety of stress management techniques, such as deep abdominal breathing, progressive muscle  relaxation, and/or guided imagery.  The purpose of this lesson is to educate patients on the role of stress in their lives and to provide healthy techniques for coping with it.  Learning Barriers/Preferences:  Learning Barriers/Preferences - 07/14/22 1614       Learning Barriers/Preferences   Learning Barriers Sight   Wears glasses   Learning Preferences Written Material;Pictoral             Education Topics:  Knowledge Questionnaire Score:  Knowledge Questionnaire Score - 07/14/22 1502       Knowledge Questionnaire Score   Pre Score 19/24             Core Components/Risk Factors/Patient Goals at Admission:  Personal Goals and Risk Factors at Admission - 07/14/22 1557       Core Components/Risk Factors/Patient Goals on Admission    Weight Management Yes;Obesity;Weight Loss    Intervention Weight Management/Obesity: Establish reasonable short term and long term weight goals.;Obesity: Provide education and appropriate resources to help participant work on and attain dietary goals.    Admit Weight 254 lb 6.6 oz (115.4 kg)    Goal Weight: Short Term 248 lb (112.5 kg)    Goal Weight: Long Term 174 lb (78.9 kg)    Expected Outcomes Short Term: Continue to assess and modify interventions until short term weight is achieved;Long Term: Adherence to nutrition and physical activity/exercise program aimed toward attainment of established weight goal;Weight Loss: Understanding of general recommendations for a balanced deficit meal plan, which promotes 1-2 lb weight loss per week and includes a negative energy balance of [phone] kcal/d    Diabetes Yes    Intervention Provide education about signs/symptoms and action to take for hypo/hyperglycemia.;Provide education about proper nutrition, including hydration, and aerobic/resistive exercise prescription along with prescribed medications to achieve blood glucose in normal ranges: Fasting glucose 65-99 mg/dL    Expected Outcomes Short  Term: Participant verbalizes understanding of the signs/symptoms and immediate care of hyper/hypoglycemia, proper foot care and importance of medication, aerobic/resistive exercise and nutrition plan for blood glucose control.;Long Term: Attainment of HbA1C < 7%.    Hypertension Yes    Intervention Provide education on lifestyle modifcations including regular physical activity/exercise, weight management, moderate sodium restriction and increased consumption of fresh fruit, vegetables, and low fat dairy, alcohol moderation, and smoking cessation.;Monitor prescription use compliance.  Expected Outcomes Short Term: Continued assessment and intervention until BP is < 140/19m HG in hypertensive participants. < 130/828mHG in hypertensive participants with diabetes, heart failure or chronic kidney disease.;Long Term: Maintenance of blood pressure at goal levels.    Lipids Yes    Intervention Provide education and support for participant on nutrition & aerobic/resistive exercise along with prescribed medications to achieve LDL '70mg'$ , HDL >'40mg'$ .    Expected Outcomes Short Term: Participant states understanding of desired cholesterol values and is compliant with medications prescribed. Participant is following exercise prescription and nutrition guidelines.;Long Term: Cholesterol controlled with medications as prescribed, with individualized exercise RX and with personalized nutrition plan. Value goals: LDL < '70mg'$ , HDL > 40 mg.    Stress Yes    Intervention Offer individual and/or small group education and counseling on adjustment to heart disease, stress management and health-related lifestyle change. Teach and support self-help strategies.;Refer participants experiencing significant psychosocial distress to appropriate mental health specialists for further evaluation and treatment. When possible, include family members and significant others in education/counseling sessions.    Expected Outcomes Short Term:  Participant demonstrates changes in health-related behavior, relaxation and other stress management skills, ability to obtain effective social support, and compliance with psychotropic medications if prescribed.;Long Term: Emotional wellbeing is indicated by absence of clinically significant psychosocial distress or social isolation.             Core Components/Risk Factors/Patient Goals Review:    Core Components/Risk Factors/Patient Goals at Discharge (Final Review):    ITP Comments:  ITP Comments     Row Name 07/14/22 1335           ITP Comments Medical Director- Dr. TrFransico HimMD. Introduction to [facility]/ Intensive Cardiac Rehab. Initial Orientation Packet reviewed with the patient.                Comments: Participant attended orientation for the cardiac rehabilitation program on  07/14/2022  to perform initial intake and exercise walk test. [place] and orientation packet was reviewed. Completed 6-minute walk test, measurements, initial ITP, and exercise prescription. Vital signs stable. Telemetry-normal sinus rhythm with rare PVC, c/o right sided chest discomfort and upper, mid back discomfort "5/10" on the pain scale, which resolved with rest. Patient has cardiology appointment with Dr. PaVirgina Jockomorrow.   Service time was from 1335 to 1550.

## 2022-07-15 ENCOUNTER — Encounter: Payer: Self-pay | Admitting: Cardiology

## 2022-07-15 ENCOUNTER — Ambulatory Visit: Payer: Medicaid Other | Admitting: Cardiology

## 2022-07-15 VITALS — BP 138/81 | HR 88 | Temp 97.8°F | Resp 16 | Ht 61.0 in | Wt 255.0 lb

## 2022-07-15 DIAGNOSIS — E782 Mixed hyperlipidemia: Secondary | ICD-10-CM

## 2022-07-15 DIAGNOSIS — I25118 Atherosclerotic heart disease of native coronary artery with other forms of angina pectoris: Secondary | ICD-10-CM

## 2022-07-15 DIAGNOSIS — R072 Precordial pain: Secondary | ICD-10-CM

## 2022-07-15 NOTE — Progress Notes (Signed)
Follow up visit  Subjective:   [name], female    DOB: Aug 22, 1966, 56 y.o.   MRN: [id number]     HPI  Chief Complaint  Patient presents with   Follow-up   Chest Pain   Left arm pain     56 y/o African-American female with hypertension, type 2 diabetes mellitus, CAD, postmenopausal vaginal bleeding, anemia  Patient made today's appointment due to an episode of chest pain.  Patient reported this during cardiac rehab yesterday.  She had an episode of left sided shoulder pain radiating to front of her chest, while doing some heavy lifting over the weekend.  Since then, her shoulder has been constantly painful, worse with certain movements.  She has not had shortness of breath similar to what she experienced before her PCI.  She was able to perform the orientation exercise at cardiac rehab without any significant pain.   Current Outpatient Medications:    acetaminophen (TYLENOL) 325 MG tablet, Take 650 mg by mouth every 6 (six) hours as needed (pain.)., Disp: , Rfl:    amLODipine (NORVASC) 5 MG tablet, Take 1 tablet (5 mg total) by mouth daily., Disp: 30 tablet, Rfl: 1   aspirin 81 MG chewable tablet, Chew 81 mg by mouth in the morning., Disp: , Rfl:    atorvastatin (LIPITOR) 40 MG tablet, Take 1 tablet (40 mg total) by mouth every evening., Disp: 90 tablet, Rfl: 3   Evolocumab (REPATHA SURECLICK) 563 MG/ML SOAJ, Inject 140 mg into the skin every 14 (fourteen) days., Disp: 6 mL, Rfl: 5   Ferrous Sulfate (IRON) 325 (65 FE) MG TABS, Take 325 mg by mouth 2 (two) times daily., Disp: , Rfl:    furosemide (LASIX) 40 MG tablet, Take 1 tablet (40 mg total) by mouth in the morning., Disp: 30 tablet, Rfl: 6   glipiZIDE (GLUCOTROL XL) 5 MG 24 hr tablet, Take 5 mg by mouth daily with breakfast., Disp: , Rfl:    metFORMIN (GLUCOPHAGE) 1000 MG tablet, Take 1,000 mg by mouth 2 (two) times daily with a meal., Disp: , Rfl:    metoprolol tartrate (LOPRESSOR) 50 MG tablet, Take 1 tablet (50 mg  total) by mouth 2 (two) times daily., Disp: 180 tablet, Rfl: 3   Semaglutide (OZEMPIC, 0.25 OR 0.5 MG/DOSE, [place]), Inject 0.5 mg into the skin every Sunday., Disp: , Rfl:    ticagrelor (BRILINTA) 90 MG TABS tablet, Take 1 tablet (90 mg total) by mouth 2 (two) times daily., Disp: 60 tablet, Rfl: 6   Cardiovascular & other pertient studies:  Reviewed external labs and tests, independently interpreted  EKG 07/15/2022: Sinus rhythm 94 bpm  Nonspecific T-abnormality  EKG 06/12/2022: Sinus tachycardia 106 bpm  Nonspecific T-abnormality  Coronary intervention 05/26/2022: OM1: 90% complex lesion Intravascular ultrasound Successful PTCA OM1 with 2.0X15 Scoreflex and 2.0X6 mm [place] cutting balloons    Coronary intervention 03/10/2022:  LV: 168/80, EDP 19 mmHg.  Ao 196/90, mean 134 mmHg.  No pressure gradient across the aortic valve. LM: Moderate caliber vessel.  No significant disease. LAD: Moderate caliber vessel throughout.  Mild diffuse disease.  Gives origin to several small diagonals, diagonal 2 and 3 which arises as bifurcation in the midsegment of the LAD have mild ostial disease.  Mid to distal segment of the LAD has a focal 40% stenosis. CX: Again a moderate caliber vessel giving origin to a very large territory distribution OM1, a 2.0 x 2.25 mm vessel with a high-grade ulcerated proximal tandem 95 to 99%  stenosis.  OM 2 is very small caliber and again, moderate area of distribution, ostial 50% stenosis.  Distal circumflex has a 40% stenosis. RCA: The mid segment of the RCA has a stent from 2013 and a 2.75 x 23 mm Xience stent from 10/28/2019.  These are overlapping.  The candy wrapper stenosis both at the inflow and outflow of the old stent from 2013 (proximal ISR, distal new lesion).  There is a 60 to 70% proximal stenosis starting from the proximal segment of the previously placed stent from 2021.   Interventional data: Difficult procedure due to poor visualization and resilient in-stent  restenosis.  2.75 x 15 mm score flex balloon angioplasty at 20 atmospheric pressure followed by stenting with a 2.75 x 32 mm Synergy XT DES covering the entire in-stent restenosis to outflow stenosis of previously placed stent from 2013. Successful stenting of the proximal to mid RCA overlapping stent that was placed on 10/28/2019 and the distal edge with a 2.74 x 20 mm Synergy XD deployed at 16 atmospheric pressure.  The entire stented segment was postdilated to 20 atm pressure giving a 3 mm lumen.  Recommendation: Patient extremely high risk for recurrent restenosis and progression of coronary disease in view of uncontrolled hypertension, diabetes mellitus and noncompliance with medical advice.  Reiterate risks to the patient.  Aspirin indefinitely and Brilinta for at least 1 year in view of NSTEMI.  Patient will be scheduled for elective stage intervention to a very large OM1 tomorrow if she remains stable through the right.  Although OM1 is 2.0-2.25 mm vessel, has a very large area of distribution and a high-grade ulcerated 99% stenosis.  She is at risk for developing recurrent ACS if untreated.  175 mL contrast utilized  Recent labs: 05/27/2022: Chol 143, TG 69, HDL 35, LDL 94  04/02/2022: Glucose 242, BUN/Cr 17/0.96. EGFR >60. Na/K 139/4.1.  H/H 8.2/25. MCV 76. Platelets 315 HbA1C 8.7% Lipid panel NA Lipoprotein a 196  Latest Reference Range & Units 04/02/22 15:48 04/02/22 16:08  Troponin I (High Sensitivity) <18 ng/L 11 10     Review of Systems  Cardiovascular:  Negative for chest pain, dyspnea on exertion, leg swelling, palpitations and syncope.       Vitals:   07/15/22 1518  BP: 138/81  Pulse: 88  Resp: 16  Temp: 97.8 F (36.6 C)  SpO2: 100%    Body mass index is 48.18 kg/m. Filed Weights   07/15/22 1518  Weight: 255 lb (115.7 kg)     Objective:   Physical Exam Vitals and nursing note reviewed.  Constitutional:      General: She is not in acute distress. Neck:      Vascular: No JVD.  Cardiovascular:     Rate and Rhythm: Normal rate and regular rhythm.     Heart sounds: Normal heart sounds. No murmur heard. Pulmonary:     Effort: Pulmonary effort is normal.     Breath sounds: Normal breath sounds. No wheezing or rales.  Musculoskeletal:     Right lower leg: No edema.     Left lower leg: No edema.            Visit diagnoses:   ICD-10-CM   1. Precordial pain  R07.2 EKG 12-Lead    2. Coronary artery disease of native artery of native heart with stable angina pectoris ([place])  I25.118 Lipid panel    Lipid panel    3. Mixed hyperlipidemia  E78.2 Lipid panel    Lipid panel  Orders Placed This Encounter  Procedures   Lipid panel   EKG 12-Lead     Assessment & Recommendations:   56 y/o African-American female with hypertension, type 2 diabetes mellitus, CAD, postmenopausal vaginal bleeding, anemia  CAD: S/p multivessel PCI with resolution of angina symptoms (last seen 05/2022). Recent chest pain/shoulder symptoms most likely musculoskeletal. Continue cardiac rehab with close monitoring of symptoms and EKG. If she having recurrence, will perform stress testing. Recommend dual antiplatelet therapy with aspirin and Brilinta at least till 02/2023. Continue rest of the baseline antianginal therapy.   Given LDL of 94 and lipoprotein a of 196 on Crestor milligram daily, I recommend adding Repatha.    Mixed hyperlipidemia: As above  Hypertension: Well-controlled.  F/u in 4 weeks    [name], MD Pager: [phone] Office: [phone]

## 2022-07-17 ENCOUNTER — Encounter: Payer: Self-pay | Admitting: Cardiology

## 2022-07-20 ENCOUNTER — Encounter (HOSPITAL_COMMUNITY)
Admission: RE | Admit: 2022-07-20 | Discharge: 2022-07-20 | Disposition: A | Discharge status: Home / Self Care | Payer: Medicaid Other | Source: Ambulatory Visit | Attending: Cardiology | Admitting: Cardiology

## 2022-07-20 ENCOUNTER — Telehealth (HOSPITAL_COMMUNITY): Payer: Self-pay | Admitting: *Deleted

## 2022-07-20 DIAGNOSIS — Z9861 Coronary angioplasty status: Secondary | ICD-10-CM | POA: Diagnosis present

## 2022-07-20 LAB — GLUCOSE, CAPILLARY
Glucose-Capillary: 239 mg/dL — ABNORMAL HIGH (ref 70–99)
Glucose-Capillary: 191 mg/dL — ABNORMAL HIGH (ref 70–99)

## 2022-07-20 NOTE — Telephone Encounter (Signed)
Confirmed appointment. Completed health history.Barnet Pall, RN,BSN 07/20/2022 4:17 PM

## 2022-07-20 NOTE — Progress Notes (Signed)
Daily Session Note  Patient Details  Name: [name] MRN: [id number] Date of Birth: Jul 25, 1966 Referring Provider:   Flowsheet Row CARDIAC REHAB PHASE II ORIENTATION from 07/14/2022 in [place]  Referring Provider [name], MD       Encounter Date: 07/20/2022  Check In:  Session Check In - 07/20/22 1441       Check-In   Supervising physician immediately available to respond to emergencies [facility] - Physician supervision    Physician(s) Dr. Radford Pax    Location [facility]    Staff Present Seward Carol, MS, ACSM-CEP, Exercise Physiologist;Reilly Blades, RN, BSN;Samantha [place], RD, LDN;Johnny Starleen Blue, MS, Exercise Physiologist;Jetta Walker BS, ACSM-CEP, Exercise Physiologist    Virtual Visit No    Medication changes reported     No    Fall or balance concerns reported    No    Tobacco Cessation No Change    Warm-up and Cool-down Performed as group-led instruction    Resistance Training Performed Yes    VAD Patient? No    PAD/SET Patient? No      Pain Assessment   Currently in Pain? No/denies    Pain Score 0-No pain    Multiple Pain Sites No             Capillary Blood Glucose: Results for orders placed or performed during the hospital encounter of 07/20/22 (from the past 24 hour(s))  Glucose, capillary     Status: Abnormal   Collection Time: 07/20/22  2:58 PM  Result Value Ref Range   Glucose-Capillary 239 (H) 70 - 99 mg/dL  Glucose, capillary     Status: Abnormal   Collection Time: 07/20/22  4:12 PM  Result Value Ref Range   Glucose-Capillary 191 (H) 70 - 99 mg/dL     Exercise Prescription Changes - 07/20/22 1617       Response to Exercise   Blood Pressure (Admit) 126/84    Blood Pressure (Exercise) 124/84    Blood Pressure (Exit) 114/72    Heart Rate (Admit) 84 bpm    Heart Rate (Exercise) 120 bpm    Heart Rate (Exit) 86 bpm    Symptoms 0    Comments Pt first day in the CRP2 program     Duration Progress to 30 minutes of  aerobic without signs/symptoms of physical distress    Intensity THRR New      Progression   Progression Continue to progress workloads to maintain intensity without signs/symptoms of physical distress.      Resistance Training   Training Prescription Yes    Weight 3 lbs    Reps 10-15    Time 1.97 Minutes      Treadmill   MPH 2   1.4 speed after 5 mins at level 2. MET's 2.07   Grade 0    Minutes 15    METs 2.53      NuStep   Level 2    SPM 85    Minutes 15    METs 1.3             Social History   Tobacco Use  Smoking Status Former   Packs/day: 0.50   Years: 5.00   Total pack years: 2.50   Types: Cigarettes   Quit date: 10/19/1993   Years since quitting: 28.7  Smokeless Tobacco Never    Goals Met:  Exercise tolerated well No report of concerns or symptoms today Strength training completed today  Goals  Unmet:  Not Applicable  Comments: [facility] started cardiac rehab today.  Pt tolerated light exercise without difficulty. VSS, telemetry-Sinus Rhythm, asymptomatic.  Medication list reconciled. Pt denies barriers to medicaiton compliance.  PSYCHOSOCIAL ASSESSMENT:  PHQ-0. Pt exhibits positive coping skills, hopeful outlook with supportive family. No psychosocial needs identified at this time, no psychosocial interventions necessary.    Pt enjoys walking.   Pt oriented to exercise equipment and routine.    Understanding verbalized. Harrell Gave RN BSN    Dr. Fransico Him is Medical Director for Cardiac Rehab at [facility].

## 2022-07-22 ENCOUNTER — Encounter (HOSPITAL_COMMUNITY)
Admission: RE | Admit: 2022-07-22 | Discharge: 2022-07-22 | Disposition: A | Discharge status: Home / Self Care | Payer: Medicaid Other | Source: Ambulatory Visit | Attending: Cardiology | Admitting: Cardiology

## 2022-07-22 DIAGNOSIS — Z9861 Coronary angioplasty status: Secondary | ICD-10-CM

## 2022-07-22 LAB — GLUCOSE, CAPILLARY
Glucose-Capillary: 216 mg/dL — ABNORMAL HIGH (ref 70–99)
Glucose-Capillary: 179 mg/dL — ABNORMAL HIGH (ref 70–99)

## 2022-07-24 ENCOUNTER — Encounter (HOSPITAL_COMMUNITY): Payer: Self-pay | Admitting: Emergency Medicine

## 2022-07-24 ENCOUNTER — Emergency Department (HOSPITAL_COMMUNITY): Payer: Medicaid Other

## 2022-07-24 ENCOUNTER — Other Ambulatory Visit: Payer: Self-pay

## 2022-07-24 ENCOUNTER — Encounter (HOSPITAL_COMMUNITY)
Admission: RE | Admit: 2022-07-24 | Discharge: 2022-07-24 | Disposition: A | Discharge status: Home / Self Care | Payer: Medicaid Other | Source: Ambulatory Visit | Attending: Cardiology | Admitting: Cardiology

## 2022-07-24 ENCOUNTER — Inpatient Hospital Stay (HOSPITAL_COMMUNITY)
Admission: EM | Admit: 2022-07-24 | Discharge: 2022-07-28 | DRG: 287 | Disposition: A | Discharge status: Home / Self Care | Payer: Medicaid Other | Source: Ambulatory Visit | Attending: Family Medicine | Admitting: Family Medicine

## 2022-07-24 DIAGNOSIS — E119 Type 2 diabetes mellitus without complications: Secondary | ICD-10-CM

## 2022-07-24 DIAGNOSIS — R0781 Pleurodynia: Secondary | ICD-10-CM | POA: Diagnosis present

## 2022-07-24 DIAGNOSIS — M79605 Pain in left leg: Secondary | ICD-10-CM | POA: Diagnosis present

## 2022-07-24 DIAGNOSIS — G4733 Obstructive sleep apnea (adult) (pediatric): Secondary | ICD-10-CM | POA: Diagnosis present

## 2022-07-24 DIAGNOSIS — M5030 Other cervical disc degeneration, unspecified cervical region: Secondary | ICD-10-CM | POA: Diagnosis present

## 2022-07-24 DIAGNOSIS — Z86718 Personal history of other venous thrombosis and embolism: Secondary | ICD-10-CM

## 2022-07-24 DIAGNOSIS — Z955 Presence of coronary angioplasty implant and graft: Secondary | ICD-10-CM

## 2022-07-24 DIAGNOSIS — D509 Iron deficiency anemia, unspecified: Secondary | ICD-10-CM | POA: Diagnosis present

## 2022-07-24 DIAGNOSIS — Z7984 Long term (current) use of oral hypoglycemic drugs: Secondary | ICD-10-CM

## 2022-07-24 DIAGNOSIS — Z823 Family history of stroke: Secondary | ICD-10-CM

## 2022-07-24 DIAGNOSIS — I2510 Atherosclerotic heart disease of native coronary artery without angina pectoris: Secondary | ICD-10-CM

## 2022-07-24 DIAGNOSIS — I252 Old myocardial infarction: Secondary | ICD-10-CM

## 2022-07-24 DIAGNOSIS — E785 Hyperlipidemia, unspecified: Secondary | ICD-10-CM

## 2022-07-24 DIAGNOSIS — E1165 Type 2 diabetes mellitus with hyperglycemia: Secondary | ICD-10-CM | POA: Diagnosis present

## 2022-07-24 DIAGNOSIS — Z7982 Long term (current) use of aspirin: Secondary | ICD-10-CM

## 2022-07-24 DIAGNOSIS — Z9851 Tubal ligation status: Secondary | ICD-10-CM

## 2022-07-24 DIAGNOSIS — R000 Tachycardia, unspecified: Secondary | ICD-10-CM | POA: Diagnosis not present

## 2022-07-24 DIAGNOSIS — I5032 Chronic diastolic (congestive) heart failure: Secondary | ICD-10-CM | POA: Diagnosis present

## 2022-07-24 DIAGNOSIS — Z87891 Personal history of nicotine dependence: Secondary | ICD-10-CM

## 2022-07-24 DIAGNOSIS — I110 Hypertensive heart disease with heart failure: Secondary | ICD-10-CM | POA: Diagnosis present

## 2022-07-24 DIAGNOSIS — Z6841 Body Mass Index (BMI) 40.0 and over, adult: Secondary | ICD-10-CM

## 2022-07-24 DIAGNOSIS — E782 Mixed hyperlipidemia: Secondary | ICD-10-CM | POA: Diagnosis present

## 2022-07-24 DIAGNOSIS — M79602 Pain in left arm: Secondary | ICD-10-CM | POA: Diagnosis present

## 2022-07-24 DIAGNOSIS — Z8249 Family history of ischemic heart disease and other diseases of the circulatory system: Secondary | ICD-10-CM

## 2022-07-24 DIAGNOSIS — I25118 Atherosclerotic heart disease of native coronary artery with other forms of angina pectoris: Principal | ICD-10-CM | POA: Diagnosis present

## 2022-07-24 DIAGNOSIS — Z9861 Coronary angioplasty status: Secondary | ICD-10-CM

## 2022-07-24 DIAGNOSIS — R0682 Tachypnea, not elsewhere classified: Secondary | ICD-10-CM | POA: Diagnosis present

## 2022-07-24 DIAGNOSIS — Z888 Allergy status to other drugs, medicaments and biological substances status: Secondary | ICD-10-CM

## 2022-07-24 DIAGNOSIS — I10 Essential (primary) hypertension: Secondary | ICD-10-CM | POA: Diagnosis present

## 2022-07-24 DIAGNOSIS — Z8742 Personal history of other diseases of the female genital tract: Secondary | ICD-10-CM

## 2022-07-24 DIAGNOSIS — Z79899 Other long term (current) drug therapy: Secondary | ICD-10-CM

## 2022-07-24 DIAGNOSIS — R079 Chest pain, unspecified: Principal | ICD-10-CM

## 2022-07-24 DIAGNOSIS — M549 Dorsalgia, unspecified: Secondary | ICD-10-CM | POA: Diagnosis present

## 2022-07-24 DIAGNOSIS — Z7902 Long term (current) use of antithrombotics/antiplatelets: Secondary | ICD-10-CM

## 2022-07-24 DIAGNOSIS — Z87440 Personal history of urinary (tract) infections: Secondary | ICD-10-CM

## 2022-07-24 DIAGNOSIS — Z7985 Long-term (current) use of injectable non-insulin antidiabetic drugs: Secondary | ICD-10-CM

## 2022-07-24 LAB — CBC
HCT: 27.6 % — ABNORMAL LOW (ref 36.0–46.0)
Hemoglobin: 8.7 g/dL — ABNORMAL LOW (ref 12.0–15.0)
MCH: 22 pg — ABNORMAL LOW (ref 26.0–34.0)
MCHC: 31.5 g/dL (ref 30.0–36.0)
MCV: 69.9 fL — ABNORMAL LOW (ref 80.0–100.0)
Platelets: 346 10*3/uL (ref 150–400)
RBC: 3.95 MIL/uL (ref 3.87–5.11)
RDW: 15.9 % — ABNORMAL HIGH (ref 11.5–15.5)
WBC: 6.4 10*3/uL (ref 4.0–10.5)
nRBC: 0 % (ref 0.0–0.2)

## 2022-07-24 LAB — BASIC METABOLIC PANEL
Anion gap: 10 (ref 5–15)
BUN: 17 mg/dL (ref 6–20)
CO2: 24 mmol/L (ref 22–32)
Calcium: 9.4 mg/dL (ref 8.9–10.3)
Chloride: 104 mmol/L (ref 98–111)
Creatinine, Ser: 1.03 mg/dL — ABNORMAL HIGH (ref 0.44–1.00)
GFR, Estimated: >60 mL/min (ref >60)
Glucose, Bld: 167 mg/dL — ABNORMAL HIGH (ref 70–99)
Potassium: 3.8 mmol/L (ref 3.5–5.1)
Sodium: 138 mmol/L (ref 135–145)

## 2022-07-24 LAB — TROPONIN I (HIGH SENSITIVITY): Troponin I (High Sensitivity): 5 ng/L (ref <18)

## 2022-07-24 MED ORDER — ASPIRIN 81 MG PO CHEW
324.0000 mg | CHEWABLE_TABLET | Freq: Once | ORAL | Status: AC
  Administered 2022-07-25: 324 mg via ORAL
  Filled 2022-07-24: qty 4

## 2022-07-24 NOTE — ED Triage Notes (Signed)
Patient states she was brought down from cardiac rehab c/o burning sensation to left arm and mid back pain radiating into right side of back. Pain was there prior to rehab but became more severe during. Was placed on 4L [place] during rehab and states has helped relieve the back pain. Denies any chest pain or shortness of breath.

## 2022-07-24 NOTE — Progress Notes (Signed)
[name] reported having  central back pain and left arm pain rated a 7/10 while walking on the treadmill. [name] says the pain is similar to prior to having her angioplasty in August. Exercise stopped and 9 minutes. Blood pressure 142/90. Oxygen saturation 100% on room air. Heart rate 98. Sinus Rhythm. Patient placed on oxygen at 2/min. [name] said that the pain in her back went away but she is still experiencing burning in her left arm rate a 6/10. Oxygen  increased to 4l/min. 12 lead ECG obtained per standing chest pain orders. Dr Virgina Jock paged and notified. Recommended taking patient to the ED for further evaluation. Patient's daughter [name] called and notified about today's events. Patient transported to ED via transport chair on 4l/min of oxygen on a portable monitor. ED charge nurse called and notified. Brief report given to Triage staff. [name] denied having further back pain but continued to have burning in her left arm/ left shoulder. Barnet Pall, RN,BSN 07/24/2022 4:46 PM

## 2022-07-24 NOTE — ED Provider Triage Note (Signed)
Emergency Medicine Provider Triage Evaluation Note  [name] , a 56 y.o. female  was evaluated in triage.  Pt complains of arm and back pain.  Patient has history of coronary artery disease.  She is status post PTCA.  Patient was in cardiac rehab exercising when she experiencing up burning pain in her left arm.  It went up into her neck and down her arm.  It was also in her back.  She is she is not having trouble with chest pain or shortness of breath.  Patient was sent to the ED for further evaluation  Review of Systems  Positive: Left arm pain Negative: Chest pain  Physical Exam  BP (!) 139/109   Pulse 86   Temp 98.2 F (36.8 C) (Oral)   Resp 18   Ht 1.549 m ('5\' 1"'$ )   Wt 115.7 kg   LMP 06/16/2019   SpO2 100%   BMI 48.18 kg/m  Gen:   Awake, no distress   Resp:  Normal effort  MSK:   Moves extremities without difficulty, mild tenderness palpation left paraspinal region left neck Other:  No weakness or sensory deficit in her upper extremities  Medical Decision Making  Medically screening exam initiated at 5:06 PM.  Appropriate orders placed.  [name] was informed that the remainder of the evaluation will be completed by another provider, this initial triage assessment does not replace that evaluation, and the importance of remaining in the ED until their evaluation is complete.  Question cervical radiculopathy.  Cardiac etiology is also concerned considering her history.  We will proceed with cardiac eval and add cervical spine films   Dorie Rank, MD 07/24/22 1709

## 2022-07-25 ENCOUNTER — Observation Stay (HOSPITAL_COMMUNITY): Payer: Medicaid Other

## 2022-07-25 ENCOUNTER — Observation Stay (HOSPITAL_BASED_OUTPATIENT_CLINIC_OR_DEPARTMENT_OTHER): Payer: Medicaid Other

## 2022-07-25 ENCOUNTER — Encounter (HOSPITAL_COMMUNITY): Payer: Self-pay | Admitting: Internal Medicine

## 2022-07-25 DIAGNOSIS — Z955 Presence of coronary angioplasty implant and graft: Secondary | ICD-10-CM

## 2022-07-25 DIAGNOSIS — I5032 Chronic diastolic (congestive) heart failure: Secondary | ICD-10-CM | POA: Diagnosis not present

## 2022-07-25 DIAGNOSIS — I10 Essential (primary) hypertension: Secondary | ICD-10-CM

## 2022-07-25 DIAGNOSIS — E1165 Type 2 diabetes mellitus with hyperglycemia: Secondary | ICD-10-CM

## 2022-07-25 DIAGNOSIS — R0781 Pleurodynia: Secondary | ICD-10-CM | POA: Diagnosis present

## 2022-07-25 DIAGNOSIS — E785 Hyperlipidemia, unspecified: Secondary | ICD-10-CM

## 2022-07-25 DIAGNOSIS — D509 Iron deficiency anemia, unspecified: Secondary | ICD-10-CM

## 2022-07-25 DIAGNOSIS — R0609 Other forms of dyspnea: Secondary | ICD-10-CM

## 2022-07-25 DIAGNOSIS — R079 Chest pain, unspecified: Secondary | ICD-10-CM | POA: Diagnosis not present

## 2022-07-25 DIAGNOSIS — E782 Mixed hyperlipidemia: Secondary | ICD-10-CM | POA: Insufficient documentation

## 2022-07-25 DIAGNOSIS — I25118 Atherosclerotic heart disease of native coronary artery with other forms of angina pectoris: Secondary | ICD-10-CM | POA: Diagnosis not present

## 2022-07-25 DIAGNOSIS — I252 Old myocardial infarction: Secondary | ICD-10-CM | POA: Insufficient documentation

## 2022-07-25 LAB — APTT: aPTT: 30 seconds (ref 24–36)

## 2022-07-25 LAB — HIV ANTIBODY (ROUTINE TESTING W REFLEX): HIV Screen 4th Generation wRfx: NONREACTIVE

## 2022-07-25 LAB — PROTIME-INR
INR: 1.1 (ref 0.8–1.2)
Prothrombin Time: 14 seconds (ref 11.4–15.2)

## 2022-07-25 LAB — CBG MONITORING, ED
Glucose-Capillary: 333 mg/dL — ABNORMAL HIGH (ref 70–99)
Glucose-Capillary: 293 mg/dL — ABNORMAL HIGH (ref 70–99)

## 2022-07-25 LAB — ECHOCARDIOGRAM COMPLETE
AR max vel: 1.68 cm2
AV Area VTI: 1.75 cm2
AV Area mean vel: 1.63 cm2
AV Mean grad: 6 mmHg
AV Peak grad: 11.7 mmHg
Ao pk vel: 1.71 m/s
Area-P 1/2: 4.15 cm2
Height: 61 in
S' Lateral: 2.8 cm
Weight: 4080 oz

## 2022-07-25 LAB — FERRITIN: Ferritin: 8 ng/mL — ABNORMAL LOW (ref 11–307)

## 2022-07-25 LAB — GLUCOSE, CAPILLARY
Glucose-Capillary: 214 mg/dL — ABNORMAL HIGH (ref 70–99)
Glucose-Capillary: 141 mg/dL — ABNORMAL HIGH (ref 70–99)

## 2022-07-25 LAB — IRON AND TIBC
Iron: 29 ug/dL (ref 28–170)
Saturation Ratios: 7 % — ABNORMAL LOW (ref 10.4–31.8)
TIBC: 392 ug/dL (ref 250–450)
UIBC: 363 ug/dL

## 2022-07-25 LAB — TROPONIN I (HIGH SENSITIVITY): Troponin I (High Sensitivity): 5 ng/L (ref <18)

## 2022-07-25 LAB — D-DIMER, QUANTITATIVE: D-Dimer, Quant: 0.71 ug/mL-FEU — ABNORMAL HIGH (ref 0.00–0.50)

## 2022-07-25 LAB — HCG, SERUM, QUALITATIVE: Preg, Serum: NEGATIVE

## 2022-07-25 MED ORDER — LABETALOL HCL 5 MG/ML IV SOLN: 10.0000 mg | Freq: Once | INTRAVENOUS | Status: AC

## 2022-07-25 MED ORDER — AMLODIPINE BESYLATE 10 MG PO TABS
10.0000 mg | ORAL_TABLET | Freq: Every evening | ORAL | Status: DC
  Administered 2022-07-25 – 2022-07-27 (×3): 10 mg via ORAL
  Filled 2022-07-25 (×3): qty 1

## 2022-07-25 MED ORDER — ATORVASTATIN CALCIUM 40 MG PO TABS
40.0000 mg | ORAL_TABLET | Freq: Every evening | ORAL | Status: DC
  Administered 2022-07-25 – 2022-07-27 (×3): 40 mg via ORAL
  Filled 2022-07-25 (×3): qty 1

## 2022-07-25 MED ORDER — FUROSEMIDE 40 MG PO TABS
40.0000 mg | ORAL_TABLET | Freq: Every morning | ORAL | Status: DC
  Administered 2022-07-25: 40 mg via ORAL
  Filled 2022-07-25: qty 2

## 2022-07-25 MED ORDER — LABETALOL HCL 5 MG/ML IV SOLN
INTRAVENOUS | Status: AC
  Administered 2022-07-25: 10 mg via INTRAVENOUS
  Filled 2022-07-25: qty 4

## 2022-07-25 MED ORDER — ASPIRIN 81 MG PO CHEW
81.0000 mg | CHEWABLE_TABLET | Freq: Every morning | ORAL | Status: DC
  Administered 2022-07-26 – 2022-07-28 (×3): 81 mg via ORAL
  Filled 2022-07-25 (×3): qty 1

## 2022-07-25 MED ORDER — FERROUS SULFATE 325 (65 FE) MG PO TABS
325.0000 mg | ORAL_TABLET | Freq: Two times a day (BID) | ORAL | Status: DC
  Administered 2022-07-25: 325 mg via ORAL
  Filled 2022-07-25 (×2): qty 1

## 2022-07-25 MED ORDER — EMPAGLIFLOZIN 10 MG PO TABS
10.0000 mg | ORAL_TABLET | Freq: Every day | ORAL | Status: DC
  Administered 2022-07-25 – 2022-07-28 (×4): 10 mg via ORAL
  Filled 2022-07-25 (×5): qty 1

## 2022-07-25 MED ORDER — LORAZEPAM 2 MG/ML IJ SOLN: 0.5000 mg | INTRAMUSCULAR | Status: DC | PRN

## 2022-07-25 MED ORDER — TECHNETIUM TC 99M TETROFOSMIN IV KIT
31.1000 | PACK | Freq: Once | INTRAVENOUS | Status: AC | PRN
  Administered 2022-07-25: 31.1 via INTRAVENOUS

## 2022-07-25 MED ORDER — ISOSORBIDE MONONITRATE ER 30 MG PO TB24
30.0000 mg | ORAL_TABLET | Freq: Every day | ORAL | Status: DC
  Administered 2022-07-26 – 2022-07-28 (×3): 30 mg via ORAL
  Filled 2022-07-25 (×3): qty 1

## 2022-07-25 MED ORDER — ONDANSETRON HCL 4 MG/2ML IJ SOLN: 4.0000 mg | Freq: Four times a day (QID) | INTRAMUSCULAR | Status: DC | PRN

## 2022-07-25 MED ORDER — INSULIN ASPART 100 UNIT/ML IJ SOLN
0.0000 [IU] | Freq: Three times a day (TID) | INTRAMUSCULAR | Status: DC
  Administered 2022-07-25: 5 [IU] via SUBCUTANEOUS
  Administered 2022-07-25 – 2022-07-26 (×2): 3 [IU] via SUBCUTANEOUS
  Administered 2022-07-26 (×2): 1 [IU] via SUBCUTANEOUS
  Administered 2022-07-27: 2 [IU] via SUBCUTANEOUS
  Administered 2022-07-27: 3 [IU] via SUBCUTANEOUS
  Administered 2022-07-28: 1 [IU] via SUBCUTANEOUS

## 2022-07-25 MED ORDER — ENOXAPARIN SODIUM 40 MG/0.4ML IJ SOSY
40.0000 mg | PREFILLED_SYRINGE | INTRAMUSCULAR | Status: DC
  Administered 2022-07-25 – 2022-07-28 (×3): 40 mg via SUBCUTANEOUS
  Filled 2022-07-25 (×3): qty 0.4

## 2022-07-25 MED ORDER — REGADENOSON 0.4 MG/5ML IV SOLN
INTRAVENOUS | Status: AC
  Administered 2022-07-25: 0.4 mg via INTRAVENOUS
  Filled 2022-07-25: qty 5

## 2022-07-25 MED ORDER — ALUM & MAG HYDROXIDE-SIMETH 200-200-20 MG/5ML PO SUSP: 30.0000 mL | Freq: Four times a day (QID) | ORAL | Status: DC | PRN

## 2022-07-25 MED ORDER — PERFLUTREN LIPID MICROSPHERE
1.0000 mL | INTRAVENOUS | Status: AC | PRN
  Administered 2022-07-25: 1 mL via INTRAVENOUS

## 2022-07-25 MED ORDER — ISOSORBIDE MONONITRATE ER 30 MG PO TB24
15.0000 mg | ORAL_TABLET | Freq: Every day | ORAL | Status: DC
  Administered 2022-07-25: 15 mg via ORAL
  Filled 2022-07-25: qty 1

## 2022-07-25 MED ORDER — ACETAMINOPHEN 325 MG PO TABS: 650.0000 mg | ORAL_TABLET | ORAL | Status: DC | PRN

## 2022-07-25 MED ORDER — TICAGRELOR 90 MG PO TABS
90.0000 mg | ORAL_TABLET | Freq: Two times a day (BID) | ORAL | Status: DC
  Administered 2022-07-25 – 2022-07-28 (×7): 90 mg via ORAL
  Filled 2022-07-25 (×7): qty 1

## 2022-07-25 MED ORDER — METOPROLOL SUCCINATE ER 50 MG PO TB24
50.0000 mg | ORAL_TABLET | Freq: Every day | ORAL | Status: DC
  Administered 2022-07-25 – 2022-07-26 (×2): 50 mg via ORAL
  Filled 2022-07-25: qty 2
  Filled 2022-07-25: qty 1

## 2022-07-25 MED ORDER — SODIUM CHLORIDE 0.9 % IV SOLN: INTRAVENOUS | Status: AC

## 2022-07-25 MED ORDER — LOSARTAN POTASSIUM 25 MG PO TABS
25.0000 mg | ORAL_TABLET | Freq: Every day | ORAL | Status: DC
  Administered 2022-07-25 – 2022-07-28 (×4): 25 mg via ORAL
  Filled 2022-07-25 (×4): qty 1

## 2022-07-25 MED ORDER — REGADENOSON 0.4 MG/5ML IV SOLN: 0.4000 mg | Freq: Once | INTRAVENOUS | Status: AC

## 2022-07-25 NOTE — Progress Notes (Signed)
CARDIOLOGY CONSULT NOTE  Patient ID: [name] MRN: [id number] DOB/AGE: Feb 02, 1966 56 y.o.  Admit date: 07/24/2022 Attending physician: Norval Morton, MD Primary Physician:  [facility] Cardiologist: Dr. Vernell Leep Inpatient Cardiologist: Rex Kras, DO, [facility]  Reason of consultation: Chest pain/arm pain Referring physician: Dr. Leonette Monarch  Chief complaint: Chest pain/arm pain  HPI:  [name] is a 56 y.o. African-American female who presents with a chief complaint of " chest pain/arm pain." Her past medical history and cardiovascular risk factors include: Hypertension, type 2 diabetes, CAD status post intervention, history of vaginal bleeding/anemia, obesity due to excess calories.  Patient presents to the ED yesterday 07/24/2022 from cardiac rehab when she was experiencing back pain and arm pain.  Patient states that she does have both of these discomfort on a chronic basis but yesterday they were flaring up.  When she came to the ED and had to walk to her room she had substernal discomfort, pressure-like feeling, lasting for few minutes, better with resting.  She intermittently continues to have chest pain.  The symptoms are similar in nature when compared to her NSTEMI in May 2023 but overall intensity is reduced.  Patient's blood pressure and heart rate are not well controlled during the current hospitalization.  However, states that her home blood pressures are usually around 120/82 and she checks them daily.  He is accompanied by her daughter at bedside who also provides collateral history.  Otherwise no active chest pain or heart failure symptoms.  Work-up in the ED has noted high sensitive troponins negative x2, EKG shows sinus rhythm with intermittent ST-T changes, recent echocardiogram notes preserved LVEF.  I spoke to the ED provider and recommended outpatient follow-up versus keeping her under observation and  doing a stress test.  Patient chose to proceed with the latter.  ALLERGIES: Allergies  Allergen Reactions   Detrol [Tolterodine Tartrate] Rash    PAST MEDICAL HISTORY: Past Medical History:  Diagnosis Date   Anemia    CAD (coronary artery disease)    PCI with stent to RCA in 2019   CHF (congestive heart failure) ([place]) 04/2011   EF 15-20% from echo 05/19/11   DOE (dyspnea on exertion)    DVT (deep venous thrombosis) ([place]) 06/2011   LLE   Fatigue    HTN (hypertension)    Hyperlipidemia    Menorrhagia    with iron deficient anemia   NSTEMI (non-ST elevated myocardial infarction) ([place]) 10/26/2017   NSTEMI (non-ST elevated myocardial infarction) ([place]) 10/27/2017   Obesity    Orthopnea    Type II diabetes mellitus ([place])     PAST SURGICAL HISTORY: Past Surgical History:  Procedure Laterality Date   CORONARY ANGIOPLASTY WITH STENT PLACEMENT  2013;  10/27/2017   CORONARY BALLOON ANGIOPLASTY N/A 05/26/2022   Procedure: CORONARY BALLOON ANGIOPLASTY;  Surgeon: [name], MD;  Location: [place] [facility];  Service: Cardiovascular;  Laterality: N/A;   CORONARY STENT INTERVENTION N/A 10/27/2017   Procedure: CORONARY STENT INTERVENTION;  Surgeon: [name], MD;  Location: [place] [facility];  Service: Cardiovascular;  Laterality: N/A;   CORONARY STENT INTERVENTION N/A 03/10/2022   Procedure: CORONARY STENT INTERVENTION;  Surgeon: [name], MD;  Location: [place] [facility];  Service: Cardiovascular;  Laterality: N/A;   CORONARY STENT INTERVENTION N/A 03/11/2022   Procedure: CORONARY STENT INTERVENTION;  Surgeon: [name], MD;  Location: [place] [facility];  Service: Cardiovascular;  Laterality: N/A;  aborted   INTRAVASCULAR ULTRASOUND/IVUS N/A 05/26/2022   Procedure: Intravascular Ultrasound/IVUS;  Surgeon: [name], MD;  Location: [place] [facility];  Service: Cardiovascular;  Laterality: N/A;   LEFT HEART CATH AND CORONARY ANGIOGRAPHY N/A  10/27/2017   Procedure: LEFT HEART CATH AND CORONARY ANGIOGRAPHY;  Surgeon: [name], MD;  Location: [place] [facility];  Service: Cardiovascular;  Laterality: N/A;   LEFT HEART CATH AND CORONARY ANGIOGRAPHY N/A 11/17/2019   Procedure: LEFT HEART CATH AND CORONARY ANGIOGRAPHY;  Surgeon: [name], MD;  Location: [place] [facility];  Service: Cardiovascular;  Laterality: N/A;   LEFT HEART CATH AND CORONARY ANGIOGRAPHY N/A 03/10/2022   Procedure: LEFT HEART CATH AND CORONARY ANGIOGRAPHY;  Surgeon: [name], MD;  Location: [place] [facility];  Service: Cardiovascular;  Laterality: N/A;   LEFT HEART CATHETERIZATION WITH CORONARY ANGIOGRAM N/A 11/17/2011   Procedure: LEFT HEART CATHETERIZATION WITH CORONARY ANGIOGRAM;  Surgeon: Laverda Page, MD;  [name];  Service: Cardiovascular;  Laterality: N/A;   OVARIAN CYST REMOVAL     TUBAL LIGATION  2006   ULTRASOUND GUIDANCE FOR VASCULAR ACCESS  10/27/2017   Procedure: Ultrasound Guidance For Vascular Access;  Surgeon: [name], MD;  Location: [place] [facility];  Service: Cardiovascular;;    FAMILY HISTORY: The patient's family history includes Heart attack in her sister; Hypertension in her mother; Stroke in her father and sister.   SOCIAL HISTORY:  The patient  reports that she quit smoking about 28 years ago. Her smoking use included cigarettes. She has a 2.50 pack-year smoking history. She has never used smokeless tobacco. She reports that she does not drink alcohol and does not use drugs.  MEDICATIONS: Current Outpatient Medications  Medication Instructions   acetaminophen (TYLENOL) 650 mg, Oral, Every 6 hours PRN   amLODipine (NORVASC) 5 mg, Oral, Daily   aspirin 81 mg, Oral, Every morning   atorvastatin (LIPITOR) 40 mg, Oral, Every evening   Brilinta 90 mg, Oral, 2 times daily   furosemide (LASIX) 40 mg, Oral, Every morning   Iron 325 mg, Oral, 2 times daily,     isosorbide dinitrate (ISORDIL)  20 mg, Oral, Daily PRN   metFORMIN (GLUCOPHAGE) 1,000 mg, Oral, 2 times daily with meals   metoprolol succinate (TOPROL-XL) 50 mg, Oral, Daily   nitroGLYCERIN (NITROSTAT) 0.3 mg, Sublingual, Every 5 min PRN   Semaglutide (OZEMPIC, 0.25 OR 0.5 MG/DOSE, [place]) 0.5 mg, Subcutaneous, Every Sun    REVIEW OF SYSTEMS: Review of Systems  Cardiovascular:  Positive for chest pain. Negative for claudication, dyspnea on exertion, irregular heartbeat, leg swelling, near-syncope, orthopnea, palpitations, paroxysmal nocturnal dyspnea and syncope.  Respiratory:  Negative for shortness of breath.   Hematologic/Lymphatic: Negative for bleeding problem.  Musculoskeletal:  Positive for back pain. Negative for muscle cramps and myalgias.       Left arm.   Neurological:  Negative for dizziness and light-headedness.    PHYSICAL EXAM:    07/25/2022   10:00 AM 07/25/2022    9:45 AM 07/25/2022    7:45 AM  Vitals with BMI  Systolic 563 875 643  Diastolic 90 80 72  Pulse 91 78 87    No intake or output data in the 24 hours ending 07/25/22 1220  Net IO Since Admission: No IO data has been entered for this period [07/25/22 1220]  CONSTITUTIONAL: Age-appropriate, obese, resting in bed comfortably, hemodynamically stable, no acute distress.  SKIN: Skin is warm and dry. No  rash noted. No cyanosis. No pallor. No jaundice HEAD: Normocephalic and atraumatic.  EYES: No scleral icterus MOUTH/THROAT: Moist oral membranes.  NECK: No JVD present. No thyromegaly noted. No carotid bruits  CHEST Normal respiratory effort. No intercostal retractions  LUNGS: Clear to auscultation bilaterally.  No stridor. No wheezes. No rales.  CARDIOVASCULAR: Regular rate and rhythm, positive S1-S2, no murmurs rubs or gallops appreciated ABDOMINAL: Obese, soft, nontender, nondistended, positive bowel sounds in all 4 quadrants, no apparent ascites.  EXTREMITIES: No pitting edema, warm to touch.  HEMATOLOGIC: No significant  bruising NEUROLOGIC: Oriented to person, place, and time. Nonfocal. Normal muscle tone.  PSYCHIATRIC: Normal mood and affect. Normal behavior. Cooperative  RADIOLOGY: ECHOCARDIOGRAM COMPLETE  Result Date: 07/25/2022    ECHOCARDIOGRAM REPORT   Patient Name:   [name] Date of Exam: 07/25/2022 Medical Rec #:  [id number]        Height:       61.0 in Accession #:    [id number]       Weight:       255.0 lb Date of Birth:  1966/01/10        BSA:          2.094 m Patient Age:    38 years         BP:           149/74 mmHg Patient Gender: F                HR:           83 bpm. Exam Location:  Inpatient Procedure: 2D Echo, Cardiac Doppler, Color Doppler and Intracardiac            Opacification Agent Indications:    Chest pain  History:        Patient has prior history of Echocardiogram examinations, most                 recent 03/10/2022. CHF, CAD; Risk Factors:Hypertension,                 Dyslipidemia and Diabetes.  Sonographer:    Memory Argue Referring Phys: [id number] RONDELL A SMITH IMPRESSIONS  1. Left ventricular ejection fraction, by estimation, is 60 to 65%. The left ventricle has normal function. The left ventricle has no regional wall motion abnormalities. There is mild concentric left ventricular hypertrophy. Left ventricular diastolic parameters are indeterminate.  2. Right ventricular systolic function is normal. The right ventricular size is normal. Tricuspid regurgitation signal is inadequate for assessing PA pressure.  3. The mitral valve is degenerative. No evidence of mitral valve regurgitation. No evidence of mitral stenosis.  4. The aortic valve is tricuspid. Aortic valve regurgitation is not visualized. Aortic valve sclerosis/calcification is present, without any evidence of aortic stenosis. Aortic valve area, by VTI measures 1.75 cm. Aortic valve mean gradient measures 6.0 mmHg. Aortic valve Vmax measures 1.71 m/s. FINDINGS  Left Ventricle: Left ventricular ejection fraction, by  estimation, is 60 to 65%. The left ventricle has normal function. The left ventricle has no regional wall motion abnormalities. The left ventricular internal cavity size was normal in size. There is  mild concentric left ventricular hypertrophy. Left ventricular diastolic parameters are indeterminate. Normal left ventricular filling pressure. Right Ventricle: The right ventricular size is normal. No increase in right ventricular wall thickness. Right ventricular systolic function is normal. Tricuspid regurgitation signal is inadequate for assessing PA pressure. Left Atrium: Left atrial size was normal in size. Right Atrium: Right atrial size  was normal in size. Pericardium: There is no evidence of pericardial effusion. Mitral Valve: The mitral valve is degenerative in appearance. There is mild calcification of the anterior mitral valve leaflet(s). Mild mitral annular calcification. No evidence of mitral valve regurgitation. No evidence of mitral valve stenosis. Tricuspid Valve: The tricuspid valve is normal in structure. Tricuspid valve regurgitation is not demonstrated. No evidence of tricuspid stenosis. Aortic Valve: The aortic valve is tricuspid. Aortic valve regurgitation is not visualized. Aortic valve sclerosis/calcification is present, without any evidence of aortic stenosis. Aortic valve mean gradient measures 6.0 mmHg. Aortic valve peak gradient measures 11.7 mmHg. Aortic valve area, by VTI measures 1.75 cm. Pulmonic Valve: The pulmonic valve was normal in structure. Pulmonic valve regurgitation is not visualized. No evidence of pulmonic stenosis. Aorta: The aortic root is normal in size and structure. Venous: The inferior vena cava was not well visualized. IAS/Shunts: No atrial level shunt detected by color flow Doppler.  LEFT VENTRICLE PLAX 2D LVIDd:         4.00 cm   Diastology LVIDs:         2.80 cm   LV e' medial:    6.99 cm/s LV PW:         1.20 cm   LV E/e' medial:  12.8 LV IVS:        1.30 cm   LV  e' lateral:   7.15 cm/s LVOT diam:     2.00 cm   LV E/e' lateral: 12.5 LV SV:         49 LV SV Index:   23 LVOT Area:     3.14 cm  RIGHT VENTRICLE RV S prime:     14.00 cm/s LEFT ATRIUM             Index        RIGHT ATRIUM          Index LA Vol (A2C):   65.5 ml 31.28 ml/m  RA Area:     9.95 cm LA Vol (A4C):   66.0 ml 31.52 ml/m  RA Volume:   20.40 ml 9.74 ml/m LA Biplane Vol: 66.9 ml 31.95 ml/m  AORTIC VALVE AV Area (Vmax):    1.68 cm AV Area (Vmean):   1.63 cm AV Area (VTI):     1.75 cm AV Vmax:           171.00 cm/s AV Vmean:          112.000 cm/s AV VTI:            0.278 m AV Peak Grad:      11.7 mmHg AV Mean Grad:      6.0 mmHg LVOT Vmax:         91.20 cm/s LVOT Vmean:        58.100 cm/s LVOT VTI:          0.155 m LVOT/AV VTI ratio: 0.56  AORTA Ao Root diam: 3.10 cm Ao Asc diam:  3.00 cm MITRAL VALVE MV Area (PHT): 4.15 cm    SHUNTS MV Decel Time: 183 msec    Systemic VTI:  0.16 m MV E velocity: 89.60 cm/s  Systemic Diam: 2.00 cm MV A velocity: 91.60 cm/s MV E/A ratio:  0.98 Fransico Him MD Electronically signed by Fransico Him MD Signature Date/Time: 07/25/2022/10:27:43 AM    Final    DG Chest 1 View  Result Date: 07/24/2022 CLINICAL DATA:  Back pain. EXAM: CHEST  1 VIEW COMPARISON:  April 02, 2022 FINDINGS:  The heart size and mediastinal contours are within normal limits. Both lungs are clear. There is mild dextroscoliosis of the lower thoracic spine. IMPRESSION: No active disease. Electronically Signed   By: Virgina Norfolk M.D.   On: 07/24/2022 18:31   DG Cervical Spine Complete  Result Date: 07/24/2022 CLINICAL DATA:  Left arm pain. EXAM: CERVICAL SPINE - COMPLETE 4+ VIEW COMPARISON:  None Available. FINDINGS: There is no evidence of cervical spine fracture or prevertebral soft tissue swelling. Alignment is normal. Mild to moderate severity anterior endplate sclerosis and mild anterior osteophyte formation are seen at the levels of C3-C4 and C5-C6. Mild to moderate severity intervertebral  disc space narrowing is also seen at C3-C4. Mild to moderate severity bilateral neural foraminal narrowing is noted at C3-C4. No other significant bone abnormalities are identified. IMPRESSION: Mild to moderate severity degenerative disc disease at C3-C4 and C5-C6. Electronically Signed   By: Virgina Norfolk M.D.   On: 07/24/2022 18:24    LABORATORY DATA: Lab Results  Component Value Date   WBC 6.4 07/24/2022   HGB 8.7 (L) 07/24/2022   HCT 27.6 (L) 07/24/2022   MCV 69.9 (L) 07/24/2022   PLT 346 07/24/2022    Recent Labs  Lab 07/24/22 1716  NA 138  K 3.8  CL 104  CO2 24  BUN 17  CREATININE 1.03*  CALCIUM 9.4  GLUCOSE 167*    Lipid Panel  Lab Results  Component Value Date   CHOL 143 05/27/2022   HDL 35 (L) 05/27/2022   LDLCALC 94 05/27/2022   TRIG 69 05/27/2022   CHOLHDL 4.1 05/27/2022    BNP (last 3 results) No results for input(s): "BNP" in the last 8760 hours.  HEMOGLOBIN A1C Lab Results  Component Value Date   HGBA1C 8.7 (H) 03/10/2022   MPG 202.99 03/10/2022    Cardiac Panel (last 3 results) Recent Labs    07/24/22 1716 07/25/22 0223  TROPONINIHS 5 5     TSH No results for input(s): "TSH" in the last 8760 hours.   CARDIAC DATABASE: EKG: 07/24/2022:  Sinus rhythm, 86 bpm, without underlying ischemia or injury pattern.  Sinus rhythm, 88 bpm, ST-T changes in inferolateral leads consider possible ischemia (present on prior EKGs).   07/25/2022: Normal sinus rhythm, 99 bpm, ST-T changes in inferolateral leads.  EKG 07/15/2022: Sinus rhythm 94 bpm  Nonspecific T-abnormality   EKG 06/12/2022: Sinus tachycardia 106 bpm  Nonspecific T-abnormality   Coronary intervention 05/26/2022: OM1: 90% complex lesion Intravascular ultrasound Successful PTCA OM1 with 2.0X15 Scoreflex and 2.0X6 mm [place] cutting balloons     Coronary intervention 03/10/2022:  LV: 168/80, EDP 19 mmHg.  Ao 196/90, mean 134 mmHg.  No pressure gradient across the aortic valve. LM:  Moderate caliber vessel.  No significant disease. LAD: Moderate caliber vessel throughout.  Mild diffuse disease.  Gives origin to several small diagonals, diagonal 2 and 3 which arises as bifurcation in the midsegment of the LAD have mild ostial disease.  Mid to distal segment of the LAD has a focal 40% stenosis. CX: Again a moderate caliber vessel giving origin to a very large territory distribution OM1, a 2.0 x 2.25 mm vessel with a high-grade ulcerated proximal tandem 95 to 99% stenosis.  OM 2 is very small caliber and again, moderate area of distribution, ostial 50% stenosis.  Distal circumflex has a 40% stenosis. RCA: The mid segment of the RCA has a stent from 2013 and a 2.75 x 23 mm Xience stent from 10/28/2019.  These  are overlapping.  The candy wrapper stenosis both at the inflow and outflow of the old stent from 2013 (proximal ISR, distal new lesion).  There is a 60 to 70% proximal stenosis starting from the proximal segment of the previously placed stent from 2021.   Interventional data: Difficult procedure due to poor visualization and resilient in-stent restenosis.  2.75 x 15 mm score flex balloon angioplasty at 20 atmospheric pressure followed by stenting with a 2.75 x 32 mm Synergy XT DES covering the entire in-stent restenosis to outflow stenosis of previously placed stent from 2013. Successful stenting of the proximal to mid RCA overlapping stent that was placed on 10/28/2019 and the distal edge with a 2.74 x 20 mm Synergy XD deployed at 16 atmospheric pressure.  The entire stented segment was postdilated to 20 atm pressure giving a 3 mm lumen.  Recommendation: Patient extremely high risk for recurrent restenosis and progression of coronary disease in view of uncontrolled hypertension, diabetes mellitus and noncompliance with medical advice.  Reiterate risks to the patient.  Aspirin indefinitely and Brilinta for at least 1 year in view of NSTEMI.  Patient will be scheduled for elective stage  intervention to a very large OM1 tomorrow if she remains stable through the right.  Although OM1 is 2.0-2.25 mm vessel, has a very large area of distribution and a high-grade ulcerated 99% stenosis.  She is at risk for developing recurrent ACS if untreated.  175 mL contrast utilized  IMPRESSION & RECOMMENDATIONS: [name] is a 56 y.o. African-American female whose past medical history and cardiovascular risk factors include: Hypertension, type 2 diabetes, CAD status post intervention, history of vaginal bleeding/anemia, obesity due to excess calories.  Impression:  Established coronary artery disease with angina pectoris History of coronary angioplasty and PCI. History of non-STEMI Hypertension Hyperlipidemia Non-insulin-dependent diabetes mellitus type 2 Anemia Obesity due to excess calories (BMI 48.2)  Plan:  Established coronary artery disease with angina pectoris: No active chest pain. But has been experiencing anginal discomfort. High sensitive troponin negative x2. EKG shows sinus rhythm with ST-T changes in inferolateral leads (noted on prior ECGs). Echo: Preserved LVEF without regional wall motion abnormalities. Proceed with exercise nuclear stress test (2-day protocol). Antianginal therapy: Beta-blockers, amlodipine. Add Imdur 15 mg p.o. every afternoon Better blood pressure management-we will add as losartan 25 mg p.o. every morning. Reemphasized importance of glycemic control (defer management to PCP, but will add Jardiance) Continue dual antiplatelet therapy.  History of NSTEMI/angioplasty/PCI: Patient ruled out for ACS. Initially recommended up titration of antianginal therapy and close outpatient follow-up/outpatient stress.  However, patient requested this to be done inpatient. See above  Hypertension: Not well controlled Start losartan and Jardiance as discussed above. Continue home medications Monitor for now  Hyperlipidemia: We will check fasting  lipid profile. Continue atorvastatin/Repatha  Non-insulin-dependent diabetes mellitus type 2: Management per primary team.  Added Jardiance.   Total encounter time 66 minutes. *Total Encounter Time as defined by the Centers for Medicare and Medicaid Services includes, in addition to the face-to-face time of a patient visit (documented in the note above) non-face-to-face time: obtaining and reviewing outside history, ordering and reviewing medications, tests or procedures, care coordination (communications with other health care professionals (ER physician Dr. Leonette Monarch, attending) and caregivers (Daughter) and documentation in the medical record.  Patient's questions and concerns were addressed to her satisfaction. She voices understanding of the instructions provided during this encounter.   This note was created using a voice recognition software as a result  there may be grammatical errors inadvertently enclosed that do not reflect the nature of this encounter. Every attempt is made to correct such errors.  Mechele Claude [facility]  Pager: [phone] Office: [phone] 07/25/2022, 12:20 PM

## 2022-07-25 NOTE — ED Provider Notes (Signed)
[facility] Provider Note  CSN: [id number] Arrival date & time: 07/24/22 1606  Chief Complaint(s) Back Pain and Arm Pain  HPI [name] is a 55 y.o. female with a past medical history listed below including CAD status post MI and stenting, prior heart failure with a last EF of 55 to 60% in May of this year, hypertension, hyperlipidemia, diabetes who presents from cardiac rehab for exertional back and left arm pain.  Patient has been having this pain for few months.  Typically exertional and resolves at rest.  She reports that usually she is able to perform 18 minutes of walking on a treadmill before having symptoms however today she began having symptoms after only a few minutes.  Her cardiologist was contacted by cardiac rehab who recommended she present to the emergency department for evaluation.  While walking from the waiting room to the treatment room, patient had exertional pain which now included her chest and radiated to her back, left arm and jaw.  She reported that this felt like her prior MI.  This lasted for several minutes and resolve with rest.  Currently she is chest pain-free.  The history is provided by the patient.    Past Medical History Past Medical History:  Diagnosis Date   Anemia    CAD (coronary artery disease)    PCI with stent to RCA in 2019   CHF (congestive heart failure) ([place]) 04/2011   EF 15-20% from echo 05/19/11   DOE (dyspnea on exertion)    DVT (deep venous thrombosis) ([place]) 06/2011   LLE   Fatigue    HTN (hypertension)    Hyperlipidemia    Menorrhagia    with iron deficient anemia   NSTEMI (non-ST elevated myocardial infarction) ([place]) 10/26/2017   NSTEMI (non-ST elevated myocardial infarction) ([place]) 10/27/2017   Obesity    Orthopnea    Type II diabetes mellitus ([place])    Patient Active Problem List   Diagnosis Date Noted   Chest pain 07/25/2022   Post PTCA 05/26/2022   Chronic iron deficiency  anemia 03/10/2022   Chronic diastolic CHF (congestive heart failure) ([place]) 03/10/2022   DM2 (diabetes mellitus, type 2) ([place]) 03/10/2022   Morbidly obese ([place]) 08/19/2020   Uncontrolled type 2 diabetes mellitus with hyperglycemia ([place]) 08/19/2020   Unstable angina ([place]) 11/16/2019   Non-ST elevation (NSTEMI) myocardial infarction ([place]) 10/27/2017   OSA (obstructive sleep apnea) 02/21/2013   Coronary artery disease of native artery of native heart with stable angina pectoris ([place]) 11/18/2011   Hyperlipidemia 11/18/2011   Precordial pain 11/17/2011   Abnormal cardiovascular stress test 11/17/2011   DVT (deep venous thrombosis) ([place]) 11/01/2011   Essential hypertension 11/01/2011   Blood loss anemia 11/01/2011   Home Medication(s) Prior to Admission medications   Medication Sig Start Date End Date Taking? Authorizing Provider  acetaminophen (TYLENOL) 325 MG tablet Take 650 mg by mouth every 6 (six) hours as needed (pain.).    [provider]  amLODipine (NORVASC) 5 MG tablet Take 1 tablet (5 mg total) by mouth daily. 05/27/22   Patwardhan, Reynold Bowen, MD  aspirin 81 MG chewable tablet Chew 81 mg by mouth in the morning.    [provider]  atorvastatin (LIPITOR) 40 MG tablet Take 1 tablet (40 mg total) by mouth every evening. 05/27/22   Patwardhan, Manish J, MD  Evolocumab (REPATHA SURECLICK) 017 MG/ML SOAJ Inject 140 mg into the skin every 14 (fourteen) days. 06/12/22   Patwardhan, [employer]  J, MD  Ferrous Sulfate (IRON) 325 (65 FE) MG TABS Take 325 mg by mouth 2 (two) times daily.    [provider]  furosemide (LASIX) 40 MG tablet Take 1 tablet (40 mg total) by mouth in the morning. 06/04/22   Patwardhan, Reynold Bowen, MD  glipiZIDE (GLUCOTROL XL) 5 MG 24 hr tablet Take 5 mg by mouth daily with breakfast. Patient not taking: Reported on 07/24/2022    [provider]  metFORMIN (GLUCOPHAGE) 1000 MG tablet Take 1,000 mg by mouth 2 (two) times daily with a meal.     [provider]  metoprolol tartrate (LOPRESSOR) 50 MG tablet Take 1 tablet (50 mg total) by mouth 2 (two) times daily. 06/12/22 06/07/23  Patwardhan, Reynold Bowen, MD  Semaglutide (OZEMPIC, 0.25 OR 0.5 MG/DOSE, [place]) Inject 0.5 mg into the skin every Sunday.    [provider]  ticagrelor (BRILINTA) 90 MG TABS tablet Take 1 tablet (90 mg total) by mouth 2 (two) times daily. 03/12/22   Shawna Clamp, MD                                                                                                                                    Allergies Detrol [tolterodine tartrate]  Review of Systems Review of Systems As noted in HPI  Physical Exam Vital Signs  I have reviewed the triage vital signs BP (!) 149/74 (BP Location: Left Arm)   Pulse 80   Temp 97.9 F (36.6 C) (Oral)   Resp 17   Ht '5\' 1"'$  (1.549 m)   Wt 115.7 kg   LMP 06/16/2019   SpO2 99%   BMI 48.18 kg/m   Physical Exam Vitals reviewed.  Constitutional:      General: She is not in acute distress.    Appearance: She is well-developed. She is obese. She is not diaphoretic.  HENT:     Head: Normocephalic and atraumatic.     Nose: Nose normal.  Eyes:     General: No scleral icterus.       Right eye: No discharge.        Left eye: No discharge.     Conjunctiva/sclera: Conjunctivae normal.     Pupils: Pupils are equal, round, and reactive to light.  Cardiovascular:     Rate and Rhythm: Normal rate and regular rhythm.     Heart sounds: No murmur heard.    No friction rub. No gallop.  Pulmonary:     Effort: Pulmonary effort is normal. No respiratory distress.     Breath sounds: Normal breath sounds. No stridor. No rales.  Abdominal:     General: There is no distension.     Palpations: Abdomen is soft.     Tenderness: There is no abdominal tenderness.  Musculoskeletal:        General: No tenderness.     Cervical back: Normal range of motion and neck supple.  Skin:  General: Skin is warm and dry.      Findings: No erythema or rash.  Neurological:     Mental Status: She is alert and oriented to person, place, and time.     ED Results and Treatments Labs (all labs ordered are listed, but only abnormal results are displayed) Labs Reviewed  BASIC METABOLIC PANEL - Abnormal; Notable for the following components:      Result Value   Glucose, Bld 167 (*)    Creatinine, Ser 1.03 (*)    All other components within normal limits  CBC - Abnormal; Notable for the following components:   Hemoglobin 8.7 (*)    HCT 27.6 (*)    MCV 69.9 (*)    MCH 22.0 (*)    RDW 15.9 (*)    All other components within normal limits  CBG MONITORING, ED  TROPONIN I (HIGH SENSITIVITY)  TROPONIN I (HIGH SENSITIVITY)                                                                                                                         EKG  EKG Interpretation  Date/Time:  Friday July 24 2022 17:39:56 EDT Ventricular Rate:  88 PR Interval:  148 QRS Duration: 90 QT Interval:  342 QTC Calculation: 413 R Axis:   29 Text Interpretation: Normal sinus rhythm T wave abnormality, consider inferior ischemia Abnormal ECG When compared with ECG of 24-Jul-2022 15:51, inferior changes new since last tracing Confirmed by Dorie Rank [phone]) on 07/24/2022 6:40:12 PM       Radiology DG Chest 1 View  Result Date: 07/24/2022 CLINICAL DATA:  Back pain. EXAM: CHEST  1 VIEW COMPARISON:  April 02, 2022 FINDINGS: The heart size and mediastinal contours are within normal limits. Both lungs are clear. There is mild dextroscoliosis of the lower thoracic spine. IMPRESSION: No active disease. Electronically Signed   By: Virgina Norfolk M.D.   On: 07/24/2022 18:31   DG Cervical Spine Complete  Result Date: 07/24/2022 CLINICAL DATA:  Left arm pain. EXAM: CERVICAL SPINE - COMPLETE 4+ VIEW COMPARISON:  None Available. FINDINGS: There is no evidence of cervical spine fracture or prevertebral soft tissue swelling. Alignment is normal.  Mild to moderate severity anterior endplate sclerosis and mild anterior osteophyte formation are seen at the levels of C3-C4 and C5-C6. Mild to moderate severity intervertebral disc space narrowing is also seen at C3-C4. Mild to moderate severity bilateral neural foraminal narrowing is noted at C3-C4. No other significant bone abnormalities are identified. IMPRESSION: Mild to moderate severity degenerative disc disease at C3-C4 and C5-C6. Electronically Signed   By: Virgina Norfolk M.D.   On: 07/24/2022 18:24    Medications Ordered in ED Medications  aspirin chewable tablet 324 mg (324 mg Oral Given 07/25/22 0332)  Procedures Procedures  (including critical care time)  Medical Decision Making / ED Course   Medical Decision Making Amount and/or Complexity of Data Reviewed External Data Reviewed: notes. Labs: ordered. Decision-making details documented in ED Course. Radiology: ordered and independent interpretation performed. Decision-making details documented in ED Course. ECG/medicine tests: ordered and independent interpretation performed. Decision-making details documented in ED Course.  Risk OTC drugs. Decision regarding hospitalization.    Patient presents with exertional symptoms concerning for angina. EKG with new T wave inversions in the inferior leads. Currently she is chest pain-free Aspirin given  Serial troponins were obtained and negative  Presentation was not classic for arctic dissection or esophageal perforation. Low concern for pulmonary embolism.  CBC without leukocytosis.  Stable hemoglobin.  Metabolic panel without significant electrolyte derangements.  Mild hyperglycemia without evidence of DKA.  Mild renal insufficiency without evidence of AKI.  Chest x-ray without evidence of pneumonia, pneumothorax or pulmonary  edema.  Consults like cardiology from who will coordinate for in-house stress testing and cath if needed. Medicine admitted.      Final Clinical Impression(s) / ED Diagnoses Final diagnoses:  Exertional chest pain           This chart was dictated using voice recognition software.  Despite best efforts to proofread,  errors can occur which can change the documentation meaning.    Fatima Blank, MD 07/25/22 [phone]

## 2022-07-25 NOTE — ED Notes (Signed)
Medications to be verified by pharmacy prior to this RN giving medications.

## 2022-07-25 NOTE — ED Notes (Signed)
Patient transported to [facility] 

## 2022-07-25 NOTE — ED Notes (Signed)
Pt ambulatory to bathroom. Denies chest pain at this time

## 2022-07-25 NOTE — H&P (Signed)
History and Physical    Patient: [name]:[id number] DOB: [birth year] DOA: 07/24/2022 DOS: the patient was seen and examined on 07/25/2022 PCP: [facility]  Patient coming from: Cardiac rehab  Chief Complaint:  Chief Complaint  Patient presents with   Back Pain   Arm Pain   HPI: [name] is a 56 y.o. female with medical history significant of hypertension, hyperlipidemia, CAD s/p stenting(RCA in 2013 and 2019), HFpEF, DVT in 2012 and no longer on anticoagulation, DM type II who presented from cardiac rehab with complaints of exertional arm and back pain.  Patient had underwent unsuccessful left heart cath 03/11/2022 after coming in for NSTEMI.  She followed up and had successful PTCA of 0M1 with Dr. Virgina Jock on 05/26/2022.  Patient reports that she has intermittently noted these pains in the middle of her back and left arm with any kind of exertion.  She had been taking Tylenol and symptoms seem to improve with rest.  Pains in her left arm still occur with certain movements.  She had been at cardiac rehab yesterday walking on the treadmill when she developed significant pain in her back and arm.  Noted associated symptoms of radiation into the left side of her neck, shortness of breath, and some mild left leg pain.  Denies having any significant leg swelling and does not sit for prolonged periods in time.  Note prior right lower extremity DVT back in 2012 for which patient was on Coumadin for some period in time, but currently is no longer on anticoagulation.  She has been able to lay flat at night.  Patient makes note that she had vaginal bleeding and had been sent to OB/GYN after being started on Brilinta back in April of this year.  Plan is for her to undergo some procedure, but her cardiologist recommends that she not be put to sleep for at least 6 months due to the recent cardiac intervention.  She has never had a colonoscopy before.  Her primary  care provider had set her up for Cologuard test, but she has not sent that in yet.  Upon admission to the emergency department patient was noted to be afebrile with mild tachypnea, blood pressure 139/109-149/74, and all other vital signs maintained.  EKG showed new T wave inversions in the inferior leads.  Labs noted hemoglobin 8.7 and slightly improved from prior, glucose 167, and high-sensitivity troponins negative x2.  Chest x-ray noted no acute abnormality.  X-rays of the cervical spine noted mild to moderate severe DDD at C3-4 and C5-6.  Patient had been given full dose aspirin.  Review of Systems: As mentioned in the history of present illness. All other systems reviewed and are negative. Past Medical History:  Diagnosis Date   Anemia    CAD (coronary artery disease)    PCI with stent to RCA in 2019   CHF (congestive heart failure) ([place]) 04/2011   EF 15-20% from echo 05/19/11   DOE (dyspnea on exertion)    DVT (deep venous thrombosis) ([place]) 06/2011   LLE   Fatigue    HTN (hypertension)    Hyperlipidemia    Menorrhagia    with iron deficient anemia   NSTEMI (non-ST elevated myocardial infarction) ([place]) 10/26/2017   NSTEMI (non-ST elevated myocardial infarction) ([place]) 10/27/2017   Obesity    Orthopnea    Type II diabetes mellitus ([place])    Past Surgical History:  Procedure Laterality Date   CORONARY ANGIOPLASTY WITH  STENT PLACEMENT  2013;  10/27/2017   CORONARY BALLOON ANGIOPLASTY N/A 05/26/2022   Procedure: CORONARY BALLOON ANGIOPLASTY;  Surgeon: [name], MD;  Location: [facility];  Service: Cardiovascular;  Laterality: N/A;   CORONARY STENT INTERVENTION N/A 10/27/2017   Procedure: CORONARY STENT INTERVENTION;  Surgeon: [name], MD;  Location: [facility];  Service: Cardiovascular;  Laterality: N/A;   CORONARY STENT INTERVENTION N/A 03/10/2022   Procedure: CORONARY STENT INTERVENTION;  Surgeon: [name], MD;  Location: [facility];   Service: Cardiovascular;  Laterality: N/A;   CORONARY STENT INTERVENTION N/A 03/11/2022   Procedure: CORONARY STENT INTERVENTION;  Surgeon: [name], MD;  Location: [facility];  Service: Cardiovascular;  Laterality: N/A;  aborted   INTRAVASCULAR ULTRASOUND/IVUS N/A 05/26/2022   Procedure: Intravascular Ultrasound/IVUS;  Surgeon: [name], MD;  Location: [facility];  Service: Cardiovascular;  Laterality: N/A;   LEFT HEART CATH AND CORONARY ANGIOGRAPHY N/A 10/27/2017   Procedure: LEFT HEART CATH AND CORONARY ANGIOGRAPHY;  Surgeon: [name], MD;  Location: [facility];  Service: Cardiovascular;  Laterality: N/A;   LEFT HEART CATH AND CORONARY ANGIOGRAPHY N/A 11/17/2019   Procedure: LEFT HEART CATH AND CORONARY ANGIOGRAPHY;  Surgeon: [name], MD;  Location: [facility];  Service: Cardiovascular;  Laterality: N/A;   LEFT HEART CATH AND CORONARY ANGIOGRAPHY N/A 03/10/2022   Procedure: LEFT HEART CATH AND CORONARY ANGIOGRAPHY;  Surgeon: [name], MD;  Location: [facility];  Service: Cardiovascular;  Laterality: N/A;   LEFT HEART CATHETERIZATION WITH CORONARY ANGIOGRAM N/A 11/17/2011   Procedure: LEFT HEART CATHETERIZATION WITH CORONARY ANGIOGRAM;  Surgeon: Laverda Page, MD;  Location: [facility];  Service: Cardiovascular;  Laterality: N/A;   OVARIAN CYST REMOVAL     TUBAL LIGATION  2006   ULTRASOUND GUIDANCE FOR VASCULAR ACCESS  10/27/2017   Procedure: Ultrasound Guidance For Vascular Access;  Surgeon: [name], MD;  Location: [facility];  Service: Cardiovascular;;   Social History:  reports that she quit smoking about 28 years ago. Her smoking use included cigarettes. She has a 2.50 pack-year smoking history. She has never used smokeless tobacco. She reports that she does not drink alcohol and does not use drugs.  Allergies  Allergen Reactions   Detrol [Tolterodine Tartrate] Rash    Family  History  Problem Relation Age of Onset   Hypertension Mother    Stroke Father    Heart attack Sister    Stroke Sister     Prior to Admission medications   Medication Sig Start Date End Date Taking? Authorizing Provider  acetaminophen (TYLENOL) 325 MG tablet Take 650 mg by mouth every 6 (six) hours as needed (pain.).    [provider]  amLODipine (NORVASC) 5 MG tablet Take 1 tablet (5 mg total) by mouth daily. 05/27/22   Patwardhan, Reynold Bowen, MD  aspirin 81 MG chewable tablet Chew 81 mg by mouth in the morning.    [provider]  atorvastatin (LIPITOR) 40 MG tablet Take 1 tablet (40 mg total) by mouth every evening. 05/27/22   Patwardhan, Manish J, MD  Evolocumab (REPATHA SURECLICK) 655 MG/ML SOAJ Inject 140 mg into the skin every 14 (fourteen) days. 06/12/22   Patwardhan, Reynold Bowen, MD  Ferrous Sulfate (IRON) 325 (65 FE) MG TABS Take 325 mg by mouth 2 (two) times daily.    [provider]  furosemide (LASIX) 40  MG tablet Take 1 tablet (40 mg total) by mouth in the morning. 06/04/22   Patwardhan, Reynold Bowen, MD  glipiZIDE (GLUCOTROL XL) 5 MG 24 hr tablet Take 5 mg by mouth daily with breakfast. Patient not taking: Reported on 07/24/2022    [provider]  metFORMIN (GLUCOPHAGE) 1000 MG tablet Take 1,000 mg by mouth 2 (two) times daily with a meal.    [provider]  metoprolol tartrate (LOPRESSOR) 50 MG tablet Take 1 tablet (50 mg total) by mouth 2 (two) times daily. 06/12/22 06/07/23  Patwardhan, Reynold Bowen, MD  Semaglutide (OZEMPIC, 0.25 OR 0.5 MG/DOSE, [place]) Inject 0.5 mg into the skin every Sunday.    [provider]  ticagrelor (BRILINTA) 90 MG TABS tablet Take 1 tablet (90 mg total) by mouth 2 (two) times daily. 03/12/22   Shawna Clamp, MD    Physical Exam: Vitals:   07/24/22 2038 07/24/22 2038 07/25/22 0042 07/25/22 0345  BP: (!) 141/75 (!) 141/75 (!) 149/74   Pulse: 78 78 79 80  Resp: 14 14 (!) 24 17  Temp: 98.3 F (36.8 C) 98.3 F  (36.8 C) 97.9 F (36.6 C)   TempSrc: Oral Oral Oral   SpO2:  100% 100% 99%  Weight:      Height:       Exam  Constitutional: Middle-aged female currently in no acute distress Eyes: PERRL, lids and conjunctivae normal ENMT: Mucous membranes are moist.   Neck: normal, supple  Respiratory: clear to auscultation bilaterally, no wheezing, no crackles. Normal respiratory effort. No accessory muscle use.  Cardiovascular: Regular rate and rhythm, no murmurs / rubs / gallops. No extremity edema. 2+ pedal pulses  Abdomen: no tenderness, no masses palpated.  Bowel sounds positive.  Musculoskeletal: no clubbing / cyanosis.  External rotation and raising left arm reproduces some pain symptoms. Skin: no rashes, lesions, ulcers. No induration Neurologic: CN 2-12 grossly intact. Sensation intact, DTR normal. Strength 5/5 in all 4.  Psychiatric: Normal judgment and insight. Alert and oriented x 3. Normal mood.   Data Reviewed:  EKG revealed sinus rhythm at 99 bpm T wave inversions in inferior leads.  Reviewed labs, imaging and pertinent records as noted above in HPI  Assessment and Plan: Chest pain/exertional arm and back pain CAD Acute.  Patient had been in cardiac rehab and reported complaints of exertional left arm, neck, and back pain.  High-sensitivity troponins negative x2, but EKG noting T wave inversions in the inferior leads.  Left arm pain is reproducible with certain arm movements.  CAD patient had just underwent  PTCA of 0M1 with Dr. Virgina Jock on 05/26/2022.  The differential includes cardiac cause, possibility of a blood clot given prior history of DVT, or possible musculoskeletal in nature. -Admit to a cardiac telemetry bed -Add on D-dimer -Check echocardiogram -Continue aspirin, Brilinta, beta-blocker, and statin -[name] cardiology consulted, will follow-up for any further recommendations  Chronic iron deficiency anemia Chronic.  Hemoglobin 8.7 with low MCV and MCH which appears  improved from prior.  Patient notes she has never had a colonoscopy and had been set up for Cologuard through PCP.  Suspect Cologuard test  will be positive -Add on iron studies -Recommend outpatient colonoscopy when medically able  Essential hypertension Blood pressures on admission elevated up to 159/72, but patient had not missed doses of home blood pressure medications. -Blood pressure regimen per cardiology -Hydralazine IV as needed for elevated blood pressure  Uncontrolled diabetes mellitus type 2 On admission glucose 167.  Last hemoglobin  A1c 8.7 on 5/23.  Home medication regimen includes metformin 1000 mg twice daily and Ozempic 0.5 mg q. Sunday. -Hypoglycemic protocols -Hold metformin -Jardiance started per cardiology -CBGs before every meal with sensitive SSI  Diastolic congestive heart failure Chronic.  Echocardiogram revealed EF to be 55 -60% with indeterminate diastolic parameters in May.  Patient does not appear grossly fluid overloaded on physical exam. -Continue to monitor  History of DVT Patient with prior history of right lower extremity DVT back in 2012 treated with Coumadin and stopped due to menorrhagia after being on the medication for 5 months.  Dyslipidemia Last lipid panel done on 05/27/2022 noted total cholesterol 143, HDL 35, LDL 94, and triglycerides 69.  Patient was unable to afford Repatha. -Continue atorvastatin -Goal LDL less than 70  Morbid obesity BMI 48.18 kg/m.  Patient is on [place] in outpatient setting   DVT prophylaxis: Lovenox Advance Care Planning:   Code Status: Full Code    Consults: Cardiology  Family Communication: Daughter updated at bedside  Severity of Illness: The appropriate patient status for this patient is OBSERVATION. Observation status is judged to be reasonable and necessary in order to provide the required intensity of service to ensure the patient's safety. The patient's presenting symptoms, physical  exam findings, and initial radiographic and laboratory data in the context of their medical condition is felt to place them at decreased risk for further clinical deterioration. Furthermore, it is anticipated that the patient will be medically stable for discharge from the hospital within 2 midnights of admission.   Author: Norval Morton, MD 07/25/2022 7:11 AM  For on call review [email or web address].

## 2022-07-25 NOTE — ED Notes (Signed)
ED TO INPATIENT HANDOFF REPORT  ED Nurse Name and Phone #: Terrilynn Postell RN [phone]  S Name/Age/Gender [name] 56 y.o. female Room/Bed: 002C/002C  Code Status   Code Status: Full Code  Home/SNF/Other Home Patient oriented to: self, place, time, and situation Is this baseline? Yes   Triage Complete: Triage complete  Chief Complaint Chest pain [R07.9]  Triage Note Patient states she was brought down from cardiac rehab c/o burning sensation to left arm and mid back pain radiating into right side of back. Pain was there prior to rehab but became more severe during. Was placed on 4L [place] during rehab and states has helped relieve the back pain. Denies any chest pain or shortness of breath.    Allergies Allergies  Allergen Reactions   Detrol [Tolterodine Tartrate] Rash    Level of Care/Admitting Diagnosis ED Disposition     ED Disposition  Admit   Condition  --   Comment  Hospital Area: [facility] [[id number]]  Level of Care: Telemetry Cardiac [[id number]]  May place patient in observation at [facility] or [place] if equivalent level of care is available:: No  Covid Evaluation: Asymptomatic - no recent exposure (last 10 days) testing not required  Diagnosis: Chest pain [665993]  Admitting Physician: Kayleen Memos [[id number]]  Attending Physician: Kayleen Memos [[id number]]          B Medical/Surgery History Past Medical History:  Diagnosis Date   Anemia    CAD (coronary artery disease)    PCI with stent to RCA in 2019   CHF (congestive heart failure) ([place]) 04/2011   EF 15-20% from echo 05/19/11   DOE (dyspnea on exertion)    DVT (deep venous thrombosis) ([place]) 06/2011   LLE   Fatigue    HTN (hypertension)    Hyperlipidemia    Menorrhagia    with iron deficient anemia   NSTEMI (non-ST elevated myocardial infarction) ([place]) 10/26/2017   NSTEMI (non-ST elevated myocardial infarction) ([place]) 10/27/2017   Obesity    Orthopnea    Type II diabetes  mellitus ([place])    Past Surgical History:  Procedure Laterality Date   CORONARY ANGIOPLASTY WITH STENT PLACEMENT  2013;  10/27/2017   CORONARY BALLOON ANGIOPLASTY N/A 05/26/2022   Procedure: CORONARY BALLOON ANGIOPLASTY;  Surgeon: [name], MD;  Location: [place] [facility];  Service: Cardiovascular;  Laterality: N/A;   CORONARY STENT INTERVENTION N/A 10/27/2017   Procedure: CORONARY STENT INTERVENTION;  Surgeon: [name], MD;  Location: [place] [facility];  Service: Cardiovascular;  Laterality: N/A;   CORONARY STENT INTERVENTION N/A 03/10/2022   Procedure: CORONARY STENT INTERVENTION;  Surgeon: [name], MD;  Location: [place] [facility];  Service: Cardiovascular;  Laterality: N/A;   CORONARY STENT INTERVENTION N/A 03/11/2022   Procedure: CORONARY STENT INTERVENTION;  Surgeon: [name], MD;  Location: [place] [facility];  Service: Cardiovascular;  Laterality: N/A;  aborted   INTRAVASCULAR ULTRASOUND/IVUS N/A 05/26/2022   Procedure: Intravascular Ultrasound/IVUS;  Surgeon: [name], MD;  Location: [place] [facility];  Service: Cardiovascular;  Laterality: N/A;   LEFT HEART CATH AND CORONARY ANGIOGRAPHY N/A 10/27/2017   Procedure: LEFT HEART CATH AND CORONARY ANGIOGRAPHY;  Surgeon: [name], MD;  Location: [place] [facility];  Service: Cardiovascular;  Laterality: N/A;   LEFT HEART CATH AND CORONARY ANGIOGRAPHY N/A 11/17/2019   Procedure: LEFT HEART CATH AND CORONARY ANGIOGRAPHY;  Surgeon: [name], MD;  Location: [place]  [facility];  Service: Cardiovascular;  Laterality: N/A;   LEFT HEART CATH AND CORONARY ANGIOGRAPHY N/A 03/10/2022   Procedure: LEFT HEART CATH AND CORONARY ANGIOGRAPHY;  Surgeon: [name], MD;  Location: [place];  Service: Cardiovascular;  Laterality: N/A;   LEFT HEART CATHETERIZATION WITH CORONARY ANGIOGRAM N/A 11/17/2011   Procedure: LEFT HEART CATHETERIZATION WITH CORONARY ANGIOGRAM;  Surgeon: Laverda Page, MD;  Location: [facility] CATH LAB;  Service: Cardiovascular;  Laterality: N/A;   OVARIAN CYST REMOVAL     TUBAL LIGATION  2006   ULTRASOUND GUIDANCE FOR VASCULAR ACCESS  10/27/2017   Procedure: Ultrasound Guidance For Vascular Access;  Surgeon: [name], MD;  Location: [place] [facility];  Service: Cardiovascular;;     A IV Location/Drains/Wounds Patient Lines/Drains/Airways Status     Active Line/Drains/Airways     Name Placement date Placement time Site Days   Peripheral IV 07/25/22 20 G Left Forearm 07/25/22  0230  Forearm  less than 1            Intake/Output Last 24 hours No intake or output data in the 24 hours ending 07/25/22 1539  Labs/Imaging Results for orders placed or performed during the hospital encounter of 07/24/22 (from the past 48 hour(s))  Basic metabolic panel     Status: Abnormal   Collection Time: 07/24/22  5:16 PM  Result Value Ref Range   Sodium 138 135 - 145 mmol/L   Potassium 3.8 3.5 - 5.1 mmol/L   Chloride 104 98 - 111 mmol/L   CO2 24 22 - 32 mmol/L   Glucose, Bld 167 (H) 70 - 99 mg/dL    Comment: Glucose reference range applies only to samples taken after fasting for at least 8 hours.   BUN 17 6 - 20 mg/dL   Creatinine, Ser 1.03 (H) 0.44 - 1.00 mg/dL   Calcium 9.4 8.9 - 10.3 mg/dL   GFR, Estimated >60 >60 mL/min    Comment: (NOTE) Calculated using the CKD-EPI Creatinine Equation (2021)    Anion gap 10 5 - 15    Comment: Performed at [facility] [address] [place], [place] [address]  Troponin I (High Sensitivity)     Status: None   Collection Time: 07/24/22  5:16 PM  Result Value Ref Range   Troponin I (High Sensitivity) 5 <18 ng/L    Comment: (NOTE) Elevated high sensitivity troponin I (hsTnI) values and significant  changes across serial measurements may suggest ACS but many other  chronic and acute conditions are known to elevate hsTnI results.  Refer to the "Links" section for chest pain algorithms and  additional  guidance. Performed at [facility], [place] [address] [place], [place] [address]   CBC     Status: Abnormal   Collection Time: 07/24/22  5:16 PM  Result Value Ref Range   WBC 6.4 4.0 - 10.5 K/uL   RBC 3.95 3.87 - 5.11 MIL/uL   Hemoglobin 8.7 (L) 12.0 - 15.0 g/dL    Comment: Reticulocyte Hemoglobin testing may be clinically indicated, consider ordering this additional test [id number]    HCT 27.6 (L) 36.0 - 46.0 %   MCV 69.9 (L) 80.0 - 100.0 fL   MCH 22.0 (L) 26.0 - 34.0 pg   MCHC 31.5 30.0 - 36.0 g/dL   RDW 15.9 (H) 11.5 - 15.5 %   Platelets 346 150 - 400 K/uL    Comment: REPEATED TO VERIFY   nRBC 0.0 0.0 - 0.2 %  Comment: Performed at [facility], [place] [address] [place], [place] [address]  Troponin I (High Sensitivity)     Status: None   Collection Time: 07/25/22  2:23 AM  Result Value Ref Range   Troponin I (High Sensitivity) 5 <18 ng/L    Comment: (NOTE) Elevated high sensitivity troponin I (hsTnI) values and significant  changes across serial measurements may suggest ACS but many other  chronic and acute conditions are known to elevate hsTnI results.  Refer to the "Links" section for chest pain algorithms and additional  guidance. Performed at [facility], [place] [address] [place], [place] [address]   HIV Antibody (routine testing w rflx)     Status: None   Collection Time: 07/25/22  9:18 AM  Result Value Ref Range   HIV Screen 4th Generation wRfx Non Reactive Non Reactive    Comment: Performed at [facility], [place] [address], [place], [place] [address]  Iron and TIBC     Status: Abnormal   Collection Time: 07/25/22  9:18 AM  Result Value Ref Range   Iron 29 28 - 170 ug/dL   TIBC 392 250 - 450 ug/dL   Saturation Ratios 7 (L) 10.4 - 31.8 %   UIBC 363 ug/dL    Comment: Performed at [facility], [place] [address] [place], [place] [address]  Ferritin     Status: Abnormal   Collection Time: 07/25/22  9:18 AM  Result Value  Ref Range   Ferritin 8 (L) 11 - 307 ng/mL    Comment: Performed at [facility], [place] [address] [place], [place] [address]  CBG monitoring, ED     Status: Abnormal   Collection Time: 07/25/22 12:35 PM  Result Value Ref Range   Glucose-Capillary 333 (H) 70 - 99 mg/dL    Comment: Glucose reference range applies only to samples taken after fasting for at least 8 hours.  CBG monitoring, ED     Status: Abnormal   Collection Time: 07/25/22  2:49 PM  Result Value Ref Range   Glucose-Capillary 293 (H) 70 - 99 mg/dL    Comment: Glucose reference range applies only to samples taken after fasting for at least 8 hours.   ECHOCARDIOGRAM COMPLETE  Result Date: 07/25/2022    ECHOCARDIOGRAM REPORT   Patient Name:   [name] Date of Exam: 07/25/2022 Medical Rec #:  [id number]        Height:       61.0 in Accession #:    [id number]       Weight:       255.0 lb Date of Birth:  1966/09/10        BSA:          2.094 m Patient Age:    33 years         BP:           149/74 mmHg Patient Gender: F                HR:           83 bpm. Exam Location:  Inpatient Procedure: 2D Echo, Cardiac Doppler, Color Doppler and Intracardiac            Opacification Agent Indications:    Chest pain  History:        Patient has prior history of Echocardiogram examinations, most  recent 03/10/2022. CHF, CAD; Risk Factors:Hypertension,                 Dyslipidemia and Diabetes.  Sonographer:    Memory Argue Referring Phys: [id number] RONDELL A SMITH IMPRESSIONS  1. Left ventricular ejection fraction, by estimation, is 60 to 65%. The left ventricle has normal function. The left ventricle has no regional wall motion abnormalities. There is mild concentric left ventricular hypertrophy. Left ventricular diastolic parameters are indeterminate.  2. Right ventricular systolic function is normal. The right ventricular size is normal. Tricuspid regurgitation signal is inadequate for assessing PA pressure.  3. The mitral valve  is degenerative. No evidence of mitral valve regurgitation. No evidence of mitral stenosis.  4. The aortic valve is tricuspid. Aortic valve regurgitation is not visualized. Aortic valve sclerosis/calcification is present, without any evidence of aortic stenosis. Aortic valve area, by VTI measures 1.75 cm. Aortic valve mean gradient measures 6.0 mmHg. Aortic valve Vmax measures 1.71 m/s. FINDINGS  Left Ventricle: Left ventricular ejection fraction, by estimation, is 60 to 65%. The left ventricle has normal function. The left ventricle has no regional wall motion abnormalities. The left ventricular internal cavity size was normal in size. There is  mild concentric left ventricular hypertrophy. Left ventricular diastolic parameters are indeterminate. Normal left ventricular filling pressure. Right Ventricle: The right ventricular size is normal. No increase in right ventricular wall thickness. Right ventricular systolic function is normal. Tricuspid regurgitation signal is inadequate for assessing PA pressure. Left Atrium: Left atrial size was normal in size. Right Atrium: Right atrial size was normal in size. Pericardium: There is no evidence of pericardial effusion. Mitral Valve: The mitral valve is degenerative in appearance. There is mild calcification of the anterior mitral valve leaflet(s). Mild mitral annular calcification. No evidence of mitral valve regurgitation. No evidence of mitral valve stenosis. Tricuspid Valve: The tricuspid valve is normal in structure. Tricuspid valve regurgitation is not demonstrated. No evidence of tricuspid stenosis. Aortic Valve: The aortic valve is tricuspid. Aortic valve regurgitation is not visualized. Aortic valve sclerosis/calcification is present, without any evidence of aortic stenosis. Aortic valve mean gradient measures 6.0 mmHg. Aortic valve peak gradient measures 11.7 mmHg. Aortic valve area, by VTI measures 1.75 cm. Pulmonic Valve: The pulmonic valve was normal in  structure. Pulmonic valve regurgitation is not visualized. No evidence of pulmonic stenosis. Aorta: The aortic root is normal in size and structure. Venous: The inferior vena cava was not well visualized. IAS/Shunts: No atrial level shunt detected by color flow Doppler.  LEFT VENTRICLE PLAX 2D LVIDd:         4.00 cm   Diastology LVIDs:         2.80 cm   LV e' medial:    6.99 cm/s LV PW:         1.20 cm   LV E/e' medial:  12.8 LV IVS:        1.30 cm   LV e' lateral:   7.15 cm/s LVOT diam:     2.00 cm   LV E/e' lateral: 12.5 LV SV:         49 LV SV Index:   23 LVOT Area:     3.14 cm  RIGHT VENTRICLE RV S prime:     14.00 cm/s LEFT ATRIUM             Index        RIGHT ATRIUM          Index LA Vol (A2C):  65.5 ml 31.28 ml/m  RA Area:     9.95 cm LA Vol (A4C):   66.0 ml 31.52 ml/m  RA Volume:   20.40 ml 9.74 ml/m LA Biplane Vol: 66.9 ml 31.95 ml/m  AORTIC VALVE AV Area (Vmax):    1.68 cm AV Area (Vmean):   1.63 cm AV Area (VTI):     1.75 cm AV Vmax:           171.00 cm/s AV Vmean:          112.000 cm/s AV VTI:            0.278 m AV Peak Grad:      11.7 mmHg AV Mean Grad:      6.0 mmHg LVOT Vmax:         91.20 cm/s LVOT Vmean:        58.100 cm/s LVOT VTI:          0.155 m LVOT/AV VTI ratio: 0.56  AORTA Ao Root diam: 3.10 cm Ao Asc diam:  3.00 cm MITRAL VALVE MV Area (PHT): 4.15 cm    SHUNTS MV Decel Time: 183 msec    Systemic VTI:  0.16 m MV E velocity: 89.60 cm/s  Systemic Diam: 2.00 cm MV A velocity: 91.60 cm/s MV E/A ratio:  0.98 Fransico Him MD Electronically signed by Fransico Him MD Signature Date/Time: 07/25/2022/10:27:43 AM    Final    DG Chest 1 View  Result Date: 07/24/2022 CLINICAL DATA:  Back pain. EXAM: CHEST  1 VIEW COMPARISON:  April 02, 2022 FINDINGS: The heart size and mediastinal contours are within normal limits. Both lungs are clear. There is mild dextroscoliosis of the lower thoracic spine. IMPRESSION: No active disease. Electronically Signed   By: Virgina Norfolk M.D.   On: 07/24/2022  18:31   DG Cervical Spine Complete  Result Date: 07/24/2022 CLINICAL DATA:  Left arm pain. EXAM: CERVICAL SPINE - COMPLETE 4+ VIEW COMPARISON:  None Available. FINDINGS: There is no evidence of cervical spine fracture or prevertebral soft tissue swelling. Alignment is normal. Mild to moderate severity anterior endplate sclerosis and mild anterior osteophyte formation are seen at the levels of C3-C4 and C5-C6. Mild to moderate severity intervertebral disc space narrowing is also seen at C3-C4. Mild to moderate severity bilateral neural foraminal narrowing is noted at C3-C4. No other significant bone abnormalities are identified. IMPRESSION: Mild to moderate severity degenerative disc disease at C3-C4 and C5-C6. Electronically Signed   By: Virgina Norfolk M.D.   On: 07/24/2022 18:24    Pending Labs Unresulted Labs (From admission, onward)     Start     Ordered   07/25/22 1115  D-dimer, quantitative  Add-on,   AD        07/25/22 1114   07/25/22 1100  hCG, serum, qualitative  Once,   R        07/25/22 1100   07/25/22 1100  Protime-INR  Once,   R        07/25/22 1100   07/25/22 1100  APTT  Once,   R        07/25/22 1100            Vitals/Pain Today's Vitals   07/25/22 1324 07/25/22 1326 07/25/22 1327 07/25/22 1338  BP: (!) 186/110 (!) 183/108 (!) 195/107 (!) 162/121  Pulse: (!) 130 (!) 124 (!) 122   Resp:      Temp:      TempSrc:      SpO2:  Weight:      Height:      PainSc:        Isolation Precautions No active isolations  Medications Medications  acetaminophen (TYLENOL) tablet 650 mg (has no administration in time range)  ondansetron (ZOFRAN) injection 4 mg (has no administration in time range)  enoxaparin (LOVENOX) injection 40 mg (40 mg Subcutaneous Given 07/25/22 0924)  0.9 %  sodium chloride infusion ( Intravenous New Bag/Given 07/25/22 0750)  alum & mag hydroxide-simeth (MAALOX/MYLANTA) 200-200-20 MG/5ML suspension 30 mL (has no administration in time range)   perflutren lipid microspheres (DEFINITY) IV suspension (1 mL Intravenous Given 07/25/22 0853)  LORazepam (ATIVAN) injection 0.5 mg (has no administration in time range)  aspirin chewable tablet 81 mg (has no administration in time range)  atorvastatin (LIPITOR) tablet 40 mg (has no administration in time range)  furosemide (LASIX) tablet 40 mg (40 mg Oral Given 07/25/22 1229)  metoprolol succinate (TOPROL-XL) 24 hr tablet 50 mg (50 mg Oral Given 07/25/22 1229)  ticagrelor (BRILINTA) tablet 90 mg (90 mg Oral Given 07/25/22 1230)  ferrous sulfate tablet 325 mg (has no administration in time range)  insulin aspart (novoLOG) injection 0-9 Units (5 Units Subcutaneous Given 07/25/22 1459)  losartan (COZAAR) tablet 25 mg (25 mg Oral Given 07/25/22 1231)  empagliflozin (JARDIANCE) tablet 10 mg (has no administration in time range)  isosorbide mononitrate (IMDUR) 24 hr tablet 15 mg (15 mg Oral Given 07/25/22 1230)  aspirin chewable tablet 324 mg (324 mg Oral Given 07/25/22 0332)  regadenoson (LEXISCAN) injection SOLN 0.4 mg (0.4 mg Intravenous Given 07/25/22 1323)  technetium tetrofosmin (TC-MYOVIEW) injection 25.6 millicurie (38.9 millicuries Intravenous Contrast Given 07/25/22 1325)  labetalol (NORMODYNE) injection 10 mg (10 mg Intravenous Given 07/25/22 1348)    Mobility walks Low fall risk   Focused Assessments Cardiac Assessment Handoff:    Lab Results  Component Value Date   CKTOTAL 105 11/01/2011   CKMB 1.6 11/01/2011   TROPONINI 0.11 ([place]) 10/28/2017   Lab Results  Component Value Date   DDIMER 1.14 (H) 07/17/2011   Does the Patient currently have chest pain? No    R Recommendations: See Admitting Provider Note  Report given to: Melvia Heaps   Additional Notes:

## 2022-07-26 ENCOUNTER — Observation Stay (HOSPITAL_COMMUNITY): Payer: Medicaid Other

## 2022-07-26 DIAGNOSIS — R7989 Other specified abnormal findings of blood chemistry: Secondary | ICD-10-CM | POA: Diagnosis not present

## 2022-07-26 DIAGNOSIS — Z7985 Long-term (current) use of injectable non-insulin antidiabetic drugs: Secondary | ICD-10-CM | POA: Diagnosis not present

## 2022-07-26 DIAGNOSIS — Z87891 Personal history of nicotine dependence: Secondary | ICD-10-CM | POA: Diagnosis not present

## 2022-07-26 DIAGNOSIS — Z823 Family history of stroke: Secondary | ICD-10-CM | POA: Diagnosis not present

## 2022-07-26 DIAGNOSIS — M79605 Pain in left leg: Secondary | ICD-10-CM | POA: Diagnosis present

## 2022-07-26 DIAGNOSIS — Z8249 Family history of ischemic heart disease and other diseases of the circulatory system: Secondary | ICD-10-CM | POA: Diagnosis not present

## 2022-07-26 DIAGNOSIS — Z79899 Other long term (current) drug therapy: Secondary | ICD-10-CM | POA: Diagnosis not present

## 2022-07-26 DIAGNOSIS — R079 Chest pain, unspecified: Secondary | ICD-10-CM | POA: Diagnosis present

## 2022-07-26 DIAGNOSIS — M79602 Pain in left arm: Secondary | ICD-10-CM | POA: Diagnosis present

## 2022-07-26 DIAGNOSIS — Z86718 Personal history of other venous thrombosis and embolism: Secondary | ICD-10-CM | POA: Diagnosis not present

## 2022-07-26 DIAGNOSIS — D509 Iron deficiency anemia, unspecified: Secondary | ICD-10-CM | POA: Diagnosis present

## 2022-07-26 DIAGNOSIS — E782 Mixed hyperlipidemia: Secondary | ICD-10-CM | POA: Diagnosis present

## 2022-07-26 DIAGNOSIS — M549 Dorsalgia, unspecified: Secondary | ICD-10-CM | POA: Diagnosis present

## 2022-07-26 DIAGNOSIS — R0682 Tachypnea, not elsewhere classified: Secondary | ICD-10-CM | POA: Diagnosis present

## 2022-07-26 DIAGNOSIS — Z955 Presence of coronary angioplasty implant and graft: Secondary | ICD-10-CM | POA: Diagnosis not present

## 2022-07-26 DIAGNOSIS — I25118 Atherosclerotic heart disease of native coronary artery with other forms of angina pectoris: Secondary | ICD-10-CM | POA: Diagnosis present

## 2022-07-26 DIAGNOSIS — I110 Hypertensive heart disease with heart failure: Secondary | ICD-10-CM | POA: Diagnosis present

## 2022-07-26 DIAGNOSIS — I252 Old myocardial infarction: Secondary | ICD-10-CM | POA: Diagnosis not present

## 2022-07-26 DIAGNOSIS — E1165 Type 2 diabetes mellitus with hyperglycemia: Secondary | ICD-10-CM | POA: Diagnosis present

## 2022-07-26 DIAGNOSIS — Z7902 Long term (current) use of antithrombotics/antiplatelets: Secondary | ICD-10-CM | POA: Diagnosis not present

## 2022-07-26 DIAGNOSIS — I5032 Chronic diastolic (congestive) heart failure: Secondary | ICD-10-CM | POA: Diagnosis present

## 2022-07-26 DIAGNOSIS — Z888 Allergy status to other drugs, medicaments and biological substances status: Secondary | ICD-10-CM | POA: Diagnosis not present

## 2022-07-26 DIAGNOSIS — Z7982 Long term (current) use of aspirin: Secondary | ICD-10-CM | POA: Diagnosis not present

## 2022-07-26 DIAGNOSIS — Z7984 Long term (current) use of oral hypoglycemic drugs: Secondary | ICD-10-CM | POA: Diagnosis not present

## 2022-07-26 DIAGNOSIS — Z6841 Body Mass Index (BMI) 40.0 and over, adult: Secondary | ICD-10-CM | POA: Diagnosis not present

## 2022-07-26 LAB — CBC WITH DIFFERENTIAL/PLATELET
Abs Immature Granulocytes: 0.01 10*3/uL (ref 0.00–0.07)
Basophils Absolute: 0 10*3/uL (ref 0.0–0.1)
Basophils Relative: 0 %
Eosinophils Absolute: 0.1 10*3/uL (ref 0.0–0.5)
Eosinophils Relative: 1 %
HCT: 28 % — ABNORMAL LOW (ref 36.0–46.0)
Hemoglobin: 9 g/dL — ABNORMAL LOW (ref 12.0–15.0)
Immature Granulocytes: 0 %
Lymphocytes Relative: 20 %
Lymphs Abs: 1 10*3/uL (ref 0.7–4.0)
MCH: 22.2 pg — ABNORMAL LOW (ref 26.0–34.0)
MCHC: 32.1 g/dL (ref 30.0–36.0)
MCV: 69.1 fL — ABNORMAL LOW (ref 80.0–100.0)
Monocytes Absolute: 0.4 10*3/uL (ref 0.1–1.0)
Monocytes Relative: 7 %
Neutro Abs: 3.7 10*3/uL (ref 1.7–7.7)
Neutrophils Relative %: 72 %
Platelets: 316 10*3/uL (ref 150–400)
RBC: 4.05 MIL/uL (ref 3.87–5.11)
RDW: 16 % — ABNORMAL HIGH (ref 11.5–15.5)
WBC: 5.1 10*3/uL (ref 4.0–10.5)
nRBC: 0 % (ref 0.0–0.2)

## 2022-07-26 LAB — COMPREHENSIVE METABOLIC PANEL
ALT: 16 U/L (ref 0–44)
AST: 17 U/L (ref 15–41)
Albumin: 3.5 g/dL (ref 3.5–5.0)
Alkaline Phosphatase: 94 U/L (ref 38–126)
Anion gap: 12 (ref 5–15)
BUN: 12 mg/dL (ref 6–20)
CO2: 23 mmol/L (ref 22–32)
Calcium: 9.3 mg/dL (ref 8.9–10.3)
Chloride: 105 mmol/L (ref 98–111)
Creatinine, Ser: 0.95 mg/dL (ref 0.44–1.00)
GFR, Estimated: >60 mL/min (ref >60)
Glucose, Bld: 148 mg/dL — ABNORMAL HIGH (ref 70–99)
Potassium: 3.9 mmol/L (ref 3.5–5.1)
Sodium: 140 mmol/L (ref 135–145)
Total Bilirubin: 0.4 mg/dL (ref 0.3–1.2)
Total Protein: 7.1 g/dL (ref 6.5–8.1)

## 2022-07-26 LAB — GLUCOSE, CAPILLARY
Glucose-Capillary: 221 mg/dL — ABNORMAL HIGH (ref 70–99)
Glucose-Capillary: 131 mg/dL — ABNORMAL HIGH (ref 70–99)
Glucose-Capillary: 244 mg/dL — ABNORMAL HIGH (ref 70–99)
Glucose-Capillary: 211 mg/dL — ABNORMAL HIGH (ref 70–99)

## 2022-07-26 MED ORDER — SODIUM CHLORIDE 0.9 % IV SOLN: 250.0000 mL | INTRAVENOUS | Status: DC | PRN

## 2022-07-26 MED ORDER — FERROUS SULFATE 325 (65 FE) MG PO TABS
325.0000 mg | ORAL_TABLET | ORAL | Status: DC
  Administered 2022-07-26 – 2022-07-28 (×2): 325 mg via ORAL
  Filled 2022-07-26 (×2): qty 1

## 2022-07-26 MED ORDER — SODIUM CHLORIDE 0.9% FLUSH
3.0000 mL | Freq: Two times a day (BID) | INTRAVENOUS | Status: DC
  Administered 2022-07-27 – 2022-07-28 (×2): 3 mL via INTRAVENOUS

## 2022-07-26 MED ORDER — SODIUM CHLORIDE 0.9 % IV SOLN: INTRAVENOUS | Status: DC

## 2022-07-26 MED ORDER — METOPROLOL SUCCINATE ER 100 MG PO TB24
100.0000 mg | ORAL_TABLET | Freq: Every day | ORAL | Status: DC
  Administered 2022-07-27 – 2022-07-28 (×2): 100 mg via ORAL
  Filled 2022-07-26 (×2): qty 1

## 2022-07-26 MED ORDER — INSULIN ASPART 100 UNIT/ML IJ SOLN
0.0000 [IU] | Freq: Every day | INTRAMUSCULAR | Status: DC
  Administered 2022-07-27: 2 [IU] via SUBCUTANEOUS

## 2022-07-26 MED ORDER — SODIUM CHLORIDE 0.9 % IV SOLN
250.0000 mg | Freq: Every day | INTRAVENOUS | Status: AC
  Administered 2022-07-26 – 2022-07-27 (×2): 250 mg via INTRAVENOUS
  Filled 2022-07-26 (×2): qty 20

## 2022-07-26 MED ORDER — SODIUM CHLORIDE 0.9% FLUSH: 3.0000 mL | INTRAVENOUS | Status: DC | PRN

## 2022-07-26 MED ORDER — TECHNETIUM TC 99M TETROFOSMIN IV KIT
31.3000 | PACK | Freq: Once | INTRAVENOUS | Status: AC | PRN
  Administered 2022-07-26: 31.3 via INTRAVENOUS

## 2022-07-26 NOTE — Progress Notes (Signed)
Progress Note  Patient Name: [name] Date of Encounter: 07/26/2022  Attending physician: Elodia Florence., * Primary care provider: [facility] Primary Cardiologist: Dr. Vernell Leep  Subjective: [name] is a 56 y.o. female who was seen and examined at bedside  No active chest pain. No family at bedside. Case discussed and reviewed with her nurse.  Objective: Vital Signs in the last 24 hours: Temp:  [97.8 F (36.6 C)-98.6 F (37 C)] 98.2 F (36.8 C) (10/08 0904) Pulse Rate:  [76-89] 89 (10/08 0904) Cardiac Rhythm: Sinus tachycardia (10/08 0701) Resp:  [16-17] 17 (10/08 0904) BP: (129-140)/(63-74) 129/72 (10/08 0904) SpO2:  [100 %] 100 % (10/07 2037)  Intake/Output:  Intake/Output Summary (Last 24 hours) at 07/26/2022 1740 Last data filed at 07/26/2022 1524 Gross per 24 hour  Intake 940.45 ml  Output 750 ml  Net 190.45 ml    Net IO Since Admission: 430.45 mL [07/26/22 1740]  Weights:  Filed Weights   07/24/22 1645 07/25/22 1630  Weight: 115.7 kg 114.4 kg    Telemetry: Personally reviewed.  Normal sinus without significant ectopy  Physical examination: PHYSICAL EXAM:    07/26/2022    9:04 AM 07/26/2022    1:00 AM 07/25/2022    8:37 PM  Vitals with BMI  Systolic 010 932 355  Diastolic 72 63 74  Pulse 89 76 82    General: Age-appropriate, obese, resting in bed comfortably, hemodynamically stable, no acute distress. HEENT: Normocephalic, atraumatic, no JVP, no carotid bruits, moist mucous membranes. Lungs: Clear to auscultation bilaterally.  No wheezes rales or rhonchi's. Heart: Regular, positive S1-S2, no murmurs rubs or gallops appreciated. Abdomen: Obese, positive bowel sounds in all 4 quadrants, nontender, nondistended Extremities: No pitting edema bilaterally, warm to touch. Neuro: Cranial 2-12 are grossly intact. Psych: Appropriate affect, behavior, cooperative  Lab  Results: Hematology Recent Labs  Lab 07/24/22 1716 07/26/22 0824  WBC 6.4 5.1  RBC 3.95 4.05  HGB 8.7* 9.0*  HCT 27.6* 28.0*  MCV 69.9* 69.1*  MCH 22.0* 22.2*  MCHC 31.5 32.1  RDW 15.9* 16.0*  PLT 346 316    Chemistry Recent Labs  Lab 07/24/22 1716 07/26/22 0824  NA 138 140  K 3.8 3.9  CL 104 105  CO2 24 23  GLUCOSE 167* 148*  BUN 17 12  CREATININE 1.03* 0.95  CALCIUM 9.4 9.3  PROT  --  7.1  ALBUMIN  --  3.5  AST  --  17  ALT  --  16  ALKPHOS  --  94  BILITOT  --  0.4  GFRNONAA >60 >60  ANIONGAP 10 12     Cardiac Enzymes: Cardiac Panel (last 3 results) Recent Labs    07/24/22 1716 07/25/22 0223  TROPONINIHS 5 5    BNP (last 3 results) No results for input(s): "BNP" in the last 8760 hours.  ProBNP (last 3 results) No results for input(s): "PROBNP" in the last 8760 hours.   DDimer  Recent Labs  Lab 07/25/22 1645  DDIMER 0.71*     Hemoglobin A1c:  Lab Results  Component Value Date   HGBA1C 8.7 (H) 03/10/2022   MPG 202.99 03/10/2022    TSH No results for input(s): "TSH" in the last 8760 hours.  Lipid Panel     Component Value Date/Time   CHOL 143 05/27/2022 0320   TRIG 69 05/27/2022 0320   HDL 35 (L) 05/27/2022 0320   CHOLHDL 4.1 05/27/2022 0320  VLDL 14 05/27/2022 0320   LDLCALC 94 05/27/2022 0320    Imaging: NM Myocar Multi W/Spect W/Wall Motion / EF  Result Date: 07/26/2022 CLINICAL DATA:  Chest pain. Shortness of breath. Intermediate coronary artery disease risk. EXAM: MYOCARDIAL IMAGING WITH SPECT (REST AND PHARMACOLOGIC-STRESS - 2 DAY PROTOCOL) GATED LEFT VENTRICULAR WALL MOTION STUDY LEFT VENTRICULAR EJECTION FRACTION TECHNIQUE: Standard myocardial SPECT imaging was performed after resting intravenous injection of 31.3 mCi Tc-64mtetrofosmin. Subsequently, on a second day, intravenous infusion of Lexiscan was performed under the supervision of the Cardiology staff. At peak effect of the drug, 31.1 mCi Tc-965metrofosmin was  injected intravenously and standard myocardial SPECT imaging was performed. Quantitative gated imaging was also performed to evaluate left ventricular wall motion, and estimate left ventricular ejection fraction. COMPARISON:  None Available. FINDINGS: Perfusion: Large reversible myocardial perfusion defect seen throughout the anterior, apical, and lateral walls, consistent with myocardial ischemia. Wall Motion: Anterior and lateral wall hypokinesis noted. No left ventricular dilation. Left Ventricular Ejection Fraction: 58 % End diastolic volume 90 ml End systolic volume 37 ml IMPRESSION: 1. Large reversible myocardial perfusion defect throughout the anterior, apical, and lateral walls of the left ventricle, consistent with myocardial ischemia. 2. Mild apical and lateral wall hypokinesis. 3. Left ventricular ejection fraction 58% 4. Non invasive risk stratification*: High *2012 Appropriate Use Criteria for Coronary Revascularization Focused Update: J Am Coll Cardiol. 202440;10(2):725-366http://content.[email or web address]?articleid=[email or web address] Electronically Signed   By: JoMarlaine Hind.D.   On: 07/26/2022 12:06   ECHOCARDIOGRAM COMPLETE  Result Date: 07/25/2022    ECHOCARDIOGRAM REPORT   Patient Name:   Courtney DELAHUNTate of Exam: 07/25/2022 Medical Rec #:  [id number]      Height:       61.0 in Accession #:    [id number]     Weight:       255.0 lb Date of Birth:  12/1965-07-31      BSA:          2.094 m Patient Age:    [age]         BP:           149/74 mmHg Patient Gender: F                HR:           83 bpm. Exam Location:  Inpatient Procedure: 2D Echo, Cardiac Doppler, Color Doppler and Intracardiac            Opacification Agent Indications:    Chest pain  History:        Patient has prior history of Echocardiogram examinations, most                 recent 03/10/2022. CHF, CAD; Risk Factors:Hypertension,                 Dyslipidemia and Diabetes.  Sonographer:    ElMemory Argueeferring Phys:  [id number] A SMITH IMPRESSIONS  1. Left ventricular ejection fraction, by estimation, is 60 to 65%. The left ventricle has normal function. The left ventricle has no regional wall motion abnormalities. There is mild concentric left ventricular hypertrophy. Left ventricular diastolic parameters are indeterminate.  2. Right ventricular systolic function is normal. The right ventricular size is normal. Tricuspid regurgitation signal is inadequate for assessing PA pressure.  3. The mitral valve is degenerative. No evidence of mitral valve regurgitation. No evidence of mitral stenosis.  4. The aortic valve is tricuspid. Aortic  valve regurgitation is not visualized. Aortic valve sclerosis/calcification is present, without any evidence of aortic stenosis. Aortic valve area, by VTI measures 1.75 cm. Aortic valve mean gradient measures 6.0 mmHg. Aortic valve Vmax measures 1.71 m/s. FINDINGS  Left Ventricle: Left ventricular ejection fraction, by estimation, is 60 to 65%. The left ventricle has normal function. The left ventricle has no regional wall motion abnormalities. The left ventricular internal cavity size was normal in size. There is  mild concentric left ventricular hypertrophy. Left ventricular diastolic parameters are indeterminate. Normal left ventricular filling pressure. Right Ventricle: The right ventricular size is normal. No increase in right ventricular wall thickness. Right ventricular systolic function is normal. Tricuspid regurgitation signal is inadequate for assessing PA pressure. Left Atrium: Left atrial size was normal in size. Right Atrium: Right atrial size was normal in size. Pericardium: There is no evidence of pericardial effusion. Mitral Valve: The mitral valve is degenerative in appearance. There is mild calcification of the anterior mitral valve leaflet(s). Mild mitral annular calcification. No evidence of mitral valve regurgitation. No evidence of mitral valve stenosis. Tricuspid Valve:  The tricuspid valve is normal in structure. Tricuspid valve regurgitation is not demonstrated. No evidence of tricuspid stenosis. Aortic Valve: The aortic valve is tricuspid. Aortic valve regurgitation is not visualized. Aortic valve sclerosis/calcification is present, without any evidence of aortic stenosis. Aortic valve mean gradient measures 6.0 mmHg. Aortic valve peak gradient measures 11.7 mmHg. Aortic valve area, by VTI measures 1.75 cm. Pulmonic Valve: The pulmonic valve was normal in structure. Pulmonic valve regurgitation is not visualized. No evidence of pulmonic stenosis. Aorta: The aortic root is normal in size and structure. Venous: The inferior vena cava was not well visualized. IAS/Shunts: No atrial level shunt detected by color flow Doppler.  LEFT VENTRICLE PLAX 2D LVIDd:         4.00 cm   Diastology LVIDs:         2.80 cm   LV e' medial:    6.99 cm/s LV PW:         1.20 cm   LV E/e' medial:  12.8 LV IVS:        1.30 cm   LV e' lateral:   7.15 cm/s LVOT diam:     2.00 cm   LV E/e' lateral: 12.5 LV SV:         49 LV SV Index:   23 LVOT Area:     3.14 cm  RIGHT VENTRICLE RV S prime:     14.00 cm/s LEFT ATRIUM             Index        RIGHT ATRIUM          Index LA Vol (A2C):   65.5 ml 31.28 ml/m  RA Area:     9.95 cm LA Vol (A4C):   66.0 ml 31.52 ml/m  RA Volume:   20.40 ml 9.74 ml/m LA Biplane Vol: 66.9 ml 31.95 ml/m  AORTIC VALVE AV Area (Vmax):    1.68 cm AV Area (Vmean):   1.63 cm AV Area (VTI):     1.75 cm AV Vmax:           171.00 cm/s AV Vmean:          112.000 cm/s AV VTI:            0.278 m AV Peak Grad:      11.7 mmHg AV Mean Grad:      6.0 mmHg LVOT Vmax:  91.20 cm/s LVOT Vmean:        58.100 cm/s LVOT VTI:          0.155 m LVOT/AV VTI ratio: 0.56  AORTA Ao Root diam: 3.10 cm Ao Asc diam:  3.00 cm MITRAL VALVE MV Area (PHT): 4.15 cm    SHUNTS MV Decel Time: 183 msec    Systemic VTI:  0.16 m MV E velocity: 89.60 cm/s  Systemic Diam: 2.00 cm MV A velocity: 91.60 cm/s MV E/A  ratio:  0.98 Fransico Him MD Electronically signed by Fransico Him MD Signature Date/Time: 07/25/2022/10:27:43 AM    Final    DG Chest 1 View  Result Date: 07/24/2022 CLINICAL DATA:  Back pain. EXAM: CHEST  1 VIEW COMPARISON:  April 02, 2022 FINDINGS: The heart size and mediastinal contours are within normal limits. Both lungs are clear. There is mild dextroscoliosis of the lower thoracic spine. IMPRESSION: No active disease. Electronically Signed   By: Virgina Norfolk M.D.   On: 07/24/2022 18:31   DG Cervical Spine Complete  Result Date: 07/24/2022 CLINICAL DATA:  Left arm pain. EXAM: CERVICAL SPINE - COMPLETE 4+ VIEW COMPARISON:  None Available. FINDINGS: There is no evidence of cervical spine fracture or prevertebral soft tissue swelling. Alignment is normal. Mild to moderate severity anterior endplate sclerosis and mild anterior osteophyte formation are seen at the levels of C3-C4 and C5-C6. Mild to moderate severity intervertebral disc space narrowing is also seen at C3-C4. Mild to moderate severity bilateral neural foraminal narrowing is noted at C3-C4. No other significant bone abnormalities are identified. IMPRESSION: Mild to moderate severity degenerative disc disease at C3-C4 and C5-C6. Electronically Signed   By: Virgina Norfolk M.D.   On: 07/24/2022 18:24    CARDIAC DATABASE: EKG: 07/24/2022:  Sinus rhythm, 86 bpm, without underlying ischemia or injury pattern.   Sinus rhythm, 88 bpm, ST-[name] changes in inferolateral leads consider possible ischemia (present on prior EKGs).    07/25/2022: Normal sinus rhythm, 99 bpm, ST-[name] changes in inferolateral leads.   EKG 07/15/2022: Sinus rhythm 94 bpm  Nonspecific [name]-abnormality   EKG 06/12/2022: Sinus tachycardia 106 bpm  Nonspecific [name]-abnormality  Echocardiogram: 07/25/2022: LVEF 60-65%, mild LVH, indeterminate diastolic function, normal right ventricular size and function, no significant valvular heart disease.  Stress  test: Pharmacological stress test 2-day protocol: 07/25/2022 1. Large reversible myocardial perfusion defect throughout the anterior, apical, and lateral walls of the left ventricle, consistent with myocardial ischemia.   2. Mild apical and lateral wall hypokinesis.   3. Left ventricular ejection fraction 58%   4. Non invasive risk stratification*: High  Heart catheterization: Coronary intervention 05/26/2022: OM1: 90% complex lesion Intravascular ultrasound Successful PTCA OM1 with 2.0X15 Scoreflex and 2.0X6 mm [place]ine cutting balloons     Coronary intervention 03/10/2022:  LV: 168/80, EDP 19 mmHg.  Ao 196/90, mean 134 mmHg.  No pressure gradient across the aortic valve. LM: Moderate caliber vessel.  No significant disease. LAD: Moderate caliber vessel throughout.  Mild diffuse disease.  Gives origin to several small diagonals, diagonal 2 and 3 which arises as bifurcation in the midsegment of the LAD have mild ostial disease.  Mid to distal segment of the LAD has a focal 40% stenosis. CX: Again a moderate caliber vessel giving origin to a very large territory distribution OM1, a 2.0 x 2.25 mm vessel with a high-grade ulcerated proximal tandem 95 to 99% stenosis.  OM 2 is very small caliber and again, moderate area of distribution, ostial 50% stenosis.  Distal  circumflex has a 40% stenosis. RCA: The mid segment of the RCA has a stent from 2013 and a 2.75 x 23 mm Xience stent from 10/28/2019.  These are overlapping.  The candy wrapper stenosis both at the inflow and outflow of the old stent from 2013 (proximal ISR, distal new lesion).  There is a 60 to 70% proximal stenosis starting from the proximal segment of the previously placed stent from 2021.   Interventional data: Difficult procedure due to poor visualization and resilient in-stent restenosis.  2.75 x 15 mm score flex balloon angioplasty at 20 atmospheric pressure followed by stenting with a 2.75 x 32 mm Synergy XT DES covering the entire  in-stent restenosis to outflow stenosis of previously placed stent from 2013. Successful stenting of the proximal to mid RCA overlapping stent that was placed on 10/28/2019 and the distal edge with a 2.74 x 20 mm Synergy XD deployed at 16 atmospheric pressure.  The entire stented segment was postdilated to 20 atm pressure giving a 3 mm lumen.  Recommendation: Patient extremely high risk for recurrent restenosis and progression of coronary disease in view of uncontrolled hypertension, diabetes mellitus and noncompliance with medical advice.  Reiterate risks to the patient.  Aspirin indefinitely and Brilinta for at least 1 year in view of NSTEMI.  Patient will be scheduled for elective stage intervention to a very large OM1 tomorrow if she remains stable through the right.  Although OM1 is 2.0-2.25 mm vessel, has a very large area of distribution and a high-grade ulcerated 99% stenosis.  She is at risk for developing recurrent ACS if untreated.  175 mL contrast utilized  Scheduled Meds:  amLODipine  10 mg Oral QPM   aspirin  81 mg Oral q AM   atorvastatin  40 mg Oral QPM   empagliflozin  10 mg Oral Daily   enoxaparin (LOVENOX) injection  40 mg Subcutaneous Q24H   ferrous sulfate  325 mg Oral QODAY   insulin aspart  0-9 Units Subcutaneous TID WC   isosorbide mononitrate  30 mg Oral Daily   losartan  25 mg Oral Daily   metoprolol succinate  50 mg Oral Daily   ticagrelor  90 mg Oral BID    Continuous Infusions:  ferric gluconate (FERRLECIT) IVPB 135 mL/hr at 07/26/22 1524    PRN Meds: acetaminophen, alum & mag hydroxide-simeth, LORazepam, ondansetron (ZOFRAN) IV   IMPRESSION & RECOMMENDATIONS: CALLAN YONTZ is a 56 y.o. African-American female whose past medical history and cardiac risk factors include: Hypertension, type 2 diabetes, CAD status post intervention, history of vaginal bleeding/anemia, obesity due to excess calories.  Impression: Established coronary artery disease with angina  pectoris (on arrival) with prior angioplasty and stenting History of NSTEMI May 2023. Abnormal nuclear stress test. Hypertension. Dyslipidemia. Non-insulin-dependent diabetes mellitus type 2. Anemia. Obesity due to excess calories  Plan: Established coronary artery disease with angina pectoris (on arrival) with prior angioplasty and stenting /abnormal nuclear stress test/history of NSTEMI Presents with exertional chest pain.  Currently asymptomatic. High sensitive troponins negative x2. ECG shows sinus rhythm with ST-[name] changes in inferolateral leads (noted on prior ECGs). Echo: Preserved LVEF, no regional wall motion abnormalities, no significant valvular heart disease. 2-day MPI stress test: Chest pain during stress protocol, ST depressions during stress, perfusion notes reversible defect suggestive of possible ischemia in the LAD/LCx distribution Antianginal therapy: Toprol-XL, Norvasc, Imdur Increase Toprol-XL to 100 mg p.o. daily starting tomorrow.   The procedure of left heart catheterization with possible intervention was explained  to the patient in detail.  The indication, alternatives, risks and benefits were reviewed.  Complications include but not limited to bleeding, infection, vascular injury, stroke, myocardial infarction, arrhythmia (requiring medical or cardiopulmonary resuscitation), kidney injury (requiring short-term or long-term hemodialysis), radiation-related injury in the case of prolonged fluoroscopy use, emergent cardiac surgery, temporary or permanent pacemaker, and death. The patient understands the risks of serious complication is 1-2 in 6438 with diagnostic cardiac cath and 1-2% or less with angioplasty/stenting.  The patient voices understanding and provides verbal feedback her questions and concerns are addressed to her satisfaction and patient wishes to proceed with coronary angiography with possible PCI.  Hypertension: Better control on current  regimen.  Dyslipidemia: Continue statin therapy.  Check lipid profile  Non-insulin-dependent diabetes mellitus type 2:  Reemphasized importance of glycemic control. Started on Jardiance 10 mg p.o. daily and losartan during this hospitalization. Patient states that she was started on Jardiance 25 mg as outpatient and developed 1 urinary tract infection and later discontinued. Given her cardiovascular comorbidities recommend retrial of SGLT2 inhibitors at a lower dose.  Patient is agreeable with the plan of care.  She will be more cognizant of UTI symptoms.  Recommendations conveyed to nursing staff as well as attending physician.  Total time spent: 39 minutes.  Patient's questions and concerns were addressed to her satisfaction. She voices understanding of the instructions provided during this encounter.   This note was created using a voice recognition software as a result there may be grammatical errors inadvertently enclosed that do not reflect the nature of this encounter. Every attempt is made to correct such errors.  Mechele Claude [facility]  Pager: [phone] Office: [phone] 07/26/2022, 5:40 PM

## 2022-07-26 NOTE — Progress Notes (Signed)
[facility] night cross cover note:   I was contacted by RN w/ request for qhs sliding scale insulin coverage for this patient with qhs cbg of 211. No report of preceding hypoglycemia. I placed corresponding order for qhs ssi coverage.      Babs Bertin, DO Hospitalist

## 2022-07-26 NOTE — Progress Notes (Signed)
PROGRESS NOTE    [name]  HDQ:[id number] DOB: 01-21-1966 DOA: 07/24/2022 PCP: [facility]  Chief Complaint  Patient presents with   Back Pain   Arm Pain    Brief Narrative:  [name] is [name] 56 y.o. female with medical history significant of hypertension, hyperlipidemia, CAD s/p stenting(RCA in 2013 and 2019), HFpEF, DVT in 2012 and no longer on anticoagulation, DM type II who presented from cardiac rehab with complaints of exertional arm and back pain.  Patient had underwent unsuccessful left heart cath 03/11/2022 after coming in for NSTEMI.  She followed up and had successful PTCA of 0M1 with Dr. Virgina [name] on 05/26/2022.  Patient reports that she has intermittently noted these pains in the middle of her back and left arm with any kind of exertion.  She had been taking Tylenol and symptoms seem to improve with rest.  Pains in her left arm still occur with certain movements.  She had been at cardiac rehab yesterday walking on the treadmill when she developed significant pain in her back and arm.  Noted associated symptoms of radiation into the left side of her neck, shortness of breath, and some mild left leg pain.  Denies having any significant leg swelling and does not sit for prolonged periods in time.  Note prior right lower extremity DVT back in 2012 for which patient was on Coumadin for some period in time, but currently is no longer on anticoagulation.  She has been able to lay flat at night.  Patient makes note that she had vaginal bleeding and had been sent to OB/GYN after being started on Brilinta back in April of this year.  Plan is for her to undergo some procedure, but her cardiologist recommends that she not be put to sleep for at least 6 months due to the recent cardiac intervention.  She has never had Lamine Laton colonoscopy before.  Her primary care provider had set her up for Cologuard test, but she has not sent that in yet.   Upon admission to the  emergency department patient was noted to be afebrile with mild tachypnea, blood pressure 139/109-149/74, and all other vital signs maintained.  EKG showed new T wave inversions in the inferior leads.  Labs noted hemoglobin 8.7 and slightly improved from prior, glucose 167, and high-sensitivity troponins negative x2.  Chest x-ray noted no acute abnormality.  X-rays of the cervical spine noted mild to moderate severe DDD at C3-4 and C5-6.  Patient had been given full dose aspirin.   Assessment & Plan:   Principal Problem:   Chest pain Active Problems:   Coronary artery disease of native artery of native heart with stable angina pectoris (HCC)   Chronic iron deficiency anemia   Essential hypertension   Uncontrolled type 2 diabetes mellitus with hyperglycemia (HCC)   Chronic diastolic CHF (congestive heart failure) (HCC)   Dyslipidemia   Morbidly obese (HCC)   Assessment and Plan: Chest pain/exertional arm and back pain CAD Acute.  Patient had been in cardiac rehab and reported complaints of exertional left arm, neck, and back pain.  High-sensitivity troponins negative x2, but EKG noting T wave inversions in the inferior leads.  Left arm pain is reproducible with certain arm movements.  CAD patient had just underwent  PTCA of 0M1 with Dr. Virgina [name] on 05/26/2022.  The differential includes cardiac cause, possibility of [name] blood clot given prior history of DVT, or possible musculoskeletal in nature. -Admit to [name] cardiac  telemetry bed -Add on D-dimer -> follow LE [place] first (she's not having any CP or SOB at this time) -Check echocardiogram -> EF 60-65%, no RWMA - Stress test with large reversible myocardial perfusion defect throughout anterior, apical, and lateral walls of L ventrical, mild apical and lateral wall hypokinesis -> high risk stress test, planning for catheterization per cards tomorrow (NPO after breakfast) -Continue aspirin, Brilinta, beta-blocker, and statin -[name] cardiology  consulted, will follow-up for any further recommendations   Chronic iron deficiency anemia Chronic.  Hemoglobin 8.7 with low MCV and MCH which appears improved from prior.  Patient notes she has never had [name] colonoscopy and had been set up for Cologuard through PCP.  - Severe iron def anemia - IV iron - needs outpatient workup including colonoscopy   Essential hypertension Blood pressures on admission elevated up to 159/72, but patient had not missed doses of home blood pressure medications. - amlodipine, cozaar, norvasc, metoprolol, imdur per cards -Blood pressure regimen per cardiology -Hydralazine IV as needed for elevated blood pressure   Uncontrolled diabetes mellitus type 2 On admission glucose 167.  Last hemoglobin A1c 8.7 on 5/23.  Home medication regimen includes metformin 1000 mg twice daily and Ozempic 0.5 mg q. Sunday. -Hold metformin -Jardiance started per cardiology -CBGs before every meal with sensitive SSI   Diastolic congestive heart failure Chronic.  Echocardiogram revealed EF to be 55 -60% with indeterminate diastolic parameters in May.  Patient does not appear grossly fluid overloaded on physical exam. -Continue to monitor   History of DVT Patient with prior history of right lower extremity DVT back in 2012 treated with Coumadin and stopped due to menorrhagia after being on the medication for 5 months.   Dyslipidemia Last lipid panel done on 05/27/2022 noted total cholesterol 143, HDL 35, LDL 94, and triglycerides 69.  Patient was unable to afford Repatha. -Continue atorvastatin -Goal LDL less than 70   Morbid obesity BMI 48.18 kg/m.  Patient is on [place] in outpatient setting     DVT prophylaxis: lovenox Code Status: full Family Communication: none Disposition:   Status is: Observation The patient will require care spanning > 2 midnights and should be moved to inpatient because: need for stress   Consultants:   cardiology  Procedures:  none  Antimicrobials:  Anti-infectives (From admission, onward)    None       Subjective: No new complaints Denies chest pain  Objective: Vitals:   07/25/22 1630 07/25/22 2037 07/26/22 0100 07/26/22 0904  BP: (!) 150/92 (!) 140/74 130/63 129/72  Pulse: 86 82 76 89  Resp: '16 17 16 17  '$ Temp: 97.8 F (36.6 C) 98.6 F (37 C) 97.8 F (36.6 C) 98.2 F (36.8 C)  TempSrc: Oral Oral Oral Oral  SpO2: 100% 100%    Weight: 114.4 kg     Height: '5\' 2"'$  (1.575 m)       Intake/Output Summary (Last 24 hours) at 07/26/2022 1646 Last data filed at 07/26/2022 1524 Gross per 24 hour  Intake 940.45 ml  Output 750 ml  Net 190.45 ml   Filed Weights   07/24/22 1645 07/25/22 1630  Weight: 115.7 kg 114.4 kg    Examination:  General exam: Appears calm and comfortable  Respiratory system: unlabored Cardiovascular system: RRR Gastrointestinal system: Abdomen is nondistended, soft and nontender. Central nervous system: Alert and oriented. No focal neurological deficits. Extremities: no LEE   Data Reviewed: I have personally reviewed following labs and imaging studies  CBC: Recent Labs  Lab 07/24/22 1716 07/26/22 0824  WBC 6.4 5.1  NEUTROABS  --  3.7  HGB 8.7* 9.0*  HCT 27.6* 28.0*  MCV 69.9* 69.1*  PLT 346 096    Basic Metabolic Panel: Recent Labs  Lab 07/24/22 1716 07/26/22 0824  NA 138 140  K 3.8 3.9  CL 104 105  CO2 24 23  GLUCOSE 167* 148*  BUN 17 12  CREATININE 1.03* 0.95  CALCIUM 9.4 9.3    GFR: Estimated Creatinine Clearance: 81 mL/min (by C-G formula based on SCr of 0.95 mg/dL).  Liver Function Tests: Recent Labs  Lab 07/26/22 0824  AST 17  ALT 16  ALKPHOS 94  BILITOT 0.4  PROT 7.1  ALBUMIN 3.5    CBG: Recent Labs  Lab 07/25/22 1235 07/25/22 1449 07/25/22 1644 07/25/22 2132 07/26/22 1315  GLUCAP 333* 293* 214* 141* 221*     No results found for this or any previous visit (from the past 240 hour(s)).        Radiology Studies: NM Myocar Multi W/Spect W/Wall Motion / EF  Result Date: 07/26/2022 CLINICAL DATA:  Chest pain. Shortness of breath. Intermediate coronary artery disease risk. EXAM: MYOCARDIAL IMAGING WITH SPECT (REST AND PHARMACOLOGIC-STRESS - 2 DAY PROTOCOL) GATED LEFT VENTRICULAR WALL MOTION STUDY LEFT VENTRICULAR EJECTION FRACTION TECHNIQUE: Standard myocardial SPECT imaging was performed after resting intravenous injection of 31.3 mCi Tc-56mtetrofosmin. Subsequently, on Jenisha Faison second day, intravenous infusion of Lexiscan was performed under the supervision of the Cardiology staff. At peak effect of the drug, 31.1 mCi Tc-931metrofosmin was injected intravenously and standard myocardial SPECT imaging was performed. Quantitative gated imaging was also performed to evaluate left ventricular wall motion, and estimate left ventricular ejection fraction. COMPARISON:  None Available. FINDINGS: Perfusion: Large reversible myocardial perfusion defect seen throughout the anterior, apical, and lateral walls, consistent with myocardial ischemia. Wall Motion: Anterior and lateral wall hypokinesis noted. No left ventricular dilation. Left Ventricular Ejection Fraction: 58 % End diastolic volume 90 ml End systolic volume 37 ml IMPRESSION: 1. Large reversible myocardial perfusion defect throughout the anterior, apical, and lateral walls of the left ventricle, consistent with myocardial ischemia. 2. Mild apical and lateral wall hypokinesis. 3. Left ventricular ejection fraction 58% 4. Non invasive risk stratification*: High *2012 Appropriate Use Criteria for Coronary Revascularization Focused Update: J Am Coll Cardiol. 202836;62(9):476-546http://content.onairportbarriers.comspx?articleid=[email or web address] Electronically Signed   By: JoMarlaine Hind.D.   On: 07/26/2022 12:06   ECHOCARDIOGRAM COMPLETE  Result Date: 07/25/2022    ECHOCARDIOGRAM REPORT   Patient Name:   Courtney LOUGHMILLERate of Exam: 07/25/2022 Medical Rec  #:  [id number]      Height:       61.0 in Accession #:    [id number]     Weight:       255.0 lb Date of Birth:  12/19/08/1967      BSA:          2.094 m Patient Age:    [age]         BP:           149/74 mmHg Patient Gender: F                HR:           83 bpm. Exam Location:  Inpatient Procedure: 2D Echo, Cardiac Doppler, Color Doppler and Intracardiac            Opacification Agent Indications:    Chest pain  History:        Patient has prior history of Echocardiogram examinations, most                 recent 03/10/2022. CHF, CAD; Risk Factors:Hypertension,                 Dyslipidemia and Diabetes.  Sonographer:    Memory Argue Referring Phys: [id number] RONDELL Cristyn Crossno SMITH IMPRESSIONS  1. Left ventricular ejection fraction, by estimation, is 60 to 65%. The left ventricle has normal function. The left ventricle has no regional wall motion abnormalities. There is mild concentric left ventricular hypertrophy. Left ventricular diastolic parameters are indeterminate.  2. Right ventricular systolic function is normal. The right ventricular size is normal. Tricuspid regurgitation signal is inadequate for assessing PA pressure.  3. The mitral valve is degenerative. No evidence of mitral valve regurgitation. No evidence of mitral stenosis.  4. The aortic valve is tricuspid. Aortic valve regurgitation is not visualized. Aortic valve sclerosis/calcification is present, without any evidence of aortic stenosis. Aortic valve area, by VTI measures 1.75 cm. Aortic valve mean gradient measures 6.0 mmHg. Aortic valve Vmax measures 1.71 m/s. FINDINGS  Left Ventricle: Left ventricular ejection fraction, by estimation, is 60 to 65%. The left ventricle has normal function. The left ventricle has no regional wall motion abnormalities. The left ventricular internal cavity size was normal in size. There is  mild concentric left ventricular hypertrophy. Left ventricular diastolic parameters are indeterminate. Normal left ventricular  filling pressure. Right Ventricle: The right ventricular size is normal. No increase in right ventricular wall thickness. Right ventricular systolic function is normal. Tricuspid regurgitation signal is inadequate for assessing PA pressure. Left Atrium: Left atrial size was normal in size. Right Atrium: Right atrial size was normal in size. Pericardium: There is no evidence of pericardial effusion. Mitral Valve: The mitral valve is degenerative in appearance. There is mild calcification of the anterior mitral valve leaflet(s). Mild mitral annular calcification. No evidence of mitral valve regurgitation. No evidence of mitral valve stenosis. Tricuspid Valve: The tricuspid valve is normal in structure. Tricuspid valve regurgitation is not demonstrated. No evidence of tricuspid stenosis. Aortic Valve: The aortic valve is tricuspid. Aortic valve regurgitation is not visualized. Aortic valve sclerosis/calcification is present, without any evidence of aortic stenosis. Aortic valve mean gradient measures 6.0 mmHg. Aortic valve peak gradient measures 11.7 mmHg. Aortic valve area, by VTI measures 1.75 cm. Pulmonic Valve: The pulmonic valve was normal in structure. Pulmonic valve regurgitation is not visualized. No evidence of pulmonic stenosis. Aorta: The aortic root is normal in size and structure. Venous: The inferior vena cava was not well visualized. IAS/Shunts: No atrial level shunt detected by color flow Doppler.  LEFT VENTRICLE PLAX 2D LVIDd:         4.00 cm   Diastology LVIDs:         2.80 cm   LV e' medial:    6.99 cm/s LV PW:         1.20 cm   LV E/e' medial:  12.8 LV IVS:        1.30 cm   LV e' lateral:   7.15 cm/s LVOT diam:     2.00 cm   LV E/e' lateral: 12.5 LV SV:         49 LV SV Index:   23 LVOT Area:     3.14 cm  RIGHT VENTRICLE RV S prime:     14.00 cm/s LEFT ATRIUM  Index        RIGHT ATRIUM          Index LA Vol (A2C):   65.5 ml 31.28 ml/m  RA Area:     9.95 cm LA Vol (A4C):   66.0 ml  31.52 ml/m  RA Volume:   20.40 ml 9.74 ml/m LA Biplane Vol: 66.9 ml 31.95 ml/m  AORTIC VALVE AV Area (Vmax):    1.68 cm AV Area (Vmean):   1.63 cm AV Area (VTI):     1.75 cm AV Vmax:           171.00 cm/s AV Vmean:          112.000 cm/s AV VTI:            0.278 m AV Peak Grad:      11.7 mmHg AV Mean Grad:      6.0 mmHg LVOT Vmax:         91.20 cm/s LVOT Vmean:        58.100 cm/s LVOT VTI:          0.155 m LVOT/AV VTI ratio: 0.56  AORTA Ao Root diam: 3.10 cm Ao Asc diam:  3.00 cm MITRAL VALVE MV Area (PHT): 4.15 cm    SHUNTS MV Decel Time: 183 msec    Systemic VTI:  0.16 m MV E velocity: 89.60 cm/s  Systemic Diam: 2.00 cm MV Joanne Salah velocity: 91.60 cm/s MV E/Maresha Anastos ratio:  0.98 Fransico Him MD Electronically signed by Fransico Him MD Signature Date/Time: 07/25/2022/10:27:43 AM    Final    DG Chest 1 View  Result Date: 07/24/2022 CLINICAL DATA:  Back pain. EXAM: CHEST  1 VIEW COMPARISON:  April 02, 2022 FINDINGS: The heart size and mediastinal contours are within normal limits. Both lungs are clear. There is mild dextroscoliosis of the lower thoracic spine. IMPRESSION: No active disease. Electronically Signed   By: Courtney Norfolk M.D.   On: 07/24/2022 18:31   DG Cervical Spine Complete  Result Date: 07/24/2022 CLINICAL DATA:  Left arm pain. EXAM: CERVICAL SPINE - COMPLETE 4+ VIEW COMPARISON:  None Available. FINDINGS: There is no evidence of cervical spine fracture or prevertebral soft tissue swelling. Alignment is normal. Mild to moderate severity anterior endplate sclerosis and mild anterior osteophyte formation are seen at the levels of C3-C4 and C5-C6. Mild to moderate severity intervertebral disc space narrowing is also seen at C3-C4. Mild to moderate severity bilateral neural foraminal narrowing is noted at C3-C4. No other significant bone abnormalities are identified. IMPRESSION: Mild to moderate severity degenerative disc disease at C3-C4 and C5-C6. Electronically Signed   By: Courtney Norfolk M.D.   On:  07/24/2022 18:24        Scheduled Meds:  amLODipine  10 mg Oral QPM   aspirin  81 mg Oral q AM   atorvastatin  40 mg Oral QPM   empagliflozin  10 mg Oral Daily   enoxaparin (LOVENOX) injection  40 mg Subcutaneous Q24H   ferrous sulfate  325 mg Oral QODAY   insulin aspart  0-9 Units Subcutaneous TID WC   isosorbide mononitrate  30 mg Oral Daily   losartan  25 mg Oral Daily   metoprolol succinate  50 mg Oral Daily   ticagrelor  90 mg Oral BID   Continuous Infusions:  ferric gluconate (FERRLECIT) IVPB 135 mL/hr at 07/26/22 1524     LOS: 0 days    Time spent: over 30 min    Fayrene Helper, MD [facility]  To contact the attending provider between 7A-7P or the covering provider during after hours 7P-7A, please log into the web site [email or web address] and access using universal [employer] password for that web site. If you do not have the password, please call the hospital operator.  07/26/2022, 4:46 PM

## 2022-07-26 NOTE — Plan of Care (Signed)

## 2022-07-26 NOTE — H&P (View-Only) (Signed)
Progress Note  Patient Name: [name] Date of Encounter: 07/26/2022  Attending physician: Elodia Florence., * Primary care provider: [facility] Primary Cardiologist: Dr. Vernell Leep  Subjective: [name] is a 56 y.o. female who was seen and examined at bedside  No active chest pain. No family at bedside. Case discussed and reviewed with her nurse.  Objective: Vital Signs in the last 24 hours: Temp:  [97.8 F (36.6 C)-98.6 F (37 C)] 98.2 F (36.8 C) (10/08 0904) Pulse Rate:  [76-89] 89 (10/08 0904) Cardiac Rhythm: Sinus tachycardia (10/08 0701) Resp:  [16-17] 17 (10/08 0904) BP: (129-140)/(63-74) 129/72 (10/08 0904) SpO2:  [100 %] 100 % (10/07 2037)  Intake/Output:  Intake/Output Summary (Last 24 hours) at 07/26/2022 1740 Last data filed at 07/26/2022 1524 Gross per 24 hour  Intake 940.45 ml  Output 750 ml  Net 190.45 ml    Net IO Since Admission: 430.45 mL [07/26/22 1740]  Weights:  Filed Weights   07/24/22 1645 07/25/22 1630  Weight: 115.7 kg 114.4 kg    Telemetry: Personally reviewed.  Normal sinus without significant ectopy  Physical examination: PHYSICAL EXAM:    07/26/2022    9:04 AM 07/26/2022    1:00 AM 07/25/2022    8:37 PM  Vitals with BMI  Systolic 010 932 355  Diastolic 72 63 74  Pulse 89 76 82    General: Age-appropriate, obese, resting in bed comfortably, hemodynamically stable, no acute distress. HEENT: Normocephalic, atraumatic, no JVP, no carotid bruits, moist mucous membranes. Lungs: Clear to auscultation bilaterally.  No wheezes rales or rhonchi's. Heart: Regular, positive S1-S2, no murmurs rubs or gallops appreciated. Abdomen: Obese, positive bowel sounds in all 4 quadrants, nontender, nondistended Extremities: No pitting edema bilaterally, warm to touch. Neuro: Cranial 2-12 are grossly intact. Psych: Appropriate affect, behavior, cooperative  Lab  Results: Hematology Recent Labs  Lab 07/24/22 1716 07/26/22 0824  WBC 6.4 5.1  RBC 3.95 4.05  HGB 8.7* 9.0*  HCT 27.6* 28.0*  MCV 69.9* 69.1*  MCH 22.0* 22.2*  MCHC 31.5 32.1  RDW 15.9* 16.0*  PLT 346 316    Chemistry Recent Labs  Lab 07/24/22 1716 07/26/22 0824  NA 138 140  K 3.8 3.9  CL 104 105  CO2 24 23  GLUCOSE 167* 148*  BUN 17 12  CREATININE 1.03* 0.95  CALCIUM 9.4 9.3  PROT  --  7.1  ALBUMIN  --  3.5  AST  --  17  ALT  --  16  ALKPHOS  --  94  BILITOT  --  0.4  GFRNONAA >60 >60  ANIONGAP 10 12     Cardiac Enzymes: Cardiac Panel (last 3 results) Recent Labs    07/24/22 1716 07/25/22 0223  TROPONINIHS 5 5    BNP (last 3 results) No results for input(s): "BNP" in the last 8760 hours.  ProBNP (last 3 results) No results for input(s): "PROBNP" in the last 8760 hours.   DDimer  Recent Labs  Lab 07/25/22 1645  DDIMER 0.71*     Hemoglobin A1c:  Lab Results  Component Value Date   HGBA1C 8.7 (H) 03/10/2022   MPG 202.99 03/10/2022    TSH No results for input(s): "TSH" in the last 8760 hours.  Lipid Panel     Component Value Date/Time   CHOL 143 05/27/2022 0320   TRIG 69 05/27/2022 0320   HDL 35 (L) 05/27/2022 0320   CHOLHDL 4.1 05/27/2022 0320  VLDL 14 05/27/2022 0320   LDLCALC 94 05/27/2022 0320    Imaging: NM Myocar Multi W/Spect W/Wall Motion / EF  Result Date: 07/26/2022 CLINICAL DATA:  Chest pain. Shortness of breath. Intermediate coronary artery disease risk. EXAM: MYOCARDIAL IMAGING WITH SPECT (REST AND PHARMACOLOGIC-STRESS - 2 DAY PROTOCOL) GATED LEFT VENTRICULAR WALL MOTION STUDY LEFT VENTRICULAR EJECTION FRACTION TECHNIQUE: Standard myocardial SPECT imaging was performed after resting intravenous injection of 31.3 mCi Tc-64mtetrofosmin. Subsequently, on a second day, intravenous infusion of Lexiscan was performed under the supervision of the Cardiology staff. At peak effect of the drug, 31.1 mCi Tc-965metrofosmin was  injected intravenously and standard myocardial SPECT imaging was performed. Quantitative gated imaging was also performed to evaluate left ventricular wall motion, and estimate left ventricular ejection fraction. COMPARISON:  None Available. FINDINGS: Perfusion: Large reversible myocardial perfusion defect seen throughout the anterior, apical, and lateral walls, consistent with myocardial ischemia. Wall Motion: Anterior and lateral wall hypokinesis noted. No left ventricular dilation. Left Ventricular Ejection Fraction: 58 % End diastolic volume 90 ml End systolic volume 37 ml IMPRESSION: 1. Large reversible myocardial perfusion defect throughout the anterior, apical, and lateral walls of the left ventricle, consistent with myocardial ischemia. 2. Mild apical and lateral wall hypokinesis. 3. Left ventricular ejection fraction 58% 4. Non invasive risk stratification*: High *2012 Appropriate Use Criteria for Coronary Revascularization Focused Update: J Am Coll Cardiol. 202440;10(2):725-366http://content.onairportbarriers.comspx?articleid=[email or web address] Electronically Signed   By: JoMarlaine Hind.D.   On: 07/26/2022 12:06   ECHOCARDIOGRAM COMPLETE  Result Date: 07/25/2022    ECHOCARDIOGRAM REPORT   Patient Name:   Courtney DELAHUNTate of Exam: 07/25/2022 Medical Rec #:  [id number]      Height:       61.0 in Accession #:    [id number]     Weight:       255.0 lb Date of Birth:  12/1965-07-31      BSA:          2.094 m Patient Age:    [age]         BP:           149/74 mmHg Patient Gender: F                HR:           83 bpm. Exam Location:  Inpatient Procedure: 2D Echo, Cardiac Doppler, Color Doppler and Intracardiac            Opacification Agent Indications:    Chest pain  History:        Patient has prior history of Echocardiogram examinations, most                 recent 03/10/2022. CHF, CAD; Risk Factors:Hypertension,                 Dyslipidemia and Diabetes.  Sonographer:    ElMemory Argueeferring Phys:  [id number] A SMITH IMPRESSIONS  1. Left ventricular ejection fraction, by estimation, is 60 to 65%. The left ventricle has normal function. The left ventricle has no regional wall motion abnormalities. There is mild concentric left ventricular hypertrophy. Left ventricular diastolic parameters are indeterminate.  2. Right ventricular systolic function is normal. The right ventricular size is normal. Tricuspid regurgitation signal is inadequate for assessing PA pressure.  3. The mitral valve is degenerative. No evidence of mitral valve regurgitation. No evidence of mitral stenosis.  4. The aortic valve is tricuspid. Aortic  valve regurgitation is not visualized. Aortic valve sclerosis/calcification is present, without any evidence of aortic stenosis. Aortic valve area, by VTI measures 1.75 cm. Aortic valve mean gradient measures 6.0 mmHg. Aortic valve Vmax measures 1.71 m/s. FINDINGS  Left Ventricle: Left ventricular ejection fraction, by estimation, is 60 to 65%. The left ventricle has normal function. The left ventricle has no regional wall motion abnormalities. The left ventricular internal cavity size was normal in size. There is  mild concentric left ventricular hypertrophy. Left ventricular diastolic parameters are indeterminate. Normal left ventricular filling pressure. Right Ventricle: The right ventricular size is normal. No increase in right ventricular wall thickness. Right ventricular systolic function is normal. Tricuspid regurgitation signal is inadequate for assessing PA pressure. Left Atrium: Left atrial size was normal in size. Right Atrium: Right atrial size was normal in size. Pericardium: There is no evidence of pericardial effusion. Mitral Valve: The mitral valve is degenerative in appearance. There is mild calcification of the anterior mitral valve leaflet(s). Mild mitral annular calcification. No evidence of mitral valve regurgitation. No evidence of mitral valve stenosis. Tricuspid Valve:  The tricuspid valve is normal in structure. Tricuspid valve regurgitation is not demonstrated. No evidence of tricuspid stenosis. Aortic Valve: The aortic valve is tricuspid. Aortic valve regurgitation is not visualized. Aortic valve sclerosis/calcification is present, without any evidence of aortic stenosis. Aortic valve mean gradient measures 6.0 mmHg. Aortic valve peak gradient measures 11.7 mmHg. Aortic valve area, by VTI measures 1.75 cm. Pulmonic Valve: The pulmonic valve was normal in structure. Pulmonic valve regurgitation is not visualized. No evidence of pulmonic stenosis. Aorta: The aortic root is normal in size and structure. Venous: The inferior vena cava was not well visualized. IAS/Shunts: No atrial level shunt detected by color flow Doppler.  LEFT VENTRICLE PLAX 2D LVIDd:         4.00 cm   Diastology LVIDs:         2.80 cm   LV e' medial:    6.99 cm/s LV PW:         1.20 cm   LV E/e' medial:  12.8 LV IVS:        1.30 cm   LV e' lateral:   7.15 cm/s LVOT diam:     2.00 cm   LV E/e' lateral: 12.5 LV SV:         49 LV SV Index:   23 LVOT Area:     3.14 cm  RIGHT VENTRICLE RV S prime:     14.00 cm/s LEFT ATRIUM             Index        RIGHT ATRIUM          Index LA Vol (A2C):   65.5 ml 31.28 ml/m  RA Area:     9.95 cm LA Vol (A4C):   66.0 ml 31.52 ml/m  RA Volume:   20.40 ml 9.74 ml/m LA Biplane Vol: 66.9 ml 31.95 ml/m  AORTIC VALVE AV Area (Vmax):    1.68 cm AV Area (Vmean):   1.63 cm AV Area (VTI):     1.75 cm AV Vmax:           171.00 cm/s AV Vmean:          112.000 cm/s AV VTI:            0.278 m AV Peak Grad:      11.7 mmHg AV Mean Grad:      6.0 mmHg LVOT Vmax:  91.20 cm/s LVOT Vmean:        58.100 cm/s LVOT VTI:          0.155 m LVOT/AV VTI ratio: 0.56  AORTA Ao Root diam: 3.10 cm Ao Asc diam:  3.00 cm MITRAL VALVE MV Area (PHT): 4.15 cm    SHUNTS MV Decel Time: 183 msec    Systemic VTI:  0.16 m MV E velocity: 89.60 cm/s  Systemic Diam: 2.00 cm MV A velocity: 91.60 cm/s MV E/A  ratio:  0.98 Fransico Him MD Electronically signed by Fransico Him MD Signature Date/Time: 07/25/2022/10:27:43 AM    Final    DG Chest 1 View  Result Date: 07/24/2022 CLINICAL DATA:  Back pain. EXAM: CHEST  1 VIEW COMPARISON:  April 02, 2022 FINDINGS: The heart size and mediastinal contours are within normal limits. Both lungs are clear. There is mild dextroscoliosis of the lower thoracic spine. IMPRESSION: No active disease. Electronically Signed   By: Virgina Norfolk M.D.   On: 07/24/2022 18:31   DG Cervical Spine Complete  Result Date: 07/24/2022 CLINICAL DATA:  Left arm pain. EXAM: CERVICAL SPINE - COMPLETE 4+ VIEW COMPARISON:  None Available. FINDINGS: There is no evidence of cervical spine fracture or prevertebral soft tissue swelling. Alignment is normal. Mild to moderate severity anterior endplate sclerosis and mild anterior osteophyte formation are seen at the levels of C3-C4 and C5-C6. Mild to moderate severity intervertebral disc space narrowing is also seen at C3-C4. Mild to moderate severity bilateral neural foraminal narrowing is noted at C3-C4. No other significant bone abnormalities are identified. IMPRESSION: Mild to moderate severity degenerative disc disease at C3-C4 and C5-C6. Electronically Signed   By: Virgina Norfolk M.D.   On: 07/24/2022 18:24    CARDIAC DATABASE: EKG: 07/24/2022:  Sinus rhythm, 86 bpm, without underlying ischemia or injury pattern.   Sinus rhythm, 88 bpm, ST-T changes in inferolateral leads consider possible ischemia (present on prior EKGs).    07/25/2022: Normal sinus rhythm, 99 bpm, ST-T changes in inferolateral leads.   EKG 07/15/2022: Sinus rhythm 94 bpm  Nonspecific T-abnormality   EKG 06/12/2022: Sinus tachycardia 106 bpm  Nonspecific T-abnormality  Echocardiogram: 07/25/2022: LVEF 60-65%, mild LVH, indeterminate diastolic function, normal right ventricular size and function, no significant valvular heart disease.  Stress  test: Pharmacological stress test 2-day protocol: 07/25/2022 1. Large reversible myocardial perfusion defect throughout the anterior, apical, and lateral walls of the left ventricle, consistent with myocardial ischemia.   2. Mild apical and lateral wall hypokinesis.   3. Left ventricular ejection fraction 58%   4. Non invasive risk stratification*: High  Heart catheterization: Coronary intervention 05/26/2022: OM1: 90% complex lesion Intravascular ultrasound Successful PTCA OM1 with 2.0X15 Scoreflex and 2.0X6 mm W[place]ine cutting balloons     Coronary intervention 03/10/2022:  LV: 168/80, EDP 19 mmHg.  Ao 196/90, mean 134 mmHg.  No pressure gradient across the aortic valve. LM: Moderate caliber vessel.  No significant disease. LAD: Moderate caliber vessel throughout.  Mild diffuse disease.  Gives origin to several small diagonals, diagonal 2 and 3 which arises as bifurcation in the midsegment of the LAD have mild ostial disease.  Mid to distal segment of the LAD has a focal 40% stenosis. CX: Again a moderate caliber vessel giving origin to a very large territory distribution OM1, a 2.0 x 2.25 mm vessel with a high-grade ulcerated proximal tandem 95 to 99% stenosis.  OM 2 is very small caliber and again, moderate area of distribution, ostial 50% stenosis.  Distal  circumflex has a 40% stenosis. RCA: The mid segment of the RCA has a stent from 2013 and a 2.75 x 23 mm Xience stent from 10/28/2019.  These are overlapping.  The candy wrapper stenosis both at the inflow and outflow of the old stent from 2013 (proximal ISR, distal new lesion).  There is a 60 to 70% proximal stenosis starting from the proximal segment of the previously placed stent from 2021.   Interventional data: Difficult procedure due to poor visualization and resilient in-stent restenosis.  2.75 x 15 mm score flex balloon angioplasty at 20 atmospheric pressure followed by stenting with a 2.75 x 32 mm Synergy XT DES covering the entire  in-stent restenosis to outflow stenosis of previously placed stent from 2013. Successful stenting of the proximal to mid RCA overlapping stent that was placed on 10/28/2019 and the distal edge with a 2.74 x 20 mm Synergy XD deployed at 16 atmospheric pressure.  The entire stented segment was postdilated to 20 atm pressure giving a 3 mm lumen.  Recommendation: Patient extremely high risk for recurrent restenosis and progression of coronary disease in view of uncontrolled hypertension, diabetes mellitus and noncompliance with medical advice.  Reiterate risks to the patient.  Aspirin indefinitely and Brilinta for at least 1 year in view of NSTEMI.  Patient will be scheduled for elective stage intervention to a very large OM1 tomorrow if she remains stable through the right.  Although OM1 is 2.0-2.25 mm vessel, has a very large area of distribution and a high-grade ulcerated 99% stenosis.  She is at risk for developing recurrent ACS if untreated.  175 mL contrast utilized  Scheduled Meds:  amLODipine  10 mg Oral QPM   aspirin  81 mg Oral q AM   atorvastatin  40 mg Oral QPM   empagliflozin  10 mg Oral Daily   enoxaparin (LOVENOX) injection  40 mg Subcutaneous Q24H   ferrous sulfate  325 mg Oral QODAY   insulin aspart  0-9 Units Subcutaneous TID WC   isosorbide mononitrate  30 mg Oral Daily   losartan  25 mg Oral Daily   metoprolol succinate  50 mg Oral Daily   ticagrelor  90 mg Oral BID    Continuous Infusions:  ferric gluconate (FERRLECIT) IVPB 135 mL/hr at 07/26/22 1524    PRN Meds: acetaminophen, alum & mag hydroxide-simeth, LORazepam, ondansetron (ZOFRAN) IV   IMPRESSION & RECOMMENDATIONS: FALICITY SHEETS is a 56 y.o. African-American female whose past medical history and cardiac risk factors include: Hypertension, type 2 diabetes, CAD status post intervention, history of vaginal bleeding/anemia, obesity due to excess calories.  Impression: Established coronary artery disease with angina  pectoris (on arrival) with prior angioplasty and stenting History of NSTEMI May 2023. Abnormal nuclear stress test. Hypertension. Dyslipidemia. Non-insulin-dependent diabetes mellitus type 2. Anemia. Obesity due to excess calories  Plan: Established coronary artery disease with angina pectoris (on arrival) with prior angioplasty and stenting /abnormal nuclear stress test/history of NSTEMI Presents with exertional chest pain.  Currently asymptomatic. High sensitive troponins negative x2. ECG shows sinus rhythm with ST-T changes in inferolateral leads (noted on prior ECGs). Echo: Preserved LVEF, no regional wall motion abnormalities, no significant valvular heart disease. 2-day MPI stress test: Chest pain during stress protocol, ST depressions during stress, perfusion notes reversible defect suggestive of possible ischemia in the LAD/LCx distribution Antianginal therapy: Toprol-XL, Norvasc, Imdur Increase Toprol-XL to 100 mg p.o. daily starting tomorrow.   The procedure of left heart catheterization with possible intervention was explained  to the patient in detail.  The indication, alternatives, risks and benefits were reviewed.  Complications include but not limited to bleeding, infection, vascular injury, stroke, myocardial infarction, arrhythmia (requiring medical or cardiopulmonary resuscitation), kidney injury (requiring short-term or long-term hemodialysis), radiation-related injury in the case of prolonged fluoroscopy use, emergent cardiac surgery, temporary or permanent pacemaker, and death. The patient understands the risks of serious complication is 1-2 in 6438 with diagnostic cardiac cath and 1-2% or less with angioplasty/stenting.  The patient voices understanding and provides verbal feedback her questions and concerns are addressed to her satisfaction and patient wishes to proceed with coronary angiography with possible PCI.  Hypertension: Better control on current  regimen.  Dyslipidemia: Continue statin therapy.  Check lipid profile  Non-insulin-dependent diabetes mellitus type 2:  Reemphasized importance of glycemic control. Started on Jardiance 10 mg p.o. daily and losartan during this hospitalization. Patient states that she was started on Jardiance 25 mg as outpatient and developed 1 urinary tract infection and later discontinued. Given her cardiovascular comorbidities recommend retrial of SGLT2 inhibitors at a lower dose.  Patient is agreeable with the plan of care.  She will be more cognizant of UTI symptoms.  Recommendations conveyed to nursing staff as well as attending physician.  Total time spent: 39 minutes.  Patient's questions and concerns were addressed to her satisfaction. She voices understanding of the instructions provided during this encounter.   This note was created using a voice recognition software as a result there may be grammatical errors inadvertently enclosed that do not reflect the nature of this encounter. Every attempt is made to correct such errors.  Mechele Claude [facility]  Pager: [phone] Office: [phone] 07/26/2022, 5:40 PM

## 2022-07-27 ENCOUNTER — Encounter (HOSPITAL_COMMUNITY)
Admission: EM | Disposition: A | Discharge status: Home / Self Care | Payer: Self-pay | Source: Ambulatory Visit | Attending: Family Medicine

## 2022-07-27 ENCOUNTER — Inpatient Hospital Stay (HOSPITAL_COMMUNITY): Payer: Medicaid Other

## 2022-07-27 ENCOUNTER — Encounter (HOSPITAL_COMMUNITY): Payer: Medicaid Other

## 2022-07-27 DIAGNOSIS — R079 Chest pain, unspecified: Secondary | ICD-10-CM | POA: Diagnosis not present

## 2022-07-27 DIAGNOSIS — I25118 Atherosclerotic heart disease of native coronary artery with other forms of angina pectoris: Secondary | ICD-10-CM

## 2022-07-27 DIAGNOSIS — R7989 Other specified abnormal findings of blood chemistry: Secondary | ICD-10-CM

## 2022-07-27 DIAGNOSIS — I2510 Atherosclerotic heart disease of native coronary artery without angina pectoris: Secondary | ICD-10-CM

## 2022-07-27 HISTORY — PX: LEFT HEART CATH AND CORONARY ANGIOGRAPHY: CATH118249

## 2022-07-27 LAB — LDL CHOLESTEROL, DIRECT: Direct LDL: 67 mg/dL (ref 0–99)

## 2022-07-27 LAB — LIPID PANEL
Cholesterol: 105 mg/dL (ref 0–200)
HDL: 33 mg/dL — ABNORMAL LOW (ref >40)
LDL Cholesterol: 58 mg/dL (ref 0–99)
Total CHOL/HDL Ratio: 3.2 RATIO
Triglycerides: 71 mg/dL (ref <150)
VLDL: 14 mg/dL (ref 0–40)

## 2022-07-27 LAB — COMPREHENSIVE METABOLIC PANEL
ALT: 17 U/L (ref 0–44)
AST: 18 U/L (ref 15–41)
Albumin: 3.4 g/dL — ABNORMAL LOW (ref 3.5–5.0)
Alkaline Phosphatase: 92 U/L (ref 38–126)
Anion gap: 9 (ref 5–15)
BUN: 17 mg/dL (ref 6–20)
CO2: 24 mmol/L (ref 22–32)
Calcium: 9.3 mg/dL (ref 8.9–10.3)
Chloride: 105 mmol/L (ref 98–111)
Creatinine, Ser: 1.19 mg/dL — ABNORMAL HIGH (ref 0.44–1.00)
GFR, Estimated: 54 mL/min — ABNORMAL LOW (ref >60)
Glucose, Bld: 161 mg/dL — ABNORMAL HIGH (ref 70–99)
Potassium: 4 mmol/L (ref 3.5–5.1)
Sodium: 138 mmol/L (ref 135–145)
Total Bilirubin: 0.5 mg/dL (ref 0.3–1.2)
Total Protein: 7.5 g/dL (ref 6.5–8.1)

## 2022-07-27 LAB — CBC WITH DIFFERENTIAL/PLATELET
Abs Immature Granulocytes: 0.01 10*3/uL (ref 0.00–0.07)
Basophils Absolute: 0.1 10*3/uL (ref 0.0–0.1)
Basophils Relative: 1 %
Eosinophils Absolute: 0.1 10*3/uL (ref 0.0–0.5)
Eosinophils Relative: 1 %
HCT: 26.3 % — ABNORMAL LOW (ref 36.0–46.0)
Hemoglobin: 8.6 g/dL — ABNORMAL LOW (ref 12.0–15.0)
Immature Granulocytes: 0 %
Lymphocytes Relative: 23 %
Lymphs Abs: 1.4 10*3/uL (ref 0.7–4.0)
MCH: 22.2 pg — ABNORMAL LOW (ref 26.0–34.0)
MCHC: 32.7 g/dL (ref 30.0–36.0)
MCV: 68 fL — ABNORMAL LOW (ref 80.0–100.0)
Monocytes Absolute: 0.5 10*3/uL (ref 0.1–1.0)
Monocytes Relative: 9 %
Neutro Abs: 4 10*3/uL (ref 1.7–7.7)
Neutrophils Relative %: 66 %
Platelets: 341 10*3/uL (ref 150–400)
RBC: 3.87 MIL/uL (ref 3.87–5.11)
RDW: 16.1 % — ABNORMAL HIGH (ref 11.5–15.5)
WBC: 6 10*3/uL (ref 4.0–10.5)
nRBC: 0 % (ref 0.0–0.2)

## 2022-07-27 LAB — GLUCOSE, CAPILLARY
Glucose-Capillary: 163 mg/dL — ABNORMAL HIGH (ref 70–99)
Glucose-Capillary: 207 mg/dL — ABNORMAL HIGH (ref 70–99)
Glucose-Capillary: 111 mg/dL — ABNORMAL HIGH (ref 70–99)
Glucose-Capillary: 93 mg/dL (ref 70–99)
Glucose-Capillary: 102 mg/dL — ABNORMAL HIGH (ref 70–99)

## 2022-07-27 LAB — MAGNESIUM: Magnesium: 2.2 mg/dL (ref 1.7–2.4)

## 2022-07-27 LAB — PHOSPHORUS: Phosphorus: 4.3 mg/dL (ref 2.5–4.6)

## 2022-07-27 SURGERY — LEFT HEART CATH AND CORONARY ANGIOGRAPHY
Anesthesia: LOCAL

## 2022-07-27 MED ORDER — HEPARIN (PORCINE) IN NACL 1000-0.9 UT/500ML-% IV SOLN
INTRAVENOUS | Status: AC
  Filled 2022-07-27: qty 1000

## 2022-07-27 MED ORDER — SODIUM CHLORIDE 0.9% FLUSH: 3.0000 mL | Freq: Two times a day (BID) | INTRAVENOUS | Status: DC

## 2022-07-27 MED ORDER — MIDAZOLAM HCL 2 MG/2ML IJ SOLN
INTRAMUSCULAR | Status: DC | PRN
  Administered 2022-07-27: 2 mg via INTRAVENOUS

## 2022-07-27 MED ORDER — MIDAZOLAM HCL 2 MG/2ML IJ SOLN
INTRAMUSCULAR | Status: AC
  Filled 2022-07-27: qty 2

## 2022-07-27 MED ORDER — SODIUM CHLORIDE 0.9% FLUSH: 3.0000 mL | INTRAVENOUS | Status: DC | PRN

## 2022-07-27 MED ORDER — HEPARIN SODIUM (PORCINE) 1000 UNIT/ML IJ SOLN
INTRAMUSCULAR | Status: DC | PRN
  Administered 2022-07-27: 6000 [IU] via INTRAVENOUS

## 2022-07-27 MED ORDER — FENTANYL CITRATE (PF) 100 MCG/2ML IJ SOLN
INTRAMUSCULAR | Status: AC
  Filled 2022-07-27: qty 2

## 2022-07-27 MED ORDER — SODIUM CHLORIDE 0.9 % IV SOLN: 250.0000 mL | INTRAVENOUS | Status: DC | PRN

## 2022-07-27 MED ORDER — VERAPAMIL HCL 2.5 MG/ML IV SOLN
INTRAVENOUS | Status: DC | PRN
  Administered 2022-07-27: 5 mL via INTRA_ARTERIAL

## 2022-07-27 MED ORDER — SODIUM CHLORIDE 0.9 % WEIGHT BASED INFUSION: 1.0000 mL/kg/h | INTRAVENOUS | Status: AC

## 2022-07-27 MED ORDER — HEPARIN SODIUM (PORCINE) 1000 UNIT/ML IJ SOLN
INTRAMUSCULAR | Status: AC
  Filled 2022-07-27: qty 10

## 2022-07-27 MED ORDER — HEPARIN (PORCINE) IN NACL 2000-0.9 UNIT/L-% IV SOLN: INTRAVENOUS | Status: DC | PRN

## 2022-07-27 MED ORDER — LIDOCAINE HCL (PF) 1 % IJ SOLN
INTRAMUSCULAR | Status: DC | PRN
  Administered 2022-07-27: 2 mL

## 2022-07-27 MED ORDER — RANOLAZINE ER 500 MG PO TB12
1000.0000 mg | ORAL_TABLET | Freq: Two times a day (BID) | ORAL | Status: DC
  Administered 2022-07-27 – 2022-07-28 (×2): 1000 mg via ORAL
  Filled 2022-07-27 (×2): qty 2

## 2022-07-27 MED ORDER — FENTANYL CITRATE (PF) 100 MCG/2ML IJ SOLN
INTRAMUSCULAR | Status: DC | PRN
  Administered 2022-07-27: 25 ug via INTRAVENOUS

## 2022-07-27 MED ORDER — LIDOCAINE HCL (PF) 1 % IJ SOLN
INTRAMUSCULAR | Status: AC
  Filled 2022-07-27: qty 30

## 2022-07-27 MED ORDER — IOHEXOL 350 MG/ML SOLN
INTRAVENOUS | Status: DC | PRN
  Administered 2022-07-27: 130 mL

## 2022-07-27 MED ORDER — VERAPAMIL HCL 2.5 MG/ML IV SOLN
INTRAVENOUS | Status: AC
  Filled 2022-07-27: qty 2

## 2022-07-27 MED ORDER — HEPARIN (PORCINE) IN NACL 1000-0.9 UT/500ML-% IV SOLN
INTRAVENOUS | Status: DC | PRN
  Administered 2022-07-27 (×2): 500 mL

## 2022-07-27 SURGICAL SUPPLY — 13 items
CATH INFINITI 5 FR JL3.5 (CATHETERS) IMPLANT
CATH INFINITI 5FR AL1 (CATHETERS) IMPLANT
CATH LAUNCHER 5F JL3 (CATHETERS) IMPLANT
CATH OPTITORQUE TIG 4.0 5F (CATHETERS) IMPLANT
CATHETER LAUNCHER 5F JL3 (CATHETERS) ×1
DEVICE RAD COMP TR BAND LRG (VASCULAR PRODUCTS) IMPLANT
GLIDESHEATH SLEND A-KIT 6F 22G (SHEATH) IMPLANT
GUIDEWIRE INQWIRE 1.5J.035X260 (WIRE) IMPLANT
INQWIRE 1.5J .035X260CM (WIRE) ×1
KIT HEART LEFT (KITS) ×1 IMPLANT
PACK CARDIAC CATHETERIZATION (CUSTOM PROCEDURE TRAY) ×1 IMPLANT
TRANSDUCER W/STOPCOCK (MISCELLANEOUS) ×1 IMPLANT
TUBING CIL FLEX 10 FLL-RA (TUBING) ×1 IMPLANT

## 2022-07-27 NOTE — Interval H&P Note (Signed)
History and Physical Interval Note:  07/27/2022 4:48 PM  [name]  has presented today for surgery, with the diagnosis of unstable angina.  The various methods of treatment have been discussed with the patient and family. After consideration of risks, benefits and other options for treatment, the patient has consented to  Procedure(s): LEFT HEART CATH AND CORONARY ANGIOGRAPHY (N/A) and possible angioplasty as a surgical intervention.  The patient's history has been reviewed, patient examined, no change in status, stable for surgery.  I have reviewed the patient's chart and labs.  Questions were answered to the patient's satisfaction.    Cath Lab Visit (complete for each Cath Lab visit)  Clinical Evaluation Leading to the Procedure:   ACS: Yes.    Non-ACS:    Anginal Classification: CCS IV  Anti-ischemic medical therapy: Maximal Therapy (2 or more classes of medications)  Non-Invasive Test Results: High-risk stress test findings: cardiac mortality >3%/year  Prior CABG: No previous CABG  [name]

## 2022-07-27 NOTE — Progress Notes (Signed)
   07/27/22 1024  Mobility  Activity Ambulated independently in hallway  Activity Response Tolerated well  Distance Ambulated (ft) 500 ft  $Mobility charge 1 Mobility  Level of Assistance Independent  Assistive Device None  Mobility Referral Yes   Mobility Specialist Progress Note  Received pt in chair having no complaints and agreeable to mobility. Pt was asymptomatic throughout ambulation and returned to room w/o fault. Left in chair w/ call bell in reach and all needs met.   Lucious Groves Mobility Specialist

## 2022-07-27 NOTE — Care Management (Signed)
  Transition of Care [facility]) Screening Note   Patient Details  Name: [name] Date of Birth: May 05, 1966   [facility] of [name] CM/SW Contact:    Bethena Roys, RN Phone Number: 07/27/2022, 11:45 AM    Transition of Care Department [facility]) has reviewed the patient and no TOC needs have been identified at this time. We will continue to monitor patient advancement through interdisciplinary progression rounds. If new patient transition needs arise, please place a TOC consult.

## 2022-07-27 NOTE — Progress Notes (Signed)
PROGRESS NOTE    [name]  DJM:[id number] DOB: [birth year] DOA: 07/24/2022 PCP: [facility]  Chief Complaint  Patient presents with   Back Pain   Arm Pain    Brief Narrative:  [name] is [name] 56 y.o. female with medical history significant of hypertension, hyperlipidemia, CAD s/p stenting(RCA in 2013 and 2019), HFpEF, DVT in 2012 and no longer on anticoagulation, DM type II who presented from cardiac rehab with complaints of exertional arm and back pain.  Patient had underwent unsuccessful left heart cath 03/11/2022 after coming in for NSTEMI.  She followed up and had successful PTCA of 0M1 with Dr. Virgina Jock on 05/26/2022.  Patient reports that she has intermittently noted these pains in the middle of her back and left arm with any kind of exertion.  She had been taking Tylenol and symptoms seem to improve with rest.  Pains in her left arm still occur with certain movements.  She had been at cardiac rehab yesterday walking on the treadmill when she developed significant pain in her back and arm.  Noted associated symptoms of radiation into the left side of her neck, shortness of breath, and some mild left leg pain.  Denies having any significant leg swelling and does not sit for prolonged periods in time.  Note prior right lower extremity DVT back in 2012 for which patient was on Coumadin for some period in time, but currently is no longer on anticoagulation.  She has been able to lay flat at night.  Patient makes note that she had vaginal bleeding and had been sent to OB/GYN after being started on Brilinta back in April of this year.  Plan is for her to undergo some procedure, but her cardiologist recommends that she not be put to sleep for at least 6 months due to the recent cardiac intervention.  She has never had Brookelyn Gaynor colonoscopy before.  Her primary care provider had set her up for Cologuard test, but she has not sent that in yet.   Upon admission to the  emergency department patient was noted to be afebrile with mild tachypnea, blood pressure 139/109-149/74, and all other vital signs maintained.  EKG showed new T wave inversions in the inferior leads.  Labs noted hemoglobin 8.7 and slightly improved from prior, glucose 167, and high-sensitivity troponins negative x2.  Chest x-ray noted no acute abnormality.  X-rays of the cervical spine noted mild to moderate severe DDD at C3-4 and C5-6.  Patient had been given full dose aspirin.   Assessment & Plan:   Principal Problem:   Chest pain Active Problems:   Atherosclerosis of native coronary artery of native heart with stable angina pectoris (HCC)   Chronic iron deficiency anemia   Essential hypertension   Uncontrolled type 2 diabetes mellitus with hyperglycemia (HCC)   Non-insulin dependent type 2 diabetes mellitus (HCC)   Chronic diastolic CHF (congestive heart failure) (HCC)   Dyslipidemia   Morbidly obese (HCC)   History of coronary angioplasty with insertion of stent   Assessment and Plan: Chest pain/exertional arm and back pain CAD Acute.  Patient had been in cardiac rehab and reported complaints of exertional left arm, neck, and back pain.  High-sensitivity troponins negative x2, but EKG noting T wave inversions in the inferior leads.  Left arm pain is reproducible with certain arm movements.  CAD patient had just underwent  PTCA of 0M1 with Dr. Virgina Jock on 05/26/2022.  The differential includes cardiac cause, possibility  of Kaleiah Kutzer blood clot given prior history of DVT, or possible musculoskeletal in nature. -Admit to [facility] cardiac telemetry bed -Add on D-dimer -> follow LE [place] first - negative for DVT, will discuss further w/u -Check echocardiogram -> EF 60-65%, no RWMA - Stress test with large reversible myocardial perfusion defect throughout anterior, apical, and lateral walls of L ventrical, mild apical and lateral wall hypokinesis -> high risk stress test, planning for catheterization per cards  today -Continue aspirin, Brilinta, beta-blocker (dose increased epr cards), imdur, and statin -[facility] cardiology consulted, will follow-up for any further recommendations   Chronic iron deficiency anemia Chronic.  Hemoglobin 8.7 with low MCV and MCH which appears improved from prior.  Patient notes she has never had Deni Lefever colonoscopy and had been set up for Cologuard through PCP.  - Severe iron def anemia - IV iron - needs outpatient workup including colonoscopy   Essential hypertension Blood pressures on admission elevated up to 159/72, but patient had not missed doses of home blood pressure medications. - amlodipine, cozaar, norvasc, metoprolol (dose increased per cards), imdur per cards -Blood pressure regimen per cardiology -Hydralazine IV as needed for elevated blood pressure   Uncontrolled diabetes mellitus type 2 On admission glucose 167.  Last hemoglobin A1c 8.7 on 5/23.  Home medication regimen includes metformin 1000 mg twice daily and Ozempic 0.5 mg q. Sunday. -Hold metformin -Jardiance started per cardiology -CBGs before every meal with sensitive SSI   Diastolic congestive heart failure Chronic.  Echocardiogram revealed EF to be 55 -60% with indeterminate diastolic parameters in May.  Patient does not appear grossly fluid overloaded on physical exam. -Continue to monitor   History of DVT Patient with prior history of right lower extremity DVT back in 2012 treated with Coumadin and stopped due to menorrhagia after being on the medication for 5 months.   Dyslipidemia Last lipid panel done on 05/27/2022 noted total cholesterol 143, HDL 35, LDL 94, and triglycerides 69.  Patient was unable to afford Repatha. -Continue atorvastatin -Goal LDL less than 70   Morbid obesity BMI 48.18 kg/m.  Patient is on [place] in outpatient setting     DVT prophylaxis: lovenox Code Status: full Family Communication: none Disposition:   Status is: Observation The  patient will require care spanning > 2 midnights and should be moved to inpatient because: need for stress   Consultants:  cardiology  Procedures:  none  Antimicrobials:  Anti-infectives (From admission, onward)    None       Subjective: No complaints No CP or SOB this morning  Objective: Vitals:   07/26/22 0904 07/26/22 1935 07/27/22 0636 07/27/22 1156  BP: 129/72 (!) 144/68 111/61 121/73  Pulse: 89 81 88 84  Resp: '17 16 16 18  '$ Temp: 98.2 F (36.8 C) 97.9 F (36.6 C) 98.1 F (36.7 C) 98 F (36.7 C)  TempSrc: Oral Oral Oral Oral  SpO2:   98%   Weight:      Height:        Intake/Output Summary (Last 24 hours) at 07/27/2022 1624 Last data filed at 07/27/2022 1357 Gross per 24 hour  Intake 335.87 ml  Output --  Net 335.87 ml   Filed Weights   07/24/22 1645 07/25/22 1630  Weight: 115.7 kg 114.4 kg    Examination:  General: No acute distress. Lungs: unlabored Abdomen: Soft, nontender, nondistended Neurological: Alert and oriented 3. Moves all extremities 4 with equal strength. Cranial nerves II through XII grossly intact. Extremities: No clubbing or  cyanosis. No edema.   Data Reviewed: I have personally reviewed following labs and imaging studies  CBC: Recent Labs  Lab 07/24/22 1716 07/26/22 0824 07/27/22 0131  WBC 6.4 5.1 6.0  NEUTROABS  --  3.7 4.0  HGB 8.7* 9.0* 8.6*  HCT 27.6* 28.0* 26.3*  MCV 69.9* 69.1* 68.0*  PLT 346 316 619    Basic Metabolic Panel: Recent Labs  Lab 07/24/22 1716 07/26/22 0824 07/27/22 0131  NA 138 140 138  K 3.8 3.9 4.0  CL 104 105 105  CO2 '24 23 24  '$ GLUCOSE 167* 148* 161*  BUN '17 12 17  '$ CREATININE 1.03* 0.95 1.19*  CALCIUM 9.4 9.3 9.3  MG  --   --  2.2  PHOS  --   --  4.3    GFR: Estimated Creatinine Clearance: 64.7 mL/min (Kaitlynne Wenz) (by C-G formula based on SCr of 1.19 mg/dL (H)).  Liver Function Tests: Recent Labs  Lab 07/26/22 0824 07/27/22 0131  AST 17 18  ALT 16 17  ALKPHOS 94 92  BILITOT 0.4  0.5  PROT 7.1 7.5  ALBUMIN 3.5 3.4*    CBG: Recent Labs  Lab 07/26/22 2200 07/26/22 2258 07/27/22 0834 07/27/22 1158 07/27/22 1610  GLUCAP 244* 211* 163* 207* 111*     No results found for this or any previous visit (from the past 240 hour(s)).       Radiology Studies: VAS [place] LOWER EXTREMITY VENOUS (DVT)  Result Date: 07/27/2022  Lower Venous DVT Study Patient Name:  RAMONITA KOENIG  Date of Exam:   07/27/2022 Medical Rec #: [id number]         Accession #:    [id number] Date of Birth: [birth year]         Patient Gender: F Patient Age:   20 years Exam Location:  [facility] Procedure:      VAS [place] LOWER EXTREMITY VENOUS (DVT) Referring Phys: Chundra Sauerwein POWELL JR --------------------------------------------------------------------------------  Indications: Elevated D-dimer (0.71).  Risk Factors: HX of DVT (2012). Limitations: Poor ultrasound/tissue interface. Comparison Study: Previous exam on 05/13/14 was negative for DVT. Performing Technologist: Rogelia Rohrer RVT, RDMS  Examination Guidelines: Odie Edmonds complete evaluation includes B-mode imaging, spectral Doppler, color Doppler, and power Doppler as needed of all accessible portions of each vessel. Bilateral testing is considered an integral part of Lalitha Ilyas complete examination. Limited examinations for reoccurring indications may be performed as noted. The reflux portion of the exam is performed with the patient in reverse Trendelenburg.  +---------+---------------+---------+-----------+----------+--------------+ RIGHT    CompressibilityPhasicitySpontaneityPropertiesThrombus Aging +---------+---------------+---------+-----------+----------+--------------+ CFV      Full           Yes      Yes                                 +---------+---------------+---------+-----------+----------+--------------+ SFJ      Full                                                        +---------+---------------+---------+-----------+----------+--------------+  FV Prox  Full           Yes      Yes                                 +---------+---------------+---------+-----------+----------+--------------+  FV Mid   Full           Yes      Yes                                 +---------+---------------+---------+-----------+----------+--------------+ FV DistalFull           Yes      Yes                                 +---------+---------------+---------+-----------+----------+--------------+ PFV      Full                                                        +---------+---------------+---------+-----------+----------+--------------+ POP      Full           Yes      Yes                                 +---------+---------------+---------+-----------+----------+--------------+ PTV      Full                                                        +---------+---------------+---------+-----------+----------+--------------+ PERO     Full                                                        +---------+---------------+---------+-----------+----------+--------------+   +---------+---------------+---------+-----------+----------+--------------+ LEFT     CompressibilityPhasicitySpontaneityPropertiesThrombus Aging +---------+---------------+---------+-----------+----------+--------------+ CFV      Full           Yes      Yes                                 +---------+---------------+---------+-----------+----------+--------------+ SFJ      Full                                                        +---------+---------------+---------+-----------+----------+--------------+ FV Prox  Full           Yes      Yes                                 +---------+---------------+---------+-----------+----------+--------------+ FV Mid   Full           Yes      Yes                                 +---------+---------------+---------+-----------+----------+--------------+ FV DistalFull  Yes      Yes                                  +---------+---------------+---------+-----------+----------+--------------+ PFV      Full                                                        +---------+---------------+---------+-----------+----------+--------------+ POP      Full           Yes      Yes                                 +---------+---------------+---------+-----------+----------+--------------+ PTV      Full                                                        +---------+---------------+---------+-----------+----------+--------------+ PERO     Full                                                        +---------+---------------+---------+-----------+----------+--------------+     Summary: BILATERAL: - No evidence of deep vein thrombosis seen in the lower extremities, bilaterally. -No evidence of popliteal cyst, bilaterally.   *See table(s) above for measurements and observations. Electronically signed by Monica Martinez MD on 07/27/2022 at 11:55:27 AM.    Final    NM Myocar Multi W/Spect Tamela Oddi Motion / EF  Result Date: 07/26/2022 CLINICAL DATA:  Chest pain. Shortness of breath. Intermediate coronary artery disease risk. EXAM: MYOCARDIAL IMAGING WITH SPECT (REST AND PHARMACOLOGIC-STRESS - 2 DAY PROTOCOL) GATED LEFT VENTRICULAR WALL MOTION STUDY LEFT VENTRICULAR EJECTION FRACTION TECHNIQUE: Standard myocardial SPECT imaging was performed after resting intravenous injection of 31.3 mCi Tc-68mtetrofosmin. Subsequently, on Radonna Bracher second day, intravenous infusion of Lexiscan was performed under the supervision of the Cardiology staff. At peak effect of the drug, 31.1 mCi Tc-912metrofosmin was injected intravenously and standard myocardial SPECT imaging was performed. Quantitative gated imaging was also performed to evaluate left ventricular wall motion, and estimate left ventricular ejection fraction. COMPARISON:  None Available. FINDINGS: Perfusion: Large reversible myocardial perfusion  defect seen throughout the anterior, apical, and lateral walls, consistent with myocardial ischemia. Wall Motion: Anterior and lateral wall hypokinesis noted. No left ventricular dilation. Left Ventricular Ejection Fraction: 58 % End diastolic volume 90 ml End systolic volume 37 ml IMPRESSION: 1. Large reversible myocardial perfusion defect throughout the anterior, apical, and lateral walls of the left ventricle, consistent with myocardial ischemia. 2. Mild apical and lateral wall hypokinesis. 3. Left ventricular ejection fraction 58% 4. Non invasive risk stratification*: High *2012 Appropriate Use Criteria for Coronary Revascularization Focused Update: J Am Coll Cardiol. 202992;42(6):[email or web address] Electronically Signed   By: JoMarlaine Hind.D.   On: 07/26/2022 12:06        Scheduled Meds:  amLODipine  10 mg Oral QPM   aspirin  81  mg Oral q AM   atorvastatin  40 mg Oral QPM   empagliflozin  10 mg Oral Daily   enoxaparin (LOVENOX) injection  40 mg Subcutaneous Q24H   ferrous sulfate  325 mg Oral QODAY   insulin aspart  0-5 Units Subcutaneous QHS   insulin aspart  0-9 Units Subcutaneous TID WC   isosorbide mononitrate  30 mg Oral Daily   losartan  25 mg Oral Daily   metoprolol succinate  100 mg Oral Daily   sodium chloride flush  3 mL Intravenous Q12H   ticagrelor  90 mg Oral BID   Continuous Infusions:  sodium chloride     sodium chloride       LOS: 1 day    Time spent: over 30 min    Fayrene Helper, MD [facility]   To contact the attending provider between 7A-7P or the covering provider during after hours 7P-7A, please log into the web site [email or web address] and access using universal [place] password for that web site. If you do not have the password, please call the hospital operator.  07/27/2022, 4:24 PM

## 2022-07-27 NOTE — Progress Notes (Signed)
BLE venous duplex has been completed.   Results can be found under chart review under CV PROC. 07/27/2022 11:51 AM Neelam Tiggs RVT, RDMS

## 2022-07-28 ENCOUNTER — Encounter (HOSPITAL_COMMUNITY): Payer: Self-pay | Admitting: Cardiology

## 2022-07-28 ENCOUNTER — Inpatient Hospital Stay (HOSPITAL_COMMUNITY): Payer: Medicaid Other

## 2022-07-28 ENCOUNTER — Other Ambulatory Visit (HOSPITAL_COMMUNITY): Payer: Self-pay

## 2022-07-28 DIAGNOSIS — R079 Chest pain, unspecified: Secondary | ICD-10-CM | POA: Diagnosis not present

## 2022-07-28 LAB — COMPREHENSIVE METABOLIC PANEL
ALT: 15 U/L (ref 0–44)
AST: 18 U/L (ref 15–41)
Albumin: 3.5 g/dL (ref 3.5–5.0)
Alkaline Phosphatase: 92 U/L (ref 38–126)
Anion gap: 10 (ref 5–15)
BUN: 14 mg/dL (ref 6–20)
CO2: 21 mmol/L — ABNORMAL LOW (ref 22–32)
Calcium: 9.1 mg/dL (ref 8.9–10.3)
Chloride: 108 mmol/L (ref 98–111)
Creatinine, Ser: 1.06 mg/dL — ABNORMAL HIGH (ref 0.44–1.00)
GFR, Estimated: >60 mL/min (ref >60)
Glucose, Bld: 154 mg/dL — ABNORMAL HIGH (ref 70–99)
Potassium: 4.2 mmol/L (ref 3.5–5.1)
Sodium: 139 mmol/L (ref 135–145)
Total Bilirubin: 0.6 mg/dL (ref 0.3–1.2)
Total Protein: 7.4 g/dL (ref 6.5–8.1)

## 2022-07-28 LAB — PHOSPHORUS: Phosphorus: 3.8 mg/dL (ref 2.5–4.6)

## 2022-07-28 LAB — CBC WITH DIFFERENTIAL/PLATELET
Abs Immature Granulocytes: 0 10*3/uL (ref 0.00–0.07)
Basophils Absolute: 0.1 10*3/uL (ref 0.0–0.1)
Basophils Relative: 1 %
Eosinophils Absolute: 0 10*3/uL (ref 0.0–0.5)
Eosinophils Relative: 0 %
HCT: 26.3 % — ABNORMAL LOW (ref 36.0–46.0)
Hemoglobin: 8.6 g/dL — ABNORMAL LOW (ref 12.0–15.0)
Lymphocytes Relative: 8 %
Lymphs Abs: 0.5 10*3/uL — ABNORMAL LOW (ref 0.7–4.0)
MCH: 22.4 pg — ABNORMAL LOW (ref 26.0–34.0)
MCHC: 32.7 g/dL (ref 30.0–36.0)
MCV: 68.5 fL — ABNORMAL LOW (ref 80.0–100.0)
Monocytes Absolute: 0.1 10*3/uL (ref 0.1–1.0)
Monocytes Relative: 1 %
Neutro Abs: 5.9 10*3/uL (ref 1.7–7.7)
Neutrophils Relative %: 90 %
Platelets: 340 10*3/uL (ref 150–400)
RBC: 3.84 MIL/uL — ABNORMAL LOW (ref 3.87–5.11)
RDW: 16.4 % — ABNORMAL HIGH (ref 11.5–15.5)
WBC: 6.6 10*3/uL (ref 4.0–10.5)
nRBC: 0 % (ref 0.0–0.2)
nRBC: 0 /100 WBC

## 2022-07-28 LAB — MAGNESIUM: Magnesium: 2.2 mg/dL (ref 1.7–2.4)

## 2022-07-28 LAB — GLUCOSE, CAPILLARY
Glucose-Capillary: 123 mg/dL — ABNORMAL HIGH (ref 70–99)
Glucose-Capillary: 150 mg/dL — ABNORMAL HIGH (ref 70–99)
Glucose-Capillary: 104 mg/dL — ABNORMAL HIGH (ref 70–99)

## 2022-07-28 MED ORDER — ISOSORBIDE MONONITRATE ER 30 MG PO TB24
30.0000 mg | ORAL_TABLET | Freq: Every day | ORAL | 1 refills | Status: DC
  Filled 2022-07-28 – 2022-08-26 (×2): qty 30, 30d supply, fill #0
  Filled 2022-08-26: qty 30, 30d supply, fill #1

## 2022-07-28 MED ORDER — AMLODIPINE BESYLATE 10 MG PO TABS
10.0000 mg | ORAL_TABLET | Freq: Every evening | ORAL | 1 refills | Status: DC
  Filled 2022-07-28 – 2022-08-26 (×2): qty 30, 30d supply, fill #0
  Filled 2022-08-26: qty 30, 30d supply, fill #1

## 2022-07-28 MED ORDER — RANOLAZINE ER 1000 MG PO TB12
1000.0000 mg | ORAL_TABLET | Freq: Two times a day (BID) | ORAL | 1 refills | Status: DC
  Filled 2022-07-28: qty 60, 30d supply, fill #0
  Filled 2022-08-31: qty 60, 30d supply, fill #1

## 2022-07-28 MED ORDER — METOPROLOL SUCCINATE ER 100 MG PO TB24
100.0000 mg | ORAL_TABLET | Freq: Every day | ORAL | 1 refills | Status: DC
  Filled 2022-07-28: qty 30, 30d supply, fill #0
  Filled 2022-08-26: qty 30, 30d supply, fill #1
  Filled 2022-08-26: qty 30, 30d supply, fill #0

## 2022-07-28 MED ORDER — LOSARTAN POTASSIUM 25 MG PO TABS
25.0000 mg | ORAL_TABLET | Freq: Every day | ORAL | 1 refills | Status: DC
  Filled 2022-07-28 – 2022-08-26 (×2): qty 30, 30d supply, fill #0
  Filled 2022-08-26: qty 30, 30d supply, fill #1

## 2022-07-28 MED ORDER — EMPAGLIFLOZIN 10 MG PO TABS
10.0000 mg | ORAL_TABLET | Freq: Every day | ORAL | 1 refills | Status: DC
  Filled 2022-07-28: qty 30, 30d supply, fill #0
  Filled 2022-08-26: qty 30, 30d supply, fill #1
  Filled 2022-08-26: qty 30, 30d supply, fill #0

## 2022-07-28 MED ORDER — IOHEXOL 350 MG/ML SOLN
70.0000 mL | Freq: Once | INTRAVENOUS | Status: AC | PRN
  Administered 2022-07-28: 70 mL via INTRAVENOUS

## 2022-07-28 NOTE — Discharge Summary (Addendum)
Physician Discharge Summary  [name] [name]:[id number] DOB: 1966/05/17 DOA: 07/24/2022  PCP: [facility]  Admit date: 07/24/2022 Discharge date: 07/28/2022  Time spent: 40 minutes  Recommendations for Outpatient Follow-up:  Follow outpatient CBC/CMP  Follow with cards outpatient Follow BP with adjustments made here Follow iron def outpatient, s/p IV iron here - needs additional w/u with colonoscopy, etc outpatient  She said she was instructed to stop lasix per cards, will hold for now, follow outpatient  Discharge Diagnoses:  Principal Problem:   Chest pain Active Problems:   Atherosclerosis of native coronary artery of native heart with stable angina pectoris (HCC)   Chronic iron deficiency anemia   Essential hypertension   Uncontrolled type 2 diabetes mellitus with hyperglycemia ([name])   Non-insulin dependent type 2 diabetes mellitus ([place])   Chronic diastolic CHF (congestive heart failure) ([name])   Dyslipidemia   Morbidly obese ([name])   History of coronary angioplasty with insertion of stent   Coronary arteriosclerosis after percutaneous transluminal coronary angioplasty (PTCA)   Discharge Condition: stable  Diet recommendation: heart healthy, diabetic  Filed Weights   07/24/22 1645 07/25/22 1630  Weight: 115.7 kg 114.4 kg    History of present illness:  [name] is [name] 56 y.o. female with medical history significant of hypertension, hyperlipidemia, CAD s/p stenting(RCA in 2013 and 2019), HFpEF, DVT in 2012 and no longer on anticoagulation, DM type II who presented from cardiac rehab with complaints of exertional arm and back pain.  Patient had underwent unsuccessful left heart cath 03/11/2022 after coming in for NSTEMI.  She followed up and had successful PTCA of 0M1 with Dr. Virgina [name] on 05/26/2022.    She was admitted with for CP r/o.  She had high risk stress test.  LHC concerning for recurrent restenosis (see below).  Plan to  optimize medically, cardiology planning to discuss her case at the heart team meeting to discuss surgical options.  See below for additional details  Hospital Course:  Assessment and Plan: Chest pain/exertional arm and back pain CAD Acute.  Patient had been in cardiac rehab and reported complaints of exertional left arm, neck, and back pain.  High-sensitivity troponins negative x2, but EKG noting T wave inversions in the inferior leads.  Left arm pain is reproducible with certain arm movements.  CAD patient had just underwent  PTCA of 0M1 with Dr. Virgina [name] on 05/26/2022.  The differential includes cardiac cause, possibility of [name] blood clot given prior history of DVT, or possible musculoskeletal in nature. -Admit to [name] cardiac telemetry bed -Add on D-dimer -> follow LE [place] first - negative for DVT and CT PE protocol negative for PE -Check echocardiogram -> EF 60-65%, no RWMA - Stress test with large reversible myocardial perfusion defect throughout anterior, apical, and lateral walls of L ventrical, mild apical and lateral wall hypokinesis -> high risk stress test - LHC showing recurrent restenosis of 70-80% in the mid RCA at the site of the previous stenosis which is covered by DES, new progression of PL branch stenosis from previous 60-70% to the present 90-95%.  Restenosis at the balloon angioplasty site of the large distribution however diffusely diseased small calibered OM1 which has [name] large lateral branch.  I am concerned about recurrence of restenosis in the RCA and also concerned about balloon angioplasty results of OM1 again as it is small and unable to be stented.  As she has class III angina pectosis, could optimize medically with aggressive weight loss,  control of hypertension, aggressive lipid status control, and add ranexa.  Consider 2 vessel cabg. - Cardiology today recommending ranexa 1000 mg BID and discuss with heart team meeting surgical revascularization options -Continue aspirin,  Brilinta, beta-blocker (dose increased epr cards), imdur, and statin -[name] cardiology consulted, will follow-up for any further recommendations   Chronic iron deficiency anemia Chronic.  Hemoglobin 8.7 with low MCV and MCH which appears improved from prior.  Patient notes she has never had [name] colonoscopy and had been set up for Cologuard through PCP.  - Severe iron def anemia - IV iron - needs outpatient workup including colonoscopy   Essential hypertension Blood pressures on admission elevated up to 159/72, but patient had not missed doses of home blood pressure medications. - amlodipine (dose increased), cozaar (started), norvasc (dose increased), metoprolol (dose increased per cards), imdur per cards (transitioned from isordil) -Blood pressure regimen per cardiology -Hydralazine IV as needed for elevated blood pressure   Uncontrolled diabetes mellitus type 2 On admission glucose 167.  Last hemoglobin A1c 8.7 on 5/23.  Home medication regimen includes metformin 1000 mg twice daily and Ozempic 0.5 mg q. Sunday. -Hold metformin -Jardiance started per cardiology -CBGs before every meal with sensitive SSI   Diastolic congestive heart failure Chronic.  Echocardiogram revealed EF to be 55 -60% with indeterminate diastolic parameters in May.  Patient does not appear grossly fluid overloaded on physical exam. -Continue to monitor   History of DVT Patient with prior history of right lower extremity DVT back in 2012 treated with Coumadin and stopped due to menorrhagia after being on the medication for 5 months.   Dyslipidemia Last lipid panel done on 05/27/2022 noted total cholesterol 143, HDL 35, LDL 94, and triglycerides 69.  Patient was unable to afford Repatha. -Continue atorvastatin -Goal LDL less than 70   Morbid obesity BMI 48.18 kg/m.  Patient is on [place] in outpatient setting      Procedures: LHC Impression: Recurrent restenosis of 70 to 80% in the  mid RCA at the site of previous stenosis which is covered by DES.  There is new progression of PL branch stenosis from previous 60 to 70% to the present 90 to 95%.   There is restenosis at the balloon angioplasty site of the large distribution however diffusely diseased small calibered OM1 which has Heily Carlucci large lateral branch.   I am concerned about recurrence of restenosis in the right coronary artery and also concerned about balloon angioplasty results of OM1 again as it is small and unable to be stented.   As she has class III angina pectoris, we could optimize her medically with aggressive weight loss, control of hypertension, aggressive lipid status control and add Ranexa.  Could consider two-vessel CABG to RCA and OM.  I will also present her case to heart team for collective opinion.  130 mL contrast utilized.   LE [place] Summary:  BILATERAL:  - No evidence of deep vein thrombosis seen in the lower extremities,  bilaterally.  -No evidence of popliteal cyst, bilaterally.     Echo IMPRESSIONS     1. Left ventricular ejection fraction, by estimation, is 60 to 65%. The  left ventricle has normal function. The left ventricle has no regional  wall motion abnormalities. There is mild concentric left ventricular  hypertrophy. Left ventricular diastolic  parameters are indeterminate.   2. Right ventricular systolic function is normal. The right ventricular  size is normal. Tricuspid regurgitation signal is inadequate for assessing  PA pressure.  3. The mitral valve is degenerative. No evidence of mitral valve  regurgitation. No evidence of mitral stenosis.   4. The aortic valve is tricuspid. Aortic valve regurgitation is not  visualized. Aortic valve sclerosis/calcification is present, without any  evidence of aortic stenosis. Aortic valve area, by VTI measures 1.75 cm.  Aortic valve mean gradient measures  6.0 mmHg. Aortic valve Vmax measures 1.71 m/s.      Consultations: cardiology  Discharge Exam: Vitals:   07/28/22 0519 07/28/22 0800  BP: (!) 108/58 132/76  Pulse:  96  Resp: 16 14  Temp: 98 F (36.7 C) 97.6 F (36.4 C)  SpO2:  99%   No complaints  General: No acute distress. Cardiovascular: Heart sounds show Taysia Rivere regular rate, and rhythm. No gallops or rubs. No murmurs. No JVD. Lungs: Clear to auscultation bilaterally with good air movement. No rales, rhonchi or wheezes. Abdomen: Soft, nontender, nondistended with normal active bowel sounds. No masses. No hepatosplenomegaly. Neurological: Alert and oriented 3. Moves all extremities 4 with equal strength. Cranial nerves II through XII grossly intact. Skin: Warm and dry. No rashes or lesions. Extremities: No clubbing or cyanosis. No edema. Discharge Instructions   Discharge Instructions     Diet - low sodium heart healthy   Complete by: As directed    Discharge instructions   Complete by: As directed    You were seen for chest pain.   You had Daviana Haymaker positive stress test and then Patrice Matthew catheterization which showed restenosis.    Cardiology is recommending adding ranexa twice daily.  They've also made some adjustments to your other medications (amlodipine increased to 10 mg, isordil has been changed to imdur 30 mg, metoprolol increased to 100 mg, you've been started on losartan 25 mg and jardiance 10 mg).  It's extremely important that you continue your aspirin and brilinta.  Please follow up with Dr. Virgina [name] as scheduled on 08/10/2022 at 3 PM.  Cardiology is going to discuss your case in the heart team meetings regarding surgical options.  You had an evaluation for blood clots which was negative.  We gave you IV iron.  Follow with your PCP outpatient for additional workup for your iron deficiency anemia (low blood counts related to low iron).  You need Merci Walthers colonoscopy.  Return for new, recurrent, or worsening symptoms.  Please ask your PCP to request records from this  hospitalization so they know what was done and what the next steps will be.   Increase activity slowly   Complete by: As directed       Allergies as of 07/28/2022       Reactions   Detrol [tolterodine Tartrate] Rash        Medication List     STOP taking these medications    furosemide 40 MG tablet Commonly known as: LASIX   isosorbide dinitrate 20 MG tablet Commonly known as: ISORDIL       TAKE these medications    acetaminophen 325 MG tablet Commonly known as: TYLENOL Take 650 mg by mouth every 6 (six) hours as needed (pain.).   amLODipine 10 MG tablet Commonly known as: NORVASC Take 1 tablet (10 mg total) by mouth every evening. What changed:  medication strength how much to take when to take this   aspirin 81 MG chewable tablet Chew 81 mg by mouth in the morning.   atorvastatin 40 MG tablet Commonly known as: LIPITOR Take 1 tablet (40 mg total) by mouth every evening.   Brilinta 90 MG  Tabs tablet Generic drug: ticagrelor Take 1 tablet (90 mg total) by mouth 2 (two) times daily.   Iron 325 (65 Fe) MG Tabs Take 325 mg by mouth 2 (two) times daily.   isosorbide mononitrate 30 MG 24 hr tablet Commonly known as: IMDUR Take 1 tablet (30 mg total) by mouth daily. Start taking on: July 29, 2022   Jardiance 10 MG Tabs tablet Generic drug: empagliflozin Take 1 tablet (10 mg total) by mouth daily. Start taking on: July 29, 2022   losartan 25 MG tablet Commonly known as: COZAAR Take 1 tablet (25 mg total) by mouth daily. Start taking on: July 29, 2022   metFORMIN 1000 MG tablet Commonly known as: GLUCOPHAGE Take 1,000 mg by mouth 2 (two) times daily with Jacorie Ernsberger meal.   metoprolol succinate 100 MG 24 hr tablet Commonly known as: TOPROL-XL Take 1 tablet (100 mg total) by mouth daily. Take with or immediately following Rayn Enderson meal. Start taking on: July 29, 2022 What changed:  medication strength how much to take additional instructions    nitroGLYCERIN 0.3 MG SL tablet Commonly known as: NITROSTAT Place 0.3 mg under the tongue every 5 (five) minutes as needed for chest pain.   OZEMPIC (0.25 OR 0.5 MG/DOSE) [place] Inject 0.5 mg into the skin every Sunday.   ranolazine 1000 MG SR tablet Commonly known as: RANEXA Take 1 tablet (1,000 mg total) by mouth 2 (two) times daily.       Allergies  Allergen Reactions   Detrol [Tolterodine Tartrate] Rash    Follow-up Information     [name], MD Follow up on 08/10/2022.   Specialties: Cardiology, Radiology Why: 3 PM Contact information: [place] [address] [phone]                  The results of significant diagnostics from this hospitalization (including imaging, microbiology, ancillary and laboratory) are listed below for reference.    Significant Diagnostic Studies: CT Angio Chest Pulmonary Embolism (PE) W or WO Contrast  Result Date: 07/28/2022 CLINICAL DATA:  Inpatient.  Elevated D-dimer. EXAM: CT ANGIOGRAPHY CHEST WITH CONTRAST TECHNIQUE: Multidetector CT imaging of the chest was performed using the standard protocol during bolus administration of intravenous contrast. Multiplanar CT image reconstructions and MIPs were obtained to evaluate the vascular anatomy. RADIATION DOSE REDUCTION: This exam was performed according to the departmental dose-optimization program which includes automated exposure control, adjustment of the mA and/or kV according to patient size and/or use of iterative reconstruction technique. CONTRAST:  38m OMNIPAQUE IOHEXOL 350 MG/ML SOLN COMPARISON:  07/24/2022 chest radiograph. 10/27/2017 chest CT angiogram. FINDINGS: Cardiovascular: The study is high quality for the evaluation of pulmonary embolism. There are no filling defects in the central, lobar, segmental or subsegmental pulmonary artery branches to suggest acute pulmonary embolism. Mildly atherosclerotic nonaneurysmal thoracic aorta.  Normal caliber pulmonary arteries. Normal heart size. No significant pericardial fluid/thickening. Left anterior descending and right coronary atherosclerosis. Mediastinum/Nodes: No significant thyroid nodules. Unremarkable esophagus. No pathologically enlarged axillary, mediastinal or hilar lymph nodes. Lungs/Pleura: No pneumothorax. No pleural effusion. No acute consolidative airspace disease, lung masses or significant pulmonary nodules. Upper abdomen: No acute abnormality. Musculoskeletal: No aggressive appearing focal osseous lesions. Moderate thoracic spondylosis. Review of the MIP images confirms the above findings. IMPRESSION: 1. No pulmonary embolism. No active pulmonary disease. 2. Two-vessel coronary atherosclerosis. 3.  Aortic Atherosclerosis (ICD10-I70.0). Electronically Signed   By: JIlona SorrelM.D.   On: 07/28/2022 14:30   CARDIAC  CATHETERIZATION  Result Date: 07/27/2022 Images from the original result were not included.   Mid Cx lesion is 70% stenosed.   Ost Cx lesion is 40% stenosed.   Ost 2nd Mrg to 2nd Mrg lesion is 50% stenosed.   Ost 1st Mrg to 1st Mrg lesion is 95% stenosed.   Lat 1st Mrg lesion is 99% stenosed.   RPAV lesion is 90% stenosed.   Mid RCA-3 lesion is 80% stenosed.   Dist Cx lesion is 70% stenosed.   Ost RCA to Prox RCA lesion is 40% stenosed.   Mid LAD lesion is 10% stenosed.   Ost 2nd Diag to 2nd Diag lesion is 10% stenosed.   Non-stenotic Mid RCA-1 lesion was previously treated.   Non-stenotic Mid RCA-2 lesion was previously treated.   Non-stenotic Prox RCA to Mid RCA lesion was previously treated.   Non-stenotic Mid RCA to Dist RCA lesion was previously treated. Left Heart Catheterization 07/27/22: LV: 131/3, EDP 9 mmHg.  Ao 169/87, mean 119 mmHg.  No pressure gradient across the aortic valve. RCA: Large and dominant.  Probably one of the large vessels for her.  Previously placed mid RCA stent from 2011 has again in-stent restenosis of 70 to 80%.  Proximal to the stent and  distal to the stent.  Earla Charlie 2.75 x 32 mm and Zared Knoth 2.75 x 20 mm Synergy XT DES stents are present and widely patent except at the focal prior stent area which was previously called on 03/10/2022. The PL branch which is large has Kinston Magnan focal 90% stenosis in the proximal segment. LM: Moderate caliber vessel, no significant disease. LAD: Moderate caliber vessel with mild diffuse disease.  D1 is small.  D2 and D3 arise close by to each other and moderate vessels. LCx: Moderate caliber vessel.  Ostium is Rehanna Oloughlin 30 to 40% stenosis.  Gives origin to Lakeria Starkman small caliber however large territory OM1 with secondary lateral branch.  There is restenosis in the entire proximal and mid segment.  Balloon angioplasty site performed 05/26/2022 with 2 mm balloon. Distal circumflex is small, has tandem 70% stenosis. Impression: Recurrent restenosis of 70 to 80% in the mid RCA at the site of previous stenosis which is covered by DES.  There is new progression of PL branch stenosis from previous 60 to 70% to the present 90 to 95%. There is restenosis at the balloon angioplasty site of the large distribution however diffusely diseased small calibered OM1 which has Ladana Chavero large lateral branch. I am concerned about recurrence of restenosis in the right coronary artery and also concerned about balloon angioplasty results of OM1 again as it is small and unable to be stented. As she has class III angina pectoris, we could optimize her medically with aggressive weight loss, control of hypertension, aggressive lipid status control and add Ranexa.  Could consider two-vessel CABG to RCA and OM.  I will also present her case to heart team for collective opinion.  130 mL contrast utilized. [name], MD, [facility] 07/27/2022, 6:36 PM Office: [phone] Fax: [phone] Pager: [phone]   VAS [place] LOWER EXTREMITY VENOUS (DVT)  Result Date: 07/27/2022  Lower Venous DVT Study Patient Name:  [name]  Date of Exam:   07/27/2022 Medical Rec #: [id number]         Accession #:     [id number] Date of Birth: 1966/07/20         Patient Gender: F Patient Age:   38 years Exam Location:  [facility] Procedure:  VAS [place] LOWER EXTREMITY VENOUS (DVT) Referring Phys: Elver Stadler POWELL JR --------------------------------------------------------------------------------  Indications: Elevated D-dimer (0.71).  Risk Factors: HX of DVT (2012). Limitations: Poor ultrasound/tissue interface. Comparison Study: Previous exam on 05/13/14 was negative for DVT. Performing Technologist: Rogelia Rohrer RVT, RDMS  Examination Guidelines: Trinten Boudoin complete evaluation includes B-mode imaging, spectral Doppler, color Doppler, and power Doppler as needed of all accessible portions of each vessel. Bilateral testing is considered an integral part of Ancel Easler complete examination. Limited examinations for reoccurring indications may be performed as noted. The reflux portion of the exam is performed with the patient in reverse Trendelenburg.  +---------+---------------+---------+-----------+----------+--------------+ RIGHT    CompressibilityPhasicitySpontaneityPropertiesThrombus Aging +---------+---------------+---------+-----------+----------+--------------+ CFV      Full           Yes      Yes                                 +---------+---------------+---------+-----------+----------+--------------+ SFJ      Full                                                        +---------+---------------+---------+-----------+----------+--------------+ FV Prox  Full           Yes      Yes                                 +---------+---------------+---------+-----------+----------+--------------+ FV Mid   Full           Yes      Yes                                 +---------+---------------+---------+-----------+----------+--------------+ FV DistalFull           Yes      Yes                                 +---------+---------------+---------+-----------+----------+--------------+ PFV      Full                                                         +---------+---------------+---------+-----------+----------+--------------+ POP      Full           Yes      Yes                                 +---------+---------------+---------+-----------+----------+--------------+ PTV      Full                                                        +---------+---------------+---------+-----------+----------+--------------+ PERO     Full                                                        +---------+---------------+---------+-----------+----------+--------------+   +---------+---------------+---------+-----------+----------+--------------+  LEFT     CompressibilityPhasicitySpontaneityPropertiesThrombus Aging +---------+---------------+---------+-----------+----------+--------------+ CFV      Full           Yes      Yes                                 +---------+---------------+---------+-----------+----------+--------------+ SFJ      Full                                                        +---------+---------------+---------+-----------+----------+--------------+ FV Prox  Full           Yes      Yes                                 +---------+---------------+---------+-----------+----------+--------------+ FV Mid   Full           Yes      Yes                                 +---------+---------------+---------+-----------+----------+--------------+ FV DistalFull           Yes      Yes                                 +---------+---------------+---------+-----------+----------+--------------+ PFV      Full                                                        +---------+---------------+---------+-----------+----------+--------------+ POP      Full           Yes      Yes                                 +---------+---------------+---------+-----------+----------+--------------+ PTV      Full                                                         +---------+---------------+---------+-----------+----------+--------------+ PERO     Full                                                        +---------+---------------+---------+-----------+----------+--------------+     Summary: BILATERAL: - No evidence of deep vein thrombosis seen in the lower extremities, bilaterally. -No evidence of popliteal cyst, bilaterally.   *See table(s) above for measurements and observations. Electronically signed by Monica Martinez MD on 07/27/2022 at 11:55:27 AM.    Final    NM Myocar Multi W/Spect Tamela Oddi Motion / EF  Result Date: 07/26/2022 CLINICAL  DATA:  Chest pain. Shortness of breath. Intermediate coronary artery disease risk. EXAM: MYOCARDIAL IMAGING WITH SPECT (REST AND PHARMACOLOGIC-STRESS - 2 DAY PROTOCOL) GATED LEFT VENTRICULAR WALL MOTION STUDY LEFT VENTRICULAR EJECTION FRACTION TECHNIQUE: Standard myocardial SPECT imaging was performed after resting intravenous injection of 31.3 mCi Tc-21mtetrofosmin. Subsequently, on Sabah Zucco second day, intravenous infusion of Lexiscan was performed under the supervision of the Cardiology staff. At peak effect of the drug, 31.1 mCi Tc-925metrofosmin was injected intravenously and standard myocardial SPECT imaging was performed. Quantitative gated imaging was also performed to evaluate left ventricular wall motion, and estimate left ventricular ejection fraction. COMPARISON:  None Available. FINDINGS: Perfusion: Large reversible myocardial perfusion defect seen throughout the anterior, apical, and lateral walls, consistent with myocardial ischemia. Wall Motion: Anterior and lateral wall hypokinesis noted. No left ventricular dilation. Left Ventricular Ejection Fraction: 58 % End diastolic volume 90 ml End systolic volume 37 ml IMPRESSION: 1. Large reversible myocardial perfusion defect throughout the anterior, apical, and lateral walls of the left ventricle, consistent with myocardial ischemia. 2. Mild apical and lateral wall  hypokinesis. 3. Left ventricular ejection fraction 58% 4. Non invasive risk stratification*: High *2012 Appropriate Use Criteria for Coronary Revascularization Focused Update: J Am Coll Cardiol. 202774;12(8):786-767http://content.onairportbarriers.comspx?articleid=1201161 Electronically Signed   By: JoMarlaine Hind.D.   On: 07/26/2022 12:06   ECHOCARDIOGRAM COMPLETE  Result Date: 07/25/2022    ECHOCARDIOGRAM REPORT   Patient Name:   Courtney HINTZEate of Exam: 07/25/2022 Medical Rec #:  [id number]      Height:       61.0 in Accession #:    [id number]     Weight:       255.0 lb Date of Birth:  12/19/20/1967      BSA:          2.094 m Patient Age:    [age]         BP:           149/74 mmHg Patient Gender: F                HR:           83 bpm. Exam Location:  Inpatient Procedure: 2D Echo, Cardiac Doppler, Color Doppler and Intracardiac            Opacification Agent Indications:    Chest pain  History:        Patient has prior history of Echocardiogram examinations, most                 recent 03/10/2022. CHF, CAD; Risk Factors:Hypertension,                 Dyslipidemia and Diabetes.  Sonographer:    ElMemory Argueeferring Phys: [id number] Ivyrose Hashman SMITH IMPRESSIONS  1. Left ventricular ejection fraction, by estimation, is 60 to 65%. The left ventricle has normal function. The left ventricle has no regional wall motion abnormalities. There is mild concentric left ventricular hypertrophy. Left ventricular diastolic parameters are indeterminate.  2. Right ventricular systolic function is normal. The right ventricular size is normal. Tricuspid regurgitation signal is inadequate for assessing PA pressure.  3. The mitral valve is degenerative. No evidence of mitral valve regurgitation. No evidence of mitral stenosis.  4. The aortic valve is tricuspid. Aortic valve regurgitation is not visualized. Aortic valve sclerosis/calcification is present, without any evidence of aortic stenosis. Aortic valve area, by VTI  measures 1.75 cm. Aortic valve mean  gradient measures 6.0 mmHg. Aortic valve Vmax measures 1.71 m/s. FINDINGS  Left Ventricle: Left ventricular ejection fraction, by estimation, is 60 to 65%. The left ventricle has normal function. The left ventricle has no regional wall motion abnormalities. The left ventricular internal cavity size was normal in size. There is  mild concentric left ventricular hypertrophy. Left ventricular diastolic parameters are indeterminate. Normal left ventricular filling pressure. Right Ventricle: The right ventricular size is normal. No increase in right ventricular wall thickness. Right ventricular systolic function is normal. Tricuspid regurgitation signal is inadequate for assessing PA pressure. Left Atrium: Left atrial size was normal in size. Right Atrium: Right atrial size was normal in size. Pericardium: There is no evidence of pericardial effusion. Mitral Valve: The mitral valve is degenerative in appearance. There is mild calcification of the anterior mitral valve leaflet(s). Mild mitral annular calcification. No evidence of mitral valve regurgitation. No evidence of mitral valve stenosis. Tricuspid Valve: The tricuspid valve is normal in structure. Tricuspid valve regurgitation is not demonstrated. No evidence of tricuspid stenosis. Aortic Valve: The aortic valve is tricuspid. Aortic valve regurgitation is not visualized. Aortic valve sclerosis/calcification is present, without any evidence of aortic stenosis. Aortic valve mean gradient measures 6.0 mmHg. Aortic valve peak gradient measures 11.7 mmHg. Aortic valve area, by VTI measures 1.75 cm. Pulmonic Valve: The pulmonic valve was normal in structure. Pulmonic valve regurgitation is not visualized. No evidence of pulmonic stenosis. Aorta: The aortic root is normal in size and structure. Venous: The inferior vena cava was not well visualized. IAS/Shunts: No atrial level shunt detected by color flow Doppler.  LEFT VENTRICLE PLAX  2D LVIDd:         4.00 cm   Diastology LVIDs:         2.80 cm   LV e' medial:    6.99 cm/s LV PW:         1.20 cm   LV E/e' medial:  12.8 LV IVS:        1.30 cm   LV e' lateral:   7.15 cm/s LVOT diam:     2.00 cm   LV E/e' lateral: 12.5 LV SV:         49 LV SV Index:   23 LVOT Area:     3.14 cm  RIGHT VENTRICLE RV S prime:     14.00 cm/s LEFT ATRIUM             Index        RIGHT ATRIUM          Index LA Vol (A2C):   65.5 ml 31.28 ml/m  RA Area:     9.95 cm LA Vol (A4C):   66.0 ml 31.52 ml/m  RA Volume:   20.40 ml 9.74 ml/m LA Biplane Vol: 66.9 ml 31.95 ml/m  AORTIC VALVE AV Area (Vmax):    1.68 cm AV Area (Vmean):   1.63 cm AV Area (VTI):     1.75 cm AV Vmax:           171.00 cm/s AV Vmean:          112.000 cm/s AV VTI:            0.278 m AV Peak Grad:      11.7 mmHg AV Mean Grad:      6.0 mmHg LVOT Vmax:         91.20 cm/s LVOT Vmean:        58.100 cm/s LVOT VTI:  0.155 m LVOT/AV VTI ratio: 0.56  AORTA Ao Root diam: 3.10 cm Ao Asc diam:  3.00 cm MITRAL VALVE MV Area (PHT): 4.15 cm    SHUNTS MV Decel Time: 183 msec    Systemic VTI:  0.16 m MV E velocity: 89.60 cm/s  Systemic Diam: 2.00 cm MV Gratia Disla velocity: 91.60 cm/s MV E/Wallace Gappa ratio:  0.98 Fransico Him MD Electronically signed by Fransico Him MD Signature Date/Time: 07/25/2022/10:27:43 AM    Final    DG Chest 1 View  Result Date: 07/24/2022 CLINICAL DATA:  Back pain. EXAM: CHEST  1 VIEW COMPARISON:  April 02, 2022 FINDINGS: The heart size and mediastinal contours are within normal limits. Both lungs are clear. There is mild dextroscoliosis of the lower thoracic spine. IMPRESSION: No active disease. Electronically Signed   By: Courtney Norfolk M.D.   On: 07/24/2022 18:31   DG Cervical Spine Complete  Result Date: 07/24/2022 CLINICAL DATA:  Left arm pain. EXAM: CERVICAL SPINE - COMPLETE 4+ VIEW COMPARISON:  None Available. FINDINGS: There is no evidence of cervical spine fracture or prevertebral soft tissue swelling. Alignment is normal. Mild to  moderate severity anterior endplate sclerosis and mild anterior osteophyte formation are seen at the levels of C3-C4 and C5-C6. Mild to moderate severity intervertebral disc space narrowing is also seen at C3-C4. Mild to moderate severity bilateral neural foraminal narrowing is noted at C3-C4. No other significant bone abnormalities are identified. IMPRESSION: Mild to moderate severity degenerative disc disease at C3-C4 and C5-C6. Electronically Signed   By: Courtney Norfolk M.D.   On: 07/24/2022 18:24    Microbiology: No results found for this or any previous visit (from the past 240 hour(s)).   Labs: Basic Metabolic Panel: Recent Labs  Lab 07/24/22 1716 07/26/22 0824 07/27/22 0131 07/28/22 1006  NA 138 140 138 139  K 3.8 3.9 4.0 4.2  CL 104 105 105 108  CO2 '24 23 24 '$ 21*  GLUCOSE 167* 148* 161* 154*  BUN '17 12 17 14  '$ CREATININE 1.03* 0.95 1.19* 1.06*  CALCIUM 9.4 9.3 9.3 9.1  MG  --   --  2.2 2.2  PHOS  --   --  4.3 3.8   Liver Function Tests: Recent Labs  Lab 07/26/22 0824 07/27/22 0131 07/28/22 1006  AST '17 18 18  '$ ALT '16 17 15  '$ ALKPHOS 94 92 92  BILITOT 0.4 0.5 0.6  PROT 7.1 7.5 7.4  ALBUMIN 3.5 3.4* 3.5   No results for input(s): "LIPASE", "AMYLASE" in the last 168 hours. No results for input(s): "AMMONIA" in the last 168 hours. CBC: Recent Labs  Lab 07/24/22 1716 07/26/22 0824 07/27/22 0131 07/28/22 1006  WBC 6.4 5.1 6.0 6.6  NEUTROABS  --  3.7 4.0 5.9  HGB 8.7* 9.0* 8.6* 8.6*  HCT 27.6* 28.0* 26.3* 26.3*  MCV 69.9* 69.1* 68.0* 68.5*  PLT 346 316 341 340   Cardiac Enzymes: No results for input(s): "CKTOTAL", "CKMB", "CKMBINDEX", "TROPONINI" in the last 168 hours. BNP: BNP (last 3 results) No results for input(s): "BNP" in the last 8760 hours.  ProBNP (last 3 results) No results for input(s): "PROBNP" in the last 8760 hours.  CBG: Recent Labs  Lab 07/27/22 1816 07/27/22 2121 07/28/22 0757 07/28/22 1123 07/28/22 1345  GLUCAP 93 102* 123* 150*  104*       Signed:  Fayrene Helper MD.  [facility] 07/28/2022, 5:16 PM

## 2022-07-28 NOTE — Progress Notes (Signed)
Cardiac Individual Treatment Plan  Patient Details  Name: [name] MRN: [id number] Date of Birth: Apr 02, 1966 Referring Provider:   Flowsheet Row CARDIAC REHAB PHASE II ORIENTATION from 07/14/2022 in [facility]  Referring Provider Patwardhan, Reynold Bowen, MD       Initial Encounter Date:  [place] from 07/14/2022 in [facility]  Date 07/14/22       Visit Diagnosis: 05/26/22 PTCA OM 1 - Plan: EKG - 12 lead, EKG - 12 lead  Post PTCA  Patient's Home Medications on Admission: No current facility-administered medications for this encounter. No current outpatient medications on file.  Facility-Administered Medications Ordered in Other Encounters:    0.9 %  sodium chloride infusion, 250 mL, Intravenous, PRN, [name], MD   acetaminophen (TYLENOL) tablet 650 mg, 650 mg, Oral, Q4H PRN, [name], MD   alum & mag hydroxide-simeth (MAALOX/MYLANTA) 200-200-20 MG/5ML suspension 30 mL, 30 mL, Oral, Q6H PRN, [name], MD   amLODipine (NORVASC) tablet 10 mg, 10 mg, Oral, QPM, [name], MD, 10 mg at 07/27/22 5784   aspirin chewable tablet 81 mg, 81 mg, Oral, q AM, [name], MD, 81 mg at 07/28/22 0831   atorvastatin (LIPITOR) tablet 40 mg, 40 mg, Oral, QPM, [name], MD, 40 mg at 07/27/22 1838   empagliflozin (JARDIANCE) tablet 10 mg, 10 mg, Oral, Daily, [name], MD, 10 mg at 07/28/22 0834   enoxaparin (LOVENOX) injection 40 mg, 40 mg, Subcutaneous, Q24H, [name], MD, 40 mg at 07/28/22 6962   ferrous sulfate tablet 325 mg, 325 mg, Oral, Britt Bottom, MD, 325 mg at 07/28/22 1347   insulin aspart (novoLOG) injection 0-5 Units, 0-5 Units, Subcutaneous, QHS, [name], MD, 2 Units at 07/27/22 0001   insulin aspart (novoLOG) injection 0-9 Units, 0-9 Units, Subcutaneous, TID WC, [name], MD, 1 Units at 07/28/22 9528   isosorbide mononitrate (IMDUR) 24 hr tablet 30 mg, 30 mg,  Oral, Daily, [name], MD, 30 mg at 07/28/22 0834   LORazepam (ATIVAN) injection 0.5 mg, 0.5 mg, Intravenous, PRN, [name], MD   losartan (COZAAR) tablet 25 mg, 25 mg, Oral, Daily, [name], MD, 25 mg at 07/28/22 4132   metoprolol succinate (TOPROL-XL) 24 hr tablet 100 mg, 100 mg, Oral, Daily, [name], MD, 100 mg at 07/28/22 0835   ondansetron (ZOFRAN) injection 4 mg, 4 mg, Intravenous, Q6H PRN, [name], MD   ranolazine (RANEXA) 12 hr tablet 1,000 mg, 1,000 mg, Oral, BID, [name], MD, 1,000 mg at 07/28/22 4401   sodium chloride flush (NS) 0.9 % injection 3 mL, 3 mL, Intravenous, Q12H, [name], MD, 3 mL at 07/28/22 0837   sodium chloride flush (NS) 0.9 % injection 3 mL, 3 mL, Intravenous, Q12H, [name], MD   sodium chloride flush (NS) 0.9 % injection 3 mL, 3 mL, Intravenous, PRN, [name], MD   ticagrelor (BRILINTA) tablet 90 mg, 90 mg, Oral, BID, [name], MD, 90 mg at 07/28/22 0272  Past Medical History: Past Medical History:  Diagnosis Date   Anemia    CAD (coronary artery disease)    PCI with stent to RCA in 2019   CHF (congestive heart failure) ([place]) 04/2011   EF 15-20% from echo 05/19/11   DOE (dyspnea on exertion)    DVT (deep venous thrombosis) ([place]) 06/2011   LLE   Fatigue    HTN (hypertension)    Hyperlipidemia  Menorrhagia    with iron deficient anemia   NSTEMI (non-ST elevated myocardial infarction) ([place]) 10/26/2017   NSTEMI (non-ST elevated myocardial infarction) ([place]) 10/27/2017   Obesity    Orthopnea    Type II diabetes mellitus ([facility])     Tobacco Use: Social History   Tobacco Use  Smoking Status Former   Packs/day: 0.50   Years: 10.00   Total pack years: 5.00   Types: Cigarettes   Quit date: 10/19/1993   Years since quitting: 28.7  Smokeless Tobacco Never    Labs: Review Flowsheet  More data exists      Latest Ref Rng & Units 11/16/2019 11/17/2019 03/10/2022 05/27/2022 07/27/2022  Labs for ITP Cardiac and Pulmonary Rehab   Cholestrol 0 - 200 mg/dL - 106  - 143  105   LDL (calc) 0 - 99 mg/dL - 62  - 94  58   Direct LDL 0 - 99 mg/dL - - - - 67   HDL-C >40 mg/dL - 34  - 35  33   Trlycerides <150 mg/dL - 51  - 69  71   Hemoglobin A1c 4.8 - 5.6 % 8.6  - 8.7  - -    Capillary Blood Glucose: Lab Results  Component Value Date   GLUCAP 104 (H) 07/28/2022   GLUCAP 150 (H) 07/28/2022   GLUCAP 123 (H) 07/28/2022   GLUCAP 102 (H) 07/27/2022   GLUCAP 93 07/27/2022     Exercise Target Goals: Exercise Program Goal: Individual exercise prescription set using results from initial 6 min walk test and THRR while considering  patient's activity barriers and safety.   Exercise Prescription Goal: Initial exercise prescription builds to 30-45 minutes a day of aerobic activity, 2-3 days per week.  Home exercise guidelines will be given to patient during program as part of exercise prescription that the participant will acknowledge.  Activity Barriers & Risk Stratification:  Activity Barriers & Cardiac Risk Stratification - 07/14/22 1430       Activity Barriers & Cardiac Risk Stratification   Activity Barriers Shortness of Breath;Other (comment)    Comments Left shoulder pain, limited ROM. SOB going up hills/inclines.    Cardiac Risk Stratification High             6 Minute Walk:  6 Minute Walk     Row Name 07/14/22 1518         6 Minute Walk   Phase Initial     Distance 1058 feet     Walk Time 6 minutes     # of Rest Breaks 0     MPH 2     METS 2.56     RPE 7     Perceived Dyspnea  0     VO2 Peak 8.95     Symptoms Yes (comment)     Comments Patient c/o right sided chest and mid back discomfort, "5/10" on the pain scale, resolved with rest.     Resting HR 94 bpm     Resting BP 122/76     Resting Oxygen Saturation  100 %     Exercise Oxygen Saturation  during 6 min walk 100 %     Max Ex. HR 117 bpm     Max Ex. BP 148/82     2 Minute Post BP 128/78              Oxygen Initial  Assessment:   Oxygen Re-Evaluation:   Oxygen Discharge (Final Oxygen Re-Evaluation):   Initial Exercise  Prescription:  Initial Exercise Prescription - 07/14/22 1500       Date of Initial Exercise RX and Referring Provider   Date 07/14/22    Referring Provider [name], MD    Expected Discharge Date 08/26/22      Treadmill   MPH 2    Grade 0    Minutes 15    METs 2.53      NuStep   Level 2    SPM 85    Minutes 15    METs 2.4      Prescription Details   Frequency (times per week) 3    Duration Progress to 30 minutes of continuous aerobic without signs/symptoms of physical distress      Intensity   THRR 40-80% of Max Heartrate 66-133    Ratings of Perceived Exertion 11-13    Perceived Dyspnea 0-4      Progression   Progression Continue to progress workloads to maintain intensity without signs/symptoms of physical distress.      Resistance Training   Training Prescription Yes    Weight 3 lbs    Reps 10-15             Perform Capillary Blood Glucose checks as needed.  Exercise Prescription Changes:   Exercise Prescription Changes     Row Name 07/20/22 1617             Response to Exercise   Blood Pressure (Admit) 126/84       Blood Pressure (Exercise) 124/84       Blood Pressure (Exit) 114/72       Heart Rate (Admit) 84 bpm       Heart Rate (Exercise) 120 bpm       Heart Rate (Exit) 86 bpm       Symptoms 0       Comments Pt first day in the CRP2 program       Duration Progress to 30 minutes of  aerobic without signs/symptoms of physical distress       Intensity THRR New         Progression   Progression Continue to progress workloads to maintain intensity without signs/symptoms of physical distress.         Resistance Training   Training Prescription Yes       Weight 3 lbs       Reps 10-15       Time 1.97 Minutes         Treadmill   MPH 2  1.4 speed after 5 mins at level 2. MET's 2.07       Grade 0       Minutes 15        METs 2.53         NuStep   Level 2       SPM 85       Minutes 15       METs 1.3                Exercise Comments:   Exercise Comments     Row Name 07/20/22 1624           Exercise Comments Pt first day in the CRP2 program. Pt tolerated exercise well with anaverage MET level of 1.97. Pt has a lengthy conversation with the RD and is feeling good about her first day. Will work on pt learning her THRR, RPE and ExRx next time  Exercise Goals and Review:   Exercise Goals     Row Name 07/14/22 1430             Exercise Goals   Increase Physical Activity Yes       Intervention Provide advice, education, support and counseling about physical activity/exercise needs.;Develop an individualized exercise prescription for aerobic and resistive training based on initial evaluation findings, risk stratification, comorbidities and participant's personal goals.       Expected Outcomes Short Term: Attend rehab on a regular basis to increase amount of physical activity.;Long Term: Exercising regularly at least 3-5 days a week.;Long Term: Add in home exercise to make exercise part of routine and to increase amount of physical activity.       Increase Strength and Stamina Yes       Intervention Provide advice, education, support and counseling about physical activity/exercise needs.;Develop an individualized exercise prescription for aerobic and resistive training based on initial evaluation findings, risk stratification, comorbidities and participant's personal goals.       Expected Outcomes Short Term: Increase workloads from initial exercise prescription for resistance, speed, and METs.;Short Term: Perform resistance training exercises routinely during rehab and add in resistance training at home;Long Term: Improve cardiorespiratory fitness, muscular endurance and strength as measured by increased METs and functional capacity (6MWT)       Able to understand and use rate of  perceived exertion (RPE) scale Yes       Intervention Provide education and explanation on how to use RPE scale       Expected Outcomes Short Term: Able to use RPE daily in rehab to express subjective intensity level;Long Term:  Able to use RPE to guide intensity level when exercising independently       Knowledge and understanding of Target Heart Rate Range (THRR) Yes       Intervention Provide education and explanation of THRR including how the numbers were predicted and where they are located for reference       Expected Outcomes Short Term: Able to state/look up THRR;Long Term: Able to use THRR to govern intensity when exercising independently;Short Term: Able to use daily as guideline for intensity in rehab       Able to check pulse independently Yes       Intervention Provide education and demonstration on how to check pulse in carotid and radial arteries.;Review the importance of being able to check your own pulse for safety during independent exercise       Expected Outcomes Short Term: Able to explain why pulse checking is important during independent exercise;Long Term: Able to check pulse independently and accurately       Understanding of Exercise Prescription Yes       Intervention Provide education, explanation, and written materials on patient's individual exercise prescription       Expected Outcomes Short Term: Able to explain program exercise prescription;Long Term: Able to explain home exercise prescription to exercise independently                Exercise Goals Re-Evaluation :  Exercise Goals Re-Evaluation     Row Name 07/20/22 1622             Exercise Goal Re-Evaluation   Exercise Goals Review Increase Physical Activity;Increase Strength and Stamina;Able to understand and use rate of perceived exertion (RPE) scale;Knowledge and understanding of Target Heart Rate Range (THRR);Understanding of Exercise Prescription       Comments Pt first day in the [facility] program. Pt  tolerated exercise well with anaverage MET level of 1.97. Pt has a lengthy conversation with the RD and is feeling good about her first day. Will work on pt learning her THRR, RPE and ExRx next time       Expected Outcomes Will continue to monitor pt and progress workloads as tolerated without sign or symptom                Discharge Exercise Prescription (Final Exercise Prescription Changes):  Exercise Prescription Changes - 07/20/22 1617       Response to Exercise   Blood Pressure (Admit) 126/84    Blood Pressure (Exercise) 124/84    Blood Pressure (Exit) 114/72    Heart Rate (Admit) 84 bpm    Heart Rate (Exercise) 120 bpm    Heart Rate (Exit) 86 bpm    Symptoms 0    Comments Pt first day in the CRP2 program    Duration Progress to 30 minutes of  aerobic without signs/symptoms of physical distress    Intensity THRR New      Progression   Progression Continue to progress workloads to maintain intensity without signs/symptoms of physical distress.      Resistance Training   Training Prescription Yes    Weight 3 lbs    Reps 10-15    Time 1.97 Minutes      Treadmill   MPH 2   1.4 speed after 5 mins at level 2. MET's 2.07   Grade 0    Minutes 15    METs 2.53      NuStep   Level 2    SPM 85    Minutes 15    METs 1.3             Nutrition:  Target Goals: Understanding of nutrition guidelines, daily intake of sodium '1500mg'$ , cholesterol '200mg'$ , calories 30% from fat and 7% or less from saturated fats, daily to have 5 or more servings of fruits and vegetables.  Biometrics:  Pre Biometrics - 07/14/22 1335       Pre Biometrics   Waist Circumference 54 inches    Hip Circumference 57 inches    Waist to Hip Ratio 0.95 %    Triceps Skinfold 60 mm    % Body Fat 61.1 %    Grip Strength 32 kg    Flexibility 12.13 in    Single Leg Stand 30 seconds              Nutrition Therapy Plan and Nutrition Goals:  Nutrition Therapy & Goals - 07/20/22 1550        Nutrition Therapy   Diet Heart Healthy/Carbohydrate Consistent    Drug/Food Interactions Statins/Certain Fruits      Personal Nutrition Goals   Nutrition Goal Patient to identify strategies for managing cardiovascular risk by attending the weekly [employer] and nutrition courses    Personal Goal #2 Patient to use the plate method as a guide for meal planning to include lean protein/plant protein, fruit, vegetables, whole grains, nonfat dairy to aid with blood sugar control and heart health.    Personal Goal #3 Patient to reduce sodium intake to '1500mg'$  per day    Personal Goal #4 Patient to identify food sources and limit daily intake of saturated fat, trans fat, sodium, and refined carbohydrates    Comments Catrena is extremely motivated to make lifestyle changes to aid with heart health. She has nearly eliminated eating out and is cooking more at home. She has eliminated  red meat, pork and implemented more fish and plant based protein sources. Her blood sugar does remain high in the mornings per patient >200. We did discuss strategies for blood sugar control including using the plate method as a guide for meal planning and high protein/high fiber combinations of foods.      Intervention Plan   Intervention Prescribe, educate and counsel regarding individualized specific dietary modifications aiming towards targeted core components such as weight, hypertension, lipid management, diabetes, heart failure and other comorbidities.;Nutrition handout(s) given to patient.    Expected Outcomes Short Term Goal: Understand basic principles of dietary content, such as calories, fat, sodium, cholesterol and nutrients.;Long Term Goal: Adherence to prescribed nutrition plan.             Nutrition Assessments:  Nutrition Assessments - 07/23/22 0851       Rate Your Plate Scores   Pre Score 71            MEDIFICTS Score Key: ?70 Need to make dietary changes  40-70 Heart Healthy Diet ? 40  Therapeutic Level Cholesterol Diet   Flowsheet Row CARDIAC REHAB PHASE II EXERCISE from 07/22/2022 in [facility]  Picture Your Plate Total Score on Admission 71      Picture Your Plate Scores: <92 Unhealthy dietary pattern with much room for improvement. 41-50 Dietary pattern unlikely to meet recommendations for good health and room for improvement. 51-60 More healthful dietary pattern, with some room for improvement.  >60 Healthy dietary pattern, although there may be some specific behaviors that could be improved.    Nutrition Goals Re-Evaluation:  Nutrition Goals Re-Evaluation     [place] Name 07/20/22 1550             Goals   Current Weight 256 lb 2.8 oz (116.2 kg)       Comment Lipid panel improved, HDL remains low 35, Lipoprotein A 196.2, A1c 8.7       Expected Outcome Zamaria is extremely motivated to make lifestyle changes to aid with heart health. She has nearly eliminated eating out and is cooking more at home. She has eliminated red meat, pork and implemented more fish and plant based protein sources. Her blood sugar does remain high in the mornings per patient >200. We did discuss strategies for blood sugar control including using the plate method as a guide for meal planning and high protein/high fiber combinations of foods.                Nutrition Goals Re-Evaluation:  Nutrition Goals Re-Evaluation     [place] Name 07/20/22 1550             Goals   Current Weight 256 lb 2.8 oz (116.2 kg)       Comment Lipid panel improved, HDL remains low 35, Lipoprotein A 196.2, A1c 8.7       Expected Outcome Briani is extremely motivated to make lifestyle changes to aid with heart health. She has nearly eliminated eating out and is cooking more at home. She has eliminated red meat, pork and implemented more fish and plant based protein sources. Her blood sugar does remain high in the mornings per patient >200. We did discuss strategies for blood  sugar control including using the plate method as a guide for meal planning and high protein/high fiber combinations of foods.                Nutrition Goals Discharge (Final Nutrition Goals Re-Evaluation):  Nutrition  Goals Re-Evaluation - 07/20/22 1550       Goals   Current Weight 256 lb 2.8 oz (116.2 kg)    Comment Lipid panel improved, HDL remains low 35, Lipoprotein A 196.2, A1c 8.7    Expected Outcome Kierstin is extremely motivated to make lifestyle changes to aid with heart health. She has nearly eliminated eating out and is cooking more at home. She has eliminated red meat, pork and implemented more fish and plant based protein sources. Her blood sugar does remain high in the mornings per patient >200. We did discuss strategies for blood sugar control including using the plate method as a guide for meal planning and high protein/high fiber combinations of foods.             Psychosocial: Target Goals: Acknowledge presence or absence of significant depression and/or stress, maximize coping skills, provide positive support system. Participant is able to verbalize types and ability to use techniques and skills needed for reducing stress and depression.  Initial Review & Psychosocial Screening:  Initial Psych Review & Screening - 07/14/22 1612       Initial Review   Current issues with Current Stress Concerns    Source of Stress Concerns Financial;Chronic Illness    Comments Keayra has financial stressors as she is not working currently.      Family Dynamics   Good Support System? Yes   Crystalmarie has her 3 daughter and her dog for support     Barriers   Psychosocial barriers to participate in program The patient should benefit from training in stress management and relaxation.      Screening Interventions   Interventions Encouraged to exercise    Expected Outcomes Long Term Goal: Stressors or current issues are controlled or eliminated.             Quality of Life  Scores:  Quality of Life - 07/14/22 1557       Quality of Life   Select Quality of Life      Quality of Life Scores   Health/Function Pre 26.63 %    Socioeconomic Pre 22.13 %    Psych/Spiritual Pre 28.07 %    Family Pre 30 %    GLOBAL Pre 26.37 %            Scores of 19 and below usually indicate a poorer quality of life in these areas.  A difference of  2-3 points is a clinically meaningful difference.  A difference of 2-3 points in the total score of the Quality of Life Index has been associated with significant improvement in overall quality of life, self-image, physical symptoms, and general health in studies assessing change in quality of life.  PHQ-9: Review Flowsheet       07/14/2022 04/02/2022 12/22/2017  Depression screen PHQ 2/9  Decreased Interest 0 3 0  Down, Depressed, Hopeless 0 0 0  PHQ - 2 Score 0 3 0  Altered sleeping - 3 -  Tired, decreased energy - 3 -  Change in appetite - 1 -  Feeling bad or failure about yourself  - 0 -  Trouble concentrating - 0 -  Moving slowly or fidgety/restless - 3 -  Suicidal thoughts - 0 -  PHQ-9 Score - 13 -  Difficult doing work/chores - Very difficult -   Interpretation of Total Score  Total Score Depression Severity:  1-4 = Minimal depression, 5-9 = Mild depression, 10-14 = Moderate depression, 15-19 = Moderately severe depression, 20-27 = Severe  depression   Psychosocial Evaluation and Intervention:   Psychosocial Re-Evaluation:  Psychosocial Re-Evaluation     Row Name 07/20/22 1536 07/28/22 1541           Psychosocial Re-Evaluation   Current issues with Current Stress Concerns Current Stress Concerns      Comments Mesha has financial stressors has she does not have any income coming in currently as she lives with her daughter. Kanchan plans to return her vocational rehab application this week. Gaige has financial stressors has she does not have any income coming in currently as she lives with her daughter.  Rudell says she is not interested in vocational rehab at this time. Exercise is currently on hold.      Expected Outcomes Lilyanna will have decreased or comtrolled stress upon completion of traditional cardiac rehab Tylynn will have decreased or comtrolled stress upon completion of traditional cardiac rehab      Interventions Stress management education;Encouraged to attend Cardiac Rehabilitation for the exercise;Relaxation education Stress management education;Encouraged to attend Cardiac Rehabilitation for the exercise;Relaxation education      Continue Psychosocial Services  Follow up required by staff Follow up required by staff        Initial Review   Source of Stress Concerns Chronic Illness;Financial;Unable to participate in former interests or hobbies;Unable to perform yard/household activities Chronic Illness;Financial;Unable to participate in former interests or hobbies;Unable to perform yard/household activities      Comments Will continue to monitor and offer support as needed. Will continue to monitor and offer support as needed.               Psychosocial Discharge (Final Psychosocial Re-Evaluation):  Psychosocial Re-Evaluation - 07/28/22 1541       Psychosocial Re-Evaluation   Current issues with Current Stress Concerns    Comments Velena has financial stressors has she does not have any income coming in currently as she lives with her daughter. Lashayla says she is not interested in vocational rehab at this time. Exercise is currently on hold.    Expected Outcomes Jolanda will have decreased or comtrolled stress upon completion of traditional cardiac rehab    Interventions Stress management education;Encouraged to attend Cardiac Rehabilitation for the exercise;Relaxation education    Continue Psychosocial Services  Follow up required by staff      Initial Review   Source of Stress Concerns Chronic Illness;Financial;Unable to participate in former interests or  hobbies;Unable to perform yard/household activities    Comments Will continue to monitor and offer support as needed.             Vocational Rehabilitation: Provide vocational rehab assistance to qualifying candidates.   Vocational Rehab Evaluation & Intervention:  Vocational Rehab - 07/14/22 1616       Initial Vocational Rehab Evaluation & Intervention   Assessment shows need for Vocational Rehabilitation Yes   Ayleen is currently unemployed. Given vocational rehab packet to review and return next week   Vocational Rehab Packet given to patient 07/14/22             Education: Education Goals: Education classes will be provided on a weekly basis, covering required topics. Participant will state understanding/return demonstration of topics presented.    Education     Row Name 07/20/22 1500     Education   Cardiac Education Topics [employer]   Licensed conveyancer Nutrition   Nutrition Dining Out - Part 1  Instruction Review Code 1- Verbalizes Understanding   Class Start Time 1358   Class Stop Time 1445   Class Time Calculation (min) 47 min    [place] Name 07/22/22 1500     Education   Cardiac Education Topics [place]   Educator Dietitian   Weekly Topic One-Pot Wonders   Instruction Review Code 1- Verbalizes Understanding   Class Start Time 3709   Class Stop Time 1440   Class Time Calculation (min) 53 min    [place] Name 07/24/22 1500     Education   Cardiac Education Topics Pritikin   Environmental consultant Psychosocial   Psychosocial Workshop New Thoughts, New Behaviors   Instruction Review Code 1- Verbalizes Understanding   Class Start Time 1355   Class Stop Time 1447   Class Time Calculation (min) 52 min            Core Videos: Exercise    Move It!  Clinical staff conducted group or individual video  education with verbal and written material and guidebook.  Patient learns the recommended Pritikin exercise program. Exercise with the goal of living a long, healthy life. Some of the health benefits of exercise include controlled diabetes, healthier blood pressure levels, improved cholesterol levels, improved heart and lung capacity, improved sleep, and better body composition. Everyone should speak with their doctor before starting or changing an exercise routine.  Biomechanical Limitations Clinical staff conducted group or individual video education with verbal and written material and guidebook.  Patient learns how biomechanical limitations can impact exercise and how we can mitigate and possibly overcome limitations to have an impactful and balanced exercise routine.  Body Composition Clinical staff conducted group or individual video education with verbal and written material and guidebook.  Patient learns that body composition (ratio of muscle mass to fat mass) is a key component to assessing overall fitness, rather than body weight alone. Increased fat mass, especially visceral belly fat, can put [place] at increased risk for metabolic syndrome, type 2 diabetes, heart disease, and even death. It is recommended to combine diet and exercise (cardiovascular and resistance training) to improve your body composition. Seek guidance from your physician and exercise physiologist before implementing an exercise routine.  Exercise Action Plan Clinical staff conducted group or individual video education with verbal and written material and guidebook.  Patient learns the recommended strategies to achieve and enjoy long-term exercise adherence, including variety, self-motivation, self-efficacy, and positive decision making. Benefits of exercise include fitness, good health, weight management, more energy, better sleep, less stress, and overall well-being.  Medical   Heart Disease Risk Reduction Clinical staff  conducted group or individual video education with verbal and written material and guidebook.  Patient learns our heart is our most vital organ as it circulates oxygen, nutrients, white blood cells, and hormones throughout the entire body, and carries waste away. Data supports a plant-based eating plan like the Pritikin Program for its effectiveness in slowing progression of and reversing heart disease. The video provides a number of recommendations to address heart disease.   Metabolic Syndrome and Belly Fat  Clinical staff conducted group or individual video education with verbal and written material and guidebook.  Patient learns what metabolic syndrome is, how it leads to heart disease, and how one can reverse it and keep it from coming back. You have metabolic syndrome if you have 3  of the following 5 criteria: abdominal obesity, high blood pressure, high triglycerides, low HDL cholesterol, and high blood sugar.  Hypertension and Heart Disease Clinical staff conducted group or individual video education with verbal and written material and guidebook.  Patient learns that high blood pressure, or hypertension, is very common in the [place]. Hypertension is largely due to excessive salt intake, but other important risk factors include being overweight, physical inactivity, drinking too much alcohol, smoking, and not eating enough potassium from fruits and vegetables. High blood pressure is a leading risk factor for heart attack, stroke, congestive heart failure, dementia, kidney failure, and premature death. Long-term effects of excessive salt intake include stiffening of the arteries and thickening of heart muscle and organ damage. Recommendations include ways to reduce hypertension and the risk of heart disease.  Diseases of Our Time - Focusing on Diabetes Clinical staff conducted group or individual video education with verbal and written material and guidebook.  Patient learns why the best  way to stop diseases of our time is prevention, through food and other lifestyle changes. Medicine (such as prescription pills and surgeries) is often only a Band-Aid on the problem, not a long-term solution. Most common diseases of our time include obesity, type 2 diabetes, hypertension, heart disease, and cancer. The Pritikin Program is recommended and has been proven to help reduce, reverse, and/or prevent the damaging effects of metabolic syndrome.  Nutrition   Overview of the Pritikin Eating Plan  Clinical staff conducted group or individual video education with verbal and written material and guidebook.  Patient learns about the [place] for disease risk reduction. The [place] emphasizes a wide variety of unrefined, minimally-processed carbohydrates, like fruits, vegetables, whole grains, and legumes. Go, Caution, and Stop food choices are explained. Plant-based and lean animal proteins are emphasized. Rationale provided for low sodium intake for blood pressure control, low added sugars for blood sugar stabilization, and low added fats and oils for coronary artery disease risk reduction and weight management.  Calorie Density  Clinical staff conducted group or individual video education with verbal and written material and guidebook.  Patient learns about calorie density and how it impacts the Pritikin Eating Plan. Knowing the characteristics of the food you choose will help you decide whether those foods will lead to weight gain or weight loss, and whether you want to consume more or less of them. Weight loss is usually a side effect of the Pritikin Eating Plan because of its focus on low calorie-dense foods.  Label Reading  Clinical staff conducted group or individual video education with verbal and written material and guidebook.  Patient learns about the Pritikin recommended label reading guidelines and corresponding recommendations regarding calorie density, added  sugars, sodium content, and whole grains.  Dining Out - Part 1  Clinical staff conducted group or individual video education with verbal and written material and guidebook.  Patient learns that restaurant meals can be sabotaging because they can be so high in calories, fat, sodium, and/or sugar. Patient learns recommended strategies on how to positively address this and avoid unhealthy pitfalls.  Facts on Fats  Clinical staff conducted group or individual video education with verbal and written material and guidebook.  Patient learns that lifestyle modifications can be just as effective, if not more so, as many medications for lowering your risk of heart disease. A Pritikin lifestyle can help to reduce your risk of inflammation and atherosclerosis (cholesterol build-up, or plaque, in the artery walls). Lifestyle interventions  such as dietary choices and physical activity address the cause of atherosclerosis. A review of the types of fats and their impact on blood cholesterol levels, along with dietary recommendations to reduce fat intake is also included.  Nutrition Action Plan  Clinical staff conducted group or individual video education with verbal and written material and guidebook.  Patient learns how to incorporate Pritikin recommendations into their lifestyle. Recommendations include planning and keeping personal health goals in mind as an important part of their success.  Healthy Mind-Set    Healthy Minds, Bodies, Hearts  Clinical staff conducted group or individual video education with verbal and written material and guidebook.  Patient learns how to identify when they are stressed. Video will discuss the impact of that stress, as well as the many benefits of stress management. Patient will also be introduced to stress management techniques. The way we think, act, and feel has an impact on our hearts.  How Our Thoughts Can Heal Our Hearts  Clinical staff conducted group or individual  video education with verbal and written material and guidebook.  Patient learns that negative thoughts can cause depression and anxiety. This can result in negative lifestyle behavior and serious health problems. Cognitive behavioral therapy is an effective method to help control our thoughts in order to change and improve our emotional outlook.  Additional Videos:  Exercise    Improving Performance  Clinical staff conducted group or individual video education with verbal and written material and guidebook.  Patient learns to use a non-linear approach by alternating intensity levels and lengths of time spent exercising to help burn more calories and lose more body fat. Cardiovascular exercise helps improve heart health, metabolism, hormonal balance, blood sugar control, and recovery from fatigue. Resistance training improves strength, endurance, balance, coordination, reaction time, metabolism, and muscle mass. Flexibility exercise improves circulation, posture, and balance. Seek guidance from your physician and exercise physiologist before implementing an exercise routine and learn your capabilities and proper form for all exercise.  Introduction to Yoga  Clinical staff conducted group or individual video education with verbal and written material and guidebook.  Patient learns about yoga, a discipline of the coming together of mind, breath, and body. The benefits of yoga include improved flexibility, improved range of motion, better posture and core strength, increased lung function, weight loss, and positive self-image. Yoga's heart health benefits include lowered blood pressure, healthier heart rate, decreased cholesterol and triglyceride levels, improved immune function, and reduced stress. Seek guidance from your physician and exercise physiologist before implementing an exercise routine and learn your capabilities and proper form for all exercise.  Medical   Aging: Enhancing Your Quality of  Life  Clinical staff conducted group or individual video education with verbal and written material and guidebook.  Patient learns key strategies and recommendations to stay in good physical health and enhance quality of life, such as prevention strategies, having an advocate, securing a [place], and keeping a list of medications and system for tracking them. It also discusses how to avoid risk for bone loss.  Biology of Weight Control  Clinical staff conducted group or individual video education with verbal and written material and guidebook.  Patient learns that weight gain occurs because we consume more calories than we burn (eating more, moving less). Even if your body weight is normal, you may have higher ratios of fat compared to muscle mass. Too much body fat puts you at increased risk for cardiovascular disease, heart attack,  stroke, type 2 diabetes, and obesity-related cancers. In addition to exercise, following the [place] can help reduce your risk.  Decoding Lab Results  Clinical staff conducted group or individual video education with verbal and written material and guidebook.  Patient learns that lab test reflects one measurement whose values change over time and are influenced by many factors, including medication, stress, sleep, exercise, food, hydration, pre-existing medical conditions, and more. It is recommended to use the knowledge from this video to become more involved with your lab results and evaluate your numbers to speak with your doctor.   Diseases of Our Time - Overview  Clinical staff conducted group or individual video education with verbal and written material and guidebook.  Patient learns that according to the CDC, 50% to 70% of chronic diseases (such as obesity, type 2 diabetes, elevated lipids, hypertension, and heart disease) are avoidable through lifestyle improvements including healthier food choices, listening to  satiety cues, and increased physical activity.  Sleep Disorders Clinical staff conducted group or individual video education with verbal and written material and guidebook.  Patient learns how good quality and duration of sleep are important to overall health and well-being. Patient also learns about sleep disorders and how they impact health along with recommendations to address them, including discussing with a physician.  Nutrition  Dining Out - Part 2 Clinical staff conducted group or individual video education with verbal and written material and guidebook.  Patient learns how to plan ahead and communicate in order to maximize their dining experience in a healthy and nutritious manner. Included are recommended food choices based on the type of restaurant the patient is visiting.   Fueling a Best boy conducted group or individual video education with verbal and written material and guidebook.  There is a strong connection between our food choices and our health. Diseases like obesity and type 2 diabetes are very prevalent and are in large-part due to lifestyle choices. The Pritikin Eating Plan provides plenty of food and hunger-curbing satisfaction. It is easy to follow, affordable, and helps reduce health risks.  Menu Workshop  Clinical staff conducted group or individual video education with verbal and written material and guidebook.  Patient learns that restaurant meals can sabotage health goals because they are often packed with calories, fat, sodium, and sugar. Recommendations include strategies to plan ahead and to communicate with the manager, chef, or server to help order a healthier meal.  Planning Your Eating Strategy  Clinical staff conducted group or individual video education with verbal and written material and guidebook.  Patient learns about the [place] and its benefit of reducing the risk of disease. The [place] does not focus on  calories. Instead, it emphasizes high-quality, nutrient-rich foods. By knowing the characteristics of the foods, we choose, we can determine their calorie density and make informed decisions.  Targeting Your Nutrition Priorities  Clinical staff conducted group or individual video education with verbal and written material and guidebook.  Patient learns that lifestyle habits have a tremendous impact on disease risk and progression. This video provides eating and physical activity recommendations based on your personal health goals, such as reducing LDL cholesterol, losing weight, preventing or controlling type 2 diabetes, and reducing high blood pressure.  Vitamins and Minerals  Clinical staff conducted group or individual video education with verbal and written material and guidebook.  Patient learns different ways to obtain key vitamins and minerals, including through a recommended healthy diet. It  is important to discuss all supplements you take with your doctor.   Healthy Mind-Set    Smoking Cessation  Clinical staff conducted group or individual video education with verbal and written material and guidebook.  Patient learns that cigarette smoking and tobacco addiction pose a serious health risk which affects millions of people. Stopping smoking will significantly reduce the risk of heart disease, lung disease, and many forms of cancer. Recommended strategies for quitting are covered, including working with your doctor to develop a successful plan.  Culinary   Becoming a Financial trader conducted group or individual video education with verbal and written material and guidebook.  Patient learns that cooking at home can be healthy, cost-effective, quick, and puts them in control. Keys to cooking healthy recipes will include looking at your recipe, assessing your equipment needs, planning ahead, making it simple, choosing cost-effective seasonal ingredients, and limiting the use of  added fats, salts, and sugars.  Cooking - Breakfast and Snacks  Clinical staff conducted group or individual video education with verbal and written material and guidebook.  Patient learns how important breakfast is to satiety and nutrition through the entire day. Recommendations include key foods to eat during breakfast to help stabilize blood sugar levels and to prevent overeating at meals later in the day. Planning ahead is also a key component.  Cooking - Human resources officer conducted group or individual video education with verbal and written material and guidebook.  Patient learns eating strategies to improve overall health, including an approach to cook more at home. Recommendations include thinking of animal protein as a side on your plate rather than center stage and focusing instead on lower calorie dense options like vegetables, fruits, whole grains, and plant-based proteins, such as beans. Making sauces in large quantities to freeze for later and leaving the skin on your vegetables are also recommended to maximize your experience.  Cooking - Healthy Salads and Dressing Clinical staff conducted group or individual video education with verbal and written material and guidebook.  Patient learns that vegetables, fruits, whole grains, and legumes are the foundations of the [place]. Recommendations include how to incorporate each of these in flavorful and healthy salads, and how to create homemade salad dressings. Proper handling of ingredients is also covered. Cooking - Soups and [employer] - Soups and Desserts Clinical staff conducted group or individual video education with verbal and written material and guidebook.  Patient learns that Pritikin soups and desserts make for easy, nutritious, and delicious snacks and meal components that are low in sodium, fat, sugar, and calorie density, while high in vitamins, minerals, and filling fiber. Recommendations  include simple and healthy ideas for soups and desserts.   Overview     The Pritikin Solution Program Overview Clinical staff conducted group or individual video education with verbal and written material and guidebook.  Patient learns that the results of the [place] Program have been documented in more than 100 articles published in peer-reviewed journals, and the benefits include reducing risk factors for (and, in some cases, even reversing) high cholesterol, high blood pressure, type 2 diabetes, obesity, and more! An overview of the three key pillars of the Pritikin Program will be covered: eating well, doing regular exercise, and having a healthy mind-set.  WORKSHOPS  Exercise: Exercise Basics: Building Your Action Plan Clinical staff led group instruction and group discussion with PowerPoint presentation and patient guidebook. To enhance the learning environment the use of posters,  models and videos may be added. At the conclusion of this workshop, patients will comprehend the difference between physical activity and exercise, as well as the benefits of incorporating both, into their routine. Patients will understand the FITT (Frequency, Intensity, Time, and Type) principle and how to use it to build an exercise action plan. In addition, safety concerns and other considerations for exercise and cardiac rehab will be addressed by the presenter. The purpose of this lesson is to promote a comprehensive and effective weekly exercise routine in order to improve patients' overall level of fitness.   Managing Heart Disease: Your Path to a Healthier Heart Clinical staff led group instruction and group discussion with PowerPoint presentation and patient guidebook. To enhance the learning environment the use of posters, models and videos may be added.At the conclusion of this workshop, patients will understand the anatomy and physiology of the heart. Additionally, they will understand how  Pritikin's three pillars impact the risk factors, the progression, and the management of heart disease.  The purpose of this lesson is to provide a high-level overview of the heart, heart disease, and how the Pritikin lifestyle positively impacts risk factors.  Exercise Biomechanics Clinical staff led group instruction and group discussion with PowerPoint presentation and patient guidebook. To enhance the learning environment the use of posters, models and videos may be added. Patients will learn how the structural parts of their bodies function and how these functions impact their daily activities, movement, and exercise. Patients will learn how to promote a neutral spine, learn how to manage pain, and identify ways to improve their physical movement in order to promote healthy living. The purpose of this lesson is to expose patients to common physical limitations that impact physical activity. Participants will learn practical ways to adapt and manage aches and pains, and to minimize their effect on regular exercise. Patients will learn how to maintain good posture while sitting, walking, and lifting.  Balance Training and Fall Prevention  Clinical staff led group instruction and group discussion with PowerPoint presentation and patient guidebook. To enhance the learning environment the use of posters, models and videos may be added. At the conclusion of this workshop, patients will understand the importance of their sensorimotor skills (vision, proprioception, and the vestibular system) in maintaining their ability to balance as they age. Patients will apply a variety of balancing exercises that are appropriate for their current level of function. Patients will understand the common causes for poor balance, possible solutions to these problems, and ways to modify their physical environment in order to minimize their fall risk. The purpose of this lesson is to teach patients about the  importance of maintaining balance as they age and ways to minimize their risk of falling.  WORKSHOPS   Nutrition:  Fueling a Scientist, research (physical sciences) led group instruction and group discussion with PowerPoint presentation and patient guidebook. To enhance the learning environment the use of posters, models and videos may be added. Patients will review the foundational principles of the [place] and understand what constitutes a serving size in each of the food groups. Patients will also learn Pritikin-friendly foods that are better choices when away from home and review make-ahead meal and snack options. Calorie density will be reviewed and applied to three nutrition priorities: weight maintenance, weight loss, and weight gain. The purpose of this lesson is to reinforce (in a group setting) the key concepts around what patients are recommended to eat and how to apply these guidelines when  away from home by planning and selecting Pritikin-friendly options. Patients will understand how calorie density may be adjusted for different weight management goals.  Mindful Eating  Clinical staff led group instruction and group discussion with PowerPoint presentation and patient guidebook. To enhance the learning environment the use of posters, models and videos may be added. Patients will briefly review the concepts of the [place] and the importance of low-calorie dense foods. The concept of mindful eating will be introduced as well as the importance of paying attention to internal hunger signals. Triggers for non-hunger eating and techniques for dealing with triggers will be explored. The purpose of this lesson is to provide patients with the opportunity to review the basic principles of the  [place], discuss the value of eating mindfully and how to measure internal cues of hunger and fullness using the Hunger Scale. Patients will also discuss reasons for non-hunger eating and  learn strategies to use for controlling emotional eating.  Targeting Your Nutrition Priorities Clinical staff led group instruction and group discussion with PowerPoint presentation and patient guidebook. To enhance the learning environment the use of posters, models and videos may be added. Patients will learn how to determine their genetic susceptibility to disease by reviewing their family history. Patients will gain insight into the importance of diet as part of an overall healthy lifestyle in mitigating the impact of genetics and other environmental insults. The purpose of this lesson is to provide patients with the opportunity to assess their personal nutrition priorities by looking at their family history, their own health history and current risk factors. Patients will also be able to discuss ways of prioritizing and modifying the [place] for their highest risk areas  Menu  Clinical staff led group instruction and group discussion with PowerPoint presentation and patient guidebook. To enhance the learning environment the use of posters, models and videos may be added. Using menus brought in from [employer], or printed from [employer], patients will apply the [place] dining out guidelines that were presented in the [employer] video. Patients will also be able to practice these guidelines in a variety of provided scenarios. The purpose of this lesson is to provide patients with the opportunity to practice hands-on learning of the [place] with actual menus and practice scenarios.  Label Reading Clinical staff led group instruction and group discussion with PowerPoint presentation and patient guidebook. To enhance the learning environment the use of posters, models and videos may be added. Patients will review and discuss the [facility] label reading guidelines presented in [employer]'s Label Reading Educational series video. Using fool  labels brought in from local grocery stores and markets, patients will apply the label reading guidelines and determine if the packaged food meet the [facility] guidelines. The purpose of this lesson is to provide patients with the opportunity to review, discuss, and practice hands-on learning of the [facility] Label Reading guidelines with actual packaged food labels. [place] Workshops are designed to teach patients ways to prepare quick, simple, and affordable recipes at home. The importance of nutrition's role in chronic disease risk reduction is reflected in its emphasis in the overall [facility] program. By learning how to prepare essential core [facility] Eating Plan recipes, patients will increase control over what they eat; be able to customize the flavor of foods without the use of added salt, sugar, or fat; and improve the quality of the food they consume. By learning a  set of core recipes which are easily assembled, quickly prepared, and affordable, patients are more likely to prepare more healthy foods at home. These workshops focus on convenient breakfasts, simple entres, side dishes, and desserts which can be prepared with minimal effort and are consistent with nutrition recommendations for cardiovascular risk reduction. Cooking [employer] are taught by a Engineer, materials (RD) who has been trained by the [employer]. The chef or RD has a clear understanding of the importance of minimizing - if not completely eliminating - added fat, sugar, and sodium in recipes. Throughout the series of [place] Workshop sessions, patients will learn about healthy ingredients and efficient methods of cooking to build confidence in their capability to prepare    Cooking School weekly topics:  Adding Flavor- Sodium-Free  Fast and Healthy Breakfasts  Powerhouse Plant-Based Proteins  Satisfying Salads and Dressings  Simple Sides and  Sauces  International Cuisine-Spotlight on the [place] Zones  Delicious Desserts  Savory Soups  Efficiency Cooking - Meals in a Snap  Tasty Appetizers and Snacks  Comforting Weekend Breakfasts  One-Pot Wonders   Fast Evening Meals  Easy [place] (Psychosocial): New Thoughts, New Behaviors Clinical staff led group instruction and group discussion with PowerPoint presentation and patient guidebook. To enhance the learning environment the use of posters, models and videos may be added. Patients will learn and practice techniques for developing effective health and lifestyle goals. Patients will be able to effectively apply the goal setting process learned to develop at least one new personal goal.  The purpose of this lesson is to expose patients to a new skill set of behavior modification techniques such as techniques setting SMART goals, overcoming barriers, and achieving new thoughts and new behaviors.  Managing Moods and Relationships Clinical staff led group instruction and group discussion with PowerPoint presentation and patient guidebook. To enhance the learning environment the use of posters, models and videos may be added. Patients will learn how emotional and chronic stress factors can impact their health and relationships. They will learn healthy ways to manage their moods and utilize positive coping mechanisms. In addition, ICR patients will learn ways to improve communication skills. The purpose of this lesson is to expose patients to ways of understanding how one's mood and health are intimately connected. Developing a healthy outlook can help build positive relationships and connections with others. Patients will understand the importance of utilizing effective communication skills that include actively listening and being heard. They will learn and understand the importance of the "4 Cs" and especially Connections in  fostering of a Healthy Mind-Set.  Healthy Sleep for a Healthy Heart Clinical staff led group instruction and group discussion with PowerPoint presentation and patient guidebook. To enhance the learning environment the use of posters, models and videos may be added. At the conclusion of this workshop, patients will be able to demonstrate knowledge of the importance of sleep to overall health, well-being, and quality of life. They will understand the symptoms of, and treatments for, common sleep disorders. Patients will also be able to identify daytime and nighttime behaviors which impact sleep, and they will be able to apply these tools to help manage sleep-related challenges. The purpose of this lesson is to provide patients with a general overview of sleep and outline the importance of quality sleep. Patients will learn about a few of the most common sleep disorders. Patients will also be introduced to the  concept of "sleep hygiene," and discover ways to self-manage certain sleeping problems through simple daily behavior changes. Finally, the workshop will motivate patients by clarifying the links between quality sleep and their goals of heart-healthy living.   Recognizing and Reducing Stress Clinical staff led group instruction and group discussion with PowerPoint presentation and patient guidebook. To enhance the learning environment the use of posters, models and videos may be added. At the conclusion of this workshop, patients will be able to understand the types of stress reactions, differentiate between acute and chronic stress, and recognize the impact that chronic stress has on their health. They will also be able to apply different coping mechanisms, such as reframing negative self-talk. Patients will have the opportunity to practice a variety of stress management techniques, such as deep abdominal breathing, progressive muscle relaxation, and/or guided imagery.  The purpose of this lesson is to  educate patients on the role of stress in their lives and to provide healthy techniques for coping with it.  Learning Barriers/Preferences:  Learning Barriers/Preferences - 07/14/22 1614       Learning Barriers/Preferences   Learning Barriers Sight   Wears glasses   Learning Preferences Written Material;Pictoral             Education Topics:  Knowledge Questionnaire Score:  Knowledge Questionnaire Score - 07/14/22 1502       Knowledge Questionnaire Score   Pre Score 19/24             Core Components/Risk Factors/Patient Goals at Admission:  Personal Goals and Risk Factors at Admission - 07/14/22 1557       Core Components/Risk Factors/Patient Goals on Admission    Weight Management Yes;Obesity;Weight Loss    Intervention Weight Management/Obesity: Establish reasonable short term and long term weight goals.;Obesity: Provide education and appropriate resources to help participant work on and attain dietary goals.    Admit Weight 254 lb 6.6 oz (115.4 kg)    Goal Weight: Short Term 248 lb (112.5 kg)    Goal Weight: Long Term 174 lb (78.9 kg)    Expected Outcomes Short Term: Continue to assess and modify interventions until short term weight is achieved;Long Term: Adherence to nutrition and physical activity/exercise program aimed toward attainment of established weight goal;Weight Loss: Understanding of general recommendations for a balanced deficit meal plan, which promotes 1-2 lb weight loss per week and includes a negative energy balance of [phone] kcal/d    Diabetes Yes    Intervention Provide education about signs/symptoms and action to take for hypo/hyperglycemia.;Provide education about proper nutrition, including hydration, and aerobic/resistive exercise prescription along with prescribed medications to achieve blood glucose in normal ranges: Fasting glucose 65-99 mg/dL    Expected Outcomes Short Term: Participant verbalizes understanding of the signs/symptoms and  immediate care of hyper/hypoglycemia, proper foot care and importance of medication, aerobic/resistive exercise and nutrition plan for blood glucose control.;Long Term: Attainment of HbA1C < 7%.    Hypertension Yes    Intervention Provide education on lifestyle modifcations including regular physical activity/exercise, weight management, moderate sodium restriction and increased consumption of fresh fruit, vegetables, and low fat dairy, alcohol moderation, and smoking cessation.;Monitor prescription use compliance.    Expected Outcomes Short Term: Continued assessment and intervention until BP is < 140/50mm HG in hypertensive participants. < 130/47mm HG in hypertensive participants with diabetes, heart failure or chronic kidney disease.;Long Term: Maintenance of blood pressure at goal levels.    Lipids Yes    Intervention Provide education and support for participant on  nutrition & aerobic/resistive exercise along with prescribed medications to achieve LDL '70mg'$ , HDL >$Remo'40mg'BTkNe$ .    Expected Outcomes Short Term: Participant states understanding of desired cholesterol values and is compliant with medications prescribed. Participant is following exercise prescription and nutrition guidelines.;Long Term: Cholesterol controlled with medications as prescribed, with individualized exercise RX and with personalized nutrition plan. Value goals: LDL < $Rem'70mg'jDKE$ , HDL > 40 mg.    Stress Yes    Intervention Offer individual and/or small group education and counseling on adjustment to heart disease, stress management and health-related lifestyle change. Teach and support self-help strategies.;Refer participants experiencing significant psychosocial distress to appropriate mental health specialists for further evaluation and treatment. When possible, include family members and significant others in education/counseling sessions.    Expected Outcomes Short Term: Participant demonstrates changes in health-related behavior, relaxation  and other stress management skills, ability to obtain effective social support, and compliance with psychotropic medications if prescribed.;Long Term: Emotional wellbeing is indicated by absence of clinically significant psychosocial distress or social isolation.             Core Components/Risk Factors/Patient Goals Review:   Goals and Risk Factor Review     Row Name 07/20/22 1527 07/28/22 1543           Core Components/Risk Factors/Patient Goals Review   Personal Goals Review Weight Management/Obesity;Stress;Hypertension;Lipids;Diabetes Weight Management/Obesity;Stress;Hypertension;Lipids;Diabetes      Review Kamilah started cardiac rehab on 07/20/22 and did well with exercise. Vital signs stable. CBG's 239 pre Reyes's exercise is currently on hold as she is currently admitted to the hospital.      Expected Outcomes Veronika will continue to participate in cardiac rehab for exercise, nutrition and lifestyle modifications Rehanna will continue to participate in cardiac rehab for exercise, nutrition and lifestyle modifications when cleared to return               Core Components/Risk Factors/Patient Goals at Discharge (Final Review):   Goals and Risk Factor Review - 07/28/22 1543       Core Components/Risk Factors/Patient Goals Review   Personal Goals Review Weight Management/Obesity;Stress;Hypertension;Lipids;Diabetes    Review Ciela's exercise is currently on hold as she is currently admitted to the hospital.    Expected Outcomes Claudia will continue to participate in cardiac rehab for exercise, nutrition and lifestyle modifications when cleared to return             ITP Comments:  ITP Comments     Row Name 07/14/22 1335 07/20/22 1524 07/28/22 1541       ITP Comments Medical Director- Dr. Fransico Him, MD. Introduction to [facility] Education Program/ Intensive Cardiac Rehab. Initial Orientation Packet reviewed with the patient. 30 Day ITP Review. Dema started  traditional cardiac rehab on 07/20/22 and did well with exercise. 30 Day ITP Review. Folasade's exercise is currently on hold as she is currently admitted to the hospital              Comments: See ITP comments. Exercise is currently on hold.Harrell Gave RN BSN

## 2022-07-28 NOTE — Progress Notes (Signed)
Subjective:  No chest pain  Objective:  Vital Signs in the last 24 hours: Temp:  [97.6 F (36.4 C)-98 F (36.7 C)] 97.6 F (36.4 C) (10/10 0800) Pulse Rate:  [76-96] 96 (10/10 0800) Resp:  [9-22] 14 (10/10 0800) BP: (108-153)/(58-131) 132/76 (10/10 0800) SpO2:  [98 %-100 %] 99 % (10/10 0800)  Intake/Output from previous day: 10/09 0701 - 10/10 0700 In: 335.9 [IV Piggyback:335.9] Out: -   Physical Exam Vitals and nursing note reviewed.  Constitutional:      General: She is not in acute distress. Neck:     Vascular: No JVD.  Cardiovascular:     Rate and Rhythm: Normal rate and regular rhythm.     Heart sounds: Normal heart sounds. No murmur heard. Pulmonary:     Effort: Pulmonary effort is normal.     Breath sounds: Normal breath sounds. No wheezing or rales.  Musculoskeletal:     Right lower leg: No edema.     Left lower leg: No edema.      Imaging/tests reviewed and independently interpreted:  Left Heart Catheterization 07/27/22:  LV: 131/3, EDP 9 mmHg.  Ao 169/87, mean 119 mmHg.  No pressure gradient across the aortic valve. RCA: Large and dominant.  Probably one of the large vessels for her.  Previously placed mid RCA stent from 2011 has again in-stent restenosis of 70 to 80%.  Proximal to the stent and distal to the stent.  A 2.75 x 32 mm and a 2.75 x 20 mm Synergy XT DES stents are present and widely patent except at the focal prior stent area which was previously called on 03/10/2022. The PL branch which is large has a focal 90% stenosis in the proximal segment. LM: Moderate caliber vessel, no significant disease. LAD: Moderate caliber vessel with mild diffuse disease.  D1 is small.  D2 and D3 arise close by to each other and moderate vessels. LCx: Moderate caliber vessel.  Ostium is a 30 to 40% stenosis.  Gives origin to a small caliber however large territory OM1 with secondary lateral branch.  There is restenosis in the entire proximal and mid segment.  Balloon  angioplasty site performed 05/26/2022 with 2 mm balloon. Distal circumflex is small, has tandem 70% stenosis.      Impression: Recurrent restenosis of 70 to 80% in the mid RCA at the site of previous stenosis which is covered by DES.  There is new progression of PL branch stenosis from previous 60 to 70% to the present 90 to 95%.   There is restenosis at the balloon angioplasty site of the large distribution however diffusely diseased small calibered OM1 which has a large lateral branch.   I am concerned about recurrence of restenosis in the right coronary artery and also concerned about balloon angioplasty results of OM1 again as it is small and unable to be stented.   As she has class III angina pectoris, we could optimize her medically with aggressive weight loss, control of hypertension, aggressive lipid status control and add Ranexa.  Could consider two-vessel CABG to RCA and OM.  I will also present her case to heart team for collective opinion.  130 mL contrast utilized.     Nuclear stress test 07/26/2022: 1. Large reversible myocardial perfusion defect throughout the anterior, apical, and lateral walls of the left ventricle, consistent with myocardial ischemia. 2. Mild apical and lateral wall hypokinesis. 3. Left ventricular ejection fraction 58% 4. Non invasive risk stratification*: High   Echocardiogram 07/25/2022: 1. Left  ventricular ejection fraction, by estimation, is 60 to 65%. The  left ventricle has normal function. The left ventricle has no regional  wall motion abnormalities. There is mild concentric left ventricular  hypertrophy. Left ventricular diastolic  parameters are indeterminate.   2. Right ventricular systolic function is normal. The right ventricular  size is normal. Tricuspid regurgitation signal is inadequate for assessing  PA pressure.   3. The mitral valve is degenerative. No evidence of mitral valve  regurgitation. No evidence of mitral stenosis.   4. The  aortic valve is tricuspid. Aortic valve regurgitation is not  visualized. Aortic valve sclerosis/calcification is present, without any  evidence of aortic stenosis. Aortic valve area, by VTI measures 1.75 cm.  Aortic valve mean gradient measures  6.0 mmHg. Aortic valve Vmax measures 1.71 m/s.     Assessment & Recommendations:  56 y/o African-American female with hypertension, type 2 diabetes mellitus, CAD, postmenopausal vaginal bleeding, anemia   CAD: Recurrent restenosis in mid RCA as well as OM1.   No significant disease in LAD, but high risk for restenosis with symptomatic severe stenoses. Added Ranexa 1000 mg twice daily. We will discuss in heart team meeting regarding surgical revascularization options. Continue rest of the baseline antianginal therapy, along with dual antiplatelet therapy with aspirin and Brilinta.   Mixed hyperlipidemia: As above   Hypertension: Well-controlled.   F/u w/me on 08/10/2022 at 3 PM  Discussed interpretation of tests and management recommendations with the primary team   [name], MD Pager: [phone] Office: [phone]

## 2022-07-28 NOTE — Progress Notes (Signed)
Dr Florene Glen and Dr Kennon Portela for pt to stop taking the lasix, pt informed to stop.

## 2022-07-28 NOTE — Progress Notes (Signed)
Discharge instructions reviewed with pt.  Copy of instructions given to pt. Scripts filled by [facility] and delivered to pt's room.   Pt had a question about discharge medications, message sent to medical and cardiology to clarify. See note below.     Pt has question about taking lasix and jardiance----she states the cardiologist that rounded on her this weekend had stated something about her not needing the lasix and the jardiance since the jardiance helps pull off fluid. they had mentioned stopping the lasix for home----  message sent to medical MD and cardiology.  Await response.

## 2022-07-29 ENCOUNTER — Encounter (HOSPITAL_COMMUNITY): Payer: Medicaid Other

## 2022-07-31 ENCOUNTER — Encounter (HOSPITAL_COMMUNITY): Payer: Medicaid Other

## 2022-08-03 ENCOUNTER — Encounter (HOSPITAL_COMMUNITY): Payer: Medicaid Other

## 2022-08-05 ENCOUNTER — Encounter (HOSPITAL_COMMUNITY): Payer: Medicaid Other

## 2022-08-07 ENCOUNTER — Encounter (HOSPITAL_COMMUNITY): Payer: Medicaid Other

## 2022-08-10 ENCOUNTER — Encounter (HOSPITAL_COMMUNITY): Payer: Medicaid Other

## 2022-08-10 ENCOUNTER — Ambulatory Visit: Payer: Medicaid Other | Admitting: Cardiology

## 2022-08-10 ENCOUNTER — Encounter: Payer: Self-pay | Admitting: Cardiology

## 2022-08-10 VITALS — BP 137/87 | HR 94 | Temp 98.0°F | Resp 16 | Ht 62.0 in | Wt 246.0 lb

## 2022-08-10 DIAGNOSIS — I10 Essential (primary) hypertension: Secondary | ICD-10-CM

## 2022-08-10 DIAGNOSIS — E782 Mixed hyperlipidemia: Secondary | ICD-10-CM

## 2022-08-10 DIAGNOSIS — I25118 Atherosclerotic heart disease of native coronary artery with other forms of angina pectoris: Secondary | ICD-10-CM

## 2022-08-10 MED ORDER — EVOLOCUMAB 140 MG/ML ~~LOC~~ SOAJ
140.0000 mg | Freq: Once | SUBCUTANEOUS | Status: AC
  Administered 2022-08-10: 140 mg via SUBCUTANEOUS

## 2022-08-10 NOTE — Progress Notes (Signed)
Follow up visit  Subjective:   [name], female    DOB: [birth year], 56 y.o.   MRN: [id number]     HPI  Chief Complaint  Patient presents with   Coronary Artery Disease   Hospitalization Follow-up    56 y/o African-American female with hypertension, type 2 diabetes mellitus, CAD, postmenopausal vaginal bleeding, anemia  Patient was hospitalized in 07/2022 with worsening angina symptoms. Stress test showed large ischemia, details below. Patient was discussed at heart team meeting on 07/31/2022, details below. Symptoms of angina have improved since being on Ranexa. Patient has not had any recurrent chest pain since hospital discharge. Patient is still awaiting Repatha approval.     Current Outpatient Medications:    acetaminophen (TYLENOL) 325 MG tablet, Take 650 mg by mouth every 6 (six) hours as needed (pain.)., Disp: , Rfl:    amLODipine (NORVASC) 10 MG tablet, Take 1 tablet (10 mg total) by mouth every evening., Disp: 30 tablet, Rfl: 1   aspirin 81 MG chewable tablet, Chew 81 mg by mouth in the morning., Disp: , Rfl:    atorvastatin (LIPITOR) 40 MG tablet, Take 1 tablet (40 mg total) by mouth every evening., Disp: 90 tablet, Rfl: 3   empagliflozin (JARDIANCE) 10 MG TABS tablet, Take 1 tablet (10 mg total) by mouth daily., Disp: 30 tablet, Rfl: 1   Ferrous Sulfate (IRON) 325 (65 FE) MG TABS, Take 325 mg by mouth 2 (two) times daily., Disp: , Rfl:    isosorbide mononitrate (IMDUR) 30 MG 24 hr tablet, Take 1 tablet (30 mg total) by mouth daily., Disp: 30 tablet, Rfl: 1   losartan (COZAAR) 25 MG tablet, Take 1 tablet (25 mg total) by mouth daily., Disp: 30 tablet, Rfl: 1   metFORMIN (GLUCOPHAGE) 1000 MG tablet, Take 1,000 mg by mouth 2 (two) times daily with a meal., Disp: , Rfl:    metoprolol succinate (TOPROL-XL) 100 MG 24 hr tablet, Take 1 tablet (100 mg total) by mouth daily. Take with or immediately following a meal., Disp: 30 tablet, Rfl: 1   nitroGLYCERIN (NITROSTAT)  0.3 MG SL tablet, Place 0.3 mg under the tongue every 5 (five) minutes as needed for chest pain., Disp: , Rfl:    ranolazine (RANEXA) 1000 MG SR tablet, Take 1 tablet (1,000 mg total) by mouth 2 (two) times daily., Disp: 60 tablet, Rfl: 1   Semaglutide (OZEMPIC, 0.25 OR 0.5 MG/DOSE, [place]), Inject 0.5 mg into the skin every Sunday., Disp: , Rfl:    ticagrelor (BRILINTA) 90 MG TABS tablet, Take 1 tablet (90 mg total) by mouth 2 (two) times daily., Disp: 60 tablet, Rfl: 6   Cardiovascular & other pertient studies:  Reviewed external labs and tests, independently interpreted  EKG 08/10/2022: Sinus rhythm 95 bpm Diffuse nonspecific T-abnormality  Left Heart Catheterization 07/27/2022:  LV: 131/3, EDP 9 mmHg.  Ao 169/87, mean 119 mmHg.  No pressure gradient across the aortic valve. RCA: Large and dominant.  Probably one of the large vessels for her.  Previously placed mid RCA stent from 2011 has again in-stent restenosis of 70 to 80%.  Proximal to the stent and distal to the stent.  A 2.75 x 32 mm and a 2.75 x 20 mm Synergy XT DES stents are present and widely patent except at the focal prior stent area which was previously called on 03/10/2022. The PL branch which is large has a focal 90% stenosis in the proximal segment. LM: Moderate caliber vessel, no significant disease. LAD: Moderate  caliber vessel with mild diffuse disease.  D1 is small.  D2 and D3 arise close by to each other and moderate vessels. LCx: Moderate caliber vessel.  Ostium is a 30 to 40% stenosis.  Gives origin to a small caliber however large territory OM1 with secondary lateral branch.  There is restenosis in the entire proximal and mid segment.  Balloon angioplasty site performed 05/26/2022 with 2 mm balloon. Distal circumflex is small, has tandem 70% stenosis.      Impression: Recurrent restenosis of 70 to 80% in the mid RCA at the site of previous stenosis which is covered by DES.  There is new progression of PL branch stenosis  from previous 60 to 70% to the present 90 to 95%.   There is restenosis at the balloon angioplasty site of the large distribution however diffusely diseased small calibered OM1 which has a large lateral branch.   I am concerned about recurrence of restenosis in the right coronary artery and also concerned about balloon angioplasty results of OM1 again as it is small and unable to be stented.   As she has class III angina pectoris, we could optimize her medically with aggressive weight loss, control of hypertension, aggressive lipid status control and add Ranexa.  Could consider two-vessel CABG to RCA and OM.  I will also present her case to heart team for collective opinion.  130 mL contrast utilized.   Coronary intervention 03/10/2022:  LV: 168/80, EDP 19 mmHg.  Ao 196/90, mean 134 mmHg.  No pressure gradient across the aortic valve. LM: Moderate caliber vessel.  No significant disease. LAD: Moderate caliber vessel throughout.  Mild diffuse disease.  Gives origin to several small diagonals, diagonal 2 and 3 which arises as bifurcation in the midsegment of the LAD have mild ostial disease.  Mid to distal segment of the LAD has a focal 40% stenosis. CX: Again a moderate caliber vessel giving origin to a very large territory distribution OM1, a 2.0 x 2.25 mm vessel with a high-grade ulcerated proximal tandem 95 to 99% stenosis.  OM 2 is very small caliber and again, moderate area of distribution, ostial 50% stenosis.  Distal circumflex has a 40% stenosis. RCA: The mid segment of the RCA has a stent from 2013 and a 2.75 x 23 mm Xience stent from 10/28/2019.  These are overlapping.  The candy wrapper stenosis both at the inflow and outflow of the old stent from 2013 (proximal ISR, distal new lesion).  There is a 60 to 70% proximal stenosis starting from the proximal segment of the previously placed stent from 2021.   Interventional data: Difficult procedure due to poor visualization and resilient in-stent  restenosis.  2.75 x 15 mm score flex balloon angioplasty at 20 atmospheric pressure followed by stenting with a 2.75 x 32 mm Synergy XT DES covering the entire in-stent restenosis to outflow stenosis of previously placed stent from 2013. Successful stenting of the proximal to mid RCA overlapping stent that was placed on 10/28/2019 and the distal edge with a 2.74 x 20 mm Synergy XD deployed at 16 atmospheric pressure.  The entire stented segment was postdilated to 20 atm pressure giving a 3 mm lumen.  Recommendation: Patient extremely high risk for recurrent restenosis and progression of coronary disease in view of uncontrolled hypertension, diabetes mellitus and noncompliance with medical advice.  Reiterate risks to the patient.  Aspirin indefinitely and Brilinta for at least 1 year in view of NSTEMI.  Patient will be scheduled for elective stage  intervention to a very large OM1 tomorrow if she remains stable through the right.  Although OM1 is 2.0-2.25 mm vessel, has a very large area of distribution and a high-grade ulcerated 99% stenosis.  She is at risk for developing recurrent ACS if untreated.  175 mL contrast utilized  Nuclear stress test 07/26/2022: 1. Large reversible myocardial perfusion defect throughout the anterior, apical, and lateral walls of the left ventricle, consistent with myocardial ischemia. 2. Mild apical and lateral wall hypokinesis. 3. Left ventricular ejection fraction 58% 4. Non invasive risk stratification*: High  Recent labs: 05/27/2022: Chol 143, TG 69, HDL 35, LDL 94  04/02/2022: Glucose 242, BUN/Cr 17/0.96. EGFR >60. Na/K 139/4.1.  H/H 8.2/25. MCV 76. Platelets 315 HbA1C 8.7% Lipid panel NA Lipoprotein a 196  Latest Reference Range & Units 04/02/22 15:48 04/02/22 16:08  Troponin I (High Sensitivity) <18 ng/L 11 10     Review of Systems  Cardiovascular:  Negative for chest pain, dyspnea on exertion, leg swelling, palpitations and syncope.       Vitals:    08/10/22 1443  BP: 137/87  Pulse: 94  Resp: 16  Temp: 98 F (36.7 C)  SpO2: 100%    Body mass index is 44.99 kg/m. Filed Weights   08/10/22 1443  Weight: 246 lb (111.6 kg)     Objective:   Physical Exam Vitals and nursing note reviewed.  Constitutional:      General: She is not in acute distress. Neck:     Vascular: No JVD.  Cardiovascular:     Rate and Rhythm: Normal rate and regular rhythm.     Heart sounds: Normal heart sounds. No murmur heard. Pulmonary:     Effort: Pulmonary effort is normal.     Breath sounds: Normal breath sounds. No wheezing or rales.  Musculoskeletal:     Right lower leg: No edema.     Left lower leg: No edema.             Visit diagnoses:   ICD-10-CM   1. Coronary artery disease of native artery of native heart with stable angina pectoris ([place])  I25.118 EKG 12-Lead    2. Essential hypertension  I10     3. Mixed hyperlipidemia  E78.2 Evolocumab SOAJ 140 mg       Orders Placed This Encounter  Procedures   EKG 12-Lead     Assessment & Recommendations:   56 y/o African-American female with hypertension, type 2 diabetes mellitus, CAD, postmenopausal vaginal bleeding, anemia  CAD: Anterior, apical, and lateral walls of the left ventricle, consistent with myocardial ischemia. (07/2022). While this would suggest possible LAD disease, no significant LAD disease on noted on cath (07/2022). Complex CAD with residual severe prox RCA disease, mid RCA ISR, severe OM1 disease. Discussed in heart team meeting on 07/31/2022. No targets for CABG. OM1 too small to have sustainable PCI solution. Prox RCA and mid RCA PCI most likely to be beneficial, if angina on optimal medical therapy. At this time, angina symptoms have improved on Ranexa 1000 mg bid. Continue the same for now. Given LDL of 94 and lipoprotein a of 196 on Crestor milligram daily, I recommend Repatha.   Repatha sample injection administered, pending approval.  Continue  DAPT with Aspirin and Brilinta.  Mixed hyperlipidemia: As above  Hypertension: Well-controlled.  F/u in 4 weeks    [name], MD Pager: [phone] Office: [phone]

## 2022-08-11 ENCOUNTER — Other Ambulatory Visit: Payer: Self-pay

## 2022-08-11 DIAGNOSIS — I214 Non-ST elevation (NSTEMI) myocardial infarction: Secondary | ICD-10-CM

## 2022-08-11 DIAGNOSIS — E785 Hyperlipidemia, unspecified: Secondary | ICD-10-CM

## 2022-08-11 DIAGNOSIS — E782 Mixed hyperlipidemia: Secondary | ICD-10-CM

## 2022-08-11 DIAGNOSIS — I25118 Atherosclerotic heart disease of native coronary artery with other forms of angina pectoris: Secondary | ICD-10-CM

## 2022-08-11 MED ORDER — REPATHA SURECLICK 140 MG/ML ~~LOC~~ SOAJ: 140.0000 mg | SUBCUTANEOUS | 5 refills | Status: DC

## 2022-08-11 NOTE — Progress Notes (Signed)
Completed peer-to-peer with patient's insurance for [employer].   Redetermination was approved.   Patient given sample at [place] yesterday and given first injection.

## 2022-08-12 ENCOUNTER — Telehealth (HOSPITAL_COMMUNITY): Payer: Self-pay | Admitting: *Deleted

## 2022-08-12 ENCOUNTER — Encounter (HOSPITAL_COMMUNITY): Payer: Medicaid Other

## 2022-08-12 NOTE — Telephone Encounter (Signed)
Spoke with [name], she has decided to hold off on returning to exercise at this time until she follows up with Dr Virgina Jock in November. Will discharge from cardiac rehab at this time. [name] attended 4 exercise sessions between 07/14/22- 07/24/22. [name] did not return to exercise after her hospital admission.Barnet Pall, RN,BSN 08/12/2022 3:05 PM

## 2022-08-12 NOTE — Telephone Encounter (Signed)
Left message to call cardiac rehab.Lynnett Langlinais Walden Jeston Junkins RN BSN  

## 2022-08-14 ENCOUNTER — Other Ambulatory Visit: Payer: Self-pay

## 2022-08-14 ENCOUNTER — Encounter (HOSPITAL_COMMUNITY): Payer: Medicaid Other

## 2022-08-17 ENCOUNTER — Encounter (HOSPITAL_COMMUNITY): Payer: Medicaid Other

## 2022-08-19 ENCOUNTER — Encounter (HOSPITAL_COMMUNITY): Payer: Medicaid Other

## 2022-08-21 ENCOUNTER — Encounter (HOSPITAL_COMMUNITY): Payer: Medicaid Other

## 2022-08-24 ENCOUNTER — Encounter (HOSPITAL_COMMUNITY): Payer: Medicaid Other

## 2022-08-26 ENCOUNTER — Encounter (HOSPITAL_COMMUNITY): Payer: Medicaid Other

## 2022-08-26 ENCOUNTER — Other Ambulatory Visit: Payer: Self-pay

## 2022-08-27 ENCOUNTER — Other Ambulatory Visit: Payer: Self-pay

## 2022-08-28 NOTE — Addendum Note (Signed)
Encounter addended by: Magda Kiel, RN on: 08/28/2022 10:09 AM  Actions taken: Episode resolved

## 2022-08-31 ENCOUNTER — Encounter: Payer: Self-pay | Admitting: Cardiology

## 2022-08-31 ENCOUNTER — Ambulatory Visit: Payer: Medicaid Other | Admitting: Cardiology

## 2022-08-31 ENCOUNTER — Other Ambulatory Visit: Payer: Self-pay

## 2022-08-31 ENCOUNTER — Other Ambulatory Visit (HOSPITAL_COMMUNITY): Payer: Self-pay

## 2022-08-31 ENCOUNTER — Other Ambulatory Visit (HOSPITAL_BASED_OUTPATIENT_CLINIC_OR_DEPARTMENT_OTHER): Payer: Self-pay

## 2022-08-31 VITALS — BP 143/83 | HR 90 | Resp 16 | Ht 62.0 in | Wt 239.0 lb

## 2022-08-31 DIAGNOSIS — I10 Essential (primary) hypertension: Secondary | ICD-10-CM

## 2022-08-31 DIAGNOSIS — E782 Mixed hyperlipidemia: Secondary | ICD-10-CM

## 2022-08-31 DIAGNOSIS — I25118 Atherosclerotic heart disease of native coronary artery with other forms of angina pectoris: Secondary | ICD-10-CM

## 2022-08-31 MED ORDER — EMPAGLIFLOZIN 10 MG PO TABS
10.0000 mg | ORAL_TABLET | Freq: Every day | ORAL | 1 refills | Status: DC
  Filled 2022-08-31 – 2022-09-18 (×2): qty 30, 30d supply, fill #0

## 2022-08-31 NOTE — Progress Notes (Signed)
Follow up visit  Subjective:   [name], female    DOB: November 20, 1965, 56 y.o.   MRN: [id number]     HPI  Chief Complaint  Patient presents with   Coronary Artery Disease   Follow-up    3 month   Results    labs    56 y/o African-American female with hypertension, type 2 diabetes mellitus, CAD, postmenopausal vaginal bleeding, anemia  Patient was hospitalized in 07/2022 with worsening angina symptoms. Stress test showed large ischemia, details below. Patient was discussed at heart team meeting on 07/31/2022, details below. Symptoms of angina have improved since being on Ranexa. She is walking more. She has lost 17 lbs.     Current Outpatient Medications:    acetaminophen (TYLENOL) 325 MG tablet, Take 650 mg by mouth every 6 (six) hours as needed (pain.)., Disp: , Rfl:    amLODipine (NORVASC) 10 MG tablet, Take 1 tablet (10 mg total) by mouth every evening., Disp: 30 tablet, Rfl: 1   aspirin 81 MG chewable tablet, Chew 81 mg by mouth in the morning., Disp: , Rfl:    atorvastatin (LIPITOR) 40 MG tablet, Take 1 tablet (40 mg total) by mouth every evening., Disp: 90 tablet, Rfl: 3   empagliflozin (JARDIANCE) 10 MG TABS tablet, Take 1 tablet (10 mg total) by mouth daily., Disp: 30 tablet, Rfl: 1   Evolocumab (REPATHA SURECLICK) 629 MG/ML SOAJ, Inject 140 mg into the skin every 14 (fourteen) days., Disp: 2 mL, Rfl: 5   Ferrous Sulfate (IRON) 325 (65 FE) MG TABS, Take 325 mg by mouth 2 (two) times daily., Disp: , Rfl:    isosorbide mononitrate (IMDUR) 30 MG 24 hr tablet, Take 1 tablet (30 mg total) by mouth daily., Disp: 30 tablet, Rfl: 1   losartan (COZAAR) 25 MG tablet, Take 1 tablet (25 mg total) by mouth daily., Disp: 30 tablet, Rfl: 1   metFORMIN (GLUCOPHAGE) 1000 MG tablet, Take 1,000 mg by mouth 2 (two) times daily with a meal., Disp: , Rfl:    metoprolol succinate (TOPROL-XL) 100 MG 24 hr tablet, Take 1 tablet (100 mg total) by mouth daily. Take with or immediately  following a meal., Disp: 30 tablet, Rfl: 1   nitroGLYCERIN (NITROSTAT) 0.3 MG SL tablet, Place 0.3 mg under the tongue every 5 (five) minutes as needed for chest pain., Disp: , Rfl:    ranolazine (RANEXA) 1000 MG SR tablet, Take 1 tablet (1,000 mg total) by mouth 2 (two) times daily., Disp: 60 tablet, Rfl: 1   Semaglutide (OZEMPIC, 0.25 OR 0.5 MG/DOSE, [place]), Inject 0.5 mg into the skin every Sunday., Disp: , Rfl:    ticagrelor (BRILINTA) 90 MG TABS tablet, Take 1 tablet (90 mg total) by mouth 2 (two) times daily., Disp: 60 tablet, Rfl: 6   Cardiovascular & other pertient studies:  Reviewed external labs and tests, independently interpreted  EKG 08/10/2022: Sinus rhythm 95 bpm Diffuse nonspecific T-abnormality  Left Heart Catheterization 07/27/2022:  LV: 131/3, EDP 9 mmHg.  Ao 169/87, mean 119 mmHg.  No pressure gradient across the aortic valve. RCA: Large and dominant.  Probably one of the large vessels for her.  Previously placed mid RCA stent from 2011 has again in-stent restenosis of 70 to 80%.  Proximal to the stent and distal to the stent.  A 2.75 x 32 mm and a 2.75 x 20 mm Synergy XT DES stents are present and widely patent except at the focal prior stent area which was previously called  on 03/10/2022. The PL branch which is large has a focal 90% stenosis in the proximal segment. LM: Moderate caliber vessel, no significant disease. LAD: Moderate caliber vessel with mild diffuse disease.  D1 is small.  D2 and D3 arise close by to each other and moderate vessels. LCx: Moderate caliber vessel.  Ostium is a 30 to 40% stenosis.  Gives origin to a small caliber however large territory OM1 with secondary lateral branch.  There is restenosis in the entire proximal and mid segment.  Balloon angioplasty site performed 05/26/2022 with 2 mm balloon. Distal circumflex is small, has tandem 70% stenosis.      Impression: Recurrent restenosis of 70 to 80% in the mid RCA at the site of previous stenosis  which is covered by DES.  There is new progression of PL branch stenosis from previous 60 to 70% to the present 90 to 95%.   There is restenosis at the balloon angioplasty site of the large distribution however diffusely diseased small calibered OM1 which has a large lateral branch.   I am concerned about recurrence of restenosis in the right coronary artery and also concerned about balloon angioplasty results of OM1 again as it is small and unable to be stented.   As she has class III angina pectoris, we could optimize her medically with aggressive weight loss, control of hypertension, aggressive lipid status control and add Ranexa.  Could consider two-vessel CABG to RCA and OM.  I will also present her case to heart team for collective opinion.  130 mL contrast utilized.   Coronary intervention 03/10/2022:  LV: 168/80, EDP 19 mmHg.  Ao 196/90, mean 134 mmHg.  No pressure gradient across the aortic valve. LM: Moderate caliber vessel.  No significant disease. LAD: Moderate caliber vessel throughout.  Mild diffuse disease.  Gives origin to several small diagonals, diagonal 2 and 3 which arises as bifurcation in the midsegment of the LAD have mild ostial disease.  Mid to distal segment of the LAD has a focal 40% stenosis. CX: Again a moderate caliber vessel giving origin to a very large territory distribution OM1, a 2.0 x 2.25 mm vessel with a high-grade ulcerated proximal tandem 95 to 99% stenosis.  OM 2 is very small caliber and again, moderate area of distribution, ostial 50% stenosis.  Distal circumflex has a 40% stenosis. RCA: The mid segment of the RCA has a stent from 2013 and a 2.75 x 23 mm Xience stent from 10/28/2019.  These are overlapping.  The candy wrapper stenosis both at the inflow and outflow of the old stent from 2013 (proximal ISR, distal new lesion).  There is a 60 to 70% proximal stenosis starting from the proximal segment of the previously placed stent from 2021.   Interventional  data: Difficult procedure due to poor visualization and resilient in-stent restenosis.  2.75 x 15 mm score flex balloon angioplasty at 20 atmospheric pressure followed by stenting with a 2.75 x 32 mm Synergy XT DES covering the entire in-stent restenosis to outflow stenosis of previously placed stent from 2013. Successful stenting of the proximal to mid RCA overlapping stent that was placed on 10/28/2019 and the distal edge with a 2.74 x 20 mm Synergy XD deployed at 16 atmospheric pressure.  The entire stented segment was postdilated to 20 atm pressure giving a 3 mm lumen.  Recommendation: Patient extremely high risk for recurrent restenosis and progression of coronary disease in view of uncontrolled hypertension, diabetes mellitus and noncompliance with medical advice.  Reiterate  risks to the patient.  Aspirin indefinitely and Brilinta for at least 1 year in view of NSTEMI.  Patient will be scheduled for elective stage intervention to a very large OM1 tomorrow if she remains stable through the right.  Although OM1 is 2.0-2.25 mm vessel, has a very large area of distribution and a high-grade ulcerated 99% stenosis.  She is at risk for developing recurrent ACS if untreated.  175 mL contrast utilized  Nuclear stress test 07/26/2022: 1. Large reversible myocardial perfusion defect throughout the anterior, apical, and lateral walls of the left ventricle, consistent with myocardial ischemia. 2. Mild apical and lateral wall hypokinesis. 3. Left ventricular ejection fraction 58% 4. Non invasive risk stratification*: High  Recent labs: 07/28/2022: Glucose 154, BUN/Cr 14/1.06. EGFR >60. Na/K 139/4.2. Rest of the CMP normal H/H 8.6/26.3. MCV 68. Platelets 340 Chol 105, TG 71, HDL 57, LDL 67  05/27/2022: Chol 143, TG 69, HDL 35, LDL 94  04/02/2022: Glucose 242, BUN/Cr 17/0.96. EGFR >60. Na/K 139/4.1.  H/H 8.2/25. MCV 76. Platelets 315 HbA1C 8.7% Lipid panel NA Lipoprotein a 196  Latest Reference Range &  Units 04/02/22 15:48 04/02/22 16:08  Troponin I (High Sensitivity) <18 ng/L 11 10     Review of Systems  Cardiovascular:  Negative for chest pain, dyspnea on exertion, leg swelling, palpitations and syncope.       Vitals:   08/31/22 1109  BP: (!) 143/83  Pulse: 90  Resp: 16  SpO2: 96%    Body mass index is 43.71 kg/m. Filed Weights   08/31/22 1109  Weight: 239 lb (108.4 kg)     Objective:   Physical Exam Vitals and nursing note reviewed.  Constitutional:      General: She is not in acute distress. Neck:     Vascular: No JVD.  Cardiovascular:     Rate and Rhythm: Normal rate and regular rhythm.     Heart sounds: Normal heart sounds. No murmur heard. Pulmonary:     Effort: Pulmonary effort is normal.     Breath sounds: Normal breath sounds. No wheezing or rales.  Musculoskeletal:     Right lower leg: No edema.     Left lower leg: No edema.             Visit diagnoses:   ICD-10-CM   1. Coronary artery disease of native artery of native heart with stable angina pectoris ([place])  I25.118     2. Essential hypertension  I10     3. Mixed hyperlipidemia  E78.2         Assessment & Recommendations:   56 y/o African-American female with hypertension, type 2 diabetes mellitus, CAD, postmenopausal vaginal bleeding, anemia  CAD: Anterior, apical, and lateral walls of the left ventricle, consistent with myocardial ischemia. (07/2022). While this would suggest possible LAD disease, no significant LAD disease on noted on cath (07/2022). Complex CAD with residual severe prox RCA disease, mid RCA ISR, severe OM1 disease. Discussed in heart team meeting on 07/31/2022. No targets for CABG. OM1 too small to have sustainable PCI solution. Prox RCA and mid RCA PCI most likely to be beneficial, if angina on optimal medical therapy. At this time, angina symptoms have improved on Ranexa 1000 mg bid. Continue the same for now. LDL down to 67 on Crestor and  Repatha. Continue DAPT with Aspirin and Brilinta at least till 07/2022. Jardiance denies. Given CAD, diabetes, recommend Jardiance. Will appeal.  Mixed hyperlipidemia: As above  Hypertension: Well-controlled.  F/u in 3  months     [name], MD Pager: [phone] Office: [phone]

## 2022-09-01 LAB — LIPID PANEL
Chol/HDL Ratio: 2.1 ratio (ref 0.0–4.4)
Cholesterol, Total: 87 mg/dL — ABNORMAL LOW (ref 100–199)
HDL: 42 mg/dL (ref >39)
LDL Chol Calc (NIH): 30 mg/dL (ref 0–99)
Triglycerides: 68 mg/dL (ref 0–149)
VLDL Cholesterol Cal: 15 mg/dL (ref 5–40)

## 2022-09-07 ENCOUNTER — Other Ambulatory Visit: Payer: Self-pay

## 2022-09-15 DIAGNOSIS — I25119 Atherosclerotic heart disease of native coronary artery with unspecified angina pectoris: Secondary | ICD-10-CM | POA: Insufficient documentation

## 2022-09-18 ENCOUNTER — Other Ambulatory Visit: Payer: Self-pay

## 2022-09-25 ENCOUNTER — Other Ambulatory Visit: Payer: Self-pay

## 2022-09-28 ENCOUNTER — Other Ambulatory Visit: Payer: Self-pay

## 2022-09-28 ENCOUNTER — Other Ambulatory Visit: Payer: Self-pay | Admitting: Cardiology

## 2022-09-28 MED ORDER — ISOSORBIDE MONONITRATE ER 30 MG PO TB24: 30.0000 mg | ORAL_TABLET | Freq: Every day | ORAL | 1 refills | Status: DC

## 2022-09-28 MED ORDER — METOPROLOL SUCCINATE ER 100 MG PO TB24: 100.0000 mg | ORAL_TABLET | Freq: Every day | ORAL | 1 refills | Status: DC

## 2022-09-28 MED ORDER — LOSARTAN POTASSIUM 25 MG PO TABS: 25.0000 mg | ORAL_TABLET | Freq: Every day | ORAL | 1 refills | Status: DC

## 2022-09-28 MED ORDER — AMLODIPINE BESYLATE 10 MG PO TABS: 10.0000 mg | ORAL_TABLET | Freq: Every evening | ORAL | 1 refills | Status: DC

## 2022-09-28 MED ORDER — EMPAGLIFLOZIN 10 MG PO TABS: 10.0000 mg | ORAL_TABLET | Freq: Every day | ORAL | 1 refills | Status: AC

## 2022-10-15 ENCOUNTER — Other Ambulatory Visit: Payer: Self-pay

## 2022-10-15 ENCOUNTER — Emergency Department (HOSPITAL_COMMUNITY)
Admission: EM | Admit: 2022-10-15 | Discharge: 2022-10-16 | Disposition: A | Discharge status: Home / Self Care | Payer: Medicaid Other | Attending: Emergency Medicine | Admitting: Emergency Medicine

## 2022-10-15 ENCOUNTER — Emergency Department (HOSPITAL_COMMUNITY): Payer: Medicaid Other

## 2022-10-15 DIAGNOSIS — R110 Nausea: Secondary | ICD-10-CM | POA: Insufficient documentation

## 2022-10-15 DIAGNOSIS — E119 Type 2 diabetes mellitus without complications: Secondary | ICD-10-CM | POA: Diagnosis not present

## 2022-10-15 DIAGNOSIS — R0789 Other chest pain: Secondary | ICD-10-CM | POA: Insufficient documentation

## 2022-10-15 DIAGNOSIS — Z794 Long term (current) use of insulin: Secondary | ICD-10-CM | POA: Insufficient documentation

## 2022-10-15 DIAGNOSIS — R42 Dizziness and giddiness: Secondary | ICD-10-CM | POA: Insufficient documentation

## 2022-10-15 DIAGNOSIS — Z7984 Long term (current) use of oral hypoglycemic drugs: Secondary | ICD-10-CM | POA: Diagnosis not present

## 2022-10-15 DIAGNOSIS — I110 Hypertensive heart disease with heart failure: Secondary | ICD-10-CM | POA: Diagnosis not present

## 2022-10-15 DIAGNOSIS — Z79899 Other long term (current) drug therapy: Secondary | ICD-10-CM | POA: Diagnosis not present

## 2022-10-15 DIAGNOSIS — M546 Pain in thoracic spine: Secondary | ICD-10-CM | POA: Diagnosis not present

## 2022-10-15 DIAGNOSIS — Z7982 Long term (current) use of aspirin: Secondary | ICD-10-CM | POA: Insufficient documentation

## 2022-10-15 DIAGNOSIS — I5032 Chronic diastolic (congestive) heart failure: Secondary | ICD-10-CM | POA: Diagnosis not present

## 2022-10-15 DIAGNOSIS — R079 Chest pain, unspecified: Secondary | ICD-10-CM | POA: Diagnosis present

## 2022-10-15 DIAGNOSIS — I2510 Atherosclerotic heart disease of native coronary artery without angina pectoris: Secondary | ICD-10-CM | POA: Diagnosis not present

## 2022-10-15 DIAGNOSIS — R0602 Shortness of breath: Secondary | ICD-10-CM | POA: Insufficient documentation

## 2022-10-15 LAB — CBC
HCT: 35.3 % — ABNORMAL LOW (ref 36.0–46.0)
Hemoglobin: 11.1 g/dL — ABNORMAL LOW (ref 12.0–15.0)
MCH: 23.6 pg — ABNORMAL LOW (ref 26.0–34.0)
MCHC: 31.4 g/dL (ref 30.0–36.0)
MCV: 74.9 fL — ABNORMAL LOW (ref 80.0–100.0)
Platelets: 334 10*3/uL (ref 150–400)
RBC: 4.71 MIL/uL (ref 3.87–5.11)
RDW: 18.8 % — ABNORMAL HIGH (ref 11.5–15.5)
WBC: 5.5 10*3/uL (ref 4.0–10.5)
nRBC: 0 % (ref 0.0–0.2)

## 2022-10-15 LAB — BASIC METABOLIC PANEL
Anion gap: 12 (ref 5–15)
BUN: 16 mg/dL (ref 6–20)
CO2: 19 mmol/L — ABNORMAL LOW (ref 22–32)
Calcium: 9.3 mg/dL (ref 8.9–10.3)
Chloride: 108 mmol/L (ref 98–111)
Creatinine, Ser: 1.25 mg/dL — ABNORMAL HIGH (ref 0.44–1.00)
GFR, Estimated: 51 mL/min — ABNORMAL LOW (ref >60)
Glucose, Bld: 152 mg/dL — ABNORMAL HIGH (ref 70–99)
Potassium: 3.7 mmol/L (ref 3.5–5.1)
Sodium: 139 mmol/L (ref 135–145)

## 2022-10-15 LAB — BRAIN NATRIURETIC PEPTIDE: B Natriuretic Peptide: 18.2 pg/mL (ref 0.0–100.0)

## 2022-10-15 LAB — D-DIMER, QUANTITATIVE: D-Dimer, Quant: 0.71 ug/mL-FEU — ABNORMAL HIGH (ref 0.00–0.50)

## 2022-10-15 LAB — TROPONIN I (HIGH SENSITIVITY)
Troponin I (High Sensitivity): 3 ng/L (ref <18)
Troponin I (High Sensitivity): 3 ng/L (ref <18)

## 2022-10-15 LAB — I-STAT BETA HCG BLOOD, ED (MC, WL, AP ONLY): I-stat hCG, quantitative: 6.1 m[IU]/mL — ABNORMAL HIGH (ref <5)

## 2022-10-15 MED ORDER — NITROGLYCERIN 0.4 MG SL SUBL
0.4000 mg | SUBLINGUAL_TABLET | SUBLINGUAL | Status: DC | PRN
  Administered 2022-10-15: 0.4 mg via SUBLINGUAL

## 2022-10-15 MED ORDER — NITROGLYCERIN 0.4 MG SL SUBL: 0.4000 mg | SUBLINGUAL_TABLET | SUBLINGUAL | 0 refills | Status: DC | PRN

## 2022-10-15 MED ORDER — ONDANSETRON HCL 4 MG/2ML IJ SOLN
4.0000 mg | Freq: Once | INTRAMUSCULAR | Status: AC
  Administered 2022-10-15: 4 mg via INTRAVENOUS
  Filled 2022-10-15: qty 2

## 2022-10-15 NOTE — ED Provider Notes (Signed)
[place] EMERGENCY DEPARTMENT Provider Note   CSN: [id number] Arrival date & time: 10/15/22  0915     History  Chief Complaint  Patient presents with   Chest Pain   Shortness of Breath   Dizziness   Nausea    [name] is a 56 y.o. female with T2DM, HTN, IDA, chronic diastolic CHF, morbid obesity, CAD s/p PCI (RCA 2013 and 2019), DVT in 2012 not on [facility], who presents with CP, SOB, nausea.  Patient reports 8/10 L-sided anterior chest pain, middle thoracic back pain, SOB/DOE, and nausea. Also endorses lightheadedness. No vomiting. Denies f/c, abdominal pain, leg swelling, weight gain. Also endorses some tingling in her left foot that is abnormal for her.  Per chart review, patient was admitted from 07/24/22 to 07/28/22 for CP found to have CAD and angina. Had an NSTEMI in 02/2022 w/ unsuccessful left heart cath and then had successful PTCA on 05/26/22. Saw cardiology on 08/31/22 where she reported anginal symptoms improved on Ranexa. Per documentation from that visit: "Complex CAD with residual severe prox RCA disease, mid RCA ISR, severe OM1 disease. Discussed in heart team meeting on 07/31/2022. No targets for CABG. OM1 too small to have sustainable PCI solution. Prox RCA and mid RCA PCI most likely to be beneficial, if angina on optimal medical therapy. At this time, angina symptoms have improved on Ranexa 1000 mg bid."   Chest Pain Associated symptoms: dizziness and shortness of breath   Shortness of Breath Associated symptoms: chest pain   Dizziness Associated symptoms: chest pain and shortness of breath        Home Medications Prior to Admission medications   Medication Sig Start Date End Date Taking? Authorizing Provider  acetaminophen (TYLENOL) 325 MG tablet Take 650 mg by mouth every 6 (six) hours as needed (pain.).    [provider]  amLODipine (NORVASC) 10 MG tablet TAKE 1 TABLET(10 MG) BY MOUTH EVERY EVENING 09/28/22   Patwardhan,  Reynold Bowen, MD  aspirin 81 MG chewable tablet Chew 81 mg by mouth in the morning.    [provider]  atorvastatin (LIPITOR) 40 MG tablet Take 1 tablet (40 mg total) by mouth every evening. 05/27/22   Patwardhan, Reynold Bowen, MD  empagliflozin (JARDIANCE) 10 MG TABS tablet Take 1 tablet (10 mg total) by mouth daily. 09/28/22 03/27/23  Patwardhan, Reynold Bowen, MD  Evolocumab (REPATHA SURECLICK) 660 MG/ML SOAJ Inject 140 mg into the skin every 14 (fourteen) days. 08/11/22 02/07/23  Patwardhan, Reynold Bowen, MD  Ferrous Sulfate (IRON) 325 (65 FE) MG TABS Take 325 mg by mouth 2 (two) times daily.    [provider]  furosemide (LASIX) 40 MG tablet Take 40 mg by mouth as needed. 08/27/22   [provider]  isosorbide mononitrate (IMDUR) 30 MG 24 hr tablet Take 1 tablet (30 mg total) by mouth daily. 09/28/22 03/27/23  Patwardhan, Reynold Bowen, MD  losartan (COZAAR) 25 MG tablet Take 1 tablet (25 mg total) by mouth daily. 09/28/22 03/27/23  Patwardhan, Reynold Bowen, MD  metFORMIN (GLUCOPHAGE) 1000 MG tablet Take 1,000 mg by mouth 2 (two) times daily with a meal.    [provider]  metoprolol succinate (TOPROL-XL) 100 MG 24 hr tablet Take 1 tablet (100 mg total) by mouth daily. Take with or immediately following a meal. 09/28/22 03/27/23  Patwardhan, Reynold Bowen, MD  nitroGLYCERIN (NITROSTAT) 0.3 MG SL tablet Place 0.3 mg under the tongue every 5 (five) minutes as needed for chest pain.  [provider]  ranolazine (RANEXA) 1000 MG SR tablet Take 1 tablet (1,000 mg total) by mouth 2 (two) times daily. 07/28/22 09/26/22  Elodia Florence., MD  Semaglutide (OZEMPIC, 0.25 OR 0.5 MG/DOSE, [place]) Inject 0.5 mg into the skin every Sunday.    [provider]  ticagrelor (BRILINTA) 90 MG TABS tablet Take 1 tablet (90 mg total) by mouth 2 (two) times daily. 03/12/22   Shawna Clamp, MD      Allergies    Detrol [tolterodine tartrate]    Review of Systems   Review of Systems  Respiratory:   Positive for shortness of breath.   Cardiovascular:  Positive for chest pain.  Neurological:  Positive for dizziness.   Review of systems Negative for f/c.  A 10 point review of systems was performed and is negative unless otherwise reported in HPI.  Physical Exam Updated Vital Signs BP 124/80   Pulse 84   Temp 97.9 F (36.6 C)   Resp 16   Ht '5\' 2"'$  (1.575 m)   Wt 105.2 kg   LMP 06/16/2019   SpO2 100%   BMI 42.43 kg/m  Physical Exam General: Normal appearing female, lying in bed.  HEENT: PERRLA, Sclera anicteric, MMM, trachea midline.  Cardiology: RRR, no murmurs/rubs/gallops. BL radial and DP pulses equal bilaterally.  Resp: Normal respiratory rate and effort. CTAB, no wheezes, rhonchi, crackles.  Abd: Soft, non-tender, non-distended. No rebound tenderness or guarding.  GU: Deferred. MSK: No peripheral edema or signs of trauma. Extremities without deformity or TTP. No cyanosis or clubbing. Skin: warm, dry. No rashes or lesions. Back: No CVA tenderness Neuro: A&Ox4, CNs II-XII grossly intact. MAEs. Sensation grossly intact.  Psych: Normal mood and affect.   ED Results / Procedures / Treatments   Labs (all labs ordered are listed, but only abnormal results are displayed) Labs Reviewed  BASIC METABOLIC PANEL - Abnormal; Notable for the following components:      Result Value   CO2 19 (*)    Glucose, Bld 152 (*)    Creatinine, Ser 1.25 (*)    GFR, Estimated 51 (*)    All other components within normal limits  CBC - Abnormal; Notable for the following components:   Hemoglobin 11.1 (*)    HCT 35.3 (*)    MCV 74.9 (*)    MCH 23.6 (*)    RDW 18.8 (*)    All other components within normal limits  I-STAT BETA HCG BLOOD, ED (MC, WL, AP ONLY) - Abnormal; Notable for the following components:   I-stat hCG, quantitative 6.1 (*)    All other components within normal limits  BRAIN NATRIURETIC PEPTIDE  TROPONIN I (HIGH SENSITIVITY)  TROPONIN I (HIGH SENSITIVITY)    EKG EKG  Interpretation  Date/Time:  Thursday October 15 2022 09:36:19 EST Ventricular Rate:  94 PR Interval:  142 QRS Duration: 90 QT Interval:  320 QTC Calculation: 400 R Axis:   15 Text Interpretation: Normal sinus rhythm Minimal voltage criteria for LVH, may be normal variant ( R in aVL When compared with ECG of 27-Jul-2022 10:38, PREVIOUS ECG IS PRESENT Confirmed by Cindee Lame [phone]) on 10/15/2022 6:45:04 PM  Radiology DG Chest 2 View  Result Date: 10/15/2022 CLINICAL DATA:  56 year old female with shortness of breath, chest and back pain. EXAM: CHEST - 2 VIEW COMPARISON:  Chest CTA 07/28/2022 and earlier. FINDINGS: AP and lateral views at 1028 hours. Lung volumes and mediastinal contours are within normal limits. Visualized tracheal air column is  within normal limits. Both lungs appear clear. No pneumothorax or pleural effusion. Chronic lower thoracic spine disc and endplate degeneration. No acute osseous abnormality identified. Paucity of bowel gas in the visible abdomen. IMPRESSION: No acute cardiopulmonary abnormality. Electronically Signed   By: Genevie Ann M.D.   On: 10/15/2022 10:45    Procedures Procedures    Medications Ordered in ED Medications  ondansetron (ZOFRAN) injection 4 mg (4 mg Intravenous Given 10/15/22 2036)    ED Course/ Medical Decision Making/ A&P                          Medical Decision Making Amount and/or Complexity of Data Reviewed Labs: ordered. Decision-making details documented in ED Course. Radiology:  Decision-making details documented in ED Course.  Risk Prescription drug management.    This patient presents to the ED for concern of CP, this involves an extensive number of treatment options, and is a complaint that carries with it a high risk of complications and morbidity.  I considered the following differential and admission for this acute, potentially life threatening condition.    MDM:    DDX for chest pain includes but is not limited  to:  ACS/arrhythmia,  PE, aortic dissection, PNA, biliary disease, cardiac tamponade, pericarditis, GERD/PUD/gastritis, or musculoskeletal pain. C/f ACS vs angina with patient's ischemic history and will obtain troponin. Will give nitroglycerin and reassess. Must consider aortic dissection given presenting sx, however I believe ACS/angina more likely. Will also get a CXR. Patient cannot PERC out based on age, but will obtain D dimer and reassess, minimal risk factors for PE. No abdominal pain and no c/f biliary disease.    Clinical Course as of 11/02/22 2011  Thu Oct 15, 2022  1844 Hemoglobin(!): 11.1 [HN]  1844 WBC: 5.5 [HN]  1844 Troponin I (High Sensitivity): 3 [HN]  1844 Creatinine(!): 1.25 [HN]  1844 B Natriuretic Peptide: 18.2 [HN]  1844 DG Chest 2 View FINDINGS: AP and lateral views at 1028 hours. Lung volumes and mediastinal contours are within normal limits. Visualized tracheal air column is within normal limits. Both lungs appear clear. No pneumothorax or pleural effusion. Chronic lower thoracic spine disc and endplate degeneration. No acute osseous abnormality identified. Paucity of bowel gas in the visible abdomen.  IMPRESSION: No acute cardiopulmonary abnormality.   [HN]  2249 Patient improved w/ nitroglycerin and states that her pain is now only a discomfort. She continues to take the ranolazine but doesn't have any nitro at home. Given her likely ongoing angina will consult to cardiology. [HN]  2326 D/w cardiology who felt patient safe to go home with nitroglycerin PRN for chest pain and can f/u in clinic.  [HN]  2337 D-Dimer, Quant(!): 0.71 Patient with 0 years criteria and D-dimer <1, DVT/PE safely excluded. [HN]  2338 Patient will be DC'd with Discharge instructions, return precautions, nitro SL PRN rx, and instructions to f/u with cardiology in <1 week. [HN]    Clinical Course User Index [HN] Audley Hose, MD     Labs: I Ordered, and personally interpreted  labs.  The pertinent results include:  those listed above  Imaging Studies ordered: I ordered imaging studies including CXR I independently visualized and interpreted imaging. I agree with the radiologist interpretation  Additional history obtained from chart review  Cardiac Monitoring: The patient was maintained on a cardiac monitor.  I personally viewed and interpreted the cardiac monitored which showed an underlying rhythm of: NSR  Reevaluation: After the  interventions noted above, I reevaluated the patient and found that they have :improved  Social Determinants of Health: Patient lives independently   Disposition:  Trop neg, CXR unremarkable, PE ruled out. Patient improved w/ nitro. Likely angina. DC w/ nitroglycerin rx per cardiology w/ cardiology f/u. DC w/ discharge instructions/return precautions, cardiology f/u.    Co morbidities that complicate the patient evaluation  Past Medical History:  Diagnosis Date   Anemia    CAD (coronary artery disease)    PCI with stent to RCA in 2019   CHF (congestive heart failure) ([place]) 04/2011   EF 15-20% from echo 05/19/11   DOE (dyspnea on exertion)    DVT (deep venous thrombosis) ([place]) 06/2011   LLE   Fatigue    HTN (hypertension)    Hyperlipidemia    Menorrhagia    with iron deficient anemia   NSTEMI (non-ST elevated myocardial infarction) ([place]) 10/26/2017   NSTEMI (non-ST elevated myocardial infarction) ([place]) 10/27/2017   Obesity    Orthopnea    Type II diabetes mellitus (HCC)      Medicines No orders of the defined types were placed in this encounter.   I have reviewed the patients home medicines and have made adjustments as needed  Problem List / ED Course: Problem List Items Addressed This Visit       Other   Chest pain - Primary              This note was created using dictation software, which may contain spelling or grammatical errors.    Audley Hose, MD 11/04/22 1556

## 2022-10-15 NOTE — ED Provider Triage Note (Signed)
Emergency Medicine Provider Triage Evaluation Note  [name] , a 56 y.o. female  was evaluated in triage.  Pt complains of chest pain, shortness of breath, lightheadedness, fatigue, dyspnea on exertion.  Patient reports it feels similar to previous chest pain leading to heart cath, patient with stents in place, currently taking brillinta. Denies fever, cough, sore throat, leg swelling, weight gain.  Review of Systems  Positive: Chest pain, shob, DOE Negative: Leg swelling, fever, chills  Physical Exam  BP 127/81 (BP Location: Right Arm)   Pulse 87   Temp 98.2 F (36.8 C)   Resp 16   Ht '5\' 2"'$  (1.575 m)   Wt 105.2 kg   LMP 06/16/2019   SpO2 100%   BMI 42.43 kg/m  Gen:   Awake, no distress   Resp:  Normal effort  MSK:   Moves extremities without difficulty  Other:  Ttp of chest wall, some rhonchi in upper lung fields noted  Medical Decision Making  Medically screening exam initiated at 10:03 AM.  Appropriate orders placed.  [name] was informed that the remainder of the evaluation will be completed by another provider, this initial triage assessment does not replace that evaluation, and the importance of remaining in the ED until their evaluation is complete.  Workup initiated   [name], [place] 10/15/22 1005

## 2022-10-15 NOTE — ED Triage Notes (Signed)
Pt. Stated, Ive had chest pain, SOB and light headedness for 2 days with some nausea off and on

## 2022-10-15 NOTE — Discharge Instructions (Signed)
Thank you for coming to [facility] Emergency Department. You were seen for chest pain. We did an exam, labs, and imaging, and these showed no acute findings. You were likely having cardiac chest pain, or angina. This improved with nitroglycerin. We discussed with cardiology who recommended a prescription for nitroglycerin to go home with to take as needed for chest pain. You can take one tablet under the tongue every 5 minutes up to 3 doses as needed for chest pain. If your chest is still hurting after 3 doses, please come to the emergency department.  Please follow up with your cardiologist within 1 week. Call them in the morning to make an appointment.  Do not hesitate to return to the ED or call 911 if you experience: -Worsening symptoms -Chest pain that does not improve after nitroglycerin 3 doses under the tongue -Lightheadedness, passing out -Fevers/chills -Anything else that concerns you

## 2022-10-16 ENCOUNTER — Ambulatory Visit: Payer: Medicaid Other | Admitting: Internal Medicine

## 2022-10-16 ENCOUNTER — Encounter: Payer: Self-pay | Admitting: Internal Medicine

## 2022-10-16 VITALS — BP 141/89 | HR 83 | Ht 62.0 in | Wt 232.4 lb

## 2022-10-16 DIAGNOSIS — I25118 Atherosclerotic heart disease of native coronary artery with other forms of angina pectoris: Secondary | ICD-10-CM

## 2022-10-16 DIAGNOSIS — I10 Essential (primary) hypertension: Secondary | ICD-10-CM

## 2022-10-16 DIAGNOSIS — E782 Mixed hyperlipidemia: Secondary | ICD-10-CM

## 2022-10-16 NOTE — Progress Notes (Signed)
Follow up visit  Subjective:   [name], female    DOB: Sep 30, 1966, 56 y.o.   MRN: [id number]     Chest Pain  Associated symptoms include shortness of breath. Pertinent negatives include no palpitations or syncope.  Shortness of Breath Associated symptoms include chest pain. Pertinent negatives include no leg swelling or syncope.    Chief Complaint  Patient presents with   Chest Pain   Follow-up   Hospitalization Follow-up   Shortness of Breath    56 y/o African-American female with hypertension, type 2 diabetes mellitus, CAD, postmenopausal vaginal bleeding, anemia  Patient was hospitalized in 07/2022 with worsening angina symptoms. Stress test showed large ischemia, details below. Patient was discussed at heart team meeting on 07/31/2022, details below. Symptoms of angina are once again worsening for her despite medication changes. She did well with the addition of Ranexa for a little while but now she is having angina all the times. She denies claudication, shortness of breath, palpitations, diaphoresis, syncope.    Current Outpatient Medications:    acetaminophen (TYLENOL) 325 MG tablet, Take 650 mg by mouth every 6 (six) hours as needed (pain.)., Disp: , Rfl:    amLODipine (NORVASC) 10 MG tablet, TAKE 1 TABLET(10 MG) BY MOUTH EVERY EVENING, Disp: 90 tablet, Rfl: 0   aspirin 81 MG chewable tablet, Chew 81 mg by mouth in the morning., Disp: , Rfl:    atorvastatin (LIPITOR) 40 MG tablet, Take 1 tablet (40 mg total) by mouth every evening., Disp: 90 tablet, Rfl: 3   empagliflozin (JARDIANCE) 10 MG TABS tablet, Take 1 tablet (10 mg total) by mouth daily., Disp: 90 tablet, Rfl: 1   Evolocumab (REPATHA SURECLICK) 140 MG/ML SOAJ, Inject 140 mg into the skin every 14 (fourteen) days., Disp: 2 mL, Rfl: 5   Ferrous Sulfate (IRON) 325 (65 FE) MG TABS, Take 325 mg by mouth 2 (two) times daily., Disp: , Rfl:    furosemide (LASIX) 40 MG tablet, Take 40 mg by mouth as needed.,  Disp: , Rfl:    isosorbide mononitrate (IMDUR) 30 MG 24 hr tablet, Take 1 tablet (30 mg total) by mouth daily., Disp: 90 tablet, Rfl: 1   losartan (COZAAR) 25 MG tablet, Take 1 tablet (25 mg total) by mouth daily., Disp: 90 tablet, Rfl: 1   metFORMIN (GLUCOPHAGE) 1000 MG tablet, Take 500 mg by mouth 2 (two) times daily with a meal., Disp: , Rfl:    metoprolol succinate (TOPROL-XL) 100 MG 24 hr tablet, Take 1 tablet (100 mg total) by mouth daily. Take with or immediately following a meal., Disp: 90 tablet, Rfl: 1   nitroGLYCERIN (NITROSTAT) 0.4 MG SL tablet, Place 1 tablet (0.4 mg total) under the tongue every 5 (five) minutes as needed for chest pain., Disp: 30 tablet, Rfl: 0   Semaglutide (OZEMPIC, 0.25 OR 0.5 MG/DOSE, [place]), Inject 0.5 mg into the skin every Sunday., Disp: , Rfl:    ticagrelor (BRILINTA) 90 MG TABS tablet, Take 1 tablet (90 mg total) by mouth 2 (two) times daily., Disp: 60 tablet, Rfl: 6   ranolazine (RANEXA) 1000 MG SR tablet, Take 1 tablet (1,000 mg total) by mouth 2 (two) times daily., Disp: 60 tablet, Rfl: 1   Cardiovascular & other pertient studies:  Reviewed external labs and tests, independently interpreted  EKG 08/10/2022: Sinus rhythm 95 bpm Diffuse nonspecific T-abnormality  Left Heart Catheterization 07/27/2022:  LV: 131/3, EDP 9 mmHg.  Ao 169/87, mean 119 mmHg.  No pressure gradient across the  aortic valve. RCA: Large and dominant.  Probably one of the large vessels for her.  Previously placed mid RCA stent from 2011 has again in-stent restenosis of 70 to 80%.  Proximal to the stent and distal to the stent.  A 2.75 x 32 mm and a 2.75 x 20 mm Synergy XT DES stents are present and widely patent except at the focal prior stent area which was previously called on 03/10/2022. The PL branch which is large has a focal 90% stenosis in the proximal segment. LM: Moderate caliber vessel, no significant disease. LAD: Moderate caliber vessel with mild diffuse disease.  D1 is  small.  D2 and D3 arise close by to each other and moderate vessels. LCx: Moderate caliber vessel.  Ostium is a 30 to 40% stenosis.  Gives origin to a small caliber however large territory OM1 with secondary lateral branch.  There is restenosis in the entire proximal and mid segment.  Balloon angioplasty site performed 05/26/2022 with 2 mm balloon. Distal circumflex is small, has tandem 70% stenosis.      Impression: Recurrent restenosis of 70 to 80% in the mid RCA at the site of previous stenosis which is covered by DES.  There is new progression of PL branch stenosis from previous 60 to 70% to the present 90 to 95%.   There is restenosis at the balloon angioplasty site of the large distribution however diffusely diseased small calibered OM1 which has a large lateral branch.   I am concerned about recurrence of restenosis in the right coronary artery and also concerned about balloon angioplasty results of OM1 again as it is small and unable to be stented.   As she has class III angina pectoris, we could optimize her medically with aggressive weight loss, control of hypertension, aggressive lipid status control and add Ranexa.  Could consider two-vessel CABG to RCA and OM.  I will also present her case to heart team for collective opinion.  130 mL contrast utilized.   Coronary intervention 03/10/2022:  LV: 168/80, EDP 19 mmHg.  Ao 196/90, mean 134 mmHg.  No pressure gradient across the aortic valve. LM: Moderate caliber vessel.  No significant disease. LAD: Moderate caliber vessel throughout.  Mild diffuse disease.  Gives origin to several small diagonals, diagonal 2 and 3 which arises as bifurcation in the midsegment of the LAD have mild ostial disease.  Mid to distal segment of the LAD has a focal 40% stenosis. CX: Again a moderate caliber vessel giving origin to a very large territory distribution OM1, a 2.0 x 2.25 mm vessel with a high-grade ulcerated proximal tandem 95 to 99% stenosis.  OM 2 is  very small caliber and again, moderate area of distribution, ostial 50% stenosis.  Distal circumflex has a 40% stenosis. RCA: The mid segment of the RCA has a stent from 2013 and a 2.75 x 23 mm Xience stent from 10/28/2019.  These are overlapping.  The candy wrapper stenosis both at the inflow and outflow of the old stent from 2013 (proximal ISR, distal new lesion).  There is a 60 to 70% proximal stenosis starting from the proximal segment of the previously placed stent from 2021.   Interventional data: Difficult procedure due to poor visualization and resilient in-stent restenosis.  2.75 x 15 mm score flex balloon angioplasty at 20 atmospheric pressure followed by stenting with a 2.75 x 32 mm Synergy XT DES covering the entire in-stent restenosis to outflow stenosis of previously placed stent from 2013. Successful stenting of  the proximal to mid RCA overlapping stent that was placed on 10/28/2019 and the distal edge with a 2.74 x 20 mm Synergy XD deployed at 16 atmospheric pressure.  The entire stented segment was postdilated to 20 atm pressure giving a 3 mm lumen.  Recommendation: Patient extremely high risk for recurrent restenosis and progression of coronary disease in view of uncontrolled hypertension, diabetes mellitus and noncompliance with medical advice.  Reiterate risks to the patient.  Aspirin indefinitely and Brilinta for at least 1 year in view of NSTEMI.  Patient will be scheduled for elective stage intervention to a very large OM1 tomorrow if she remains stable through the right.  Although OM1 is 2.0-2.25 mm vessel, has a very large area of distribution and a high-grade ulcerated 99% stenosis.  She is at risk for developing recurrent ACS if untreated.  175 mL contrast utilized  Nuclear stress test 07/26/2022: 1. Large reversible myocardial perfusion defect throughout the anterior, apical, and lateral walls of the left ventricle, consistent with myocardial ischemia. 2. Mild apical and lateral  wall hypokinesis. 3. Left ventricular ejection fraction 58% 4. Non invasive risk stratification*: High  Recent labs: 07/28/2022: Glucose 154, BUN/Cr 14/1.06. EGFR >60. Na/K 139/4.2. Rest of the CMP normal H/H 8.6/26.3. MCV 68. Platelets 340 Chol 105, TG 71, HDL 57, LDL 67  05/27/2022: Chol 143, TG 69, HDL 35, LDL 94  04/02/2022: Glucose 242, BUN/Cr 17/0.96. EGFR >60. Na/K 139/4.1.  H/H 8.2/25. MCV 76. Platelets 315 HbA1C 8.7% Lipid panel NA Lipoprotein a 196  Latest Reference Range & Units 04/02/22 15:48 04/02/22 16:08  Troponin I (High Sensitivity) <18 ng/L 11 10     Review of Systems  Cardiovascular:  Positive for chest pain. Negative for dyspnea on exertion, leg swelling, palpitations and syncope.  Respiratory:  Positive for shortness of breath.        Vitals:   10/16/22 1043  BP: (!) 141/89  Pulse: 83  SpO2: 99%    Body mass index is 42.51 kg/m. Filed Weights   10/16/22 1043  Weight: 232 lb 6.4 oz (105.4 kg)     Objective:   Physical Exam Vitals and nursing note reviewed.  Constitutional:      General: She is not in acute distress. Neck:     Vascular: No JVD.  Cardiovascular:     Rate and Rhythm: Normal rate and regular rhythm.     Heart sounds: Normal heart sounds. No murmur heard. Pulmonary:     Effort: Pulmonary effort is normal.     Breath sounds: Normal breath sounds. No wheezing or rales.  Musculoskeletal:     Right lower leg: No edema.     Left lower leg: No edema.             Visit diagnoses:   ICD-10-CM   1. Coronary artery disease of native artery of native heart with stable angina pectoris (HCC)  I25.118 EKG 12-Lead    CBC    Basic metabolic panel    2. Mixed hyperlipidemia  E78.2     3. Essential hypertension  I10         Assessment & Recommendations:   56 y/o African-American female with hypertension, type 2 diabetes mellitus, CAD, postmenopausal vaginal bleeding, anemia  CAD: Anterior, apical, and lateral walls of  the left ventricle, consistent with myocardial ischemia. (07/2022). While this would suggest possible LAD disease, no significant LAD disease on noted on cath (07/2022). Complex CAD with residual severe prox RCA disease, mid RCA ISR, severe  OM1 disease. Discussed in heart team meeting on 07/31/2022. No targets for CABG. OM1 too small to have sustainable PCI solution. Prox RCA and mid RCA PCI most likely to be beneficial, if angina on optimal medical therapy. At this time, angina symptoms are worse and will schedule for LHC LDL down to 67 on Crestor and Repatha. Continue DAPT with Aspirin and Brilinta at least till 07/2022. Jardiance denies. Given CAD, diabetes, recommend Jardiance. Will appeal.  Mixed hyperlipidemia: As above  Hypertension: Well-controlled.  F/u in 1 months     Clotilde Dieter, [place], [facility] Pager: [phone] Office: [phone]

## 2022-10-20 ENCOUNTER — Other Ambulatory Visit (HOSPITAL_COMMUNITY): Payer: Self-pay

## 2022-10-20 ENCOUNTER — Ambulatory Visit (HOSPITAL_COMMUNITY)
Admission: RE | Admit: 2022-10-20 | Discharge: 2022-10-20 | Disposition: A | Discharge status: Home / Self Care | Payer: Medicaid Other | Source: Ambulatory Visit | Attending: Cardiology | Admitting: Cardiology

## 2022-10-20 ENCOUNTER — Encounter (HOSPITAL_COMMUNITY)
Admission: RE | Disposition: A | Discharge status: Home / Self Care | Payer: Self-pay | Source: Ambulatory Visit | Attending: Cardiology

## 2022-10-20 DIAGNOSIS — Z7985 Long-term (current) use of injectable non-insulin antidiabetic drugs: Secondary | ICD-10-CM | POA: Insufficient documentation

## 2022-10-20 DIAGNOSIS — Z955 Presence of coronary angioplasty implant and graft: Secondary | ICD-10-CM

## 2022-10-20 DIAGNOSIS — E119 Type 2 diabetes mellitus without complications: Secondary | ICD-10-CM | POA: Diagnosis not present

## 2022-10-20 DIAGNOSIS — Z7984 Long term (current) use of oral hypoglycemic drugs: Secondary | ICD-10-CM | POA: Diagnosis not present

## 2022-10-20 DIAGNOSIS — I25118 Atherosclerotic heart disease of native coronary artery with other forms of angina pectoris: Secondary | ICD-10-CM | POA: Diagnosis present

## 2022-10-20 DIAGNOSIS — Z780 Asymptomatic menopausal state: Secondary | ICD-10-CM | POA: Insufficient documentation

## 2022-10-20 DIAGNOSIS — E782 Mixed hyperlipidemia: Secondary | ICD-10-CM | POA: Diagnosis not present

## 2022-10-20 DIAGNOSIS — I10 Essential (primary) hypertension: Secondary | ICD-10-CM | POA: Diagnosis not present

## 2022-10-20 HISTORY — PX: CORONARY ULTRASOUND/IVUS: CATH118244

## 2022-10-20 HISTORY — PX: LEFT HEART CATH AND CORONARY ANGIOGRAPHY: CATH118249

## 2022-10-20 HISTORY — PX: CORONARY STENT INTERVENTION: CATH118234

## 2022-10-20 LAB — GLUCOSE, CAPILLARY
Glucose-Capillary: 159 mg/dL — ABNORMAL HIGH (ref 70–99)
Glucose-Capillary: 96 mg/dL (ref 70–99)

## 2022-10-20 LAB — POCT ACTIVATED CLOTTING TIME
Activated Clotting Time: 428 seconds
Activated Clotting Time: 309 seconds
Activated Clotting Time: 580 seconds

## 2022-10-20 SURGERY — LEFT HEART CATH AND CORONARY ANGIOGRAPHY
Anesthesia: LOCAL

## 2022-10-20 MED ORDER — SODIUM CHLORIDE 0.9 % IV SOLN: INTRAVENOUS | Status: AC

## 2022-10-20 MED ORDER — IOHEXOL 350 MG/ML SOLN
INTRAVENOUS | Status: DC | PRN
  Administered 2022-10-20: 130 mL

## 2022-10-20 MED ORDER — MIDAZOLAM HCL 2 MG/2ML IJ SOLN
INTRAMUSCULAR | Status: DC | PRN
  Administered 2022-10-20 (×3): 1 mg via INTRAVENOUS

## 2022-10-20 MED ORDER — SODIUM CHLORIDE 0.9% FLUSH: 3.0000 mL | Freq: Two times a day (BID) | INTRAVENOUS | Status: DC

## 2022-10-20 MED ORDER — SODIUM CHLORIDE 0.9 % IV SOLN: 250.0000 mL | INTRAVENOUS | Status: DC | PRN

## 2022-10-20 MED ORDER — SODIUM CHLORIDE 0.9% FLUSH: 3.0000 mL | INTRAVENOUS | Status: DC | PRN

## 2022-10-20 MED ORDER — NITROGLYCERIN 1 MG/10 ML FOR IR/CATH LAB
INTRA_ARTERIAL | Status: AC
  Filled 2022-10-20: qty 10

## 2022-10-20 MED ORDER — LABETALOL HCL 5 MG/ML IV SOLN: 10.0000 mg | INTRAVENOUS | Status: DC | PRN

## 2022-10-20 MED ORDER — HEPARIN SODIUM (PORCINE) 1000 UNIT/ML IJ SOLN
INTRAMUSCULAR | Status: DC | PRN
  Administered 2022-10-20: 5000 [IU] via INTRAVENOUS
  Administered 2022-10-20: 2000 [IU] via INTRAVENOUS
  Administered 2022-10-20: 5000 [IU] via INTRAVENOUS

## 2022-10-20 MED ORDER — HEPARIN (PORCINE) IN NACL 1000-0.9 UT/500ML-% IV SOLN
INTRAVENOUS | Status: AC
  Filled 2022-10-20: qty 1000

## 2022-10-20 MED ORDER — FENTANYL CITRATE (PF) 100 MCG/2ML IJ SOLN
INTRAMUSCULAR | Status: DC | PRN
  Administered 2022-10-20: 25 ug via INTRAVENOUS
  Administered 2022-10-20: 50 ug via INTRAVENOUS
  Administered 2022-10-20: 25 ug via INTRAVENOUS

## 2022-10-20 MED ORDER — LIDOCAINE HCL (PF) 1 % IJ SOLN
INTRAMUSCULAR | Status: DC | PRN
  Administered 2022-10-20: 2 mL

## 2022-10-20 MED ORDER — MIDAZOLAM HCL 2 MG/2ML IJ SOLN
INTRAMUSCULAR | Status: AC
  Filled 2022-10-20: qty 2

## 2022-10-20 MED ORDER — HEPARIN SODIUM (PORCINE) 1000 UNIT/ML IJ SOLN
INTRAMUSCULAR | Status: AC
  Filled 2022-10-20: qty 10

## 2022-10-20 MED ORDER — ACETAMINOPHEN 325 MG PO TABS: 650.0000 mg | ORAL_TABLET | ORAL | Status: DC | PRN

## 2022-10-20 MED ORDER — SODIUM CHLORIDE 0.9 % WEIGHT BASED INFUSION
3.0000 mL/kg/h | INTRAVENOUS | Status: AC
  Administered 2022-10-20: 3 mL/kg/h via INTRAVENOUS

## 2022-10-20 MED ORDER — ONDANSETRON HCL 4 MG/2ML IJ SOLN: 4.0000 mg | Freq: Four times a day (QID) | INTRAMUSCULAR | Status: DC | PRN

## 2022-10-20 MED ORDER — HYDRALAZINE HCL 20 MG/ML IJ SOLN: 10.0000 mg | INTRAMUSCULAR | Status: DC | PRN

## 2022-10-20 MED ORDER — LIDOCAINE HCL (PF) 1 % IJ SOLN
INTRAMUSCULAR | Status: AC
  Filled 2022-10-20: qty 30

## 2022-10-20 MED ORDER — ASPIRIN 81 MG PO CHEW: 81.0000 mg | CHEWABLE_TABLET | ORAL | Status: DC

## 2022-10-20 MED ORDER — HEPARIN (PORCINE) IN NACL 1000-0.9 UT/500ML-% IV SOLN
INTRAVENOUS | Status: DC | PRN
  Administered 2022-10-20 (×3): 500 mL

## 2022-10-20 MED ORDER — VERAPAMIL HCL 2.5 MG/ML IV SOLN
INTRAVENOUS | Status: AC
  Filled 2022-10-20: qty 2

## 2022-10-20 MED ORDER — SODIUM CHLORIDE 0.9 % WEIGHT BASED INFUSION: 1.0000 mL/kg/h | INTRAVENOUS | Status: DC

## 2022-10-20 MED ORDER — HEPARIN (PORCINE) IN NACL 1000-0.9 UT/500ML-% IV SOLN
INTRAVENOUS | Status: AC
  Filled 2022-10-20: qty 500

## 2022-10-20 MED ORDER — VERAPAMIL HCL 2.5 MG/ML IV SOLN
INTRAVENOUS | Status: DC | PRN
  Administered 2022-10-20: 10 mL via INTRA_ARTERIAL

## 2022-10-20 MED ORDER — NITROGLYCERIN 1 MG/10 ML FOR IR/CATH LAB
INTRA_ARTERIAL | Status: DC | PRN
  Administered 2022-10-20 (×2): 200 ug

## 2022-10-20 MED ORDER — FENTANYL CITRATE (PF) 100 MCG/2ML IJ SOLN
INTRAMUSCULAR | Status: AC
  Filled 2022-10-20: qty 2

## 2022-10-20 SURGICAL SUPPLY — 29 items
BALL SAPPHIRE NC24 3.5X15 (BALLOONS) ×1
BALL SAPPHIRE NC24 4.0X12 (BALLOONS) ×1
BALLN EMERGE MR 2.5X15 (BALLOONS) ×1
BALLN SCOREFLEX 3.0X20 (BALLOONS) ×1
BALLOON EMERGE MR 2.5X15 (BALLOONS) IMPLANT
BALLOON SAPPHIRE NC24 3.5X15 (BALLOONS) IMPLANT
BALLOON SAPPHIRE NC24 4.0X12 (BALLOONS) IMPLANT
BALLOON SCOREFLEX 3.0X20 (BALLOONS) IMPLANT
CATH INFINITI 5 FR JL3.5 (CATHETERS) IMPLANT
CATH LAUNCHER 5F EBU3.0 (CATHETERS) IMPLANT
CATH LAUNCHER 6FR AL.75 (CATHETERS) IMPLANT
CATH OPTICROSS HD (CATHETERS) IMPLANT
CATH OPTITORQUE TIG 4.0 5F (CATHETERS) IMPLANT
CATHETER LAUNCHER 5F EBU3.0 (CATHETERS) ×1
DEVICE RAD TR BAND REGULAR (VASCULAR PRODUCTS) IMPLANT
GLIDESHEATH SLEND A-KIT 6F 22G (SHEATH) IMPLANT
GUIDEWIRE INQWIRE 1.5J.035X260 (WIRE) IMPLANT
INQWIRE 1.5J .035X260CM (WIRE) ×1
KIT ENCORE 26 ADVANTAGE (KITS) IMPLANT
KIT HEART LEFT (KITS) ×1 IMPLANT
PACK CARDIAC CATHETERIZATION (CUSTOM PROCEDURE TRAY) ×1 IMPLANT
SHEATH PROBE COVER 6X72 (BAG) IMPLANT
SLED PULL BACK IVUS (MISCELLANEOUS) IMPLANT
STENT SYNERGY XD 3.0X28 (Permanent Stent) IMPLANT
SYNERGY XD 3.0X28 (Permanent Stent) ×1 IMPLANT
TRANSDUCER W/STOPCOCK (MISCELLANEOUS) ×1 IMPLANT
TUBING CIL FLEX 10 FLL-RA (TUBING) ×1 IMPLANT
VALVE GUARDIAN II ~~LOC~~ HEMO (MISCELLANEOUS) IMPLANT
WIRE ASAHI PROWATER 180CM (WIRE) IMPLANT

## 2022-10-20 NOTE — CV Procedure (Signed)
IVUS guided ostial to mid RCA PCI Full report to follow Same day discharge   [name], MD Pager: [phone] Office: [phone]

## 2022-10-20 NOTE — Progress Notes (Addendum)
CARDIAC REHAB PHASE I     Post stent education including site care, restrictions, risk factors, heart healthy diabetic diet, antiplatelet therapy importance, exercise guidelines and CRP2 reviewed. All questions and concerns addressed. Will refer to [facility] for CRP2. Plan for home today.   [id number]  Vanessa Barbara, RN BSN 10/20/2022 3:22 PM

## 2022-10-20 NOTE — Interval H&P Note (Signed)
History and Physical Interval Note:  10/20/2022 12:16 PM  [name]  has presented today for surgery, with the diagnosis of coronary artery disease, unstable angina.  The various methods of treatment have been discussed with the patient and family. After consideration of risks, benefits and other options for treatment, the patient has consented to  Procedure(s): LEFT HEART CATH AND CORONARY ANGIOGRAPHY (N/A) as a surgical intervention.  The patient's history has been reviewed, patient examined, no change in status, stable for surgery.  I have reviewed the patient's chart and labs.  Questions were answered to the patient's satisfaction.    2016/2017 Appropriate Use Criteria for Coronary Revascularization Symptom Status: Ischemic Symptoms  Non-invasive Testing: High risk  If no or indeterminate stress test, FFR/iFR results in all diseased vessels: N/A  Diabetes Mellitus: Yes  S/P CABG: No  Antianginal therapy (number of long-acting drugs): >=2  Patient undergoing renal transplant: No  Patient undergoing percutaneous valve procedure: No  1 Vessel Disease PCI CABG  No proximal LAD involvement, No proximal left dominant LCX involvement A (8); Indication 2 M (6); Indication 2  Proximal left dominant LCX involvement A (8); Indication 5 A (8); Indication 5  Proximal LAD involvement A (8); Indication 5 A (8); Indication 5  2 Vessel Disease  No proximal LAD involvement A (8); Indication 8 A (7); Indication 8  Proximal LAD involvement A (8); Indication 14 A (9); Indication 14  3 Vessel Disease  Low disease complexity (e.g., focal stenoses, SYNTAX <=22) A (7); Indication 19 A (9); Indication 19  Intermediate or high disease complexity (e.g., SYNTAX >=23) M (6); Indication 23 A (9); Indication 23  Left Main Disease  Isolated LMCA disease: ostial or midshaft A (7); Indication 24 A (9); Indication 24  Isolated LMCA disease: bifurcation involvement M (6); Indication 25 A (9); Indication 25  LMCA  ostial or midshaft, concurrent low disease burden multivessel disease (e.g., 1-2 additional focal stenoses, SYNTAX <=22) A (7); Indication 26 A (9); Indication 26  LMCA ostial or midshaft, concurrent intermediate or high disease burden multivessel disease (e.g., 1-2 additional bifurcation stenoses, long stenoses, SYNTAX >=23) M (4); Indication 27 A (9); Indication 27  LMCA bifurcation involvement, concurrent low disease burden multivessel disease (e.g., 1-2 additional focal stenoses, SYNTAX <=22) M (6); Indication 28 A (9); Indication 28  LMCA bifurcation involvement, concurrent intermediate or high disease burden multivessel disease (e.g., 1-2 additional bifurcation stenoses, long stenoses, SYNTAX >=23) R (3); Indication 29 A (9); Indication [place]

## 2022-10-20 NOTE — H&P (Signed)
OV 10/16/2022 copied for documentation    Follow up visit  Subjective:   [name], female    DOB: Mar 11, 1966, 57 y.o.   MRN: [id number]     HPI  Chief Complaint  Patient presents with   Coronary Artery Disease   Follow-up    3 month   Results    labs    57 y/o African-American female with hypertension, type 2 diabetes mellitus, CAD, postmenopausal vaginal bleeding, anemia  Patient was hospitalized in 07/2022 with worsening angina symptoms. Stress test showed large ischemia, details below. Patient was discussed at heart team meeting on 07/31/2022, details below. Symptoms of angina have improved since being on Ranexa. She is walking more. She has lost 17 lbs.     Current Outpatient Medications:    acetaminophen (TYLENOL) 325 MG tablet, Take 650 mg by mouth every 6 (six) hours as needed (pain.)., Disp: , Rfl:    amLODipine (NORVASC) 10 MG tablet, Take 1 tablet (10 mg total) by mouth every evening., Disp: 30 tablet, Rfl: 1   aspirin 81 MG chewable tablet, Chew 81 mg by mouth in the morning., Disp: , Rfl:    atorvastatin (LIPITOR) 40 MG tablet, Take 1 tablet (40 mg total) by mouth every evening., Disp: 90 tablet, Rfl: 3   empagliflozin (JARDIANCE) 10 MG TABS tablet, Take 1 tablet (10 mg total) by mouth daily., Disp: 30 tablet, Rfl: 1   Evolocumab (REPATHA SURECLICK) 062 MG/ML SOAJ, Inject 140 mg into the skin every 14 (fourteen) days., Disp: 2 mL, Rfl: 5   Ferrous Sulfate (IRON) 325 (65 FE) MG TABS, Take 325 mg by mouth 2 (two) times daily., Disp: , Rfl:    isosorbide mononitrate (IMDUR) 30 MG 24 hr tablet, Take 1 tablet (30 mg total) by mouth daily., Disp: 30 tablet, Rfl: 1   losartan (COZAAR) 25 MG tablet, Take 1 tablet (25 mg total) by mouth daily., Disp: 30 tablet, Rfl: 1   metFORMIN (GLUCOPHAGE) 1000 MG tablet, Take 1,000 mg by mouth 2 (two) times daily with a meal., Disp: , Rfl:    metoprolol succinate (TOPROL-XL) 100 MG 24 hr tablet, Take 1 tablet (100 mg total) by  mouth daily. Take with or immediately following a meal., Disp: 30 tablet, Rfl: 1   nitroGLYCERIN (NITROSTAT) 0.3 MG SL tablet, Place 0.3 mg under the tongue every 5 (five) minutes as needed for chest pain., Disp: , Rfl:    ranolazine (RANEXA) 1000 MG SR tablet, Take 1 tablet (1,000 mg total) by mouth 2 (two) times daily., Disp: 60 tablet, Rfl: 1   Semaglutide (OZEMPIC, 0.25 OR 0.5 MG/DOSE, [place]), Inject 0.5 mg into the skin every Sunday., Disp: , Rfl:    ticagrelor (BRILINTA) 90 MG TABS tablet, Take 1 tablet (90 mg total) by mouth 2 (two) times daily., Disp: 60 tablet, Rfl: 6   Cardiovascular & other pertient studies:  Reviewed external labs and tests, independently interpreted  EKG 08/10/2022: Sinus rhythm 95 bpm Diffuse nonspecific T-abnormality  Left Heart Catheterization 07/27/2022:  LV: 131/3, EDP 9 mmHg.  Ao 169/87, mean 119 mmHg.  No pressure gradient across the aortic valve. RCA: Large and dominant.  Probably one of the large vessels for her.  Previously placed mid RCA stent from 2011 has again in-stent restenosis of 70 to 80%.  Proximal to the stent and distal to the stent.  A 2.75 x 32 mm and a 2.75 x 20 mm Synergy XT DES stents are present and widely patent except at the focal  prior stent area which was previously called on 03/10/2022. The PL branch which is large has a focal 90% stenosis in the proximal segment. LM: Moderate caliber vessel, no significant disease. LAD: Moderate caliber vessel with mild diffuse disease.  D1 is small.  D2 and D3 arise close by to each other and moderate vessels. LCx: Moderate caliber vessel.  Ostium is a 30 to 40% stenosis.  Gives origin to a small caliber however large territory OM1 with secondary lateral branch.  There is restenosis in the entire proximal and mid segment.  Balloon angioplasty site performed 05/26/2022 with 2 mm balloon. Distal circumflex is small, has tandem 70% stenosis.      Impression: Recurrent restenosis of 70 to 80% in the mid  RCA at the site of previous stenosis which is covered by DES.  There is new progression of PL branch stenosis from previous 60 to 70% to the present 90 to 95%.   There is restenosis at the balloon angioplasty site of the large distribution however diffusely diseased small calibered OM1 which has a large lateral branch.   I am concerned about recurrence of restenosis in the right coronary artery and also concerned about balloon angioplasty results of OM1 again as it is small and unable to be stented.   As she has class III angina pectoris, we could optimize her medically with aggressive weight loss, control of hypertension, aggressive lipid status control and add Ranexa.  Could consider two-vessel CABG to RCA and OM.  I will also present her case to heart team for collective opinion.  130 mL contrast utilized.   Coronary intervention 03/10/2022:  LV: 168/80, EDP 19 mmHg.  Ao 196/90, mean 134 mmHg.  No pressure gradient across the aortic valve. LM: Moderate caliber vessel.  No significant disease. LAD: Moderate caliber vessel throughout.  Mild diffuse disease.  Gives origin to several small diagonals, diagonal 2 and 3 which arises as bifurcation in the midsegment of the LAD have mild ostial disease.  Mid to distal segment of the LAD has a focal 40% stenosis. CX: Again a moderate caliber vessel giving origin to a very large territory distribution OM1, a 2.0 x 2.25 mm vessel with a high-grade ulcerated proximal tandem 95 to 99% stenosis.  OM 2 is very small caliber and again, moderate area of distribution, ostial 50% stenosis.  Distal circumflex has a 40% stenosis. RCA: The mid segment of the RCA has a stent from 2013 and a 2.75 x 23 mm Xience stent from 10/28/2019.  These are overlapping.  The candy wrapper stenosis both at the inflow and outflow of the old stent from 2013 (proximal ISR, distal new lesion).  There is a 60 to 70% proximal stenosis starting from the proximal segment of the previously placed  stent from 2021.   Interventional data: Difficult procedure due to poor visualization and resilient in-stent restenosis.  2.75 x 15 mm score flex balloon angioplasty at 20 atmospheric pressure followed by stenting with a 2.75 x 32 mm Synergy XT DES covering the entire in-stent restenosis to outflow stenosis of previously placed stent from 2013. Successful stenting of the proximal to mid RCA overlapping stent that was placed on 10/28/2019 and the distal edge with a 2.74 x 20 mm Synergy XD deployed at 16 atmospheric pressure.  The entire stented segment was postdilated to 20 atm pressure giving a 3 mm lumen.  Recommendation: Patient extremely high risk for recurrent restenosis and progression of coronary disease in view of uncontrolled hypertension, diabetes mellitus  and noncompliance with medical advice.  Reiterate risks to the patient.  Aspirin indefinitely and Brilinta for at least 1 year in view of NSTEMI.  Patient will be scheduled for elective stage intervention to a very large OM1 tomorrow if she remains stable through the right.  Although OM1 is 2.0-2.25 mm vessel, has a very large area of distribution and a high-grade ulcerated 99% stenosis.  She is at risk for developing recurrent ACS if untreated.  175 mL contrast utilized  Nuclear stress test 07/26/2022: 1. Large reversible myocardial perfusion defect throughout the anterior, apical, and lateral walls of the left ventricle, consistent with myocardial ischemia. 2. Mild apical and lateral wall hypokinesis. 3. Left ventricular ejection fraction 58% 4. Non invasive risk stratification*: High  Recent labs: 07/28/2022: Glucose 154, BUN/Cr 14/1.06. EGFR >60. Na/K 139/4.2. Rest of the CMP normal H/H 8.6/26.3. MCV 68. Platelets 340 Chol 105, TG 71, HDL 57, LDL 67  05/27/2022: Chol 143, TG 69, HDL 35, LDL 94  04/02/2022: Glucose 242, BUN/Cr 17/0.96. EGFR >60. Na/K 139/4.1.  H/H 8.2/25. MCV 76. Platelets 315 HbA1C 8.7% Lipid panel  NA Lipoprotein a 196  Latest Reference Range & Units 04/02/22 15:48 04/02/22 16:08  Troponin I (High Sensitivity) <18 ng/L 11 10     Review of Systems  Cardiovascular:  Negative for chest pain, dyspnea on exertion, leg swelling, palpitations and syncope.       Vitals:   08/31/22 1109  BP: (!) 143/83  Pulse: 90  Resp: 16  SpO2: 96%    Body mass index is 43.71 kg/m. Filed Weights   08/31/22 1109  Weight: 239 lb (108.4 kg)     Objective:   Physical Exam Vitals and nursing note reviewed.  Constitutional:      General: She is not in acute distress. Neck:     Vascular: No JVD.  Cardiovascular:     Rate and Rhythm: Normal rate and regular rhythm.     Heart sounds: Normal heart sounds. No murmur heard. Pulmonary:     Effort: Pulmonary effort is normal.     Breath sounds: Normal breath sounds. No wheezing or rales.  Musculoskeletal:     Right lower leg: No edema.     Left lower leg: No edema.             Visit diagnoses:   ICD-10-CM   1. Coronary artery disease of native artery of native heart with stable angina pectoris ([place])  I25.118     2. Essential hypertension  I10     3. Mixed hyperlipidemia  E78.2         Assessment & Recommendations:   57 y/o African-American female with hypertension, type 2 diabetes mellitus, CAD, postmenopausal vaginal bleeding, anemia  CAD: Anterior, apical, and lateral walls of the left ventricle, consistent with myocardial ischemia. (07/2022). While this would suggest possible LAD disease, no significant LAD disease on noted on cath (07/2022). Complex CAD with residual severe prox RCA disease, mid RCA ISR, severe OM1 disease. Discussed in heart team meeting on 07/31/2022. No targets for CABG. OM1 too small to have sustainable PCI solution. Prox RCA and mid RCA PCI most likely to be beneficial, if angina on optimal medical therapy. At this time, angina symptoms have improved on Ranexa 1000 mg bid. Continue the same for  now. LDL down to 67 on Crestor and Repatha. Continue DAPT with Aspirin and Brilinta at least till 07/2022. Jardiance denies. Given CAD, diabetes, recommend Jardiance. Will appeal.  Mixed hyperlipidemia: As above  Hypertension: Well-controlled.  F/u in 3 months     [name], MD Pager: [phone] Office: [phone]

## 2022-10-20 NOTE — Progress Notes (Signed)
Patient stated that she took her Metformin 500 mg this morning at 0715 and that she has been having some SOB since Thursday. Rennis Harding, RN made aware. Instructed patient to hold Metformin 48 hours after procedure. Patient verbalized understanding.

## 2022-10-20 NOTE — Discharge Instructions (Addendum)
Information about your medication: Brilinta (anti-platelet agent)  Generic Name (Brand): ticagrelor (Brilinta), twice daily medication  PURPOSE: You are taking this medication along with aspirin to lower your chance of having a heart attack, stroke, or blood clots in your heart stent. These can be fatal. Brilinta and aspirin help prevent platelets from sticking together and forming a clot that can block an artery or your stent.   Common SIDE EFFECTS you may experience include: bruising or bleeding more easily, shortness of breath  Do not stop taking BRILINTA without talking to the doctor who prescribes it for you. People who are treated with a stent and stop taking Brilinta too soon, have a higher risk of getting a blood clot in the stent, having a heart attack, or dying. If you stop Brilinta because of bleeding, or for other reasons, your risk of a heart attack or stroke may increase.   Avoid taking NSAID agents or anti-inflammatory medications such as ibuprofen, naproxen given increased bleed risk with plavix - can use acetaminophen (Tylenol) if needed for pain.  Tell all of your doctors and dentists that you are taking Brilinta. They should talk to the doctor who prescribed Brilinta for you before you have any surgery or invasive procedure.   Contact your health care provider if you experience: severe or uncontrollable bleeding, pink/red/brown urine, vomiting blood or vomit that looks like "coffee grounds", red or black stools (looks like tar), coughing up blood or blood clots ---------------------------------------------------------------------------------------------------------------------- Drink plenty of fluids for 48 hours and keep wrist elevated at heart level for 24 hours  Radial Site Care   This sheet gives you information about how to care for yourself after your procedure. Your health care provider may also give you more specific instructions. If you have problems or questions,  contact your health care provider. What can I expect after the procedure? After the procedure, it is common to have: Bruising and tenderness at the catheter insertion area. Follow these instructions at home: Medicines Take over-the-counter and prescription medicines only as told by your health care provider. Insertion site care Follow instructions from your health care provider about how to take care of your insertion site. Make sure you: Wash your hands with soap and water before you change your bandage (dressing). If soap and water are not available, use hand sanitizer. Remove your dressing as told by your health care provider. In 24 hours Check your insertion site every day for signs of infection. Check for: Redness, swelling, or pain. Fluid or blood. Pus or a bad smell. Warmth. Do not take baths, swim, or use a hot tub until your health care provider approves. You may shower 24-48 hours after the procedure, or as directed by your health care provider. Remove the dressing and gently wash the site with plain soap and water. Pat the area dry with a clean towel. Do not rub the site. That could cause bleeding. Do not apply powder or lotion to the site. Activity   For 24 hours after the procedure, or as directed by your health care provider: Do not flex or bend the affected arm. Do not push or pull heavy objects with the affected arm. Do not drive yourself home from the hospital or clinic. You may drive 24 hours after the procedure unless your health care provider tells you not to. Do not operate machinery or power tools. Do not lift anything that is heavier than 10 lb (4.5 kg), or the limit that you are told, until your health  care provider says that it is safe.  For 4 days Ask your health care provider when it is okay to: Return to work or school. Resume usual physical activities or sports. Resume sexual activity. General instructions If the catheter site starts to bleed, raise  your arm and put firm pressure on the site. If the bleeding does not stop, get help right away. This is a medical emergency. If you went home on the same day as your procedure, a responsible adult should be with you for the first 24 hours after you arrive home. Keep all follow-up visits as told by your health care provider. This is important. Contact a health care provider if: You have a fever. You have redness, swelling, or yellow drainage around your insertion site. Get help right away if: You have unusual pain at the radial site. The catheter insertion area swells very fast. The insertion area is bleeding, and the bleeding does not stop when you hold steady pressure on the area. Your arm or hand becomes pale, cool, tingly, or numb. These symptoms may represent a serious problem that is an emergency. Do not wait to see if the symptoms will go away. Get medical help right away. Call your local emergency services (911 in the [place] Do not drive yourself to the hospital. Summary After the procedure, it is common to have bruising and tenderness at the site. Follow instructions from your health care provider about how to take care of your radial site wound. Check the wound every day for signs of infection. Do not lift anything that is heavier than 10 lb (4.5 kg), or the limit that you are told, until your health care provider says that it is safe. This information is not intended to replace advice given to you by your health care provider. Make sure you discuss any questions you have with your health care provider. Document Revised: 11/10/2017 Document Reviewed: 11/10/2017 Elsevier Patient Education  2020 [employer].

## 2022-10-21 ENCOUNTER — Other Ambulatory Visit (HOSPITAL_COMMUNITY): Payer: Self-pay

## 2022-10-21 ENCOUNTER — Encounter (HOSPITAL_COMMUNITY): Payer: Self-pay | Admitting: Cardiology

## 2022-10-29 ENCOUNTER — Telehealth (HOSPITAL_COMMUNITY): Payer: Self-pay

## 2022-10-29 NOTE — Telephone Encounter (Signed)
Attempted to call patient in regards to Cardiac Rehab - LM on VM 

## 2022-11-05 ENCOUNTER — Ambulatory Visit: Payer: Medicaid Other | Admitting: Cardiology

## 2022-11-05 ENCOUNTER — Encounter: Payer: Self-pay | Admitting: Cardiology

## 2022-11-05 VITALS — BP 122/76 | HR 90 | Resp 16 | Ht 62.0 in | Wt 231.0 lb

## 2022-11-05 DIAGNOSIS — E782 Mixed hyperlipidemia: Secondary | ICD-10-CM

## 2022-11-05 DIAGNOSIS — I10 Essential (primary) hypertension: Secondary | ICD-10-CM

## 2022-11-05 DIAGNOSIS — I25118 Atherosclerotic heart disease of native coronary artery with other forms of angina pectoris: Secondary | ICD-10-CM

## 2022-11-05 NOTE — Progress Notes (Signed)
Follow up visit  Subjective:   [name], female    DOB: [birth year], 57 y.o.   MRN: [id number]     HPI  Chief Complaint  Patient presents with   Coronary Artery Disease   Follow-up    cath    57 y/o African-American female with hypertension, type 2 diabetes mellitus, CAD, postmenopausal vaginal bleeding, anemia  Patient is doing well since her PCI in 10/2022, has not had any exertional chest pain. She reports episodes of sudden shortness of breath for a few seconds at rest, she attribute this to panic attach. Reviewed recent test results with the patient, details below. She has lost 30 lbs intentionally.     Current Outpatient Medications:    acetaminophen (TYLENOL) 325 MG tablet, Take 650 mg by mouth every 6 (six) hours as needed (pain.)., Disp: , Rfl:    amLODipine (NORVASC) 10 MG tablet, TAKE 1 TABLET(10 MG) BY MOUTH EVERY EVENING, Disp: 90 tablet, Rfl: 0   aspirin 81 MG chewable tablet, Chew 81 mg by mouth in the morning., Disp: , Rfl:    atorvastatin (LIPITOR) 40 MG tablet, Take 1 tablet (40 mg total) by mouth every evening., Disp: 90 tablet, Rfl: 3   empagliflozin (JARDIANCE) 10 MG TABS tablet, Take 1 tablet (10 mg total) by mouth daily., Disp: 90 tablet, Rfl: 1   Evolocumab (REPATHA SURECLICK) 841 MG/ML SOAJ, Inject 140 mg into the skin every 14 (fourteen) days., Disp: 2 mL, Rfl: 5   Ferrous Sulfate (IRON) 325 (65 FE) MG TABS, Take 325 mg by mouth 2 (two) times daily., Disp: , Rfl:    furosemide (LASIX) 40 MG tablet, Take 40 mg by mouth as needed., Disp: , Rfl:    isosorbide mononitrate (IMDUR) 30 MG 24 hr tablet, Take 1 tablet (30 mg total) by mouth daily., Disp: 90 tablet, Rfl: 1   losartan (COZAAR) 25 MG tablet, Take 1 tablet (25 mg total) by mouth daily., Disp: 90 tablet, Rfl: 1   metFORMIN (GLUCOPHAGE) 1000 MG tablet, Take 500 mg by mouth 2 (two) times daily with a meal., Disp: , Rfl:    metoprolol succinate (TOPROL-XL) 100 MG 24 hr tablet, Take 1 tablet (100  mg total) by mouth daily. Take with or immediately following a meal., Disp: 90 tablet, Rfl: 1   nitroGLYCERIN (NITROSTAT) 0.4 MG SL tablet, Place 1 tablet (0.4 mg total) under the tongue every 5 (five) minutes as needed for chest pain., Disp: 30 tablet, Rfl: 0   ranolazine (RANEXA) 1000 MG SR tablet, Take 1 tablet (1,000 mg total) by mouth 2 (two) times daily., Disp: 60 tablet, Rfl: 1   Semaglutide (OZEMPIC, 0.25 OR 0.5 MG/DOSE, [place]), Inject 0.5 mg into the skin every Sunday., Disp: , Rfl:    ticagrelor (BRILINTA) 90 MG TABS tablet, Take 1 tablet (90 mg total) by mouth 2 (two) times daily., Disp: 60 tablet, Rfl: 6   Cardiovascular & other pertient studies:  Reviewed external labs and tests, independently interpreted  EKG 11/05/2021: Sinus rhythm 90 bpm Nonspecific ST-T abnormality  Coronary intervention 10/20/2022: LM: Normal LAD: Mild mid LAD, diag 2 disease LCx: Ostial 40%, mid 70%, small OM 60% stenoses RCA: Prox 80% stenoses (serial progression). Mid overlapping stents with mid 50% ISR, RPL focal 70% stenosis   Normal LVEDP      Successful percutaneous coronary intervention ostial to mid RCA        PTCA and stent placement 3.0 X 28 mm Synergy drug-eluting stent  Postdilatation using 3.5 guide and 4.0 mm [place] balloons in serial fashion from mid to ostial RCA, up to 18 atm IVUS MSA 5 mm2, MLA 10 mm2  Recent labs: 10/15/2022: Glucose 152, BUN/Cr 16/1.25. EGFR 51. Na/K 139/3.7.  BNP 18 Trop HS 3 H/H 11/35. MCV 74. Platelets 334 HbA1C 6.1% Chol 87, TG 68, HDL 42, LDL 30  05/27/2022: Chol 143, TG 69, HDL 35, LDL 94  04/02/2022: Glucose 242, BUN/Cr 17/0.96. EGFR >60. Na/K 139/4.1.  H/H 8.2/25. MCV 76. Platelets 315 HbA1C 8.7% Lipid panel NA Lipoprotein a 196  Latest Reference Range & Units 04/02/22 15:48 04/02/22 16:08  Troponin I (High Sensitivity) <18 ng/L 11 10     Review of Systems  Cardiovascular:  Negative for chest pain, dyspnea on exertion, leg swelling,  palpitations and syncope.  Respiratory:  Positive for shortness of breath.        Vitals:   11/05/22 1130  BP: 122/76  Pulse: 90  Resp: 16  SpO2: 98%    Body mass index is 42.25 kg/m. Filed Weights   11/05/22 1130  Weight: 231 lb (104.8 kg)     Objective:   Physical Exam Vitals and nursing note reviewed.  Constitutional:      General: She is not in acute distress. Neck:     Vascular: No JVD.  Cardiovascular:     Rate and Rhythm: Normal rate and regular rhythm.     Heart sounds: Normal heart sounds. No murmur heard. Pulmonary:     Effort: Pulmonary effort is normal.     Breath sounds: Normal breath sounds. No wheezing or rales.  Musculoskeletal:     Right lower leg: No edema.     Left lower leg: No edema.             Visit diagnoses:   ICD-10-CM   1. Coronary artery disease of native artery of native heart with stable angina pectoris ([place])  I25.118 EKG 12-Lead    2. Mixed hyperlipidemia  E78.2     3. Essential hypertension  I10         Assessment & Recommendations:   57 y/o African-American female with hypertension, type 2 diabetes mellitus, CAD, postmenopausal vaginal bleeding, anemia  CAD: Anterior, apical, and lateral walls of the left ventricle, consistent with myocardial ischemia. (07/2022). While this would suggest possible LAD disease, no significant LAD disease on noted on cath (07/2022). Complex CAD with residual severe prox RCA disease, mid RCA ISR, severe OM1 disease. Discussed in heart team meeting on 07/31/2022. No targets for CABG. OM1 too small to have sustainable PCI solution. S/p ostial-prox PCI to RCA (10/2022) LDL down to 30 on Crestor and Repatha. Continue DAPT with Aspirin and Brilinta at least till 03/2023. Recent random dyspnea symptoms possibly due to Brilinta. Encourage taking it with caffeine.  Congratulated her on weight loss.  Mixed hyperlipidemia: As above  Hypertension: Well-controlled.  F/u in 6 months      Courtney Mormon, MD Pager: [phone] Office: [phone]

## 2022-11-27 ENCOUNTER — Encounter (HOSPITAL_COMMUNITY): Payer: Self-pay

## 2022-12-02 ENCOUNTER — Ambulatory Visit: Payer: Medicaid Other | Admitting: Cardiology

## 2022-12-16 DIAGNOSIS — R040 Epistaxis: Secondary | ICD-10-CM | POA: Insufficient documentation

## 2022-12-31 ENCOUNTER — Telehealth (HOSPITAL_COMMUNITY): Payer: Self-pay

## 2022-12-31 NOTE — Telephone Encounter (Signed)
Pt insurance is active and benefits verified through [name]. Co-pay $3.00, DED $0.00/$0.00 met, out of pocket $0.00/$0.00 met, co-insurance 0%. No pre-authorization required. [employer], 12/31/22 @ 3:04PM, [id number]   How many CR sessions are covered? (36 sessions for TCR, 72 sessions for ICR)36 Is this a lifetime maximum or an annual maximum? Annual Has the member used any of these services to date? No Is there a time limit (weeks/months) on start of program and/or program completion? No     Will contact patient to see if she is interested in the Cardiac Rehab Program.

## 2023-01-08 ENCOUNTER — Telehealth (HOSPITAL_COMMUNITY): Payer: Self-pay

## 2023-01-08 NOTE — Telephone Encounter (Signed)
Called patient to see if she was interested in participating in the Cardiac Rehab Program. Patient stated yes. Patient will come in for orientation on 01/13/23 @ 8AM and will attend the 6:45AM exercise class.   Tourist information centre manager.

## 2023-01-11 ENCOUNTER — Telehealth (HOSPITAL_COMMUNITY): Payer: Self-pay

## 2023-01-11 NOTE — Telephone Encounter (Signed)
Called pt to confirm appt, no answer. LM on VM.   Colbert Ewing, MS 01/11/2023 2:15 PM

## 2023-01-13 ENCOUNTER — Ambulatory Visit (HOSPITAL_COMMUNITY): Payer: Medicaid Other

## 2023-01-14 ENCOUNTER — Encounter (HOSPITAL_COMMUNITY)
Admission: RE | Admit: 2023-01-14 | Discharge: 2023-01-14 | Disposition: A | Discharge status: Home / Self Care | Payer: Medicaid Other | Source: Ambulatory Visit | Attending: Cardiology | Admitting: Cardiology

## 2023-01-14 VITALS — BP 118/62 | HR 92 | Ht 62.0 in | Wt 233.7 lb

## 2023-01-14 DIAGNOSIS — Z48812 Encounter for surgical aftercare following surgery on the circulatory system: Secondary | ICD-10-CM | POA: Diagnosis not present

## 2023-01-14 DIAGNOSIS — Z955 Presence of coronary angioplasty implant and graft: Secondary | ICD-10-CM

## 2023-01-14 LAB — GLUCOSE, CAPILLARY: Glucose-Capillary: 113 mg/dL — ABNORMAL HIGH (ref 70–99)

## 2023-01-14 NOTE — Progress Notes (Signed)
Cardiac Individual Treatment Plan  Patient Details  Name: [name] MRN: [id number] Date of Birth: [birth year] Referring Provider:   Flowsheet Row CARDIAC REHAB PHASE II ORIENTATION from 01/14/2023 in [name] [place] [facility]  Referring Provider Patwardhan, Reynold Bowen, MD       Initial Encounter Date:  [place] from 01/14/2023 in [facility]  Date 01/14/23       Visit Diagnosis: S/P right coronary artery (RCA) stent placement  Patient's Home Medications on Admission:  Current Outpatient Medications:    acetaminophen (TYLENOL) 325 MG tablet, Take 650 mg by mouth every 6 (six) hours as needed (pain.)., Disp: , Rfl:    amLODipine (NORVASC) 10 MG tablet, TAKE 1 TABLET(10 MG) BY MOUTH EVERY EVENING, Disp: 90 tablet, Rfl: 0   aspirin 81 MG chewable tablet, Chew 81 mg by mouth in the morning., Disp: , Rfl:    atorvastatin (LIPITOR) 40 MG tablet, Take 1 tablet (40 mg total) by mouth every evening., Disp: 90 tablet, Rfl: 3   empagliflozin (JARDIANCE) 10 MG TABS tablet, Take 1 tablet (10 mg total) by mouth daily., Disp: 90 tablet, Rfl: 1   Evolocumab (REPATHA SURECLICK) [id number] MG/ML SOAJ, Inject 140 mg into the skin every 14 (fourteen) days., Disp: 2 mL, Rfl: 5   Ferrous Sulfate (IRON) 325 (65 FE) MG TABS, Take 325 mg by mouth 2 (two) times daily., Disp: , Rfl:    furosemide (LASIX) 40 MG tablet, Take 40 mg by mouth as needed., Disp: , Rfl:    isosorbide mononitrate (IMDUR) 30 MG 24 hr tablet, Take 1 tablet (30 mg total) by mouth daily., Disp: 90 tablet, Rfl: 1   losartan (COZAAR) 25 MG tablet, Take 1 tablet (25 mg total) by mouth daily., Disp: 90 tablet, Rfl: 1   metFORMIN (GLUCOPHAGE) 1000 MG tablet, Take 500 mg by mouth 2 (two) times daily with a meal., Disp: , Rfl:    metoprolol succinate (TOPROL-XL) 100 MG 24 hr tablet, Take 1 tablet (100 mg  total) by mouth daily. Take with or immediately following a meal., Disp: 90 tablet, Rfl: 1   nitroGLYCERIN (NITROSTAT) 0.4 MG SL tablet, Place 1 tablet (0.4 mg total) under the tongue every 5 (five) minutes as needed for chest pain., Disp: 30 tablet, Rfl: 0   Semaglutide (OZEMPIC, 0.25 OR 0.5 MG/DOSE, [place]), Inject 0.5 mg into the skin every Sunday., Disp: , Rfl:    ticagrelor (BRILINTA) 90 MG TABS tablet, Take 1 tablet (90 mg total) by mouth 2 (two) times daily., Disp: 60 tablet, Rfl: 6   ranolazine (RANEXA) 1000 MG SR tablet, Take 1 tablet (1,000 mg total) by mouth 2 (two) times daily., Disp: 60 tablet, Rfl: 1  Past Medical History: Past Medical History:  Diagnosis Date   Anemia    CAD (coronary artery disease)    PCI with stent to RCA in 2019   CHF (congestive heart failure) ([place]) 04/2011   EF 15-20% from echo 05/19/11   DOE (dyspnea on exertion)    DVT (deep venous thrombosis) ([place]) 06/2011   LLE   Fatigue    HTN (hypertension)    Hyperlipidemia    Menorrhagia    with iron deficient anemia   NSTEMI (non-ST elevated myocardial infarction) ([place]) 10/26/2017   NSTEMI (non-ST elevated myocardial infarction) ([place]) 10/27/2017   Obesity    Orthopnea    Type II diabetes mellitus (  [place])     Tobacco Use: Social History   Tobacco Use  Smoking Status Former   Packs/day: 0.50   Years: 10.00   Additional pack years: 0.00   Total pack years: 5.00   Types: Cigarettes   Quit date: 10/19/1993   Years since quitting: 29.2  Smokeless Tobacco Never    Labs: Review Flowsheet  More data exists      Latest Ref Rng & Units 11/17/2019 03/10/2022 05/27/2022 07/27/2022 08/27/2022  Labs for ITP Cardiac and Pulmonary Rehab  Cholestrol 100 - 199 mg/dL 106  - 143  105  87   LDL (calc) 0 - 99 mg/dL 62  - 94  58  30   Direct LDL 0 - 99 mg/dL - - - 67  -  HDL-C >39 mg/dL 34  - 35  33  42   Trlycerides 0 - 149 mg/dL 51  - 69  71  68   Hemoglobin A1c 4.8 - 5.6 % - 8.7  - - -    Capillary Blood  Glucose: Lab Results  Component Value Date   GLUCAP 113 (H) 01/14/2023   GLUCAP 96 10/20/2022   GLUCAP 159 (H) 10/20/2022   GLUCAP 104 (H) 07/28/2022   GLUCAP 150 (H) 07/28/2022     Exercise Target Goals: Exercise Program Goal: Individual exercise prescription set using results from initial 6 min walk test and THRR while considering  patient's activity barriers and safety.   Exercise Prescription Goal: Initial exercise prescription builds to 30-45 minutes a day of aerobic activity, 2-3 days per week.  Home exercise guidelines will be given to patient during program as part of exercise prescription that the participant will acknowledge.  Activity Barriers & Risk Stratification:  Activity Barriers & Cardiac Risk Stratification - 01/14/23 1537       Activity Barriers & Cardiac Risk Stratification   Activity Barriers Shortness of Breath   with hills   Cardiac Risk Stratification High   <5 METs on 6MWT            6 Minute Walk:  6 Minute Walk     Row Name 01/14/23 1550         6 Minute Walk   Phase Initial     Distance 1045 feet     Walk Time 6 minutes     # of Rest Breaks 0     MPH 1.98     METS 2.55     RPE 11     Perceived Dyspnea  0     VO2 Peak 8.94     Symptoms No     Resting HR 92 bpm     Resting BP 118/62     Resting Oxygen Saturation  100 %     Exercise Oxygen Saturation  during 6 min walk 99 %     Max Ex. HR 109 bpm     Max Ex. BP 130/80     2 Minute Post BP 116/74              Oxygen Initial Assessment:   Oxygen Re-Evaluation:   Oxygen Discharge (Final Oxygen Re-Evaluation):   Initial Exercise Prescription:  Initial Exercise Prescription - 01/14/23 1500       Date of Initial Exercise RX and Referring Provider   Date 01/14/23    Referring Provider [name], MD    Expected Discharge Date 03/26/23      Treadmill   MPH 1.7    Grade 0  Minutes 15    METs 2      NuStep   Level 1    SPM 65    Minutes 15    METs 2       Prescription Details   Frequency (times per week) 3    Duration Progress to 30 minutes of continuous aerobic without signs/symptoms of physical distress      Intensity   THRR 40-80% of Max Heartrate 66-133    Ratings of Perceived Exertion 11-13    Perceived Dyspnea 0-4      Progression   Progression Continue progressive overload as per policy without signs/symptoms or physical distress.      Resistance Training   Training Prescription Yes    Weight 2    Reps 10-15             Perform Capillary Blood Glucose checks as needed.  Exercise Prescription Changes:   Exercise Comments:   Exercise Goals and Review:   Exercise Goals     Row Name 01/14/23 1538             Exercise Goals   Increase Physical Activity Yes       Intervention Provide advice, education, support and counseling about physical activity/exercise needs.;Develop an individualized exercise prescription for aerobic and resistive training based on initial evaluation findings, risk stratification, comorbidities and participant's personal goals.       Expected Outcomes Short Term: Attend rehab on a regular basis to increase amount of physical activity.;Long Term: Exercising regularly at least 3-5 days a week.;Long Term: Add in home exercise to make exercise part of routine and to increase amount of physical activity.       Increase Strength and Stamina Yes       Intervention Provide advice, education, support and counseling about physical activity/exercise needs.;Develop an individualized exercise prescription for aerobic and resistive training based on initial evaluation findings, risk stratification, comorbidities and participant's personal goals.       Expected Outcomes Short Term: Increase workloads from initial exercise prescription for resistance, speed, and METs.;Short Term: Perform resistance training exercises routinely during rehab and add in resistance training at home;Long Term: Improve  cardiorespiratory fitness, muscular endurance and strength as measured by increased METs and functional capacity (6MWT)       Able to understand and use rate of perceived exertion (RPE) scale Yes       Intervention Provide education and explanation on how to use RPE scale       Expected Outcomes Short Term: Able to use RPE daily in rehab to express subjective intensity level;Long Term:  Able to use RPE to guide intensity level when exercising independently       Able to understand and use Dyspnea scale Yes       Intervention Provide education and explanation on how to use Dyspnea scale       Expected Outcomes Short Term: Able to use Dyspnea scale daily in rehab to express subjective sense of shortness of breath during exertion;Long Term: Able to use Dyspnea scale to guide intensity level when exercising independently       Knowledge and understanding of Target Heart Rate Range (THRR) Yes       Intervention Provide education and explanation of THRR including how the numbers were predicted and where they are located for reference       Expected Outcomes Short Term: Able to state/look up THRR;Long Term: Able to use THRR to govern intensity when exercising independently;Short Term: Able  to use daily as guideline for intensity in rehab       Understanding of Exercise Prescription Yes       Intervention Provide education, explanation, and written materials on patient's individual exercise prescription       Expected Outcomes Short Term: Able to explain program exercise prescription;Long Term: Able to explain home exercise prescription to exercise independently                Exercise Goals Re-Evaluation :   Discharge Exercise Prescription (Final Exercise Prescription Changes):   Nutrition:  Target Goals: Understanding of nutrition guidelines, daily intake of sodium 1500mg , cholesterol 200mg , calories 30% from fat and 7% or less from saturated fats, daily to have 5 or more servings of fruits  and vegetables.  Biometrics:  Pre Biometrics - 01/14/23 1452       Pre Biometrics   Waist Circumference 51.5 inches    Hip Circumference 56 inches    Waist to Hip Ratio 0.92 %    Triceps Skinfold 55 mm    % Body Fat 56.3 %    Grip Strength 32 kg    Flexibility 16.5 in    Single Leg Stand 20.87 seconds              Nutrition Therapy Plan and Nutrition Goals:   Nutrition Assessments:  MEDIFICTS Score Key: ?70 Need to make dietary changes  40-70 Heart Healthy Diet ? 40 Therapeutic Level Cholesterol Diet   Flowsheet Row CARDIAC REHAB PHASE II EXERCISE from 07/22/2022 in [facility]  Picture Your Plate Total Score on Admission 71      Picture Your Plate Scores: [id number] Unhealthy dietary pattern with much room for improvement. 41-50 Dietary pattern unlikely to meet recommendations for good health and room for improvement. 51-60 More healthful dietary pattern, with some room for improvement.  >60 Healthy dietary pattern, although there may be some specific behaviors that could be improved.    Nutrition Goals Re-Evaluation:   Nutrition Goals Re-Evaluation:   Nutrition Goals Discharge (Final Nutrition Goals Re-Evaluation):   Psychosocial: Target Goals: Acknowledge presence or absence of significant depression and/or stress, maximize coping skills, provide positive support system. Participant is able to verbalize types and ability to use techniques and skills needed for reducing stress and depression.  Initial Review & Psychosocial Screening:  Initial Psych Review & Screening - 01/14/23 1539       Initial Review   Current issues with None Identified      Family Dynamics   Good Support System? Yes   Cadi has her 3 daughters and dog for support     Barriers   Psychosocial barriers to participate in program There are no identifiable barriers or psychosocial needs.      Screening Interventions    Interventions Encouraged to exercise;Provide feedback about the scores to participant    Expected Outcomes Short Term goal: Identification and review with participant of any Quality of Life or Depression concerns found by scoring the questionnaire.;Long Term goal: The participant improves quality of Life and PHQ9 Scores as seen by post scores and/or verbalization of changes             Quality of Life Scores:  Quality of Life - 01/14/23 1618       Quality of Life   Select Quality of Life      Quality of Life Scores   Health/Function Pre 29.2 %    Socioeconomic Pre 27.43 %  Psych/Spiritual Pre 30 %    Family Pre 28.8 %    GLOBAL Pre 28.91 %            Scores of 19 and below usually indicate a poorer quality of life in these areas.  A difference of  2-3 points is a clinically meaningful difference.  A difference of 2-3 points in the total score of the Quality of Life Index has been associated with significant improvement in overall quality of life, self-image, physical symptoms, and general health in studies assessing change in quality of life.  PHQ-9: Review Flowsheet       01/14/2023 07/14/2022 04/02/2022 12/22/2017  Depression screen PHQ 2/9  Decreased Interest 0 0 3 0  Down, Depressed, Hopeless 0 0 0 0  PHQ - 2 Score 0 0 3 0  Altered sleeping 0 - 3 -  Tired, decreased energy 0 - 3 -  Change in appetite 0 - 1 -  Feeling bad or failure about yourself  0 - 0 -  Trouble concentrating 0 - 0 -  Moving slowly or fidgety/restless 0 - 3 -  Suicidal thoughts 0 - 0 -  PHQ-9 Score 0 - 13 -  Difficult doing work/chores - - Very difficult -   Interpretation of Total Score  Total Score Depression Severity:  1-4 = Minimal depression, 5-9 = Mild depression, 10-14 = Moderate depression, 15-19 = Moderately severe depression, 20-27 = Severe depression   Psychosocial Evaluation and Intervention:   Psychosocial Re-Evaluation:   Psychosocial Discharge (Final Psychosocial  Re-Evaluation):   Vocational Rehabilitation: Provide vocational rehab assistance to qualifying candidates.   Vocational Rehab Evaluation & Intervention:  Vocational Rehab - 01/14/23 1540       Initial Vocational Rehab Evaluation & Intervention   Assessment shows need for Vocational Rehabilitation No   Knya is unemplyed, however not interested in looking for a job currently            Education: Education Goals: Education classes will be provided on a weekly basis, covering required topics. Participant will state understanding/return demonstration of topics presented.     Core Videos: Exercise    Move It!  Clinical staff conducted group or individual video education with verbal and written material and guidebook.  Patient learns the recommended Pritikin exercise program. Exercise with the goal of living a long, healthy life. Some of the health benefits of exercise include controlled diabetes, healthier blood pressure levels, improved cholesterol levels, improved heart and lung capacity, improved sleep, and better body composition. Everyone should speak with their doctor before starting or changing an exercise routine.  Biomechanical Limitations Clinical staff conducted group or individual video education with verbal and written material and guidebook.  Patient learns how biomechanical limitations can impact exercise and how we can mitigate and possibly overcome limitations to have an impactful and balanced exercise routine.  Body Composition Clinical staff conducted group or individual video education with verbal and written material and guidebook.  Patient learns that body composition (ratio of muscle mass to fat mass) is a key component to assessing overall fitness, rather than body weight alone. Increased fat mass, especially visceral belly fat, can put [place] at increased risk for metabolic syndrome, type 2 diabetes, heart disease, and even death. It is recommended to combine  diet and exercise (cardiovascular and resistance training) to improve your body composition. Seek guidance from your physician and exercise physiologist before implementing an exercise routine.  Exercise Action Plan Clinical staff conducted group or individual video education  with verbal and written material and guidebook.  Patient learns the recommended strategies to achieve and enjoy long-term exercise adherence, including variety, self-motivation, self-efficacy, and positive decision making. Benefits of exercise include fitness, good health, weight management, more energy, better sleep, less stress, and overall well-being.  Medical   Heart Disease Risk Reduction Clinical staff conducted group or individual video education with verbal and written material and guidebook.  Patient learns our heart is our most vital organ as it circulates oxygen, nutrients, white blood cells, and hormones throughout the entire body, and carries waste away. Data supports a plant-based eating plan like the Pritikin Program for its effectiveness in slowing progression of and reversing heart disease. The video provides a number of recommendations to address heart disease.   Metabolic Syndrome and Belly Fat  Clinical staff conducted group or individual video education with verbal and written material and guidebook.  Patient learns what metabolic syndrome is, how it leads to heart disease, and how one can reverse it and keep it from coming back. You have metabolic syndrome if you have 3 of the following 5 criteria: abdominal obesity, high blood pressure, high triglycerides, low HDL cholesterol, and high blood sugar.  Hypertension and Heart Disease Clinical staff conducted group or individual video education with verbal and written material and guidebook.  Patient learns that high blood pressure, or hypertension, is very common in the [place]. Hypertension is largely due to excessive salt intake, but other important  risk factors include being overweight, physical inactivity, drinking too much alcohol, smoking, and not eating enough potassium from fruits and vegetables. High blood pressure is a leading risk factor for heart attack, stroke, congestive heart failure, dementia, kidney failure, and premature death. Long-term effects of excessive salt intake include stiffening of the arteries and thickening of heart muscle and organ damage. Recommendations include ways to reduce hypertension and the risk of heart disease.  Diseases of Our Time - Focusing on Diabetes Clinical staff conducted group or individual video education with verbal and written material and guidebook.  Patient learns why the best way to stop diseases of our time is prevention, through food and other lifestyle changes. Medicine (such as prescription pills and surgeries) is often only a Band-Aid on the problem, not a long-term solution. Most common diseases of our time include obesity, type 2 diabetes, hypertension, heart disease, and cancer. The Pritikin Program is recommended and has been proven to help reduce, reverse, and/or prevent the damaging effects of metabolic syndrome.  Nutrition   Overview of the Pritikin Eating Plan  Clinical staff conducted group or individual video education with verbal and written material and guidebook.  Patient learns about the [place] for disease risk reduction. The [place] emphasizes a wide variety of unrefined, minimally-processed carbohydrates, like fruits, vegetables, whole grains, and legumes. Go, Caution, and Stop food choices are explained. Plant-based and lean animal proteins are emphasized. Rationale provided for low sodium intake for blood pressure control, low added sugars for blood sugar stabilization, and low added fats and oils for coronary artery disease risk reduction and weight management.  Calorie Density  Clinical staff conducted group or individual video education with  verbal and written material and guidebook.  Patient learns about calorie density and how it impacts the Pritikin Eating Plan. Knowing the characteristics of the food you choose will help you decide whether those foods will lead to weight gain or weight loss, and whether you want to consume more or less of them. Weight loss  is usually a side effect of the [place] because of its focus on low calorie-dense foods.  Label Reading  Clinical staff conducted group or individual video education with verbal and written material and guidebook.  Patient learns about the Pritikin recommended label reading guidelines and corresponding recommendations regarding calorie density, added sugars, sodium content, and whole grains.  Dining Out - Part 1  Clinical staff conducted group or individual video education with verbal and written material and guidebook.  Patient learns that restaurant meals can be sabotaging because they can be so high in calories, fat, sodium, and/or sugar. Patient learns recommended strategies on how to positively address this and avoid unhealthy pitfalls.  Facts on Fats  Clinical staff conducted group or individual video education with verbal and written material and guidebook.  Patient learns that lifestyle modifications can be just as effective, if not more so, as many medications for lowering your risk of heart disease. A Pritikin lifestyle can help to reduce your risk of inflammation and atherosclerosis (cholesterol build-up, or plaque, in the artery walls). Lifestyle interventions such as dietary choices and physical activity address the cause of atherosclerosis. A review of the types of fats and their impact on blood cholesterol levels, along with dietary recommendations to reduce fat intake is also included.  Nutrition Action Plan  Clinical staff conducted group or individual video education with verbal and written material and guidebook.  Patient learns how to incorporate  Pritikin recommendations into their lifestyle. Recommendations include planning and keeping personal health goals in mind as an important part of their success.  Healthy Mind-Set    Healthy Minds, Bodies, Hearts  Clinical staff conducted group or individual video education with verbal and written material and guidebook.  Patient learns how to identify when they are stressed. Video will discuss the impact of that stress, as well as the many benefits of stress management. Patient will also be introduced to stress management techniques. The way we think, act, and feel has an impact on our hearts.  How Our Thoughts Can Heal Our Hearts  Clinical staff conducted group or individual video education with verbal and written material and guidebook.  Patient learns that negative thoughts can cause depression and anxiety. This can result in negative lifestyle behavior and serious health problems. Cognitive behavioral therapy is an effective method to help control our thoughts in order to change and improve our emotional outlook.  Additional Videos:  Exercise    Improving Performance  Clinical staff conducted group or individual video education with verbal and written material and guidebook.  Patient learns to use a non-linear approach by alternating intensity levels and lengths of time spent exercising to help burn more calories and lose more body fat. Cardiovascular exercise helps improve heart health, metabolism, hormonal balance, blood sugar control, and recovery from fatigue. Resistance training improves strength, endurance, balance, coordination, reaction time, metabolism, and muscle mass. Flexibility exercise improves circulation, posture, and balance. Seek guidance from your physician and exercise physiologist before implementing an exercise routine and learn your capabilities and proper form for all exercise.  Introduction to Yoga  Clinical staff conducted group or individual video education with  verbal and written material and guidebook.  Patient learns about yoga, a discipline of the coming together of mind, breath, and body. The benefits of yoga include improved flexibility, improved range of motion, better posture and core strength, increased lung function, weight loss, and positive self-image. Yoga's heart health benefits include lowered blood pressure, healthier heart rate, decreased cholesterol  and triglyceride levels, improved immune function, and reduced stress. Seek guidance from your physician and exercise physiologist before implementing an exercise routine and learn your capabilities and proper form for all exercise.  Medical   Aging: Enhancing Your Quality of Life  Clinical staff conducted group or individual video education with verbal and written material and guidebook.  Patient learns key strategies and recommendations to stay in good physical health and enhance quality of life, such as prevention strategies, having an advocate, securing a [place], and keeping a list of medications and system for tracking them. It also discusses how to avoid risk for bone loss.  Biology of Weight Control  Clinical staff conducted group or individual video education with verbal and written material and guidebook.  Patient learns that weight gain occurs because we consume more calories than we burn (eating more, moving less). Even if your body weight is normal, you may have higher ratios of fat compared to muscle mass. Too much body fat puts you at increased risk for cardiovascular disease, heart attack, stroke, type 2 diabetes, and obesity-related cancers. In addition to exercise, following the [place] can help reduce your risk.  Decoding Lab Results  Clinical staff conducted group or individual video education with verbal and written material and guidebook.  Patient learns that lab test reflects one measurement whose values change over time and are  influenced by many factors, including medication, stress, sleep, exercise, food, hydration, pre-existing medical conditions, and more. It is recommended to use the knowledge from this video to become more involved with your lab results and evaluate your numbers to speak with your doctor.   Diseases of Our Time - Overview  Clinical staff conducted group or individual video education with verbal and written material and guidebook.  Patient learns that according to the CDC, 50% to 70% of chronic diseases (such as obesity, type 2 diabetes, elevated lipids, hypertension, and heart disease) are avoidable through lifestyle improvements including healthier food choices, listening to satiety cues, and increased physical activity.  Sleep Disorders Clinical staff conducted group or individual video education with verbal and written material and guidebook.  Patient learns how good quality and duration of sleep are important to overall health and well-being. Patient also learns about sleep disorders and how they impact health along with recommendations to address them, including discussing with a physician.  Nutrition  Dining Out - Part 2 Clinical staff conducted group or individual video education with verbal and written material and guidebook.  Patient learns how to plan ahead and communicate in order to maximize their dining experience in a healthy and nutritious manner. Included are recommended food choices based on the type of restaurant the patient is visiting.   Fueling a Best boy conducted group or individual video education with verbal and written material and guidebook.  There is a strong connection between our food choices and our health. Diseases like obesity and type 2 diabetes are very prevalent and are in large-part due to lifestyle choices. The Pritikin Eating Plan provides plenty of food and hunger-curbing satisfaction. It is easy to follow, affordable, and helps reduce  health risks.  Menu Workshop  Clinical staff conducted group or individual video education with verbal and written material and guidebook.  Patient learns that restaurant meals can sabotage health goals because they are often packed with calories, fat, sodium, and sugar. Recommendations include strategies to plan ahead and to communicate with the manager, chef, or  server to help order a healthier meal.  Planning Your Eating Strategy  Clinical staff conducted group or individual video education with verbal and written material and guidebook.  Patient learns about the [place] and its benefit of reducing the risk of disease. The [place] does not focus on calories. Instead, it emphasizes high-quality, nutrient-rich foods. By knowing the characteristics of the foods, we choose, we can determine their calorie density and make informed decisions.  Targeting Your Nutrition Priorities  Clinical staff conducted group or individual video education with verbal and written material and guidebook.  Patient learns that lifestyle habits have a tremendous impact on disease risk and progression. This video provides eating and physical activity recommendations based on your personal health goals, such as reducing LDL cholesterol, losing weight, preventing or controlling type 2 diabetes, and reducing high blood pressure.  Vitamins and Minerals  Clinical staff conducted group or individual video education with verbal and written material and guidebook.  Patient learns different ways to obtain key vitamins and minerals, including through a recommended healthy diet. It is important to discuss all supplements you take with your doctor.   Healthy Mind-Set    Smoking Cessation  Clinical staff conducted group or individual video education with verbal and written material and guidebook.  Patient learns that cigarette smoking and tobacco addiction pose a serious health risk which affects millions  of people. Stopping smoking will significantly reduce the risk of heart disease, lung disease, and many forms of cancer. Recommended strategies for quitting are covered, including working with your doctor to develop a successful plan.  Culinary   Becoming a Financial trader conducted group or individual video education with verbal and written material and guidebook.  Patient learns that cooking at home can be healthy, cost-effective, quick, and puts them in control. Keys to cooking healthy recipes will include looking at your recipe, assessing your equipment needs, planning ahead, making it simple, choosing cost-effective seasonal ingredients, and limiting the use of added fats, salts, and sugars.  Cooking - Breakfast and Snacks  Clinical staff conducted group or individual video education with verbal and written material and guidebook.  Patient learns how important breakfast is to satiety and nutrition through the entire day. Recommendations include key foods to eat during breakfast to help stabilize blood sugar levels and to prevent overeating at meals later in the day. Planning ahead is also a key component.  Cooking - Human resources officer conducted group or individual video education with verbal and written material and guidebook.  Patient learns eating strategies to improve overall health, including an approach to cook more at home. Recommendations include thinking of animal protein as a side on your plate rather than center stage and focusing instead on lower calorie dense options like vegetables, fruits, whole grains, and plant-based proteins, such as beans. Making sauces in large quantities to freeze for later and leaving the skin on your vegetables are also recommended to maximize your experience.  Cooking - Healthy Salads and Dressing Clinical staff conducted group or individual video education with verbal and written material and guidebook.  Patient learns that  vegetables, fruits, whole grains, and legumes are the foundations of the [place]. Recommendations include how to incorporate each of these in flavorful and healthy salads, and how to create homemade salad dressings. Proper handling of ingredients is also covered. Cooking - Soups and Desserts  Cooking - Soups and Desserts Clinical staff conducted group or individual video  education with verbal and written material and guidebook.  Patient learns that Pritikin soups and desserts make for easy, nutritious, and delicious snacks and meal components that are low in sodium, fat, sugar, and calorie density, while high in vitamins, minerals, and filling fiber. Recommendations include simple and healthy ideas for soups and desserts.   Overview     The Pritikin Solution Program Overview Clinical staff conducted group or individual video education with verbal and written material and guidebook.  Patient learns that the results of the [place] Program have been documented in more than 100 articles published in peer-reviewed journals, and the benefits include reducing risk factors for (and, in some cases, even reversing) high cholesterol, high blood pressure, type 2 diabetes, obesity, and more! An overview of the three key pillars of the Pritikin Program will be covered: eating well, doing regular exercise, and having a healthy mind-set.  WORKSHOPS  Exercise: Exercise Basics: Building Your Action Plan Clinical staff led group instruction and group discussion with PowerPoint presentation and patient guidebook. To enhance the learning environment the use of posters, models and videos may be added. At the conclusion of this workshop, patients will comprehend the difference between physical activity and exercise, as well as the benefits of incorporating both, into their routine. Patients will understand the FITT (Frequency, Intensity, Time, and Type) principle and how to use it to build an exercise action  plan. In addition, safety concerns and other considerations for exercise and cardiac rehab will be addressed by the presenter. The purpose of this lesson is to promote a comprehensive and effective weekly exercise routine in order to improve patients' overall level of fitness.   Managing Heart Disease: Your Path to a Healthier Heart Clinical staff led group instruction and group discussion with PowerPoint presentation and patient guidebook. To enhance the learning environment the use of posters, models and videos may be added.At the conclusion of this workshop, patients will understand the anatomy and physiology of the heart. Additionally, they will understand how Pritikin's three pillars impact the risk factors, the progression, and the management of heart disease.  The purpose of this lesson is to provide a high-level overview of the heart, heart disease, and how the Pritikin lifestyle positively impacts risk factors.  Exercise Biomechanics Clinical staff led group instruction and group discussion with PowerPoint presentation and patient guidebook. To enhance the learning environment the use of posters, models and videos may be added. Patients will learn how the structural parts of their bodies function and how these functions impact their daily activities, movement, and exercise. Patients will learn how to promote a neutral spine, learn how to manage pain, and identify ways to improve their physical movement in order to promote healthy living. The purpose of this lesson is to expose patients to common physical limitations that impact physical activity. Participants will learn practical ways to adapt and manage aches and pains, and to minimize their effect on regular exercise. Patients will learn how to maintain good posture while sitting, walking, and lifting.  Balance Training and Fall Prevention  Clinical staff led group instruction and group discussion with PowerPoint presentation and  patient guidebook. To enhance the learning environment the use of posters, models and videos may be added. At the conclusion of this workshop, patients will understand the importance of their sensorimotor skills (vision, proprioception, and the vestibular system) in maintaining their ability to balance as they age. Patients will apply a variety of balancing exercises that are appropriate for their current level of function.  Patients will understand the common causes for poor balance, possible solutions to these problems, and ways to modify their physical environment in order to minimize their fall risk. The purpose of this lesson is to teach patients about the importance of maintaining balance as they age and ways to minimize their risk of falling.  WORKSHOPS   Nutrition:  Fueling a Scientist, research (physical sciences) led group instruction and group discussion with PowerPoint presentation and patient guidebook. To enhance the learning environment the use of posters, models and videos may be added. Patients will review the foundational principles of the [place] and understand what constitutes a serving size in each of the food groups. Patients will also learn Pritikin-friendly foods that are better choices when away from home and review make-ahead meal and snack options. Calorie density will be reviewed and applied to three nutrition priorities: weight maintenance, weight loss, and weight gain. The purpose of this lesson is to reinforce (in a group setting) the key concepts around what patients are recommended to eat and how to apply these guidelines when away from home by planning and selecting Pritikin-friendly options. Patients will understand how calorie density may be adjusted for different weight management goals.  Mindful Eating  Clinical staff led group instruction and group discussion with PowerPoint presentation and patient guidebook. To enhance the learning environment the use of posters,  models and videos may be added. Patients will briefly review the concepts of the [place] and the importance of low-calorie dense foods. The concept of mindful eating will be introduced as well as the importance of paying attention to internal hunger signals. Triggers for non-hunger eating and techniques for dealing with triggers will be explored. The purpose of this lesson is to provide patients with the opportunity to review the basic principles of the [place], discuss the value of eating mindfully and how to measure internal cues of hunger and fullness using the Hunger Scale. Patients will also discuss reasons for non-hunger eating and learn strategies to use for controlling emotional eating.  Targeting Your Nutrition Priorities Clinical staff led group instruction and group discussion with PowerPoint presentation and patient guidebook. To enhance the learning environment the use of posters, models and videos may be added. Patients will learn how to determine their genetic susceptibility to disease by reviewing their family history. Patients will gain insight into the importance of diet as part of an overall healthy lifestyle in mitigating the impact of genetics and other environmental insults. The purpose of this lesson is to provide patients with the opportunity to assess their personal nutrition priorities by looking at their family history, their own health history and current risk factors. Patients will also be able to discuss ways of prioritizing and modifying the [place] for their highest risk areas  Menu  Clinical staff led group instruction and group discussion with PowerPoint presentation and patient guidebook. To enhance the learning environment the use of posters, models and videos may be added. Using menus brought in from [employer], or printed from [employer], patients will apply the [place] dining out guidelines that were presented in the  [employer] video. Patients will also be able to practice these guidelines in a variety of provided scenarios. The purpose of this lesson is to provide patients with the opportunity to practice hands-on learning of the [place] with actual menus and practice scenarios.  Label Reading Clinical staff led group instruction and group discussion with PowerPoint  presentation and patient guidebook. To enhance the learning environment the use of posters, models and videos may be added. Patients will review and discuss the Pritikin label reading guidelines presented in Pritikin's Label Reading Educational series video. Using fool labels brought in from local grocery stores and markets, patients will apply the label reading guidelines and determine if the packaged food meet the Pritikin guidelines. The purpose of this lesson is to provide patients with the opportunity to review, discuss, and practice hands-on learning of the Pritikin Label Reading guidelines with actual packaged food labels. [place] Workshops are designed to teach patients ways to prepare quick, simple, and affordable recipes at home. The importance of nutrition's role in chronic disease risk reduction is reflected in its emphasis in the overall Pritikin program. By learning how to prepare essential core Pritikin Eating Plan recipes, patients will increase control over what they eat; be able to customize the flavor of foods without the use of added salt, sugar, or fat; and improve the quality of the food they consume. By learning a set of core recipes which are easily assembled, quickly prepared, and affordable, patients are more likely to prepare more healthy foods at home. These workshops focus on convenient breakfasts, simple entres, side dishes, and desserts which can be prepared with minimal effort and are consistent with nutrition recommendations for cardiovascular risk  reduction. Cooking [employer] are taught by a Engineer, materials (RD) who has been trained by the [employer]. The chef or RD has a clear understanding of the importance of minimizing - if not completely eliminating - added fat, sugar, and sodium in recipes. Throughout the series of [place] Workshop sessions, patients will learn about healthy ingredients and efficient methods of cooking to build confidence in their capability to prepare    Cooking School weekly topics:  Adding Flavor- Sodium-Free  Fast and Healthy Breakfasts  Powerhouse Plant-Based Proteins  Satisfying Salads and Dressings  Simple Sides and Sauces  International Cuisine-Spotlight on the [place] Zones  Delicious Desserts  Savory Soups  [employer] - Meals in a Agricultural consultant Appetizers and Snacks  Comforting Weekend Breakfasts  One-Pot Wonders   Fast Evening Meals  Contractor Your Pritikin Plate  WORKSHOPS   Healthy Mindset (Psychosocial):  Focused Goals, Sustainable Changes Clinical staff led group instruction and group discussion with PowerPoint presentation and patient guidebook. To enhance the learning environment the use of posters, models and videos may be added. Patients will be able to apply effective goal setting strategies to establish at least one personal goal, and then take consistent, meaningful action toward that goal. They will learn to identify common barriers to achieving personal goals and develop strategies to overcome them. Patients will also gain an understanding of how our mind-set can impact our ability to achieve goals and the importance of cultivating a positive and growth-oriented mind-set. The purpose of this lesson is to provide patients with a deeper understanding of how to set and achieve personal goals, as well as the tools and strategies needed to overcome common obstacles which may arise along the way.  From Head to Heart: The  Power of a Healthy Outlook  Clinical staff led group instruction and group discussion with PowerPoint presentation and patient guidebook. To enhance the learning environment the use of posters, models and videos may be added. Patients will be able to recognize and describe the impact of emotions and mood on physical health. They will  discover the importance of self-care and explore self-care practices which may work for them. Patients will also learn how to utilize the 4 C's to cultivate a healthier outlook and better manage stress and challenges. The purpose of this lesson is to demonstrate to patients how a healthy outlook is an essential part of maintaining good health, especially as they continue their cardiac rehab journey.  Healthy Sleep for a Healthy Heart Clinical staff led group instruction and group discussion with PowerPoint presentation and patient guidebook. To enhance the learning environment the use of posters, models and videos may be added. At the conclusion of this workshop, patients will be able to demonstrate knowledge of the importance of sleep to overall health, well-being, and quality of life. They will understand the symptoms of, and treatments for, common sleep disorders. Patients will also be able to identify daytime and nighttime behaviors which impact sleep, and they will be able to apply these tools to help manage sleep-related challenges. The purpose of this lesson is to provide patients with a general overview of sleep and outline the importance of quality sleep. Patients will learn about a few of the most common sleep disorders. Patients will also be introduced to the concept of "sleep hygiene," and discover ways to self-manage certain sleeping problems through simple daily behavior changes. Finally, the workshop will motivate patients by clarifying the links between quality sleep and their goals of heart-healthy living.   Recognizing and Reducing Stress Clinical staff led  group instruction and group discussion with PowerPoint presentation and patient guidebook. To enhance the learning environment the use of posters, models and videos may be added. At the conclusion of this workshop, patients will be able to understand the types of stress reactions, differentiate between acute and chronic stress, and recognize the impact that chronic stress has on their health. They will also be able to apply different coping mechanisms, such as reframing negative self-talk. Patients will have the opportunity to practice a variety of stress management techniques, such as deep abdominal breathing, progressive muscle relaxation, and/or guided imagery.  The purpose of this lesson is to educate patients on the role of stress in their lives and to provide healthy techniques for coping with it.  Learning Barriers/Preferences:  Learning Barriers/Preferences - 01/14/23 1540       Learning Barriers/Preferences   Learning Barriers Sight   wears glasses   Learning Preferences Computer/Internet;Group Instruction;Individual Instruction;Pictoral;Skilled Demonstration;Verbal Instruction;Video;Written Material             Education Topics:  Knowledge Questionnaire Score:  Knowledge Questionnaire Score - 01/14/23 1546       Knowledge Questionnaire Score   Pre Score 19/24             Core Components/Risk Factors/Patient Goals at Admission:  Personal Goals and Risk Factors at Admission - 01/14/23 1541       Core Components/Risk Factors/Patient Goals on Admission    Weight Management Yes;Obesity;Weight Loss    Intervention Weight Management: Provide education and appropriate resources to help participant work on and attain dietary goals.;Obesity: Provide education and appropriate resources to help participant work on and attain dietary goals.;Weight Management: Develop a combined nutrition and exercise program designed to reach desired caloric intake, while maintaining appropriate  intake of nutrient and fiber, sodium and fats, and appropriate energy expenditure required for the weight goal.;Weight Management/Obesity: Establish reasonable short term and long term weight goals.    Admit Weight 233 lb 11 oz (106 kg)    Goal Weight: Long Term  160 lb (72.6 kg)   pt personal goal   Expected Outcomes Short Term: Continue to assess and modify interventions until short term weight is achieved;Long Term: Adherence to nutrition and physical activity/exercise program aimed toward attainment of established weight goal;Weight Loss: Understanding of general recommendations for a balanced deficit meal plan, which promotes 1-2 lb weight loss per week and includes a negative energy balance of [phone] kcal/d;Understanding recommendations for meals to include 15-35% energy as protein, 25-35% energy from fat, 35-60% energy from carbohydrates, less than 200mg  of dietary cholesterol, 20-35 gm of total fiber daily;Understanding of distribution of calorie intake throughout the day with the consumption of 4-5 meals/snacks    Intervention Provide education about signs/symptoms and action to take for hypo/hyperglycemia.;Provide education about proper nutrition, including hydration, and aerobic/resistive exercise prescription along with prescribed medications to achieve blood glucose in normal ranges: Fasting glucose 65-99 mg/dL    Expected Outcomes Short Term: Participant verbalizes understanding of the signs/symptoms and immediate care of hyper/hypoglycemia, proper foot care and importance of medication, aerobic/resistive exercise and nutrition plan for blood glucose control.;Long Term: Attainment of HbA1C < 7%.    Hypertension Yes    Intervention Provide education on lifestyle modifcations including regular physical activity/exercise, weight management, moderate sodium restriction and increased consumption of fresh fruit, vegetables, and low fat dairy, alcohol moderation, and smoking cessation.;Monitor  prescription use compliance.    Expected Outcomes Short Term: Continued assessment and intervention until BP is < 140/45mm HG in hypertensive participants. < 130/52mm HG in hypertensive participants with diabetes, heart failure or chronic kidney disease.;Long Term: Maintenance of blood pressure at goal levels.    Lipids Yes    Intervention Provide education and support for participant on nutrition & aerobic/resistive exercise along with prescribed medications to achieve LDL 70mg , HDL >40mg .    Expected Outcomes Short Term: Participant states understanding of desired cholesterol values and is compliant with medications prescribed. Participant is following exercise prescription and nutrition guidelines.;Long Term: Cholesterol controlled with medications as prescribed, with individualized exercise RX and with personalized nutrition plan. Value goals: LDL < 70mg , HDL > 40 mg.             Core Components/Risk Factors/Patient Goals Review:    Core Components/Risk Factors/Patient Goals at Discharge (Final Review):    ITP Comments:  ITP Comments     Row Name 01/14/23 1449           ITP Comments Dr. Fransico Him medical director. Introduction to [facility] education/ intensive cardiac rehab. Initial orientation packet reveiwed with patient.                Comments: Participant attended orientation for the cardiac rehabilitation program on  01/14/2023  to perform initial intake and exercise walk test. Patient introduced to the [place] education and orientation packet was reviewed. Completed 6-minute walk test, measurements, initial ITP, and exercise prescription. Vital signs stable. Telemetry-normal sinus rhythm, asymptomatic.   Service time was from 1400 to 1600.  Colbert Ewing, MS 01/14/2023 4:26 PM

## 2023-01-14 NOTE — Progress Notes (Signed)
Cardiac Rehab Medication Review   Does the patient  feel that his/her medications are working for him/her?  YES  Has the patient been experiencing any side effects to the medications prescribed?  YES   Does the patient measure his/her own blood pressure or blood glucose at home?  YES    Does the patient have any problems obtaining medications due to transportation or finances? NO  Understanding of regimen: excellent Understanding of indications: excellent Potential of compliance: excellent    Comments: Simranjit has a good understanding of her medications and her regime. She checks her CBGs in the morning before breakfast and does not have a BP cuff since her old one broke. She has noticed some constipation since starting ozempic, stating she has gone up to 3 days without a BM. Pt encouraged to reach out to her doctor about this issue to find a solution.     Colbert Ewing, MS 01/14/2023 4:18 PM

## 2023-01-18 ENCOUNTER — Encounter (HOSPITAL_COMMUNITY)
Admission: RE | Admit: 2023-01-18 | Discharge: 2023-01-18 | Disposition: A | Discharge status: Home / Self Care | Payer: Medicaid Other | Source: Ambulatory Visit | Attending: Cardiology | Admitting: Cardiology

## 2023-01-18 DIAGNOSIS — Z9861 Coronary angioplasty status: Secondary | ICD-10-CM | POA: Insufficient documentation

## 2023-01-18 DIAGNOSIS — Z955 Presence of coronary angioplasty implant and graft: Secondary | ICD-10-CM | POA: Diagnosis present

## 2023-01-18 LAB — GLUCOSE, CAPILLARY
Glucose-Capillary: 155 mg/dL — ABNORMAL HIGH (ref 70–99)
Glucose-Capillary: 131 mg/dL — ABNORMAL HIGH (ref 70–99)

## 2023-01-18 NOTE — Progress Notes (Signed)
Pt in today for group cardiac rehab today.  Pt tolerated light exercise without difficulty. VSS, resting telemetry-Sinus Rhythm.  Medication list reconciled with reporting no changes from last session . Pt denies barriers to medication compliance.  PSYCHOSOCIAL ASSESSMENT:  PHQ9-score = 0. Pt exhibits positive coping skills, hopeful outlook with supportive family. No psychosocial needs identified at this time, no psychosocial interventions necessary.  Pt oriented to exercise equipment and gym routine. Understanding verbalized.

## 2023-01-20 ENCOUNTER — Encounter (HOSPITAL_COMMUNITY): Payer: Medicaid Other

## 2023-01-22 ENCOUNTER — Encounter (HOSPITAL_COMMUNITY)
Admission: RE | Admit: 2023-01-22 | Discharge: 2023-01-22 | Disposition: A | Discharge status: Home / Self Care | Payer: Medicaid Other | Source: Ambulatory Visit | Attending: Cardiology | Admitting: Cardiology

## 2023-01-22 ENCOUNTER — Encounter (HOSPITAL_COMMUNITY): Admission: RE | Admit: 2023-01-22 | Payer: Medicaid Other | Source: Ambulatory Visit

## 2023-01-22 ENCOUNTER — Encounter (HOSPITAL_COMMUNITY): Payer: Medicaid Other

## 2023-01-22 DIAGNOSIS — Z9861 Coronary angioplasty status: Secondary | ICD-10-CM

## 2023-01-22 DIAGNOSIS — Z955 Presence of coronary angioplasty implant and graft: Secondary | ICD-10-CM

## 2023-01-22 LAB — GLUCOSE, CAPILLARY
Glucose-Capillary: 168 mg/dL — ABNORMAL HIGH (ref 70–99)
Glucose-Capillary: 172 mg/dL — ABNORMAL HIGH (ref 70–99)

## 2023-01-25 ENCOUNTER — Encounter (HOSPITAL_COMMUNITY): Payer: Medicaid Other

## 2023-01-27 ENCOUNTER — Encounter (HOSPITAL_COMMUNITY): Payer: Medicaid Other

## 2023-01-29 ENCOUNTER — Encounter (HOSPITAL_COMMUNITY)
Admission: RE | Admit: 2023-01-29 | Discharge: 2023-01-29 | Disposition: A | Discharge status: Home / Self Care | Payer: Medicaid Other | Source: Ambulatory Visit | Attending: Cardiology | Admitting: Cardiology

## 2023-01-29 DIAGNOSIS — Z9861 Coronary angioplasty status: Secondary | ICD-10-CM

## 2023-01-29 DIAGNOSIS — Z955 Presence of coronary angioplasty implant and graft: Secondary | ICD-10-CM | POA: Diagnosis not present

## 2023-02-01 ENCOUNTER — Encounter (HOSPITAL_COMMUNITY)
Admission: RE | Admit: 2023-02-01 | Discharge: 2023-02-01 | Disposition: A | Discharge status: Home / Self Care | Payer: Medicaid Other | Source: Ambulatory Visit | Attending: Cardiology | Admitting: Cardiology

## 2023-02-01 DIAGNOSIS — Z955 Presence of coronary angioplasty implant and graft: Secondary | ICD-10-CM

## 2023-02-01 LAB — GLUCOSE, CAPILLARY: Glucose-Capillary: 150 mg/dL — ABNORMAL HIGH (ref 70–99)

## 2023-02-01 NOTE — Progress Notes (Signed)
Cardiac Individual Treatment Plan  Patient Details  Name: [name] MRN: [id number] Date of Birth: 23-Jan-1966 Referring Provider:   Flowsheet Row CARDIAC REHAB PHASE II ORIENTATION from 01/14/2023 in [facility]  Referring Provider Patwardhan, Anabel Bene, MD       Initial Encounter Date:  Flowsheet Row CARDIAC REHAB PHASE II ORIENTATION from 01/14/2023 in [facility]  Date 01/14/23       Visit Diagnosis: S/P right coronary artery (RCA) stent placement  Patient's Home Medications on Admission:  Current Outpatient Medications:    acetaminophen (TYLENOL) 325 MG tablet, Take 650 mg by mouth every 6 (six) hours as needed (pain.)., Disp: , Rfl:    amLODipine (NORVASC) 10 MG tablet, TAKE 1 TABLET(10 MG) BY MOUTH EVERY EVENING, Disp: 90 tablet, Rfl: 0   aspirin 81 MG chewable tablet, Chew 81 mg by mouth in the morning., Disp: , Rfl:    atorvastatin (LIPITOR) 40 MG tablet, Take 1 tablet (40 mg total) by mouth every evening., Disp: 90 tablet, Rfl: 3   empagliflozin (JARDIANCE) 10 MG TABS tablet, Take 1 tablet (10 mg total) by mouth daily., Disp: 90 tablet, Rfl: 1   Evolocumab (REPATHA SURECLICK) 140 MG/ML SOAJ, Inject 140 mg into the skin every 14 (fourteen) days., Disp: 2 mL, Rfl: 5   Ferrous Sulfate (IRON) 325 (65 FE) MG TABS, Take 325 mg by mouth 2 (two) times daily., Disp: , Rfl:    furosemide (LASIX) 40 MG tablet, Take 40 mg by mouth as needed., Disp: , Rfl:    isosorbide mononitrate (IMDUR) 30 MG 24 hr tablet, Take 1 tablet (30 mg total) by mouth daily., Disp: 90 tablet, Rfl: 1   losartan (COZAAR) 25 MG tablet, Take 1 tablet (25 mg total) by mouth daily., Disp: 90 tablet, Rfl: 1   metFORMIN (GLUCOPHAGE) 1000 MG tablet, Take 500 mg by mouth 2 (two) times daily with a meal., Disp: , Rfl:    metoprolol succinate (TOPROL-XL) 100 MG 24 hr tablet, Take 1 tablet (100 mg  total) by mouth daily. Take with or immediately following a meal., Disp: 90 tablet, Rfl: 1   nitroGLYCERIN (NITROSTAT) 0.4 MG SL tablet, Place 1 tablet (0.4 mg total) under the tongue every 5 (five) minutes as needed for chest pain., Disp: 30 tablet, Rfl: 0   ranolazine (RANEXA) 1000 MG SR tablet, Take 1 tablet (1,000 mg total) by mouth 2 (two) times daily., Disp: 60 tablet, Rfl: 1   Semaglutide (OZEMPIC, 0.25 OR 0.5 MG/DOSE, [place]), Inject 0.5 mg into the skin every Sunday., Disp: , Rfl:    ticagrelor (BRILINTA) 90 MG TABS tablet, Take 1 tablet (90 mg total) by mouth 2 (two) times daily., Disp: 60 tablet, Rfl: 6  Past Medical History: Past Medical History:  Diagnosis Date   Anemia    CAD (coronary artery disease)    PCI with stent to RCA in 2019   CHF (congestive heart failure) (HCC) 04/2011   EF 15-20% from echo 05/19/11   DOE (dyspnea on exertion)    DVT (deep venous thrombosis) (HCC) 06/2011   LLE   Fatigue    HTN (hypertension)    Hyperlipidemia    Menorrhagia    with iron deficient anemia   NSTEMI (non-ST elevated myocardial infarction) (HCC) 10/26/2017   NSTEMI (non-ST elevated myocardial infarction) (HCC) 10/27/2017   Obesity    Orthopnea    Type II diabetes mellitus (  HCC)     Tobacco Use: Social History   Tobacco Use  Smoking Status Former   Packs/day: 0.50   Years: 10.00   Additional pack years: 0.00   Total pack years: 5.00   Types: Cigarettes   Quit date: 10/19/1993   Years since quitting: 29.3  Smokeless Tobacco Never    Labs: Review Flowsheet  More data exists      Latest Ref Rng & Units 11/17/2019 03/10/2022 05/27/2022 07/27/2022 08/27/2022  Labs for ITP Cardiac and Pulmonary Rehab  Cholestrol 100 - 199 mg/dL 161  - 096  045  87   LDL (calc) 0 - 99 mg/dL 62  - 94  58  30   Direct LDL 0 - 99 mg/dL - - - 67  -  HDL-C >40 mg/dL 34  - 35  33  42   Trlycerides 0 - 149 mg/dL 51  - 69  71  68   Hemoglobin A1c 4.8 - 5.6 % - 8.7  - - -    Capillary Blood  Glucose: Lab Results  Component Value Date   GLUCAP 150 (H) 02/01/2023   GLUCAP 172 (H) 01/22/2023   GLUCAP 168 (H) 01/22/2023   GLUCAP 131 (H) 01/18/2023   GLUCAP 155 (H) 01/18/2023     Exercise Target Goals: Exercise Program Goal: Individual exercise prescription set using results from initial 6 min walk test and THRR while considering  patient's activity barriers and safety.   Exercise Prescription Goal: Initial exercise prescription builds to 30-45 minutes a day of aerobic activity, 2-3 days per week.  Home exercise guidelines will be given to patient during program as part of exercise prescription that the participant will acknowledge.  Activity Barriers & Risk Stratification:  Activity Barriers & Cardiac Risk Stratification - 01/14/23 1537       Activity Barriers & Cardiac Risk Stratification   Activity Barriers Shortness of Breath   with hills   Cardiac Risk Stratification High   <5 METs on            6 Minute Walk:  6 Minute Walk     Row Name 01/14/23 1550         6 Minute Walk   Phase Initial     Distance 1045 feet     Walk Time 6 minutes     # of Rest Breaks 0     MPH 1.98     METS 2.55     RPE 11     Perceived Dyspnea  0     VO2 Peak 8.94     Symptoms No     Resting HR 92 bpm     Resting BP 118/62     Resting Oxygen Saturation  100 %     Exercise Oxygen Saturation  during 6 min walk 99 %     Max Ex. HR 109 bpm     Max Ex. BP 130/80     2 Minute Post BP 116/74              Oxygen Initial Assessment:   Oxygen Re-Evaluation:   Oxygen Discharge (Final Oxygen Re-Evaluation):   Initial Exercise Prescription:  Initial Exercise Prescription - 01/14/23 1500       Date of Initial Exercise RX and Referring Provider   Date 01/14/23    Referring Provider Elder Negus, MD    Expected Discharge Date 03/26/23      Treadmill   MPH 1.7    Grade 0  Minutes 15    METs 2      NuStep   Level 1    SPM 65    Minutes 15     METs 2      Prescription Details   Frequency (times per week) 3    Duration Progress to 30 minutes of continuous aerobic without signs/symptoms of physical distress      Intensity   THRR 40-80% of Max Heartrate 66-133    Ratings of Perceived Exertion 11-13    Perceived Dyspnea 0-4      Progression   Progression Continue progressive overload as per policy without signs/symptoms or physical distress.      Resistance Training   Training Prescription Yes    Weight 2    Reps 10-15             Perform Capillary Blood Glucose checks as needed.  Exercise Prescription Changes:   Exercise Prescription Changes     Row Name 01/18/23 [phone]             Response to Exercise   Blood Pressure (Admit) 112/70       Blood Pressure (Exercise) 134/84       Blood Pressure (Exit) 102/64       Heart Rate (Admit) 88 bpm       Heart Rate (Exercise) 115 bpm       Heart Rate (Exit) 96 bpm       Rating of Perceived Exertion (Exercise) 7       Perceived Dyspnea (Exercise) 0       Symptoms none       Comments Pt first day in the pritikin ICR program       Duration Progress to 30 minutes of  aerobic without signs/symptoms of physical distress       Intensity THRR unchanged         Progression   Progression Continue to progress workloads to maintain intensity without signs/symptoms of physical distress.       Average METs 2.2         Resistance Training   Training Prescription Yes       Weight 2       Reps 10-15       Time 10 Minutes         Treadmill   MPH 1.7       Grade 0       Minutes 15       METs 2.3         NuStep   Level 1       SPM 107       Minutes 15       METs 2.1                Exercise Comments:   Exercise Comments     Row Name 01/18/23 0933           Exercise Comments Pt first day in the Pritikin ICR program. Pt tolerated exercise well with an average MET level of 2.2. Pt is learning her THRR, RPE and ExRx. Off to a great start. Will continue to  monitor pt and progress workloads as tolerated without sign or symptom                Exercise Goals and Review:   Exercise Goals     Row Name 01/14/23 1538             Exercise Goals   Increase Physical  Activity Yes       Intervention Provide advice, education, support and counseling about physical activity/exercise needs.;Develop an individualized exercise prescription for aerobic and resistive training based on initial evaluation findings, risk stratification, comorbidities and participant's personal goals.       Expected Outcomes Short Term: Attend rehab on a regular basis to increase amount of physical activity.;Long Term: Exercising regularly at least 3-5 days a week.;Long Term: Add in home exercise to make exercise part of routine and to increase amount of physical activity.       Increase Strength and Stamina Yes       Intervention Provide advice, education, support and counseling about physical activity/exercise needs.;Develop an individualized exercise prescription for aerobic and resistive training based on initial evaluation findings, risk stratification, comorbidities and participant's personal goals.       Expected Outcomes Short Term: Increase workloads from initial exercise prescription for resistance, speed, and METs.;Short Term: Perform resistance training exercises routinely during rehab and add in resistance training at home;Long Term: Improve cardiorespiratory fitness, muscular endurance and strength as measured by increased METs and functional capacity ( )       Able to understand and use rate of perceived exertion (RPE) scale Yes       Intervention Provide education and explanation on how to use RPE scale       Expected Outcomes Short Term: Able to use RPE daily in rehab to express subjective intensity level;Long Term:  Able to use RPE to guide intensity level when exercising independently       Able to understand and use Dyspnea scale Yes       Intervention  Provide education and explanation on how to use Dyspnea scale       Expected Outcomes Short Term: Able to use Dyspnea scale daily in rehab to express subjective sense of shortness of breath during exertion;Long Term: Able to use Dyspnea scale to guide intensity level when exercising independently       Knowledge and understanding of Target Heart Rate Range (THRR) Yes       Intervention Provide education and explanation of THRR including how the numbers were predicted and where they are located for reference       Expected Outcomes Short Term: Able to state/look up THRR;Long Term: Able to use THRR to govern intensity when exercising independently;Short Term: Able to use daily as guideline for intensity in rehab       Understanding of Exercise Prescription Yes       Intervention Provide education, explanation, and written materials on patient's individual exercise prescription       Expected Outcomes Short Term: Able to explain program exercise prescription;Long Term: Able to explain home exercise prescription to exercise independently                Exercise Goals Re-Evaluation :  Exercise Goals Re-Evaluation     Row Name 01/18/23 0932             Exercise Goal Re-Evaluation   Exercise Goals Review Increase Physical Activity;Understanding of Exercise Prescription;Increase Strength and Stamina;Knowledge and understanding of Target Heart Rate Range (THRR);Able to understand and use rate of perceived exertion (RPE) scale       Comments Pt first day in the Pritikin ICR program. Pt tolerated exercise well with an average MET level of 2.2. Pt is learning her THRR, RPE and ExRx. Off to a great start`       Expected Outcomes Will continue to monitor pt and progress  workloads as tolerated without sign or symptom                Discharge Exercise Prescription (Final Exercise Prescription Changes):  Exercise Prescription Changes - 01/18/23 0928       Response to Exercise   Blood Pressure  (Admit) 112/70    Blood Pressure (Exercise) 134/84    Blood Pressure (Exit) 102/64    Heart Rate (Admit) 88 bpm    Heart Rate (Exercise) 115 bpm    Heart Rate (Exit) 96 bpm    Rating of Perceived Exertion (Exercise) 7    Perceived Dyspnea (Exercise) 0    Symptoms none    Comments Pt first day in the pritikin ICR program    Duration Progress to 30 minutes of  aerobic without signs/symptoms of physical distress    Intensity THRR unchanged      Progression   Progression Continue to progress workloads to maintain intensity without signs/symptoms of physical distress.    Average METs 2.2      Resistance Training   Training Prescription Yes    Weight 2    Reps 10-15    Time 10 Minutes      Treadmill   MPH 1.7    Grade 0    Minutes 15    METs 2.3      NuStep   Level 1    SPM 107    Minutes 15    METs 2.1             Nutrition:  Target Goals: Understanding of nutrition guidelines, daily intake of sodium 1500mg , cholesterol 200mg , calories 30% from fat and 7% or less from saturated fats, daily to have 5 or more servings of fruits and vegetables.  Biometrics:  Pre Biometrics - 01/14/23 1452       Pre Biometrics   Waist Circumference 51.5 inches    Hip Circumference 56 inches    Waist to Hip Ratio 0.92 %    Triceps Skinfold 55 mm    % Body Fat 56.3 %    Grip Strength 32 kg    Flexibility 16.5 in    Single Leg Stand 20.87 seconds              Nutrition Therapy Plan and Nutrition Goals:  Nutrition Therapy & Goals - 01/18/23 0857       Nutrition Therapy   Diet Heart Healthy/Carbohydrate Consistent    Drug/Food Interactions Statins/Certain Fruits      Personal Nutrition Goals   Nutrition Goal Patient to identify strategies for reducing cardiovascular risk by attending the [facility]in education and nutrition series weekly.    Personal Goal #2 Patient to improve diet quality by using the plate method as a guide for meal planning to include lean protein/plant  protein, fruits, vegetables, whole grains, nonfat dairy as part of a well-balanced diet.    Personal Goal #3 Patient to reduce sodium intake to 1500mg  per day    Personal Goal #4 Patient to identify strategies for blood sugar control with A1c goal <7%.    Comments Lennix previously started cardiac rehab in fall 2023 but had to stop due to chest pain/readmission. She has started making many lifestyle changes at that time. She will continue to benefit from participation in intensive cardiac rehab for nutrition, exercise, and lifestyle modification.      Intervention Plan   Intervention Prescribe, educate and counsel regarding individualized specific dietary modifications aiming towards targeted core components such as weight, hypertension, lipid management,  diabetes, heart failure and other comorbidities.;Nutrition handout(s) given to patient.    Expected Outcomes Short Term Goal: Understand basic principles of dietary content, such as calories, fat, sodium, cholesterol and nutrients.;Long Term Goal: Adherence to prescribed nutrition plan.             Nutrition Assessments:  MEDIFICTS Score Key: ?70 Need to make dietary changes  40-70 Heart Healthy Diet ? 40 Therapeutic Level Cholesterol Diet   Flowsheet Row CARDIAC REHAB PHASE II EXERCISE from 07/22/2022 in [facility]  Picture Your Plate Total Score on Admission 71      Picture Your Plate Scores: <16 Unhealthy dietary pattern with much room for improvement. 41-50 Dietary pattern unlikely to meet recommendations for good health and room for improvement. 51-60 More healthful dietary pattern, with some room for improvement.  >60 Healthy dietary pattern, although there may be some specific behaviors that could be improved.    Nutrition Goals Re-Evaluation:  Nutrition Goals Re-Evaluation     Row Name 01/18/23 0857             Goals   Current Weight 233 lb 11 oz (106  kg)       Comment lipoproteinA 196.2 taking repatha + statin, lipids WNL, A1c 8.7 prescribed ozempic, metformin       Expected Outcome Walta previously started cardiac rehab in fall 2023 but had to stop due to chest pain/readmission. She has started making many lifestyle changes at that time. She will continue to benefit from participation in intensive cardiac rehab for nutrition, exercise, and lifestyle modification.                Nutrition Goals Re-Evaluation:  Nutrition Goals Re-Evaluation     Row Name 01/18/23 0857             Goals   Current Weight 233 lb 11 oz (106 kg)       Comment lipoproteinA 196.2 taking repatha + statin, lipids WNL, A1c 8.7 prescribed ozempic, metformin       Expected Outcome Amiel previously started cardiac rehab in fall 2023 but had to stop due to chest pain/readmission. She has started making many lifestyle changes at that time. She will continue to benefit from participation in intensive cardiac rehab for nutrition, exercise, and lifestyle modification.                Nutrition Goals Discharge (Final Nutrition Goals Re-Evaluation):  Nutrition Goals Re-Evaluation - 01/18/23 0857       Goals   Current Weight 233 lb 11 oz (106 kg)    Comment lipoproteinA 196.2 taking repatha + statin, lipids WNL, A1c 8.7 prescribed ozempic, metformin    Expected Outcome Lorynn previously started cardiac rehab in fall 2023 but had to stop due to chest pain/readmission. She has started making many lifestyle changes at that time. She will continue to benefit from participation in intensive cardiac rehab for nutrition, exercise, and lifestyle modification.             Psychosocial: Target Goals: Acknowledge presence or absence of significant depression and/or stress, maximize coping skills, provide positive support system. Participant is able to verbalize types and ability to use techniques and skills needed for reducing stress and depression.  Initial  Review & Psychosocial Screening:  Initial Psych Review & Screening - 01/14/23 1539       Initial Review   Current issues with None Identified      Family Dynamics  Good Support System? Yes   Shereese has her 3 daughters and dog for support     Barriers   Psychosocial barriers to participate in program There are no identifiable barriers or psychosocial needs.      Screening Interventions   Interventions Encouraged to exercise;Provide feedback about the scores to participant    Expected Outcomes Short Term goal: Identification and review with participant of any Quality of Life or Depression concerns found by scoring the questionnaire.;Long Term goal: The participant improves quality of Life and PHQ9 Scores as seen by post scores and/or verbalization of changes             Quality of Life Scores:  Quality of Life - 01/14/23 1618       Quality of Life   Select Quality of Life      Quality of Life Scores   Health/Function Pre 29.2 %    Socioeconomic Pre 27.43 %    Psych/Spiritual Pre 30 %    Family Pre 28.8 %    GLOBAL Pre 28.91 %            Scores of 19 and below usually indicate a poorer quality of life in these areas.  A difference of  2-3 points is a clinically meaningful difference.  A difference of 2-3 points in the total score of the Quality of Life Index has been associated with significant improvement in overall quality of life, self-image, physical symptoms, and general health in studies assessing change in quality of life.  PHQ-9: Review Flowsheet       01/14/2023 07/14/2022 04/02/2022 12/22/2017  Depression screen PHQ 2/9  Decreased Interest 0 0 3 0  Down, Depressed, Hopeless 0 0 0 0  PHQ - 2 Score 0 0 3 0  Altered sleeping 0 - 3 -  Tired, decreased energy 0 - 3 -  Change in appetite 0 - 1 -  Feeling bad or failure about yourself  0 - 0 -  Trouble concentrating 0 - 0 -  Moving slowly or fidgety/restless 0 - 3 -  Suicidal thoughts 0 - 0 -  PHQ-9 Score 0 -  13 -  Difficult doing work/chores - - Very difficult -   Interpretation of Total Score  Total Score Depression Severity:  1-4 = Minimal depression, 5-9 = Mild depression, 10-14 = Moderate depression, 15-19 = Moderately severe depression, 20-27 = Severe depression   Psychosocial Evaluation and Intervention:   Psychosocial Re-Evaluation:  Psychosocial Re-Evaluation     Row Name 02/01/23 [phone]             Psychosocial Re-Evaluation   Current issues with None Identified       Interventions Encouraged to attend Cardiac Rehabilitation for the exercise       Continue Psychosocial Services  No Follow up required                Psychosocial Discharge (Final Psychosocial Re-Evaluation):  Psychosocial Re-Evaluation - 02/01/23 0833       Psychosocial Re-Evaluation   Current issues with None Identified    Interventions Encouraged to attend Cardiac Rehabilitation for the exercise    Continue Psychosocial Services  No Follow up required             Vocational Rehabilitation: Provide vocational rehab assistance to qualifying candidates.   Vocational Rehab Evaluation & Intervention:  Vocational Rehab - 01/14/23 1540       Initial Vocational Rehab Evaluation & Intervention   Assessment shows need for Vocational  Rehabilitation No   Laronda is unemplyed, however not interested in looking for a job currently            Education: Education Goals: Education classes will be provided on a weekly basis, covering required topics. Participant will state understanding/return demonstration of topics presented.    Education     Row Name 01/18/23 0900     Education   Cardiac Education Topics Pritikin   Select Workshops     Workshops   Educator Exercise Physiologist   Select Exercise   Exercise Workshop Exercise Basics: Building Your Action Plan   Instruction Review Code 1- Verbalizes Understanding   Class Start Time 0815   Class Stop Time 0903   Class Time Calculation  (min) 48 min    Row Name 01/22/23 0900     Education   Cardiac Education Topics Pritikin   Select Core Videos     Core Videos   Educator Dietitian   Select Nutrition   Nutrition Calorie Density   Instruction Review Code 1- Verbalizes Understanding   Class Start Time 0815   Class Stop Time 0856   Class Time Calculation (min) 41 min    Row Name 01/29/23 0900     Education   Cardiac Education Topics Pritikin   Select Core Videos     Core Videos   Educator Exercise Physiologist   Select Exercise Education   Exercise Education Move It!   Instruction Review Code 1- Verbalizes Understanding   Class Start Time [phone]   Class Stop Time 0858   Class Time Calculation (min) 42 min            Core Videos: Exercise    Move It!  Clinical staff conducted group or individual video education with verbal and written material and guidebook.  Patient learns the recommended Pritikin exercise program. Exercise with the goal of living a long, healthy life. Some of the health benefits of exercise include controlled diabetes, healthier blood pressure levels, improved cholesterol levels, improved heart and lung capacity, improved sleep, and better body composition. Everyone should speak with their doctor before starting or changing an exercise routine.  Biomechanical Limitations Clinical staff conducted group or individual video education with verbal and written material and guidebook.  Patient learns how biomechanical limitations can impact exercise and how we can mitigate and possibly overcome limitations to have an impactful and balanced exercise routine.  Body Composition Clinical staff conducted group or individual video education with verbal and written material and guidebook.  Patient learns that body composition (ratio of muscle mass to fat mass) is a key component to assessing overall fitness, rather than body weight alone. Increased fat mass, especially visceral belly fat, can put [place]  at increased risk for metabolic syndrome, type 2 diabetes, heart disease, and even death. It is recommended to combine diet and exercise (cardiovascular and resistance training) to improve your body composition. Seek guidance from your physician and exercise physiologist before implementing an exercise routine.  Exercise Action Plan Clinical staff conducted group or individual video education with verbal and written material and guidebook.  Patient learns the recommended strategies to achieve and enjoy long-term exercise adherence, including variety, self-motivation, self-efficacy, and positive decision making. Benefits of exercise include fitness, good health, weight management, more energy, better sleep, less stress, and overall well-being.  Medical   Heart Disease Risk Reduction Clinical staff conducted group or individual video education with verbal and written material and guidebook.  Patient learns our heart is our most vital  organ as it circulates oxygen, nutrients, white blood cells, and hormones throughout the entire body, and carries waste away. Data supports a plant-based eating plan like the Pritikin Program for its effectiveness in slowing progression of and reversing heart disease. The video provides a number of recommendations to address heart disease.   Metabolic Syndrome and Belly Fat  Clinical staff conducted group or individual video education with verbal and written material and guidebook.  Patient learns what metabolic syndrome is, how it leads to heart disease, and how one can reverse it and keep it from coming back. You have metabolic syndrome if you have 3 of the following 5 criteria: abdominal obesity, high blood pressure, high triglycerides, low HDL cholesterol, and high blood sugar.  Hypertension and Heart Disease Clinical staff conducted group or individual video education with verbal and written material and guidebook.  Patient learns that high blood pressure, or  hypertension, is very common in the [place]. Hypertension is largely due to excessive salt intake, but other important risk factors include being overweight, physical inactivity, drinking too much alcohol, smoking, and not eating enough potassium from fruits and vegetables. High blood pressure is a leading risk factor for heart attack, stroke, congestive heart failure, dementia, kidney failure, and premature death. Long-term effects of excessive salt intake include stiffening of the arteries and thickening of heart muscle and organ damage. Recommendations include ways to reduce hypertension and the risk of heart disease.  Diseases of Our Time - Focusing on Diabetes Clinical staff conducted group or individual video education with verbal and written material and guidebook.  Patient learns why the best way to stop diseases of our time is prevention, through food and other lifestyle changes. Medicine (such as prescription pills and surgeries) is often only a Band-Aid on the problem, not a long-term solution. Most common diseases of our time include obesity, type 2 diabetes, hypertension, heart disease, and cancer. The Pritikin Program is recommended and has been proven to help reduce, reverse, and/or prevent the damaging effects of metabolic syndrome.  Nutrition   Overview of the Pritikin Eating Plan  Clinical staff conducted group or individual video education with verbal and written material and guidebook.  Patient learns about the Pritikin Eating Plan for disease risk reduction. The Pritikin Eating Plan emphasizes a wide variety of unrefined, minimally-processed carbohydrates, like fruits, vegetables, whole grains, and legumes. Go, Caution, and Stop food choices are explained. Plant-based and lean animal proteins are emphasized. Rationale provided for low sodium intake for blood pressure control, low added sugars for blood sugar stabilization, and low added fats and oils for coronary artery disease  risk reduction and weight management.  Calorie Density  Clinical staff conducted group or individual video education with verbal and written material and guidebook.  Patient learns about calorie density and how it impacts the Pritikin Eating Plan. Knowing the characteristics of the food you choose will help you decide whether those foods will lead to weight gain or weight loss, and whether you want to consume more or less of them. Weight loss is usually a side effect of the Pritikin Eating Plan because of its focus on low calorie-dense foods.  Label Reading  Clinical staff conducted group or individual video education with verbal and written material and guidebook.  Patient learns about the Pritikin recommended label reading guidelines and corresponding recommendations regarding calorie density, added sugars, sodium content, and whole grains.  Dining Out - Part 1  Clinical staff conducted group or individual video education with verbal and  written material and guidebook.  Patient learns that restaurant meals can be sabotaging because they can be so high in calories, fat, sodium, and/or sugar. Patient learns recommended strategies on how to positively address this and avoid unhealthy pitfalls.  Facts on Fats  Clinical staff conducted group or individual video education with verbal and written material and guidebook.  Patient learns that lifestyle modifications can be just as effective, if not more so, as many medications for lowering your risk of heart disease. A Pritikin lifestyle can help to reduce your risk of inflammation and atherosclerosis (cholesterol build-up, or plaque, in the artery walls). Lifestyle interventions such as dietary choices and physical activity address the cause of atherosclerosis. A review of the types of fats and their impact on blood cholesterol levels, along with dietary recommendations to reduce fat intake is also included.  Nutrition Action Plan  Clinical staff  conducted group or individual video education with verbal and written material and guidebook.  Patient learns how to incorporate Pritikin recommendations into their lifestyle. Recommendations include planning and keeping personal health goals in mind as an important part of their success.  Healthy Mind-Set    Healthy Minds, Bodies, Hearts  Clinical staff conducted group or individual video education with verbal and written material and guidebook.  Patient learns how to identify when they are stressed. Video will discuss the impact of that stress, as well as the many benefits of stress management. Patient will also be introduced to stress management techniques. The way we think, act, and feel has an impact on our hearts.  How Our Thoughts Can Heal Our Hearts  Clinical staff conducted group or individual video education with verbal and written material and guidebook.  Patient learns that negative thoughts can cause depression and anxiety. This can result in negative lifestyle behavior and serious health problems. Cognitive behavioral therapy is an effective method to help control our thoughts in order to change and improve our emotional outlook.  Additional Videos:  Exercise    Improving Performance  Clinical staff conducted group or individual video education with verbal and written material and guidebook.  Patient learns to use a non-linear approach by alternating intensity levels and lengths of time spent exercising to help burn more calories and lose more body fat. Cardiovascular exercise helps improve heart health, metabolism, hormonal balance, blood sugar control, and recovery from fatigue. Resistance training improves strength, endurance, balance, coordination, reaction time, metabolism, and muscle mass. Flexibility exercise improves circulation, posture, and balance. Seek guidance from your physician and exercise physiologist before implementing an exercise routine and learn your capabilities  and proper form for all exercise.  Introduction to Yoga  Clinical staff conducted group or individual video education with verbal and written material and guidebook.  Patient learns about yoga, a discipline of the coming together of mind, breath, and body. The benefits of yoga include improved flexibility, improved range of motion, better posture and core strength, increased lung function, weight loss, and positive self-image. Yoga's heart health benefits include lowered blood pressure, healthier heart rate, decreased cholesterol and triglyceride levels, improved immune function, and reduced stress. Seek guidance from your physician and exercise physiologist before implementing an exercise routine and learn your capabilities and proper form for all exercise.  Medical   Aging: Enhancing Your Quality of Life  Clinical staff conducted group or individual video education with verbal and written material and guidebook.  Patient learns key strategies and recommendations to stay in good physical health and enhance quality of life, such as  prevention strategies, having an advocate, securing a Health Care Proxy and Power of Attorney, and keeping a list of medications and system for tracking them. It also discusses how to avoid risk for bone loss.  Biology of Weight Control  Clinical staff conducted group or individual video education with verbal and written material and guidebook.  Patient learns that weight gain occurs because we consume more calories than we burn (eating more, moving less). Even if your body weight is normal, you may have higher ratios of fat compared to muscle mass. Too much body fat puts you at increased risk for cardiovascular disease, heart attack, stroke, type 2 diabetes, and obesity-related cancers. In addition to exercise, following the Pritikin Eating Plan can help reduce your risk.  Decoding Lab Results  Clinical staff conducted group or individual video education with verbal and  written material and guidebook.  Patient learns that lab test reflects one measurement whose values change over time and are influenced by many factors, including medication, stress, sleep, exercise, food, hydration, pre-existing medical conditions, and more. It is recommended to use the knowledge from this video to become more involved with your lab results and evaluate your numbers to speak with your doctor.   Diseases of Our Time - Overview  Clinical staff conducted group or individual video education with verbal and written material and guidebook.  Patient learns that according to the CDC, 50% to 70% of chronic diseases (such as obesity, type 2 diabetes, elevated lipids, hypertension, and heart disease) are avoidable through lifestyle improvements including healthier food choices, listening to satiety cues, and increased physical activity.  Sleep Disorders Clinical staff conducted group or individual video education with verbal and written material and guidebook.  Patient learns how good quality and duration of sleep are important to overall health and well-being. Patient also learns about sleep disorders and how they impact health along with recommendations to address them, including discussing with a physician.  Nutrition  Dining Out - Part 2 Clinical staff conducted group or individual video education with verbal and written material and guidebook.  Patient learns how to plan ahead and communicate in order to maximize their dining experience in a healthy and nutritious manner. Included are recommended food choices based on the type of restaurant the patient is visiting.   Fueling a Banker conducted group or individual video education with verbal and written material and guidebook.  There is a strong connection between our food choices and our health. Diseases like obesity and type 2 diabetes are very prevalent and are in large-part due to lifestyle choices. The  Pritikin Eating Plan provides plenty of food and hunger-curbing satisfaction. It is easy to follow, affordable, and helps reduce health risks.  Menu Workshop  Clinical staff conducted group or individual video education with verbal and written material and guidebook.  Patient learns that restaurant meals can sabotage health goals because they are often packed with calories, fat, sodium, and sugar. Recommendations include strategies to plan ahead and to communicate with the manager, chef, or server to help order a healthier meal.  Planning Your Eating Strategy  Clinical staff conducted group or individual video education with verbal and written material and guidebook.  Patient learns about the Pritikin Eating Plan and its benefit of reducing the risk of disease. The Pritikin Eating Plan does not focus on calories. Instead, it emphasizes high-quality, nutrient-rich foods. By knowing the characteristics of the foods, we choose, we can determine their calorie density and make informed  decisions.  Targeting Your Nutrition Priorities  Clinical staff conducted group or individual video education with verbal and written material and guidebook.  Patient learns that lifestyle habits have a tremendous impact on disease risk and progression. This video provides eating and physical activity recommendations based on your personal health goals, such as reducing LDL cholesterol, losing weight, preventing or controlling type 2 diabetes, and reducing high blood pressure.  Vitamins and Minerals  Clinical staff conducted group or individual video education with verbal and written material and guidebook.  Patient learns different ways to obtain key vitamins and minerals, including through a recommended healthy diet. It is important to discuss all supplements you take with your doctor.   Healthy Mind-Set    Smoking Cessation  Clinical staff conducted group or individual video education with verbal and written  material and guidebook.  Patient learns that cigarette smoking and tobacco addiction pose a serious health risk which affects millions of people. Stopping smoking will significantly reduce the risk of heart disease, lung disease, and many forms of cancer. Recommended strategies for quitting are covered, including working with your doctor to develop a successful plan.  Culinary   Becoming a Set designer conducted group or individual video education with verbal and written material and guidebook.  Patient learns that cooking at home can be healthy, cost-effective, quick, and puts them in control. Keys to cooking healthy recipes will include looking at your recipe, assessing your equipment needs, planning ahead, making it simple, choosing cost-effective seasonal ingredients, and limiting the use of added fats, salts, and sugars.  Cooking - Breakfast and Snacks  Clinical staff conducted group or individual video education with verbal and written material and guidebook.  Patient learns how important breakfast is to satiety and nutrition through the entire day. Recommendations include key foods to eat during breakfast to help stabilize blood sugar levels and to prevent overeating at meals later in the day. Planning ahead is also a key component.  Cooking - Educational psychologist conducted group or individual video education with verbal and written material and guidebook.  Patient learns eating strategies to improve overall health, including an approach to cook more at home. Recommendations include thinking of animal protein as a side on your plate rather than center stage and focusing instead on lower calorie dense options like vegetables, fruits, whole grains, and plant-based proteins, such as beans. Making sauces in large quantities to freeze for later and leaving the skin on your vegetables are also recommended to maximize your experience.  Cooking - Healthy Salads and  Dressing Clinical staff conducted group or individual video education with verbal and written material and guidebook.  Patient learns that vegetables, fruits, whole grains, and legumes are the foundations of the Pritikin Eating Plan. Recommendations include how to incorporate each of these in flavorful and healthy salads, and how to create homemade salad dressings. Proper handling of ingredients is also covered. Cooking - Soups and [employer] - Soups and Desserts Clinical staff conducted group or individual video education with verbal and written material and guidebook.  Patient learns that Pritikin soups and desserts make for easy, nutritious, and delicious snacks and meal components that are low in sodium, fat, sugar, and calorie density, while high in vitamins, minerals, and filling fiber. Recommendations include simple and healthy ideas for soups and desserts.   Overview     The Pritikin Solution Program Overview Clinical staff conducted group or individual video education with verbal and written  material and guidebook.  Patient learns that the results of the Pritikin Program have been documented in more than 100 articles published in peer-reviewed journals, and the benefits include reducing risk factors for (and, in some cases, even reversing) high cholesterol, high blood pressure, type 2 diabetes, obesity, and more! An overview of the three key pillars of the Pritikin Program will be covered: eating well, doing regular exercise, and having a healthy mind-set.  WORKSHOPS  Exercise: Exercise Basics: Building Your Action Plan Clinical staff led group instruction and group discussion with PowerPoint presentation and patient guidebook. To enhance the learning environment the use of posters, models and videos may be added. At the conclusion of this workshop, patients will comprehend the difference between physical activity and exercise, as well as the benefits of incorporating both, into  their routine. Patients will understand the FITT (Frequency, Intensity, Time, and Type) principle and how to use it to build an exercise action plan. In addition, safety concerns and other considerations for exercise and cardiac rehab will be addressed by the presenter. The purpose of this lesson is to promote a comprehensive and effective weekly exercise routine in order to improve patients' overall level of fitness.   Managing Heart Disease: Your Path to a Healthier Heart Clinical staff led group instruction and group discussion with PowerPoint presentation and patient guidebook. To enhance the learning environment the use of posters, models and videos may be added.At the conclusion of this workshop, patients will understand the anatomy and physiology of the heart. Additionally, they will understand how Pritikin's three pillars impact the risk factors, the progression, and the management of heart disease.  The purpose of this lesson is to provide a high-level overview of the heart, heart disease, and how the Pritikin lifestyle positively impacts risk factors.  Exercise Biomechanics Clinical staff led group instruction and group discussion with PowerPoint presentation and patient guidebook. To enhance the learning environment the use of posters, models and videos may be added. Patients will learn how the structural parts of their bodies function and how these functions impact their daily activities, movement, and exercise. Patients will learn how to promote a neutral spine, learn how to manage pain, and identify ways to improve their physical movement in order to promote healthy living. The purpose of this lesson is to expose patients to common physical limitations that impact physical activity. Participants will learn practical ways to adapt and manage aches and pains, and to minimize their effect on regular exercise. Patients will learn how to maintain good posture while sitting, walking, and  lifting.  Balance Training and Fall Prevention  Clinical staff led group instruction and group discussion with PowerPoint presentation and patient guidebook. To enhance the learning environment the use of posters, models and videos may be added. At the conclusion of this workshop, patients will understand the importance of their sensorimotor skills (vision, proprioception, and the vestibular system) in maintaining their ability to balance as they age. Patients will apply a variety of balancing exercises that are appropriate for their current level of function. Patients will understand the common causes for poor balance, possible solutions to these problems, and ways to modify their physical environment in order to minimize their fall risk. The purpose of this lesson is to teach patients about the importance of maintaining balance as they age and ways to minimize their risk of falling.  WORKSHOPS   Nutrition:  Fueling a Ship broker led group instruction and group discussion with PowerPoint presentation and patient guidebook.  To enhance the learning environment the use of posters, models and videos may be added. Patients will review the foundational principles of the Pritikin Eating Plan and understand what constitutes a serving size in each of the food groups. Patients will also learn Pritikin-friendly foods that are better choices when away from home and review make-ahead meal and snack options. Calorie density will be reviewed and applied to three nutrition priorities: weight maintenance, weight loss, and weight gain. The purpose of this lesson is to reinforce (in a group setting) the key concepts around what patients are recommended to eat and how to apply these guidelines when away from home by planning and selecting Pritikin-friendly options. Patients will understand how calorie density may be adjusted for different weight management goals.  Mindful Eating  Clinical staff led  group instruction and group discussion with PowerPoint presentation and patient guidebook. To enhance the learning environment the use of posters, models and videos may be added. Patients will briefly review the concepts of the Pritikin Eating Plan and the importance of low-calorie dense foods. The concept of mindful eating will be introduced as well as the importance of paying attention to internal hunger signals. Triggers for non-hunger eating and techniques for dealing with triggers will be explored. The purpose of this lesson is to provide patients with the opportunity to review the basic principles of the Pritikin Eating Plan, discuss the value of eating mindfully and how to measure internal cues of hunger and fullness using the Hunger Scale. Patients will also discuss reasons for non-hunger eating and learn strategies to use for controlling emotional eating.  Targeting Your Nutrition Priorities Clinical staff led group instruction and group discussion with PowerPoint presentation and patient guidebook. To enhance the learning environment the use of posters, models and videos may be added. Patients will learn how to determine their genetic susceptibility to disease by reviewing their family history. Patients will gain insight into the importance of diet as part of an overall healthy lifestyle in mitigating the impact of genetics and other environmental insults. The purpose of this lesson is to provide patients with the opportunity to assess their personal nutrition priorities by looking at their family history, their own health history and current risk factors. Patients will also be able to discuss ways of prioritizing and modifying the Pritikin Eating Plan for their highest risk areas  Menu  Clinical staff led group instruction and group discussion with PowerPoint presentation and patient guidebook. To enhance the learning environment the use of posters, models and videos may be added. Using menus  brought in from [employer], or printed from [employer], patients will apply the Pritikin dining out guidelines that were presented in the [employer] video. Patients will also be able to practice these guidelines in a variety of provided scenarios. The purpose of this lesson is to provide patients with the opportunity to practice hands-on learning of the Pritikin Dining Out guidelines with actual menus and practice scenarios.  Label Reading Clinical staff led group instruction and group discussion with PowerPoint presentation and patient guidebook. To enhance the learning environment the use of posters, models and videos may be added. Patients will review and discuss the Pritikin label reading guidelines presented in Pritikin's Label Reading Educational series video. Using fool labels brought in from local grocery stores and markets, patients will apply the label reading guidelines and determine if the packaged food meet the Pritikin guidelines. The purpose of this lesson is to provide patients with the opportunity to  review, discuss, and practice hands-on learning of the Pritikin Label Reading guidelines with actual packaged food labels. Cooking School  Pritikin's [employer] are designed to teach patients ways to prepare quick, simple, and affordable recipes at home. The importance of nutrition's role in chronic disease risk reduction is reflected in its emphasis in the overall Pritikin program. By learning how to prepare essential core Pritikin Eating Plan recipes, patients will increase control over what they eat; be able to customize the flavor of foods without the use of added salt, sugar, or fat; and improve the quality of the food they consume. By learning a set of core recipes which are easily assembled, quickly prepared, and affordable, patients are more likely to prepare more healthy foods at home. These workshops focus on convenient breakfasts, simple  entres, side dishes, and desserts which can be prepared with minimal effort and are consistent with nutrition recommendations for cardiovascular risk reduction. Cooking [employer] are taught by a Armed forces logistics/support/administrative officer (RD) who has been trained by the [employer]. The chef or RD has a clear understanding of the importance of minimizing - if not completely eliminating - added fat, sugar, and sodium in recipes. Throughout the series of Cooking School Workshop sessions, patients will learn about healthy ingredients and efficient methods of cooking to build confidence in their capability to prepare    Cooking School weekly topics:  Adding Flavor- Sodium-Free  Fast and Healthy Breakfasts  Powerhouse Plant-Based Proteins  Satisfying Salads and Dressings  Simple Sides and Sauces  International Cuisine-Spotlight on the [employer] Zones  Delicious Desserts  Savory Soups  [employer] - Meals in a Astronomer Appetizers and Snacks  Comforting Weekend Breakfasts  One-Pot Wonders   Fast Evening Meals  Landscape architect Your Pritikin Plate  WORKSHOPS   Healthy Mindset (Psychosocial):  Focused Goals, Sustainable Changes Clinical staff led group instruction and group discussion with PowerPoint presentation and patient guidebook. To enhance the learning environment the use of posters, models and videos may be added. Patients will be able to apply effective goal setting strategies to establish at least one personal goal, and then take consistent, meaningful action toward that goal. They will learn to identify common barriers to achieving personal goals and develop strategies to overcome them. Patients will also gain an understanding of how our mind-set can impact our ability to achieve goals and the importance of cultivating a positive and growth-oriented mind-set. The purpose of this lesson is to provide patients with a deeper understanding of how to set and  achieve personal goals, as well as the tools and strategies needed to overcome common obstacles which may arise along the way.  From Head to Heart: The Power of a Healthy Outlook  Clinical staff led group instruction and group discussion with PowerPoint presentation and patient guidebook. To enhance the learning environment the use of posters, models and videos may be added. Patients will be able to recognize and describe the impact of emotions and mood on physical health. They will discover the importance of self-care and explore self-care practices which may work for them. Patients will also learn how to utilize the 4 C's to cultivate a healthier outlook and better manage stress and challenges. The purpose of this lesson is to demonstrate to patients how a healthy outlook is an essential part of maintaining good health, especially as they continue their cardiac rehab journey.  Healthy Sleep for a Healthy Heart Clinical staff led group instruction and  group discussion with PowerPoint presentation and patient guidebook. To enhance the learning environment the use of posters, models and videos may be added. At the conclusion of this workshop, patients will be able to demonstrate knowledge of the importance of sleep to overall health, well-being, and quality of life. They will understand the symptoms of, and treatments for, common sleep disorders. Patients will also be able to identify daytime and nighttime behaviors which impact sleep, and they will be able to apply these tools to help manage sleep-related challenges. The purpose of this lesson is to provide patients with a general overview of sleep and outline the importance of quality sleep. Patients will learn about a few of the most common sleep disorders. Patients will also be introduced to the concept of "sleep hygiene," and discover ways to self-manage certain sleeping problems through simple daily behavior changes. Finally, the workshop will motivate  patients by clarifying the links between quality sleep and their goals of heart-healthy living.   Recognizing and Reducing Stress Clinical staff led group instruction and group discussion with PowerPoint presentation and patient guidebook. To enhance the learning environment the use of posters, models and videos may be added. At the conclusion of this workshop, patients will be able to understand the types of stress reactions, differentiate between acute and chronic stress, and recognize the impact that chronic stress has on their health. They will also be able to apply different coping mechanisms, such as reframing negative self-talk. Patients will have the opportunity to practice a variety of stress management techniques, such as deep abdominal breathing, progressive muscle relaxation, and/or guided imagery.  The purpose of this lesson is to educate patients on the role of stress in their lives and to provide healthy techniques for coping with it.  Learning Barriers/Preferences:  Learning Barriers/Preferences - 01/14/23 1540       Learning Barriers/Preferences   Learning Barriers Sight   wears glasses   Learning Preferences Computer/Internet;Group Instruction;Individual Instruction;Pictoral;Skilled Demonstration;Verbal Instruction;Video;Written Material             Education Topics:  Knowledge Questionnaire Score:  Knowledge Questionnaire Score - 01/14/23 1546       Knowledge Questionnaire Score   Pre Score 19/24             Core Components/Risk Factors/Patient Goals at Admission:  Personal Goals and Risk Factors at Admission - 01/14/23 1541       Core Components/Risk Factors/Patient Goals on Admission    Weight Management Yes;Obesity;Weight Loss    Intervention Weight Management: Provide education and appropriate resources to help participant work on and attain dietary goals.;Obesity: Provide education and appropriate resources to help participant work on and attain dietary  goals.;Weight Management: Develop a combined nutrition and exercise program designed to reach desired caloric intake, while maintaining appropriate intake of nutrient and fiber, sodium and fats, and appropriate energy expenditure required for the weight goal.;Weight Management/Obesity: Establish reasonable short term and long term weight goals.    Admit Weight 233 lb 11 oz (106 kg)    Goal Weight: Long Term 160 lb (72.6 kg)   pt personal goal   Expected Outcomes Short Term: Continue to assess and modify interventions until short term weight is achieved;Long Term: Adherence to nutrition and physical activity/exercise program aimed toward attainment of established weight goal;Weight Loss: Understanding of general recommendations for a balanced deficit meal plan, which promotes 1-2 lb weight loss per week and includes a negative energy balance of [phone] kcal/d;Understanding recommendations for meals to include 15-35% energy as  protein, 25-35% energy from fat, 35-60% energy from carbohydrates, less than 200mg  of dietary cholesterol, 20-35 gm of total fiber daily;Understanding of distribution of calorie intake throughout the day with the consumption of 4-5 meals/snacks    Intervention Provide education about signs/symptoms and action to take for hypo/hyperglycemia.;Provide education about proper nutrition, including hydration, and aerobic/resistive exercise prescription along with prescribed medications to achieve blood glucose in normal ranges: Fasting glucose 65-99 mg/dL    Expected Outcomes Short Term: Participant verbalizes understanding of the signs/symptoms and immediate care of hyper/hypoglycemia, proper foot care and importance of medication, aerobic/resistive exercise and nutrition plan for blood glucose control.;Long Term: Attainment of HbA1C < 7%.    Hypertension Yes    Intervention Provide education on lifestyle modifcations including regular physical activity/exercise, weight management, moderate  sodium restriction and increased consumption of fresh fruit, vegetables, and low fat dairy, alcohol moderation, and smoking cessation.;Monitor prescription use compliance.    Expected Outcomes Short Term: Continued assessment and intervention until BP is < 140/45mm HG in hypertensive participants. < 130/32mm HG in hypertensive participants with diabetes, heart failure or chronic kidney disease.;Long Term: Maintenance of blood pressure at goal levels.    Lipids Yes    Intervention Provide education and support for participant on nutrition & aerobic/resistive exercise along with prescribed medications to achieve LDL 70mg , HDL >40mg .    Expected Outcomes Short Term: Participant states understanding of desired cholesterol values and is compliant with medications prescribed. Participant is following exercise prescription and nutrition guidelines.;Long Term: Cholesterol controlled with medications as prescribed, with individualized exercise RX and with personalized nutrition plan. Value goals: LDL < 70mg , HDL > 40 mg.             Core Components/Risk Factors/Patient Goals Review:   Goals and Risk Factor Review     Row Name 02/01/23 [phone]             Core Components/Risk Factors/Patient Goals Review   Personal Goals Review Weight Management/Obesity;Hypertension;Lipids;Diabetes       Review Resha is off to a good start to exercise at traditional cardiac rehab. Vital signs and CBG's have been stable. Azaria has lost 3.6 kg since starting the program       Expected Outcomes Ashlee will continue to participate in traditional cardiac rehab for exercise, nutrition and lifestyle modifications                Core Components/Risk Factors/Patient Goals at Discharge (Final Review):   Goals and Risk Factor Review - 02/01/23 0833       Core Components/Risk Factors/Patient Goals Review   Personal Goals Review Weight Management/Obesity;Hypertension;Lipids;Diabetes    Review Shamiyah is off to a  good start to exercise at traditional cardiac rehab. Vital signs and CBG's have been stable. Nettye has lost 3.6 kg since starting the program    Expected Outcomes Chandani will continue to participate in traditional cardiac rehab for exercise, nutrition and lifestyle modifications             ITP Comments:  ITP Comments     Row Name 01/14/23 1449 02/01/23 0831         ITP Comments Dr. Armanda Magic medical director. Introduction to pritikin education/ intensive cardiac rehab. Initial orientation packet reveiwed with patient. 30 Day ITP comments. Tirsa is off to a good start to exercise at traditional cardiac rehab               Comments: See ITP comments.Thayer Headings RN BSN

## 2023-02-03 ENCOUNTER — Encounter (HOSPITAL_COMMUNITY): Payer: Medicaid Other

## 2023-02-05 ENCOUNTER — Encounter (HOSPITAL_COMMUNITY)
Admission: RE | Admit: 2023-02-05 | Discharge: 2023-02-05 | Disposition: A | Discharge status: Home / Self Care | Payer: Medicaid Other | Source: Ambulatory Visit | Attending: Cardiology | Admitting: Cardiology

## 2023-02-05 DIAGNOSIS — Z955 Presence of coronary angioplasty implant and graft: Secondary | ICD-10-CM

## 2023-02-05 NOTE — Progress Notes (Signed)
CARDIAC REHAB PHASE 2   Reviewed home exercise with pt today. Pt is tolerating exercise well. Pt will continue to exercise on her own by walking outside for 30 minutes per session 3 days a week in addition to the 3 days in [facility]. Pt will also use 5lb hand weights 2 times a week in addition to the 2 days in the program. Advised pt on THRR, RPE scale, hydration and temperature/humidity precautions. Reinforced S/S to stop exercise and when to call MD vs 911. Encouraged warm up cool down and stretches with exercise sessions. Encouraged to carry cell phone when walking outdoors and fast acting sugar snacks for feelings of low blood sugar. Pt verbalized understanding, all questions were answered and pt was given a copy to take home

## 2023-02-06 ENCOUNTER — Other Ambulatory Visit: Payer: Self-pay | Admitting: Cardiology

## 2023-02-06 DIAGNOSIS — E785 Hyperlipidemia, unspecified: Secondary | ICD-10-CM

## 2023-02-06 DIAGNOSIS — E782 Mixed hyperlipidemia: Secondary | ICD-10-CM

## 2023-02-06 DIAGNOSIS — I25118 Atherosclerotic heart disease of native coronary artery with other forms of angina pectoris: Secondary | ICD-10-CM

## 2023-02-08 ENCOUNTER — Encounter (HOSPITAL_COMMUNITY): Payer: Medicaid Other

## 2023-02-10 ENCOUNTER — Encounter (HOSPITAL_COMMUNITY): Payer: Medicaid Other

## 2023-02-12 ENCOUNTER — Encounter (HOSPITAL_COMMUNITY)
Admission: RE | Admit: 2023-02-12 | Discharge: 2023-02-12 | Disposition: A | Discharge status: Home / Self Care | Payer: Medicaid Other | Source: Ambulatory Visit | Attending: Cardiology | Admitting: Cardiology

## 2023-02-12 DIAGNOSIS — Z955 Presence of coronary angioplasty implant and graft: Secondary | ICD-10-CM | POA: Diagnosis not present

## 2023-02-12 DIAGNOSIS — Z9861 Coronary angioplasty status: Secondary | ICD-10-CM

## 2023-02-15 ENCOUNTER — Encounter (HOSPITAL_COMMUNITY)
Admission: RE | Admit: 2023-02-15 | Discharge: 2023-02-15 | Disposition: A | Discharge status: Home / Self Care | Payer: Medicaid Other | Source: Ambulatory Visit | Attending: Cardiology | Admitting: Cardiology

## 2023-02-15 DIAGNOSIS — Z955 Presence of coronary angioplasty implant and graft: Secondary | ICD-10-CM | POA: Diagnosis not present

## 2023-02-15 DIAGNOSIS — Z9861 Coronary angioplasty status: Secondary | ICD-10-CM

## 2023-02-17 ENCOUNTER — Encounter (HOSPITAL_COMMUNITY): Payer: Medicaid Other

## 2023-02-19 ENCOUNTER — Encounter (HOSPITAL_COMMUNITY)
Admission: RE | Admit: 2023-02-19 | Discharge: 2023-02-19 | Disposition: A | Discharge status: Home / Self Care | Payer: Medicaid Other | Source: Ambulatory Visit | Attending: Cardiology | Admitting: Cardiology

## 2023-02-19 DIAGNOSIS — Z9861 Coronary angioplasty status: Secondary | ICD-10-CM | POA: Diagnosis present

## 2023-02-19 DIAGNOSIS — Z955 Presence of coronary angioplasty implant and graft: Secondary | ICD-10-CM | POA: Insufficient documentation

## 2023-02-22 ENCOUNTER — Encounter (HOSPITAL_COMMUNITY)
Admission: RE | Admit: 2023-02-22 | Discharge: 2023-02-22 | Disposition: A | Discharge status: Home / Self Care | Payer: Medicaid Other | Source: Ambulatory Visit | Attending: Cardiology | Admitting: Cardiology

## 2023-02-22 DIAGNOSIS — Z9861 Coronary angioplasty status: Secondary | ICD-10-CM

## 2023-02-22 DIAGNOSIS — Z955 Presence of coronary angioplasty implant and graft: Secondary | ICD-10-CM

## 2023-02-24 ENCOUNTER — Encounter (HOSPITAL_COMMUNITY): Payer: Medicaid Other

## 2023-02-24 NOTE — Progress Notes (Signed)
Cardiac Individual Treatment Plan  Patient Details  Name: [name] MRN: [id number] Date of Birth: [birth year] Referring Provider:   Flowsheet Row CARDIAC REHAB PHASE II ORIENTATION from 01/14/2023 in [facility]  Referring Provider Patwardhan, Anabel Bene, MD       Initial Encounter Date:  Flowsheet Row CARDIAC REHAB PHASE II ORIENTATION from 01/14/2023 in [facility]  Date 01/14/23       Visit Diagnosis: S/P right coronary artery (RCA) stent placement  Post PTCA  05/26/22 PTCA OM 1  Patient's Home Medications on Admission:  Current Outpatient Medications:    acetaminophen (TYLENOL) 325 MG tablet, Take 650 mg by mouth every 6 (six) hours as needed (pain.)., Disp: , Rfl:    amLODipine (NORVASC) 10 MG tablet, TAKE 1 TABLET(10 MG) BY MOUTH EVERY EVENING, Disp: 90 tablet, Rfl: 0   aspirin 81 MG chewable tablet, Chew 81 mg by mouth in the morning., Disp: , Rfl:    atorvastatin (LIPITOR) 40 MG tablet, Take 1 tablet (40 mg total) by mouth every evening., Disp: 90 tablet, Rfl: 3   empagliflozin (JARDIANCE) 10 MG TABS tablet, Take 1 tablet (10 mg total) by mouth daily., Disp: 90 tablet, Rfl: 1   Ferrous Sulfate (IRON) 325 (65 FE) MG TABS, Take 325 mg by mouth 2 (two) times daily., Disp: , Rfl:    furosemide (LASIX) 40 MG tablet, Take 40 mg by mouth as needed., Disp: , Rfl:    isosorbide mononitrate (IMDUR) 30 MG 24 hr tablet, Take 1 tablet (30 mg total) by mouth daily., Disp: 90 tablet, Rfl: 1   losartan (COZAAR) 25 MG tablet, Take 1 tablet (25 mg total) by mouth daily., Disp: 90 tablet, Rfl: 1   metFORMIN (GLUCOPHAGE) 1000 MG tablet, Take 500 mg by mouth 2 (two) times daily with a meal., Disp: , Rfl:    metoprolol succinate (TOPROL-XL) 100 MG 24 hr tablet, Take 1 tablet (100 mg total) by mouth daily. Take with or immediately following a meal., Disp: 90 tablet, Rfl: 1    nitroGLYCERIN (NITROSTAT) 0.4 MG SL tablet, Place 1 tablet (0.4 mg total) under the tongue every 5 (five) minutes as needed for chest pain., Disp: 30 tablet, Rfl: 0   ranolazine (RANEXA) 1000 MG SR tablet, Take 1 tablet (1,000 mg total) by mouth 2 (two) times daily., Disp: 60 tablet, Rfl: 1   REPATHA SURECLICK 140 MG/ML SOAJ, ADMINISTER 1 ML UNDER THE SKIN EVERY 14 DAYS, Disp: 2 mL, Rfl: 5   Semaglutide (OZEMPIC, 0.25 OR 0.5 MG/DOSE, [place]), Inject 0.5 mg into the skin every Sunday., Disp: , Rfl:    ticagrelor (BRILINTA) 90 MG TABS tablet, Take 1 tablet (90 mg total) by mouth 2 (two) times daily., Disp: 60 tablet, Rfl: 6  Past Medical History: Past Medical History:  Diagnosis Date   Anemia    CAD (coronary artery disease)    PCI with stent to RCA in 2019   CHF (congestive heart failure) (HCC) 04/2011   EF 15-20% from echo 05/19/11   DOE (dyspnea on exertion)    DVT (deep venous thrombosis) (HCC) 06/2011   LLE   Fatigue    HTN (hypertension)    Hyperlipidemia    Menorrhagia    with iron deficient anemia   NSTEMI (non-ST elevated myocardial infarction) (HCC) 10/26/2017   NSTEMI (non-ST elevated myocardial infarction) (HCC) 10/27/2017   Obesity    Orthopnea  Type II diabetes mellitus (HCC)     Tobacco Use: Social History   Tobacco Use  Smoking Status Former   Packs/day: 0.50   Years: 10.00   Additional pack years: 0.00   Total pack years: 5.00   Types: Cigarettes   Quit date: 10/19/1993   Years since quitting: 29.3  Smokeless Tobacco Never    Labs: Review Flowsheet  More data exists      Latest Ref Rng & Units 11/17/2019 03/10/2022 05/27/2022 07/27/2022 08/27/2022  Labs for ITP Cardiac and Pulmonary Rehab  Cholestrol 100 - 199 mg/dL 401  - 027  253  87   LDL (calc) 0 - 99 mg/dL 62  - 94  58  30   Direct LDL 0 - 99 mg/dL - - - 67  -  HDL-C >66 mg/dL 34  - 35  33  42   Trlycerides 0 - 149 mg/dL 51  - 69  71  68   Hemoglobin A1c 4.8 - 5.6 % - 8.7  - - -    Capillary Blood  Glucose: Lab Results  Component Value Date   GLUCAP 150 (H) 02/01/2023   GLUCAP 172 (H) 01/22/2023   GLUCAP 168 (H) 01/22/2023   GLUCAP 131 (H) 01/18/2023   GLUCAP 155 (H) 01/18/2023     Exercise Target Goals: Exercise Program Goal: Individual exercise prescription set using results from initial 6 min walk test and THRR while considering  patient's activity barriers and safety.   Exercise Prescription Goal: Initial exercise prescription builds to 30-45 minutes a day of aerobic activity, 2-3 days per week.  Home exercise guidelines will be given to patient during program as part of exercise prescription that the participant will acknowledge.  Activity Barriers & Risk Stratification:  Activity Barriers & Cardiac Risk Stratification - 01/14/23 1537       Activity Barriers & Cardiac Risk Stratification   Activity Barriers Shortness of Breath   with hills   Cardiac Risk Stratification High   <5 METs on            6 Minute Walk:  6 Minute Walk     Row Name 01/14/23 1550         6 Minute Walk   Phase Initial     Distance 1045 feet     Walk Time 6 minutes     # of Rest Breaks 0     MPH 1.98     METS 2.55     RPE 11     Perceived Dyspnea  0     VO2 Peak 8.94     Symptoms No     Resting HR 92 bpm     Resting BP 118/62     Resting Oxygen Saturation  100 %     Exercise Oxygen Saturation  during 6 min walk 99 %     Max Ex. HR 109 bpm     Max Ex. BP 130/80     2 Minute Post BP 116/74              Oxygen Initial Assessment:   Oxygen Re-Evaluation:   Oxygen Discharge (Final Oxygen Re-Evaluation):   Initial Exercise Prescription:  Initial Exercise Prescription - 01/14/23 1500       Date of Initial Exercise RX and Referring Provider   Date 01/14/23    Referring Provider Elder Negus, MD    Expected Discharge Date 03/26/23      Treadmill   MPH 1.7  Grade 0    Minutes 15    METs 2      NuStep   Level 1    SPM 65    Minutes 15     METs 2      Prescription Details   Frequency (times per week) 3    Duration Progress to 30 minutes of continuous aerobic without signs/symptoms of physical distress      Intensity   THRR 40-80% of Max Heartrate 66-133    Ratings of Perceived Exertion 11-13    Perceived Dyspnea 0-4      Progression   Progression Continue progressive overload as per policy without signs/symptoms or physical distress.      Resistance Training   Training Prescription Yes    Weight 2    Reps 10-15             Perform Capillary Blood Glucose checks as needed.  Exercise Prescription Changes:   Exercise Prescription Changes     Row Name 01/18/23 0928 02/05/23 0800           Response to Exercise   Blood Pressure (Admit) 112/70 114/78      Blood Pressure (Exercise) 134/84 122/70      Blood Pressure (Exit) 102/64 110/72      Heart Rate (Admit) 88 bpm 88 bpm      Heart Rate (Exercise) 115 bpm 115 bpm      Heart Rate (Exit) 96 bpm 95 bpm      Rating of Perceived Exertion (Exercise) 7 11      Perceived Dyspnea (Exercise) 0 0      Symptoms none none      Comments Pt first day in the [facility] ICR program Reviewed METs, goals, home exrx      Duration Progress to 30 minutes of  aerobic without signs/symptoms of physical distress Continue with 30 min of aerobic exercise without signs/symptoms of physical distress.      Intensity THRR unchanged THRR unchanged        Progression   Progression Continue to progress workloads to maintain intensity without signs/symptoms of physical distress. Continue to progress workloads to maintain intensity without signs/symptoms of physical distress.      Average METs 2.2 2.35        Resistance Training   Training Prescription Yes Yes      Weight 2 2      Reps 10-15 10-15      Time 10 Minutes 10 Minutes        Treadmill   MPH 1.7 1.7      Grade 0 0      Minutes 15 15      METs 2.3 2.3        NuStep   Level 1 1      SPM 107 113      Minutes 15 15       METs 2.1 2.4        Home Exercise Plan   Plans to continue exercise at -- Home (comment)  walking, hand weights      Frequency -- Add 3 additional days to program exercise sessions.      Initial Home Exercises Provided -- 02/05/23               Exercise Comments:   Exercise Comments     Row Name 01/18/23 0933 02/05/23 0839         Exercise Comments Pt first day in the [facility] program. Pt  tolerated exercise well with an average MET level of 2.2. Pt is learning her THRR, RPE and ExRx. Off to a great start. Will continue to monitor pt and progress workloads as tolerated without sign or symptom Reviewed METs, goals, and home exrx with pt today. Pt is tolerating exercise well with an avg MET levle of 2.35. Pt feels ready to increase resistance on nustep and will do so next week. Pt feels good about her progress within the program, she has lost 6lbs since starting and stated that she feels stronger than when she began. She excited for the rest of the program nad eager to progress workloads. She has maintained a very positive atitude so far. Pt has already been walking at home an additional 3 day/wk for at time. She also has 5lb wts at home that she uses 2d/wk as well. Discussed with pt how to measure pulse with fingers and provided paper instructions.               Exercise Goals and Review:   Exercise Goals     Row Name 01/14/23 1538             Exercise Goals   Increase Physical Activity Yes       Intervention Provide advice, education, support and counseling about physical activity/exercise needs.;Develop an individualized exercise prescription for aerobic and resistive training based on initial evaluation findings, risk stratification, comorbidities and participant's personal goals.       Expected Outcomes Short Term: Attend rehab on a regular basis to increase amount of physical activity.;Long Term: Exercising regularly at least 3-5 days a week.;Long Term: Add in  home exercise to make exercise part of routine and to increase amount of physical activity.       Increase Strength and Stamina Yes       Intervention Provide advice, education, support and counseling about physical activity/exercise needs.;Develop an individualized exercise prescription for aerobic and resistive training based on initial evaluation findings, risk stratification, comorbidities and participant's personal goals.       Expected Outcomes Short Term: Increase workloads from initial exercise prescription for resistance, speed, and METs.;Short Term: Perform resistance training exercises routinely during rehab and add in resistance training at home;Long Term: Improve cardiorespiratory fitness, muscular endurance and strength as measured by increased METs and functional capacity ( )       Able to understand and use rate of perceived exertion (RPE) scale Yes       Intervention Provide education and explanation on how to use RPE scale       Expected Outcomes Short Term: Able to use RPE daily in rehab to express subjective intensity level;Long Term:  Able to use RPE to guide intensity level when exercising independently       Able to understand and use Dyspnea scale Yes       Intervention Provide education and explanation on how to use Dyspnea scale       Expected Outcomes Short Term: Able to use Dyspnea scale daily in rehab to express subjective sense of shortness of breath during exertion;Long Term: Able to use Dyspnea scale to guide intensity level when exercising independently       Knowledge and understanding of Target Heart Rate Range (THRR) Yes       Intervention Provide education and explanation of THRR including how the numbers were predicted and where they are located for reference       Expected Outcomes Short Term: Able to state/look up THRR;Long Term:  Able to use THRR to govern intensity when exercising independently;Short Term: Able to use daily as guideline for intensity in rehab        Understanding of Exercise Prescription Yes       Intervention Provide education, explanation, and written materials on patient's individual exercise prescription       Expected Outcomes Short Term: Able to explain program exercise prescription;Long Term: Able to explain home exercise prescription to exercise independently                Exercise Goals Re-Evaluation :  Exercise Goals Re-Evaluation     Row Name 01/18/23 0932 02/05/23 0830           Exercise Goal Re-Evaluation   Exercise Goals Review Increase Physical Activity;Understanding of Exercise Prescription;Increase Strength and Stamina;Knowledge and understanding of Target Heart Rate Range (THRR);Able to understand and use rate of perceived exertion (RPE) scale Increase Physical Activity;Understanding of Exercise Prescription;Increase Strength and Stamina;Knowledge and understanding of Target Heart Rate Range (THRR);Able to understand and use rate of perceived exertion (RPE) scale;Able to check pulse independently      Comments Pt first day in the [facility] program. Pt tolerated exercise well with an average MET level of 2.2. Pt is learning her THRR, RPE and ExRx. Off to a great start` Reviewed METs, goals, and home exrx with pt today. Pt is tolerating exercise well with an avg MET levle of 2.35. Pt feels ready to increase resistance on nustep and will do so next week. Pt feels good about her progress within the program, she has lost 6lbs since starting and stated that she feels stronger than when she began. She excited for the rest of the program nad eager to progress workloads. She has maintained a very positive atitude so far. Pt has already been walking at home an additional 3 day/wk for at time. She also has 5lb wts at home that she uses 2d/wk as well. Discussed with pt how to measure pulse with fingers and provided paper instructions.      Expected Outcomes Will continue to monitor pt and progress workloads as tolerated  without sign or symptom Will continue to monitor pt and progress workloads as tolerated without sign or symptom               Discharge Exercise Prescription (Final Exercise Prescription Changes):  Exercise Prescription Changes - 02/05/23 0800       Response to Exercise   Blood Pressure (Admit) 114/78    Blood Pressure (Exercise) 122/70    Blood Pressure (Exit) 110/72    Heart Rate (Admit) 88 bpm    Heart Rate (Exercise) 115 bpm    Heart Rate (Exit) 95 bpm    Rating of Perceived Exertion (Exercise) 11    Perceived Dyspnea (Exercise) 0    Symptoms none    Comments Reviewed METs, goals, home exrx    Duration Continue with 30 min of aerobic exercise without signs/symptoms of physical distress.    Intensity THRR unchanged      Progression   Progression Continue to progress workloads to maintain intensity without signs/symptoms of physical distress.    Average METs 2.35      Resistance Training   Training Prescription Yes    Weight 2    Reps 10-15    Time 10 Minutes      Treadmill   MPH 1.7    Grade 0    Minutes 15    METs 2.3  NuStep   Level 1    SPM 113    Minutes 15    METs 2.4      Home Exercise Plan   Plans to continue exercise at Home (comment)   walking, hand weights   Frequency Add 3 additional days to program exercise sessions.    Initial Home Exercises Provided 02/05/23             Nutrition:  Target Goals: Understanding of nutrition guidelines, daily intake of sodium 1500mg , cholesterol 200mg , calories 30% from fat and 7% or less from saturated fats, daily to have 5 or more servings of fruits and vegetables.  Biometrics:  Pre Biometrics - 01/14/23 1452       Pre Biometrics   Waist Circumference 51.5 inches    Hip Circumference 56 inches    Waist to Hip Ratio 0.92 %    Triceps Skinfold 55 mm    % Body Fat 56.3 %    Grip Strength 32 kg    Flexibility 16.5 in    Single Leg Stand 20.87 seconds              Nutrition Therapy  Plan and Nutrition Goals:  Nutrition Therapy & Goals - 02/15/23 0828       Nutrition Therapy   Diet Heart Healthy/Carbohydrate Consistent    Drug/Food Interactions Statins/Certain Fruits      Personal Nutrition Goals   Nutrition Goal Patient to identify strategies for reducing cardiovascular risk by attending the [facility] education and nutrition series weekly.    Personal Goal #2 Patient to improve diet quality by using the plate method as a guide for meal planning to include lean protein/plant protein, fruits, vegetables, whole grains, nonfat dairy as part of a well-balanced diet.    Personal Goal #3 Patient to reduce sodium intake to 1500mg  per day    Personal Goal #4 Patient to identify strategies for blood sugar control with A1c goal <7%.    Comments Goals in action. [name] attend the [employer] and nutrition series as able (Mondays&Fridays). She has started making many lifestyle changes. She continues ozempic for weight loss and blood sugar control; she is down 7.3# since starting with our program. She will continue to benefit from participation in intensive cardiac rehab for nutrition, exercise, and lifestyle modification.      Intervention Plan   Intervention Prescribe, educate and counsel regarding individualized specific dietary modifications aiming towards targeted core components such as weight, hypertension, lipid management, diabetes, heart failure and other comorbidities.;Nutrition handout(s) given to patient.    Expected Outcomes Short Term Goal: Understand basic principles of dietary content, such as calories, fat, sodium, cholesterol and nutrients.;Long Term Goal: Adherence to prescribed nutrition plan.             Nutrition Assessments:  MEDIFICTS Score Key: ?70 Need to make dietary changes  40-70 Heart Healthy Diet ? 40 Therapeutic Level Cholesterol Diet   Flowsheet Row CARDIAC REHAB PHASE II EXERCISE from 07/22/2022 in [facility]  [facility]  Picture Your Plate Total Score on Admission 71      Picture Your Plate Scores: <45 Unhealthy dietary pattern with much room for improvement. 41-50 Dietary pattern unlikely to meet recommendations for good health and room for improvement. 51-60 More healthful dietary pattern, with some room for improvement.  >60 Healthy dietary pattern, although there may be some specific behaviors that could be improved.    Nutrition Goals Re-Evaluation:  Nutrition Goals Re-Evaluation  Row Name 01/18/23 0857 02/15/23 0828           Goals   Current Weight 233 lb 11 oz (106 kg) 226 lb 6.6 oz (102.7 kg)      Comment lipoproteinA 196.2 taking repatha + statin, lipids WNL, A1c 8.7 prescribed ozempic, metformin No new labs at this time; most recent labs  lipoproteinA 196.2 taking repatha + statin, lipids WNL, A1c 8.7 prescribed ozempic, metformin      Expected Outcome [name] previously started cardiac rehab in fall 2023 but had to stop due to chest pain/readmission. She has started making many lifestyle changes at that time. She will continue to benefit from participation in intensive cardiac rehab for nutrition, exercise, and lifestyle modification. Goals in action. [name] attend the [employer] and nutrition series as able (Mondays&Fridays). She has started making many lifestyle changes. She continues ozempic for weight loss and blood sugar control; she is down 7.3# since starting with our program. She will continue to benefit from participation in intensive cardiac rehab for nutrition, exercise, and lifestyle modification.               Nutrition Goals Re-Evaluation:  Nutrition Goals Re-Evaluation     Row Name 01/18/23 0857 02/15/23 0828           Goals   Current Weight 233 lb 11 oz (106 kg) 226 lb 6.6 oz (102.7 kg)      Comment lipoproteinA 196.2 taking repatha + statin, lipids WNL, A1c 8.7 prescribed ozempic, metformin No new labs at this time;  most recent labs  lipoproteinA 196.2 taking repatha + statin, lipids WNL, A1c 8.7 prescribed ozempic, metformin      Expected Outcome Ermila previously started cardiac rehab in fall 2023 but had to stop due to chest pain/readmission. She has started making many lifestyle changes at that time. She will continue to benefit from participation in intensive cardiac rehab for nutrition, exercise, and lifestyle modification. Goals in action. [name] attend the [employer] and nutrition series as able (Mondays&Fridays). She has started making many lifestyle changes. She continues ozempic for weight loss and blood sugar control; she is down 7.3# since starting with our program. She will continue to benefit from participation in intensive cardiac rehab for nutrition, exercise, and lifestyle modification.               Nutrition Goals Discharge (Final Nutrition Goals Re-Evaluation):  Nutrition Goals Re-Evaluation - 02/15/23 0981       Goals   Current Weight 226 lb 6.6 oz (102.7 kg)    Comment No new labs at this time; most recent labs  lipoproteinA 196.2 taking repatha + statin, lipids WNL, A1c 8.7 prescribed ozempic, metformin    Expected Outcome Goals in action. [name] attend the [employer] and nutrition series as able (Mondays&Fridays). She has started making many lifestyle changes. She continues ozempic for weight loss and blood sugar control; she is down 7.3# since starting with our program. She will continue to benefit from participation in intensive cardiac rehab for nutrition, exercise, and lifestyle modification.             Psychosocial: Target Goals: Acknowledge presence or absence of significant depression and/or stress, maximize coping skills, provide positive support system. Participant is able to verbalize types and ability to use techniques and skills needed for reducing stress and depression.  Initial Review & Psychosocial Screening:  Initial Psych Review &  Screening - 01/14/23 1539       Initial Review  Current issues with None Identified      Family Dynamics   Good Support System? Yes   [name] has her 3 daughters and dog for support     Barriers   Psychosocial barriers to participate in program There are no identifiable barriers or psychosocial needs.      Screening Interventions   Interventions Encouraged to exercise;Provide feedback about the scores to participant    Expected Outcomes Short Term goal: Identification and review with participant of any Quality of Life or Depression concerns found by scoring the questionnaire.;Long Term goal: The participant improves quality of Life and PHQ9 Scores as seen by post scores and/or verbalization of changes             Quality of Life Scores:  Quality of Life - 01/14/23 1618       Quality of Life   Select Quality of Life      Quality of Life Scores   Health/Function Pre 29.2 %    Socioeconomic Pre 27.43 %    Psych/Spiritual Pre 30 %    Family Pre 28.8 %    GLOBAL Pre 28.91 %            Scores of 19 and below usually indicate a poorer quality of life in these areas.  A difference of  2-3 points is a clinically meaningful difference.  A difference of 2-3 points in the total score of the Quality of Life Index has been associated with significant improvement in overall quality of life, self-image, physical symptoms, and general health in studies assessing change in quality of life.  PHQ-9: Review Flowsheet       01/14/2023 07/14/2022 04/02/2022 12/22/2017  Depression screen PHQ 2/9  Decreased Interest 0 0 3 0  Down, Depressed, Hopeless 0 0 0 0  PHQ - 2 Score 0 0 3 0  Altered sleeping 0 - 3 -  Tired, decreased energy 0 - 3 -  Change in appetite 0 - 1 -  Feeling bad or failure about yourself  0 - 0 -  Trouble concentrating 0 - 0 -  Moving slowly or fidgety/restless 0 - 3 -  Suicidal thoughts 0 - 0 -  PHQ-9 Score 0 - 13 -  Difficult doing work/chores - - Very difficult -    Interpretation of Total Score  Total Score Depression Severity:  1-4 = Minimal depression, 5-9 = Mild depression, 10-14 = Moderate depression, 15-19 = Moderately severe depression, 20-27 = Severe depression   Psychosocial Evaluation and Intervention:   Psychosocial Re-Evaluation:  Psychosocial Re-Evaluation     Row Name 02/01/23 1610 02/24/23 1643           Psychosocial Re-Evaluation   Current issues with None Identified None Identified      Interventions Encouraged to attend Cardiac Rehabilitation for the exercise Encouraged to attend Cardiac Rehabilitation for the exercise      Continue Psychosocial Services  No Follow up required No Follow up required               Psychosocial Discharge (Final Psychosocial Re-Evaluation):  Psychosocial Re-Evaluation - 02/24/23 1643       Psychosocial Re-Evaluation   Current issues with None Identified    Interventions Encouraged to attend Cardiac Rehabilitation for the exercise    Continue Psychosocial Services  No Follow up required             Vocational Rehabilitation: Provide vocational rehab assistance to qualifying candidates.   Vocational Rehab Evaluation & Intervention:  Vocational Rehab - 01/14/23 1540       Initial Vocational Rehab Evaluation & Intervention   Assessment shows need for Vocational Rehabilitation No   [name] is unemplyed, however not interested in looking for a job currently            Education: Education Goals: Education classes will be provided on a weekly basis, covering required topics. Participant will state understanding/return demonstration of topics presented.    Education     Row Name 01/18/23 0900     Education   Cardiac Education Topics Pritikin   Select Workshops     Workshops   Educator Exercise Physiologist   Select Exercise   Exercise Workshop Exercise Basics: Building Your Action Plan   Instruction Review Code 1- Verbalizes Understanding   Class Start Time 0815    Class Stop Time 0903   Class Time Calculation (min) 48 min    Row Name 01/22/23 0900     Education   Cardiac Education Topics Pritikin   Select Core Videos     Core Videos   Educator Dietitian   Select Nutrition   Nutrition Calorie Density   Instruction Review Code 1- Verbalizes Understanding   Class Start Time 0815   Class Stop Time 0856   Class Time Calculation (min) 41 min    Row Name 01/29/23 0900     Education   Cardiac Education Topics Pritikin   Select Core Videos     Core Videos   Educator Exercise Physiologist   Select Exercise Education   Exercise Education Move It!   Instruction Review Code 1- Verbalizes Understanding   Class Start Time [phone]   Class Stop Time 0858   Class Time Calculation (min) 42 min    Row Name 02/01/23 1600     Education   Cardiac Education Topics Pritikin   Select Workshops     Workshops   Educator Exercise Physiologist   Select Psychosocial   Psychosocial Workshop Focused Goals, Sustainable Changes   Instruction Review Code 1- Verbalizes Understanding   Class Start Time 0810   Class Stop Time 0855   Class Time Calculation (min) 45 min    Row Name 02/05/23 0900     Education   Cardiac Education Topics Pritikin   Select Core Videos     Core Videos   Educator Exercise Physiologist   Select General Education   General Education Hypertension and Heart Disease   Instruction Review Code 1- Verbalizes Understanding   Class Start Time [phone]   Class Stop Time 8413   Class Time Calculation (min) 40 min    Row Name 02/12/23 0900     Education   Cardiac Education Topics Pritikin   Select Core Videos     Core Videos   Educator Dietitian   Select Nutrition   Nutrition Dining Out - Part 1   Instruction Review Code 1- Verbalizes Understanding   Class Start Time 0815   Class Stop Time 0849   Class Time Calculation (min) 34 min    Row Name 02/22/23 1100     Education   Cardiac Education Topics Pritikin   Select  Workshops     Workshops   Educator Exercise Physiologist   Select Psychosocial   Psychosocial Workshop Healthy Sleep for a Healthy Heart   Instruction Review Code 1- Verbalizes Understanding   Class Start Time 0815   Class Stop Time 0856   Class Time Calculation (min) 41 min  Core Videos: Exercise    Move It!  Clinical staff conducted group or individual video education with verbal and written material and guidebook.  Patient learns the recommended Pritikin exercise program. Exercise with the goal of living a long, healthy life. Some of the health benefits of exercise include controlled diabetes, healthier blood pressure levels, improved cholesterol levels, improved heart and lung capacity, improved sleep, and better body composition. Everyone should speak with their doctor before starting or changing an exercise routine.  Biomechanical Limitations Clinical staff conducted group or individual video education with verbal and written material and guidebook.  Patient learns how biomechanical limitations can impact exercise and how we can mitigate and possibly overcome limitations to have an impactful and balanced exercise routine.  Body Composition Clinical staff conducted group or individual video education with verbal and written material and guidebook.  Patient learns that body composition (ratio of muscle mass to fat mass) is a key component to assessing overall fitness, rather than body weight alone. Increased fat mass, especially visceral belly fat, can put [place] at increased risk for metabolic syndrome, type 2 diabetes, heart disease, and even death. It is recommended to combine diet and exercise (cardiovascular and resistance training) to improve your body composition. Seek guidance from your physician and exercise physiologist before implementing an exercise routine.  Exercise Action Plan Clinical staff conducted group or individual video education with verbal and written  material and guidebook.  Patient learns the recommended strategies to achieve and enjoy long-term exercise adherence, including variety, self-motivation, self-efficacy, and positive decision making. Benefits of exercise include fitness, good health, weight management, more energy, better sleep, less stress, and overall well-being.  Medical   Heart Disease Risk Reduction Clinical staff conducted group or individual video education with verbal and written material and guidebook.  Patient learns our heart is our most vital organ as it circulates oxygen, nutrients, white blood cells, and hormones throughout the entire body, and carries waste away. Data supports a plant-based eating plan like the Pritikin Program for its effectiveness in slowing progression of and reversing heart disease. The video provides a number of recommendations to address heart disease.   Metabolic Syndrome and Belly Fat  Clinical staff conducted group or individual video education with verbal and written material and guidebook.  Patient learns what metabolic syndrome is, how it leads to heart disease, and how one can reverse it and keep it from coming back. You have metabolic syndrome if you have 3 of the following 5 criteria: abdominal obesity, high blood pressure, high triglycerides, low HDL cholesterol, and high blood sugar.  Hypertension and Heart Disease Clinical staff conducted group or individual video education with verbal and written material and guidebook.  Patient learns that high blood pressure, or hypertension, is very common in the [place]. Hypertension is largely due to excessive salt intake, but other important risk factors include being overweight, physical inactivity, drinking too much alcohol, smoking, and not eating enough potassium from fruits and vegetables. High blood pressure is a leading risk factor for heart attack, stroke, congestive heart failure, dementia, kidney failure, and premature death.  Long-term effects of excessive salt intake include stiffening of the arteries and thickening of heart muscle and organ damage. Recommendations include ways to reduce hypertension and the risk of heart disease.  Diseases of Our Time - Focusing on Diabetes Clinical staff conducted group or individual video education with verbal and written material and guidebook.  Patient learns why the best way to stop diseases of our time  is prevention, through food and other lifestyle changes. Medicine (such as prescription pills and surgeries) is often only a Band-Aid on the problem, not a long-term solution. Most common diseases of our time include obesity, type 2 diabetes, hypertension, heart disease, and cancer. The Pritikin Program is recommended and has been proven to help reduce, reverse, and/or prevent the damaging effects of metabolic syndrome.  Nutrition   Overview of the Pritikin Eating Plan  Clinical staff conducted group or individual video education with verbal and written material and guidebook.  Patient learns about the Pritikin Eating Plan for disease risk reduction. The Pritikin Eating Plan emphasizes a wide variety of unrefined, minimally-processed carbohydrates, like fruits, vegetables, whole grains, and legumes. Go, Caution, and Stop food choices are explained. Plant-based and lean animal proteins are emphasized. Rationale provided for low sodium intake for blood pressure control, low added sugars for blood sugar stabilization, and low added fats and oils for coronary artery disease risk reduction and weight management.  Calorie Density  Clinical staff conducted group or individual video education with verbal and written material and guidebook.  Patient learns about calorie density and how it impacts the Pritikin Eating Plan. Knowing the characteristics of the food you choose will help you decide whether those foods will lead to weight gain or weight loss, and whether you want to consume more or  less of them. Weight loss is usually a side effect of the Pritikin Eating Plan because of its focus on low calorie-dense foods.  Label Reading  Clinical staff conducted group or individual video education with verbal and written material and guidebook.  Patient learns about the Pritikin recommended label reading guidelines and corresponding recommendations regarding calorie density, added sugars, sodium content, and whole grains.  Dining Out - Part 1  Clinical staff conducted group or individual video education with verbal and written material and guidebook.  Patient learns that restaurant meals can be sabotaging because they can be so high in calories, fat, sodium, and/or sugar. Patient learns recommended strategies on how to positively address this and avoid unhealthy pitfalls.  Facts on Fats  Clinical staff conducted group or individual video education with verbal and written material and guidebook.  Patient learns that lifestyle modifications can be just as effective, if not more so, as many medications for lowering your risk of heart disease. A Pritikin lifestyle can help to reduce your risk of inflammation and atherosclerosis (cholesterol build-up, or plaque, in the artery walls). Lifestyle interventions such as dietary choices and physical activity address the cause of atherosclerosis. A review of the types of fats and their impact on blood cholesterol levels, along with dietary recommendations to reduce fat intake is also included.  Nutrition Action Plan  Clinical staff conducted group or individual video education with verbal and written material and guidebook.  Patient learns how to incorporate Pritikin recommendations into their lifestyle. Recommendations include planning and keeping personal health goals in mind as an important part of their success.  Healthy Mind-Set    Healthy Minds, Bodies, Hearts  Clinical staff conducted group or individual video education with verbal and written  material and guidebook.  Patient learns how to identify when they are stressed. Video will discuss the impact of that stress, as well as the many benefits of stress management. Patient will also be introduced to stress management techniques. The way we think, act, and feel has an impact on our hearts.  How Our Thoughts Can Heal Our Hearts  Clinical staff conducted group or individual video  education with verbal and written material and guidebook.  Patient learns that negative thoughts can cause depression and anxiety. This can result in negative lifestyle behavior and serious health problems. Cognitive behavioral therapy is an effective method to help control our thoughts in order to change and improve our emotional outlook.  Additional Videos:  Exercise    Improving Performance  Clinical staff conducted group or individual video education with verbal and written material and guidebook.  Patient learns to use a non-linear approach by alternating intensity levels and lengths of time spent exercising to help burn more calories and lose more body fat. Cardiovascular exercise helps improve heart health, metabolism, hormonal balance, blood sugar control, and recovery from fatigue. Resistance training improves strength, endurance, balance, coordination, reaction time, metabolism, and muscle mass. Flexibility exercise improves circulation, posture, and balance. Seek guidance from your physician and exercise physiologist before implementing an exercise routine and learn your capabilities and proper form for all exercise.  Introduction to Yoga  Clinical staff conducted group or individual video education with verbal and written material and guidebook.  Patient learns about yoga, a discipline of the coming together of mind, breath, and body. The benefits of yoga include improved flexibility, improved range of motion, better posture and core strength, increased lung function, weight loss, and positive  self-image. Yoga's heart health benefits include lowered blood pressure, healthier heart rate, decreased cholesterol and triglyceride levels, improved immune function, and reduced stress. Seek guidance from your physician and exercise physiologist before implementing an exercise routine and learn your capabilities and proper form for all exercise.  Medical   Aging: Enhancing Your Quality of Life  Clinical staff conducted group or individual video education with verbal and written material and guidebook.  Patient learns key strategies and recommendations to stay in good physical health and enhance quality of life, such as prevention strategies, having an advocate, securing a Health Care Proxy and Power of Attorney, and keeping a list of medications and system for tracking them. It also discusses how to avoid risk for bone loss.  Biology of Weight Control  Clinical staff conducted group or individual video education with verbal and written material and guidebook.  Patient learns that weight gain occurs because we consume more calories than we burn (eating more, moving less). Even if your body weight is normal, you may have higher ratios of fat compared to muscle mass. Too much body fat puts you at increased risk for cardiovascular disease, heart attack, stroke, type 2 diabetes, and obesity-related cancers. In addition to exercise, following the Pritikin Eating Plan can help reduce your risk.  Decoding Lab Results  Clinical staff conducted group or individual video education with verbal and written material and guidebook.  Patient learns that lab test reflects one measurement whose values change over time and are influenced by many factors, including medication, stress, sleep, exercise, food, hydration, pre-existing medical conditions, and more. It is recommended to use the knowledge from this video to become more involved with your lab results and evaluate your numbers to speak with your  doctor.   Diseases of Our Time - Overview  Clinical staff conducted group or individual video education with verbal and written material and guidebook.  Patient learns that according to the CDC, 50% to 70% of chronic diseases (such as obesity, type 2 diabetes, elevated lipids, hypertension, and heart disease) are avoidable through lifestyle improvements including healthier food choices, listening to satiety cues, and increased physical activity.  Sleep Disorders Clinical staff conducted group or individual  video education with verbal and written material and guidebook.  Patient learns how good quality and duration of sleep are important to overall health and well-being. Patient also learns about sleep disorders and how they impact health along with recommendations to address them, including discussing with a physician.  Nutrition  Dining Out - Part 2 Clinical staff conducted group or individual video education with verbal and written material and guidebook.  Patient learns how to plan ahead and communicate in order to maximize their dining experience in a healthy and nutritious manner. Included are recommended food choices based on the type of restaurant the patient is visiting.   Fueling a Banker conducted group or individual video education with verbal and written material and guidebook.  There is a strong connection between our food choices and our health. Diseases like obesity and type 2 diabetes are very prevalent and are in large-part due to lifestyle choices. The Pritikin Eating Plan provides plenty of food and hunger-curbing satisfaction. It is easy to follow, affordable, and helps reduce health risks.  Menu Workshop  Clinical staff conducted group or individual video education with verbal and written material and guidebook.  Patient learns that restaurant meals can sabotage health goals because they are often packed with calories, fat, sodium, and sugar.  Recommendations include strategies to plan ahead and to communicate with the manager, chef, or server to help order a healthier meal.  Planning Your Eating Strategy  Clinical staff conducted group or individual video education with verbal and written material and guidebook.  Patient learns about the Pritikin Eating Plan and its benefit of reducing the risk of disease. The Pritikin Eating Plan does not focus on calories. Instead, it emphasizes high-quality, nutrient-rich foods. By knowing the characteristics of the foods, we choose, we can determine their calorie density and make informed decisions.  Targeting Your Nutrition Priorities  Clinical staff conducted group or individual video education with verbal and written material and guidebook.  Patient learns that lifestyle habits have a tremendous impact on disease risk and progression. This video provides eating and physical activity recommendations based on your personal health goals, such as reducing LDL cholesterol, losing weight, preventing or controlling type 2 diabetes, and reducing high blood pressure.  Vitamins and Minerals  Clinical staff conducted group or individual video education with verbal and written material and guidebook.  Patient learns different ways to obtain key vitamins and minerals, including through a recommended healthy diet. It is important to discuss all supplements you take with your doctor.   Healthy Mind-Set    Smoking Cessation  Clinical staff conducted group or individual video education with verbal and written material and guidebook.  Patient learns that cigarette smoking and tobacco addiction pose a serious health risk which affects millions of people. Stopping smoking will significantly reduce the risk of heart disease, lung disease, and many forms of cancer. Recommended strategies for quitting are covered, including working with your doctor to develop a successful plan.  Culinary   Becoming a Corporate investment banker conducted group or individual video education with verbal and written material and guidebook.  Patient learns that cooking at home can be healthy, cost-effective, quick, and puts them in control. Keys to cooking healthy recipes will include looking at your recipe, assessing your equipment needs, planning ahead, making it simple, choosing cost-effective seasonal ingredients, and limiting the use of added fats, salts, and sugars.  Cooking - Breakfast and Snacks  Clinical staff conducted group or individual  video education with verbal and written material and guidebook.  Patient learns how important breakfast is to satiety and nutrition through the entire day. Recommendations include key foods to eat during breakfast to help stabilize blood sugar levels and to prevent overeating at meals later in the day. Planning ahead is also a key component.  Cooking - Educational psychologist conducted group or individual video education with verbal and written material and guidebook.  Patient learns eating strategies to improve overall health, including an approach to cook more at home. Recommendations include thinking of animal protein as a side on your plate rather than center stage and focusing instead on lower calorie dense options like vegetables, fruits, whole grains, and plant-based proteins, such as beans. Making sauces in large quantities to freeze for later and leaving the skin on your vegetables are also recommended to maximize your experience.  Cooking - Healthy Salads and Dressing Clinical staff conducted group or individual video education with verbal and written material and guidebook.  Patient learns that vegetables, fruits, whole grains, and legumes are the foundations of the Pritikin Eating Plan. Recommendations include how to incorporate each of these in flavorful and healthy salads, and how to create homemade salad dressings. Proper handling of ingredients is also covered.  Cooking - Soups and [employer] - Soups and Desserts Clinical staff conducted group or individual video education with verbal and written material and guidebook.  Patient learns that Pritikin soups and desserts make for easy, nutritious, and delicious snacks and meal components that are low in sodium, fat, sugar, and calorie density, while high in vitamins, minerals, and filling fiber. Recommendations include simple and healthy ideas for soups and desserts.   Overview     The Pritikin Solution Program Overview Clinical staff conducted group or individual video education with verbal and written material and guidebook.  Patient learns that the results of the Pritikin Program have been documented in more than 100 articles published in peer-reviewed journals, and the benefits include reducing risk factors for (and, in some cases, even reversing) high cholesterol, high blood pressure, type 2 diabetes, obesity, and more! An overview of the three key pillars of the Pritikin Program will be covered: eating well, doing regular exercise, and having a healthy mind-set.  WORKSHOPS  Exercise: Exercise Basics: Building Your Action Plan Clinical staff led group instruction and group discussion with PowerPoint presentation and patient guidebook. To enhance the learning environment the use of posters, models and videos may be added. At the conclusion of this workshop, patients will comprehend the difference between physical activity and exercise, as well as the benefits of incorporating both, into their routine. Patients will understand the FITT (Frequency, Intensity, Time, and Type) principle and how to use it to build an exercise action plan. In addition, safety concerns and other considerations for exercise and cardiac rehab will be addressed by the presenter. The purpose of this lesson is to promote a comprehensive and effective weekly exercise routine in order to improve patients' overall level of  fitness.   Managing Heart Disease: Your Path to a Healthier Heart Clinical staff led group instruction and group discussion with PowerPoint presentation and patient guidebook. To enhance the learning environment the use of posters, models and videos may be added.At the conclusion of this workshop, patients will understand the anatomy and physiology of the heart. Additionally, they will understand how Pritikin's three pillars impact the risk factors, the progression, and the management of heart disease.  The purpose of  this lesson is to provide a high-level overview of the heart, heart disease, and how the Pritikin lifestyle positively impacts risk factors.  Exercise Biomechanics Clinical staff led group instruction and group discussion with PowerPoint presentation and patient guidebook. To enhance the learning environment the use of posters, models and videos may be added. Patients will learn how the structural parts of their bodies function and how these functions impact their daily activities, movement, and exercise. Patients will learn how to promote a neutral spine, learn how to manage pain, and identify ways to improve their physical movement in order to promote healthy living. The purpose of this lesson is to expose patients to common physical limitations that impact physical activity. Participants will learn practical ways to adapt and manage aches and pains, and to minimize their effect on regular exercise. Patients will learn how to maintain good posture while sitting, walking, and lifting.  Balance Training and Fall Prevention  Clinical staff led group instruction and group discussion with PowerPoint presentation and patient guidebook. To enhance the learning environment the use of posters, models and videos may be added. At the conclusion of this workshop, patients will understand the importance of their sensorimotor skills (vision, proprioception, and the vestibular system)  in maintaining their ability to balance as they age. Patients will apply a variety of balancing exercises that are appropriate for their current level of function. Patients will understand the common causes for poor balance, possible solutions to these problems, and ways to modify their physical environment in order to minimize their fall risk. The purpose of this lesson is to teach patients about the importance of maintaining balance as they age and ways to minimize their risk of falling.  WORKSHOPS   Nutrition:  Fueling a Ship broker led group instruction and group discussion with PowerPoint presentation and patient guidebook. To enhance the learning environment the use of posters, models and videos may be added. Patients will review the foundational principles of the Pritikin Eating Plan and understand what constitutes a serving size in each of the food groups. Patients will also learn Pritikin-friendly foods that are better choices when away from home and review make-ahead meal and snack options. Calorie density will be reviewed and applied to three nutrition priorities: weight maintenance, weight loss, and weight gain. The purpose of this lesson is to reinforce (in a group setting) the key concepts around what patients are recommended to eat and how to apply these guidelines when away from home by planning and selecting Pritikin-friendly options. Patients will understand how calorie density may be adjusted for different weight management goals.  Mindful Eating  Clinical staff led group instruction and group discussion with PowerPoint presentation and patient guidebook. To enhance the learning environment the use of posters, models and videos may be added. Patients will briefly review the concepts of the Pritikin Eating Plan and the importance of low-calorie dense foods. The concept of mindful eating will be introduced as well as the importance of paying attention to internal hunger  signals. Triggers for non-hunger eating and techniques for dealing with triggers will be explored. The purpose of this lesson is to provide patients with the opportunity to review the basic principles of the Pritikin Eating Plan, discuss the value of eating mindfully and how to measure internal cues of hunger and fullness using the Hunger Scale. Patients will also discuss reasons for non-hunger eating and learn strategies to use for controlling emotional eating.  Targeting Your Nutrition Priorities Clinical staff led group instruction  and group discussion with PowerPoint presentation and patient guidebook. To enhance the learning environment the use of posters, models and videos may be added. Patients will learn how to determine their genetic susceptibility to disease by reviewing their family history. Patients will gain insight into the importance of diet as part of an overall healthy lifestyle in mitigating the impact of genetics and other environmental insults. The purpose of this lesson is to provide patients with the opportunity to assess their personal nutrition priorities by looking at their family history, their own health history and current risk factors. Patients will also be able to discuss ways of prioritizing and modifying the Pritikin Eating Plan for their highest risk areas  Menu  Clinical staff led group instruction and group discussion with PowerPoint presentation and patient guidebook. To enhance the learning environment the use of posters, models and videos may be added. Using menus brought in from [employer], or printed from [employer], patients will apply the Pritikin dining out guidelines that were presented in the [employer] video. Patients will also be able to practice these guidelines in a variety of provided scenarios. The purpose of this lesson is to provide patients with the opportunity to practice hands-on learning of the Pritikin Dining Out guidelines  with actual menus and practice scenarios.  Label Reading Clinical staff led group instruction and group discussion with PowerPoint presentation and patient guidebook. To enhance the learning environment the use of posters, models and videos may be added. Patients will review and discuss the Pritikin label reading guidelines presented in Pritikin's Label Reading Educational series video. Using fool labels brought in from local grocery stores and markets, patients will apply the label reading guidelines and determine if the packaged food meet the Pritikin guidelines. The purpose of this lesson is to provide patients with the opportunity to review, discuss, and practice hands-on learning of the Pritikin Label Reading guidelines with actual packaged food labels. Cooking School  Pritikin's [employer] are designed to teach patients ways to prepare quick, simple, and affordable recipes at home. The importance of nutrition's role in chronic disease risk reduction is reflected in its emphasis in the overall Pritikin program. By learning how to prepare essential core Pritikin Eating Plan recipes, patients will increase control over what they eat; be able to customize the flavor of foods without the use of added salt, sugar, or fat; and improve the quality of the food they consume. By learning a set of core recipes which are easily assembled, quickly prepared, and affordable, patients are more likely to prepare more healthy foods at home. These workshops focus on convenient breakfasts, simple entres, side dishes, and desserts which can be prepared with minimal effort and are consistent with nutrition recommendations for cardiovascular risk reduction. Cooking [employer] are taught by a Armed forces logistics/support/administrative officer (RD) who has been trained by the [employer]. The chef or RD has a clear understanding of the importance of minimizing - if not completely eliminating - added fat, sugar, and  sodium in recipes. Throughout the series of Cooking School Workshop sessions, patients will learn about healthy ingredients and efficient methods of cooking to build confidence in their capability to prepare    Cooking School weekly topics:  Adding Flavor- Sodium-Free  Fast and Healthy Breakfasts  Powerhouse Plant-Based Proteins  Satisfying Salads and Dressings  Simple Sides and Sauces  International Cuisine-Spotlight on the [employer] Zones  Delicious Desserts  Savory Soups  [employer] - Meals in  a Snap  Tasty Appetizers and Snacks  Comforting Weekend Breakfasts  One-Pot Wonders   Fast Evening Meals  Landscape architect Your Pritikin Plate  WORKSHOPS   Healthy Mindset (Psychosocial):  Focused Goals, Sustainable Changes Clinical staff led group instruction and group discussion with PowerPoint presentation and patient guidebook. To enhance the learning environment the use of posters, models and videos may be added. Patients will be able to apply effective goal setting strategies to establish at least one personal goal, and then take consistent, meaningful action toward that goal. They will learn to identify common barriers to achieving personal goals and develop strategies to overcome them. Patients will also gain an understanding of how our mind-set can impact our ability to achieve goals and the importance of cultivating a positive and growth-oriented mind-set. The purpose of this lesson is to provide patients with a deeper understanding of how to set and achieve personal goals, as well as the tools and strategies needed to overcome common obstacles which may arise along the way.  From Head to Heart: The Power of a Healthy Outlook  Clinical staff led group instruction and group discussion with PowerPoint presentation and patient guidebook. To enhance the learning environment the use of posters, models and videos may be added. Patients will be able to recognize and  describe the impact of emotions and mood on physical health. They will discover the importance of self-care and explore self-care practices which may work for them. Patients will also learn how to utilize the 4 C's to cultivate a healthier outlook and better manage stress and challenges. The purpose of this lesson is to demonstrate to patients how a healthy outlook is an essential part of maintaining good health, especially as they continue their cardiac rehab journey.  Healthy Sleep for a Healthy Heart Clinical staff led group instruction and group discussion with PowerPoint presentation and patient guidebook. To enhance the learning environment the use of posters, models and videos may be added. At the conclusion of this workshop, patients will be able to demonstrate knowledge of the importance of sleep to overall health, well-being, and quality of life. They will understand the symptoms of, and treatments for, common sleep disorders. Patients will also be able to identify daytime and nighttime behaviors which impact sleep, and they will be able to apply these tools to help manage sleep-related challenges. The purpose of this lesson is to provide patients with a general overview of sleep and outline the importance of quality sleep. Patients will learn about a few of the most common sleep disorders. Patients will also be introduced to the concept of "sleep hygiene," and discover ways to self-manage certain sleeping problems through simple daily behavior changes. Finally, the workshop will motivate patients by clarifying the links between quality sleep and their goals of heart-healthy living.   Recognizing and Reducing Stress Clinical staff led group instruction and group discussion with PowerPoint presentation and patient guidebook. To enhance the learning environment the use of posters, models and videos may be added. At the conclusion of this workshop, patients will be able to understand the types of stress  reactions, differentiate between acute and chronic stress, and recognize the impact that chronic stress has on their health. They will also be able to apply different coping mechanisms, such as reframing negative self-talk. Patients will have the opportunity to practice a variety of stress management techniques, such as deep abdominal breathing, progressive muscle relaxation, and/or guided imagery.  The purpose of this lesson is to educate  patients on the role of stress in their lives and to provide healthy techniques for coping with it.  Learning Barriers/Preferences:  Learning Barriers/Preferences - 01/14/23 1540       Learning Barriers/Preferences   Learning Barriers Sight   wears glasses   Learning Preferences Computer/Internet;Group Instruction;Individual Instruction;Pictoral;Skilled Demonstration;Verbal Instruction;Video;Written Material             Education Topics:  Knowledge Questionnaire Score:  Knowledge Questionnaire Score - 01/14/23 1546       Knowledge Questionnaire Score   Pre Score 19/24             Core Components/Risk Factors/Patient Goals at Admission:  Personal Goals and Risk Factors at Admission - 01/14/23 1541       Core Components/Risk Factors/Patient Goals on Admission    Weight Management Yes;Obesity;Weight Loss    Intervention Weight Management: Provide education and appropriate resources to help participant work on and attain dietary goals.;Obesity: Provide education and appropriate resources to help participant work on and attain dietary goals.;Weight Management: Develop a combined nutrition and exercise program designed to reach desired caloric intake, while maintaining appropriate intake of nutrient and fiber, sodium and fats, and appropriate energy expenditure required for the weight goal.;Weight Management/Obesity: Establish reasonable short term and long term weight goals.    Admit Weight 233 lb 11 oz (106 kg)    Goal Weight: Long Term 160 lb  (72.6 kg)   pt personal goal   Expected Outcomes Short Term: Continue to assess and modify interventions until short term weight is achieved;Long Term: Adherence to nutrition and physical activity/exercise program aimed toward attainment of established weight goal;Weight Loss: Understanding of general recommendations for a balanced deficit meal plan, which promotes 1-2 lb weight loss per week and includes a negative energy balance of [phone] kcal/d;Understanding recommendations for meals to include 15-35% energy as protein, 25-35% energy from fat, 35-60% energy from carbohydrates, less than 200mg  of dietary cholesterol, 20-35 gm of total fiber daily;Understanding of distribution of calorie intake throughout the day with the consumption of 4-5 meals/snacks    Intervention Provide education about signs/symptoms and action to take for hypo/hyperglycemia.;Provide education about proper nutrition, including hydration, and aerobic/resistive exercise prescription along with prescribed medications to achieve blood glucose in normal ranges: Fasting glucose 65-99 mg/dL    Expected Outcomes Short Term: Participant verbalizes understanding of the signs/symptoms and immediate care of hyper/hypoglycemia, proper foot care and importance of medication, aerobic/resistive exercise and nutrition plan for blood glucose control.;Long Term: Attainment of HbA1C < 7%.    Hypertension Yes    Intervention Provide education on lifestyle modifcations including regular physical activity/exercise, weight management, moderate sodium restriction and increased consumption of fresh fruit, vegetables, and low fat dairy, alcohol moderation, and smoking cessation.;Monitor prescription use compliance.    Expected Outcomes Short Term: Continued assessment and intervention until BP is < 140/22mm HG in hypertensive participants. < 130/23mm HG in hypertensive participants with diabetes, heart failure or chronic kidney disease.;Long Term: Maintenance  of blood pressure at goal levels.    Lipids Yes    Intervention Provide education and support for participant on nutrition & aerobic/resistive exercise along with prescribed medications to achieve LDL 70mg , HDL >40mg .    Expected Outcomes Short Term: Participant states understanding of desired cholesterol values and is compliant with medications prescribed. Participant is following exercise prescription and nutrition guidelines.;Long Term: Cholesterol controlled with medications as prescribed, with individualized exercise RX and with personalized nutrition plan. Value goals: LDL < 70mg , HDL > 40 mg.  Core Components/Risk Factors/Patient Goals Review:   Goals and Risk Factor Review     Row Name 02/01/23 1610 02/24/23 1644           Core Components/Risk Factors/Patient Goals Review   Personal Goals Review Weight Management/Obesity;Hypertension;Lipids;Diabetes Weight Management/Obesity;Hypertension;Lipids;Diabetes      Review [name] is off to a good start to exercise at traditional cardiac rehab. Vital signs and CBG's have been stable. [name] has lost 3.6 kg since starting the program [name] is doing well with  exercise at traditional cardiac rehab. Vital signs and CBG's have been stable. [name] has lost 3.4 kg since starting the program      Expected Outcomes [name] will continue to participate in traditional cardiac rehab for exercise, nutrition and lifestyle modifications [name] will continue to participate in traditional cardiac rehab for exercise, nutrition and lifestyle modifications               Core Components/Risk Factors/Patient Goals at Discharge (Final Review):   Goals and Risk Factor Review - 02/24/23 1644       Core Components/Risk Factors/Patient Goals Review   Personal Goals Review Weight Management/Obesity;Hypertension;Lipids;Diabetes    Review [name] is doing well with  exercise at traditional cardiac rehab. Vital signs and CBG's have been  stable. [name] has lost 3.4 kg since starting the program    Expected Outcomes [name] will continue to participate in traditional cardiac rehab for exercise, nutrition and lifestyle modifications             ITP Comments:  ITP Comments     Row Name 01/14/23 1449 02/01/23 0831 02/24/23 1642       ITP Comments Dr. Armanda Magic medical director. Introduction to pritikin education/ intensive cardiac rehab. Initial orientation packet reveiwed with patient. 30 Day ITP comments. Sarah is off to a good start to exercise at traditional cardiac rehab 30 Day ITP comments. Zakariya has good participation and attendance with  exercise at traditional cardiac rehab              Comments: See ITP comments.Thayer Headings RN BSN

## 2023-02-26 ENCOUNTER — Encounter (HOSPITAL_COMMUNITY)
Admission: RE | Admit: 2023-02-26 | Discharge: 2023-02-26 | Disposition: A | Discharge status: Home / Self Care | Payer: Medicaid Other | Source: Ambulatory Visit | Attending: Cardiology | Admitting: Cardiology

## 2023-02-26 DIAGNOSIS — Z955 Presence of coronary angioplasty implant and graft: Secondary | ICD-10-CM

## 2023-03-01 ENCOUNTER — Encounter (HOSPITAL_COMMUNITY)
Admission: RE | Admit: 2023-03-01 | Discharge: 2023-03-01 | Disposition: A | Discharge status: Home / Self Care | Payer: Medicaid Other | Source: Ambulatory Visit | Attending: Cardiology | Admitting: Cardiology

## 2023-03-01 DIAGNOSIS — Z955 Presence of coronary angioplasty implant and graft: Secondary | ICD-10-CM | POA: Diagnosis not present

## 2023-03-01 DIAGNOSIS — Z9861 Coronary angioplasty status: Secondary | ICD-10-CM

## 2023-03-03 ENCOUNTER — Encounter (HOSPITAL_COMMUNITY): Payer: Medicaid Other

## 2023-03-04 ENCOUNTER — Other Ambulatory Visit (HOSPITAL_COMMUNITY): Payer: Self-pay

## 2023-03-05 ENCOUNTER — Encounter (HOSPITAL_COMMUNITY)
Admission: RE | Admit: 2023-03-05 | Discharge: 2023-03-05 | Disposition: A | Discharge status: Home / Self Care | Payer: Medicaid Other | Source: Ambulatory Visit | Attending: Cardiology | Admitting: Cardiology

## 2023-03-05 DIAGNOSIS — Z9861 Coronary angioplasty status: Secondary | ICD-10-CM

## 2023-03-05 DIAGNOSIS — Z955 Presence of coronary angioplasty implant and graft: Secondary | ICD-10-CM | POA: Diagnosis not present

## 2023-03-08 ENCOUNTER — Encounter (HOSPITAL_COMMUNITY)
Admission: RE | Admit: 2023-03-08 | Discharge: 2023-03-08 | Disposition: A | Discharge status: Home / Self Care | Payer: Medicaid Other | Source: Ambulatory Visit | Attending: Cardiology | Admitting: Cardiology

## 2023-03-08 DIAGNOSIS — Z955 Presence of coronary angioplasty implant and graft: Secondary | ICD-10-CM | POA: Diagnosis not present

## 2023-03-08 DIAGNOSIS — Z9861 Coronary angioplasty status: Secondary | ICD-10-CM

## 2023-03-10 ENCOUNTER — Encounter (HOSPITAL_COMMUNITY): Payer: Medicaid Other

## 2023-03-12 ENCOUNTER — Encounter (HOSPITAL_COMMUNITY)
Admission: RE | Admit: 2023-03-12 | Discharge: 2023-03-12 | Disposition: A | Discharge status: Home / Self Care | Payer: Medicaid Other | Source: Ambulatory Visit | Attending: Cardiology | Admitting: Cardiology

## 2023-03-12 DIAGNOSIS — Z955 Presence of coronary angioplasty implant and graft: Secondary | ICD-10-CM

## 2023-03-12 DIAGNOSIS — Z9861 Coronary angioplasty status: Secondary | ICD-10-CM

## 2023-03-16 DIAGNOSIS — M5412 Radiculopathy, cervical region: Secondary | ICD-10-CM | POA: Insufficient documentation

## 2023-03-17 ENCOUNTER — Encounter (HOSPITAL_COMMUNITY): Payer: Medicaid Other

## 2023-03-19 ENCOUNTER — Encounter (HOSPITAL_COMMUNITY): Payer: Medicaid Other

## 2023-03-22 ENCOUNTER — Encounter (HOSPITAL_COMMUNITY)
Admission: RE | Admit: 2023-03-22 | Discharge: 2023-03-22 | Disposition: A | Discharge status: Home / Self Care | Payer: Medicaid Other | Source: Ambulatory Visit | Attending: Cardiology | Admitting: Cardiology

## 2023-03-22 DIAGNOSIS — Z955 Presence of coronary angioplasty implant and graft: Secondary | ICD-10-CM

## 2023-03-22 DIAGNOSIS — Z9861 Coronary angioplasty status: Secondary | ICD-10-CM

## 2023-03-22 NOTE — Progress Notes (Signed)
Cardiac Individual Treatment Plan  Patient Details  Name: [name] MRN: [id number] Date of Birth: 1966/07/01 Referring Provider:   Flowsheet Row CARDIAC REHAB PHASE II ORIENTATION from 01/14/2023 in [facility]  Referring Provider Patwardhan, Anabel Bene, MD       Initial Encounter Date:  Flowsheet Row CARDIAC REHAB PHASE II ORIENTATION from 01/14/2023 in [facility]  Date 01/14/23       Visit Diagnosis: S/P right coronary artery (RCA) stent placement  Post PTCA  05/26/22 PTCA OM 1  Patient's Home Medications on Admission:  Current Outpatient Medications:    acetaminophen (TYLENOL) 325 MG tablet, Take 650 mg by mouth every 6 (six) hours as needed (pain.)., Disp: , Rfl:    amLODipine (NORVASC) 10 MG tablet, TAKE 1 TABLET(10 MG) BY MOUTH EVERY EVENING, Disp: 90 tablet, Rfl: 0   aspirin 81 MG chewable tablet, Chew 81 mg by mouth in the morning., Disp: , Rfl:    atorvastatin (LIPITOR) 40 MG tablet, Take 1 tablet (40 mg total) by mouth every evening., Disp: 90 tablet, Rfl: 3   empagliflozin (JARDIANCE) 10 MG TABS tablet, Take 1 tablet (10 mg total) by mouth daily., Disp: 90 tablet, Rfl: 1   Ferrous Sulfate (IRON) 325 (65 FE) MG TABS, Take 325 mg by mouth 2 (two) times daily., Disp: , Rfl:    furosemide (LASIX) 40 MG tablet, Take 40 mg by mouth as needed., Disp: , Rfl:    isosorbide mononitrate (IMDUR) 30 MG 24 hr tablet, Take 1 tablet (30 mg total) by mouth daily., Disp: 90 tablet, Rfl: 1   losartan (COZAAR) 25 MG tablet, Take 1 tablet (25 mg total) by mouth daily., Disp: 90 tablet, Rfl: 1   metFORMIN (GLUCOPHAGE) 1000 MG tablet, Take 500 mg by mouth 2 (two) times daily with a meal., Disp: , Rfl:    metoprolol succinate (TOPROL-XL) 100 MG 24 hr tablet, Take 1 tablet (100 mg total) by mouth daily. Take with or immediately following a meal., Disp: 90 tablet, Rfl: 1    nitroGLYCERIN (NITROSTAT) 0.4 MG SL tablet, Place 1 tablet (0.4 mg total) under the tongue every 5 (five) minutes as needed for chest pain., Disp: 30 tablet, Rfl: 0   ranolazine (RANEXA) 1000 MG SR tablet, Take 1 tablet (1,000 mg total) by mouth 2 (two) times daily., Disp: 60 tablet, Rfl: 1   REPATHA SURECLICK 140 MG/ML SOAJ, ADMINISTER 1 ML UNDER THE SKIN EVERY 14 DAYS, Disp: 2 mL, Rfl: 5   Semaglutide (OZEMPIC, 0.25 OR 0.5 MG/DOSE, [place]), Inject 0.5 mg into the skin every Sunday., Disp: , Rfl:    ticagrelor (BRILINTA) 90 MG TABS tablet, Take 1 tablet (90 mg total) by mouth 2 (two) times daily., Disp: 60 tablet, Rfl: 6  Past Medical History: Past Medical History:  Diagnosis Date   Anemia    CAD (coronary artery disease)    PCI with stent to RCA in 2019   CHF (congestive heart failure) (HCC) 04/2011   EF 15-20% from echo 05/19/11   DOE (dyspnea on exertion)    DVT (deep venous thrombosis) (HCC) 06/2011   LLE   Fatigue    HTN (hypertension)    Hyperlipidemia    Menorrhagia    with iron deficient anemia   NSTEMI (non-ST elevated myocardial infarction) (HCC) 10/26/2017   NSTEMI (non-ST elevated myocardial infarction) (HCC) 10/27/2017   Obesity    Orthopnea  Type II diabetes mellitus (HCC)     Tobacco Use: Social History   Tobacco Use  Smoking Status Former   Packs/day: 0.50   Years: 10.00   Additional pack years: 0.00   Total pack years: 5.00   Types: Cigarettes   Quit date: 10/19/1993   Years since quitting: 29.4  Smokeless Tobacco Never    Labs: Review Flowsheet  More data exists      Latest Ref Rng & Units 11/17/2019 03/10/2022 05/27/2022 07/27/2022 08/27/2022  Labs for ITP Cardiac and Pulmonary Rehab  Cholestrol 100 - 199 mg/dL 478  - 295  621  87   LDL (calc) 0 - 99 mg/dL 62  - 94  58  30   Direct LDL 0 - 99 mg/dL - - - 67  -  HDL-C >30 mg/dL 34  - 35  33  42   Trlycerides 0 - 149 mg/dL 51  - 69  71  68   Hemoglobin A1c 4.8 - 5.6 % - 8.7  - - -    Capillary Blood  Glucose: Lab Results  Component Value Date   GLUCAP 150 (H) 02/01/2023   GLUCAP 172 (H) 01/22/2023   GLUCAP 168 (H) 01/22/2023   GLUCAP 131 (H) 01/18/2023   GLUCAP 155 (H) 01/18/2023     Exercise Target Goals: Exercise Program Goal: Individual exercise prescription set using results from initial 6 min walk test and THRR while considering  patient's activity barriers and safety.   Exercise Prescription Goal: Initial exercise prescription builds to 30-45 minutes a day of aerobic activity, 2-3 days per week.  Home exercise guidelines will be given to patient during program as part of exercise prescription that the participant will acknowledge.  Activity Barriers & Risk Stratification:  Activity Barriers & Cardiac Risk Stratification - 01/14/23 1537       Activity Barriers & Cardiac Risk Stratification   Activity Barriers Shortness of Breath   with hills   Cardiac Risk Stratification High   <5 METs on            6 Minute Walk:  6 Minute Walk     Row Name 01/14/23 1550         6 Minute Walk   Phase Initial     Distance 1045 feet     Walk Time 6 minutes     # of Rest Breaks 0     MPH 1.98     METS 2.55     RPE 11     Perceived Dyspnea  0     VO2 Peak 8.94     Symptoms No     Resting HR 92 bpm     Resting BP 118/62     Resting Oxygen Saturation  100 %     Exercise Oxygen Saturation  during 6 min walk 99 %     Max Ex. HR 109 bpm     Max Ex. BP 130/80     2 Minute Post BP 116/74              Oxygen Initial Assessment:   Oxygen Re-Evaluation:   Oxygen Discharge (Final Oxygen Re-Evaluation):   Initial Exercise Prescription:  Initial Exercise Prescription - 01/14/23 1500       Date of Initial Exercise RX and Referring Provider   Date 01/14/23    Referring Provider Elder Negus, MD    Expected Discharge Date 03/26/23      Treadmill   MPH 1.7  Grade 0    Minutes 15    METs 2      NuStep   Level 1    SPM 65    Minutes 15     METs 2      Prescription Details   Frequency (times per week) 3    Duration Progress to 30 minutes of continuous aerobic without signs/symptoms of physical distress      Intensity   THRR 40-80% of Max Heartrate 66-133    Ratings of Perceived Exertion 11-13    Perceived Dyspnea 0-4      Progression   Progression Continue progressive overload as per policy without signs/symptoms or physical distress.      Resistance Training   Training Prescription Yes    Weight 2    Reps 10-15             Perform Capillary Blood Glucose checks as needed.  Exercise Prescription Changes:   Exercise Prescription Changes     Row Name 01/18/23 0928 02/05/23 0800 02/26/23 0830 03/12/23 0904       Response to Exercise   Blood Pressure (Admit) 112/70 114/78 110/78 144/84    Blood Pressure (Exercise) 134/84 122/70 138/86 112/80    Blood Pressure (Exit) 102/64 110/72 100/70 104/70    Heart Rate (Admit) 88 bpm 88 bpm 86 bpm 95 bpm    Heart Rate (Exercise) 115 bpm 115 bpm 118 bpm 120 bpm    Heart Rate (Exit) 96 bpm 95 bpm 95 bpm 80 bpm    Rating of Perceived Exertion (Exercise) 7 11 11.5 10    Perceived Dyspnea (Exercise) 0 0 0 0    Symptoms none none none chronic shoulder pain 7/10    Comments Pt first day in the [facility] program Reviewed METs, goals, home exrx Reviewed MET's and goals Reviewed MET's    Duration Progress to 30 minutes of  aerobic without signs/symptoms of physical distress Continue with 30 min of aerobic exercise without signs/symptoms of physical distress. Continue with 30 min of aerobic exercise without signs/symptoms of physical distress. Continue with 30 min of aerobic exercise without signs/symptoms of physical distress.    Intensity THRR unchanged THRR unchanged THRR unchanged THRR unchanged      Progression   Progression Continue to progress workloads to maintain intensity without signs/symptoms of physical distress. Continue to progress workloads to maintain intensity  without signs/symptoms of physical distress. Continue to progress workloads to maintain intensity without signs/symptoms of physical distress. Continue to progress workloads to maintain intensity without signs/symptoms of physical distress.    Average METs 2.2 2.35 2.47 2.76      Resistance Training   Training Prescription Yes Yes Yes No  Shoulder pain today, no wt's    Weight 2 2 2  --    Reps 10-15 10-15 10-15 --    Time 10 Minutes 10 Minutes 10 Minutes --      Treadmill   MPH 1.7 1.7 2 2.5    Grade 0 0 0 0    Minutes 15 15 15 15     METs 2.3 2.3 2.53 2.91      NuStep   Level 1 1 3 3     SPM 107 113 131 107    Minutes 15 15 15 15     METs 2.1 2.4 2.4 2.6      Home Exercise Plan   Plans to continue exercise at -- Home (comment)  walking, hand weights Home (comment)  walking, hand weights Home (comment)  walking, hand weights    Frequency -- Add 3 additional days to program exercise sessions. Add 3 additional days to program exercise sessions. Add 3 additional days to program exercise sessions.    Initial Home Exercises Provided -- 02/05/23 02/05/23 02/05/23             Exercise Comments:   Exercise Comments     Row Name 01/18/23 0933 02/05/23 0839 02/26/23 0830 03/12/23 0908     Exercise Comments Pt first day in the [facility] program. Pt tolerated exercise well with an average MET level of 2.2. Pt is learning her THRR, RPE and ExRx. Off to a great start. Will continue to monitor pt and progress workloads as tolerated without sign or symptom Reviewed METs, goals, and home exrx with pt today. Pt is tolerating exercise well with an avg MET levle of 2.35. Pt feels ready to increase resistance on nustep and will do so next week. Pt feels good about her progress within the program, she has lost 6lbs since starting and stated that she feels stronger than when she began. She excited for the rest of the program nad eager to progress workloads. She has maintained a very positive atitude  so far. Pt has already been walking at home an additional 3 day/wk for at time. She also has 5lb wts at home that she uses 2d/wk as well. Discussed with pt how to measure pulse with fingers and provided paper instructions. Reviewed METs and goals. Pt is tolerating exercise well with an avg MET levle of 2.47. Pt feels good about her progress so far and is increasing her MET's, and strength and stamina Reviewed METs. Pt is tolerating exercise well with an avg MET levle of 2.76. Pt feels excited about her progress so far and states she is feeling better and is able to go down in her clothing size.             Exercise Goals and Review:   Exercise Goals     Row Name 01/14/23 1538             Exercise Goals   Increase Physical Activity Yes       Intervention Provide advice, education, support and counseling about physical activity/exercise needs.;Develop an individualized exercise prescription for aerobic and resistive training based on initial evaluation findings, risk stratification, comorbidities and participant's personal goals.       Expected Outcomes Short Term: Attend rehab on a regular basis to increase amount of physical activity.;Long Term: Exercising regularly at least 3-5 days a week.;Long Term: Add in home exercise to make exercise part of routine and to increase amount of physical activity.       Increase Strength and Stamina Yes       Intervention Provide advice, education, support and counseling about physical activity/exercise needs.;Develop an individualized exercise prescription for aerobic and resistive training based on initial evaluation findings, risk stratification, comorbidities and participant's personal goals.       Expected Outcomes Short Term: Increase workloads from initial exercise prescription for resistance, speed, and METs.;Short Term: Perform resistance training exercises routinely during rehab and add in resistance training at home;Long Term: Improve  cardiorespiratory fitness, muscular endurance and strength as measured by increased METs and functional capacity ( )       Able to understand and use rate of perceived exertion (RPE) scale Yes       Intervention Provide education and explanation on how to use RPE scale  Expected Outcomes Short Term: Able to use RPE daily in rehab to express subjective intensity level;Long Term:  Able to use RPE to guide intensity level when exercising independently       Able to understand and use Dyspnea scale Yes       Intervention Provide education and explanation on how to use Dyspnea scale       Expected Outcomes Short Term: Able to use Dyspnea scale daily in rehab to express subjective sense of shortness of breath during exertion;Long Term: Able to use Dyspnea scale to guide intensity level when exercising independently       Knowledge and understanding of Target Heart Rate Range (THRR) Yes       Intervention Provide education and explanation of THRR including how the numbers were predicted and where they are located for reference       Expected Outcomes Short Term: Able to state/look up THRR;Long Term: Able to use THRR to govern intensity when exercising independently;Short Term: Able to use daily as guideline for intensity in rehab       Understanding of Exercise Prescription Yes       Intervention Provide education, explanation, and written materials on patient's individual exercise prescription       Expected Outcomes Short Term: Able to explain program exercise prescription;Long Term: Able to explain home exercise prescription to exercise independently                Exercise Goals Re-Evaluation :  Exercise Goals Re-Evaluation     Row Name 01/18/23 0932 02/05/23 0830 02/26/23 0830         Exercise Goal Re-Evaluation   Exercise Goals Review Increase Physical Activity;Understanding of Exercise Prescription;Increase Strength and Stamina;Knowledge and understanding of Target Heart Rate  Range (THRR);Able to understand and use rate of perceived exertion (RPE) scale Increase Physical Activity;Understanding of Exercise Prescription;Increase Strength and Stamina;Knowledge and understanding of Target Heart Rate Range (THRR);Able to understand and use rate of perceived exertion (RPE) scale;Able to check pulse independently Increase Physical Activity;Understanding of Exercise Prescription;Increase Strength and Stamina;Knowledge and understanding of Target Heart Rate Range (THRR);Able to understand and use rate of perceived exertion (RPE) scale;Able to check pulse independently     Comments Pt first day in the [facility] program. Pt tolerated exercise well with an average MET level of 2.2. Pt is learning her THRR, RPE and ExRx. Off to a great start` Reviewed METs, goals, and home exrx with pt today. Pt is tolerating exercise well with an avg MET levle of 2.35. Pt feels ready to increase resistance on nustep and will do so next week. Pt feels good about her progress within the program, she has lost 6lbs since starting and stated that she feels stronger than when she began. She excited for the rest of the program nad eager to progress workloads. She has maintained a very positive atitude so far. Pt has already been walking at home an additional 3 day/wk for at time. She also has 5lb wts at home that she uses 2d/wk as well. Discussed with pt how to measure pulse with fingers and provided paper instructions. Reviewed METs and goals. Pt is tolerating exercise well with an avg MET levle of 2.47. Pt feels good about her progress so far and is increasing her MET's, and strength and stamina     Expected Outcomes Will continue to monitor pt and progress workloads as tolerated without sign or symptom Will continue to monitor pt and progress workloads as tolerated without  sign or symptom Will continue to monitor pt and progress workloads as tolerated without sign or symptom              Discharge  Exercise Prescription (Final Exercise Prescription Changes):  Exercise Prescription Changes - 03/12/23 0904       Response to Exercise   Blood Pressure (Admit) 144/84    Blood Pressure (Exercise) 112/80    Blood Pressure (Exit) 104/70    Heart Rate (Admit) 95 bpm    Heart Rate (Exercise) 120 bpm    Heart Rate (Exit) 80 bpm    Rating of Perceived Exertion (Exercise) 10    Perceived Dyspnea (Exercise) 0    Symptoms chronic shoulder pain 7/10    Comments Reviewed MET's    Duration Continue with 30 min of aerobic exercise without signs/symptoms of physical distress.    Intensity THRR unchanged      Progression   Progression Continue to progress workloads to maintain intensity without signs/symptoms of physical distress.    Average METs 2.76      Resistance Training   Training Prescription No   Shoulder pain today, no wt's     Treadmill   MPH 2.5    Grade 0    Minutes 15    METs 2.91      NuStep   Level 3    SPM 107    Minutes 15    METs 2.6      Home Exercise Plan   Plans to continue exercise at Home (comment)   walking, hand weights   Frequency Add 3 additional days to program exercise sessions.    Initial Home Exercises Provided 02/05/23             Nutrition:  Target Goals: Understanding of nutrition guidelines, daily intake of sodium 1500mg , cholesterol 200mg , calories 30% from fat and 7% or less from saturated fats, daily to have 5 or more servings of fruits and vegetables.  Biometrics:  Pre Biometrics - 01/14/23 1452       Pre Biometrics   Waist Circumference 51.5 inches    Hip Circumference 56 inches    Waist to Hip Ratio 0.92 %    Triceps Skinfold 55 mm    % Body Fat 56.3 %    Grip Strength 32 kg    Flexibility 16.5 in    Single Leg Stand 20.87 seconds              Nutrition Therapy Plan and Nutrition Goals:  Nutrition Therapy & Goals - 03/19/23 0852       Nutrition Therapy   Diet Heart Healthy/Carbohydrate Consistent    Drug/Food  Interactions Statins/Certain Fruits      Personal Nutrition Goals   Nutrition Goal Patient to identify strategies for reducing cardiovascular risk by attending the [facility] education and nutrition series weekly.    Personal Goal #2 Patient to improve diet quality by using the plate method as a guide for meal planning to include lean protein/plant protein, fruits, vegetables, whole grains, nonfat dairy as part of a well-balanced diet.    Personal Goal #3 Patient to reduce sodium intake to 1500mg  per day    Personal Goal #4 Patient to identify strategies for blood sugar control with A1c goal <7%.    Comments Goals in action. Malashia attend the [employer] and nutrition series as able (Mondays&Fridays); she has attended 4 sessions at this time. She has started making many lifestyle changes. She continues ozempic for weight loss and blood  sugar control; she is down 8.6# since starting with our program. She will continue to benefit from participation in intensive cardiac rehab for nutrition, exercise, and lifestyle modification.      Intervention Plan   Intervention Prescribe, educate and counsel regarding individualized specific dietary modifications aiming towards targeted core components such as weight, hypertension, lipid management, diabetes, heart failure and other comorbidities.;Nutrition handout(s) given to patient.    Expected Outcomes Short Term Goal: Understand basic principles of dietary content, such as calories, fat, sodium, cholesterol and nutrients.;Long Term Goal: Adherence to prescribed nutrition plan.             Nutrition Assessments:  MEDIFICTS Score Key: ?70 Need to make dietary changes  40-70 Heart Healthy Diet ? 40 Therapeutic Level Cholesterol Diet   Flowsheet Row CARDIAC REHAB PHASE II EXERCISE from 07/22/2022 in [facility]  Picture Your Plate Total Score on Admission 71      Picture Your Plate  Scores: <60 Unhealthy dietary pattern with much room for improvement. 41-50 Dietary pattern unlikely to meet recommendations for good health and room for improvement. 51-60 More healthful dietary pattern, with some room for improvement.  >60 Healthy dietary pattern, although there may be some specific behaviors that could be improved.    Nutrition Goals Re-Evaluation:  Nutrition Goals Re-Evaluation     Row Name 01/18/23 0857 02/15/23 0828 03/19/23 0852         Goals   Current Weight 233 lb 11 oz (106 kg) 226 lb 6.6 oz (102.7 kg) 225 lb 1.4 oz (102.1 kg)     Comment lipoproteinA 196.2 taking repatha + statin, lipids WNL, A1c 8.7 prescribed ozempic, metformin No new labs at this time; most recent labs  lipoproteinA 196.2 taking repatha + statin, lipids WNL, A1c 8.7 prescribed ozempic, metformin No new labs at this time; most recent labs lipoproteinA 196.2 taking repatha + statin, lipids WNL, A1c 8.7 prescribed ozempic, metformin     Expected Outcome Jannifer previously started cardiac rehab in fall 2023 but had to stop due to chest pain/readmission. She has started making many lifestyle changes at that time. She will continue to benefit from participation in intensive cardiac rehab for nutrition, exercise, and lifestyle modification. Goals in action. Emer attend the [employer] and nutrition series as able (Mondays&Fridays). She has started making many lifestyle changes. She continues ozempic for weight loss and blood sugar control; she is down 7.3# since starting with our program. She will continue to benefit from participation in intensive cardiac rehab for nutrition, exercise, and lifestyle modification. Goals in action. Margerite attend the [employer] and nutrition series as able (Mondays&Fridays); she has attended 4 sessions at this time. She has started making many lifestyle changes. She continues ozempic for weight loss and blood sugar control; she is down 8.6# since starting  with our program. She will continue to benefit from participation in intensive cardiac rehab for nutrition, exercise, and lifestyle modification.              Nutrition Goals Re-Evaluation:  Nutrition Goals Re-Evaluation     Row Name 01/18/23 0857 02/15/23 0828 03/19/23 0852         Goals   Current Weight 233 lb 11 oz (106 kg) 226 lb 6.6 oz (102.7 kg) 225 lb 1.4 oz (102.1 kg)     Comment lipoproteinA 196.2 taking repatha + statin, lipids WNL, A1c 8.7 prescribed ozempic, metformin No new labs at this time; most recent  labs  lipoproteinA 196.2 taking repatha + statin, lipids WNL, A1c 8.7 prescribed ozempic, metformin No new labs at this time; most recent labs lipoproteinA 196.2 taking repatha + statin, lipids WNL, A1c 8.7 prescribed ozempic, metformin     Expected Outcome Reauna previously started cardiac rehab in fall 2023 but had to stop due to chest pain/readmission. She has started making many lifestyle changes at that time. She will continue to benefit from participation in intensive cardiac rehab for nutrition, exercise, and lifestyle modification. Goals in action. Pelagia attend the [employer] and nutrition series as able (Mondays&Fridays). She has started making many lifestyle changes. She continues ozempic for weight loss and blood sugar control; she is down 7.3# since starting with our program. She will continue to benefit from participation in intensive cardiac rehab for nutrition, exercise, and lifestyle modification. Goals in action. Gisselle attend the [employer] and nutrition series as able (Mondays&Fridays); she has attended 4 sessions at this time. She has started making many lifestyle changes. She continues ozempic for weight loss and blood sugar control; she is down 8.6# since starting with our program. She will continue to benefit from participation in intensive cardiac rehab for nutrition, exercise, and lifestyle modification.              Nutrition Goals  Discharge (Final Nutrition Goals Re-Evaluation):  Nutrition Goals Re-Evaluation - 03/19/23 1610       Goals   Current Weight 225 lb 1.4 oz (102.1 kg)    Comment No new labs at this time; most recent labs lipoproteinA 196.2 taking repatha + statin, lipids WNL, A1c 8.7 prescribed ozempic, metformin    Expected Outcome Goals in action. Lizelle attend the [employer] and nutrition series as able (Mondays&Fridays); she has attended 4 sessions at this time. She has started making many lifestyle changes. She continues ozempic for weight loss and blood sugar control; she is down 8.6# since starting with our program. She will continue to benefit from participation in intensive cardiac rehab for nutrition, exercise, and lifestyle modification.             Psychosocial: Target Goals: Acknowledge presence or absence of significant depression and/or stress, maximize coping skills, provide positive support system. Participant is able to verbalize types and ability to use techniques and skills needed for reducing stress and depression.  Initial Review & Psychosocial Screening:  Initial Psych Review & Screening - 01/14/23 1539       Initial Review   Current issues with None Identified      Family Dynamics   Good Support System? Yes   Adel has her 3 daughters and dog for support     Barriers   Psychosocial barriers to participate in program There are no identifiable barriers or psychosocial needs.      Screening Interventions   Interventions Encouraged to exercise;Provide feedback about the scores to participant    Expected Outcomes Short Term goal: Identification and review with participant of any Quality of Life or Depression concerns found by scoring the questionnaire.;Long Term goal: The participant improves quality of Life and PHQ9 Scores as seen by post scores and/or verbalization of changes             Quality of Life Scores:  Quality of Life - 01/14/23 1618       Quality  of Life   Select Quality of Life      Quality of Life Scores   Health/Function Pre 29.2 %    Socioeconomic Pre 27.43 %  Psych/Spiritual Pre 30 %    Family Pre 28.8 %    GLOBAL Pre 28.91 %            Scores of 19 and below usually indicate a poorer quality of life in these areas.  A difference of  2-3 points is a clinically meaningful difference.  A difference of 2-3 points in the total score of the Quality of Life Index has been associated with significant improvement in overall quality of life, self-image, physical symptoms, and general health in studies assessing change in quality of life.  PHQ-9: Review Flowsheet       01/14/2023 07/14/2022 04/02/2022 12/22/2017  Depression screen PHQ 2/9  Decreased Interest 0 0 3 0  Down, Depressed, Hopeless 0 0 0 0  PHQ - 2 Score 0 0 3 0  Altered sleeping 0 - 3 -  Tired, decreased energy 0 - 3 -  Change in appetite 0 - 1 -  Feeling bad or failure about yourself  0 - 0 -  Trouble concentrating 0 - 0 -  Moving slowly or fidgety/restless 0 - 3 -  Suicidal thoughts 0 - 0 -  PHQ-9 Score 0 - 13 -  Difficult doing work/chores - - Very difficult -   Interpretation of Total Score  Total Score Depression Severity:  1-4 = Minimal depression, 5-9 = Mild depression, 10-14 = Moderate depression, 15-19 = Moderately severe depression, 20-27 = Severe depression   Psychosocial Evaluation and Intervention:   Psychosocial Re-Evaluation:  Psychosocial Re-Evaluation     Row Name 02/01/23 [phone] 02/24/23 1643 03/22/23 0829         Psychosocial Re-Evaluation   Current issues with None Identified None Identified None Identified     Interventions Encouraged to attend Cardiac Rehabilitation for the exercise Encouraged to attend Cardiac Rehabilitation for the exercise Encouraged to attend Cardiac Rehabilitation for the exercise     Continue Psychosocial Services  No Follow up required No Follow up required No Follow up required               Psychosocial Discharge (Final Psychosocial Re-Evaluation):  Psychosocial Re-Evaluation - 03/22/23 0829       Psychosocial Re-Evaluation   Current issues with None Identified    Interventions Encouraged to attend Cardiac Rehabilitation for the exercise    Continue Psychosocial Services  No Follow up required             Vocational Rehabilitation: Provide vocational rehab assistance to qualifying candidates.   Vocational Rehab Evaluation & Intervention:  Vocational Rehab - 01/14/23 1540       Initial Vocational Rehab Evaluation & Intervention   Assessment shows need for Vocational Rehabilitation No   Shaana is unemplyed, however not interested in looking for a job currently            Education: Education Goals: Education classes will be provided on a weekly basis, covering required topics. Participant will state understanding/return demonstration of topics presented.    Education     Row Name 01/18/23 0900     Education   Cardiac Education Topics Pritikin   Select Workshops     Workshops   Educator Exercise Physiologist   Select Exercise   Exercise Workshop Exercise Basics: Building Your Action Plan   Instruction Review Code 1- Verbalizes Understanding   Class Start Time 0815   Class Stop Time 0903   Class Time Calculation (min) 48 min    Row Name 01/22/23 0900     Education  Cardiac Education Topics Pritikin   Select Core Videos     Core Videos   Educator Dietitian   Select Nutrition   Nutrition Calorie Density   Instruction Review Code 1- Verbalizes Understanding   Class Start Time 0815   Class Stop Time 0856   Class Time Calculation (min) 41 min    Row Name 01/29/23 0900     Education   Cardiac Education Topics Pritikin   Select Core Videos     Core Videos   Educator Exercise Physiologist   Select Exercise Education   Exercise Education Move It!   Instruction Review Code 1- Verbalizes Understanding   Class Start Time [phone]   Class  Stop Time 0858   Class Time Calculation (min) 42 min    Row Name 02/01/23 1600     Education   Cardiac Education Topics Pritikin   Select Workshops     Workshops   Educator Exercise Physiologist   Select Psychosocial   Psychosocial Workshop Focused Goals, Sustainable Changes   Instruction Review Code 1- Verbalizes Understanding   Class Start Time 0810   Class Stop Time 0855   Class Time Calculation (min) 45 min    Row Name 02/05/23 0900     Education   Cardiac Education Topics Pritikin   Psychologist, forensic General Education   General Education Hypertension and Heart Disease   Instruction Review Code 1- Verbalizes Understanding   Class Start Time [phone]   Class Stop Time 5409   Class Time Calculation (min) 40 min    Row Name 02/12/23 0900     Education   Cardiac Education Topics Pritikin   Select Core Videos     Core Videos   Educator Dietitian   Select Nutrition   Nutrition Dining Out - Part 1   Instruction Review Code 1- Verbalizes Understanding   Class Start Time 0815   Class Stop Time 0849   Class Time Calculation (min) 34 min    Row Name 02/22/23 1100     Education   Cardiac Education Topics Pritikin   Select Workshops     Workshops   Educator Exercise Physiologist   Select Psychosocial   Psychosocial Workshop Healthy Sleep for a Healthy Heart   Instruction Review Code 1- Verbalizes Understanding   Class Start Time 0815   Class Stop Time 0856   Class Time Calculation (min) 41 min    Row Name 03/01/23 0900     Education   Cardiac Education Topics Pritikin   Glass blower/designer Nutrition   Nutrition Workshop Fueling a Forensic psychologist   Instruction Review Code 1- Verbalizes Understanding   Class Start Time 0815   Class Stop Time 0858   Class Time Calculation (min) 43 min    Row Name 03/05/23 0900     Education   Cardiac Education Topics  Pritikin   Select Core Videos     Core Videos   Educator Exercise Physiologist   Select Psychosocial   Psychosocial How Our Thoughts Can Heal Our Hearts   Instruction Review Code 1- Verbalizes Understanding   Class Start Time 0815   Class Stop Time 0855   Class Time Calculation (min) 40 min    Row Name 03/08/23 1000     Education   Cardiac Education Topics Pritikin   [employer]  Educator Exercise Clinical cytogeneticist Psychosocial   Psychosocial Workshop From Head to Heart: The Power of a Healthy Outlook   Instruction Review Code 1- Verbalizes Understanding   Class Start Time 0815   Class Stop Time 0903   Class Time Calculation (min) 48 min    Row Name 03/12/23 0900     Education   Cardiac Education Topics Pritikin   Select Core Videos     Core Videos   Educator Exercise Physiologist   Select General Education   General Education Hypertension and Heart Disease   Instruction Review Code 1- Verbalizes Understanding   Class Start Time [phone]   Class Stop Time 0843   Class Time Calculation (min) 31 min    Row Name 03/22/23 0900     Education   Cardiac Education Topics Pritikin   Select Workshops     Workshops   Educator Exercise Physiologist   Select Exercise   Exercise Workshop Location manager and Fall Prevention   Instruction Review Code 1- Verbalizes Understanding   Class Start Time [phone]   Class Stop Time 0855   Class Time Calculation (min) 36 min            Core Videos: Exercise    Move It!  Clinical staff conducted group or individual video education with verbal and written material and guidebook.  Patient learns the recommended Pritikin exercise program. Exercise with the goal of living a long, healthy life. Some of the health benefits of exercise include controlled diabetes, healthier blood pressure levels, improved cholesterol levels, improved heart and lung capacity, improved sleep, and better body composition. Everyone  should speak with their doctor before starting or changing an exercise routine.  Biomechanical Limitations Clinical staff conducted group or individual video education with verbal and written material and guidebook.  Patient learns how biomechanical limitations can impact exercise and how we can mitigate and possibly overcome limitations to have an impactful and balanced exercise routine.  Body Composition Clinical staff conducted group or individual video education with verbal and written material and guidebook.  Patient learns that body composition (ratio of muscle mass to fat mass) is a key component to assessing overall fitness, rather than body weight alone. Increased fat mass, especially visceral belly fat, can put [place] at increased risk for metabolic syndrome, type 2 diabetes, heart disease, and even death. It is recommended to combine diet and exercise (cardiovascular and resistance training) to improve your body composition. Seek guidance from your physician and exercise physiologist before implementing an exercise routine.  Exercise Action Plan Clinical staff conducted group or individual video education with verbal and written material and guidebook.  Patient learns the recommended strategies to achieve and enjoy long-term exercise adherence, including variety, self-motivation, self-efficacy, and positive decision making. Benefits of exercise include fitness, good health, weight management, more energy, better sleep, less stress, and overall well-being.  Medical   Heart Disease Risk Reduction Clinical staff conducted group or individual video education with verbal and written material and guidebook.  Patient learns our heart is our most vital organ as it circulates oxygen, nutrients, white blood cells, and hormones throughout the entire body, and carries waste away. Data supports a plant-based eating plan like the Pritikin Program for its effectiveness in slowing progression of and  reversing heart disease. The video provides a number of recommendations to address heart disease.   Metabolic Syndrome and Belly Fat  Clinical staff conducted group or individual video education with verbal and written material and guidebook.  Patient learns what metabolic syndrome is, how it leads to heart disease, and how one can reverse it and keep it from coming back. You have metabolic syndrome if you have 3 of the following 5 criteria: abdominal obesity, high blood pressure, high triglycerides, low HDL cholesterol, and high blood sugar.  Hypertension and Heart Disease Clinical staff conducted group or individual video education with verbal and written material and guidebook.  Patient learns that high blood pressure, or hypertension, is very common in the [place]. Hypertension is largely due to excessive salt intake, but other important risk factors include being overweight, physical inactivity, drinking too much alcohol, smoking, and not eating enough potassium from fruits and vegetables. High blood pressure is a leading risk factor for heart attack, stroke, congestive heart failure, dementia, kidney failure, and premature death. Long-term effects of excessive salt intake include stiffening of the arteries and thickening of heart muscle and organ damage. Recommendations include ways to reduce hypertension and the risk of heart disease.  Diseases of Our Time - Focusing on Diabetes Clinical staff conducted group or individual video education with verbal and written material and guidebook.  Patient learns why the best way to stop diseases of our time is prevention, through food and other lifestyle changes. Medicine (such as prescription pills and surgeries) is often only a Band-Aid on the problem, not a long-term solution. Most common diseases of our time include obesity, type 2 diabetes, hypertension, heart disease, and cancer. The Pritikin Program is recommended and has been proven to help  reduce, reverse, and/or prevent the damaging effects of metabolic syndrome.  Nutrition   Overview of the Pritikin Eating Plan  Clinical staff conducted group or individual video education with verbal and written material and guidebook.  Patient learns about the Pritikin Eating Plan for disease risk reduction. The Pritikin Eating Plan emphasizes a wide variety of unrefined, minimally-processed carbohydrates, like fruits, vegetables, whole grains, and legumes. Go, Caution, and Stop food choices are explained. Plant-based and lean animal proteins are emphasized. Rationale provided for low sodium intake for blood pressure control, low added sugars for blood sugar stabilization, and low added fats and oils for coronary artery disease risk reduction and weight management.  Calorie Density  Clinical staff conducted group or individual video education with verbal and written material and guidebook.  Patient learns about calorie density and how it impacts the Pritikin Eating Plan. Knowing the characteristics of the food you choose will help you decide whether those foods will lead to weight gain or weight loss, and whether you want to consume more or less of them. Weight loss is usually a side effect of the Pritikin Eating Plan because of its focus on low calorie-dense foods.  Label Reading  Clinical staff conducted group or individual video education with verbal and written material and guidebook.  Patient learns about the Pritikin recommended label reading guidelines and corresponding recommendations regarding calorie density, added sugars, sodium content, and whole grains.  Dining Out - Part 1  Clinical staff conducted group or individual video education with verbal and written material and guidebook.  Patient learns that restaurant meals can be sabotaging because they can be so high in calories, fat, sodium, and/or sugar. Patient learns recommended strategies on how to positively address this and avoid  unhealthy pitfalls.  Facts on Fats  Clinical staff conducted group or individual video education with verbal and written material and guidebook.  Patient learns that lifestyle modifications can be just as effective, if not more so, as  many medications for lowering your risk of heart disease. A Pritikin lifestyle can help to reduce your risk of inflammation and atherosclerosis (cholesterol build-up, or plaque, in the artery walls). Lifestyle interventions such as dietary choices and physical activity address the cause of atherosclerosis. A review of the types of fats and their impact on blood cholesterol levels, along with dietary recommendations to reduce fat intake is also included.  Nutrition Action Plan  Clinical staff conducted group or individual video education with verbal and written material and guidebook.  Patient learns how to incorporate Pritikin recommendations into their lifestyle. Recommendations include planning and keeping personal health goals in mind as an important part of their success.  Healthy Mind-Set    Healthy Minds, Bodies, Hearts  Clinical staff conducted group or individual video education with verbal and written material and guidebook.  Patient learns how to identify when they are stressed. Video will discuss the impact of that stress, as well as the many benefits of stress management. Patient will also be introduced to stress management techniques. The way we think, act, and feel has an impact on our hearts.  How Our Thoughts Can Heal Our Hearts  Clinical staff conducted group or individual video education with verbal and written material and guidebook.  Patient learns that negative thoughts can cause depression and anxiety. This can result in negative lifestyle behavior and serious health problems. Cognitive behavioral therapy is an effective method to help control our thoughts in order to change and improve our emotional outlook.  Additional Videos:  Exercise     Improving Performance  Clinical staff conducted group or individual video education with verbal and written material and guidebook.  Patient learns to use a non-linear approach by alternating intensity levels and lengths of time spent exercising to help burn more calories and lose more body fat. Cardiovascular exercise helps improve heart health, metabolism, hormonal balance, blood sugar control, and recovery from fatigue. Resistance training improves strength, endurance, balance, coordination, reaction time, metabolism, and muscle mass. Flexibility exercise improves circulation, posture, and balance. Seek guidance from your physician and exercise physiologist before implementing an exercise routine and learn your capabilities and proper form for all exercise.  Introduction to Yoga  Clinical staff conducted group or individual video education with verbal and written material and guidebook.  Patient learns about yoga, a discipline of the coming together of mind, breath, and body. The benefits of yoga include improved flexibility, improved range of motion, better posture and core strength, increased lung function, weight loss, and positive self-image. Yoga's heart health benefits include lowered blood pressure, healthier heart rate, decreased cholesterol and triglyceride levels, improved immune function, and reduced stress. Seek guidance from your physician and exercise physiologist before implementing an exercise routine and learn your capabilities and proper form for all exercise.  Medical   Aging: Enhancing Your Quality of Life  Clinical staff conducted group or individual video education with verbal and written material and guidebook.  Patient learns key strategies and recommendations to stay in good physical health and enhance quality of life, such as prevention strategies, having an advocate, securing a Health Care Proxy and Power of Attorney, and keeping a list of medications and system for  tracking them. It also discusses how to avoid risk for bone loss.  Biology of Weight Control  Clinical staff conducted group or individual video education with verbal and written material and guidebook.  Patient learns that weight gain occurs because we consume more calories than we burn (eating more, moving less).  Even if your body weight is normal, you may have higher ratios of fat compared to muscle mass. Too much body fat puts you at increased risk for cardiovascular disease, heart attack, stroke, type 2 diabetes, and obesity-related cancers. In addition to exercise, following the Pritikin Eating Plan can help reduce your risk.  Decoding Lab Results  Clinical staff conducted group or individual video education with verbal and written material and guidebook.  Patient learns that lab test reflects one measurement whose values change over time and are influenced by many factors, including medication, stress, sleep, exercise, food, hydration, pre-existing medical conditions, and more. It is recommended to use the knowledge from this video to become more involved with your lab results and evaluate your numbers to speak with your doctor.   Diseases of Our Time - Overview  Clinical staff conducted group or individual video education with verbal and written material and guidebook.  Patient learns that according to the CDC, 50% to 70% of chronic diseases (such as obesity, type 2 diabetes, elevated lipids, hypertension, and heart disease) are avoidable through lifestyle improvements including healthier food choices, listening to satiety cues, and increased physical activity.  Sleep Disorders Clinical staff conducted group or individual video education with verbal and written material and guidebook.  Patient learns how good quality and duration of sleep are important to overall health and well-being. Patient also learns about sleep disorders and how they impact health along with recommendations to address  them, including discussing with a physician.  Nutrition  Dining Out - Part 2 Clinical staff conducted group or individual video education with verbal and written material and guidebook.  Patient learns how to plan ahead and communicate in order to maximize their dining experience in a healthy and nutritious manner. Included are recommended food choices based on the type of restaurant the patient is visiting.   Fueling a Banker conducted group or individual video education with verbal and written material and guidebook.  There is a strong connection between our food choices and our health. Diseases like obesity and type 2 diabetes are very prevalent and are in large-part due to lifestyle choices. The Pritikin Eating Plan provides plenty of food and hunger-curbing satisfaction. It is easy to follow, affordable, and helps reduce health risks.  Menu Workshop  Clinical staff conducted group or individual video education with verbal and written material and guidebook.  Patient learns that restaurant meals can sabotage health goals because they are often packed with calories, fat, sodium, and sugar. Recommendations include strategies to plan ahead and to communicate with the manager, chef, or server to help order a healthier meal.  Planning Your Eating Strategy  Clinical staff conducted group or individual video education with verbal and written material and guidebook.  Patient learns about the Pritikin Eating Plan and its benefit of reducing the risk of disease. The Pritikin Eating Plan does not focus on calories. Instead, it emphasizes high-quality, nutrient-rich foods. By knowing the characteristics of the foods, we choose, we can determine their calorie density and make informed decisions.  Targeting Your Nutrition Priorities  Clinical staff conducted group or individual video education with verbal and written material and guidebook.  Patient learns that lifestyle habits have  a tremendous impact on disease risk and progression. This video provides eating and physical activity recommendations based on your personal health goals, such as reducing LDL cholesterol, losing weight, preventing or controlling type 2 diabetes, and reducing high blood pressure.  Vitamins and Minerals  Clinical  staff conducted group or individual video education with verbal and written material and guidebook.  Patient learns different ways to obtain key vitamins and minerals, including through a recommended healthy diet. It is important to discuss all supplements you take with your doctor.   Healthy Mind-Set    Smoking Cessation  Clinical staff conducted group or individual video education with verbal and written material and guidebook.  Patient learns that cigarette smoking and tobacco addiction pose a serious health risk which affects millions of people. Stopping smoking will significantly reduce the risk of heart disease, lung disease, and many forms of cancer. Recommended strategies for quitting are covered, including working with your doctor to develop a successful plan.  Culinary   Becoming a Set designer conducted group or individual video education with verbal and written material and guidebook.  Patient learns that cooking at home can be healthy, cost-effective, quick, and puts them in control. Keys to cooking healthy recipes will include looking at your recipe, assessing your equipment needs, planning ahead, making it simple, choosing cost-effective seasonal ingredients, and limiting the use of added fats, salts, and sugars.  Cooking - Breakfast and Snacks  Clinical staff conducted group or individual video education with verbal and written material and guidebook.  Patient learns how important breakfast is to satiety and nutrition through the entire day. Recommendations include key foods to eat during breakfast to help stabilize blood sugar levels and to prevent  overeating at meals later in the day. Planning ahead is also a key component.  Cooking - Educational psychologist conducted group or individual video education with verbal and written material and guidebook.  Patient learns eating strategies to improve overall health, including an approach to cook more at home. Recommendations include thinking of animal protein as a side on your plate rather than center stage and focusing instead on lower calorie dense options like vegetables, fruits, whole grains, and plant-based proteins, such as beans. Making sauces in large quantities to freeze for later and leaving the skin on your vegetables are also recommended to maximize your experience.  Cooking - Healthy Salads and Dressing Clinical staff conducted group or individual video education with verbal and written material and guidebook.  Patient learns that vegetables, fruits, whole grains, and legumes are the foundations of the Pritikin Eating Plan. Recommendations include how to incorporate each of these in flavorful and healthy salads, and how to create homemade salad dressings. Proper handling of ingredients is also covered. Cooking - Soups and [employer] - Soups and Desserts Clinical staff conducted group or individual video education with verbal and written material and guidebook.  Patient learns that Pritikin soups and desserts make for easy, nutritious, and delicious snacks and meal components that are low in sodium, fat, sugar, and calorie density, while high in vitamins, minerals, and filling fiber. Recommendations include simple and healthy ideas for soups and desserts.   Overview     The Pritikin Solution Program Overview Clinical staff conducted group or individual video education with verbal and written material and guidebook.  Patient learns that the results of the Pritikin Program have been documented in more than 100 articles published in peer-reviewed journals, and the benefits  include reducing risk factors for (and, in some cases, even reversing) high cholesterol, high blood pressure, type 2 diabetes, obesity, and more! An overview of the three key pillars of the Pritikin Program will be covered: eating well, doing regular exercise, and having a healthy mind-set.  WORKSHOPS  Exercise: Exercise Basics: Building Your Action Plan Clinical staff led group instruction and group discussion with PowerPoint presentation and patient guidebook. To enhance the learning environment the use of posters, models and videos may be added. At the conclusion of this workshop, patients will comprehend the difference between physical activity and exercise, as well as the benefits of incorporating both, into their routine. Patients will understand the FITT (Frequency, Intensity, Time, and Type) principle and how to use it to build an exercise action plan. In addition, safety concerns and other considerations for exercise and cardiac rehab will be addressed by the presenter. The purpose of this lesson is to promote a comprehensive and effective weekly exercise routine in order to improve patients' overall level of fitness.   Managing Heart Disease: Your Path to a Healthier Heart Clinical staff led group instruction and group discussion with PowerPoint presentation and patient guidebook. To enhance the learning environment the use of posters, models and videos may be added.At the conclusion of this workshop, patients will understand the anatomy and physiology of the heart. Additionally, they will understand how Pritikin's three pillars impact the risk factors, the progression, and the management of heart disease.  The purpose of this lesson is to provide a high-level overview of the heart, heart disease, and how the Pritikin lifestyle positively impacts risk factors.  Exercise Biomechanics Clinical staff led group instruction and group discussion with PowerPoint presentation and patient  guidebook. To enhance the learning environment the use of posters, models and videos may be added. Patients will learn how the structural parts of their bodies function and how these functions impact their daily activities, movement, and exercise. Patients will learn how to promote a neutral spine, learn how to manage pain, and identify ways to improve their physical movement in order to promote healthy living. The purpose of this lesson is to expose patients to common physical limitations that impact physical activity. Participants will learn practical ways to adapt and manage aches and pains, and to minimize their effect on regular exercise. Patients will learn how to maintain good posture while sitting, walking, and lifting.  Balance Training and Fall Prevention  Clinical staff led group instruction and group discussion with PowerPoint presentation and patient guidebook. To enhance the learning environment the use of posters, models and videos may be added. At the conclusion of this workshop, patients will understand the importance of their sensorimotor skills (vision, proprioception, and the vestibular system) in maintaining their ability to balance as they age. Patients will apply a variety of balancing exercises that are appropriate for their current level of function. Patients will understand the common causes for poor balance, possible solutions to these problems, and ways to modify their physical environment in order to minimize their fall risk. The purpose of this lesson is to teach patients about the importance of maintaining balance as they age and ways to minimize their risk of falling.  WORKSHOPS   Nutrition:  Fueling a Ship broker led group instruction and group discussion with PowerPoint presentation and patient guidebook. To enhance the learning environment the use of posters, models and videos may be added. Patients will review the foundational principles of the  Pritikin Eating Plan and understand what constitutes a serving size in each of the food groups. Patients will also learn Pritikin-friendly foods that are better choices when away from home and review make-ahead meal and snack options. Calorie density will be reviewed and applied to three nutrition priorities: weight maintenance, weight loss,  and weight gain. The purpose of this lesson is to reinforce (in a group setting) the key concepts around what patients are recommended to eat and how to apply these guidelines when away from home by planning and selecting Pritikin-friendly options. Patients will understand how calorie density may be adjusted for different weight management goals.  Mindful Eating  Clinical staff led group instruction and group discussion with PowerPoint presentation and patient guidebook. To enhance the learning environment the use of posters, models and videos may be added. Patients will briefly review the concepts of the Pritikin Eating Plan and the importance of low-calorie dense foods. The concept of mindful eating will be introduced as well as the importance of paying attention to internal hunger signals. Triggers for non-hunger eating and techniques for dealing with triggers will be explored. The purpose of this lesson is to provide patients with the opportunity to review the basic principles of the Pritikin Eating Plan, discuss the value of eating mindfully and how to measure internal cues of hunger and fullness using the Hunger Scale. Patients will also discuss reasons for non-hunger eating and learn strategies to use for controlling emotional eating.  Targeting Your Nutrition Priorities Clinical staff led group instruction and group discussion with PowerPoint presentation and patient guidebook. To enhance the learning environment the use of posters, models and videos may be added. Patients will learn how to determine their genetic susceptibility to disease by reviewing their  family history. Patients will gain insight into the importance of diet as part of an overall healthy lifestyle in mitigating the impact of genetics and other environmental insults. The purpose of this lesson is to provide patients with the opportunity to assess their personal nutrition priorities by looking at their family history, their own health history and current risk factors. Patients will also be able to discuss ways of prioritizing and modifying the Pritikin Eating Plan for their highest risk areas  Menu  Clinical staff led group instruction and group discussion with PowerPoint presentation and patient guidebook. To enhance the learning environment the use of posters, models and videos may be added. Using menus brought in from [employer], or printed from [employer], patients will apply the Pritikin dining out guidelines that were presented in the [employer] video. Patients will also be able to practice these guidelines in a variety of provided scenarios. The purpose of this lesson is to provide patients with the opportunity to practice hands-on learning of the Pritikin Dining Out guidelines with actual menus and practice scenarios.  Label Reading Clinical staff led group instruction and group discussion with PowerPoint presentation and patient guidebook. To enhance the learning environment the use of posters, models and videos may be added. Patients will review and discuss the Pritikin label reading guidelines presented in Pritikin's Label Reading Educational series video. Using fool labels brought in from local grocery stores and markets, patients will apply the label reading guidelines and determine if the packaged food meet the Pritikin guidelines. The purpose of this lesson is to provide patients with the opportunity to review, discuss, and practice hands-on learning of the Pritikin Label Reading guidelines with actual packaged food labels. Cooking School  Pritikin's  [employer] are designed to teach patients ways to prepare quick, simple, and affordable recipes at home. The importance of nutrition's role in chronic disease risk reduction is reflected in its emphasis in the overall Pritikin program. By learning how to prepare essential core Pritikin Eating Plan recipes, patients will increase control over  what they eat; be able to customize the flavor of foods without the use of added salt, sugar, or fat; and improve the quality of the food they consume. By learning a set of core recipes which are easily assembled, quickly prepared, and affordable, patients are more likely to prepare more healthy foods at home. These workshops focus on convenient breakfasts, simple entres, side dishes, and desserts which can be prepared with minimal effort and are consistent with nutrition recommendations for cardiovascular risk reduction. Cooking [employer] are taught by a Armed forces logistics/support/administrative officer (RD) who has been trained by the [employer]. The chef or RD has a clear understanding of the importance of minimizing - if not completely eliminating - added fat, sugar, and sodium in recipes. Throughout the series of Cooking School Workshop sessions, patients will learn about healthy ingredients and efficient methods of cooking to build confidence in their capability to prepare    Cooking School weekly topics:  Adding Flavor- Sodium-Free  Fast and Healthy Breakfasts  Powerhouse Plant-Based Proteins  Satisfying Salads and Dressings  Simple Sides and Sauces  International Cuisine-Spotlight on the [employer] Zones  Delicious Desserts  Savory Soups  [employer] - Meals in a Astronomer Appetizers and Snacks  Comforting Weekend Breakfasts  One-Pot Wonders   Fast Evening Meals  Landscape architect Your Pritikin Plate  WORKSHOPS   Healthy Mindset (Psychosocial):  Focused Goals, Sustainable Changes Clinical staff led group  instruction and group discussion with PowerPoint presentation and patient guidebook. To enhance the learning environment the use of posters, models and videos may be added. Patients will be able to apply effective goal setting strategies to establish at least one personal goal, and then take consistent, meaningful action toward that goal. They will learn to identify common barriers to achieving personal goals and develop strategies to overcome them. Patients will also gain an understanding of how our mind-set can impact our ability to achieve goals and the importance of cultivating a positive and growth-oriented mind-set. The purpose of this lesson is to provide patients with a deeper understanding of how to set and achieve personal goals, as well as the tools and strategies needed to overcome common obstacles which may arise along the way.  From Head to Heart: The Power of a Healthy Outlook  Clinical staff led group instruction and group discussion with PowerPoint presentation and patient guidebook. To enhance the learning environment the use of posters, models and videos may be added. Patients will be able to recognize and describe the impact of emotions and mood on physical health. They will discover the importance of self-care and explore self-care practices which may work for them. Patients will also learn how to utilize the 4 C's to cultivate a healthier outlook and better manage stress and challenges. The purpose of this lesson is to demonstrate to patients how a healthy outlook is an essential part of maintaining good health, especially as they continue their cardiac rehab journey.  Healthy Sleep for a Healthy Heart Clinical staff led group instruction and group discussion with PowerPoint presentation and patient guidebook. To enhance the learning environment the use of posters, models and videos may be added. At the conclusion of this workshop, patients will be able to demonstrate knowledge of the  importance of sleep to overall health, well-being, and quality of life. They will understand the symptoms of, and treatments for, common sleep disorders. Patients will also be able to identify daytime and nighttime behaviors which impact sleep,  and they will be able to apply these tools to help manage sleep-related challenges. The purpose of this lesson is to provide patients with a general overview of sleep and outline the importance of quality sleep. Patients will learn about a few of the most common sleep disorders. Patients will also be introduced to the concept of "sleep hygiene," and discover ways to self-manage certain sleeping problems through simple daily behavior changes. Finally, the workshop will motivate patients by clarifying the links between quality sleep and their goals of heart-healthy living.   Recognizing and Reducing Stress Clinical staff led group instruction and group discussion with PowerPoint presentation and patient guidebook. To enhance the learning environment the use of posters, models and videos may be added. At the conclusion of this workshop, patients will be able to understand the types of stress reactions, differentiate between acute and chronic stress, and recognize the impact that chronic stress has on their health. They will also be able to apply different coping mechanisms, such as reframing negative self-talk. Patients will have the opportunity to practice a variety of stress management techniques, such as deep abdominal breathing, progressive muscle relaxation, and/or guided imagery.  The purpose of this lesson is to educate patients on the role of stress in their lives and to provide healthy techniques for coping with it.  Learning Barriers/Preferences:  Learning Barriers/Preferences - 01/14/23 1540       Learning Barriers/Preferences   Learning Barriers Sight   wears glasses   Learning Preferences Computer/Internet;Group Instruction;Individual  Instruction;Pictoral;Skilled Demonstration;Verbal Instruction;Video;Written Material             Education Topics:  Knowledge Questionnaire Score:  Knowledge Questionnaire Score - 01/14/23 1546       Knowledge Questionnaire Score   Pre Score 19/24             Core Components/Risk Factors/Patient Goals at Admission:  Personal Goals and Risk Factors at Admission - 01/14/23 1541       Core Components/Risk Factors/Patient Goals on Admission    Weight Management Yes;Obesity;Weight Loss    Intervention Weight Management: Provide education and appropriate resources to help participant work on and attain dietary goals.;Obesity: Provide education and appropriate resources to help participant work on and attain dietary goals.;Weight Management: Develop a combined nutrition and exercise program designed to reach desired caloric intake, while maintaining appropriate intake of nutrient and fiber, sodium and fats, and appropriate energy expenditure required for the weight goal.;Weight Management/Obesity: Establish reasonable short term and long term weight goals.    Admit Weight 233 lb 11 oz (106 kg)    Goal Weight: Long Term 160 lb (72.6 kg)   pt personal goal   Expected Outcomes Short Term: Continue to assess and modify interventions until short term weight is achieved;Long Term: Adherence to nutrition and physical activity/exercise program aimed toward attainment of established weight goal;Weight Loss: Understanding of general recommendations for a balanced deficit meal plan, which promotes 1-2 lb weight loss per week and includes a negative energy balance of [phone] kcal/d;Understanding recommendations for meals to include 15-35% energy as protein, 25-35% energy from fat, 35-60% energy from carbohydrates, less than 200mg  of dietary cholesterol, 20-35 gm of total fiber daily;Understanding of distribution of calorie intake throughout the day with the consumption of 4-5 meals/snacks     Intervention Provide education about signs/symptoms and action to take for hypo/hyperglycemia.;Provide education about proper nutrition, including hydration, and aerobic/resistive exercise prescription along with prescribed medications to achieve blood glucose in normal ranges: Fasting glucose 65-99 mg/dL  Expected Outcomes Short Term: Participant verbalizes understanding of the signs/symptoms and immediate care of hyper/hypoglycemia, proper foot care and importance of medication, aerobic/resistive exercise and nutrition plan for blood glucose control.;Long Term: Attainment of HbA1C < 7%.    Hypertension Yes    Intervention Provide education on lifestyle modifcations including regular physical activity/exercise, weight management, moderate sodium restriction and increased consumption of fresh fruit, vegetables, and low fat dairy, alcohol moderation, and smoking cessation.;Monitor prescription use compliance.    Expected Outcomes Short Term: Continued assessment and intervention until BP is < 140/8mm HG in hypertensive participants. < 130/35mm HG in hypertensive participants with diabetes, heart failure or chronic kidney disease.;Long Term: Maintenance of blood pressure at goal levels.    Lipids Yes    Intervention Provide education and support for participant on nutrition & aerobic/resistive exercise along with prescribed medications to achieve LDL 70mg , HDL >40mg .    Expected Outcomes Short Term: Participant states understanding of desired cholesterol values and is compliant with medications prescribed. Participant is following exercise prescription and nutrition guidelines.;Long Term: Cholesterol controlled with medications as prescribed, with individualized exercise RX and with personalized nutrition plan. Value goals: LDL < 70mg , HDL > 40 mg.             Core Components/Risk Factors/Patient Goals Review:   Goals and Risk Factor Review     Row Name 02/01/23 1610 02/24/23 1644 03/22/23 0831          Core Components/Risk Factors/Patient Goals Review   Personal Goals Review Weight Management/Obesity;Hypertension;Lipids;Diabetes Weight Management/Obesity;Hypertension;Lipids;Diabetes Weight Management/Obesity;Hypertension;Lipids;Diabetes     Review Breseis is off to a good start to exercise at traditional cardiac rehab. Vital signs and CBG's have been stable. Kijuana has lost 3.6 kg since starting the program Sabiha is doing well with  exercise at traditional cardiac rehab. Vital signs and CBG's have been stable. Francesca has lost 3.4 kg since starting the program Derionna is doing well with  exercise at traditional cardiac rehab. Vital signs and CBG's have been stable. Takeena has lost 6.5  kg since starting the program     Expected Outcomes Debbra will continue to participate in traditional cardiac rehab for exercise, nutrition and lifestyle modifications Queenester will continue to participate in traditional cardiac rehab for exercise, nutrition and lifestyle modifications Loreda will continue to participate in traditional cardiac rehab for exercise, nutrition and lifestyle modifications              Core Components/Risk Factors/Patient Goals at Discharge (Final Review):   Goals and Risk Factor Review - 03/22/23 0831       Core Components/Risk Factors/Patient Goals Review   Personal Goals Review Weight Management/Obesity;Hypertension;Lipids;Diabetes    Review Maham is doing well with  exercise at traditional cardiac rehab. Vital signs and CBG's have been stable. Valary has lost 6.5  kg since starting the program    Expected Outcomes Abrielle will continue to participate in traditional cardiac rehab for exercise, nutrition and lifestyle modifications             ITP Comments:  ITP Comments     Row Name 01/14/23 1449 02/01/23 0831 02/24/23 1642 03/22/23 0820     ITP Comments Dr. Armanda Magic medical director. Introduction to pritikin education/ intensive cardiac rehab.  Initial orientation packet reveiwed with patient. 30 Day ITP comments. Aidan is off to a good start to exercise at traditional cardiac rehab 30 Day ITP comments. Mylynn has good participation and attendance with  exercise at traditional cardiac rehab 30 Day ITP comments.  Panzie has good participation and attendance with  exercise at traditional cardiac rehab. Raeleigh will complete intensive cardiac rehab on 03/26/23             Comments: See ITP comments.Thayer Headings RN BSN

## 2023-03-24 ENCOUNTER — Encounter (HOSPITAL_COMMUNITY): Payer: Medicaid Other

## 2023-03-26 ENCOUNTER — Encounter (HOSPITAL_COMMUNITY)
Admission: RE | Admit: 2023-03-26 | Discharge: 2023-03-26 | Disposition: A | Discharge status: Home / Self Care | Payer: Medicaid Other | Source: Ambulatory Visit | Attending: Cardiology | Admitting: Cardiology

## 2023-03-26 DIAGNOSIS — Z9861 Coronary angioplasty status: Secondary | ICD-10-CM

## 2023-03-26 DIAGNOSIS — Z955 Presence of coronary angioplasty implant and graft: Secondary | ICD-10-CM | POA: Diagnosis not present

## 2023-03-29 ENCOUNTER — Encounter (HOSPITAL_COMMUNITY)
Admission: RE | Admit: 2023-03-29 | Discharge: 2023-03-29 | Disposition: A | Discharge status: Home / Self Care | Payer: Medicaid Other | Source: Ambulatory Visit | Attending: Cardiology | Admitting: Cardiology

## 2023-03-29 DIAGNOSIS — Z9861 Coronary angioplasty status: Secondary | ICD-10-CM

## 2023-03-29 DIAGNOSIS — Z955 Presence of coronary angioplasty implant and graft: Secondary | ICD-10-CM | POA: Diagnosis not present

## 2023-04-02 ENCOUNTER — Encounter (HOSPITAL_COMMUNITY): Payer: Medicaid Other

## 2023-04-05 ENCOUNTER — Encounter (HOSPITAL_COMMUNITY)
Admission: RE | Admit: 2023-04-05 | Discharge: 2023-04-05 | Disposition: A | Discharge status: Home / Self Care | Payer: Medicaid Other | Source: Ambulatory Visit | Attending: Cardiology | Admitting: Cardiology

## 2023-04-05 DIAGNOSIS — Z9861 Coronary angioplasty status: Secondary | ICD-10-CM

## 2023-04-05 DIAGNOSIS — Z955 Presence of coronary angioplasty implant and graft: Secondary | ICD-10-CM | POA: Diagnosis not present

## 2023-04-09 ENCOUNTER — Encounter (HOSPITAL_COMMUNITY)
Admission: RE | Admit: 2023-04-09 | Discharge: 2023-04-09 | Disposition: A | Discharge status: Home / Self Care | Payer: Medicaid Other | Source: Ambulatory Visit | Attending: Cardiology | Admitting: Cardiology

## 2023-04-09 VITALS — Ht 62.0 in | Wt 220.2 lb

## 2023-04-09 DIAGNOSIS — Z955 Presence of coronary angioplasty implant and graft: Secondary | ICD-10-CM

## 2023-04-09 NOTE — Progress Notes (Incomplete)
Discharge Progress Report  Patient Details  Name: [name] MRN: [id number] Date of Birth: December 12, 1965 Referring Provider:   Flowsheet Row CARDIAC REHAB PHASE II ORIENTATION from 01/14/2023 in [facility]  Referring Provider Patwardhan, Anabel Bene, MD        Number of Visits: 36  Reason for Discharge:  Patient reached a stable level of exercise. Patient independent in their exercise. Patient has met program and personal goals.  Smoking History:  Social History   Tobacco Use  Smoking Status Former   Packs/day: 0.50   Years: 10.00   Additional pack years: 0.00   Total pack years: 5.00   Types: Cigarettes   Quit date: 10/19/1993   Years since quitting: 29.4  Smokeless Tobacco Never    Diagnosis:  S/P right coronary artery (RCA) stent placement  ADL [facility]:   Initial Exercise Prescription:  Initial Exercise Prescription - 01/14/23 1500       Date of Initial Exercise RX and Referring Provider   Date 01/14/23    Referring Provider Elder Negus, MD    Expected Discharge Date 03/26/23      Treadmill   MPH 1.7    Grade 0    Minutes 15    METs 2      NuStep   Level 1    SPM 65    Minutes 15    METs 2      Prescription Details   Frequency (times per week) 3    Duration Progress to 30 minutes of continuous aerobic without signs/symptoms of physical distress      Intensity   THRR 40-80% of Max Heartrate 66-133    Ratings of Perceived Exertion 11-13    Perceived Dyspnea 0-4      Progression   Progression Continue progressive overload as per policy without signs/symptoms or physical distress.      Resistance Training   Training Prescription Yes    Weight 2    Reps 10-15             Discharge Exercise Prescription (Final Exercise Prescription Changes):  Exercise Prescription Changes - 04/05/23 0824       Response to Exercise   Blood Pressure (Admit) 142/82    Blood Pressure  (Exercise) 124/78    Blood Pressure (Exit) 102/66    Heart Rate (Admit) 72 bpm    Heart Rate (Exercise) 119 bpm    Heart Rate (Exit) 83 bpm    Rating of Perceived Exertion (Exercise) 9    Perceived Dyspnea (Exercise) 0    Symptoms none    Comments Reviewed MET's and goals    Duration Continue with 30 min of aerobic exercise without signs/symptoms of physical distress.    Intensity THRR unchanged      Progression   Progression Continue to progress workloads to maintain intensity without signs/symptoms of physical distress.    Average METs 3.04      Resistance Training   Training Prescription Yes    Weight 3    Reps 10-15    Time 10 Minutes      Treadmill   MPH 2.7    Grade 0    Minutes 15    METs 3.07      NuStep   Level 3    SPM 113    Minutes 15    METs 3      Home Exercise Plan   Plans to continue exercise at Home (  comment)   walking, hand weights   Frequency Add 3 additional days to program exercise sessions.    Initial Home Exercises Provided 02/05/23             Functional Capacity:  6 Minute Walk     Row Name 01/14/23 1550         6 Minute Walk   Phase Initial     Distance 1045 feet     Walk Time 6 minutes     # of Rest Breaks 0     MPH 1.98     METS 2.55     RPE 11     Perceived Dyspnea  0     VO2 Peak 8.94     Symptoms No     Resting HR 92 bpm     Resting BP 118/62     Resting Oxygen Saturation  100 %     Exercise Oxygen Saturation  during 6 min walk 99 %     Max Ex. HR 109 bpm     Max Ex. BP 130/80     2 Minute Post BP 116/74              Psychological, QOL, Others - Outcomes: PHQ 2/9:    04/09/2023    8:14 AM 01/14/2023    4:17 PM 07/14/2022    4:18 PM 04/02/2022    2:20 PM 12/22/2017    3:10 PM  Depression screen PHQ 2/9  Decreased Interest 0 0 0 3 0  Down, Depressed, Hopeless 0 0 0 0 0  PHQ - 2 Score 0 0 0 3 0  Altered sleeping 0 0  3   Tired, decreased energy 0 0  3   Change in appetite 0 0  1   Feeling bad or failure  about yourself  0 0  0   Trouble concentrating 0 0  0   Moving slowly or fidgety/restless 0 0  3   Suicidal thoughts  0  0   PHQ-9 Score 0 0  13   Difficult doing work/chores    Very difficult     Quality of Life:  Quality of Life - 01/14/23 1618       Quality of Life   Select Quality of Life      Quality of Life Scores   Health/Function Pre 29.2 %    Socioeconomic Pre 27.43 %    Psych/Spiritual Pre 30 %    Family Pre 28.8 %    GLOBAL Pre 28.91 %             Personal Goals: Goals established at orientation with interventions provided to work toward goal.  Personal Goals and Risk Factors at Admission - 01/14/23 1541       Core Components/Risk Factors/Patient Goals on Admission    Weight Management Yes;Obesity;Weight Loss    Intervention Weight Management: Provide education and appropriate resources to help participant work on and attain dietary goals.;Obesity: Provide education and appropriate resources to help participant work on and attain dietary goals.;Weight Management: Develop a combined nutrition and exercise program designed to reach desired caloric intake, while maintaining appropriate intake of nutrient and fiber, sodium and fats, and appropriate energy expenditure required for the weight goal.;Weight Management/Obesity: Establish reasonable short term and long term weight goals.    Admit Weight 233 lb 11 oz (106 kg)    Goal Weight: Long Term 160 lb (72.6 kg)   pt personal goal   Expected Outcomes Short Term:  Continue to assess and modify interventions until short term weight is achieved;Long Term: Adherence to nutrition and physical activity/exercise program aimed toward attainment of established weight goal;Weight Loss: Understanding of general recommendations for a balanced deficit meal plan, which promotes 1-2 lb weight loss per week and includes a negative energy balance of [phone] kcal/d;Understanding recommendations for meals to include 15-35% energy as protein,  25-35% energy from fat, 35-60% energy from carbohydrates, less than 200mg  of dietary cholesterol, 20-35 gm of total fiber daily;Understanding of distribution of calorie intake throughout the day with the consumption of 4-5 meals/snacks    Intervention Provide education about signs/symptoms and action to take for hypo/hyperglycemia.;Provide education about proper nutrition, including hydration, and aerobic/resistive exercise prescription along with prescribed medications to achieve blood glucose in normal ranges: Fasting glucose 65-99 mg/dL    Expected Outcomes Short Term: Participant verbalizes understanding of the signs/symptoms and immediate care of hyper/hypoglycemia, proper foot care and importance of medication, aerobic/resistive exercise and nutrition plan for blood glucose control.;Long Term: Attainment of HbA1C < 7%.    Hypertension Yes    Intervention Provide education on lifestyle modifcations including regular physical activity/exercise, weight management, moderate sodium restriction and increased consumption of fresh fruit, vegetables, and low fat dairy, alcohol moderation, and smoking cessation.;Monitor prescription use compliance.    Expected Outcomes Short Term: Continued assessment and intervention until BP is < 140/67mm HG in hypertensive participants. < 130/92mm HG in hypertensive participants with diabetes, heart failure or chronic kidney disease.;Long Term: Maintenance of blood pressure at goal levels.    Lipids Yes    Intervention Provide education and support for participant on nutrition & aerobic/resistive exercise along with prescribed medications to achieve LDL 70mg , HDL >40mg .    Expected Outcomes Short Term: Participant states understanding of desired cholesterol values and is compliant with medications prescribed. Participant is following exercise prescription and nutrition guidelines.;Long Term: Cholesterol controlled with medications as prescribed, with individualized exercise  RX and with personalized nutrition plan. Value goals: LDL < 70mg , HDL > 40 mg.              Personal Goals Discharge:  Goals and Risk Factor Review     Row Name 02/01/23 3664 02/24/23 1644 03/22/23 0831         Core Components/Risk Factors/Patient Goals Review   Personal Goals Review Weight Management/Obesity;Hypertension;Lipids;Diabetes Weight Management/Obesity;Hypertension;Lipids;Diabetes Weight Management/Obesity;Hypertension;Lipids;Diabetes     Review [name] is off to a good start to exercise at traditional cardiac rehab. Vital signs and CBG's have been stable. [name] has lost 3.6 kg since starting the program [name] is doing well with  exercise at traditional cardiac rehab. Vital signs and CBG's have been stable. [name] has lost 3.4 kg since starting the program [name] is doing well with  exercise at traditional cardiac rehab. Vital signs and CBG's have been stable. [name] has lost 6.5  kg since starting the program     Expected Outcomes [name] will continue to participate in traditional cardiac rehab for exercise, nutrition and lifestyle modifications [name] will continue to participate in traditional cardiac rehab for exercise, nutrition and lifestyle modifications [name] will continue to participate in traditional cardiac rehab for exercise, nutrition and lifestyle modifications              Exercise Goals and Review:  Exercise Goals     Row Name 01/14/23 1538             Exercise Goals   Increase Physical Activity Yes       Intervention Provide  advice, education, support and counseling about physical activity/exercise needs.;Develop an individualized exercise prescription for aerobic and resistive training based on initial evaluation findings, risk stratification, comorbidities and participant's personal goals.       Expected Outcomes Short Term: Attend rehab on a regular basis to increase amount of physical activity.;Long Term: Exercising regularly at least  3-5 days a week.;Long Term: Add in home exercise to make exercise part of routine and to increase amount of physical activity.       Increase Strength and Stamina Yes       Intervention Provide advice, education, support and counseling about physical activity/exercise needs.;Develop an individualized exercise prescription for aerobic and resistive training based on initial evaluation findings, risk stratification, comorbidities and participant's personal goals.       Expected Outcomes Short Term: Increase workloads from initial exercise prescription for resistance, speed, and METs.;Short Term: Perform resistance training exercises routinely during rehab and add in resistance training at home;Long Term: Improve cardiorespiratory fitness, muscular endurance and strength as measured by increased METs and functional capacity ( )       Able to understand and use rate of perceived exertion (RPE) scale Yes       Intervention Provide education and explanation on how to use RPE scale       Expected Outcomes Short Term: Able to use RPE daily in rehab to express subjective intensity level;Long Term:  Able to use RPE to guide intensity level when exercising independently       Able to understand and use Dyspnea scale Yes       Intervention Provide education and explanation on how to use Dyspnea scale       Expected Outcomes Short Term: Able to use Dyspnea scale daily in rehab to express subjective sense of shortness of breath during exertion;Long Term: Able to use Dyspnea scale to guide intensity level when exercising independently       Knowledge and understanding of Target Heart Rate Range (THRR) Yes       Intervention Provide education and explanation of THRR including how the numbers were predicted and where they are located for reference       Expected Outcomes Short Term: Able to state/look up THRR;Long Term: Able to use THRR to govern intensity when exercising independently;Short Term: Able to use daily as  guideline for intensity in rehab       Understanding of Exercise Prescription Yes       Intervention Provide education, explanation, and written materials on patient's individual exercise prescription       Expected Outcomes Short Term: Able to explain program exercise prescription;Long Term: Able to explain home exercise prescription to exercise independently                Exercise Goals Re-Evaluation:  Exercise Goals Re-Evaluation     Row Name 01/18/23 0932 02/05/23 0830 02/26/23 0830 04/05/23 0827       Exercise Goal Re-Evaluation   Exercise Goals Review Increase Physical Activity;Understanding of Exercise Prescription;Increase Strength and Stamina;Knowledge and understanding of Target Heart Rate Range (THRR);Able to understand and use rate of perceived exertion (RPE) scale Increase Physical Activity;Understanding of Exercise Prescription;Increase Strength and Stamina;Knowledge and understanding of Target Heart Rate Range (THRR);Able to understand and use rate of perceived exertion (RPE) scale;Able to check pulse independently Increase Physical Activity;Understanding of Exercise Prescription;Increase Strength and Stamina;Knowledge and understanding of Target Heart Rate Range (THRR);Able to understand and use rate of perceived exertion (RPE) scale;Able to check pulse independently Increase Physical  Activity;Understanding of Exercise Prescription;Increase Strength and Stamina;Knowledge and understanding of Target Heart Rate Range (THRR);Able to understand and use rate of perceived exertion (RPE) scale;Able to check pulse independently    Comments Pt first day in the [facility] program. Pt tolerated exercise well with an average MET level of 2.2. Pt is learning her THRR, RPE and ExRx. Off to a great start` Reviewed METs, goals, and home exrx with pt today. Pt is tolerating exercise well with an avg MET levle of 2.35. Pt feels ready to increase resistance on nustep and will do so next week. Pt  feels good about her progress within the program, she has lost 6lbs since starting and stated that she feels stronger than when she began. She excited for the rest of the program nad eager to progress workloads. She has maintained a very positive atitude so far. Pt has already been walking at home an additional 3 day/wk for at time. She also has 5lb wts at home that she uses 2d/wk as well. Discussed with pt how to measure pulse with fingers and provided paper instructions. Reviewed METs and goals. Pt is tolerating exercise well with an avg MET levle of 2.47. Pt feels good about her progress so far and is increasing her MET's, and strength and stamina Reviewed METs and goals. Pt is tolerating exercise well with an avg MET levle of 3.04. She feels good about her goals and in progressing her MET's. Pt states she has loved the mindfulness sessions and the program as a whole has been very helpful for her.    Expected Outcomes Will continue to monitor pt and progress workloads as tolerated without sign or symptom Will continue to monitor pt and progress workloads as tolerated without sign or symptom Will continue to monitor pt and progress workloads as tolerated without sign or symptom Will continue to monitor pt and progress workloads as tolerated without sign or symptom             Nutrition & Weight - Outcomes:  Pre Biometrics - 01/14/23 1452       Pre Biometrics   Waist Circumference 51.5 inches    Hip Circumference 56 inches    Waist to Hip Ratio 0.92 %    Triceps Skinfold 55 mm    % Body Fat 56.3 %    Grip Strength 32 kg    Flexibility 16.5 in    Single Leg Stand 20.87 seconds              Nutrition:  Nutrition Therapy & Goals - 03/19/23 0852       Nutrition Therapy   Diet Heart Healthy/Carbohydrate Consistent    Drug/Food Interactions Statins/Certain Fruits      Personal Nutrition Goals   Nutrition Goal Patient to identify strategies for reducing cardiovascular risk by  attending the [facility] education and nutrition series weekly.    Personal Goal #2 Patient to improve diet quality by using the plate method as a guide for meal planning to include lean protein/plant protein, fruits, vegetables, whole grains, nonfat dairy as part of a well-balanced diet.    Personal Goal #3 Patient to reduce sodium intake to 1500mg  per day    Personal Goal #4 Patient to identify strategies for blood sugar control with A1c goal <7%.    Comments Goals in action. [name] attend the [employer] and nutrition series as able (Mondays&Fridays); she has attended 4 sessions at this time. She has started making many lifestyle changes. She continues ozempic  for weight loss and blood sugar control; she is down 8.6# since starting with our program. She will continue to benefit from participation in intensive cardiac rehab for nutrition, exercise, and lifestyle modification.      Intervention Plan   Intervention Prescribe, educate and counsel regarding individualized specific dietary modifications aiming towards targeted core components such as weight, hypertension, lipid management, diabetes, heart failure and other comorbidities.;Nutrition handout(s) given to patient.    Expected Outcomes Short Term Goal: Understand basic principles of dietary content, such as calories, fat, sodium, cholesterol and nutrients.;Long Term Goal: Adherence to prescribed nutrition plan.             Nutrition Discharge:   Education Questionnaire Score:  Knowledge Questionnaire Score - 01/14/23 1546       Knowledge Questionnaire Score   Pre Score 19/24             Goals reviewed with patient; copy given to patient.  Goals reviewed with patient; copy given to patient. Pt graduates from Intensive/Traditional cardiac rehab program today with completion of 36 exercise and education sessions. Pt maintained good attendance and progressed nicely during their participation in rehab as evidenced by  increased MET level 2.2 to 3.04. Increased her steps by 825. Medication list reconciled. Repeat PHQ score 0. Pt has made significant lifestyle changes and should be commended for their success. [name] achieved her goals during cardiac rehab. Pt plans to continue exercise at by walking, stretching, and going top the gym 3-5 times per week for 30 min.

## 2023-04-16 ENCOUNTER — Telehealth: Payer: Self-pay

## 2023-04-16 NOTE — Telephone Encounter (Signed)
VMT pt requesting call back discuss starting PREP

## 2023-04-26 ENCOUNTER — Telehealth: Payer: Self-pay

## 2023-04-26 NOTE — Telephone Encounter (Signed)
Call to pt reference starting PREP. Can start 06/01/23 T/TH 1p-215p at [place].  Has my number for call back. Will call her closer to start of class to do initial coaching in person.

## 2023-05-06 ENCOUNTER — Ambulatory Visit: Payer: Medicaid Other | Admitting: Cardiology

## 2023-05-12 ENCOUNTER — Other Ambulatory Visit: Payer: Self-pay

## 2023-05-12 MED ORDER — NITROGLYCERIN 0.4 MG SL SUBL: 0.4000 mg | SUBLINGUAL_TABLET | SUBLINGUAL | 0 refills | Status: DC | PRN

## 2023-05-14 ENCOUNTER — Encounter: Payer: Self-pay | Admitting: Cardiology

## 2023-05-14 ENCOUNTER — Ambulatory Visit: Payer: Medicaid Other | Admitting: Cardiology

## 2023-05-14 VITALS — BP 143/85 | HR 87 | Resp 16 | Ht 62.0 in | Wt 217.0 lb

## 2023-05-14 DIAGNOSIS — R0781 Pleurodynia: Secondary | ICD-10-CM

## 2023-05-14 DIAGNOSIS — I10 Essential (primary) hypertension: Secondary | ICD-10-CM

## 2023-05-14 DIAGNOSIS — I25118 Atherosclerotic heart disease of native coronary artery with other forms of angina pectoris: Secondary | ICD-10-CM

## 2023-05-14 DIAGNOSIS — E782 Mixed hyperlipidemia: Secondary | ICD-10-CM

## 2023-05-14 NOTE — Progress Notes (Signed)
Follow up visit  Subjective:   [name], female    DOB: Oct 25, 1965, 57 y.o.   MRN: [id number]     HPI  Chief Complaint  Patient presents with   Coronary Artery Disease   Follow-up    6 month   Dizziness    57 y/o African-American female with hypertension, type 2 diabetes mellitus, CAD, postmenopausal vaginal bleeding, anemia  For last 3-4 days, patient has had pleuritic chest pain. She denies exertional chest pain, did very well with cardiac rehab.     Current Outpatient Medications:    acetaminophen (TYLENOL) 325 MG tablet, Take 650 mg by mouth every 6 (six) hours as needed (pain.)., Disp: , Rfl:    amLODipine (NORVASC) 10 MG tablet, TAKE 1 TABLET(10 MG) BY MOUTH EVERY EVENING, Disp: 90 tablet, Rfl: 0   aspirin 81 MG chewable tablet, Chew 81 mg by mouth in the morning., Disp: , Rfl:    atorvastatin (LIPITOR) 40 MG tablet, Take 1 tablet (40 mg total) by mouth every evening., Disp: 90 tablet, Rfl: 3   Ferrous Sulfate (IRON) 325 (65 FE) MG TABS, Take 325 mg by mouth 2 (two) times daily., Disp: , Rfl:    furosemide (LASIX) 40 MG tablet, Take 40 mg by mouth as needed., Disp: , Rfl:    isosorbide mononitrate (IMDUR) 30 MG 24 hr tablet, Take 1 tablet (30 mg total) by mouth daily., Disp: 90 tablet, Rfl: 1   losartan (COZAAR) 25 MG tablet, Take 1 tablet (25 mg total) by mouth daily., Disp: 90 tablet, Rfl: 1   metFORMIN (GLUCOPHAGE) 1000 MG tablet, Take 500 mg by mouth 2 (two) times daily with a meal., Disp: , Rfl:    metoprolol succinate (TOPROL-XL) 100 MG 24 hr tablet, Take 1 tablet (100 mg total) by mouth daily. Take with or immediately following a meal., Disp: 90 tablet, Rfl: 1   nitroGLYCERIN (NITROSTAT) 0.4 MG SL tablet, Place 1 tablet (0.4 mg total) under the tongue every 5 (five) minutes as needed for chest pain., Disp: 30 tablet, Rfl: 0   ranolazine (RANEXA) 1000 MG SR tablet, Take 1 tablet (1,000 mg total) by mouth 2 (two) times daily., Disp: 60 tablet, Rfl: 1    REPATHA SURECLICK 140 MG/ML SOAJ, ADMINISTER 1 ML UNDER THE SKIN EVERY 14 DAYS, Disp: 2 mL, Rfl: 5   Semaglutide (OZEMPIC, 0.25 OR 0.5 MG/DOSE, [place]), Inject 0.5 mg into the skin every Sunday., Disp: , Rfl:    ticagrelor (BRILINTA) 90 MG TABS tablet, Take 1 tablet (90 mg total) by mouth 2 (two) times daily., Disp: 60 tablet, Rfl: 6   Cardiovascular & other pertient studies:  Reviewed external labs and tests, independently interpreted  EKG 05/14/2023: Sinus rhythm 92 bpm  Nonspecific T-abnormality   Coronary intervention 10/20/2022: LM: Normal LAD: Mild mid LAD, diag 2 disease LCx: Ostial 40%, mid 70%, small OM 60% stenoses RCA: Prox 80% stenoses (serial progression). Mid overlapping stents with mid 50% ISR, RPL focal 70% stenosis   Normal LVEDP      Successful percutaneous coronary intervention ostial to mid RCA        PTCA and stent placement 3.0 X 28 mm Synergy drug-eluting stent        Postdilatation using 3.5 guide and 4.0 mm [place] balloons in serial fashion from mid to ostial RCA, up to 18 atm IVUS MSA 5 mm2, MLA 10 mm2  Recent labs: 10/15/2022: Glucose 152, BUN/Cr 16/1.25. EGFR 51. Na/K 139/3.7.  BNP 18 Trop HS 3  H/H 11/35. MCV 74. Platelets 334 HbA1C 6.1% Chol 87, TG 68, HDL 42, LDL 30  05/27/2022: Chol 143, TG 69, HDL 35, LDL 94  04/02/2022: Glucose 242, BUN/Cr 17/0.96. EGFR >60. Na/K 139/4.1.  H/H 8.2/25. MCV 76. Platelets 315 HbA1C 8.7% Lipid panel NA Lipoprotein a 196  Latest Reference Range & Units 04/02/22 15:48 04/02/22 16:08  Troponin I (High Sensitivity) <18 ng/L 11 10     Review of Systems  Cardiovascular:  Positive for chest pain. Negative for dyspnea on exertion, leg swelling, palpitations and syncope.  Respiratory:  Negative for shortness of breath.        Vitals:   05/14/23 1411 05/14/23 1412  BP:    Pulse:    Resp:    SpO2: 98% 100%   Orthostatic VS for the past 72 hrs (Last 3 readings):  Orthostatic BP Patient Position BP Location Cuff Size  Orthostatic Pulse  05/14/23 1412 144/90 Sitting Left Arm Large 87  05/14/23 1411 125/82 Supine Left Arm Large 90  05/14/23 1404 -- Sitting Left Arm Large --     Body mass index is 39.69 kg/m. Filed Weights   05/14/23 1404  Weight: 217 lb (98.4 kg)      Objective:   Physical Exam Vitals and nursing note reviewed.  Constitutional:      General: She is not in acute distress. Neck:     Vascular: No JVD.  Cardiovascular:     Rate and Rhythm: Normal rate and regular rhythm.     Heart sounds: Normal heart sounds. No murmur heard. Pulmonary:     Effort: Pulmonary effort is normal.     Breath sounds: Normal breath sounds. No wheezing or rales.  Musculoskeletal:     Right lower leg: No edema.     Left lower leg: No edema.             Visit diagnoses: No diagnosis found.     Assessment & Recommendations:   57 y/o African-American female with hypertension, type 2 diabetes mellitus, CAD, postmenopausal vaginal bleeding, anemia  CAD: Anterior, apical, and lateral walls of the left ventricle, consistent with myocardial ischemia. (07/2022). While this would suggest possible LAD disease, no significant LAD disease on noted on cath (07/2022). Complex CAD with residual severe prox RCA disease, mid RCA ISR, severe OM1 disease. Discussed in heart team meeting on 07/31/2022. No targets for CABG. OM1 too small to have sustainable PCI solution. S/p ostial-prox PCI to RCA (10/2022) Continue Crestor and Repatha. Completed 6 month DAPT. Stop Brilinta, continue Aspirin. Chest pain now is pleuritic and not anginal. Check chest Xray. F/u in 3-4 weeks  Mixed hyperlipidemia: As above  Hypertension: Well-controlled.  F/u in 6 months     Elder Negus, MD Pager: [phone] Office: [phone]

## 2023-05-19 ENCOUNTER — Ambulatory Visit
Admission: RE | Admit: 2023-05-19 | Discharge: 2023-05-19 | Disposition: A | Discharge status: Home / Self Care | Payer: Medicaid Other | Source: Ambulatory Visit | Attending: Cardiology | Admitting: Cardiology

## 2023-05-19 DIAGNOSIS — M79644 Pain in right finger(s): Secondary | ICD-10-CM | POA: Insufficient documentation

## 2023-05-19 DIAGNOSIS — R0781 Pleurodynia: Secondary | ICD-10-CM

## 2023-05-28 ENCOUNTER — Telehealth: Payer: Self-pay

## 2023-05-28 NOTE — Telephone Encounter (Signed)
Vmt pt requesting call back to notify of change of start of PREP to 8/27 and to offer intake on 06/13/23 at 130p. Request call back to confirm

## 2023-06-01 ENCOUNTER — Other Ambulatory Visit: Payer: Self-pay | Admitting: Obstetrics and Gynecology

## 2023-06-01 DIAGNOSIS — R928 Other abnormal and inconclusive findings on diagnostic imaging of breast: Secondary | ICD-10-CM

## 2023-06-04 LAB — COMPREHENSIVE METABOLIC PANEL: EGFR: 44

## 2023-06-08 ENCOUNTER — Telehealth: Payer: Self-pay

## 2023-06-08 NOTE — Telephone Encounter (Signed)
VMT pt requesting call back to confirm starting PREP on 8/27 and to scheduled intake

## 2023-06-10 ENCOUNTER — Ambulatory Visit
Admission: RE | Admit: 2023-06-10 | Discharge: 2023-06-10 | Disposition: A | Discharge status: Home / Self Care | Payer: Medicaid Other | Source: Ambulatory Visit | Attending: Obstetrics and Gynecology | Admitting: Obstetrics and Gynecology

## 2023-06-10 ENCOUNTER — Other Ambulatory Visit: Payer: Self-pay | Admitting: Obstetrics and Gynecology

## 2023-06-10 DIAGNOSIS — R928 Other abnormal and inconclusive findings on diagnostic imaging of breast: Secondary | ICD-10-CM

## 2023-06-10 DIAGNOSIS — N6320 Unspecified lump in the left breast, unspecified quadrant: Secondary | ICD-10-CM

## 2023-06-14 ENCOUNTER — Ambulatory Visit: Payer: Medicaid Other | Admitting: Cardiology

## 2023-06-18 ENCOUNTER — Other Ambulatory Visit: Payer: Self-pay | Admitting: Cardiology

## 2023-06-24 ENCOUNTER — Encounter: Payer: Medicaid Other | Admitting: *Deleted

## 2023-06-24 VITALS — BP 148/72 | HR 88 | Temp 98.2°F | Resp 18 | Ht 62.0 in | Wt 216.9 lb

## 2023-06-24 DIAGNOSIS — Z006 Encounter for examination for normal comparison and control in clinical research program: Secondary | ICD-10-CM

## 2023-06-24 NOTE — Progress Notes (Signed)
Very delightful AA female here for enrollment in AA heart.   She sees Dr. Rosemary Holms, and has extensive disease.  Prior multiple PCIs.  Here for Lp(a) screening for known underlying heart disease.  Last PCI in January 2024.  No current chest pain.   Objectives of AA Heart registry described to the patient in detail.  She consents to proceed.    Exam Alert, oriented female in NAD BP 148/72 Important   (BP Location: Left Arm)   Pulse 88   Temp 98.2 F (36.8 C)   Resp 18   Ht 5\' 2"  (1.575 m)   Wt 216 lb 14.9 oz (98.4 kg)   LMP 06/16/2019   SpO2 100%   BMI 39.68 kg/m   BSA 2.07 m  No carotid bruits or JVD Lungs clear Cor regular Abdomen with out HS megaly Ext  - trace edema    Impression Known CAD sp PCI (see report) Hyperlidemia Hypertension Type II DM Hx anemia  Plan  Enroll in AA Heart registry Labwork Drawn.    Arturo Morton. Riley Kill, MD [facility]

## 2023-06-24 NOTE — Research (Addendum)
AA HEART Informed Consent   Subject Name: [name]  Subject met inclusion and exclusion criteria.  The informed consent form, study requirements and expectations were reviewed with the subject and questions and concerns were addressed prior to the signing of the consent form.  The subject verbalized understanding of the trial requirements.  The subject agreed to participate in the AA HEART  trial and signed the informed consent at 09:41 AM on 06-24-2023.  The informed consent was obtained prior to performance of any protocol-specific procedures for the subject.  A copy of the signed informed consent was given to the subject and a copy was placed in the subject's medical record.   [place] Ikechukwu Cerny, Research Coordinator    Are there any labs that are clinically significant?  Yes []  OR No[]   Please FORWARD back to me with any changes or follow up!    ACCESSION NO. [id number]                                             Page 1 of 1                                                        INVESTIGATOR: ([id number])                          PROTOCOL   [id number]                     Thomasene Ripple, M.D.                              INVESTIGATOR NO.: [id number]                     c/o [facility]                         SUBJECT NUMBER: [id number]                     [employer] [facility]                 SUBJECT INITIALS NOT COLLECTED:                     [address]                          VISIT: D0                     [place], [place] [address]                   SPONSOR REPORT TO:                 COLLECTION TIME:10:45 DATE:24-Jun-2023                     Cruzita Lederer                 DATE RECEIVED IN LABORATORY:  25-Jun-2023                     c/o Sponsor [employer] access         DATE REPORTED BY LABORATORY: 25-Jun-2023                     [place]                          SEX: F  AGE: 57y                     [id number]                   [place], [place]  [phone]                                                                                        Ref. Ranges               Clinical    Comments                                                                          Significance                                                                            Yes*  No                    HEMATOLOGY&DIFFERENTIAL PANEL                      HGB            11.3    L    11.6-16.4 g/dL                      HCT            35           34-48 %                      RBC            4.4          4.1-5.6 x106/uL                      MCH            26           26-34 pg  MCHC           33           31-38 g/dL                      RDW            15.9    H    12.0-15.0 %                      RBC Morph      No Review Required                        MCV            79           79-98 fL                      WBC            3.88         3.80-10.70 x103/uL                      Neutrophil     2.54         1.96-7.23 x103/uL                      Lymphocyte     1.11         0.91-4.28 x103/uL                      Monocytes      0.18         0.12-0.92 x103/uL                      Eosinophil     0.04         0.00-0.57 x103/uL                      Basophils      0.02         0.00-0.20 x103/uL                      Neutrophil     65.4         40.5-75.0 %                      Lymphocyte     28.6         15.4-48.5%                      Monocytes      4.5          2.6-10.1 %                      Eosinophil     0.9          0.0-6.8 %                      Basophils      0.5          0.0-2.0 %                      Platelets      247  140-400 x103/uL  RETICULOCYTES                      Retic %        1.2          0.6-2.5 %                      Retic Abs      0.051        0.030-0.120 x106/uL   ANC                      ANC            2.54         1.96-7.23 x103/uL                    HBA1C                      Fast HbA1c     6.6     H    <6.5%  [id number]  LPA COLLECTION D/T                      Untimed D      24-Jun-2023                        Untimed T      10:45                            SM/PLASMA PROTEO & BMS D/T                      Untimed D      24-Jun-2023                        Untimed T      10:45                            SM/WB DNA COLLECTION D/T                      Untimed D      24-Jun-2023                        Untimed T      10:45                            SM/WB PAXGENE RNA COLLECT D/T                      Untimed D      24-Jun-2023                        Untimed T      10:45                            SUBJECT FASTED AT LEAST 9 HRS?                      Pt fast 9h     No

## 2023-07-15 ENCOUNTER — Ambulatory Visit: Payer: Medicaid Other | Admitting: Cardiology

## 2023-07-21 ENCOUNTER — Other Ambulatory Visit: Payer: Self-pay | Admitting: Cardiology

## 2023-07-21 DIAGNOSIS — E785 Hyperlipidemia, unspecified: Secondary | ICD-10-CM

## 2023-07-21 DIAGNOSIS — E782 Mixed hyperlipidemia: Secondary | ICD-10-CM

## 2023-07-21 DIAGNOSIS — I25118 Atherosclerotic heart disease of native coronary artery with other forms of angina pectoris: Secondary | ICD-10-CM

## 2023-08-03 NOTE — Progress Notes (Addendum)
Cardiology Office Note    Patient Name: [name] Date of Encounter: 08/04/2023  Primary Care Provider:  [facility] Primary Cardiologist:  None Primary Electrophysiologist: None   Past Medical History    Past Medical History:  Diagnosis Date   Anemia    CAD (coronary artery disease)    PCI with stent to RCA in 2019   CHF (congestive heart failure) (HCC) 04/2011   EF 15-20% from echo 05/19/11   DOE (dyspnea on exertion)    DVT (deep venous thrombosis) (HCC) 06/2011   LLE   Fatigue    HTN (hypertension)    Hyperlipidemia    Menorrhagia    with iron deficient anemia   NSTEMI (non-ST elevated myocardial infarction) (HCC) 10/26/2017   NSTEMI (non-ST elevated myocardial infarction) (HCC) 10/27/2017   Obesity    Orthopnea    Type II diabetes mellitus (HCC)     History of Present Illness  [name] is a 57 y.o. female with a PMH of CAD s/p NSTEMI with multiple PCI's and last intervention 10/20/2022 PTCA/stent to proximal RCA and 50% ISR on combined systolic diastolic CHF, HTN DVT DM type II who presents today for follow-up of chest pain.  [name] was seen initially in 2012 acute on chronic CHF and improved with diuresis.  2D echo was completed showing reduced EF of 15-20% and patient was placed on GDMT.  She was diagnosed with a right lower extremity DVT in 07/2011 and was placed on Coumadin.  She was seen in the ED in 2013 with complaint of chest pain with unremarkable troponins and no ACS on EKG.  She was set up for outpatient stress test that was abnormal severe wall ischemia and outpatient LHC that revealed blockage to RCA which was treated with DES x 1 to rule severe disease in circumflex and OM.  She was seen back in the ED for chest pain in 10/2017 for NSTEMI with successful PCI to mid RCA.  She was seen again in 10/2019 with chest pain and underwent LHC which revealed unchanged coronary anatomy with patent RCA stent and no ISR.  She  was placed on aggressive medical management encouraged lifestyle modifications.  She presented to the ED 03/10/2022 with complaint of chest pain and was found to have mildly elevated troponin and had NSTEMI with PCI to mid RCA but unable to maintain catheter guidance repair severe stenosis and OM1.  She therefore underwent intervention on 05/26/2022 angioplasty and OM1.  [name] was seen 07/25/2022 with chest pain and arm pain and describes substernal discomfort with ambulation.  She reported the discomfort being similar to her NSTEMI in 02/2022.  Her workup in the ED showed negative troponins x 2 and EKG shows sinus rhythm with intermittent ST changes.  She underwent a 2-day stress test that revealed a large reversible perfusion defect throughout anterior, apical and lateral walls consistent with MI.  She had a left heart cath completed 07/27/2022 by Dr. Jacinto Halim which showed an mid RCA in-stent restenosis with patent proximal to distal stent along with restenosis in the proximal and mid segment of left circumflex with no intervention at that time and was placed on Ranexa.  Dr. Jacinto Halim presented the patient at the heart care team with recommendation of PCI to proximal RCA and mid RCA if she continued to suffer angina on optimal therapy.  She presented back to the ED on 10/20/2022 with complaint of chest pain patient underwent an IVUS guided PCI  to ostial to mid RCA DES x 1 and same-day discharge.  She presents today for complaint of recurrent chest pain.  During today's visit the patient reports that she had experienced chest pain a few months after her latest intervention.  Patient's blood pressure today was controlled at 124/86 and heart rate was 80 bpm.  During today's visit she is chest pain-free and reports her last episode of chest pain in August of this year.  She reports that pain was aggravated with emotional upset and also experienced shortness of breath.  She is staying active and is currently walking 30  minutes a day and has lost a total of 54 pounds with Ozempic.  She has been using her Lasix as needed once to twice a week.  She is otherwise euvolemic on examination today.  Patient denies chest pain, palpitations, dyspnea, PND, orthopnea, nausea, vomiting, dizziness, syncope, edema, weight gain, or early satiety.  Review of Systems  Please see the history of present illness.    All other systems reviewed and are otherwise negative except as noted above.  Physical Exam    Wt Readings from Last 3 Encounters:  06/24/23 216 lb 14.9 oz (98.4 kg)  05/14/23 217 lb (98.4 kg)  04/09/23 220 lb 3.8 oz (99.9 kg)   WU:JWJXB were no vitals filed for this visit.,There is no height or weight on file to calculate BMI. GEN: Well nourished, well developed in no acute distress Neck: No JVD; No carotid bruits Pulmonary: Clear to auscultation without rales, wheezing or rhonchi  Cardiovascular: Normal rate. Regular rhythm. Normal S1. Normal S2.   Murmurs: There is no murmur.  ABDOMEN: Soft, non-tender, non-distended EXTREMITIES:  No edema; No deformity   EKG/LABS/ Recent Cardiac Studies   ECG personally reviewed by me today -none completed today  Risk Assessment/Calculations:          Lab Results  Component Value Date   WBC 5.5 10/15/2022   HGB 11.1 (L) 10/15/2022   HCT 35.3 (L) 10/15/2022   MCV 74.9 (L) 10/15/2022   PLT 334 10/15/2022   Lab Results  Component Value Date   CREATININE 1.25 (H) 10/15/2022   BUN 16 10/15/2022   NA 139 10/15/2022   K 3.7 10/15/2022   CL 108 10/15/2022   CO2 19 (L) 10/15/2022   Lab Results  Component Value Date   CHOL 87 (L) 08/27/2022   HDL 42 08/27/2022   LDLCALC 30 08/27/2022   LDLDIRECT 67 07/27/2022   TRIG 68 08/27/2022   CHOLHDL 2.1 08/27/2022    Lab Results  Component Value Date   HGBA1C 8.7 (H) 03/10/2022   Assessment & Plan    1.  Coronary artery disease/chest pain: -s/p multiple PCI's with no targets for bypass and most recent IVUS  guided PCI to mid RCA with DES x 1  -Today patient reports no chest pain or anginal equivalent today but has experienced episodes following her latest intervention. -We will add 30 mg as needed dose of Imdur for breakthrough angina -Continue GDMT with Lipitor 40 mg daily, Repatha 140 mg q. 14 days ASA 81 mg Imdur 30 mg, Ranexa 1000 mg twice daily  2.  Essential hypertension: -Patient's blood pressure today was stable at 124/86 -Continue Norvasc 10 mg daily, losartan 25 mg daily Toprol-XL 100 mg daily -Patient will obtain BP cuff and monitor blood pressures at home. -BMET today  3.  Hyperlipidemia: -Patient's last LDL cholesterol was 30 on 14/7829 -Continue Repatha 140 mg q. 14 days and Lipitor  40 mg daily  4.  DM type II: -Patient's last hemoglobin A1c was 8.7 -Is followed by PCP and recently began Ozempic with 54 pound weight loss.  5.  Obesity: -Patient's current BMI is 38.96 kg/m -She was recently placed on GLP-1 and has lost a total of 54 pounds -Continue low-sodium heart healthy diet.  Disposition: Follow-up with None or APP in 6 months    Signed, Napoleon Form, Leodis Rains, NP 08/04/2023, 10:52 AM [facility]

## 2023-08-05 ENCOUNTER — Ambulatory Visit: Payer: Medicaid Other | Attending: Cardiology | Admitting: Nurse Practitioner

## 2023-08-05 ENCOUNTER — Ambulatory Visit: Payer: Medicaid Other | Admitting: Nurse Practitioner

## 2023-08-05 ENCOUNTER — Encounter: Payer: Self-pay | Admitting: Nurse Practitioner

## 2023-08-05 VITALS — BP 124/86 | HR 80 | Resp 16 | Ht 62.0 in | Wt 213.0 lb

## 2023-08-05 DIAGNOSIS — E782 Mixed hyperlipidemia: Secondary | ICD-10-CM

## 2023-08-05 DIAGNOSIS — I25118 Atherosclerotic heart disease of native coronary artery with other forms of angina pectoris: Secondary | ICD-10-CM | POA: Diagnosis not present

## 2023-08-05 DIAGNOSIS — R072 Precordial pain: Secondary | ICD-10-CM | POA: Diagnosis not present

## 2023-08-05 DIAGNOSIS — I10 Essential (primary) hypertension: Secondary | ICD-10-CM

## 2023-08-05 MED ORDER — BLOOD PRESSURE CUFF MISC: 1.0000 | Freq: Every day | 0 refills | Status: AC

## 2023-08-05 MED ORDER — ISOSORBIDE MONONITRATE ER 30 MG PO TB24: 30.0000 mg | ORAL_TABLET | Freq: Every day | ORAL | 0 refills | Status: DC

## 2023-08-05 NOTE — Patient Instructions (Addendum)
Medication Instructions:  Can take an additional 30mg  of Imdur as needed YOU Take Mucinex in the blue box (not the DM)  *If you need a refill on your cardiac medications before your next appointment, please call your pharmacy*   Lab Work: TODAY-BMET If you have labs (blood work) drawn today and your tests are completely normal, you will receive your results only by: MyChart Message (if you have MyChart) OR A paper copy in the mail If you have any lab test that is abnormal or we need to change your treatment, we will call you to review the results.   Testing/Procedures: NONE ORDERED   Follow-Up: At [facility], you and your health needs are our priority.  As part of our continuing mission to provide you with exceptional heart care, we have created designated Provider Care Teams.  These Care Teams include your primary Cardiologist (physician) and Advanced Practice Providers (APPs -  Physician Assistants and Nurse Practitioners) who all work together to provide you with the care you need, when you need it.  We recommend signing up for the patient portal called "MyChart".  Sign up information is provided on this After Visit Summary.  MyChart is used to connect with patients for Virtual Visits (Telemedicine).  Patients are able to view lab/test results, encounter notes, upcoming appointments, etc.  Non-urgent messages can be sent to your provider as well.   To learn more about what you can do with MyChart, go to [email or web address].    Your next appointment:   4 month(s)  Provider:   Truett Mainland, MD    Other Instructions: Please check your weight daily. Please contact the office if you gain more than 2lbs in a day or 5lbs in a week.  Limit your salt intake to 1500-2000mg  per day or 500mg  of Sodium per meal.   Check your blood pressure daily for 2 weeks, then contact the office with your readings.  Make sure to check 2 hours after your medications.   AVOID these  things for 30 minutes before checking your blood pressure: No Drinking caffeine. No Drinking alcohol. No Eating. No Smoking. No Exercising.  Five minutes before checking your blood pressure: Pee. Sit in a dining chair. Avoid sitting in a soft couch or armchair. Be quiet. Do not talk.

## 2023-08-06 LAB — BASIC METABOLIC PANEL
BUN/Creatinine Ratio: 14 (ref 9–23)
BUN: 13 mg/dL (ref 6–24)
CO2: 24 mmol/L (ref 20–29)
Calcium: 9.7 mg/dL (ref 8.7–10.2)
Chloride: 103 mmol/L (ref 96–106)
Creatinine, Ser: 0.91 mg/dL (ref 0.57–1.00)
Glucose: 109 mg/dL — ABNORMAL HIGH (ref 70–99)
Potassium: 4.8 mmol/L (ref 3.5–5.2)
Sodium: 143 mmol/L (ref 134–144)
eGFR: 75 mL/min/{1.73_m2} (ref >59)

## 2023-08-16 ENCOUNTER — Other Ambulatory Visit (HOSPITAL_COMMUNITY): Payer: Self-pay

## 2023-08-16 ENCOUNTER — Telehealth: Payer: Self-pay | Admitting: Pharmacy Technician

## 2023-08-16 NOTE — Telephone Encounter (Signed)
Pharmacy Patient Advocate Encounter  Received notification from [facility] that Prior Authorization for repatha has been APPROVED from 08/16/23 to 08/15/24. Ran test claim, Copay is $4.00- one month. This test claim was processed through [facility]- copay amounts may vary at other pharmacies due to pharmacy/plan contracts, or as the patient moves through the different stages of their insurance plan.   PA #/Case ID/Reference #: [id number]

## 2023-08-16 NOTE — Telephone Encounter (Signed)
Pharmacy Patient Advocate Encounter   Received notification from Fax that prior authorization for repatha is required/requested.   Insurance verification completed.   The patient is insured through [facility] .   Per test claim: PA required; PA submitted to [facility] via CoverMyMeds Key/confirmation #/EOC [facility] Status is pending

## 2023-08-27 ENCOUNTER — Ambulatory Visit
Admission: RE | Admit: 2023-08-27 | Discharge: 2023-08-27 | Disposition: A | Discharge status: Home / Self Care | Payer: Medicaid Other | Source: Ambulatory Visit | Attending: Obstetrics and Gynecology | Admitting: Obstetrics and Gynecology

## 2023-08-27 DIAGNOSIS — N6320 Unspecified lump in the left breast, unspecified quadrant: Secondary | ICD-10-CM

## 2023-08-27 DIAGNOSIS — R928 Other abnormal and inconclusive findings on diagnostic imaging of breast: Secondary | ICD-10-CM

## 2023-10-18 ENCOUNTER — Telehealth: Payer: Self-pay | Admitting: *Deleted

## 2023-10-18 NOTE — Telephone Encounter (Signed)
Called patient to give her test results for research study that she is enrolled in. Left message for patient to call back.  [place] Kreston Ahrendt, Research Coordinator 10/18/2023  13:18 pm

## 2023-10-27 ENCOUNTER — Encounter: Payer: Self-pay | Admitting: Emergency Medicine

## 2023-10-27 ENCOUNTER — Ambulatory Visit
Admission: EM | Admit: 2023-10-27 | Discharge: 2023-10-27 | Disposition: A | Discharge status: Home / Self Care | Payer: Medicaid Other

## 2023-10-27 DIAGNOSIS — M545 Low back pain, unspecified: Secondary | ICD-10-CM

## 2023-10-27 MED ORDER — CYCLOBENZAPRINE HCL 10 MG PO TABS: 10.0000 mg | ORAL_TABLET | Freq: Two times a day (BID) | ORAL | 0 refills | Status: DC | PRN

## 2023-10-27 NOTE — ED Triage Notes (Signed)
 Pt reports L flank pain x3 days. Describes sharp, stabbing, and buring pain. Pain does not radiates and pt reports no known injury. No fevers, N/V, or urination changes reported.

## 2023-10-27 NOTE — ED Provider Notes (Signed)
 [facility]    CSN: [id number] Arrival date & time: 10/27/23  1723      History   Chief Complaint Chief Complaint  Patient presents with   Flank Pain    HPI [name] is a 58 y.o. female.   Patient here today for eval ration of left flank pain for 3-day.  She reports pain is sharp and stabbing reports she does note some burning sensation with pain.  She denies any known injury.  Pain does not radiate.  She denies any fever, nausea, vomiting or urinary changes.  She denies any hematuria.  She has not had any blood in her stool.  She denies any diarrhea or constipation.  She has not had shortness of breath.  She is taking Tylenol  without resolution.  The history is provided by the patient.  Flank Pain Pertinent negatives include no abdominal pain and no shortness of breath.    Past Medical History:  Diagnosis Date   Anemia    CAD (coronary artery disease)    PCI with stent to RCA in 2019   CHF (congestive heart failure) (HCC) 04/2011   EF 15-20% from echo 05/19/11   DOE (dyspnea on exertion)    DVT (deep venous thrombosis) (HCC) 06/2011   LLE   Fatigue    HTN (hypertension)    Hyperlipidemia    Menorrhagia    with iron  deficient anemia   NSTEMI (non-ST elevated myocardial infarction) (HCC) 10/26/2017   NSTEMI (non-ST elevated myocardial infarction) (HCC) 10/27/2017   Obesity    Orthopnea    Type II diabetes mellitus (HCC)     Patient Active Problem List   Diagnosis Date Noted   Coronary artery disease of native artery of native heart with stable angina pectoris (HCC)    Pleuritic pain 07/25/2022   History of non-ST elevation myocardial infarction (NSTEMI)    History of coronary angioplasty with insertion of stent    Mixed hyperlipidemia    Post PTCA 05/26/2022   Chronic iron  deficiency anemia 03/10/2022   Chronic diastolic CHF (congestive heart failure) (HCC) 03/10/2022   Non-insulin  dependent type 2 diabetes mellitus (HCC) 03/10/2022    Morbidly obese (HCC) 08/19/2020   Uncontrolled type 2 diabetes mellitus with hyperglycemia (HCC) 08/19/2020   Unstable angina (HCC) 11/16/2019   Non-ST elevation (NSTEMI) myocardial infarction (HCC) 10/27/2017   OSA (obstructive sleep apnea) 02/21/2013   Atherosclerosis of native coronary artery of native heart with stable angina pectoris (HCC) 11/18/2011   Dyslipidemia 11/18/2011   Precordial pain 11/17/2011   Abnormal cardiovascular stress test 11/17/2011   DVT (deep venous thrombosis) (HCC) 11/01/2011   Essential hypertension 11/01/2011   Blood loss anemia 11/01/2011    Past Surgical History:  Procedure Laterality Date   CORONARY ANGIOPLASTY WITH STENT PLACEMENT  2013;  10/27/2017   CORONARY BALLOON ANGIOPLASTY N/A 05/26/2022   Procedure: CORONARY BALLOON ANGIOPLASTY;  Surgeon: Elmira Newman PARAS, MD;  Location: [facility];  Service: Cardiovascular;  Laterality: N/A;   CORONARY STENT INTERVENTION N/A 10/27/2017   Procedure: CORONARY STENT INTERVENTION;  Surgeon: Elmira Newman PARAS, MD;  Location: [facility];  Service: Cardiovascular;  Laterality: N/A;   CORONARY STENT INTERVENTION N/A 03/10/2022   Procedure: CORONARY STENT INTERVENTION;  Surgeon: Ladona Heinz, MD;  Location: [facility];  Service: Cardiovascular;  Laterality: N/A;   CORONARY STENT INTERVENTION N/A 03/11/2022   Procedure: CORONARY STENT INTERVENTION;  Surgeon: Elmira Newman PARAS, MD;  Location: [facility];  Service:  Cardiovascular;  Laterality: N/A;  aborted   CORONARY STENT INTERVENTION N/A 10/20/2022   Procedure: CORONARY STENT INTERVENTION;  Surgeon: Elmira Newman PARAS, MD;  Location: [facility];  Service: Cardiovascular;  Laterality: N/A;   CORONARY ULTRASOUND/IVUS N/A 05/26/2022   Procedure: Intravascular Ultrasound/IVUS;  Surgeon: Elmira Newman PARAS, MD;  Location: [facility];  Service: Cardiovascular;  Laterality: N/A;   CORONARY ULTRASOUND/IVUS N/A 10/20/2022    Procedure: Intravascular Ultrasound/IVUS;  Surgeon: Elmira Newman PARAS, MD;  Location: [facility];  Service: Cardiovascular;  Laterality: N/A;   LEFT HEART CATH AND CORONARY ANGIOGRAPHY N/A 10/27/2017   Procedure: LEFT HEART CATH AND CORONARY ANGIOGRAPHY;  Surgeon: Elmira Newman PARAS, MD;  Location: [facility];  Service: Cardiovascular;  Laterality: N/A;   LEFT HEART CATH AND CORONARY ANGIOGRAPHY N/A 11/17/2019   Procedure: LEFT HEART CATH AND CORONARY ANGIOGRAPHY;  Surgeon: Elmira Newman PARAS, MD;  Location: [facility];  Service: Cardiovascular;  Laterality: N/A;   LEFT HEART CATH AND CORONARY ANGIOGRAPHY N/A 03/10/2022   Procedure: LEFT HEART CATH AND CORONARY ANGIOGRAPHY;  Surgeon: Ladona Heinz, MD;  Location: [facility];  Service: Cardiovascular;  Laterality: N/A;   LEFT HEART CATH AND CORONARY ANGIOGRAPHY N/A 07/27/2022   Procedure: LEFT HEART CATH AND CORONARY ANGIOGRAPHY;  Surgeon: Ladona Heinz, MD;  Location: [facility];  Service: Cardiovascular;  Laterality: N/A;   LEFT HEART CATH AND CORONARY ANGIOGRAPHY N/A 10/20/2022   Procedure: LEFT HEART CATH AND CORONARY ANGIOGRAPHY;  Surgeon: Elmira Newman PARAS, MD;  Location: [facility];  Service: Cardiovascular;  Laterality: N/A;   LEFT HEART CATHETERIZATION WITH CORONARY ANGIOGRAM N/A 11/17/2011   Procedure: LEFT HEART CATHETERIZATION WITH CORONARY ANGIOGRAM;  Surgeon: Erick JONELLE Ladona, MD;  Location: [facility];  Service: Cardiovascular;  Laterality: N/A;   OVARIAN CYST REMOVAL     TUBAL LIGATION  2006   ULTRASOUND GUIDANCE FOR VASCULAR ACCESS  10/27/2017   Procedure: Ultrasound Guidance For Vascular Access;  Surgeon: Elmira Newman PARAS, MD;  Location: [facility];  Service: Cardiovascular;;    OB History   No obstetric history on file.      Home Medications    Prior to Admission medications   Medication Sig Start Date End Date Taking? Authorizing Provider  ACCU-CHEK GUIDE TEST  test strip  10/26/23  Yes [provider]  Accu-Chek Softclix Lancets lancets 2 (two) times daily. 09/29/23  Yes [provider]  acetaminophen  (TYLENOL ) 325 MG tablet Take 650 mg by mouth every 6 (six) hours as needed (pain.).   Yes [provider]  amLODipine  (NORVASC ) 10 MG tablet TAKE 1 TABLET(10 MG) BY MOUTH EVERY EVENING 09/28/22  Yes Patwardhan, Manish J, MD  aspirin  81 MG chewable tablet Chew 81 mg by mouth in the morning.   Yes [provider]  atorvastatin  (LIPITOR ) 40 MG tablet Take 1 tablet (40 mg total) by mouth every evening. 05/27/22  Yes Patwardhan, Newman PARAS, MD  Blood Glucose Monitoring Suppl (ACCU-CHEK GUIDE ME) w/Device KIT See admin instructions. 10/04/23  Yes [provider]  Blood Pressure Monitoring (BLOOD PRESSURE CUFF) MISC 1 each by Does not apply route daily. 08/05/23  Yes Wyn Jackee VEAR Mickey., NP  cyclobenzaprine  (FLEXERIL ) 10 MG tablet Take 1 tablet (10 mg total) by mouth 2 (two) times daily as needed for muscle spasms. 10/27/23  Yes Billy Asberry FALCON, PA-C  Evolocumab  (REPATHA  SURECLICK) 140 MG/ML SOAJ ADMINISTER 1 ML UNDER THE SKIN  EVERY 14 DAYS 07/21/23  Yes Patwardhan, Manish J, MD  isosorbide  mononitrate (IMDUR ) 30 MG 24 hr tablet Take 1 tablet (30 mg total) by mouth daily. Can take an additional tab as needed 08/05/23 02/01/24 Yes Wyn Jackee VEAR Mickey., NP  JARDIANCE  10 MG TABS tablet Take 10 mg by mouth daily. 04/14/23  Yes [provider]  metFORMIN (GLUCOPHAGE) 500 MG tablet Take 500 mg by mouth 2 (two) times daily. 10/26/23  Yes [provider]  metoprolol  succinate (TOPROL -XL) 100 MG 24 hr tablet Take 1 tablet (100 mg total) by mouth daily. Take with or immediately following a meal. 09/28/22 10/27/23 Yes Patwardhan, Manish J, MD  OZEMPIC, 1 MG/DOSE, 4 MG/3ML SOPN SMARTSIG:1 Milligram(s) Topical Once a Week 09/29/23  Yes [provider]  amoxicillin -clavulanate (AUGMENTIN ) 875-125 MG tablet SMARTSIG:1 Tablet(s)  By Mouth Every 12 Hours Patient not taking: Reported on 10/27/2023 06/03/23   [provider]  ARLEENE COVID-19 AG HOME TEST KIT TEST AS DIRECTED TODAY Patient not taking: Reported on 10/27/2023 06/04/23   [provider]  Ferrous Sulfate  (IRON ) 325 (65 FE) MG TABS Take 325 mg by mouth 2 (two) times daily. Patient not taking: Reported on 10/27/2023    [provider]  furosemide  (LASIX ) 40 MG tablet Take 40 mg by mouth as needed. 08/27/22   [provider]  losartan  (COZAAR ) 25 MG tablet Take 1 tablet (25 mg total) by mouth daily. Patient not taking: Reported on 10/27/2023 09/28/22 08/05/23  Elmira Newman PARAS, MD  metFORMIN (GLUCOPHAGE) 1000 MG tablet Take 500 mg by mouth 2 (two) times daily with a meal. Patient not taking: Reported on 10/27/2023    [provider]  nitroGLYCERIN  (NITROSTAT ) 0.4 MG SL tablet PLACE 1 TABLET UNDER TONGUE EVERY 5 MINS AS NEEDED FOR CHEST PAIN 06/23/23   Patwardhan, Newman PARAS, MD  pantoprazole  (PROTONIX ) 40 MG tablet Take 40 mg by mouth daily. Patient not taking: Reported on 10/27/2023 05/19/23   [provider]  predniSONE (STERAPRED UNI-PAK 21 TAB) 5 MG (21) TBPK tablet Take by mouth as directed. Patient not taking: Reported on 10/27/2023 05/19/23   [provider]  ranolazine  (RANEXA ) 1000 MG SR tablet Take 1 tablet (1,000 mg total) by mouth 2 (two) times daily. Patient not taking: Reported on 10/27/2023 07/28/22 08/05/23  Perri DELENA Meliton Mickey., MD  Semaglutide (OZEMPIC, 0.25 OR 0.5 MG/DOSE, [place]) Inject 0.5 mg into the skin every Sunday. Patient not taking: Reported on 10/27/2023    [provider]  VENTOLIN  HFA 108 (90 Base) MCG/ACT inhaler SMARTSIG:2 Puff(s) By Mouth Every 4 Hours PRN 05/19/23   [provider]    Family History Family History  Problem Relation Age of Onset   Hypertension Mother    Stroke Father    Heart attack Sister    Stroke Sister     Social History Social History    Tobacco Use   Smoking status: Former    Current packs/day: 0.00    Average packs/day: 0.5 packs/day for 10.0 years (5.0 ttl pk-yrs)    Types: Cigarettes    Start date: 10/20/1983    Quit date: 10/19/1993    Years since quitting: 30.0   Smokeless tobacco: Never  Vaping Use   Vaping status: Never Used  Substance Use Topics   Alcohol use: No   Drug use: No     Allergies   Detrol [tolterodine tartrate]   Review of Systems Review of Systems  Constitutional:  Negative for chills and fever.  Eyes:  Negative for discharge and redness.  Respiratory:  Negative for shortness of breath.   Gastrointestinal:  Negative for abdominal pain, blood in stool, diarrhea, nausea and vomiting.  Genitourinary:  Positive for flank pain. Negative for dysuria, frequency and hematuria.     Physical Exam Triage Vital Signs ED Triage Vitals  Encounter Vitals Group     BP 10/27/23 1735 136/85     Systolic BP Percentile --      Diastolic BP Percentile --      Pulse Rate 10/27/23 1735 82     Resp 10/27/23 1735 16     Temp 10/27/23 1735 98.3 F (36.8 C)     Temp Source 10/27/23 1735 Oral     SpO2 10/27/23 1735 98 %     Weight --      Height --      Head Circumference --      Peak Flow --      Pain Score 10/27/23 1738 10     Pain Loc --      Pain Education --      Exclude from Growth Chart --    No data found.  Updated Vital Signs BP 136/85 (BP Location: Left Arm)   Pulse 82   Temp 98.3 F (36.8 C) (Oral)   Resp 16   LMP 06/16/2019   SpO2 98%   Visual Acuity Right Eye Distance:   Left Eye Distance:   Bilateral Distance:    Right Eye Near:   Left Eye Near:    Bilateral Near:     Physical Exam Vitals and nursing note reviewed.  Constitutional:      General: She is not in acute distress.    Appearance: Normal appearance. She is not ill-appearing.  HENT:     Head: Normocephalic and atraumatic.  Eyes:     Conjunctiva/sclera: Conjunctivae normal.  Cardiovascular:     Rate and  Rhythm: Normal rate.  Pulmonary:     Effort: Pulmonary effort is normal. No respiratory distress.  Musculoskeletal:     Comments: No tenderness to palpation to midline lumbar spine, mild tenderness palpation to left upper lower back and flank area  Neurological:     Mental Status: She is alert.  Psychiatric:        Mood and Affect: Mood normal.        Behavior: Behavior normal.        Thought Content: Thought content normal.      UC Treatments / Results  Labs (all labs ordered are listed, but only abnormal results are displayed) Labs Reviewed - No data to display  EKG   Radiology No results found.  Procedures Procedures (including critical care time)  Medications Ordered in UC Medications - No data to display  Initial Impression / Assessment and Plan / UC Course  I have reviewed the triage vital signs and the nursing notes.  Pertinent labs & imaging results that were available during my care of the patient were reviewed by me and considered in my medical decision making (see chart for details).     Suspect likely muscle strain given tenderness palpation and worsening with movement.  Lower suspicion for gastrointestinal cause given lack of other symptoms.  Recommended she continue to monitor and follow-up in the emergency room should symptoms worsen or fail to improve with muscle relaxer.  Discussed that muscle laxer may cause drowsiness and use with caution.  Will Final Clinical Impressions(s) / UC Diagnoses   Final diagnoses:  Acute left-sided low back pain without sciatica   Discharge Instructions   None    ED Prescriptions     Medication Sig Dispense Auth. Provider   cyclobenzaprine  (FLEXERIL ) 10 MG tablet Take 1 tablet (10 mg total) by mouth 2 (two) times daily as needed for muscle spasms. 20 tablet Billy Asberry FALCON, PA-C      PDMP not reviewed this encounter.   Billy Asberry FALCON, PA-C 10/27/23 1754

## 2023-11-02 ENCOUNTER — Telehealth: Payer: Self-pay | Admitting: *Deleted

## 2023-11-02 NOTE — Telephone Encounter (Signed)
 Called patient to give her lab result for AA heart study.    [place] Cynethia Schindler, Research Coordinator 11/02/2023

## 2023-12-08 ENCOUNTER — Other Ambulatory Visit (HOSPITAL_COMMUNITY): Payer: Self-pay

## 2023-12-08 ENCOUNTER — Encounter: Payer: Self-pay | Admitting: Cardiology

## 2023-12-08 ENCOUNTER — Ambulatory Visit: Payer: Medicaid Other | Attending: Cardiology | Admitting: Cardiology

## 2023-12-08 ENCOUNTER — Telehealth: Payer: Self-pay | Admitting: Pharmacist

## 2023-12-08 VITALS — BP 124/74 | HR 70 | Ht 62.0 in | Wt 213.0 lb

## 2023-12-08 DIAGNOSIS — I25118 Atherosclerotic heart disease of native coronary artery with other forms of angina pectoris: Secondary | ICD-10-CM

## 2023-12-08 DIAGNOSIS — I10 Essential (primary) hypertension: Secondary | ICD-10-CM | POA: Diagnosis not present

## 2023-12-08 DIAGNOSIS — R0609 Other forms of dyspnea: Secondary | ICD-10-CM | POA: Diagnosis not present

## 2023-12-08 DIAGNOSIS — E785 Hyperlipidemia, unspecified: Secondary | ICD-10-CM

## 2023-12-08 DIAGNOSIS — E782 Mixed hyperlipidemia: Secondary | ICD-10-CM

## 2023-12-08 DIAGNOSIS — E1165 Type 2 diabetes mellitus with hyperglycemia: Secondary | ICD-10-CM

## 2023-12-08 LAB — PRO B NATRIURETIC PEPTIDE: NT-Pro BNP: <36 pg/mL (ref 0–287)

## 2023-12-08 LAB — CBC
Hematocrit: 37.5 % (ref 34.0–46.6)
Hemoglobin: 11.8 g/dL (ref 11.1–15.9)
MCH: 24.5 pg — ABNORMAL LOW (ref 26.6–33.0)
MCHC: 31.5 g/dL (ref 31.5–35.7)
MCV: 78 fL — ABNORMAL LOW (ref 79–97)
Platelets: 336 10*3/uL (ref 150–450)
RBC: 4.81 x10E6/uL (ref 3.77–5.28)
RDW: 13.1 % (ref 11.7–15.4)
WBC: 3.6 10*3/uL (ref 3.4–10.8)

## 2023-12-08 LAB — BASIC METABOLIC PANEL
BUN/Creatinine Ratio: 13 (ref 9–23)
BUN: 13 mg/dL (ref 6–24)
CO2: 23 mmol/L (ref 20–29)
Calcium: 9.3 mg/dL (ref 8.7–10.2)
Chloride: 105 mmol/L (ref 96–106)
Creatinine, Ser: 1.02 mg/dL — ABNORMAL HIGH (ref 0.57–1.00)
Glucose: 95 mg/dL (ref 70–99)
Potassium: 4.2 mmol/L (ref 3.5–5.2)
Sodium: 143 mmol/L (ref 134–144)
eGFR: 65 mL/min/{1.73_m2} (ref >59)

## 2023-12-08 LAB — LIPID PANEL
Chol/HDL Ratio: 1.7 {ratio} (ref 0.0–4.4)
Cholesterol, Total: 98 mg/dL — ABNORMAL LOW (ref 100–199)
HDL: 59 mg/dL (ref >39)
LDL Chol Calc (NIH): 29 mg/dL (ref 0–99)
Triglycerides: 35 mg/dL (ref 0–149)
VLDL Cholesterol Cal: 10 mg/dL (ref 5–40)

## 2023-12-08 LAB — HEMOGLOBIN A1C
Est. average glucose Bld gHb Est-mCnc: 137 mg/dL
Hgb A1c MFr Bld: 6.4 % — ABNORMAL HIGH (ref 4.8–5.6)

## 2023-12-08 MED ORDER — FUROSEMIDE 40 MG PO TABS: 40.0000 mg | ORAL_TABLET | ORAL | 6 refills | Status: AC | PRN

## 2023-12-08 MED ORDER — ATORVASTATIN CALCIUM 40 MG PO TABS: 40.0000 mg | ORAL_TABLET | Freq: Every evening | ORAL | 3 refills | Status: AC

## 2023-12-08 MED ORDER — METOPROLOL SUCCINATE ER 100 MG PO TB24: 100.0000 mg | ORAL_TABLET | Freq: Every day | ORAL | 1 refills | Status: DC

## 2023-12-08 MED ORDER — JARDIANCE 10 MG PO TABS: 10.0000 mg | ORAL_TABLET | Freq: Every day | ORAL | 6 refills | Status: AC

## 2023-12-08 MED ORDER — RANOLAZINE ER 1000 MG PO TB12: 1000.0000 mg | ORAL_TABLET | Freq: Two times a day (BID) | ORAL | 1 refills | Status: DC

## 2023-12-08 MED ORDER — AMLODIPINE BESYLATE 10 MG PO TABS: 10.0000 mg | ORAL_TABLET | Freq: Every day | ORAL | 3 refills | Status: AC

## 2023-12-08 MED ORDER — LOSARTAN POTASSIUM 25 MG PO TABS: 25.0000 mg | ORAL_TABLET | Freq: Every day | ORAL | 1 refills | Status: DC

## 2023-12-08 MED ORDER — ASPIRIN 81 MG PO CHEW: 81.0000 mg | CHEWABLE_TABLET | Freq: Every morning | ORAL | 3 refills | Status: AC

## 2023-12-08 MED ORDER — ISOSORBIDE MONONITRATE ER 60 MG PO TB24: 60.0000 mg | ORAL_TABLET | Freq: Every day | ORAL | 2 refills | Status: DC

## 2023-12-08 MED ORDER — REPATHA SURECLICK 140 MG/ML ~~LOC~~ SOAJ
140.0000 mg | SUBCUTANEOUS | 11 refills | Status: DC
  Filled 2023-12-08: qty 2, 28d supply, fill #0

## 2023-12-08 NOTE — Telephone Encounter (Signed)
-----   Message from Nurse Corky Crafts sent at 12/08/2023  9:25 AM EST ----- Regarding: REFILL REPATHA Dr. Rosemary Holms saw this pt in clinic  Needs refill of repatha  Can you assist?  Thanks, Lajoyce Corners

## 2023-12-08 NOTE — Patient Instructions (Signed)
Medication Instructions:   INCREASE YOUR ISOSORBIDE MONONITRATE (IMDUR) TO 60 MG BY MOUTH DAILY  *If you need a refill on your cardiac medications before your next appointment, please call your pharmacy*   Lab Work:  TODAY--DOWNSTAIRS FIRST FLOOR AT [facility] IN THIS BUILDING--BMET, A1C, CBC, LIPIDS, AND PRO-BNP  If you have labs (blood work) drawn today and your tests are completely normal, you will receive your results only by: MyChart Message (if you have MyChart) OR A paper copy in the mail If you have any lab test that is abnormal or we need to change your treatment, we will call you to review the results.     Follow-Up:  8-12 WEEKS WITH DR. PATWARDHAN

## 2023-12-08 NOTE — Progress Notes (Signed)
Cardiology Office Note:  .   Date:  12/08/2023  ID:  [name], DOB [birth year], MRN [id number] PCP: Kathreen Cornfield, PA-C  [facility] Providers Cardiologist:  Truett Mainland, MD PCP: Kathreen Cornfield, PA-C  Chief Complaint  Patient presents with   Coronary artery disease of native artery of native heart wi   Follow-up      History of Present Illness: .    [name] is a 58 y.o. female with hypertension, type 2 diabetes mellitus, CAD, obesity  Patient is doing regular walks 30-40-minute.  She does report chest pain and shortness of breath with more than usual activity, especially when she is "rushing", that improves with rest and occasionally with sublingual nitroglycerin.  Pain and shortness of breath is nowhere as severe as it was before PCI in 10/2022.  She is compliant with her medical therapy.  Blood pressure is well-controlled.  On a separate note, she is seeking permanent disability.    Vitals:   12/08/23 0901  BP: 124/74  Pulse: 70  SpO2: 95%     ROS:  Review of Systems  Cardiovascular:  Positive for chest pain and dyspnea on exertion. Negative for leg swelling, palpitations and syncope.     Studies Reviewed: Courtney Kitchen        Independently interpreted 07/2023: Cr 0.91, eGFR 75  08/2022: Chol 87, TG 68, HDL 42, LDL 30 Hb 11.1  Coronary intervention 10/20/2022: LM: Normal LAD: Mild mid LAD, diag 2 disease LCx: Ostial 40%, mid 70%, small OM 60% stenoses RCA: Prox 80% stenoses (serial progression). Mid overlapping stents with mid 50% ISR, RPL focal 70% stenosis   Normal LVEDP      Successful percutaneous coronary intervention ostial to mid RCA        PTCA and stent placement 3.0 X 28 mm Synergy drug-eluting stent        Postdilatation using 3.5 guide and 4.0 mm [place] balloons in serial fashion from mid to ostial RCA, up to 18 atm IVUS MSA 5 mm2, MLA 10 mm2   Physical Exam:   Physical Exam Vitals and nursing note reviewed.   Constitutional:      General: She is not in acute distress. Neck:     Vascular: No JVD.  Cardiovascular:     Rate and Rhythm: Normal rate and regular rhythm.     Heart sounds: Normal heart sounds. No murmur heard. Pulmonary:     Effort: Pulmonary effort is normal.     Breath sounds: Normal breath sounds. No wheezing or rales.  Musculoskeletal:     Right lower leg: No edema.     Left lower leg: No edema.      VISIT DIAGNOSES:   ICD-10-CM   1. Coronary artery disease of native artery of native heart with stable angina pectoris (HCC)  I25.118     2. Exertional dyspnea  R06.09     3. Mixed hyperlipidemia  E78.2     4. Primary hypertension  I10     5. Uncontrolled type 2 diabetes mellitus with hyperglycemia (HCC)  E11.65        ASSESSMENT AND PLAN: .    [name] is a 58 y.o. female with hypertension, type 2 diabetes mellitus, CAD, postmenopausal vaginal bleeding, anemia   CAD: Multiple PCI's to RCA. Most recently s/p ostial-prox PCI to RCA (10/2022). Continue Aspirin, Crestor and Repatha. Prior stress test that showed anterior ischemia, that did not correlate with angiographic findings. She still has residual angina and  exertional dyspnea symptoms, nearly not as severe as they were before PCI in 10/2022. We discussed options of increasing isosorbide dose to 60 mg daily, or proceeding with PET/CT stress test and echocardiogram, and possibly repeat heart catheterization. Patient prefers medical therapy at this time. Increase isosorbide to 60 mg daily. Continue rest of the antianginal therapy, including metoprolol, hydralazine. Check CBC, BMP, lipid panel, proBNP today.   Mixed hyperlipidemia: As above   Hypertension: Well-controlled.  Type 2 diabetes mellitus: Will check A1c today.         No orders of the defined types were placed in this encounter.    F/u in 8-12 weeks  Signed, Elder Negus, MD

## 2023-12-09 ENCOUNTER — Encounter: Payer: Self-pay | Admitting: Cardiology

## 2023-12-10 ENCOUNTER — Other Ambulatory Visit: Payer: Medicaid Other

## 2023-12-13 ENCOUNTER — Other Ambulatory Visit (HOSPITAL_COMMUNITY): Payer: Self-pay

## 2024-01-08 ENCOUNTER — Other Ambulatory Visit: Payer: Self-pay | Admitting: Cardiology

## 2024-01-26 ENCOUNTER — Other Ambulatory Visit: Payer: Self-pay | Admitting: Physician Assistant

## 2024-01-26 DIAGNOSIS — R0989 Other specified symptoms and signs involving the circulatory and respiratory systems: Secondary | ICD-10-CM | POA: Insufficient documentation

## 2024-01-26 DIAGNOSIS — H6122 Impacted cerumen, left ear: Secondary | ICD-10-CM | POA: Insufficient documentation

## 2024-02-09 ENCOUNTER — Encounter: Payer: Self-pay | Admitting: Physician Assistant

## 2024-02-10 ENCOUNTER — Ambulatory Visit
Admission: RE | Admit: 2024-02-10 | Discharge: 2024-02-10 | Disposition: A | Discharge status: Home / Self Care | Source: Ambulatory Visit | Attending: Physician Assistant | Admitting: Physician Assistant

## 2024-02-10 DIAGNOSIS — R0989 Other specified symptoms and signs involving the circulatory and respiratory systems: Secondary | ICD-10-CM

## 2024-02-17 ENCOUNTER — Emergency Department (HOSPITAL_COMMUNITY)
Admission: EM | Admit: 2024-02-17 | Discharge: 2024-02-17 | Disposition: A | Discharge status: Home / Self Care | Attending: Emergency Medicine | Admitting: Emergency Medicine

## 2024-02-17 ENCOUNTER — Emergency Department (HOSPITAL_COMMUNITY)

## 2024-02-17 ENCOUNTER — Other Ambulatory Visit: Payer: Self-pay

## 2024-02-17 ENCOUNTER — Encounter (HOSPITAL_COMMUNITY): Payer: Self-pay

## 2024-02-17 DIAGNOSIS — Z79899 Other long term (current) drug therapy: Secondary | ICD-10-CM | POA: Diagnosis not present

## 2024-02-17 DIAGNOSIS — I509 Heart failure, unspecified: Secondary | ICD-10-CM | POA: Insufficient documentation

## 2024-02-17 DIAGNOSIS — E119 Type 2 diabetes mellitus without complications: Secondary | ICD-10-CM | POA: Diagnosis not present

## 2024-02-17 DIAGNOSIS — I2510 Atherosclerotic heart disease of native coronary artery without angina pectoris: Secondary | ICD-10-CM | POA: Diagnosis not present

## 2024-02-17 DIAGNOSIS — Z7984 Long term (current) use of oral hypoglycemic drugs: Secondary | ICD-10-CM | POA: Insufficient documentation

## 2024-02-17 DIAGNOSIS — R200 Anesthesia of skin: Secondary | ICD-10-CM | POA: Diagnosis present

## 2024-02-17 DIAGNOSIS — I110 Hypertensive heart disease with heart failure: Secondary | ICD-10-CM | POA: Insufficient documentation

## 2024-02-17 DIAGNOSIS — Z7982 Long term (current) use of aspirin: Secondary | ICD-10-CM | POA: Insufficient documentation

## 2024-02-17 LAB — CBC
HCT: 33.4 % — ABNORMAL LOW (ref 36.0–46.0)
Hemoglobin: 10.8 g/dL — ABNORMAL LOW (ref 12.0–15.0)
MCH: 25.2 pg — ABNORMAL LOW (ref 26.0–34.0)
MCHC: 32.3 g/dL (ref 30.0–36.0)
MCV: 77.9 fL — ABNORMAL LOW (ref 80.0–100.0)
Platelets: 285 10*3/uL (ref 150–400)
RBC: 4.29 MIL/uL (ref 3.87–5.11)
RDW: 14.4 % (ref 11.5–15.5)
WBC: 4.1 10*3/uL (ref 4.0–10.5)
nRBC: 0 % (ref 0.0–0.2)

## 2024-02-17 LAB — I-STAT CHEM 8, ED
BUN: 11 mg/dL (ref 6–20)
Calcium, Ion: 1.11 mmol/L — ABNORMAL LOW (ref 1.15–1.40)
Chloride: 109 mmol/L (ref 98–111)
Creatinine, Ser: 1.1 mg/dL — ABNORMAL HIGH (ref 0.44–1.00)
Glucose, Bld: 118 mg/dL — ABNORMAL HIGH (ref 70–99)
HCT: 34 % — ABNORMAL LOW (ref 36.0–46.0)
Hemoglobin: 11.6 g/dL — ABNORMAL LOW (ref 12.0–15.0)
Potassium: 3.9 mmol/L (ref 3.5–5.1)
Sodium: 144 mmol/L (ref 135–145)
TCO2: 22 mmol/L (ref 22–32)

## 2024-02-17 LAB — COMPREHENSIVE METABOLIC PANEL WITH GFR
ALT: 17 U/L (ref 0–44)
AST: 17 U/L (ref 15–41)
Albumin: 3.4 g/dL — ABNORMAL LOW (ref 3.5–5.0)
Alkaline Phosphatase: 88 U/L (ref 38–126)
Anion gap: 9 (ref 5–15)
BUN: 10 mg/dL (ref 6–20)
CO2: 22 mmol/L (ref 22–32)
Calcium: 8.7 mg/dL — ABNORMAL LOW (ref 8.9–10.3)
Chloride: 111 mmol/L (ref 98–111)
Creatinine, Ser: 1.08 mg/dL — ABNORMAL HIGH (ref 0.44–1.00)
GFR, Estimated: >60 mL/min (ref >60)
Glucose, Bld: 114 mg/dL — ABNORMAL HIGH (ref 70–99)
Potassium: 3.9 mmol/L (ref 3.5–5.1)
Sodium: 142 mmol/L (ref 135–145)
Total Bilirubin: 0.6 mg/dL (ref 0.0–1.2)
Total Protein: 6.7 g/dL (ref 6.5–8.1)

## 2024-02-17 LAB — DIFFERENTIAL
Abs Immature Granulocytes: 0.01 10*3/uL (ref 0.00–0.07)
Basophils Absolute: 0 10*3/uL (ref 0.0–0.1)
Basophils Relative: 1 %
Eosinophils Absolute: 0.1 10*3/uL (ref 0.0–0.5)
Eosinophils Relative: 1 %
Immature Granulocytes: 0 %
Lymphocytes Relative: 23 %
Lymphs Abs: 1 10*3/uL (ref 0.7–4.0)
Monocytes Absolute: 0.4 10*3/uL (ref 0.1–1.0)
Monocytes Relative: 9 %
Neutro Abs: 2.8 10*3/uL (ref 1.7–7.7)
Neutrophils Relative %: 66 %

## 2024-02-17 LAB — RAPID URINE DRUG SCREEN, HOSP PERFORMED
Amphetamines: NOT DETECTED
Barbiturates: NOT DETECTED
Benzodiazepines: NOT DETECTED
Cocaine: NOT DETECTED
Opiates: NOT DETECTED
Tetrahydrocannabinol: NOT DETECTED

## 2024-02-17 LAB — PROTIME-INR
INR: 0.9 (ref 0.8–1.2)
Prothrombin Time: 12.6 s (ref 11.4–15.2)

## 2024-02-17 LAB — ETHANOL: Alcohol, Ethyl (B): <15 mg/dL (ref <15)

## 2024-02-17 LAB — APTT: aPTT: 30 s (ref 24–36)

## 2024-02-17 MED ORDER — LORAZEPAM 1 MG PO TABS: 0.5000 mg | ORAL_TABLET | Freq: Once | ORAL | Status: DC

## 2024-02-17 NOTE — ED Provider Triage Note (Signed)
 Emergency Medicine Provider Triage Evaluation Note  [name] , a 58 y.o. female  was evaluated in triage.  Pt complains of left arm weakness, left side face/arm diminished sensation. Last known well 11pm last night (14 hours PTA). Woke up at 2am with symptoms, went back to bed at 4am. Woke up with on going symptoms and went to PCP who sent to the ER.  No changes in vision/speech/gait.   Review of Systems  Positive:  Negative:   Physical Exam  LMP 06/16/2019  Gen:   Awake, no distress   Resp:  Normal effort  MSK:   Left arm weakness, diminished sensation on left side face and left arm drift. No facial asymmetry  Other:    Medical Decision Making  Medically screening exam initiated at 1:06 PM.  Appropriate orders placed.  [name] was informed that the remainder of the evaluation will be completed by another provider, this initial triage assessment does not replace that evaluation, and the importance of remaining in the ED until their evaluation is complete.     Darlis Eisenmenger, PA-C 02/17/24 1330

## 2024-02-17 NOTE — Discharge Instructions (Signed)
 It was a pleasure caring for you today. Please follow up with your primary care provider. Seek emergency care if experiencing any new or worsening symptoms.

## 2024-02-17 NOTE — ED Triage Notes (Addendum)
 Pt states at 0200 this am woke and noted L arm and face numb, could not move L arm; pt went back to sleep after 0400; pt went to pcp, sent to ed for further evaluation; LKW 11p last night; drift present in L arm, endorses continued weakness in L arm and and numbness of L face; pt ambulatory with steady gait; endorses HA, took tylenol  pta

## 2024-02-17 NOTE — ED Provider Notes (Signed)
 [place] EMERGENCY DEPARTMENT AT [facility] Provider Note   CSN: [id number] Arrival date & time: 02/17/24  1302     History  No chief complaint on file.   [name] is a 58 y.o. female with PMHx CHF, CAD, fatigue, HTN, HLD, DM who presents to ED concerned for left face and left arm numbness. Also with left arm weakness. LKN around 11PM last night. Patient woke up around 2AM with these symptoms present but then fell back asleep. Patient then woke up at 7AM and felt a little better, but symptoms were still present. Denies any head trauma. States that she has a headache earlier which has since resolved. Denies vision changes. Denies cervical spine pain.  Denies fever, chest pain, dyspnea, nausea, vomiting, diarrhea, dysuria, hematuria, hematochezia.   HPI     Home Medications Prior to Admission medications   Medication Sig Start Date End Date Taking? Authorizing Provider  ACCU-CHEK GUIDE TEST test strip  10/26/23   [provider]  Accu-Chek Softclix Lancets lancets 2 (two) times daily. 09/29/23   [provider]  acetaminophen  (TYLENOL ) 325 MG tablet Take 650 mg by mouth every 6 (six) hours as needed (pain.).    [provider]  amLODipine  (NORVASC ) 10 MG tablet Take 1 tablet (10 mg total) by mouth daily. 12/08/23   Patwardhan, Kaye Parsons, MD  amoxicillin -clavulanate (AUGMENTIN ) 875-125 MG tablet SMARTSIG:1 Tablet(s) By Mouth Every 12 Hours Patient not taking: Reported on 12/08/2023 06/03/23   [provider]  aspirin  81 MG chewable tablet Chew 1 tablet (81 mg total) by mouth in the morning. 12/08/23   Patwardhan, Kaye Parsons, MD  atorvastatin  (LIPITOR ) 40 MG tablet Take 1 tablet (40 mg total) by mouth every evening. 12/08/23   Patwardhan, Kaye Parsons, MD  BINAXNOW COVID-19 AG HOME TEST KIT  06/04/23   [provider]  Blood Glucose Monitoring Suppl (ACCU-CHEK GUIDE ME) w/Device KIT See admin instructions. 10/04/23   [provider]  Blood Pressure Monitoring (BLOOD PRESSURE CUFF) MISC 1 each by Does not apply route daily. 08/05/23   Gerald Kitty., NP  cyclobenzaprine  (FLEXERIL ) 10 MG tablet Take 1 tablet (10 mg total) by mouth 2 (two) times daily as needed for muscle spasms. 10/27/23   Vernestine Gondola, PA-C  Evolocumab  (REPATHA  SURECLICK) 140 MG/ML SOAJ Inject 140 mg into the skin every 14 (fourteen) days. 12/08/23   Patwardhan, Kaye Parsons, MD  Ferrous Sulfate  (IRON ) 325 (65 FE) MG TABS Take 325 mg by mouth. Twice a week    [provider]  furosemide  (LASIX ) 40 MG tablet Take 1 tablet (40 mg total) by mouth as needed. 12/08/23   Patwardhan, Kaye Parsons, MD  isosorbide  mononitrate (IMDUR ) 60 MG 24 hr tablet Take 1 tablet (60 mg total) by mouth daily. 12/08/23   Patwardhan, Kaye Parsons, MD  JARDIANCE  10 MG TABS tablet Take 1 tablet (10 mg total) by mouth daily. 12/08/23   Patwardhan, Kaye Parsons, MD  losartan  (COZAAR ) 25 MG tablet Take 1 tablet (25 mg total) by mouth daily. 12/08/23 06/05/24  Patwardhan, Kaye Parsons, MD  metFORMIN (GLUCOPHAGE) 1000 MG tablet Take 500 mg by mouth 2 (two) times daily with a meal.    [provider]  metFORMIN (GLUCOPHAGE) 500 MG tablet Take 500 mg by mouth 2 (two) times daily. 10/26/23   [provider]  metoprolol  succinate (TOPROL -XL) 100 MG 24 hr tablet Take 1 tablet (100 mg total) by mouth daily. Take with or immediately  following a meal. 12/08/23 06/05/24  Patwardhan, Manish J, MD  nitroGLYCERIN  (NITROSTAT ) 0.4 MG SL tablet PLACE 1 TABLET UNDER TONGUE EVERY 5 MINS AS NEEDED FOR CHEST PAIN 06/23/23   Patwardhan, Kaye Parsons, MD  OZEMPIC, 1 MG/DOSE, 4 MG/3ML SOPN SMARTSIG:1 Milligram(s) Topical Once a Week 09/29/23   [provider]  pantoprazole  (PROTONIX ) 40 MG tablet Take 40 mg by mouth daily. Patient not taking: Reported on 12/08/2023 05/19/23   [provider]  predniSONE (STERAPRED UNI-PAK 21 TAB) 5 MG (21) TBPK tablet Take by mouth as directed. Patient  not taking: Reported on 12/08/2023 05/19/23   [provider]  ranolazine  (RANEXA ) 1000 MG SR tablet Take 1 tablet (1,000 mg total) by mouth 2 (two) times daily. 01/10/24   Patwardhan, Kaye Parsons, MD  Semaglutide (OZEMPIC, 0.25 OR 0.5 MG/DOSE, [place]) Inject 0.5 mg into the skin every Sunday.    [provider]  VENTOLIN  HFA 108 (90 Base) MCG/ACT inhaler SMARTSIG:2 Puff(s) By Mouth Every 4 Hours PRN 05/19/23   [provider]      Allergies    Detrol [tolterodine tartrate]    Review of Systems   Review of Systems  Neurological:  Positive for numbness.    Physical Exam Updated Vital Signs BP 133/78   Pulse 76   Temp 97.9 F (36.6 C) (Oral)   Resp 18   Ht 5\' 2"  (1.575 m)   Wt 98.9 kg   LMP 06/16/2019   SpO2 99%   BMI 39.87 kg/m  Physical Exam Vitals and nursing note reviewed.  Constitutional:      General: She is not in acute distress.    Appearance: She is not ill-appearing or toxic-appearing.  HENT:     Head: Normocephalic and atraumatic.     Mouth/Throat:     Mouth: Mucous membranes are moist.  Eyes:     General: No scleral icterus.       Right eye: No discharge.        Left eye: No discharge.     Conjunctiva/sclera: Conjunctivae normal.  Cardiovascular:     Rate and Rhythm: Normal rate.     Pulses: Normal pulses.     Heart sounds: Normal heart sounds. No murmur heard. Pulmonary:     Effort: Pulmonary effort is normal. No respiratory distress.     Breath sounds: Normal breath sounds. No wheezing, rhonchi or rales.  Abdominal:     Tenderness: There is no abdominal tenderness.  Musculoskeletal:     Right lower leg: No edema.     Left lower leg: No edema.  Skin:    General: Skin is warm and dry.     Findings: No rash.  Neurological:     General: No focal deficit present.     Mental Status: She is alert and oriented to person, place, and time. Mental status is at baseline.     Comments: Patient able to feel pressure in left arm, but not able to  feel sharp sensation at all. Arm non-tense. +2 radial pulse. No discoloration or skin changes. Area of numbness stops just proximal to left shoulder - does not seem to follow a specific dermatome and does not extend to the neck. 4/5 strength in left arm with 5/5 strength in right arm. Sensation to light touch mildly decreased in left side of face. Normal nose to finger.  Psychiatric:        Mood and Affect: Mood normal.        Behavior: Behavior normal.  ED Results / Procedures / Treatments   Labs (all labs ordered are listed, but only abnormal results are displayed) Labs Reviewed  CBC - Abnormal; Notable for the following components:      Result Value   Hemoglobin 10.8 (*)    HCT 33.4 (*)    MCV 77.9 (*)    MCH 25.2 (*)    All other components within normal limits  COMPREHENSIVE METABOLIC PANEL WITH GFR - Abnormal; Notable for the following components:   Glucose, Bld 114 (*)    Creatinine, Ser 1.08 (*)    Calcium  8.7 (*)    Albumin  3.4 (*)    All other components within normal limits  I-STAT CHEM 8, ED - Abnormal; Notable for the following components:   Creatinine, Ser 1.10 (*)    Glucose, Bld 118 (*)    Calcium , Ion 1.11 (*)    Hemoglobin 11.6 (*)    HCT 34.0 (*)    All other components within normal limits  ETHANOL  PROTIME-INR  APTT  DIFFERENTIAL  RAPID URINE DRUG SCREEN, HOSP PERFORMED    EKG EKG Interpretation Date/Time:  Thursday Feb 17 2024 13:15:28 EDT Ventricular Rate:  89 PR Interval:  146 QRS Duration:  86 QT Interval:  342 QTC Calculation: 416 R Axis:   28  Text Interpretation: Normal sinus rhythm Nonspecific T wave abnormality No significant change since last tracing When compared with ECG of 20-Oct-2022 14:36, PREVIOUS ECG IS PRESENT Confirmed by Almond Army ([id number]) on 02/17/2024 3:13:07 PM  Radiology MR BRAIN WO CONTRAST Result Date: 02/17/2024 CLINICAL DATA:  Initial evaluation for acute neuro deficit, stroke suspected. EXAM: MRI HEAD WITHOUT  CONTRAST TECHNIQUE: Multiplanar, multiecho pulse sequences of the brain and surrounding structures were obtained without intravenous contrast. COMPARISON:  CT from earlier the same day. FINDINGS: Brain: Cerebral volume within normal limits. Patchy T2/FLAIR hyperintensity involving the periventricular, deep, and subcortical white matter both cerebral hemispheres as well as the pons, nonspecific, but most likely related chronic microvascular ischemic disease. Overall, changes are moderate in nature. Probable tiny remote left cerebellar infarct (series 16, image 5). No evidence for acute or subacute ischemia. Gray-white matter differentiation maintained. Origin chronic cortical infarction. No acute or chronic intracranial blood products. No mass lesion, midline shift or mass effect. No hydrocephalus or extra-axial fluid collection. Pituitary gland and suprasellar region within normal limits. Vascular: Major intracranial vascular flow voids are maintained. Skull and upper cervical spine: Craniocervical junction with normal limits. Bone marrow signal intensity normal. No scalp soft tissue abnormality. Sinuses/Orbits: Globes orbital soft tissues within normal limits. Trace layering secretions noted within the left sphenoid sinus. Paranasal sinuses are otherwise clear. No mastoid effusion Other: None. IMPRESSION: 1. No acute intracranial abnormality. 2. Moderate cerebral white matter disease, nonspecific, but most likely related chronic microvascular ischemic disease. Probable tiny remote left cerebellar infarct. Electronically Signed   By: Virgia Griffins M.D.   On: 02/17/2024 17:57   CT HEAD WO CONTRAST Result Date: 02/17/2024 CLINICAL DATA:  Neuro deficit, acute, stroke suspected. Left arm weakness. Left face numbness. EXAM: CT HEAD WITHOUT CONTRAST TECHNIQUE: Contiguous axial images were obtained from the base of the skull through the vertex without intravenous contrast. RADIATION DOSE REDUCTION: This exam was  performed according to the departmental dose-optimization program which includes automated exposure control, adjustment of the mA and/or kV according to patient size and/or use of iterative reconstruction technique. COMPARISON:  CT studies 03/02/2021. FINDINGS: Brain: No sign of acute infarction. Chronic small-vessel ischemic changes of the white  matter as seen previously within the cerebral hemispheres. No mass, hemorrhage, hydrocephalus or extra-axial collection. Vascular: There is atherosclerotic calcification of the major vessels at the base of the brain. Skull: Negative Sinuses/Orbits: Clear/normal Other: None IMPRESSION: No acute CT finding. Chronic small-vessel ischemic changes of the white matter. Electronically Signed   By: Bettylou Brunner M.D.   On: 02/17/2024 15:01    Procedures Procedures    Medications Ordered in ED Medications  LORazepam  (ATIVAN ) tablet 0.5 mg (0.5 mg Oral Not Given 02/17/24 1655)    ED Course/ Medical Decision Making/ A&P                                 Medical Decision Making Amount and/or Complexity of Data Reviewed Radiology: ordered.  Risk Prescription drug management.   This patient presents to the ED for concern of numbness, this involves an extensive number of treatment options, and is a complaint that carries with it a high risk of complications and morbidity.  The differential diagnosis includes CVA, ICH, intracranial mass, critical dehydration, heptatic dysfunction, uremia, hypercarbia, intoxication/withdrawal, endocrine abnormality, sepsis/infection.   Co morbidities that complicate the patient evaluation  CHF, CAD, fatigue, HTN, HLD, DM    Additional history obtained:  Dr. Jodee Mulling PCP   Problem List / ED Course / Critical interventions / Medication management  Patient presented for left face/left arm numbness and left arm weakness. Patient initially noticed it when she woke up at 2AM this morning, but then went back to sleep. Patient woke  up again at 7AM and symptoms were a little resolved but still present. LKW 11PM last night. Patient with steady gait.  Patient stating that the face sensation feels a lot better currently in ED. Physical exam with sensation to pressure intact - sensation to sharp is absent. This lack of sensation does not follow 1 specific dermatome and does not extend to the neck - it stops just proximal to the shoulder. There is also slightly diminished strength in left arm. I Ordered, and personally interpreted labs.  EtOH within normal limits.  APTT/PT/INR within normal limits.  CBC without leukocytosis.  There is mild anemia with hemoglobin of 10.8.  UDS negative.  CMP with mild elevation in creatinine at 1.08 which seems to be near patient's baseline.  I personally viewed and interpreted the EKG/cardiac monitored which showed an underlying rhythm of: Sinus rhythm I ordered imaging studies including CT head/MRI brain. I independently visualized and interpreted imaging which showed no acute process. I agree with the radiologist interpretation. I requested consultation with the neurologist on-call Dr. Renaee Caro,  and discussed lab and imaging findings as well as pertinent plan - they were able to review patient's MRI and agree that there is no stroke. They recommend outpatient follow up with neurology. Shared all results with patient.  Answered all questions.  Overall, it appears that patient is having a nerve palsy possibly due to sleeping on her arm incorrectly last night.  I will submit a ambulatory referral to neurology for follow-up.  Patient verbalized understanding of plan. Staffed with Dr. Leida Puna who agrees with plan. I have reviewed the patients home medicines and have made adjustments as needed The patient has been appropriately medically screened and/or stabilized in the ED. I have low suspicion for any other emergent medical condition which would require further screening, evaluation or treatment in the ED or  require inpatient management. At time of discharge the patient is  hemodynamically stable and in no acute distress. I have discussed work-up results and diagnosis with patient and answered all questions. Patient is agreeable with discharge plan. We discussed strict return precautions for returning to the emergency department and they verbalized understanding.     DDx: These were considered less likely due to history of present illness and physical exam findings - CVA/ICH/intracranial mass: CT without concern - critical dehydration/heptatic dysfunction/Uremia: CMP without concern - Hypercarbia/Intoxication/withdrawal: patient history inconsistent with this diagnosis  - Sepsis: patient afebrile with stable vitals   Social Determinants of Health:  none           Final Clinical Impression(s) / ED Diagnoses Final diagnoses:  Arm numbness left    Rx / DC Orders ED Discharge Orders          Ordered    Ambulatory referral to Neurology       Comments: An appointment is requested in approximately: 1 week   02/17/24 2010              Don Fritter 02/17/24 2054    Almond Army, MD 02/18/24 [phone]

## 2024-02-17 NOTE — ED Notes (Signed)
 Awaiting patient from lobby.

## 2024-02-17 NOTE — ED Notes (Addendum)
 Pt reports numbness and different sensation to left arm.  Pt does state left cheek is returning to baseline slowly. No pain reported at this time.

## 2024-02-17 NOTE — ED Notes (Signed)
 Patient to MRI.

## 2024-02-18 ENCOUNTER — Encounter: Payer: Self-pay | Admitting: Neurology

## 2024-02-18 ENCOUNTER — Encounter: Payer: Self-pay | Admitting: Cardiology

## 2024-02-18 ENCOUNTER — Ambulatory Visit: Payer: Medicaid Other | Attending: Cardiology | Admitting: Cardiology

## 2024-02-18 VITALS — BP 110/74 | HR 97 | Ht 62.0 in | Wt 219.8 lb

## 2024-02-18 DIAGNOSIS — E782 Mixed hyperlipidemia: Secondary | ICD-10-CM | POA: Diagnosis present

## 2024-02-18 DIAGNOSIS — I10 Essential (primary) hypertension: Secondary | ICD-10-CM

## 2024-02-18 DIAGNOSIS — I2510 Atherosclerotic heart disease of native coronary artery without angina pectoris: Secondary | ICD-10-CM | POA: Diagnosis present

## 2024-02-18 NOTE — Patient Instructions (Signed)
 Your next appointment:   1 year(s)  Provider:   Cody Das, MD    We recommend signing up for the patient portal called "MyChart".  Sign up information is provided on this After Visit Summary.  MyChart is used to connect with patients for Virtual Visits (Telemedicine).  Patients are able to view lab/test results, encounter notes, upcoming appointments, etc.  Non-urgent messages can be sent to your provider as well.   To learn more about what you can do with MyChart, go to [email or web address].

## 2024-02-18 NOTE — Progress Notes (Unsigned)
  Cardiology Office Note:  .   Date:  02/18/2024  ID:  [name], DOB September 09, 1966, MRN [id number] PCP: Courtney Maple, PA-C  [facility] Providers Cardiologist:  Courtney Ivy, MD PCP: Courtney Maple, PA-C  Chief Complaint  Patient presents with   Follow-up      History of Present Illness: .    [name] is a 58 y.o. female with hypertension, type 2 diabetes mellitus, CAD, obesity  Patient is doing well from cardiac standpoint, denies any chest pain. Breathing has improved. Reviewed recent labs with the patient, details below.  Vitals:   02/18/24 1010  BP: 110/74  Pulse: 97  SpO2: 98%      ROS:  Review of Systems  Cardiovascular:  Positive for chest pain and dyspnea on exertion. Negative for leg swelling, palpitations and syncope.     Studies Reviewed: Aaron Aas        Independently interpreted 02/2024: Chol 98, TG 35, HDL 59, LDL 29 HbA1C 6.4% Hb 11.6 Cr 1.1 BNP<36  Coronary intervention 10/20/2022: LM: Normal LAD: Mild mid LAD, diag 2 disease LCx: Ostial 40%, mid 70%, small OM 60% stenoses RCA: Prox 80% stenoses (serial progression). Mid overlapping stents with mid 50% ISR, RPL focal 70% stenosis   Normal LVEDP      Successful percutaneous coronary intervention ostial to mid RCA        PTCA and stent placement 3.0 X 28 mm Synergy drug-eluting stent        Postdilatation using 3.5 guide and 4.0 mm [place] balloons in serial fashion from mid to ostial RCA, up to 18 atm IVUS MSA 5 mm2, MLA 10 mm2   Physical Exam:   Physical Exam Vitals and nursing note reviewed.  Constitutional:      General: She is not in acute distress. Neck:     Vascular: No JVD.  Cardiovascular:     Rate and Rhythm: Normal rate and regular rhythm.     Heart sounds: Normal heart sounds. No murmur heard. Pulmonary:     Effort: Pulmonary effort is normal.     Breath sounds: Normal breath sounds. No wheezing or rales.  Musculoskeletal:     Right lower leg: No edema.      Left lower leg: No edema.      VISIT DIAGNOSES:   ICD-10-CM   1. Coronary artery disease involving native coronary artery of native heart without angina pectoris  I25.10     2. Primary hypertension  I10     3. Mixed hyperlipidemia  E78.2         ASSESSMENT AND PLAN: .    [name] is a 58 y.o. female with hypertension, hyperlipidemia, type 2 diabetes mellitus, CAD  CAD: Multiple PCI's to RCA. Most recently s/p ostial-prox PCI to RCA (10/2022). No significant angina or dyspnea at this time. Labs form 02/2024 show normal BNP, controlled lipids and A1C. Hb is stable. Continue Aspirin , Crestor  and Repatha , metoprolol , Imdur .   Mixed hyperlipidemia: As above   Hypertension: Well-controlled.     F/u in 1 year  Signed, Cody Das, MD

## 2024-02-19 ENCOUNTER — Encounter: Payer: Self-pay | Admitting: Cardiology

## 2024-03-27 ENCOUNTER — Ambulatory Visit: Admitting: Student in an Organized Health Care Education/Training Program

## 2024-03-27 ENCOUNTER — Encounter: Payer: Self-pay | Admitting: Student in an Organized Health Care Education/Training Program

## 2024-03-27 VITALS — BP 122/88 | HR 78 | Temp 97.8°F | Ht 62.0 in | Wt 222.0 lb

## 2024-03-27 DIAGNOSIS — R053 Chronic cough: Secondary | ICD-10-CM

## 2024-03-27 DIAGNOSIS — I2510 Atherosclerotic heart disease of native coronary artery without angina pectoris: Secondary | ICD-10-CM | POA: Diagnosis not present

## 2024-03-27 DIAGNOSIS — I5032 Chronic diastolic (congestive) heart failure: Secondary | ICD-10-CM | POA: Diagnosis not present

## 2024-03-27 MED ORDER — FLUTICASONE-SALMETEROL 100-50 MCG/ACT IN AEPB
1.0000 | INHALATION_SPRAY | Freq: Two times a day (BID) | RESPIRATORY_TRACT | 12 refills | Status: AC
Start: 2024-03-27 — End: ?

## 2024-03-27 NOTE — Progress Notes (Unsigned)
 Synopsis: Referred in *** by Isabelle Maple, PA-C  Assessment & Plan:   1. Chronic coughing (Primary) *** - Nitric oxide - Pulmonary Function Test; Future - fluticasone-salmeterol (ADVAIR DISKUS) 100-50 MCG/ACT AEPB; Inhale 1 puff into the lungs 2 (two) times daily.  Dispense: 60 each; Refill: 12 - Allergen Panel (27) + IGE; Future - CBC with Differential/Platelet; Future   Return in about 4 weeks (around 04/24/2024).  I spent *** minutes caring for this patient today, including {EM billing:28027}  Vergia Glasgow, MD [facility] 03/27/2024 2:01 PM    End of visit medications:  Meds ordered this encounter  Medications   fluticasone-salmeterol (ADVAIR DISKUS) 100-50 MCG/ACT AEPB    Sig: Inhale 1 puff into the lungs 2 (two) times daily.    Dispense:  60 each    Refill:  12     Current Outpatient Medications:    ACCU-CHEK GUIDE TEST test strip, , Disp: , Rfl:    Accu-Chek Softclix Lancets lancets, 2 (two) times daily., Disp: , Rfl:    Acetaminophen  500 MG capsule, , Disp: , Rfl:    amLODipine  (NORVASC ) 10 MG tablet, Take 1 tablet (10 mg total) by mouth daily., Disp: 90 tablet, Rfl: 3   aspirin  81 MG chewable tablet, Chew 1 tablet (81 mg total) by mouth in the morning., Disp: 90 tablet, Rfl: 3   atorvastatin  (LIPITOR ) 40 MG tablet, Take 1 tablet (40 mg total) by mouth every evening., Disp: 90 tablet, Rfl: 3   BINAXNOW COVID-19 AG HOME TEST KIT, , Disp: , Rfl:    Blood Glucose Monitoring Suppl (ACCU-CHEK GUIDE ME) w/Device KIT, See admin instructions., Disp: , Rfl:    Blood Pressure Monitoring (BLOOD PRESSURE CUFF) MISC, 1 each by Does not apply route daily., Disp: 1 each, Rfl: 0   Evolocumab  (REPATHA  SURECLICK) 140 MG/ML SOAJ, Inject 140 mg into the skin every 14 (fourteen) days., Disp: 2 mL, Rfl: 11   Ferrous Sulfate  (IRON ) 325 (65 FE) MG TABS, Take 325 mg by mouth. Twice a week, Disp: , Rfl:    fluticasone-salmeterol (ADVAIR DISKUS) 100-50 MCG/ACT AEPB,  Inhale 1 puff into the lungs 2 (two) times daily., Disp: 60 each, Rfl: 12   furosemide  (LASIX ) 40 MG tablet, Take 1 tablet (40 mg total) by mouth as needed., Disp: 30 tablet, Rfl: 6   HYDROCHLOROTHIAZIDE  PO, hydroCHLOROthiazide , Disp: , Rfl:    isosorbide  mononitrate (IMDUR ) 60 MG 24 hr tablet, Take 1 tablet (60 mg total) by mouth daily., Disp: 90 tablet, Rfl: 2   JARDIANCE  10 MG TABS tablet, Take 1 tablet (10 mg total) by mouth daily., Disp: 30 tablet, Rfl: 6   losartan  (COZAAR ) 25 MG tablet, Take 1 tablet (25 mg total) by mouth daily., Disp: 90 tablet, Rfl: 1   metFORMIN (GLUCOPHAGE) 500 MG tablet, Take 500 mg by mouth 2 (two) times daily., Disp: , Rfl:    metoprolol  succinate (TOPROL -XL) 100 MG 24 hr tablet, Take 1 tablet (100 mg total) by mouth daily. Take with or immediately following a meal., Disp: 90 tablet, Rfl: 1   nitroGLYCERIN  (NITROSTAT ) 0.4 MG SL tablet, PLACE 1 TABLET UNDER TONGUE EVERY 5 MINS AS NEEDED FOR CHEST PAIN, Disp: 25 tablet, Rfl: 3   OZEMPIC, 1 MG/DOSE, 4 MG/3ML SOPN, SMARTSIG:1 Milligram(s) Topical Once a Week, Disp: , Rfl:    pantoprazole  (PROTONIX ) 40 MG tablet, Take 1 tablet by mouth daily., Disp: , Rfl:    ranolazine  (RANEXA ) 1000 MG SR tablet, Take 1 tablet (1,000 mg  total) by mouth 2 (two) times daily., Disp: 90 tablet, Rfl: 3   Semaglutide (OZEMPIC, 0.25 OR 0.5 MG/DOSE, Anita), Inject 0.5 mg into the skin every Sunday., Disp: , Rfl:    VITAMIN D, ERGOCALCIFEROL, PO, Vitamin D (Ergocalciferol), Disp: , Rfl:    Subjective:   PATIENT ID: [name]: female DOB: Apr 18, 1966, MRN: [id number]  Chief Complaint  Patient presents with   New Patient (Initial Visit)    Chronic cough, CT: 5/1  Was referred for low-pitched rhonchi, has been having recurrent chest congestion and cough that flares up, has had chest pain and was seen by cardiology and was cleared by them and urged to be seen here, SOB/DOE, has had some mild wheezing and rattling breath sounds while  talking. Not currently on any inhalers.    HPI  Patient is a pleasant 58 year old female presenting to clinic for the evaluation of cough and shortness of breath.  Patient reports symptom since around 2 to 3 months ago (March/April 2025).  She developed a cough that was mostly dry and happens throughout the day.  She was seen by her outpatient providers and was told she had a rattling sound in her chest.  The cough is nonproductive and she denies any hemoptysis.  She is rarely able to bring up phlegm with this cough.  She has not had any fevers, chills, night sweats, chest pain, or chest tightness.  She does report an associated dyspnea that is mostly exertional.  She denies any pleurisy.  She feels that the cough is somewhat worse at night.  She also has bouts of cough in the morning after waking up.  Patient does not endorse any history of asthma nor any previous symptoms of this sort.  She does report feeling irritated by smoke, scents, and perfumes.  She feels that she has to rotate through some of her perfumes because they would cause her irritation.  She has not had any personal history of asthma in the past nor has she had any episodes of recurrent bronchitis.  She has not used any inhalers previously.  Patient reports a distant history of smoking, having quit in 1995.  She smoked for 10 years with around 5 pack years of smoking history.  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  She denies any other occupational exposures.  Ancillary information including prior medications, full medical/surgical/family/social histories, and PFTs (when available) are listed below and have been reviewed.   ROS   Objective:   Vitals:   03/27/24 1317  BP: 122/88  Pulse: 78  Temp: 97.8 F (36.6 C)  TempSrc: Oral  SpO2: 97%  Weight: 222 lb (100.7 kg)  Height: 5\' 2"  (1.575 m)   97% on *** LPM *** RA BMI Readings from Last 3 Encounters:  03/27/24 40.60 kg/m  02/18/24 40.20 kg/m   02/17/24 39.87 kg/m   Wt Readings from Last 3 Encounters:  03/27/24 222 lb (100.7 kg)  02/18/24 219 lb 12.8 oz (99.7 kg)  02/17/24 218 lb (98.9 kg)    Physical Exam    Ancillary Information    Past Medical History:  Diagnosis Date   Anemia    CAD (coronary artery disease)    PCI with stent to RCA in 2019   CHF (congestive heart failure) (HCC) 04/2011   EF 15-20% from echo 05/19/11   DOE (dyspnea on exertion)    DVT (deep venous thrombosis) (HCC) 06/2011   LLE   Fatigue    HTN (  hypertension)    Hyperlipidemia    Menorrhagia    with iron  deficient anemia   NSTEMI (non-ST elevated myocardial infarction) (HCC) 10/26/2017   NSTEMI (non-ST elevated myocardial infarction) (HCC) 10/27/2017   Obesity    Orthopnea    Type II diabetes mellitus (HCC)      Family History  Problem Relation Age of Onset   Hypertension Mother    Stroke Father    Heart attack Sister    Stroke Sister      Past Surgical History:  Procedure Laterality Date   CORONARY ANGIOPLASTY WITH STENT PLACEMENT  2013;  10/27/2017   CORONARY BALLOON ANGIOPLASTY N/A 05/26/2022   Procedure: CORONARY BALLOON ANGIOPLASTY;  Surgeon: Cody Das, MD;  Location: [facility];  Service: Cardiovascular;  Laterality: N/A;   CORONARY STENT INTERVENTION N/A 10/27/2017   Procedure: CORONARY STENT INTERVENTION;  Surgeon: Cody Das, MD;  Location: [facility];  Service: Cardiovascular;  Laterality: N/A;   CORONARY STENT INTERVENTION N/A 03/10/2022   Procedure: CORONARY STENT INTERVENTION;  Surgeon: Knox Perl, MD;  Location: [facility];  Service: Cardiovascular;  Laterality: N/A;   CORONARY STENT INTERVENTION N/A 03/11/2022   Procedure: CORONARY STENT INTERVENTION;  Surgeon: Cody Das, MD;  Location: [facility];  Service: Cardiovascular;  Laterality: N/A;  aborted   CORONARY STENT INTERVENTION N/A 10/20/2022   Procedure: CORONARY STENT INTERVENTION;  Surgeon: Cody Das, MD;  Location: [facility];  Service: Cardiovascular;  Laterality: N/A;   CORONARY ULTRASOUND/IVUS N/A 05/26/2022   Procedure: Intravascular Ultrasound/IVUS;  Surgeon: Cody Das, MD;  Location: [facility];  Service: Cardiovascular;  Laterality: N/A;   CORONARY ULTRASOUND/IVUS N/A 10/20/2022   Procedure: Intravascular Ultrasound/IVUS;  Surgeon: Cody Das, MD;  Location: [facility];  Service: Cardiovascular;  Laterality: N/A;   LEFT HEART CATH AND CORONARY ANGIOGRAPHY N/A 10/27/2017   Procedure: LEFT HEART CATH AND CORONARY ANGIOGRAPHY;  Surgeon: Cody Das, MD;  Location: [facility];  Service: Cardiovascular;  Laterality: N/A;   LEFT HEART CATH AND CORONARY ANGIOGRAPHY N/A 11/17/2019   Procedure: LEFT HEART CATH AND CORONARY ANGIOGRAPHY;  Surgeon: Cody Das, MD;  [facility]: [facility];  Service: Cardiovascular;  Laterality: N/A;   LEFT HEART CATH AND CORONARY ANGIOGRAPHY N/A 03/10/2022   Procedure: LEFT HEART CATH AND CORONARY ANGIOGRAPHY;  Surgeon: Knox Perl, MD;  [facility]: [facility];  Service: Cardiovascular;  Laterality: N/A;   LEFT HEART CATH AND CORONARY ANGIOGRAPHY N/A 07/27/2022   Procedure: LEFT HEART CATH AND CORONARY ANGIOGRAPHY;  Surgeon: Knox Perl, MD;  [facility]: [facility];  Service: Cardiovascular;  Laterality: N/A;   LEFT HEART CATH AND CORONARY ANGIOGRAPHY N/A 10/20/2022   Procedure: LEFT HEART CATH AND CORONARY ANGIOGRAPHY;  Surgeon: Cody Das, MD;  [facility]: [facility];  Service: Cardiovascular;  Laterality: N/A;   LEFT HEART CATHETERIZATION WITH CORONARY ANGIOGRAM N/A 11/17/2011   Procedure: LEFT HEART CATHETERIZATION WITH CORONARY ANGIOGRAM;  Surgeon: Jessica Morn, MD;  Location: [facility];  Service: Cardiovascular;  Laterality: N/A;   OVARIAN CYST REMOVAL     TUBAL LIGATION  2006   ULTRASOUND GUIDANCE FOR VASCULAR ACCESS  10/27/2017   Procedure: Ultrasound  Guidance For Vascular Access;  Surgeon: Cody Das, MD;  Location: [facility];  Service: Cardiovascular;;    Social History   Socioeconomic History   Marital status: Widowed  Spouse name: Not on file   Number of children: 3   Years of education: 56   Highest education level: 11th grade  Occupational History   Occupation: unemployed  Tobacco Use   Smoking status: Former    Current packs/day: 0.00    Average packs/day: 0.5 packs/day for 10.0 years (5.0 ttl pk-yrs)    Types: Cigarettes    Start date: 10/20/1983    Quit date: 10/19/1993    Years since quitting: 30.4   Smokeless tobacco: Never  Vaping Use   Vaping status: Never Used  Substance and Sexual Activity   Alcohol use: No   Drug use: No   Sexual activity: Not Currently  Other Topics Concern   Not on file  Social History Narrative   Not on file   Social Drivers of Health   Financial Resource Strain: Not on file  Food Insecurity: No Food Insecurity (07/25/2022)   Hunger Vital Sign    Worried About Running Out of Food in the Last Year: Never true    Ran Out of Food in the Last Year: Never true  Transportation Needs: No Transportation Needs (07/25/2022)   PRAPARE - Administrator, Civil Service (Medical): No    Lack of Transportation (Non-Medical): No  Physical Activity: Not on file  Stress: Not on file  Social Connections: Not on file  Intimate Partner Violence: Not At Risk (07/25/2022)   Humiliation, Afraid, Rape, and Kick questionnaire    Fear of Current or Ex-Partner: No    Emotionally Abused: No    Physically Abused: No    Sexually Abused: No     Allergies  Allergen Reactions   Tolterodine Tartrate Dermatitis     CBC    Component Value Date/Time   WBC 4.1 02/17/2024 1310   RBC 4.29 02/17/2024 1310   HGB 11.6 (L) 02/17/2024 1338   HGB 11.8 12/08/2023 0957   HCT 34.0 (L) 02/17/2024 1338   HCT 37.5 12/08/2023 0957   PLT 285 02/17/2024 1310   PLT 336 12/08/2023 0957   MCV  77.9 (L) 02/17/2024 1310   MCV 78 (L) 12/08/2023 0957   MCH 25.2 (L) 02/17/2024 1310   MCHC 32.3 02/17/2024 1310   RDW 14.4 02/17/2024 1310   RDW 13.1 12/08/2023 0957   LYMPHSABS 1.0 02/17/2024 1310   MONOABS 0.4 02/17/2024 1310   EOSABS 0.1 02/17/2024 1310   BASOSABS 0.0 02/17/2024 1310    Pulmonary Functions Testing Results:     No data to display          Outpatient Medications Prior to Visit  Medication Sig Dispense Refill   ACCU-CHEK GUIDE TEST test strip      Accu-Chek Softclix Lancets lancets 2 (two) times daily.     Acetaminophen  500 MG capsule      amLODipine  (NORVASC ) 10 MG tablet Take 1 tablet (10 mg total) by mouth daily. 90 tablet 3   aspirin  81 MG chewable tablet Chew 1 tablet (81 mg total) by mouth in the morning. 90 tablet 3   atorvastatin  (LIPITOR ) 40 MG tablet Take 1 tablet (40 mg total) by mouth every evening. 90 tablet 3   BINAXNOW COVID-19 AG HOME TEST KIT      Blood Glucose Monitoring Suppl (ACCU-CHEK GUIDE ME) w/Device KIT See admin instructions.     Blood Pressure Monitoring (BLOOD PRESSURE CUFF) MISC 1 each by Does not apply route daily. 1 each 0   Evolocumab  (REPATHA  SURECLICK) 140 MG/ML SOAJ Inject 140 mg into the  skin every 14 (fourteen) days. 2 mL 11   Ferrous Sulfate  (IRON ) 325 (65 FE) MG TABS Take 325 mg by mouth. Twice a week     furosemide  (LASIX ) 40 MG tablet Take 1 tablet (40 mg total) by mouth as needed. 30 tablet 6   HYDROCHLOROTHIAZIDE  PO hydroCHLOROthiazide      isosorbide  mononitrate (IMDUR ) 60 MG 24 hr tablet Take 1 tablet (60 mg total) by mouth daily. 90 tablet 2   JARDIANCE  10 MG TABS tablet Take 1 tablet (10 mg total) by mouth daily. 30 tablet 6   losartan  (COZAAR ) 25 MG tablet Take 1 tablet (25 mg total) by mouth daily. 90 tablet 1   metFORMIN (GLUCOPHAGE) 500 MG tablet Take 500 mg by mouth 2 (two) times daily.     metoprolol  succinate (TOPROL -XL) 100 MG 24 hr tablet Take 1 tablet (100 mg total) by mouth daily. Take with or  immediately following a meal. 90 tablet 1   nitroGLYCERIN  (NITROSTAT ) 0.4 MG SL tablet PLACE 1 TABLET UNDER TONGUE EVERY 5 MINS AS NEEDED FOR CHEST PAIN 25 tablet 3   OZEMPIC, 1 MG/DOSE, 4 MG/3ML SOPN SMARTSIG:1 Milligram(s) Topical Once a Week     pantoprazole  (PROTONIX ) 40 MG tablet Take 1 tablet by mouth daily.     ranolazine  (RANEXA ) 1000 MG SR tablet Take 1 tablet (1,000 mg total) by mouth 2 (two) times daily. 90 tablet 3   Semaglutide (OZEMPIC, 0.25 OR 0.5 MG/DOSE, ) Inject 0.5 mg into the skin every Sunday.     VITAMIN D, ERGOCALCIFEROL, PO Vitamin D (Ergocalciferol)     No facility-administered medications prior to visit.

## 2024-04-10 ENCOUNTER — Encounter: Payer: Self-pay | Admitting: Neurology

## 2024-04-10 ENCOUNTER — Ambulatory Visit: Admitting: Neurology

## 2024-04-10 VITALS — BP 133/85 | HR 85 | Ht 62.0 in | Wt 224.0 lb

## 2024-04-10 DIAGNOSIS — R200 Anesthesia of skin: Secondary | ICD-10-CM | POA: Diagnosis not present

## 2024-04-10 NOTE — Progress Notes (Signed)
 [facility] Neurology [facility] Clinic Note - Initial Visit   Date: 04/10/2024   [name] MRN: [id number] DOB: 1966-01-05   Dear Nidia Mays, PA-C:  Thank you for your kind referral of [name] for consultation of left arm numbness. Although her history is well known to you, please allow us  to reiterate it for the purpose of our medical record. The patient was accompanied to the clinic by self.    [name] is a 58 y.o. right-handed female with hypertension, diabetes mellitus, CAD, and hyperlipidemia presenting for evaluation of left arm numbness.   IMPRESSION/PLAN: Left arm numbness and weakness.  Diffuse symptoms suggests possible cervical pathology, however, her reflexes are normal.   To further evaluate, she will return for NCS/EMG of the left arm.  I also recommend starting neck PT.  If no improvement with PT, MRI cervical spine can be ordered.   Further recommendations pending results.   ------------------------------------------------------------- History of present illness: On 4/30, she woke up at 2am to use the bathroom and noticed severe numbness and weakness of the left arm.   She also had weakness of the arm and numbness involving the left side of the face.  It lasted several hours before improving.  She went to the ER where MRI brain did not show any acute abnormalities.  She continues to have numbness in the left arm, along with pain in the left shoulder. She also feels that her arm is weaker.   Out-side paper records, electronic medical record, and images have been reviewed where available and summarized as:  MRI brain wo contrast 02/17/2024: 1. No acute intracranial abnormality. 2. Moderate cerebral white matter disease, nonspecific, but most likely related chronic microvascular ischemic disease. Probable tiny remote left cerebellar infarct.  Lab Results  Component Value Date   HGBA1C 6.4 (H) 12/08/2023   Lab Results  Component  Value Date   VITAMINB12 454 11/02/2011   Lab Results  Component Value Date   TSH 0.646 11/02/2011   No results found for: ELIGIO SANDHOFF  Past Medical History:  Diagnosis Date   Anemia    CAD (coronary artery disease)    PCI with stent to RCA in 2019   CHF (congestive heart failure) (HCC) 04/2011   EF 15-20% from echo 05/19/11   DOE (dyspnea on exertion)    DVT (deep venous thrombosis) (HCC) 06/2011   LLE   Fatigue    HTN (hypertension)    Hyperlipidemia    Menorrhagia    with iron  deficient anemia   NSTEMI (non-ST elevated myocardial infarction) (HCC) 10/26/2017   NSTEMI (non-ST elevated myocardial infarction) (HCC) 10/27/2017   Obesity    Orthopnea    Type II diabetes mellitus (HCC)     Past Surgical History:  Procedure Laterality Date   CORONARY ANGIOPLASTY WITH STENT PLACEMENT  2013;  10/27/2017   CORONARY BALLOON ANGIOPLASTY N/A 05/26/2022   Procedure: CORONARY BALLOON ANGIOPLASTY;  Surgeon: Elmira Newman PARAS, MD;  Location: [facility];  Service: Cardiovascular;  Laterality: N/A;   CORONARY STENT INTERVENTION N/A 10/27/2017   Procedure: CORONARY STENT INTERVENTION;  Surgeon: Elmira Newman PARAS, MD;  Location: [facility];  Service: Cardiovascular;  Laterality: N/A;   CORONARY STENT INTERVENTION N/A 03/10/2022   Procedure: CORONARY STENT INTERVENTION;  Surgeon: Ladona Heinz, MD;  Location: [facility];  Service: Cardiovascular;  Laterality: N/A;   CORONARY STENT INTERVENTION N/A 03/11/2022   Procedure: CORONARY STENT INTERVENTION;  Surgeon: Elmira Newman PARAS, MD;  Location: [facility];  Service: Cardiovascular;  Laterality: N/A;  aborted   CORONARY STENT INTERVENTION N/A 10/20/2022   Procedure: CORONARY STENT INTERVENTION;  Surgeon: Elmira Newman PARAS, MD;  Location: [facility];  Service: Cardiovascular;  Laterality: N/A;   CORONARY ULTRASOUND/IVUS N/A 05/26/2022   Procedure: Intravascular Ultrasound/IVUS;  Surgeon:  Elmira Newman PARAS, MD;  Location: [facility];  Service: Cardiovascular;  Laterality: N/A;   CORONARY ULTRASOUND/IVUS N/A 10/20/2022   Procedure: Intravascular Ultrasound/IVUS;  Surgeon: Elmira Newman PARAS, MD;  Location: [facility];  Service: Cardiovascular;  Laterality: N/A;   LEFT HEART CATH AND CORONARY ANGIOGRAPHY N/A 10/27/2017   Procedure: LEFT HEART CATH AND CORONARY ANGIOGRAPHY;  Surgeon: Elmira Newman PARAS, MD;  Location: [facility];  Service: Cardiovascular;  Laterality: N/A;   LEFT HEART CATH AND CORONARY ANGIOGRAPHY N/A 11/17/2019   Procedure: LEFT HEART CATH AND CORONARY ANGIOGRAPHY;  Surgeon: Elmira Newman PARAS, MD;  Location: [facility];  Service: Cardiovascular;  Laterality: N/A;   LEFT HEART CATH AND CORONARY ANGIOGRAPHY N/A 03/10/2022   Procedure: LEFT HEART CATH AND CORONARY ANGIOGRAPHY;  Surgeon: Ladona Heinz, MD;  Location: [facility];  Service: Cardiovascular;  Laterality: N/A;   LEFT HEART CATH AND CORONARY ANGIOGRAPHY N/A 07/27/2022   Procedure: LEFT HEART CATH AND CORONARY ANGIOGRAPHY;  Surgeon: Ladona Heinz, MD;  Location: [facility];  Service: Cardiovascular;  Laterality: N/A;   LEFT HEART CATH AND CORONARY ANGIOGRAPHY N/A 10/20/2022   Procedure: LEFT HEART CATH AND CORONARY ANGIOGRAPHY;  Surgeon: Elmira Newman PARAS, MD;  Location: [facility];  Service: Cardiovascular;  Laterality: N/A;   LEFT HEART CATHETERIZATION WITH CORONARY ANGIOGRAM N/A 11/17/2011   Procedure: LEFT HEART CATHETERIZATION WITH CORONARY ANGIOGRAM;  Surgeon: Erick JONELLE Ladona, MD;  Location: [facility] CATH [facility];  Service: Cardiovascular;  Laterality: N/A;   OVARIAN CYST REMOVAL     TUBAL LIGATION  2006   ULTRASOUND GUIDANCE FOR VASCULAR ACCESS  10/27/2017   Procedure: Ultrasound Guidance For Vascular Access;  Surgeon: Elmira Newman PARAS, MD;  Location: [facility];  Service: Cardiovascular;;     Medications:  Outpatient Encounter Medications as of  04/10/2024  Medication Sig Note   ACCU-CHEK GUIDE TEST test strip     Accu-Chek Softclix Lancets lancets 2 (two) times daily.    Acetaminophen  500 MG capsule     amLODipine  (NORVASC ) 10 MG tablet Take 1 tablet (10 mg total) by mouth daily.    aspirin  81 MG chewable tablet Chew 1 tablet (81 mg total) by mouth in the morning.    atorvastatin  (LIPITOR ) 40 MG tablet Take 1 tablet (40 mg total) by mouth every evening.    Blood Glucose Monitoring Suppl (ACCU-CHEK GUIDE ME) w/Device KIT See admin instructions.    Blood Pressure Monitoring (BLOOD PRESSURE CUFF) MISC 1 each by Does not apply route daily.    Evolocumab  (REPATHA  SURECLICK) 140 MG/ML SOAJ Inject 140 mg into the skin every 14 (fourteen) days.    Ferrous Sulfate  (IRON ) 325 (65 FE) MG TABS Take 325 mg by mouth. Twice a week    fluticasone -salmeterol (ADVAIR DISKUS) 100-50 MCG/ACT AEPB Inhale 1 puff into the lungs 2 (two) times daily.    furosemide  (LASIX ) 40 MG tablet Take 1 tablet (40 mg total) by mouth as needed.    HYDROCHLOROTHIAZIDE  PO hydroCHLOROthiazide     isosorbide  mononitrate (IMDUR ) 60 MG 24 hr tablet Take 1 tablet (60 mg total) by mouth daily.  JARDIANCE  10 MG TABS tablet Take 1 tablet (10 mg total) by mouth daily.    losartan  (COZAAR ) 25 MG tablet Take 1 tablet (25 mg total) by mouth daily.    metFORMIN (GLUCOPHAGE) 500 MG tablet Take 500 mg by mouth 2 (two) times daily.    metoprolol  succinate (TOPROL -XL) 100 MG 24 hr tablet Take 1 tablet (100 mg total) by mouth daily. Take with or immediately following a meal.    nitroGLYCERIN  (NITROSTAT ) 0.4 MG SL tablet PLACE 1 TABLET UNDER TONGUE EVERY 5 MINS AS NEEDED FOR CHEST PAIN    OZEMPIC, 1 MG/DOSE, 4 MG/3ML SOPN SMARTSIG:1 Milligram(s) Topical Once a Week    ranolazine  (RANEXA ) 1000 MG SR tablet Take 1 tablet (1,000 mg total) by mouth 2 (two) times daily.    Semaglutide (OZEMPIC, 0.25 OR 0.5 MG/DOSE, [place]) Inject 0.5 mg into the skin every Sunday. 01/14/2023: Pt reports taking 1mg   once a week    VITAMIN D, ERGOCALCIFEROL, PO Vitamin D (Ergocalciferol)    BINAXNOW COVID-19 AG HOME TEST KIT  (Patient not taking: Reported on 04/10/2024)    pantoprazole  (PROTONIX ) 40 MG tablet Take 1 tablet by mouth daily. (Patient not taking: Reported on 04/10/2024)    No facility-administered encounter medications on file as of 04/10/2024.    Allergies:  Allergies  Allergen Reactions   Tolterodine Tartrate Dermatitis    Family History: Family History  Problem Relation Age of Onset   Hypertension Mother    Stroke Father    Heart attack Sister    Stroke Sister     Social History: Social History   Tobacco Use   Smoking status: Former    Current packs/day: 0.00    Average packs/day: 0.5 packs/day for 10.0 years (5.0 ttl pk-yrs)    Types: Cigarettes    Start date: 10/20/1983    Quit date: 10/19/1993    Years since quitting: 30.4   Smokeless tobacco: Never  Vaping Use   Vaping status: Never Used  Substance Use Topics   Alcohol use: No   Drug use: No   Social History   Social History Narrative   Are you right handed or left handed? Right Handed   Are you currently employed ? No    What is your current occupation?   Do you live at home alone? No    Who lives with you? Children    What type of home do you live in: 1 story or 2 story? Two story home        Vital Signs:  BP 133/85   Pulse 85   Ht 5' 2 (1.575 m)   Wt 224 lb (101.6 kg)   LMP 06/16/2019   SpO2 99%   BMI 40.97 kg/m    Neurological Exam: MENTAL STATUS including orientation to time, place, person, recent and remote memory, attention span and concentration, language, and fund of knowledge is normal.  Speech is not dysarthric.  CRANIAL NERVES: II:  No visual field defects.     III-IV-VI: Pupils equal round and reactive to light.  Normal conjugate, extra-ocular eye movements in all directions of gaze.  No nystagmus.  No ptosis.   V:  Normal facial sensation.    VII:  Normal facial symmetry and  movements.   VIII:  Normal hearing and vestibular function.   IX-X:  Normal palatal movement.   XI:  Normal shoulder shrug and head rotation.   XII:  Normal tongue strength and range of motion, no deviation or fasciculation.  MOTOR:  No atrophy, fasciculations or abnormal movements.  No pronator drift.   Upper Extremity:  Right  Left  Deltoid  5/5   4/5   Biceps  5/5   4/5   Triceps  5/5   4/5   Wrist extensors  5/5   4/5   Wrist flexors  5/5   4/5   Finger extensors  5/5   4/5   Finger flexors  5/5   4/5   Dorsal interossei  5/5   4/5   Abductor pollicis  5/5   4/5   Tone (Ashworth scale)  0  0   Lower Extremity:  Right  Left  Hip flexors  5/5   5/5   Knee flexors  5/5   5/5   Knee extensors  5/5   5/5   Dorsiflexors  5/5   5/5   Plantarflexors  5/5   5/5   Toe extensors  5/5   5/5   Toe flexors  5/5   5/5   Tone (Ashworth scale)  0  0   MSRs:                                           Right        Left brachioradialis 2+  2+  biceps 2+  2+  triceps 2+  2+  patellar 2+  2+  ankle jerk 2+  2+  Hoffman no  no  plantar response down  down   SENSORY:  Pin prick and temperature is reduced over the left arm and forearm.  Sensation is intact in the right upper extremity and bilateral legs.  COORDINATION/GAIT: Normal finger-to- nose-finger.  Intact rapid alternating movements bilaterally.  Gait slow, antalgic, stable and unassisted.   Thank you for allowing me to participate in patient's care.  If I can answer any additional questions, I would be pleased to do so.    Sincerely,    Amiayah Giebel K. Tobie, DO

## 2024-04-10 NOTE — Patient Instructions (Addendum)
 Referral to physical therapy  Nerve testing of the left arm  ELECTROMYOGRAM AND NERVE CONDUCTION STUDIES (EMG/NCS) INSTRUCTIONS  How to Prepare The neurologist conducting the EMG will need to know if you have certain medical conditions. Tell the neurologist and other EMG lab personnel if you: Have a pacemaker or any other electrical medical device Take blood-thinning medications Have hemophilia, a blood-clotting disorder that causes prolonged bleeding Bathing Take a shower or bath shortly before your exam in order to remove oils from your skin. Don't apply lotions or creams before the exam.  What to Expect You'll likely be asked to change into a hospital gown for the procedure and lie down on an examination table. The following explanations can help you understand what will happen during the exam.  Electrodes. The neurologist or a technician places surface electrodes at various locations on your skin depending on where you're experiencing symptoms. Or the neurologist may insert needle electrodes at different sites depending on your symptoms.  Sensations. The electrodes will at times transmit a tiny electrical current that you may feel as a twinge or spasm. The needle electrode may cause discomfort or pain that usually ends shortly after the needle is removed. If you are concerned about discomfort or pain, you may want to talk to the neurologist about taking a short break during the exam.  Instructions. During the needle EMG, the neurologist will assess whether there is any spontaneous electrical activity when the muscle is at rest - activity that isn't present in healthy muscle tissue - and the degree of activity when you slightly contract the muscle.  He or she will give you instructions on resting and contracting a muscle at appropriate times. Depending on what muscles and nerves the neurologist is examining, he or she may ask you to change positions during the exam.  After your EMG You may  experience some temporary, minor bruising where the needle electrode was inserted into your muscle. This bruising should fade within several days. If it persists, contact your primary care doctor.

## 2024-04-14 ENCOUNTER — Other Ambulatory Visit: Payer: Self-pay

## 2024-04-14 ENCOUNTER — Ambulatory Visit: Attending: Neurology

## 2024-04-14 VITALS — BP 114/70

## 2024-04-14 DIAGNOSIS — M6281 Muscle weakness (generalized): Secondary | ICD-10-CM | POA: Diagnosis present

## 2024-04-14 DIAGNOSIS — R200 Anesthesia of skin: Secondary | ICD-10-CM | POA: Insufficient documentation

## 2024-04-14 DIAGNOSIS — M542 Cervicalgia: Secondary | ICD-10-CM | POA: Diagnosis present

## 2024-04-14 DIAGNOSIS — M79602 Pain in left arm: Secondary | ICD-10-CM | POA: Insufficient documentation

## 2024-04-14 DIAGNOSIS — M25512 Pain in left shoulder: Secondary | ICD-10-CM | POA: Diagnosis present

## 2024-04-14 NOTE — Therapy (Signed)
 OUTPATIENT PHYSICAL THERAPY CERVICAL EVALUATION   Patient Name: [name] MRN: [id number] DOB:1965-12-14, 58 y.o., female Today's Date: 04/14/2024  END OF SESSION:  PT End of Session - 04/14/24 0926     Visit Number 1    Number of Visits 9    Date for PT Re-Evaluation 05/12/24    Authorization Type [employer]    PT Start Time 0930    PT Stop Time 1015    PT Time Calculation (min) 45 min    Activity Tolerance Patient tolerated treatment well    Behavior During Therapy [facility] for tasks assessed/performed          Past Medical History:  Diagnosis Date   Anemia    CAD (coronary artery disease)    PCI with stent to RCA in 2019   CHF (congestive heart failure) (HCC) 04/2011   EF 15-20% from echo 05/19/11   DOE (dyspnea on exertion)    DVT (deep venous thrombosis) (HCC) 06/2011   LLE   Fatigue    HTN (hypertension)    Hyperlipidemia    Menorrhagia    with iron  deficient anemia   NSTEMI (non-ST elevated myocardial infarction) (HCC) 10/26/2017   NSTEMI (non-ST elevated myocardial infarction) (HCC) 10/27/2017   Obesity    Orthopnea    Type II diabetes mellitus (HCC)    Past Surgical History:  Procedure Laterality Date   CORONARY ANGIOPLASTY WITH STENT PLACEMENT  2013;  10/27/2017   CORONARY BALLOON ANGIOPLASTY N/A 05/26/2022   Procedure: CORONARY BALLOON ANGIOPLASTY;  Surgeon: Elmira Newman PARAS, MD;  [facility];  Service: Cardiovascular;  Laterality: N/A;   CORONARY STENT INTERVENTION N/A 10/27/2017   Procedure: CORONARY STENT INTERVENTION;  Surgeon: Elmira Newman PARAS, MD;  [facility];  Service: Cardiovascular;  Laterality: N/A;   CORONARY STENT INTERVENTION N/A 03/10/2022   Procedure: CORONARY STENT INTERVENTION;  Surgeon: Ladona Heinz, MD;  [facility];  Service: Cardiovascular;  Laterality: N/A;   CORONARY STENT INTERVENTION N/A 03/11/2022   Procedure: CORONARY STENT INTERVENTION;  Surgeon: Elmira Newman PARAS,  MD;  [facility];  Service: Cardiovascular;  Laterality: N/A;  aborted   CORONARY STENT INTERVENTION N/A 10/20/2022   Procedure: CORONARY STENT INTERVENTION;  Surgeon: Elmira Newman PARAS, MD;  [facility];  Service: Cardiovascular;  Laterality: N/A;   CORONARY ULTRASOUND/IVUS N/A 05/26/2022   Procedure: Intravascular Ultrasound/IVUS;  Surgeon: Elmira Newman PARAS, MD;  [facility];  Service: Cardiovascular;  Laterality: N/A;   CORONARY ULTRASOUND/IVUS N/A 10/20/2022   Procedure: Intravascular Ultrasound/IVUS;  Surgeon: Elmira Newman PARAS, MD;  [facility];  Service: Cardiovascular;  Laterality: N/A;   LEFT HEART CATH AND CORONARY ANGIOGRAPHY N/A 10/27/2017   Procedure: LEFT HEART CATH AND CORONARY ANGIOGRAPHY;  Surgeon: Elmira Newman PARAS, MD;  [facility];  Service: Cardiovascular;  Laterality: N/A;   LEFT HEART CATH AND CORONARY ANGIOGRAPHY N/A 11/17/2019   Procedure: LEFT HEART CATH AND CORONARY ANGIOGRAPHY;  Surgeon: Elmira Newman PARAS, MD;  [facility];  Service: Cardiovascular;  Laterality: N/A;   LEFT HEART CATH AND CORONARY ANGIOGRAPHY N/A 03/10/2022   Procedure: LEFT HEART CATH AND CORONARY ANGIOGRAPHY;  Surgeon: Ladona Heinz, MD;  [facility];  Service: Cardiovascular;  Laterality: N/A;   LEFT HEART CATH AND CORONARY ANGIOGRAPHY N/A 07/27/2022   Procedure: LEFT HEART CATH AND CORONARY ANGIOGRAPHY;  Surgeon: Ladona Heinz, MD;  [facility];  Service: Cardiovascular;  Laterality: N/A;   LEFT HEART CATH AND CORONARY ANGIOGRAPHY N/A 10/20/2022   Procedure: LEFT HEART CATH AND CORONARY ANGIOGRAPHY;  Surgeon: Elmira Newman PARAS, MD;  [facility];  Service: Cardiovascular;  Laterality: N/A;   LEFT HEART CATHETERIZATION WITH CORONARY ANGIOGRAM N/A 11/17/2011   Procedure: LEFT HEART CATHETERIZATION WITH CORONARY ANGIOGRAM;  Surgeon: Erick JONELLE Bergamo, MD;   Location: [facility];  Service: Cardiovascular;  Laterality: N/A;   OVARIAN CYST REMOVAL     TUBAL LIGATION  2006   ULTRASOUND GUIDANCE FOR VASCULAR ACCESS  10/27/2017   Procedure: Ultrasound Guidance For Vascular Access;  Surgeon: Elmira Newman PARAS, MD;  [facility];  Service: Cardiovascular;;   Patient Active Problem List   Diagnosis Date Noted   Coronary artery disease involving native coronary artery of native heart without angina pectoris    Pleuritic pain 07/25/2022   History of non-ST elevation myocardial infarction (NSTEMI)    History of coronary angioplasty with insertion of stent    Mixed hyperlipidemia    Post PTCA 05/26/2022   Chronic iron  deficiency anemia 03/10/2022   Chronic diastolic CHF (congestive heart failure) (HCC) 03/10/2022   Non-insulin  dependent type 2 diabetes mellitus (HCC) 03/10/2022   Morbidly obese (HCC) 08/19/2020   Uncontrolled type 2 diabetes mellitus with hyperglycemia (HCC) 08/19/2020   Unstable angina (HCC) 11/16/2019   Non-ST elevation (NSTEMI) myocardial infarction (HCC) 10/27/2017   OSA (obstructive sleep apnea) 02/21/2013   Atherosclerosis of native coronary artery of native heart with stable angina pectoris (HCC) 11/18/2011   Dyslipidemia 11/18/2011   Precordial pain 11/17/2011   Abnormal cardiovascular stress test 11/17/2011   DVT (deep venous thrombosis) (HCC) 11/01/2011   Essential hypertension 11/01/2011   Blood loss anemia 11/01/2011   Exertional dyspnea 08/19/2011    PCP: Schuyler Lesser, PA-C  REFERRING PROVIDER: Tobie Tonita POUR, DO  REFERRING DIAG: R20.0 (ICD-10-CM) - Left arm numbness  THERAPY DIAG:  Neck pain  Pain in left arm  Muscle weakness (generalized)  Acute pain of left shoulder  Rationale for Evaluation and Treatment: Rehabilitation  ONSET DATE: 04/10/2024- date of referral  SUBJECTIVE:                                                                                                                                                                                                          SUBJECTIVE STATEMENT: Pt reports a month ago, she woke up in the middle of the night with L arm pain, numbness in L side of  face, heavy feeling in arm and inability to lift arm. She went to ED but MRI was negative brain.Pt still reports of L sided neck pain, shoulder pain and reporting tingling in the whole hand and the whole arm. There is always constant pain and numbness in the L shoulder. Pt also reports intermittent facial numbness on left side of the face when she has increased pain/tingling in L UE. Hand dominance: Right  PERTINENT HISTORY:  CHF, CAD, DM  PAIN:  Are you having pain? Yes: NPRS scale: 10/10 Pain location: L side of neck, L arm Pain description: radiating, heavy, tingling Aggravating factors: unsure Relieving factors: unsure  PRECAUTIONS: None  RED FLAGS: None     WEIGHT BEARING RESTRICTIONS: No  FALLS:  Has patient fallen in last 6 months? No  PLOF: Independent  PATIENT GOALS: improve pain    OBJECTIVE:  Note: Objective measures were completed at Evaluation unless otherwise noted.  DIAGNOSTIC FINDINGS:  MRI of Brain: 02/17/24   IMPRESSION: 1. No acute intracranial abnormality. 2. Moderate cerebral white matter disease, nonspecific, but most likely related chronic microvascular ischemic disease. Probable tiny remote left cerebellar infarct.  PATIENT SURVEYS:   COGNITION: Overall cognitive status: Within functional limits for tasks assessed  PALPATION: GH capsule hypomobility grossly   CERVICAL ROM:   Active ROM A/PROM (deg) eval  Flexion 75  Extension 70  Right lateral flexion   Left lateral flexion   Right rotation 75  Left rotation 80   (Blank rows = not tested)  UPPER EXTREMITY ROM:  Active/Passive ROM Right eval Left eval  Shoulder flexion 180/180 120/120  Shoulder extension 180/180 90/100  Shoulder abduction    Shoulder  adduction    Shoulder extension    Shoulder internal rotation T6 T12  Shoulder external rotation T2 L occiput  Elbow flexion    Elbow extension    Wrist flexion    Wrist extension    Wrist ulnar deviation    Wrist radial deviation    Wrist pronation    Wrist supination     (Blank rows = not tested)  UPPER EXTREMITY MMT:  MMT Right eval Left eval  Shoulder flexion 5/5 3+/5  Shoulder extension    Shoulder abduction 5/5 3+/5  Shoulder adduction    Shoulder extension    Shoulder internal rotation 5/5 3+/5  Shoulder external rotation 5/5 3+/5  Middle trapezius    Lower trapezius    Elbow flexion 5/5 3+/5  Elbow extension 5/5 3+/5  Wrist flexion    Wrist extension    Wrist ulnar deviation    Wrist radial deviation    Wrist pronation    Wrist supination    Grip strength 69 25   (Blank rows = not tested)   TREATMENT DATE:  Pt educated on taking BP measurements every morning before she takes her meds and measuring at least once a day. Printed BP logs.  Pt educated on findings of the evaluation. Pt educated that she potentially have adhesive capsulitis in L shoulder. We discussed overall pathophysiology of adhesive capsulitis and treatment options. Printed reference document for patient to read on.  Also educated pt that her whole L arm numbness, pain and weakness along with facial numbness intermittently cannot be explained with adhesive capsulitis.   Reviewed wall slides in scaption with patient and asked patient to perform these 10-15x/5 x/day.  PATIENT EDUCATION:  Education details: see above Person educated: Patient Education method: Explanation Education comprehension: verbalized understanding  HOME EXERCISE PROGRAM: 04/14/24: Standing wall slides into scaption: 10x  ASSESSMENT:  CLINICAL IMPRESSION: Patient is a 58 y.o. female who was  seen today for physical therapy evaluation and treatment for neck pain with cervical radiculopathy. Objective impairments include decreased L shoulder AROM, decreased gross strength of L UE. Patient's L arm neural symptoms along with facial numbness with normal and pain free cervical AROM  are inconsistent with potential cervical radiculopathy. Patient's vital signs were normal during the session and unsure if any cardiac pathology is responsible for her symptoms based on PMH of CHF. Patient's L shoulder AROM limitations (AROM = PROM) along with insidious etiology of her symptoms and pain pattern is consistent with potentially adhesive capsulitis of L shoulder. These impairments are limiting patient from performing performing ADLs, self care, lifting, carrying, sleeping, driving with L UE. Patient will benefit from skilled PT to address these impairments and improve overall function.   OBJECTIVE IMPAIRMENTS: decreased ROM, decreased strength, hypomobility, increased fascial restrictions, increased muscle spasms, impaired flexibility, impaired UE functional use, postural dysfunction, and pain.   ACTIVITY LIMITATIONS: carrying and sleeping  PARTICIPATION LIMITATIONS: meal prep, cleaning, laundry, driving, shopping, and community activity  PERSONAL FACTORS: Age and Time since onset of injury/illness/exacerbation are also affecting patient's functional outcome.   REHAB POTENTIAL: Good  CLINICAL DECISION MAKING: Stable/uncomplicated  EVALUATION COMPLEXITY: Moderate   GOALS: Goals reviewed with patient? Yes  SHORT TERM GOALS: No STG established due to POC <4 weeks   LONG TERM GOALS: Target date: 05/12/2024    Patient will be 100% compliant with HEP to self manage symptoms at home  Baseline: initiated 04/14/24 Goal status: INITIAL  2.  Patient will demo at least 10% improvement on UEFS to improve overall function. Baseline: TBD Goal status: INITIAL  3.  Patient will demo grip strength in L  UE to be >50 lbs to improve ability to hold and carry objects in L hand Baseline: 29 lbs (04/14/24) Goal status: INITIAL  4.  Patient will demo L shoulder AROM to be above 160 deg to improve ability to reach in overhead cabinets. Baseline: 120 deg Goal status: INITIAL  5.  Pt will report overall max pain of <5/10 in L shoulder and L arm to improve function with L arm Baseline: 10/10 (04/14/24) Goal status: INITIAL   PLAN:  PT FREQUENCY: 2x/week  PT DURATION: 4 weeks  PLANNED INTERVENTIONS: 97164- PT Re-evaluation, 97750- Physical Performance Testing, 97110-Therapeutic exercises, 97530- Therapeutic activity, 97112- Neuromuscular re-education, 97535- Self Care, [id number]- Manual therapy, [id number]- Electrical stimulation (unattended), Patient/Family education, Joint mobilization, Joint manipulation, Spinal manipulation, Spinal mobilization, DME instructions, Cryotherapy, and Moist heat  PLAN FOR NEXT SESSION: Adhesive capsulitis, progress HEP   Raj LOISE Blanch, PT 04/14/2024, 11:24 AM

## 2024-04-18 ENCOUNTER — Ambulatory Visit

## 2024-04-19 ENCOUNTER — Ambulatory Visit

## 2024-04-24 ENCOUNTER — Other Ambulatory Visit
Admission: RE | Admit: 2024-04-24 | Discharge: 2024-04-24 | Disposition: A | Discharge status: Home / Self Care | Attending: Student in an Organized Health Care Education/Training Program | Admitting: Student in an Organized Health Care Education/Training Program

## 2024-04-24 DIAGNOSIS — R053 Chronic cough: Secondary | ICD-10-CM | POA: Diagnosis present

## 2024-04-24 LAB — CBC WITH DIFFERENTIAL/PLATELET
Abs Immature Granulocytes: 0.02 K/uL (ref 0.00–0.07)
Basophils Absolute: 0 K/uL (ref 0.0–0.1)
Basophils Relative: 1 %
Eosinophils Absolute: 0.1 K/uL (ref 0.0–0.5)
Eosinophils Relative: 2 %
HCT: 35 % — ABNORMAL LOW (ref 36.0–46.0)
Hemoglobin: 11.3 g/dL — ABNORMAL LOW (ref 12.0–15.0)
Immature Granulocytes: 0 %
Lymphocytes Relative: 22 %
Lymphs Abs: 1 K/uL (ref 0.7–4.0)
MCH: 24.5 pg — ABNORMAL LOW (ref 26.0–34.0)
MCHC: 32.3 g/dL (ref 30.0–36.0)
MCV: 75.8 fL — ABNORMAL LOW (ref 80.0–100.0)
Monocytes Absolute: 0.4 K/uL (ref 0.1–1.0)
Monocytes Relative: 8 %
Neutro Abs: 3 K/uL (ref 1.7–7.7)
Neutrophils Relative %: 67 %
Platelets: 270 K/uL (ref 150–400)
RBC: 4.62 MIL/uL (ref 3.87–5.11)
RDW: 14.3 % (ref 11.5–15.5)
WBC: 4.5 K/uL (ref 4.0–10.5)
nRBC: 0 % (ref 0.0–0.2)

## 2024-04-25 ENCOUNTER — Ambulatory Visit: Attending: Neurology

## 2024-04-25 ENCOUNTER — Ambulatory Visit: Payer: Self-pay | Admitting: Student in an Organized Health Care Education/Training Program

## 2024-04-25 DIAGNOSIS — M542 Cervicalgia: Secondary | ICD-10-CM | POA: Insufficient documentation

## 2024-04-25 DIAGNOSIS — M79602 Pain in left arm: Secondary | ICD-10-CM | POA: Insufficient documentation

## 2024-04-25 DIAGNOSIS — M6281 Muscle weakness (generalized): Secondary | ICD-10-CM | POA: Diagnosis present

## 2024-04-25 DIAGNOSIS — M25512 Pain in left shoulder: Secondary | ICD-10-CM | POA: Diagnosis present

## 2024-04-25 NOTE — Therapy (Signed)
 OUTPATIENT PHYSICAL THERAPY TREATMENT NOTE   Patient Name: [name] MRN: [id number] DOB:August 22, 1966, 58 y.o., female Today's Date: 04/25/2024  END OF SESSION:  PT End of Session - 04/25/24 0933     Visit Number 2    Number of Visits 9    Date for PT Re-Evaluation 05/12/24    Authorization Type [employer]    PT Start Time 0930    PT Stop Time 1015    PT Time Calculation (min) 45 min    Activity Tolerance Patient tolerated treatment well    Behavior During Therapy [facility] for tasks assessed/performed           Past Medical History:  Diagnosis Date   Anemia    CAD (coronary artery disease)    PCI with stent to RCA in 2019   CHF (congestive heart failure) (HCC) 04/2011   EF 15-20% from echo 05/19/11   DOE (dyspnea on exertion)    DVT (deep venous thrombosis) (HCC) 06/2011   LLE   Fatigue    HTN (hypertension)    Hyperlipidemia    Menorrhagia    with iron  deficient anemia   NSTEMI (non-ST elevated myocardial infarction) (HCC) 10/26/2017   NSTEMI (non-ST elevated myocardial infarction) (HCC) 10/27/2017   Obesity    Orthopnea    Type II diabetes mellitus (HCC)    Past Surgical History:  Procedure Laterality Date   CORONARY ANGIOPLASTY WITH STENT PLACEMENT  2013;  10/27/2017   CORONARY BALLOON ANGIOPLASTY N/A 05/26/2022   Procedure: CORONARY BALLOON ANGIOPLASTY;  Surgeon: Elmira Newman PARAS, MD;  [facility];  Service: Cardiovascular;  Laterality: N/A;   CORONARY STENT INTERVENTION N/A 10/27/2017   Procedure: CORONARY STENT INTERVENTION;  Surgeon: Elmira Newman PARAS, MD;  [facility];  Service: Cardiovascular;  Laterality: N/A;   CORONARY STENT INTERVENTION N/A 03/10/2022   Procedure: CORONARY STENT INTERVENTION;  Surgeon: Ladona Heinz, MD;  [facility];  Service: Cardiovascular;  Laterality: N/A;   CORONARY STENT INTERVENTION N/A 03/11/2022   Procedure: CORONARY STENT INTERVENTION;  Surgeon: Elmira Newman PARAS, MD;   [facility];  Service: Cardiovascular;  Laterality: N/A;  aborted   CORONARY STENT INTERVENTION N/A 10/20/2022   Procedure: CORONARY STENT INTERVENTION;  Surgeon: Elmira Newman PARAS, MD;  [facility];  Service: Cardiovascular;  Laterality: N/A;   CORONARY ULTRASOUND/IVUS N/A 05/26/2022   Procedure: Intravascular Ultrasound/IVUS;  Surgeon: Elmira Newman PARAS, MD;  [facility];  Service: Cardiovascular;  Laterality: N/A;   CORONARY ULTRASOUND/IVUS N/A 10/20/2022   Procedure: Intravascular Ultrasound/IVUS;  Surgeon: Elmira Newman PARAS, MD;  [facility];  Service: Cardiovascular;  Laterality: N/A;   LEFT HEART CATH AND CORONARY ANGIOGRAPHY N/A 10/27/2017   Procedure: LEFT HEART CATH AND CORONARY ANGIOGRAPHY;  Surgeon: Elmira Newman PARAS, MD;  [facility];  Service: Cardiovascular;  Laterality: N/A;   LEFT HEART CATH AND CORONARY ANGIOGRAPHY N/A 11/17/2019   Procedure: LEFT HEART CATH AND CORONARY ANGIOGRAPHY;  Surgeon: Elmira Newman PARAS, MD;  [facility];  Service: Cardiovascular;  Laterality: N/A;   LEFT HEART CATH AND CORONARY ANGIOGRAPHY N/A 03/10/2022   Procedure: LEFT HEART CATH AND CORONARY ANGIOGRAPHY;  Surgeon: Ladona Heinz, MD;  [facility];  Service: Cardiovascular;  Laterality: N/A;   LEFT HEART CATH AND CORONARY ANGIOGRAPHY N/A 07/27/2022   Procedure: LEFT HEART CATH AND CORONARY ANGIOGRAPHY;  Surgeon: Ladona Heinz, MD;  [facility];  Service: Cardiovascular;  Laterality: N/A;   LEFT HEART CATH AND CORONARY ANGIOGRAPHY N/A 10/20/2022   Procedure: LEFT HEART CATH AND CORONARY ANGIOGRAPHY;  Surgeon: Elmira Newman PARAS, MD;  [facility];  Service: Cardiovascular;  Laterality: N/A;   LEFT HEART CATHETERIZATION WITH CORONARY ANGIOGRAM N/A 11/17/2011   Procedure: LEFT HEART CATHETERIZATION WITH CORONARY ANGIOGRAM;  Surgeon: Erick JONELLE Bergamo, MD;   Location: [facility] CATH LAB;  Service: Cardiovascular;  Laterality: N/A;   OVARIAN CYST REMOVAL     TUBAL LIGATION  2006   ULTRASOUND GUIDANCE FOR VASCULAR ACCESS  10/27/2017   Procedure: Ultrasound Guidance For Vascular Access;  Surgeon: Elmira Newman PARAS, MD;  [facility];  Service: Cardiovascular;;   Patient Active Problem List   Diagnosis Date Noted   Coronary artery disease involving native coronary artery of native heart without angina pectoris    Pleuritic pain 07/25/2022   History of non-ST elevation myocardial infarction (NSTEMI)    History of coronary angioplasty with insertion of stent    Mixed hyperlipidemia    Post PTCA 05/26/2022   Chronic iron  deficiency anemia 03/10/2022   Chronic diastolic CHF (congestive heart failure) (HCC) 03/10/2022   Non-insulin  dependent type 2 diabetes mellitus (HCC) 03/10/2022   Morbidly obese (HCC) 08/19/2020   Uncontrolled type 2 diabetes mellitus with hyperglycemia (HCC) 08/19/2020   Unstable angina (HCC) 11/16/2019   Non-ST elevation (NSTEMI) myocardial infarction (HCC) 10/27/2017   OSA (obstructive sleep apnea) 02/21/2013   Atherosclerosis of native coronary artery of native heart with stable angina pectoris (HCC) 11/18/2011   Dyslipidemia 11/18/2011   Precordial pain 11/17/2011   Abnormal cardiovascular stress test 11/17/2011   DVT (deep venous thrombosis) (HCC) 11/01/2011   Essential hypertension 11/01/2011   Blood loss anemia 11/01/2011   Exertional dyspnea 08/19/2011    PCP: Schuyler Lesser, PA-C  REFERRING PROVIDER: Alpha Mysliwiec, Donika K, DO  REFERRING DIAG: R20.0 (ICD-10-CM) - Left arm numbness  THERAPY DIAG:  Neck pain  Pain in left arm  Muscle weakness (generalized)  Acute pain of left shoulder  Rationale for Evaluation and Treatment: Rehabilitation  ONSET DATE: 04/10/2024- date of referral  SUBJECTIVE:                                                                                                                                                                                                          SUBJECTIVE STATEMENT: Pt reports she is doing okay.  Hand dominance: Right  PERTINENT HISTORY:  CHF, CAD, DM  PAIN:  Are you  having pain? Yes: NPRS scale: 6/10 Pain location: L side of neck, L arm Pain description: radiating, heavy, tingling Aggravating factors: unsure Relieving factors: unsure  PRECAUTIONS: None  RED FLAGS: None     WEIGHT BEARING RESTRICTIONS: No  FALLS:  Has patient fallen in last 6 months? No  PLOF: Independent  PATIENT GOALS: improve pain    OBJECTIVE:  Note: Objective measures were completed at Evaluation unless otherwise noted.  DIAGNOSTIC FINDINGS:  MRI of Brain: 02/17/24   IMPRESSION: 1. No acute intracranial abnormality. 2. Moderate cerebral white matter disease, nonspecific, but most likely related chronic microvascular ischemic disease. Probable tiny remote left cerebellar infarct.  PATIENT SURVEYS:   COGNITION: Overall cognitive status: Within functional limits for tasks assessed  PALPATION: GH capsule hypomobility grossly   CERVICAL ROM:   Active ROM A/PROM (deg) eval  Flexion 75  Extension 70  Right lateral flexion   Left lateral flexion   Right rotation 75  Left rotation 80   (Blank rows = not tested)  UPPER EXTREMITY ROM:  Active/Passive ROM Right eval Left eval  Shoulder flexion 180/180 120/120  Shoulder extension 180/180 90/100  Shoulder abduction    Shoulder adduction    Shoulder extension    Shoulder internal rotation T6 T12  Shoulder external rotation T2 L occiput  Elbow flexion    Elbow extension    Wrist flexion    Wrist extension    Wrist ulnar deviation    Wrist radial deviation    Wrist pronation    Wrist supination     (Blank rows = not tested)  UPPER EXTREMITY MMT:  MMT Right eval Left eval  Shoulder flexion 5/5 3+/5  Shoulder extension    Shoulder abduction 5/5 3+/5  Shoulder adduction     Shoulder extension    Shoulder internal rotation 5/5 3+/5  Shoulder external rotation 5/5 3+/5  Middle trapezius    Lower trapezius    Elbow flexion 5/5 3+/5  Elbow extension 5/5 3+/5  Wrist flexion    Wrist extension    Wrist ulnar deviation    Wrist radial deviation    Wrist pronation    Wrist supination    Grip strength 69 25   (Blank rows = not tested)   TREATMENT DATE:                                                                                                                                STM to bil scalenes, upper trap and levator scapulae Passive stretching to R levator scapulae, L scalenes Passive cervical retractions: 30x STM to L deltoids, proximal biceps tendon Scaption slides: 20x in pain free range, verbal cues to avoid upper trap compensation Seated shoulder press: AA with PT with tactile cues to avoid upper trap compensation Supine chest press with cane: 3 x 10 Supine cane flexion: 2 x 10   PATIENT EDUCATION:  Education details: see above Person educated: Patient Education method: Explanation  Education comprehension: verbalized understanding  HOME EXERCISE PROGRAM: 04/14/24: Standing wall slides into scaption: 10x 04/25/24:Access Code: [email or web address] URL: [email or web address] Date: 04/25/2024 Prepared by: Raj Blanch  Exercises - Shoulder Flexion Wall Slide with Towel  - 1-2 x daily - 7 x weekly - 2 sets - 10 reps - Supine Shoulder Press with Dowel  - 1-2 x daily - 7 x weekly - 2 sets - 10 reps - Supine Shoulder Flexion with Dowel  - 1-2 x daily - 7 x weekly - 2 sets - 10 reps  ASSESSMENT:  CLINICAL IMPRESSION: Pt tolerated manual therapy well. Reported improved pain with left cervical ROM and reported improved pain overall to <3/10 at end of the session. Progressed her HEP today.  OBJECTIVE IMPAIRMENTS: decreased ROM, decreased strength, hypomobility, increased fascial restrictions, increased muscle spasms, impaired flexibility,  impaired UE functional use, postural dysfunction, and pain.   ACTIVITY LIMITATIONS: carrying and sleeping  PARTICIPATION LIMITATIONS: meal prep, cleaning, laundry, driving, shopping, and community activity  PERSONAL FACTORS: Age and Time since onset of injury/illness/exacerbation are also affecting patient's functional outcome.   REHAB POTENTIAL: Good  CLINICAL DECISION MAKING: Stable/uncomplicated  EVALUATION COMPLEXITY: Moderate   GOALS: Goals reviewed with patient? Yes  SHORT TERM GOALS: No STG established due to POC <4 weeks   LONG TERM GOALS: Target date: 05/12/2024    Patient will be 100% compliant with HEP to self manage symptoms at home  Baseline: initiated 04/14/24 Goal status: INITIAL  2.  Patient will demo at least 10% improvement on UEFS to improve overall function. Baseline: TBD Goal status: INITIAL  3.  Patient will demo grip strength in L UE to be >50 lbs to improve ability to hold and carry objects in L hand Baseline: 29 lbs (04/14/24) Goal status: INITIAL  4.  Patient will demo L shoulder AROM to be above 160 deg to improve ability to reach in overhead cabinets. Baseline: 120 deg Goal status: INITIAL  5.  Pt will report overall max pain of <5/10 in L shoulder and L arm to improve function with L arm Baseline: 10/10 (04/14/24) Goal status: INITIAL   PLAN:  PT FREQUENCY: 2x/week  PT DURATION: 4 weeks  PLANNED INTERVENTIONS: 97164- PT Re-evaluation, 97750- Physical Performance Testing, 97110-Therapeutic exercises, 97530- Therapeutic activity, 97112- Neuromuscular re-education, 97535- Self Care, [id number]- Manual therapy, G0283- Electrical stimulation (unattended), Patient/Family education, Joint mobilization, Joint manipulation, Spinal manipulation, Spinal mobilization, DME instructions, Cryotherapy, and Moist heat  PLAN FOR NEXT SESSION: Adhesive capsulitis, progress HEP   Raj LOISE Blanch, PT 04/25/2024, 10:20 AM

## 2024-04-26 LAB — ALLERGEN PANEL (27) + IGE
Alternaria Alternata IgE: <0.1 kU/L
Aspergillus Fumigatus IgE: <0.1 kU/L
Bahia Grass IgE: <0.1 kU/L
Bermuda Grass IgE: <0.1 kU/L
Cat Dander IgE: <0.1 kU/L
Cedar, Mountain IgE: <0.1 kU/L
Cladosporium Herbarum IgE: <0.1 kU/L
Cocklebur IgE: <0.1 kU/L
Cockroach, American IgE: <0.1 kU/L
Common Silver Birch IgE: <0.1 kU/L
D Farinae IgE: <0.1 kU/L
D Pteronyssinus IgE: <0.1 kU/L
Dog Dander IgE: <0.1 kU/L
Elm, American IgE: <0.1 kU/L
Hickory, White IgE: <0.1 kU/L
IgE (Immunoglobulin E), Serum: 165 [IU]/mL (ref 6–495)
Johnson Grass IgE: <0.1 kU/L
Kentucky Bluegrass IgE: <0.1 kU/L
Maple/Box Elder IgE: <0.1 kU/L
Mucor Racemosus IgE: <0.1 kU/L
Oak, White IgE: <0.1 kU/L
Penicillium Chrysogen IgE: <0.1 kU/L
Pigweed, Rough IgE: <0.1 kU/L
Plantain, English IgE: <0.1 kU/L
Ragweed, Short IgE: <0.1 kU/L
Setomelanomma Rostrat: <0.1 kU/L
Timothy Grass IgE: <0.1 kU/L
White Mulberry IgE: <0.1 kU/L

## 2024-04-28 ENCOUNTER — Ambulatory Visit

## 2024-04-28 DIAGNOSIS — M79602 Pain in left arm: Secondary | ICD-10-CM

## 2024-04-28 DIAGNOSIS — M25512 Pain in left shoulder: Secondary | ICD-10-CM

## 2024-04-28 DIAGNOSIS — M542 Cervicalgia: Secondary | ICD-10-CM

## 2024-04-28 DIAGNOSIS — M6281 Muscle weakness (generalized): Secondary | ICD-10-CM

## 2024-04-28 NOTE — Therapy (Signed)
 OUTPATIENT PHYSICAL THERAPY TREATMENT NOTE   Patient Name: [name] MRN: [id number] DOB:04-29-1966, 58 y.o., female Today's Date: 04/28/2024  END OF SESSION:  PT End of Session - 04/28/24 0804     Visit Number 3    Number of Visits 9    Date for PT Re-Evaluation 05/12/24    Authorization Type [employer]    PT Start Time [phone]    PT Stop Time 0845    PT Time Calculation (min) 50 min    Activity Tolerance Patient tolerated treatment well    Behavior During Therapy [facility] for tasks assessed/performed            Past Medical History:  Diagnosis Date   Anemia    CAD (coronary artery disease)    PCI with stent to RCA in 2019   CHF (congestive heart failure) (HCC) 04/2011   EF 15-20% from echo 05/19/11   DOE (dyspnea on exertion)    DVT (deep venous thrombosis) (HCC) 06/2011   LLE   Fatigue    HTN (hypertension)    Hyperlipidemia    Menorrhagia    with iron  deficient anemia   NSTEMI (non-ST elevated myocardial infarction) (HCC) 10/26/2017   NSTEMI (non-ST elevated myocardial infarction) (HCC) 10/27/2017   Obesity    Orthopnea    Type II diabetes mellitus (HCC)    Past Surgical History:  Procedure Laterality Date   CORONARY ANGIOPLASTY WITH STENT PLACEMENT  2013;  10/27/2017   CORONARY BALLOON ANGIOPLASTY N/A 05/26/2022   Procedure: CORONARY BALLOON ANGIOPLASTY;  Surgeon: Elmira Newman PARAS, MD;  Location: [facility];  Service: Cardiovascular;  Laterality: N/A;   CORONARY STENT INTERVENTION N/A 10/27/2017   Procedure: CORONARY STENT INTERVENTION;  Surgeon: Elmira Newman PARAS, MD;  Location: [facility];  Service: Cardiovascular;  Laterality: N/A;   CORONARY STENT INTERVENTION N/A 03/10/2022   Procedure: CORONARY STENT INTERVENTION;  Surgeon: Ladona Heinz, MD;  Location: [facility];  Service: Cardiovascular;  Laterality: N/A;   CORONARY STENT INTERVENTION N/A 03/11/2022   Procedure: CORONARY STENT INTERVENTION;  Surgeon: Elmira Newman PARAS,  MD;  Location: [facility];  Service: Cardiovascular;  Laterality: N/A;  aborted   CORONARY STENT INTERVENTION N/A 10/20/2022   Procedure: CORONARY STENT INTERVENTION;  Surgeon: Elmira Newman PARAS, MD;  Location: [facility];  Service: Cardiovascular;  Laterality: N/A;   CORONARY ULTRASOUND/IVUS N/A 05/26/2022   Procedure: Intravascular Ultrasound/IVUS;  Surgeon: Elmira Newman PARAS, MD;  Location: [facility];  Service: Cardiovascular;  Laterality: N/A;   CORONARY ULTRASOUND/IVUS N/A 10/20/2022   Procedure: Intravascular Ultrasound/IVUS;  Surgeon: Elmira Newman PARAS, MD;  Location: [facility];  Service: Cardiovascular;  Laterality: N/A;   LEFT HEART CATH AND CORONARY ANGIOGRAPHY N/A 10/27/2017   Procedure: LEFT HEART CATH AND CORONARY ANGIOGRAPHY;  Surgeon: Elmira Newman PARAS, MD;  [facility]: [facility];  Service: Cardiovascular;  Laterality: N/A;   LEFT HEART CATH AND CORONARY ANGIOGRAPHY N/A 11/17/2019   Procedure: LEFT HEART CATH AND CORONARY ANGIOGRAPHY;  Surgeon: Elmira Newman PARAS, MD;  Location: [facility];  Service: Cardiovascular;  Laterality: N/A;   LEFT HEART CATH AND CORONARY ANGIOGRAPHY N/A 03/10/2022   Procedure: LEFT HEART CATH AND CORONARY ANGIOGRAPHY;  Surgeon: Ladona Heinz, MD;  [facility]: [facility];  Service: Cardiovascular;  Laterality: N/A;   LEFT HEART CATH AND CORONARY ANGIOGRAPHY N/A 07/27/2022   Procedure: LEFT HEART CATH AND CORONARY ANGIOGRAPHY;  Surgeon: Ladona Heinz, MD;  [facility]: [facility];  Service: Cardiovascular;  Laterality: N/A;   LEFT HEART CATH AND CORONARY ANGIOGRAPHY N/A 10/20/2022   Procedure: LEFT HEART CATH AND CORONARY ANGIOGRAPHY;  Surgeon: Elmira Newman PARAS, MD;  [facility]: [facility];  Service: Cardiovascular;  Laterality: N/A;   LEFT HEART CATHETERIZATION WITH CORONARY ANGIOGRAM N/A 11/17/2011   Procedure: LEFT HEART CATHETERIZATION WITH CORONARY ANGIOGRAM;  Surgeon: Erick JONELLE Bergamo, MD;   Location: [facility] CATH [facility];  Service: Cardiovascular;  Laterality: N/A;   OVARIAN CYST REMOVAL     TUBAL LIGATION  2006   ULTRASOUND GUIDANCE FOR VASCULAR ACCESS  10/27/2017   Procedure: Ultrasound Guidance For Vascular Access;  Surgeon: Elmira Newman PARAS, MD;  Location: [facility];  Service: Cardiovascular;;   Patient Active Problem List   Diagnosis Date Noted   Coronary artery disease involving native coronary artery of native heart without angina pectoris    Pleuritic pain 07/25/2022   History of non-ST elevation myocardial infarction (NSTEMI)    History of coronary angioplasty with insertion of stent    Mixed hyperlipidemia    Post PTCA 05/26/2022   Chronic iron  deficiency anemia 03/10/2022   Chronic diastolic CHF (congestive heart failure) (HCC) 03/10/2022   Non-insulin  dependent type 2 diabetes mellitus (HCC) 03/10/2022   Morbidly obese (HCC) 08/19/2020   Uncontrolled type 2 diabetes mellitus with hyperglycemia (HCC) 08/19/2020   Unstable angina (HCC) 11/16/2019   Non-ST elevation (NSTEMI) myocardial infarction (HCC) 10/27/2017   OSA (obstructive sleep apnea) 02/21/2013   Atherosclerosis of native coronary artery of native heart with stable angina pectoris (HCC) 11/18/2011   Dyslipidemia 11/18/2011   Precordial pain 11/17/2011   Abnormal cardiovascular stress test 11/17/2011   DVT (deep venous thrombosis) (HCC) 11/01/2011   Essential hypertension 11/01/2011   Blood loss anemia 11/01/2011   Exertional dyspnea 08/19/2011    PCP: Schuyler Lesser, PA-C  REFERRING PROVIDER: Vidhi Delellis, Donika K, DO  REFERRING DIAG: R20.0 (ICD-10-CM) - Left arm numbness  THERAPY DIAG:  Pain in left arm  Muscle weakness (generalized)  Acute pain of left shoulder  Neck pain  Rationale for Evaluation and Treatment: Rehabilitation  ONSET DATE: 04/10/2024- date of referral  SUBJECTIVE:                                                                                                                                                                                                          SUBJECTIVE STATEMENT: Pt reports she is doing okay. She was very sore after last time until next day. Currently reports of 0/10 pain Hand  dominance: Right  PERTINENT HISTORY:  CHF, CAD, DM  PAIN:  Are you having pain? Yes: NPRS scale: 6/10 Pain location: L side of neck, L arm Pain description: radiating, heavy, tingling Aggravating factors: unsure Relieving factors: unsure  PRECAUTIONS: None  RED FLAGS: None     WEIGHT BEARING RESTRICTIONS: No  FALLS:  Has patient fallen in last 6 months? No  PLOF: Independent  PATIENT GOALS: improve pain    OBJECTIVE:  Note: Objective measures were completed at Evaluation unless otherwise noted.  DIAGNOSTIC FINDINGS:  MRI of Brain: 02/17/24   IMPRESSION: 1. No acute intracranial abnormality. 2. Moderate cerebral white matter disease, nonspecific, but most likely related chronic microvascular ischemic disease. Probable tiny remote left cerebellar infarct.  PATIENT SURVEYS:   COGNITION: Overall cognitive status: Within functional limits for tasks assessed  PALPATION: GH capsule hypomobility grossly   CERVICAL ROM:   Active ROM A/PROM (deg) eval  Flexion 75  Extension 70  Right lateral flexion   Left lateral flexion   Right rotation 75  Left rotation 80   (Blank rows = not tested)  UPPER EXTREMITY ROM:  Active/Passive ROM Right eval Left eval  Shoulder flexion 180/180 120/120  Shoulder extension 180/180 90/100  Shoulder abduction    Shoulder adduction    Shoulder extension    Shoulder internal rotation T6 T12  Shoulder external rotation T2 L occiput  Elbow flexion    Elbow extension    Wrist flexion    Wrist extension    Wrist ulnar deviation    Wrist radial deviation    Wrist pronation    Wrist supination     (Blank rows = not tested)  UPPER EXTREMITY MMT:  MMT Right eval Left eval  Shoulder flexion  5/5 3+/5  Shoulder extension    Shoulder abduction 5/5 3+/5  Shoulder adduction    Shoulder extension    Shoulder internal rotation 5/5 3+/5  Shoulder external rotation 5/5 3+/5  Middle trapezius    Lower trapezius    Elbow flexion 5/5 3+/5  Elbow extension 5/5 3+/5  Wrist flexion    Wrist extension    Wrist ulnar deviation    Wrist radial deviation    Wrist pronation    Wrist supination    Grip strength 69 25   (Blank rows = not tested)   TREATMENT DATE:                                                                                                                                Passive stretching or R levator scapulae and posterior scalenes and L anterior and middle scalenes Attempted light soft tissue work but patient has difficulty with relaxing and have a suspicion that she may not tolerate lot of manual therapy Supine 90 deg abduction: shoulder ER and IR in pain free range: 20x Supine chest press with 2 lb bar: 3 x 10 pain free range Supine  flexion: 2 x 10  with 2 lb bar up to 90 deg SL shoulder ER: 3 x 10 L only pain free range Standing Biceps flexion: attempted with 3 lbs but pt only able to do about 7 reps before getting fatigued in L arm, dropped to 2lbs and performed 2 x 10 R and L Wrist flexion and extension with yellow thera bar: 20x each way Wrist pronation and supination:: yellow thera bar: 20x each way Pt educated on activity modification at home and taking a rest or break when pain starts to go up to prevent pain from getting to high levels for overall pain management.       PATIENT EDUCATION:  Education details: see above Person educated: Patient Education method: Explanation Education comprehension: verbalized understanding  HOME EXERCISE PROGRAM: 04/14/24: Standing wall slides into scaption: 10x 04/25/24:Access Code: [email or web address] URL: [email or web address] Date: 04/25/2024 Prepared by: Raj Blanch  Exercises - Shoulder Flexion Wall  Slide with Towel  - 1-2 x daily - 7 x weekly - 2 sets - 10 reps - Supine Shoulder Press with Dowel  - 1-2 x daily - 7 x weekly - 2 sets - 10 reps - Supine Shoulder Flexion with Dowel  - 1-2 x daily - 7 x weekly - 2 sets - 10 reps  ASSESSMENT:  CLINICAL IMPRESSION: Pt tolerated session well. Reported minimal to no pain at start of the session with rest but reported pain with movements of shoulder with exercises. We focused pain free ROM with exercises to avoid increase in pain. Progressed overall exercises in the session. Patient will benefit from exercise oriented approach>manual therapy.  OBJECTIVE IMPAIRMENTS: decreased ROM, decreased strength, hypomobility, increased fascial restrictions, increased muscle spasms, impaired flexibility, impaired UE functional use, postural dysfunction, and pain.   ACTIVITY LIMITATIONS: carrying and sleeping  PARTICIPATION LIMITATIONS: meal prep, cleaning, laundry, driving, shopping, and community activity  PERSONAL FACTORS: Age and Time since onset of injury/illness/exacerbation are also affecting patient's functional outcome.   REHAB POTENTIAL: Good  CLINICAL DECISION MAKING: Stable/uncomplicated  EVALUATION COMPLEXITY: Moderate   GOALS: Goals reviewed with patient? Yes  SHORT TERM GOALS: No STG established due to POC <4 weeks   LONG TERM GOALS: Target date: 05/12/2024    Patient will be 100% compliant with HEP to self manage symptoms at home  Baseline: initiated 04/14/24 Goal status: INITIAL  2.  Patient will demo at least 10% improvement on UEFS to improve overall function. Baseline: TBD Goal status: INITIAL  3.  Patient will demo grip strength in L UE to be >50 lbs to improve ability to hold and carry objects in L hand Baseline: 29 lbs (04/14/24) Goal status: INITIAL  4.  Patient will demo L shoulder AROM to be above 160 deg to improve ability to reach in overhead cabinets. Baseline: 120 deg Goal status: INITIAL  5.  Pt will report  overall max pain of <5/10 in L shoulder and L arm to improve function with L arm Baseline: 10/10 (04/14/24) Goal status: INITIAL   PLAN:  PT FREQUENCY: 2x/week  PT DURATION: 4 weeks  PLANNED INTERVENTIONS: [id number]- PT Re-evaluation, 97750- Physical Performance Testing, 97110-Therapeutic exercises, 97530- Therapeutic activity, 97112- Neuromuscular re-education, 97535- Self Care, [id number]- Manual therapy, [id number]- Electrical stimulation (unattended), Patient/Family education, Joint mobilization, Joint manipulation, Spinal manipulation, Spinal mobilization, DME instructions, Cryotherapy, and Moist heat  PLAN FOR NEXT SESSION: Adhesive capsulitis, progress HEP   Raj LOISE Blanch, PT 04/28/2024, 8:52 AM

## 2024-05-01 ENCOUNTER — Ambulatory Visit: Admitting: Student in an Organized Health Care Education/Training Program

## 2024-05-01 DIAGNOSIS — R053 Chronic cough: Secondary | ICD-10-CM | POA: Diagnosis not present

## 2024-05-01 LAB — PULMONARY FUNCTION TEST
DL/VA % pred: 142 %
DL/VA: 6.13 ml/min/mmHg/L
DLCO cor % pred: 113 %
DLCO cor: 21.71 ml/min/mmHg
DLCO unc % pred: 105 %
DLCO unc: 20.17 ml/min/mmHg
FEF 25-75 Pre: 2.79 L/s
FEF2575-%Pred-Pre: 115 %
FEV1-%Pred-Pre: 82 %
FEV1-Pre: 2.04 L
FEV1FVC-%Pred-Pre: 104 %
FEV6-%Pred-Pre: 80 %
FEV6-Pre: 2.47 L
FEV6FVC-%Pred-Pre: 102 %
FVC-%Pred-Pre: 78 %
FVC-Pre: 2.48 L
Pre FEV1/FVC ratio: 82 %
Pre FEV6/FVC Ratio: 100 %
RV % pred: 70 %
RV: 1.27 L
TLC % pred: 76 %
TLC: 3.63 L

## 2024-05-01 NOTE — Progress Notes (Signed)
 Full PFT completed without post. Pt couldn't meet ATS on FVC.

## 2024-05-01 NOTE — Patient Instructions (Signed)
 Full PFT completed without post.

## 2024-05-03 ENCOUNTER — Ambulatory Visit

## 2024-05-03 DIAGNOSIS — M542 Cervicalgia: Secondary | ICD-10-CM

## 2024-05-03 DIAGNOSIS — M79602 Pain in left arm: Secondary | ICD-10-CM

## 2024-05-03 DIAGNOSIS — M25512 Pain in left shoulder: Secondary | ICD-10-CM

## 2024-05-03 DIAGNOSIS — M6281 Muscle weakness (generalized): Secondary | ICD-10-CM

## 2024-05-03 NOTE — Therapy (Signed)
 OUTPATIENT PHYSICAL THERAPY TREATMENT NOTE   Patient Name: [name] MRN: [id number] DOB:Feb 29, 1966, 58 y.o., female Today's Date: 05/03/2024  END OF SESSION:  PT End of Session - 05/03/24 1024     Visit Number 4    Number of Visits 9    Date for PT Re-Evaluation 05/12/24    Authorization Type [employer]    PT Start Time 1020    PT Stop Time 1100    PT Time Calculation (min) 40 min    Activity Tolerance Patient tolerated treatment well    Behavior During Therapy [facility] for tasks assessed/performed             Past Medical History:  Diagnosis Date   Anemia    CAD (coronary artery disease)    PCI with stent to RCA in 2019   CHF (congestive heart failure) (HCC) 04/2011   EF 15-20% from echo 05/19/11   DOE (dyspnea on exertion)    DVT (deep venous thrombosis) (HCC) 06/2011   LLE   Fatigue    HTN (hypertension)    Hyperlipidemia    Menorrhagia    with iron  deficient anemia   NSTEMI (non-ST elevated myocardial infarction) (HCC) 10/26/2017   NSTEMI (non-ST elevated myocardial infarction) (HCC) 10/27/2017   Obesity    Orthopnea    Type II diabetes mellitus (HCC)    Past Surgical History:  Procedure Laterality Date   CORONARY ANGIOPLASTY WITH STENT PLACEMENT  2013;  10/27/2017   CORONARY BALLOON ANGIOPLASTY N/A 05/26/2022   Procedure: CORONARY BALLOON ANGIOPLASTY;  Surgeon: Elmira Newman PARAS, MD;  Location: [facility];  Service: Cardiovascular;  Laterality: N/A;   CORONARY STENT INTERVENTION N/A 10/27/2017   Procedure: CORONARY STENT INTERVENTION;  Surgeon: Elmira Newman PARAS, MD;  Location: [facility];  Service: Cardiovascular;  Laterality: N/A;   CORONARY STENT INTERVENTION N/A 03/10/2022   Procedure: CORONARY STENT INTERVENTION;  Surgeon: Ladona Heinz, MD;  Location: [facility];  Service: Cardiovascular;  Laterality: N/A;   CORONARY STENT INTERVENTION N/A 03/11/2022   Procedure: CORONARY STENT INTERVENTION;  Surgeon: Elmira Newman PARAS,  MD;  Location: [facility];  Service: Cardiovascular;  Laterality: N/A;  aborted   CORONARY STENT INTERVENTION N/A 10/20/2022   Procedure: CORONARY STENT INTERVENTION;  Surgeon: Elmira Newman PARAS, MD;  Location: [facility];  Service: Cardiovascular;  Laterality: N/A;   CORONARY ULTRASOUND/IVUS N/A 05/26/2022   Procedure: Intravascular Ultrasound/IVUS;  Surgeon: Elmira Newman PARAS, MD;  Location: [facility];  Service: Cardiovascular;  Laterality: N/A;   CORONARY ULTRASOUND/IVUS N/A 10/20/2022   Procedure: Intravascular Ultrasound/IVUS;  Surgeon: Elmira Newman PARAS, MD;  Location: [facility];  Service: Cardiovascular;  Laterality: N/A;   LEFT HEART CATH AND CORONARY ANGIOGRAPHY N/A 10/27/2017   Procedure: LEFT HEART CATH AND CORONARY ANGIOGRAPHY;  Surgeon: Elmira Newman PARAS, MD;  Location: [facility];  Service: Cardiovascular;  Laterality: N/A;   LEFT HEART CATH AND CORONARY ANGIOGRAPHY N/A 11/17/2019   Procedure: LEFT HEART CATH AND CORONARY ANGIOGRAPHY;  Surgeon: Elmira Newman PARAS, MD;  Location: [facility];  Service: Cardiovascular;  Laterality: N/A;   LEFT HEART CATH AND CORONARY ANGIOGRAPHY N/A 03/10/2022   Procedure: LEFT HEART CATH AND CORONARY ANGIOGRAPHY;  Surgeon: Ladona Heinz, MD;  Location: [facility];  Service: Cardiovascular;  Laterality: N/A;   LEFT HEART CATH AND CORONARY ANGIOGRAPHY N/A 07/27/2022   Procedure: LEFT HEART CATH AND CORONARY ANGIOGRAPHY;  Surgeon: Ladona Heinz,  MD;  Location: [facility];  Service: Cardiovascular;  Laterality: N/A;   LEFT HEART CATH AND CORONARY ANGIOGRAPHY N/A 10/20/2022   Procedure: LEFT HEART CATH AND CORONARY ANGIOGRAPHY;  Surgeon: Elmira Newman PARAS, MD;  [facility]: [facility];  Service: Cardiovascular;  Laterality: N/A;   LEFT HEART CATHETERIZATION WITH CORONARY ANGIOGRAM N/A 11/17/2011   Procedure: LEFT HEART CATHETERIZATION WITH CORONARY ANGIOGRAM;  Surgeon: Erick JONELLE Bergamo, MD;   Location: [facility] CATH LAB;  Service: Cardiovascular;  Laterality: N/A;   OVARIAN CYST REMOVAL     TUBAL LIGATION  2006   ULTRASOUND GUIDANCE FOR VASCULAR ACCESS  10/27/2017   Procedure: Ultrasound Guidance For Vascular Access;  Surgeon: Elmira Newman PARAS, MD;  Location: [facility];  Service: Cardiovascular;;   Patient Active Problem List   Diagnosis Date Noted   Coronary artery disease involving native coronary artery of native heart without angina pectoris    Pleuritic pain 07/25/2022   History of non-ST elevation myocardial infarction (NSTEMI)    History of coronary angioplasty with insertion of stent    Mixed hyperlipidemia    Post PTCA 05/26/2022   Chronic iron  deficiency anemia 03/10/2022   Chronic diastolic CHF (congestive heart failure) (HCC) 03/10/2022   Non-insulin  dependent type 2 diabetes mellitus (HCC) 03/10/2022   Morbidly obese (HCC) 08/19/2020   Uncontrolled type 2 diabetes mellitus with hyperglycemia (HCC) 08/19/2020   Unstable angina (HCC) 11/16/2019   Non-ST elevation (NSTEMI) myocardial infarction (HCC) 10/27/2017   OSA (obstructive sleep apnea) 02/21/2013   Atherosclerosis of native coronary artery of native heart with stable angina pectoris (HCC) 11/18/2011   Dyslipidemia 11/18/2011   Precordial pain 11/17/2011   Abnormal cardiovascular stress test 11/17/2011   DVT (deep venous thrombosis) (HCC) 11/01/2011   Essential hypertension 11/01/2011   Blood loss anemia 11/01/2011   Exertional dyspnea 08/19/2011    PCP: Schuyler Lesser, PA-C  REFERRING PROVIDER: Tobie Tonita POUR, DO  REFERRING DIAG: R20.0 (ICD-10-CM) - Left arm numbness  THERAPY DIAG:  Pain in left arm  Muscle weakness (generalized)  Acute pain of left shoulder  Neck pain  Rationale for Evaluation and Treatment: Rehabilitation  ONSET DATE: 04/10/2024- date of referral  SUBJECTIVE:                                                                                                                                                                                                          SUBJECTIVE STATEMENT: Pt reports pain is 7/10. Pt reports she was still very sore after last session. Pt reports very minor improvements  overall. Hand dominance: Right  PERTINENT HISTORY:  CHF, CAD, DM  PAIN:  Are you having pain? Yes: NPRS scale: 7/10 Pain location: L side of neck, L arm Pain description: radiating, heavy, tingling Aggravating factors: unsure Relieving factors: unsure  PRECAUTIONS: None  RED FLAGS: None     WEIGHT BEARING RESTRICTIONS: No  FALLS:  Has patient fallen in last 6 months? No  PLOF: Independent  PATIENT GOALS: improve pain    OBJECTIVE:  Note: Objective measures were completed at Evaluation unless otherwise noted.  DIAGNOSTIC FINDINGS:  MRI of Brain: 02/17/24   IMPRESSION: 1. No acute intracranial abnormality. 2. Moderate cerebral white matter disease, nonspecific, but most likely related chronic microvascular ischemic disease. Probable tiny remote left cerebellar infarct.  PATIENT SURVEYS:   COGNITION: Overall cognitive status: Within functional limits for tasks assessed  PALPATION: GH capsule hypomobility grossly   CERVICAL ROM:   Active ROM A/PROM (deg) eval  Flexion 75  Extension 70  Right lateral flexion   Left lateral flexion   Right rotation 75  Left rotation 80   (Blank rows = not tested)  UPPER EXTREMITY ROM:  Active/Passive ROM Right eval Left eval  Shoulder flexion 180/180 120/120  Shoulder extension 180/180 90/100  Shoulder abduction    Shoulder adduction    Shoulder extension    Shoulder internal rotation T6 T12  Shoulder external rotation T2 L occiput  Elbow flexion    Elbow extension    Wrist flexion    Wrist extension    Wrist ulnar deviation    Wrist radial deviation    Wrist pronation    Wrist supination     (Blank rows = not tested)  UPPER EXTREMITY MMT:  MMT Right eval Left eval   Shoulder flexion 5/5 3+/5  Shoulder extension    Shoulder abduction 5/5 3+/5  Shoulder adduction    Shoulder extension    Shoulder internal rotation 5/5 3+/5  Shoulder external rotation 5/5 3+/5  Middle trapezius    Lower trapezius    Elbow flexion 5/5 3+/5  Elbow extension 5/5 3+/5  Wrist flexion    Wrist extension    Wrist ulnar deviation    Wrist radial deviation    Wrist pronation    Wrist supination    Grip strength 69 25   (Blank rows = not tested)   TREATMENT DATE:                                                                                                                               Discussed with patient that patient is not tolerating manual therapy (even light Myofascial release) so we will focus on more exericse based approach and keep hands off to see how she tolerates therapy over next couple of sessions and see if we can make gradual progress Seated scalene stretch: 2 x 20 L only Seated AA shoulder flexion with ball: 2 x 10, reviewed table slides for HEP at home  Standing rows: yellow band: 3 x 10 Finger walks: 2 x 5, after 5x pt reported pain- did shoulder ER and then came back but pt not able to reach that high due to pain in neck and shoulder (1st rep during 1st set, she was able to reach up to 140-150 deg [place]) Seated shoulder ER: yellow band: 2 x 5- unable to do more  Unilateral punches; yellow band chest press: 2 x 10 R and L Positioned about 20 bean bags to R and L of patient on mat table.       PATIENT EDUCATION:  Education details: see above Person educated: Patient Education method: Explanation Education comprehension: verbalized understanding  HOME EXERCISE PROGRAM: 04/14/24: Standing wall slides into scaption: 10x Access Code: [email or web address] URL: [email or web address] Date: 04/25/2024 Prepared by: Raj Blanch  Exercises - Shoulder Flexion Wall Slide with Towel  - 1-2 x daily - 7 x weekly - 2 sets - 10 reps - Supine Shoulder  Press with Dowel  - 1-2 x daily - 7 x weekly - 2 sets - 10 reps - Supine Shoulder Flexion with Dowel  - 1-2 x daily - 7 x weekly - 2 sets - 10 reps  Access Code: [email or web address] URL: [email or web address] Date: 05/03/2024 Prepared by: Raj Blanch  Exercises - Seated Bilateral Shoulder Flexion Towel Slide at Table Top  - 1 x daily - 7 x weekly - 2 sets - 10 reps - Standing Shoulder Row with Anchored Resistance  - 1 x daily - 7 x weekly - 2 sets - 10 reps - Seated Scapular Retraction  - 1 x daily - 7 x weekly - 2 sets - 10 reps - Split Stance Single Arm Chest Press with Resistance  - 1 x daily - 7 x weekly - 2 sets - 10 reps - Standing Bent Over Single Arm Shoulder Row  - 1 x daily - 7 x weekly - 2 sets - 10 reps  ASSESSMENT:  CLINICAL IMPRESSION: Today's session focused on no manual therapy and exercises only. Pt educated on letting pain control her reps and sets for better pain management. Reviwed HEP to include exercises that caused least amount of pain during the session.   OBJECTIVE IMPAIRMENTS: decreased ROM, decreased strength, hypomobility, increased fascial restrictions, increased muscle spasms, impaired flexibility, impaired UE functional use, postural dysfunction, and pain.   ACTIVITY LIMITATIONS: carrying and sleeping  PARTICIPATION LIMITATIONS: meal prep, cleaning, laundry, driving, shopping, and community activity  PERSONAL FACTORS: Age and Time since onset of injury/illness/exacerbation are also affecting patient's functional outcome.   REHAB POTENTIAL: Good  CLINICAL DECISION MAKING: Stable/uncomplicated  EVALUATION COMPLEXITY: Moderate   GOALS: Goals reviewed with patient? Yes  SHORT TERM GOALS: No STG established due to POC <4 weeks   LONG TERM GOALS: Target date: 05/12/2024    Patient will be 100% compliant with HEP to self manage symptoms at home  Baseline: initiated 04/14/24 Goal status: INITIAL  2.  Patient will demo at least 10% improvement  on UEFS to improve overall function. Baseline: TBD Goal status: INITIAL  3.  Patient will demo grip strength in L UE to be >50 lbs to improve ability to hold and carry objects in L hand Baseline: 29 lbs (04/14/24) Goal status: INITIAL  4.  Patient will demo L shoulder AROM to be above 160 deg to improve ability to reach in overhead cabinets. Baseline: 120 deg Goal status: INITIAL  5.  Pt will report overall max pain of <5/10 in L shoulder and L arm  to improve function with L arm Baseline: 10/10 (04/14/24) Goal status: INITIAL   PLAN:  PT FREQUENCY: 2x/week  PT DURATION: 4 weeks  PLANNED INTERVENTIONS: [id number]- PT Re-evaluation, 97750- Physical Performance Testing, 97110-Therapeutic exercises, 97530- Therapeutic activity, 97112- Neuromuscular re-education, 97535- Self Care, [id number]- Manual therapy, [id number]- Electrical stimulation (unattended), Patient/Family education, Joint mobilization, Joint manipulation, Spinal manipulation, Spinal mobilization, DME instructions, Cryotherapy, and Moist heat  PLAN FOR NEXT SESSION: Adhesive capsulitis, progress HEP   Raj LOISE Blanch, PT 05/03/2024, 10:24 AM

## 2024-05-04 ENCOUNTER — Encounter: Payer: Self-pay | Admitting: Student in an Organized Health Care Education/Training Program

## 2024-05-04 ENCOUNTER — Ambulatory Visit: Admitting: Student in an Organized Health Care Education/Training Program

## 2024-05-04 VITALS — BP 110/84 | HR 73 | Temp 98.0°F | Ht 62.0 in | Wt 223.4 lb

## 2024-05-04 DIAGNOSIS — R053 Chronic cough: Secondary | ICD-10-CM

## 2024-05-04 DIAGNOSIS — J4540 Moderate persistent asthma, uncomplicated: Secondary | ICD-10-CM | POA: Diagnosis not present

## 2024-05-04 DIAGNOSIS — I2510 Atherosclerotic heart disease of native coronary artery without angina pectoris: Secondary | ICD-10-CM

## 2024-05-04 DIAGNOSIS — Z87891 Personal history of nicotine dependence: Secondary | ICD-10-CM | POA: Diagnosis not present

## 2024-05-04 DIAGNOSIS — I5032 Chronic diastolic (congestive) heart failure: Secondary | ICD-10-CM

## 2024-05-04 NOTE — Progress Notes (Signed)
 Assessment & Plan:   #Moderate Persistent Asthma #Chronic Cough  Cough is fully resolved, but she continues to have dyspnea. She has underlying significant coronary artery disease. PFT performed without bronchodilator challenge secondary to inability to perform the test. The flow volume loop shows signs of obstruction, and the increase in DLCO suggests air trapping and airway disease.   Given findings on PFT, and significant improvement in her symptoms with the initiation of ICS/LABA (advair), asthma and reactive airway disease is highest on my differential. Her previous chest CT showed normal lung parenchyma, but with mucus and airway thickening, as well as mosaicism, all of which suggest reactive airways. Allergen panel was negative, and no eosinophilia noted on review of historic CBC data.  -Continue Advair one puff twice daily -avoid aerosolized irritants -weight loss recommended  Return in about 6 months (around 11/04/2024).  I spent 32 minutes caring for this patient today, including preparing to see the patient, obtaining a medical history , reviewing a separately obtained history, performing a medically appropriate examination and/or evaluation, counseling and educating the patient/family/caregiver, ordering medications, tests, or procedures, documenting clinical information in the electronic health record, and independently interpreting results (not separately reported/billed) and communicating results to the patient/family/caregiver  Belva November, MD [facility]  End of visit medications:  No orders of the defined types were placed in this encounter.    Current Outpatient Medications:    ACCU-CHEK GUIDE TEST test strip, , Disp: , Rfl:    Accu-Chek Softclix Lancets lancets, 2 (two) times daily., Disp: , Rfl:    Acetaminophen  500 MG capsule, , Disp: , Rfl:    amLODipine  (NORVASC ) 10 MG tablet, Take 1 tablet (10 mg total) by mouth daily., Disp: 90 tablet,  Rfl: 3   aspirin  81 MG chewable tablet, Chew 1 tablet (81 mg total) by mouth in the morning., Disp: 90 tablet, Rfl: 3   atorvastatin  (LIPITOR ) 40 MG tablet, Take 1 tablet (40 mg total) by mouth every evening., Disp: 90 tablet, Rfl: 3   BINAXNOW COVID-19 AG HOME TEST KIT, , Disp: , Rfl:    Blood Glucose Monitoring Suppl (ACCU-CHEK GUIDE ME) w/Device KIT, See admin instructions., Disp: , Rfl:    Blood Pressure Monitoring (BLOOD PRESSURE CUFF) MISC, 1 each by Does not apply route daily., Disp: 1 each, Rfl: 0   Evolocumab  (REPATHA  SURECLICK) 140 MG/ML SOAJ, Inject 140 mg into the skin every 14 (fourteen) days., Disp: 2 mL, Rfl: 11   Ferrous Sulfate  (IRON ) 325 (65 FE) MG TABS, Take 325 mg by mouth. Twice a week, Disp: , Rfl:    fluticasone -salmeterol (ADVAIR DISKUS) 100-50 MCG/ACT AEPB, Inhale 1 puff into the lungs 2 (two) times daily., Disp: 60 each, Rfl: 12   furosemide  (LASIX ) 40 MG tablet, Take 1 tablet (40 mg total) by mouth as needed., Disp: 30 tablet, Rfl: 6   HYDROCHLOROTHIAZIDE  PO, hydroCHLOROthiazide , Disp: , Rfl:    isosorbide  mononitrate (IMDUR ) 60 MG 24 hr tablet, Take 1 tablet (60 mg total) by mouth daily., Disp: 90 tablet, Rfl: 2   JARDIANCE  10 MG TABS tablet, Take 1 tablet (10 mg total) by mouth daily., Disp: 30 tablet, Rfl: 6   losartan  (COZAAR ) 25 MG tablet, Take 1 tablet (25 mg total) by mouth daily., Disp: 90 tablet, Rfl: 1   metFORMIN (GLUCOPHAGE) 500 MG tablet, Take 500 mg by mouth 2 (two) times daily., Disp: , Rfl:    metoprolol  succinate (TOPROL -XL) 100 MG 24 hr tablet, Take 1 tablet (100 mg  total) by mouth daily. Take with or immediately following a meal., Disp: 90 tablet, Rfl: 1   nitroGLYCERIN  (NITROSTAT ) 0.4 MG SL tablet, PLACE 1 TABLET UNDER TONGUE EVERY 5 MINS AS NEEDED FOR CHEST PAIN, Disp: 25 tablet, Rfl: 3   OZEMPIC, 1 MG/DOSE, 4 MG/3ML SOPN, SMARTSIG:1 Milligram(s) Topical Once a Week, Disp: , Rfl:    pantoprazole  (PROTONIX ) 40 MG tablet, Take 1 tablet by mouth daily.,  Disp: , Rfl:    ranolazine  (RANEXA ) 1000 MG SR tablet, Take 1 tablet (1,000 mg total) by mouth 2 (two) times daily., Disp: 90 tablet, Rfl: 3   Semaglutide (OZEMPIC, 0.25 OR 0.5 MG/DOSE, [place]), Inject 0.5 mg into the skin every Sunday., Disp: , Rfl:    VITAMIN D, ERGOCALCIFEROL, PO, Vitamin D (Ergocalciferol), Disp: , Rfl:    Subjective:   PATIENT ID: [name] Rome GLORI: female DOB: 09/01/1966, MRN: [id number]  Chief Complaint  Patient presents with   Follow-up    Breathing is still short with exertion.    HPI  Patient is a pleasant 58 year old female presenting to clinic for follow up of cough and shortness of breath.  Initial Visit 03/27/2024: She reports symptom of 2 to 3 months in duration (March/April 2025). She developed a cough that was mostly dry and happened throughout the day.  She was seen by her outpatient providers and was told she had a rattling sound in her chest.  The cough is nonproductive and she denies any hemoptysis.  She is rarely able to bring up phlegm with this cough.  She has not had any fevers, chills, night sweats, chest pain, or chest tightness.  She does report an associated dyspnea that is mostly exertional.  She denies any pleurisy.  She feels that the cough is somewhat worse at night.  She also has bouts of cough in the morning after waking up.   Patient does not endorse any history of asthma nor any previous respiratory symptoms. She does report feeling irritated by smoke, scents, and perfumes.  She feels that she has to rotate through some of her perfumes because they would cause her irritation.  She has not had any personal history of asthma in the past nor has she had any episodes of recurrent bronchitis.  She has not used any inhalers previously.  Return Visit 05/04/2024: She feels much improved, and the cough is resolved. 10/10 improvement in symptoms since our last visit. She is avoiding irritants as discussed during our previous visit (sprays, scents,  etc...). She is compliant with her advair inhaler without any issues. She had her PFT's and is here to discuss results and next steps.   Past medical history: She has a history of CAD (multi-vessel disease), s/p multiple PCI's to RCA (last 10/2022). LHC from 07/2022 showed LVEDP of 9 mmHg. She was last seen by cardiology in February of 2025 for persistent symptoms of dyspnea. At that time, consideration was given to increasing medical therapy vs proceeding with PET/CT stress test and echocardiogram and possibly repeating heart catheterization. Decision was made to proceed with medical therapy.   Patient reports a distant history of smoking, having quit in 1995.  She smoked for 10 years with around 5 pack years of smoking history.  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  She denies any other occupational exposures.  Ancillary information including prior medications, full medical/surgical/family/social histories, and PFTs (when available) are listed below and have been reviewed.   Review of Systems  Constitutional:  Negative  for chills, fever and weight loss.  Respiratory:  Positive for shortness of breath. Negative for cough (resolved), hemoptysis, sputum production and wheezing.   Cardiovascular:  Negative for chest pain.     Objective:   Vitals:   05/04/24 1116  BP: 110/84  Pulse: 73  Temp: 98 F (36.7 C)  TempSrc: Oral  SpO2: 99%  Weight: 223 lb 6.4 oz (101.3 kg)  Height: 5' 2 (1.575 m)   99% on RA  BMI Readings from Last 3 Encounters:  05/04/24 40.86 kg/m  05/01/24 40.75 kg/m  04/10/24 40.97 kg/m   Wt Readings from Last 3 Encounters:  05/04/24 223 lb 6.4 oz (101.3 kg)  05/01/24 222 lb 12.8 oz (101.1 kg)  04/10/24 224 lb (101.6 kg)    Physical Exam Constitutional:      Appearance: She is obese. She is not ill-appearing.  Cardiovascular:     Rate and Rhythm: Normal rate and regular rhythm.     Pulses: Normal pulses.     Heart sounds: Normal heart  sounds.  Pulmonary:     Effort: Pulmonary effort is normal.     Breath sounds: Normal breath sounds.  Neurological:     General: No focal deficit present.     Mental Status: She is alert and oriented to person, place, and time. Mental status is at baseline.       Ancillary Information    Past Medical History:  Diagnosis Date   Anemia    CAD (coronary artery disease)    PCI with stent to RCA in 2019   CHF (congestive heart failure) (HCC) 04/2011   EF 15-20% from echo 05/19/11   DOE (dyspnea on exertion)    DVT (deep venous thrombosis) (HCC) 06/2011   LLE   Fatigue    HTN (hypertension)    Hyperlipidemia    Menorrhagia    with iron  deficient anemia   NSTEMI (non-ST elevated myocardial infarction) (HCC) 10/26/2017   NSTEMI (non-ST elevated myocardial infarction) (HCC) 10/27/2017   Obesity    Orthopnea    Type II diabetes mellitus (HCC)      Family History  Problem Relation Age of Onset   Hypertension Mother    Stroke Father    Heart attack Sister    Stroke Sister      Past Surgical History:  Procedure Laterality Date   CORONARY ANGIOPLASTY WITH STENT PLACEMENT  2013;  10/27/2017   CORONARY BALLOON ANGIOPLASTY N/A 05/26/2022   Procedure: CORONARY BALLOON ANGIOPLASTY;  Surgeon: Elmira Newman PARAS, MD;  Location: [facility];  Service: Cardiovascular;  Laterality: N/A;   CORONARY STENT INTERVENTION N/A 10/27/2017   Procedure: CORONARY STENT INTERVENTION;  Surgeon: Elmira Newman PARAS, MD;  Location: [facility];  Service: Cardiovascular;  Laterality: N/A;   CORONARY STENT INTERVENTION N/A 03/10/2022   Procedure: CORONARY STENT INTERVENTION;  Surgeon: Ladona Heinz, MD;  Location: [facility];  Service: Cardiovascular;  Laterality: N/A;   CORONARY STENT INTERVENTION N/A 03/11/2022   Procedure: CORONARY STENT INTERVENTION;  Surgeon: Elmira Newman PARAS, MD;  Location: [facility];  Service: Cardiovascular;  Laterality: N/A;  aborted   CORONARY  STENT INTERVENTION N/A 10/20/2022   Procedure: CORONARY STENT INTERVENTION;  Surgeon: Elmira Newman PARAS, MD;  Location: [facility];  Service: Cardiovascular;  Laterality: N/A;   CORONARY ULTRASOUND/IVUS N/A 05/26/2022   Procedure: Intravascular Ultrasound/IVUS;  Surgeon: Elmira Newman PARAS, MD;  Location: [facility];  Service: Cardiovascular;  Laterality: N/A;  CORONARY ULTRASOUND/IVUS N/A 10/20/2022   Procedure: Intravascular Ultrasound/IVUS;  Surgeon: Elmira Newman PARAS, MD;  Location: [facility];  Service: Cardiovascular;  Laterality: N/A;   LEFT HEART CATH AND CORONARY ANGIOGRAPHY N/A 10/27/2017   Procedure: LEFT HEART CATH AND CORONARY ANGIOGRAPHY;  Surgeon: Elmira Newman PARAS, MD;  Location: [facility];  Service: Cardiovascular;  Laterality: N/A;   LEFT HEART CATH AND CORONARY ANGIOGRAPHY N/A 11/17/2019   Procedure: LEFT HEART CATH AND CORONARY ANGIOGRAPHY;  Surgeon: Elmira Newman PARAS, MD;  Location: [facility];  Service: Cardiovascular;  Laterality: N/A;   LEFT HEART CATH AND CORONARY ANGIOGRAPHY N/A 03/10/2022   Procedure: LEFT HEART CATH AND CORONARY ANGIOGRAPHY;  Surgeon: Ladona Heinz, MD;  Location: [facility];  Service: Cardiovascular;  Laterality: N/A;   LEFT HEART CATH AND CORONARY ANGIOGRAPHY N/A 07/27/2022   Procedure: LEFT HEART CATH AND CORONARY ANGIOGRAPHY;  Surgeon: Ladona Heinz, MD;  [facility]: [facility];  Service: Cardiovascular;  Laterality: N/A;   LEFT HEART CATH AND CORONARY ANGIOGRAPHY N/A 10/20/2022   Procedure: LEFT HEART CATH AND CORONARY ANGIOGRAPHY;  Surgeon: Elmira Newman PARAS, MD;  Location: [facility];  Service: Cardiovascular;  Laterality: N/A;   LEFT HEART CATHETERIZATION WITH CORONARY ANGIOGRAM N/A 11/17/2011   Procedure: LEFT HEART CATHETERIZATION WITH CORONARY ANGIOGRAM;  Surgeon: Erick JONELLE Ladona, MD;  Location: [facility];  Service: Cardiovascular;  Laterality: N/A;   OVARIAN CYST REMOVAL      TUBAL LIGATION  2006   ULTRASOUND GUIDANCE FOR VASCULAR ACCESS  10/27/2017   Procedure: Ultrasound Guidance For Vascular Access;  Surgeon: Elmira Newman PARAS, MD;  Location: [facility];  Service: Cardiovascular;;    Social History   Socioeconomic History   Marital status: Widowed    Spouse name: Not on file   Number of children: 3   Years of education: 15   Highest education level: 11th grade  Occupational History   Occupation: unemployed  Tobacco Use   Smoking status: Former    Current packs/day: 0.00    Average packs/day: 0.5 packs/day for 10.0 years (5.0 ttl pk-yrs)    Types: Cigarettes    Start date: 10/20/1983    Quit date: 10/19/1993    Years since quitting: 30.5   Smokeless tobacco: Never  Vaping Use   Vaping status: Never Used  Substance and Sexual Activity   Alcohol use: No   Drug use: No   Sexual activity: Not Currently  Other Topics Concern   Not on file  Social History Narrative   Are you right handed or left handed? Right Handed   Are you currently employed ? No    What is your current occupation?   Do you live at home alone? No    Who lives with you? Children    What type of home do you live in: 1 story or 2 story? Two story home       Social Drivers of Corporate investment banker Strain: Not on file  Food Insecurity: No Food Insecurity (07/25/2022)   Hunger Vital Sign    Worried About Running Out of Food in the Last Year: Never true    Ran Out of Food in the Last Year: Never true  Transportation Needs: No Transportation Needs (07/25/2022)   PRAPARE - Administrator, Civil Service (Medical): No    Lack of Transportation (Non-Medical): No  Physical Activity: Not on file  Stress: Not on file  Social Connections: Not on file  Intimate Partner Violence: Not At Risk (07/25/2022)   Humiliation, Afraid, Rape, and Kick questionnaire    Fear of Current or Ex-Partner: No    Emotionally Abused: No    Physically Abused: No    Sexually Abused:  No     Allergies  Allergen Reactions   Tolterodine Tartrate Dermatitis     CBC    Component Value Date/Time   WBC 4.5 04/24/2024 0719   RBC 4.62 04/24/2024 0719   HGB 11.3 (L) 04/24/2024 0719   HGB 11.8 12/08/2023 0957   HCT 35.0 (L) 04/24/2024 0719   HCT 37.5 12/08/2023 0957   PLT 270 04/24/2024 0719   PLT 336 12/08/2023 0957   MCV 75.8 (L) 04/24/2024 0719   MCV 78 (L) 12/08/2023 0957   MCH 24.5 (L) 04/24/2024 0719   MCHC 32.3 04/24/2024 0719   RDW 14.3 04/24/2024 0719   RDW 13.1 12/08/2023 0957   LYMPHSABS 1.0 04/24/2024 0719   MONOABS 0.4 04/24/2024 0719   EOSABS 0.1 04/24/2024 0719   BASOSABS 0.0 04/24/2024 0719    Pulmonary Functions Testing Results:    Latest Ref Rng & Units 05/01/2024   10:39 AM  PFT Results  FVC-Pre L 2.48   FVC-Predicted Pre % 78   Pre FEV1/FVC % % 82   FEV1-Pre L 2.04   FEV1-Predicted Pre % 82   DLCO uncorrected ml/min/mmHg 20.17   DLCO UNC% % 105   DLCO corrected ml/min/mmHg 21.71   DLCO COR %Predicted % 113   DLVA Predicted % 142   TLC L 3.63   TLC % Predicted % 76   RV % Predicted % 70     Outpatient Medications Prior to Visit  Medication Sig Dispense Refill   ACCU-CHEK GUIDE TEST test strip      Accu-Chek Softclix Lancets lancets 2 (two) times daily.     Acetaminophen  500 MG capsule      amLODipine  (NORVASC ) 10 MG tablet Take 1 tablet (10 mg total) by mouth daily. 90 tablet 3   aspirin  81 MG chewable tablet Chew 1 tablet (81 mg total) by mouth in the morning. 90 tablet 3   atorvastatin  (LIPITOR ) 40 MG tablet Take 1 tablet (40 mg total) by mouth every evening. 90 tablet 3   BINAXNOW COVID-19 AG HOME TEST KIT      Blood Glucose Monitoring Suppl (ACCU-CHEK GUIDE ME) w/Device KIT See admin instructions.     Blood Pressure Monitoring (BLOOD PRESSURE CUFF) MISC 1 each by Does not apply route daily. 1 each 0   Evolocumab  (REPATHA  SURECLICK) 140 MG/ML SOAJ Inject 140 mg into the skin every 14 (fourteen) days. 2 mL 11   Ferrous  Sulfate (IRON ) 325 (65 FE) MG TABS Take 325 mg by mouth. Twice a week     fluticasone -salmeterol (ADVAIR DISKUS) 100-50 MCG/ACT AEPB Inhale 1 puff into the lungs 2 (two) times daily. 60 each 12   furosemide  (LASIX ) 40 MG tablet Take 1 tablet (40 mg total) by mouth as needed. 30 tablet 6   HYDROCHLOROTHIAZIDE  PO hydroCHLOROthiazide      isosorbide  mononitrate (IMDUR ) 60 MG 24 hr tablet Take 1 tablet (60 mg total) by mouth daily. 90 tablet 2   JARDIANCE  10 MG TABS tablet Take 1 tablet (10 mg total) by mouth daily. 30 tablet 6   losartan  (COZAAR ) 25 MG tablet Take 1 tablet (25 mg total) by mouth daily. 90 tablet 1   metFORMIN (GLUCOPHAGE) 500 MG tablet Take 500 mg by  mouth 2 (two) times daily.     metoprolol  succinate (TOPROL -XL) 100 MG 24 hr tablet Take 1 tablet (100 mg total) by mouth daily. Take with or immediately following a meal. 90 tablet 1   nitroGLYCERIN  (NITROSTAT ) 0.4 MG SL tablet PLACE 1 TABLET UNDER TONGUE EVERY 5 MINS AS NEEDED FOR CHEST PAIN 25 tablet 3   OZEMPIC, 1 MG/DOSE, 4 MG/3ML SOPN SMARTSIG:1 Milligram(s) Topical Once a Week     pantoprazole  (PROTONIX ) 40 MG tablet Take 1 tablet by mouth daily.     ranolazine  (RANEXA ) 1000 MG SR tablet Take 1 tablet (1,000 mg total) by mouth 2 (two) times daily. 90 tablet 3   Semaglutide (OZEMPIC, 0.25 OR 0.5 MG/DOSE, [place]) Inject 0.5 mg into the skin every Sunday.     VITAMIN D, ERGOCALCIFEROL, PO Vitamin D (Ergocalciferol)     No facility-administered medications prior to visit.

## 2024-05-05 ENCOUNTER — Ambulatory Visit

## 2024-05-05 DIAGNOSIS — M79602 Pain in left arm: Secondary | ICD-10-CM

## 2024-05-05 DIAGNOSIS — M25512 Pain in left shoulder: Secondary | ICD-10-CM

## 2024-05-05 DIAGNOSIS — M6281 Muscle weakness (generalized): Secondary | ICD-10-CM

## 2024-05-05 DIAGNOSIS — M542 Cervicalgia: Secondary | ICD-10-CM

## 2024-05-05 NOTE — Therapy (Signed)
 OUTPATIENT PHYSICAL THERAPY TREATMENT NOTE   Patient Name: [name] MRN: [id number] DOB:August 16, 1966, 58 y.o., female Today's Date: 05/05/2024  END OF SESSION:  PT End of Session - 05/05/24 1157     Visit Number 5    Number of Visits 9    Date for PT Re-Evaluation 05/12/24    Authorization Type [employer]    PT Start Time 1150    PT Stop Time 1230    PT Time Calculation (min) 40 min    Activity Tolerance Patient tolerated treatment well    Behavior During Therapy [facility] for tasks assessed/performed             Past Medical History:  Diagnosis Date   Anemia    CAD (coronary artery disease)    PCI with stent to RCA in 2019   CHF (congestive heart failure) (HCC) 04/2011   EF 15-20% from echo 05/19/11   DOE (dyspnea on exertion)    DVT (deep venous thrombosis) (HCC) 06/2011   LLE   Fatigue    HTN (hypertension)    Hyperlipidemia    Menorrhagia    with iron  deficient anemia   NSTEMI (non-ST elevated myocardial infarction) (HCC) 10/26/2017   NSTEMI (non-ST elevated myocardial infarction) (HCC) 10/27/2017   Obesity    Orthopnea    Type II diabetes mellitus (HCC)    Past Surgical History:  Procedure Laterality Date   CORONARY ANGIOPLASTY WITH STENT PLACEMENT  2013;  10/27/2017   CORONARY BALLOON ANGIOPLASTY N/A 05/26/2022   Procedure: CORONARY BALLOON ANGIOPLASTY;  Surgeon: Elmira Newman PARAS, MD;  Location: [facility];  Service: Cardiovascular;  Laterality: N/A;   CORONARY STENT INTERVENTION N/A 10/27/2017   Procedure: CORONARY STENT INTERVENTION;  Surgeon: Elmira Newman PARAS, MD;  Location: [facility];  Service: Cardiovascular;  Laterality: N/A;   CORONARY STENT INTERVENTION N/A 03/10/2022   Procedure: CORONARY STENT INTERVENTION;  Surgeon: Ladona Heinz, MD;  Location: [facility];  Service: Cardiovascular;  Laterality: N/A;   CORONARY STENT INTERVENTION N/A 03/11/2022   Procedure: CORONARY STENT INTERVENTION;  Surgeon: Elmira Newman PARAS,  MD;  Location: [facility];  Service: Cardiovascular;  Laterality: N/A;  aborted   CORONARY STENT INTERVENTION N/A 10/20/2022   Procedure: CORONARY STENT INTERVENTION;  Surgeon: Elmira Newman PARAS, MD;  [facility]: [facility];  Service: Cardiovascular;  Laterality: N/A;   CORONARY ULTRASOUND/IVUS N/A 05/26/2022   Procedure: Intravascular Ultrasound/IVUS;  Surgeon: Elmira Newman PARAS, MD;  [facility]: [facility];  Service: Cardiovascular;  Laterality: N/A;   CORONARY ULTRASOUND/IVUS N/A 10/20/2022   Procedure: Intravascular Ultrasound/IVUS;  Surgeon: Elmira Newman PARAS, MD;  [facility]: [facility];  Service: Cardiovascular;  Laterality: N/A;   LEFT HEART CATH AND CORONARY ANGIOGRAPHY N/A 10/27/2017   Procedure: LEFT HEART CATH AND CORONARY ANGIOGRAPHY;  Surgeon: Elmira Newman PARAS, MD;  [facility]: [facility];  Service: Cardiovascular;  Laterality: N/A;   LEFT HEART CATH AND CORONARY ANGIOGRAPHY N/A 11/17/2019   Procedure: LEFT HEART CATH AND CORONARY ANGIOGRAPHY;  Surgeon: Elmira Newman PARAS, MD;  [facility]: [facility];  Service: Cardiovascular;  Laterality: N/A;   LEFT HEART CATH AND CORONARY ANGIOGRAPHY N/A 03/10/2022   Procedure: LEFT HEART CATH AND CORONARY ANGIOGRAPHY;  Surgeon: Ladona Heinz, MD;  [facility];  Service: Cardiovascular;  Laterality: N/A;   LEFT HEART CATH AND CORONARY ANGIOGRAPHY N/A 07/27/2022   Procedure: LEFT HEART CATH AND CORONARY ANGIOGRAPHY;  Surgeon: Ladona Heinz,  MD;  [facility];  Service: Cardiovascular;  Laterality: N/A;   LEFT HEART CATH AND CORONARY ANGIOGRAPHY N/A 10/20/2022   Procedure: LEFT HEART CATH AND CORONARY ANGIOGRAPHY;  Surgeon: Elmira Newman PARAS, MD;  [facility];  Service: Cardiovascular;  Laterality: N/A;   LEFT HEART CATHETERIZATION WITH CORONARY ANGIOGRAM N/A 11/17/2011   Procedure: LEFT HEART CATHETERIZATION WITH CORONARY ANGIOGRAM;  Surgeon: Erick JONELLE Bergamo, MD;   Location: [facility] C[facility];  Service: Cardiovascular;  Laterality: N/A;   OVARIAN CYST REMOVAL     TUBAL LIGATION  2006   ULTRASOUND GUIDANCE FOR VASCULAR ACCESS  10/27/2017   Procedure: Ultrasound Guidance For Vascular Access;  Surgeon: Elmira Newman PARAS, MD;  Location: [facility];  Service: Cardiovascular;;   Patient Active Problem List   Diagnosis Date Noted   Coronary artery disease involving native coronary artery of native heart without angina pectoris    Pleuritic pain 07/25/2022   History of non-ST elevation myocardial infarction (NSTEMI)    History of coronary angioplasty with insertion of stent    Mixed hyperlipidemia    Post PTCA 05/26/2022   Chronic iron  deficiency anemia 03/10/2022   Chronic diastolic CHF (congestive heart failure) (HCC) 03/10/2022   Non-insulin  dependent type 2 diabetes mellitus (HCC) 03/10/2022   Morbidly obese (HCC) 08/19/2020   Uncontrolled type 2 diabetes mellitus with hyperglycemia (HCC) 08/19/2020   Unstable angina (HCC) 11/16/2019   Non-ST elevation (NSTEMI) myocardial infarction (HCC) 10/27/2017   OSA (obstructive sleep apnea) 02/21/2013   Atherosclerosis of native coronary artery of native heart with stable angina pectoris (HCC) 11/18/2011   Dyslipidemia 11/18/2011   Precordial pain 11/17/2011   Abnormal cardiovascular stress test 11/17/2011   DVT (deep venous thrombosis) (HCC) 11/01/2011   Essential hypertension 11/01/2011   Blood loss anemia 11/01/2011   Exertional dyspnea 08/19/2011    PCP: Schuyler Lesser, PA-C  REFERRING PROVIDER: Tobie Tonita POUR, DO  REFERRING DIAG: R20.0 (ICD-10-CM) - Left arm numbness  THERAPY DIAG:  Pain in left arm  Muscle weakness (generalized)  Acute pain of left shoulder  Neck pain  Rationale for Evaluation and Treatment: Rehabilitation  ONSET DATE: 04/10/2024- date of referral  SUBJECTIVE:                                                                                                                                                                                                          SUBJECTIVE STATEMENT: Pt reports pain is better. Sleeping throught the night okay. Hand dominance: Right  PERTINENT HISTORY:  CHF, CAD, DM  PAIN:  Are you having pain? Yes: NPRS scale: 7/10 Pain location: L side of neck, L arm Pain description: radiating, heavy, tingling Aggravating factors: unsure Relieving factors: unsure  PRECAUTIONS: None  RED FLAGS: None     WEIGHT BEARING RESTRICTIONS: No  FALLS:  Has patient fallen in last 6 months? No  PLOF: Independent  PATIENT GOALS: improve pain    OBJECTIVE:  Note: Objective measures were completed at Evaluation unless otherwise noted.  DIAGNOSTIC FINDINGS:  MRI of Brain: 02/17/24   IMPRESSION: 1. No acute intracranial abnormality. 2. Moderate cerebral white matter disease, nonspecific, but most likely related chronic microvascular ischemic disease. Probable tiny remote left cerebellar infarct.  PATIENT SURVEYS:   COGNITION: Overall cognitive status: Within functional limits for tasks assessed  PALPATION: GH capsule hypomobility grossly   CERVICAL ROM:   Active ROM A/PROM (deg) eval  Flexion 75  Extension 70  Right lateral flexion   Left lateral flexion   Right rotation 75  Left rotation 80   (Blank rows = not tested)  UPPER EXTREMITY ROM:  Active/Passive ROM Right eval Left eval  Shoulder flexion 180/180 120/120  Shoulder extension 180/180 90/100  Shoulder abduction    Shoulder adduction    Shoulder extension    Shoulder internal rotation T6 T12  Shoulder external rotation T2 L occiput  Elbow flexion    Elbow extension    Wrist flexion    Wrist extension    Wrist ulnar deviation    Wrist radial deviation    Wrist pronation    Wrist supination     (Blank rows = not tested)  UPPER EXTREMITY MMT:  MMT Right eval Left eval  Shoulder flexion 5/5 3+/5  Shoulder extension    Shoulder abduction  5/5 3+/5  Shoulder adduction    Shoulder extension    Shoulder internal rotation 5/5 3+/5  Shoulder external rotation 5/5 3+/5  Middle trapezius    Lower trapezius    Elbow flexion 5/5 3+/5  Elbow extension 5/5 3+/5  Wrist flexion    Wrist extension    Wrist ulnar deviation    Wrist radial deviation    Wrist pronation    Wrist supination    Grip strength 69 25   (Blank rows = not tested)   TREATMENT DATE:                                                                                                                               Seated scap squeezes: 30x Seated ball roll outs: 10x center fwd, 10x to R and 10x to L Clothes pin on vertical rod: blue, green, red, yellow: 5 each, starting from top of the bar and working down using only L UE; then taking them all off and putting them down. Positioned about 20-25 bean bags to R and L of patient on mat table. Pt to reach for bean bags and toss them OH into basket in front Rows: red thera  band: 3 x 10 Supine chest press with 2 lb therabar: 3 x 8 bil Supine 45 deg abduction shoulder IR and ER in painfree range: 3 x 10 L only Standing rows with yellow sport cord: 3 x 10 Seated unilateral chest press with yellow sport cord:4 x 5 R and L        PATIENT EDUCATION:  Education details: see above Person educated: Patient Education method: Explanation Education comprehension: verbalized understanding  HOME EXERCISE PROGRAM: 04/14/24: Standing wall slides into scaption: 10x Access Code: [email or web address] URL: [email or web address] Date: 04/25/2024 Prepared by: Raj Blanch  Exercises - Shoulder Flexion Wall Slide with Towel  - 1-2 x daily - 7 x weekly - 2 sets - 10 reps - Supine Shoulder Press with Dowel  - 1-2 x daily - 7 x weekly - 2 sets - 10 reps - Supine Shoulder Flexion with Dowel  - 1-2 x daily - 7 x weekly - 2 sets - 10 reps  Access Code: [email or web address] URL: [email or web address] Date: 05/03/2024 Prepared  by: Raj Blanch  Exercises - Seated Bilateral Shoulder Flexion Towel Slide at Table Top  - 1 x daily - 7 x weekly - 2 sets - 10 reps - Standing Shoulder Row with Anchored Resistance  - 1 x daily - 7 x weekly - 2 sets - 10 reps - Seated Scapular Retraction  - 1 x daily - 7 x weekly - 2 sets - 10 reps - Split Stance Single Arm Chest Press with Resistance  - 1 x daily - 7 x weekly - 2 sets - 10 reps - Standing Bent Over Single Arm Shoulder Row  - 1 x daily - 7 x weekly - 2 sets - 10 reps  ASSESSMENT:  CLINICAL IMPRESSION: Fatigues very quickly with exercises with L UE due to lack of strength and endurance. Pt is reporting improving overall pain with more exercise based approach.  OBJECTIVE IMPAIRMENTS: decreased ROM, decreased strength, hypomobility, increased fascial restrictions, increased muscle spasms, impaired flexibility, impaired UE functional use, postural dysfunction, and pain.   ACTIVITY LIMITATIONS: carrying and sleeping  PARTICIPATION LIMITATIONS: meal prep, cleaning, laundry, driving, shopping, and community activity  PERSONAL FACTORS: Age and Time since onset of injury/illness/exacerbation are also affecting patient's functional outcome.   REHAB POTENTIAL: Good  CLINICAL DECISION MAKING: Stable/uncomplicated  EVALUATION COMPLEXITY: Moderate   GOALS: Goals reviewed with patient? Yes  SHORT TERM GOALS: No STG established due to POC <4 weeks   LONG TERM GOALS: Target date: 05/12/2024    Patient will be 100% compliant with HEP to self manage symptoms at home  Baseline: initiated 04/14/24 Goal status: INITIAL  2.  Patient will demo at least 10% improvement on UEFS to improve overall function. Baseline: TBD Goal status: INITIAL  3.  Patient will demo grip strength in L UE to be >50 lbs to improve ability to hold and carry objects in L hand Baseline: 29 lbs (04/14/24) Goal status: INITIAL  4.  Patient will demo L shoulder AROM to be above 160 deg to improve  ability to reach in overhead cabinets. Baseline: 120 deg Goal status: INITIAL  5.  Pt will report overall max pain of <5/10 in L shoulder and L arm to improve function with L arm Baseline: 10/10 (04/14/24) Goal status: INITIAL   PLAN:  PT FREQUENCY: 2x/week  PT DURATION: 4 weeks  PLANNED INTERVENTIONS: [id number]- PT Re-evaluation, [id number]- Physical Performance Testing, 97110-Therapeutic exercises, 97530- Therapeutic activity, [id number]- Neuromuscular re-education, 97535- Self Care, [id number]- Manual therapy, [id number]- Electrical stimulation (unattended),  Patient/Family education, Joint mobilization, Joint manipulation, Spinal manipulation, Spinal mobilization, DME instructions, Cryotherapy, and Moist heat  PLAN FOR NEXT SESSION: Adhesive capsulitis, progress HEP   Raj LOISE Blanch, PT 05/05/2024, 11:57 AM

## 2024-05-08 ENCOUNTER — Ambulatory Visit

## 2024-05-08 DIAGNOSIS — M542 Cervicalgia: Secondary | ICD-10-CM

## 2024-05-08 DIAGNOSIS — M79602 Pain in left arm: Secondary | ICD-10-CM

## 2024-05-08 DIAGNOSIS — M6281 Muscle weakness (generalized): Secondary | ICD-10-CM

## 2024-05-08 DIAGNOSIS — M25512 Pain in left shoulder: Secondary | ICD-10-CM

## 2024-05-08 NOTE — Therapy (Addendum)
 OUTPATIENT PHYSICAL THERAPY TREATMENT NOTE   Patient Name: [name] MRN: [id number] DOB:[birth year], 58 y.o., female Today's Date: 05/08/2024  END OF SESSION:  PT End of Session - 05/08/24 1152     Visit Number 6    Number of Visits 9    Date for PT Re-Evaluation 05/12/24    Authorization Type [employer] [place] medicaid  $4 copay  Approved 11 PT visits 04/19/2024 - 07/17/2024    PT Start Time 1150    PT Stop Time 1250    PT Time Calculation (min) 60 min    Activity Tolerance Patient tolerated treatment well    Behavior During Therapy [facility] for tasks assessed/performed              Past Medical History:  Diagnosis Date   Anemia    CAD (coronary artery disease)    PCI with stent to RCA in 2019   CHF (congestive heart failure) (HCC) 04/2011   EF 15-20% from echo 05/19/11   DOE (dyspnea on exertion)    DVT (deep venous thrombosis) (HCC) 06/2011   LLE   Fatigue    HTN (hypertension)    Hyperlipidemia    Menorrhagia    with iron  deficient anemia   NSTEMI (non-ST elevated myocardial infarction) (HCC) 10/26/2017   NSTEMI (non-ST elevated myocardial infarction) (HCC) 10/27/2017   Obesity    Orthopnea    Type II diabetes mellitus (HCC)    Past Surgical History:  Procedure Laterality Date   CORONARY ANGIOPLASTY WITH STENT PLACEMENT  2013;  10/27/2017   CORONARY BALLOON ANGIOPLASTY N/A 05/26/2022   Procedure: CORONARY BALLOON ANGIOPLASTY;  Surgeon: Elmira Newman PARAS, MD;  Location: [facility];  Service: Cardiovascular;  Laterality: N/A;   CORONARY STENT INTERVENTION N/A 10/27/2017   Procedure: CORONARY STENT INTERVENTION;  Surgeon: Elmira Newman PARAS, MD;  Location: [facility];  Service: Cardiovascular;  Laterality: N/A;   CORONARY STENT INTERVENTION N/A 03/10/2022   Procedure: CORONARY STENT INTERVENTION;  Surgeon: Ladona Heinz, MD;  Location: [facility];  Service: Cardiovascular;  Laterality: N/A;   CORONARY STENT INTERVENTION N/A 03/11/2022    Procedure: CORONARY STENT INTERVENTION;  Surgeon: Elmira Newman PARAS, MD;  Location: [facility];  Service: Cardiovascular;  Laterality: N/A;  aborted   CORONARY STENT INTERVENTION N/A 10/20/2022   Procedure: CORONARY STENT INTERVENTION;  Surgeon: Elmira Newman PARAS, MD;  Location: [facility];  Service: Cardiovascular;  Laterality: N/A;   CORONARY ULTRASOUND/IVUS N/A 05/26/2022   Procedure: Intravascular Ultrasound/IVUS;  Surgeon: Elmira Newman PARAS, MD;  [facility]: [facility];  Service: Cardiovascular;  Laterality: N/A;   CORONARY ULTRASOUND/IVUS N/A 10/20/2022   Procedure: Intravascular Ultrasound/IVUS;  Surgeon: Elmira Newman PARAS, MD;  [facility]: [facility];  Service: Cardiovascular;  Laterality: N/A;   LEFT HEART CATH AND CORONARY ANGIOGRAPHY N/A 10/27/2017   Procedure: LEFT HEART CATH AND CORONARY ANGIOGRAPHY;  Surgeon: Elmira Newman PARAS, MD;  [facility]: [facility];  Service: Cardiovascular;  Laterality: N/A;   LEFT HEART CATH AND CORONARY ANGIOGRAPHY N/A 11/17/2019   Procedure: LEFT HEART CATH AND CORONARY ANGIOGRAPHY;  Surgeon: Elmira Newman PARAS, MD;  [facility]: [facility];  Service: Cardiovascular;  Laterality: N/A;   LEFT HEART CATH AND CORONARY ANGIOGRAPHY N/A 03/10/2022   Procedure: LEFT HEART CATH AND CORONARY ANGIOGRAPHY;  Surgeon: Ladona Heinz, MD;  [facility]: [facility];  Service: Cardiovascular;  Laterality: N/A;   LEFT HEART CATH AND CORONARY ANGIOGRAPHY N/A 07/27/2022  Procedure: LEFT HEART CATH AND CORONARY ANGIOGRAPHY;  Surgeon: Ladona Heinz, MD;  [facility];  Service: Cardiovascular;  Laterality: N/A;   LEFT HEART CATH AND CORONARY ANGIOGRAPHY N/A 10/20/2022   Procedure: LEFT HEART CATH AND CORONARY ANGIOGRAPHY;  Surgeon: Elmira Newman PARAS, MD;  [facility];  Service: Cardiovascular;  Laterality: N/A;   LEFT HEART CATHETERIZATION WITH CORONARY ANGIOGRAM N/A 11/17/2011   Procedure: LEFT HEART  CATHETERIZATION WITH CORONARY ANGIOGRAM;  Surgeon: Erick JONELLE Ladona, MD;  Location: [facility] CATH LAB;  Service: Cardiovascular;  Laterality: N/A;   OVARIAN CYST REMOVAL     TUBAL LIGATION  2006   ULTRASOUND GUIDANCE FOR VASCULAR ACCESS  10/27/2017   Procedure: Ultrasound Guidance For Vascular Access;  Surgeon: Elmira Newman PARAS, MD;  Location: [facility];  Service: Cardiovascular;;   Patient Active Problem List   Diagnosis Date Noted   Coronary artery disease involving native coronary artery of native heart without angina pectoris    Pleuritic pain 07/25/2022   History of non-ST elevation myocardial infarction (NSTEMI)    History of coronary angioplasty with insertion of stent    Mixed hyperlipidemia    Post PTCA 05/26/2022   Chronic iron  deficiency anemia 03/10/2022   Chronic diastolic CHF (congestive heart failure) (HCC) 03/10/2022   Non-insulin  dependent type 2 diabetes mellitus (HCC) 03/10/2022   Morbidly obese (HCC) 08/19/2020   Uncontrolled type 2 diabetes mellitus with hyperglycemia (HCC) 08/19/2020   Unstable angina (HCC) 11/16/2019   Non-ST elevation (NSTEMI) myocardial infarction (HCC) 10/27/2017   OSA (obstructive sleep apnea) 02/21/2013   Atherosclerosis of native coronary artery of native heart with stable angina pectoris (HCC) 11/18/2011   Dyslipidemia 11/18/2011   Precordial pain 11/17/2011   Abnormal cardiovascular stress test 11/17/2011   DVT (deep venous thrombosis) (HCC) 11/01/2011   Essential hypertension 11/01/2011   Blood loss anemia 11/01/2011   Exertional dyspnea 08/19/2011    PCP: Schuyler Lesser, PA-C  REFERRING PROVIDER: Tobie Tonita POUR, DO  REFERRING DIAG: R20.0 (ICD-10-CM) - Left arm numbness  THERAPY DIAG:  Pain in left arm  Muscle weakness (generalized)  Acute pain of left shoulder  Neck pain  Rationale for Evaluation and Treatment: Rehabilitation  ONSET DATE: 04/10/2024- date of referral  SUBJECTIVE:                                                                                                                                                                                                          SUBJECTIVE STATEMENT: Pt reports she was in lot of pain after  last time. ON Saturday, she tried to sweep the floors and that was the worst thing she could do. Hand dominance: Right  PERTINENT HISTORY:  CHF, CAD, DM  PAIN:  Are you having pain? Yes: NPRS scale: 6/10 Pain location: L side of neck, L arm Pain description: radiating, heavy, tingling Aggravating factors: unsure Relieving factors: unsure  PRECAUTIONS: None  RED FLAGS: None     WEIGHT BEARING RESTRICTIONS: No  FALLS:  Has patient fallen in last 6 months? No  PLOF: Independent  PATIENT GOALS: improve pain    OBJECTIVE:  Note: Objective measures were completed at Evaluation unless otherwise noted.  DIAGNOSTIC FINDINGS:  MRI of Brain: 02/17/24   IMPRESSION: 1. No acute intracranial abnormality. 2. Moderate cerebral white matter disease, nonspecific, but most likely related chronic microvascular ischemic disease. Probable tiny remote left cerebellar infarct.  PATIENT SURVEYS:   COGNITION: Overall cognitive status: Within functional limits for tasks assessed  PALPATION: GH capsule hypomobility grossly   CERVICAL ROM:   Active ROM A/PROM (deg) eval  Flexion 75  Extension 70  Right lateral flexion   Left lateral flexion   Right rotation 75  Left rotation 80   (Blank rows = not tested)  UPPER EXTREMITY ROM:  Active/Passive ROM Right eval Left eval  Shoulder flexion 180/180 120/120  Shoulder extension 180/180 90/100  Shoulder abduction    Shoulder adduction    Shoulder extension    Shoulder internal rotation T6 T12  Shoulder external rotation T2 L occiput  Elbow flexion    Elbow extension    Wrist flexion    Wrist extension    Wrist ulnar deviation    Wrist radial deviation    Wrist pronation    Wrist supination      (Blank rows = not tested)  UPPER EXTREMITY MMT:  MMT Right eval Left eval  Shoulder flexion 5/5 3+/5  Shoulder extension    Shoulder abduction 5/5 3+/5  Shoulder adduction    Shoulder extension    Shoulder internal rotation 5/5 3+/5  Shoulder external rotation 5/5 3+/5  Middle trapezius    Lower trapezius    Elbow flexion 5/5 3+/5  Elbow extension 5/5 3+/5  Wrist flexion    Wrist extension    Wrist ulnar deviation    Wrist radial deviation    Wrist pronation    Wrist supination    Grip strength 69 25   (Blank rows = not tested)   TREATMENT DATE:   05/08/24 Pain education: pt educated on being conscious of aches/pains while performing exercises and never performing ROM or exercises beyond very mild discomfort to avoid excessive soreness after exercises due to hypersensitivity of her nerves. Table slides into flexion: 3 x 10 Shoulder ER and IR table slides : 2 x 10 Seated scap squeezes: 30x Clothes pin on vertical rod: blue, green, red, yellow: 5 each, starting from top of the bar and working down using only L UE; then taking them all off and putting them down. Rolling pin bil UE: 30x Juxerciser: 6x  E-stim: interferential, 10.5 volts, 4 pads applied around shoulder/neck region on L shoulder for 20' with hot pack in neck and on shoulder. (E-stim precautions/contraindications reviewed prior to application)       PATIENT EDUCATION:  Education details: see above Person educated: Patient Education method: Explanation Education comprehension: verbalized understanding  HOME EXERCISE PROGRAM: 04/14/24: Standing wall slides into scaption: 10x Access Code: [email or web address] URL: [email or web address] Date: 04/25/2024 Prepared by: Raj Blanch  Exercises - Shoulder Flexion  Wall Slide with Towel  - 1-2 x daily - 7 x weekly - 2 sets - 10 reps - Supine Shoulder Press with Dowel  - 1-2 x daily - 7 x weekly - 2 sets - 10 reps - Supine Shoulder Flexion with Dowel  -  1-2 x daily - 7 x weekly - 2 sets - 10 reps  Access Code: [email or web address] URL: [email or web address] Date: 05/03/2024 Prepared by: Raj Blanch  Exercises - Seated Bilateral Shoulder Flexion Towel Slide at Table Top  - 1 x daily - 7 x weekly - 2 sets - 10 reps - Standing Shoulder Row with Anchored Resistance  - 1 x daily - 7 x weekly - 2 sets - 10 reps - Seated Scapular Retraction  - 1 x daily - 7 x weekly - 2 sets - 10 reps - Split Stance Single Arm Chest Press with Resistance  - 1 x daily - 7 x weekly - 2 sets - 10 reps - Standing Bent Over Single Arm Shoulder Row  - 1 x daily - 7 x weekly - 2 sets - 10 reps  ASSESSMENT:  CLINICAL IMPRESSION: Patient tolerated session well. Did not reports increase in soreness after exercises. Session emphasized on performing exercises in pain free range for improved pain management. Performed E-stim today for improved pain management. Pt reports fatigue and pain with exercises and needs moderation training for better pain management. Reported improved pain with E-stim.   OBJECTIVE IMPAIRMENTS: decreased ROM, decreased strength, hypomobility, increased fascial restrictions, increased muscle spasms, impaired flexibility, impaired UE functional use, postural dysfunction, and pain.   ACTIVITY LIMITATIONS: carrying and sleeping  PARTICIPATION LIMITATIONS: meal prep, cleaning, laundry, driving, shopping, and community activity  PERSONAL FACTORS: Age and Time since onset of injury/illness/exacerbation are also affecting patient's functional outcome.   REHAB POTENTIAL: Good  CLINICAL DECISION MAKING: Stable/uncomplicated  EVALUATION COMPLEXITY: Moderate   GOALS: Goals reviewed with patient? Yes  SHORT TERM GOALS: No STG established due to POC <4 weeks   LONG TERM GOALS: Target date: 05/12/2024    Patient will be 100% compliant with HEP to self manage symptoms at home  Baseline: initiated 04/14/24 Goal status: INITIAL  2.  Patient will  demo at least 10% improvement on UEFS to improve overall function. Baseline: TBD Goal status: INITIAL  3.  Patient will demo grip strength in L UE to be >50 lbs to improve ability to hold and carry objects in L hand Baseline: 29 lbs (04/14/24) Goal status: INITIAL  4.  Patient will demo L shoulder AROM to be above 160 deg to improve ability to reach in overhead cabinets. Baseline: 120 deg Goal status: INITIAL  5.  Pt will report overall max pain of <5/10 in L shoulder and L arm to improve function with L arm Baseline: 10/10 (04/14/24) Goal status: INITIAL   PLAN:  PT FREQUENCY: 2x/week  PT DURATION: 4 weeks  PLANNED INTERVENTIONS: [id number]- PT Re-evaluation, 97750- Physical Performance Testing, 97110-Therapeutic exercises, 97530- Therapeutic activity, 97112- Neuromuscular re-education, 97535- Self Care, [id number]- Manual therapy, [id number]- Electrical stimulation (unattended), Patient/Family education, Joint mobilization, Joint manipulation, Spinal manipulation, Spinal mobilization, DME instructions, Cryotherapy, and Moist heat  PLAN FOR NEXT SESSION: Adhesive capsulitis, progress HEP   Raj LOISE Blanch, PT 05/08/2024, 12:41 PM

## 2024-05-12 ENCOUNTER — Ambulatory Visit

## 2024-05-15 ENCOUNTER — Ambulatory Visit

## 2024-05-15 DIAGNOSIS — M6281 Muscle weakness (generalized): Secondary | ICD-10-CM

## 2024-05-15 DIAGNOSIS — M79602 Pain in left arm: Secondary | ICD-10-CM

## 2024-05-15 DIAGNOSIS — M25512 Pain in left shoulder: Secondary | ICD-10-CM

## 2024-05-15 DIAGNOSIS — M542 Cervicalgia: Secondary | ICD-10-CM | POA: Diagnosis not present

## 2024-05-15 NOTE — Therapy (Signed)
 OUTPATIENT PHYSICAL THERAPY TREATMENT NOTE   Patient Name: [name] MRN: [id number] DOB:1966/09/14, 58 y.o., female Today's Date: 05/15/2024  END OF SESSION:  PT End of Session - 05/15/24 1150     Visit Number 7    Number of Visits 9    Date for PT Re-Evaluation 05/12/24    Authorization Type [employer] medicaid  $4 copay  Approved 11 PT visits 04/19/2024 - 07/17/2024    PT Start Time 1145    PT Stop Time 1230    PT Time Calculation (min) 45 min    Activity Tolerance Patient tolerated treatment well    Behavior During Therapy [facility] for tasks assessed/performed               Past Medical History:  Diagnosis Date   Anemia    CAD (coronary artery disease)    PCI with stent to RCA in 2019   CHF (congestive heart failure) (HCC) 04/2011   EF 15-20% from echo 05/19/11   DOE (dyspnea on exertion)    DVT (deep venous thrombosis) (HCC) 06/2011   LLE   Fatigue    HTN (hypertension)    Hyperlipidemia    Menorrhagia    with iron  deficient anemia   NSTEMI (non-ST elevated myocardial infarction) (HCC) 10/26/2017   NSTEMI (non-ST elevated myocardial infarction) (HCC) 10/27/2017   Obesity    Orthopnea    Type II diabetes mellitus (HCC)    Past Surgical History:  Procedure Laterality Date   CORONARY ANGIOPLASTY WITH STENT PLACEMENT  2013;  10/27/2017   CORONARY BALLOON ANGIOPLASTY N/A 05/26/2022   Procedure: CORONARY BALLOON ANGIOPLASTY;  Surgeon: Elmira Newman PARAS, MD;  Location: [facility];  Service: Cardiovascular;  Laterality: N/A;   CORONARY STENT INTERVENTION N/A 10/27/2017   Procedure: CORONARY STENT INTERVENTION;  Surgeon: Elmira Newman PARAS, MD;  Location: [facility];  Service: Cardiovascular;  Laterality: N/A;   CORONARY STENT INTERVENTION N/A 03/10/2022   Procedure: CORONARY STENT INTERVENTION;  Surgeon: Ladona Heinz, MD;  Location: [facility];  Service: Cardiovascular;  Laterality: N/A;   CORONARY STENT INTERVENTION N/A 03/11/2022    Procedure: CORONARY STENT INTERVENTION;  Surgeon: Elmira Newman PARAS, MD;  Location: [facility];  Service: Cardiovascular;  Laterality: N/A;  aborted   CORONARY STENT INTERVENTION N/A 10/20/2022   Procedure: CORONARY STENT INTERVENTION;  Surgeon: Elmira Newman PARAS, MD;  Location: [facility];  Service: Cardiovascular;  Laterality: N/A;   CORONARY ULTRASOUND/IVUS N/A 05/26/2022   Procedure: Intravascular Ultrasound/IVUS;  Surgeon: Elmira Newman PARAS, MD;  Location: [facility];  Service: Cardiovascular;  Laterality: N/A;   CORONARY ULTRASOUND/IVUS N/A 10/20/2022   Procedure: Intravascular Ultrasound/IVUS;  Surgeon: Elmira Newman PARAS, MD;  Location: [facility];  Service: Cardiovascular;  Laterality: N/A;   LEFT HEART CATH AND CORONARY ANGIOGRAPHY N/A 10/27/2017   Procedure: LEFT HEART CATH AND CORONARY ANGIOGRAPHY;  Surgeon: Elmira Newman PARAS, MD;  Location: [facility];  Service: Cardiovascular;  Laterality: N/A;   LEFT HEART CATH AND CORONARY ANGIOGRAPHY N/A 11/17/2019   Procedure: LEFT HEART CATH AND CORONARY ANGIOGRAPHY;  Surgeon: Elmira Newman PARAS, MD;  Location: [facility];  Service: Cardiovascular;  Laterality: N/A;   LEFT HEART CATH AND CORONARY ANGIOGRAPHY N/A 03/10/2022   Procedure: LEFT HEART CATH AND CORONARY ANGIOGRAPHY;  Surgeon: Ladona Heinz, MD;  Location: [facility];  Service: Cardiovascular;  Laterality: N/A;   LEFT HEART CATH AND CORONARY ANGIOGRAPHY N/A  07/27/2022   Procedure: LEFT HEART CATH AND CORONARY ANGIOGRAPHY;  Surgeon: Ladona Heinz, MD;  Location: [facility];  Service: Cardiovascular;  Laterality: N/A;   LEFT HEART CATH AND CORONARY ANGIOGRAPHY N/A 10/20/2022   Procedure: LEFT HEART CATH AND CORONARY ANGIOGRAPHY;  Surgeon: Elmira Newman PARAS, MD;  Location: [facility];  Service: Cardiovascular;  Laterality: N/A;   LEFT HEART CATHETERIZATION WITH CORONARY ANGIOGRAM N/A 11/17/2011   Procedure: LEFT HEART  CATHETERIZATION WITH CORONARY ANGIOGRAM;  Surgeon: Erick JONELLE Ladona, MD;  Location: [facility] CATH [facility];  Service: Cardiovascular;  Laterality: N/A;   OVARIAN CYST REMOVAL     TUBAL LIGATION  2006   ULTRASOUND GUIDANCE FOR VASCULAR ACCESS  10/27/2017   Procedure: Ultrasound Guidance For Vascular Access;  Surgeon: Elmira Newman PARAS, MD;  Location: [facility];  Service: Cardiovascular;;   Patient Active Problem List   Diagnosis Date Noted   Coronary artery disease involving native coronary artery of native heart without angina pectoris    Pleuritic pain 07/25/2022   History of non-ST elevation myocardial infarction (NSTEMI)    History of coronary angioplasty with insertion of stent    Mixed hyperlipidemia    Post PTCA 05/26/2022   Chronic iron  deficiency anemia 03/10/2022   Chronic diastolic CHF (congestive heart failure) (HCC) 03/10/2022   Non-insulin  dependent type 2 diabetes mellitus (HCC) 03/10/2022   Morbidly obese (HCC) 08/19/2020   Uncontrolled type 2 diabetes mellitus with hyperglycemia (HCC) 08/19/2020   Unstable angina (HCC) 11/16/2019   Non-ST elevation (NSTEMI) myocardial infarction (HCC) 10/27/2017   OSA (obstructive sleep apnea) 02/21/2013   Atherosclerosis of native coronary artery of native heart with stable angina pectoris (HCC) 11/18/2011   Dyslipidemia 11/18/2011   Precordial pain 11/17/2011   Abnormal cardiovascular stress test 11/17/2011   DVT (deep venous thrombosis) (HCC) 11/01/2011   Essential hypertension 11/01/2011   Blood loss anemia 11/01/2011   Exertional dyspnea 08/19/2011    PCP: Schuyler Lesser, PA-C  REFERRING PROVIDER: Tobie Tonita POUR, DO  REFERRING DIAG: R20.0 (ICD-10-CM) - Left arm numbness  THERAPY DIAG:  Pain in left arm  Muscle weakness (generalized)  Acute pain of left shoulder  Neck pain  Rationale for Evaluation and Treatment: Rehabilitation  ONSET DATE: 04/10/2024- date of referral  SUBJECTIVE:                                                                                                                                                                                                          SUBJECTIVE STATEMENT: Pt reports significant improvement in pain  after electrical stimulation used last time. Pt reports she only had 4/10 at worst since last session and currently 0/10 in shoulder. Pt reports she still feels muscles spasms in left side of neck and gripping feeling at deltoid insertion. Hand dominance: Right  PERTINENT HISTORY:  CHF, CAD, DM  PAIN:  Are you having pain? Yes: NPRS scale: 4/10 Pain location: L side of neck, L arm Pain description: radiating, heavy, tingling Aggravating factors: unsure Relieving factors: unsure  PRECAUTIONS: None  RED FLAGS: None     WEIGHT BEARING RESTRICTIONS: No  FALLS:  Has patient fallen in last 6 months? No  PLOF: Independent  PATIENT GOALS: improve pain    OBJECTIVE:  Note: Objective measures were completed at Evaluation unless otherwise noted.  DIAGNOSTIC FINDINGS:  MRI of Brain: 02/17/24   IMPRESSION: 1. No acute intracranial abnormality. 2. Moderate cerebral white matter disease, nonspecific, but most likely related chronic microvascular ischemic disease. Probable tiny remote left cerebellar infarct.  PATIENT SURVEYS:   COGNITION: Overall cognitive status: Within functional limits for tasks assessed  PALPATION: GH capsule hypomobility grossly   CERVICAL ROM:   Active ROM A/PROM (deg) eval  Flexion 75  Extension 70  Right lateral flexion   Left lateral flexion   Right rotation 75  Left rotation 80   (Blank rows = not tested)  UPPER EXTREMITY ROM:  Active/Passive ROM Right eval Left eval  Shoulder flexion 180/180 120/120  Shoulder extension 180/180 90/100  Shoulder abduction    Shoulder adduction    Shoulder extension    Shoulder internal rotation T6 T12  Shoulder external rotation T2 L occiput  Elbow flexion    Elbow  extension    Wrist flexion    Wrist extension    Wrist ulnar deviation    Wrist radial deviation    Wrist pronation    Wrist supination     (Blank rows = not tested)  UPPER EXTREMITY MMT:  MMT Right eval Left eval  Shoulder flexion 5/5 3+/5  Shoulder extension    Shoulder abduction 5/5 3+/5  Shoulder adduction    Shoulder extension    Shoulder internal rotation 5/5 3+/5  Shoulder external rotation 5/5 3+/5  Middle trapezius    Lower trapezius    Elbow flexion 5/5 3+/5  Elbow extension 5/5 3+/5  Wrist flexion    Wrist extension    Wrist ulnar deviation    Wrist radial deviation    Wrist pronation    Wrist supination    Grip strength 69 25   (Blank rows = not tested)   TREATMENT DATE:   05/15/24 Patient educated on minimizing sleeping on her left side. Discussed using pillow anteriroly to prevent fwd lean when sleeping on left side to avoid directly laying on L shoulder.  Table slides into flexion: 3 x 10 Tracing alphabets on table with towel: A-Z: 1x needed to rest due to fatigue and soreness  Clothes pin on vertical rod: black, blue, green, red, yellow: 5 each, starting from top of the bar and working down using only L UE; then taking them all off and putting them down. 1lb weight on L wrist. Pt discussed pain management strategy on always doing hard to easy activities as she will have more strength and stamina to do harder activities first. So we discussed with this exercises to start with black pins and placing them on highest part of the vertical bar and working her way down the bar to yellow pins.    Juxerciser: 6x,  with 1lb weight on wrist  E-stim: interferential, 12.0 volts, 4 pads applied around shoulder/neck region on L shoulder for 20' with hot pack in neck and on shoulder. (E-stim precautions/contraindications reviewed prior to application)       PATIENT EDUCATION:  Education details: see above Person educated: Patient Education method:  Explanation Education comprehension: verbalized understanding  HOME EXERCISE PROGRAM: 04/14/24: Standing wall slides into scaption: 10x Access Code: [email or web address] URL: [email or web address] Date: 04/25/2024 Prepared by: Raj Blanch  Exercises - Shoulder Flexion Wall Slide with Towel  - 1-2 x daily - 7 x weekly - 2 sets - 10 reps - Supine Shoulder Press with Dowel  - 1-2 x daily - 7 x weekly - 2 sets - 10 reps - Supine Shoulder Flexion with Dowel  - 1-2 x daily - 7 x weekly - 2 sets - 10 reps  Access Code: [email or web address] URL: [email or web address] Date: 05/03/2024 Prepared by: Raj Blanch  Exercises - Seated Bilateral Shoulder Flexion Towel Slide at Table Top  - 1 x daily - 7 x weekly - 2 sets - 10 reps - Standing Shoulder Row with Anchored Resistance  - 1 x daily - 7 x weekly - 2 sets - 10 reps - Seated Scapular Retraction  - 1 x daily - 7 x weekly - 2 sets - 10 reps - Split Stance Single Arm Chest Press with Resistance  - 1 x daily - 7 x weekly - 2 sets - 10 reps - Standing Bent Over Single Arm Shoulder Row  - 1 x daily - 7 x weekly - 2 sets - 10 reps  ASSESSMENT:  CLINICAL IMPRESSION: Today's session focused on continued progression of ROM and strength. Pt still demo decreased endurance in L shoulder where she requires multiple breaks to complete exercise which is mostly limited due to decreased strength and mm endurance in left shoulder. Pt responded very well with electrical stimulation and will continue to use it for pain management.   OBJECTIVE IMPAIRMENTS: decreased ROM, decreased strength, hypomobility, increased fascial restrictions, increased muscle spasms, impaired flexibility, impaired UE functional use, postural dysfunction, and pain.   ACTIVITY LIMITATIONS: carrying and sleeping  PARTICIPATION LIMITATIONS: meal prep, cleaning, laundry, driving, shopping, and community activity  PERSONAL FACTORS: Age and Time since onset of  injury/illness/exacerbation are also affecting patient's functional outcome.   REHAB POTENTIAL: Good  CLINICAL DECISION MAKING: Stable/uncomplicated  EVALUATION COMPLEXITY: Moderate   GOALS: Goals reviewed with patient? Yes  SHORT TERM GOALS: No STG established due to POC <4 weeks   LONG TERM GOALS: Target date: 05/12/2024    Patient will be 100% compliant with HEP to self manage symptoms at home  Baseline: initiated 04/14/24 Goal status: INITIAL  2.  Patient will demo at least 10% improvement on UEFS to improve overall function. Baseline: TBD Goal status: INITIAL  3.  Patient will demo grip strength in L UE to be >50 lbs to improve ability to hold and carry objects in L hand Baseline: 29 lbs (04/14/24) Goal status: INITIAL  4.  Patient will demo L shoulder AROM to be above 160 deg to improve ability to reach in overhead cabinets. Baseline: 120 deg Goal status: INITIAL  5.  Pt will report overall max pain of <5/10 in L shoulder and L arm to improve function with L arm Baseline: 10/10 (04/14/24) Goal status: INITIAL   PLAN:  PT FREQUENCY: 2x/week  PT DURATION: 4 weeks  PLANNED INTERVENTIONS: [id number]- PT Re-evaluation, 97750- Physical Performance Testing, 97110-Therapeutic exercises, 97530- Therapeutic activity, 97112-  Neuromuscular re-education, [phone]- Self Care, [id number]- Manual therapy, [id number]- Electrical stimulation (unattended), Patient/Family education, Joint mobilization, Joint manipulation, Spinal manipulation, Spinal mobilization, DME instructions, Cryotherapy, and Moist heat  PLAN FOR NEXT SESSION: Adhesive capsulitis, progress HEP   Raj LOISE Blanch, PT 05/15/2024, 11:51 AM

## 2024-05-17 ENCOUNTER — Ambulatory Visit

## 2024-05-17 DIAGNOSIS — M79602 Pain in left arm: Secondary | ICD-10-CM

## 2024-05-17 DIAGNOSIS — M6281 Muscle weakness (generalized): Secondary | ICD-10-CM

## 2024-05-17 DIAGNOSIS — M25512 Pain in left shoulder: Secondary | ICD-10-CM

## 2024-05-17 DIAGNOSIS — M542 Cervicalgia: Secondary | ICD-10-CM

## 2024-05-17 NOTE — Therapy (Signed)
 OUTPATIENT PHYSICAL THERAPY TREATMENT NOTE   Patient Name: [name] MRN: [id number] DOB:Mar 20, 1966, 58 y.o., female Today's Date: 05/17/2024  END OF SESSION:  PT End of Session - 05/17/24 1152     Visit Number 8    Number of Visits 18    Date for PT Re-Evaluation 07/26/24    Authorization Type [employer] [place] medicaid  $4 copay  Approved 11 PT visits 04/19/2024 - 07/17/2024; new submission for Medicaid submitted 05/17/24    PT Start Time 1145    PT Stop Time 1230    PT Time Calculation (min) 45 min    Activity Tolerance Patient tolerated treatment well    Behavior During Therapy [facility] for tasks assessed/performed               Past Medical History:  Diagnosis Date   Anemia    CAD (coronary artery disease)    PCI with stent to RCA in 2019   CHF (congestive heart failure) (HCC) 04/2011   EF 15-20% from echo 05/19/11   DOE (dyspnea on exertion)    DVT (deep venous thrombosis) (HCC) 06/2011   LLE   Fatigue    HTN (hypertension)    Hyperlipidemia    Menorrhagia    with iron  deficient anemia   NSTEMI (non-ST elevated myocardial infarction) (HCC) 10/26/2017   NSTEMI (non-ST elevated myocardial infarction) (HCC) 10/27/2017   Obesity    Orthopnea    Type II diabetes mellitus (HCC)    Past Surgical History:  Procedure Laterality Date   CORONARY ANGIOPLASTY WITH STENT PLACEMENT  2013;  10/27/2017   CORONARY BALLOON ANGIOPLASTY N/A 05/26/2022   Procedure: CORONARY BALLOON ANGIOPLASTY;  Surgeon: Elmira Newman PARAS, MD;  Location: [facility];  Service: Cardiovascular;  Laterality: N/A;   CORONARY STENT INTERVENTION N/A 10/27/2017   Procedure: CORONARY STENT INTERVENTION;  Surgeon: Elmira Newman PARAS, MD;  Location: [facility];  Service: Cardiovascular;  Laterality: N/A;   CORONARY STENT INTERVENTION N/A 03/10/2022   Procedure: CORONARY STENT INTERVENTION;  Surgeon: Ladona Heinz, MD;  Location: [facility];  Service: Cardiovascular;  Laterality: N/A;    CORONARY STENT INTERVENTION N/A 03/11/2022   Procedure: CORONARY STENT INTERVENTION;  Surgeon: Elmira Newman PARAS, MD;  [facility]: [facility];  Service: Cardiovascular;  Laterality: N/A;  aborted   CORONARY STENT INTERVENTION N/A 10/20/2022   Procedure: CORONARY STENT INTERVENTION;  Surgeon: Elmira Newman PARAS, MD;  [facility];  Service: Cardiovascular;  Laterality: N/A;   CORONARY ULTRASOUND/IVUS N/A 05/26/2022   Procedure: Intravascular Ultrasound/IVUS;  Surgeon: Elmira Newman PARAS, MD;  [facility];  Service: Cardiovascular;  Laterality: N/A;   CORONARY ULTRASOUND/IVUS N/A 10/20/2022   Procedure: Intravascular Ultrasound/IVUS;  Surgeon: Elmira Newman PARAS, MD;  [facility];  Service: Cardiovascular;  Laterality: N/A;   LEFT HEART CATH AND CORONARY ANGIOGRAPHY N/A 10/27/2017   Procedure: LEFT HEART CATH AND CORONARY ANGIOGRAPHY;  Surgeon: Elmira Newman PARAS, MD;  [facility];  Service: Cardiovascular;  Laterality: N/A;   LEFT HEART CATH AND CORONARY ANGIOGRAPHY N/A 11/17/2019   Procedure: LEFT HEART CATH AND CORONARY ANGIOGRAPHY;  Surgeon: Elmira Newman PARAS, MD;  [facility];  Service: Cardiovascular;  Laterality: N/A;   LEFT HEART CATH AND CORONARY ANGIOGRAPHY N/A 03/10/2022   Procedure: LEFT HEART CATH AND CORONARY ANGIOGRAPHY;  Surgeon: Ladona Heinz, MD;  [facility];  Service: Cardiovascular;  Laterality: N/A;   LEFT  HEART CATH AND CORONARY ANGIOGRAPHY N/A 07/27/2022   Procedure: LEFT HEART CATH AND CORONARY ANGIOGRAPHY;  Surgeon: Ladona Heinz, MD;  [facility];  Service: Cardiovascular;  Laterality: N/A;   LEFT HEART CATH AND CORONARY ANGIOGRAPHY N/A 10/20/2022   Procedure: LEFT HEART CATH AND CORONARY ANGIOGRAPHY;  Surgeon: Elmira Newman PARAS, MD;  [facility];  Service: Cardiovascular;  Laterality: N/A;   LEFT HEART CATHETERIZATION WITH CORONARY  ANGIOGRAM N/A 11/17/2011   Procedure: LEFT HEART CATHETERIZATION WITH CORONARY ANGIOGRAM;  Surgeon: Erick JONELLE Ladona, MD;  Location: [facility];  Service: Cardiovascular;  Laterality: N/A;   OVARIAN CYST REMOVAL     TUBAL LIGATION  2006   ULTRASOUND GUIDANCE FOR VASCULAR ACCESS  10/27/2017   Procedure: Ultrasound Guidance For Vascular Access;  Surgeon: Elmira Newman PARAS, MD;  [facility]: [facility];  Service: Cardiovascular;;   Patient Active Problem List   Diagnosis Date Noted   Coronary artery disease involving native coronary artery of native heart without angina pectoris    Pleuritic pain 07/25/2022   History of non-ST elevation myocardial infarction (NSTEMI)    History of coronary angioplasty with insertion of stent    Mixed hyperlipidemia    Post PTCA 05/26/2022   Chronic iron  deficiency anemia 03/10/2022   Chronic diastolic CHF (congestive heart failure) (HCC) 03/10/2022   Non-insulin  dependent type 2 diabetes mellitus (HCC) 03/10/2022   Morbidly obese (HCC) 08/19/2020   Uncontrolled type 2 diabetes mellitus with hyperglycemia (HCC) 08/19/2020   Unstable angina (HCC) 11/16/2019   Non-ST elevation (NSTEMI) myocardial infarction (HCC) 10/27/2017   OSA (obstructive sleep apnea) 02/21/2013   Atherosclerosis of native coronary artery of native heart with stable angina pectoris (HCC) 11/18/2011   Dyslipidemia 11/18/2011   Precordial pain 11/17/2011   Abnormal cardiovascular stress test 11/17/2011   DVT (deep venous thrombosis) (HCC) 11/01/2011   Essential hypertension 11/01/2011   Blood loss anemia 11/01/2011   Exertional dyspnea 08/19/2011    PCP: Schuyler Lesser, PA-C  REFERRING PROVIDER: Kingsly Kloepfer, Donika K, DO  REFERRING DIAG: R20.0 (ICD-10-CM) - Left arm numbness  THERAPY DIAG:  Pain in left arm  Muscle weakness (generalized)  Acute pain of left shoulder  Neck pain  Rationale for Evaluation and Treatment: Rehabilitation  ONSET DATE: 04/10/2024- date of  referral  SUBJECTIVE:                                                                                                                                                                                                         SUBJECTIVE STATEMENT:  Pt reports after last session she felt good but she was watching TV in the evening and felt the L shoulder pain gradually creeping up and it was painful at 8/10. She took Tylenol . Pt reports she was just sitting and not doing anything with L arm when she felt pain creeping up. Currently she still feels someone is squeezing outside of L shoulder. Pain is minimal at rest currently. Hand dominance: Right  PERTINENT HISTORY:  CHF, CAD, DM  PAIN:  Are you having pain? Yes: NPRS scale: 4/10 Pain location: L side of neck, L arm Pain description: radiating, heavy, tingling Aggravating factors: unsure Relieving factors: unsure  PRECAUTIONS: None  RED FLAGS: None     WEIGHT BEARING RESTRICTIONS: No  FALLS:  Has patient fallen in last 6 months? No  PLOF: Independent  PATIENT GOALS: improve pain    OBJECTIVE:  Note: Objective measures were completed at Evaluation unless otherwise noted.  DIAGNOSTIC FINDINGS:  MRI of Brain: 02/17/24   IMPRESSION: 1. No acute intracranial abnormality. 2. Moderate cerebral white matter disease, nonspecific, but most likely related chronic microvascular ischemic disease. Probable tiny remote left cerebellar infarct.  PATIENT SURVEYS:   COGNITION: Overall cognitive status: Within functional limits for tasks assessed  PALPATION: GH capsule hypomobility grossly   CERVICAL ROM:   Active ROM A/PROM (deg) eval  Flexion 75  Extension 70  Right lateral flexion   Left lateral flexion   Right rotation 75  Left rotation 80   (Blank rows = not tested)  UPPER EXTREMITY ROM:  Active/Passive ROM Right eval Left eval Left 05/17/24  Shoulder flexion 180/180 120/120 135/140  Shoulder abduction 180/180  90/100 130/130  Shoulder extension     Shoulder adduction     Shoulder extension     Shoulder internal rotation T6 T12   Shoulder external rotation T2 L occiput   Elbow flexion     Elbow extension     Wrist flexion     Wrist extension     Wrist ulnar deviation     Wrist radial deviation     Wrist pronation     Wrist supination      (Blank rows = not tested)  UPPER EXTREMITY MMT:  MMT Right eval Left eval Right 05/17/24 Left 05/17/24  Shoulder flexion 5/5 3+/5    Shoulder extension      Shoulder abduction 5/5 3+/5    Shoulder adduction      Shoulder extension      Shoulder internal rotation 5/5 3+/5    Shoulder external rotation 5/5 3+/5    Middle trapezius      Lower trapezius      Elbow flexion 5/5 3+/5    Elbow extension 5/5 3+/5    Wrist flexion      Wrist extension      Wrist ulnar deviation      Wrist radial deviation      Wrist pronation      Wrist supination      Grip strength 69 25 71 28   (Blank rows = not tested)   Extreme difficulty/unable (0), Quite a bit of difficulty (1), Moderate difficulty (2), Little difficulty (3), No difficulty (4) Survey date:  05/17/24  Any of your usual work, household or school activities 1  2. Your usual hobbies, recreational/sport activities 0   3. Lifting a bag of groceries to waist level 1   4. Lifting a bag of groceries above your head 0  5. Grooming your hair 2  6. Pushing up on your hands (I.e. from bathtub or chair) 3  7. Preparing food (I.e. peeling/cutting) 0  8. Driving  0  9. Vacuuming, sweeping, or raking 1  10. Dressing  4  11. Doing up buttons 4  12. Using tools/appliances 1  13. Opening doors 3  14. Cleaning  0  15. Tying or lacing shoes 3  16. Sleeping  2  17. Laundering clothes (I.e. washing, ironing, folding) 0  18. Opening a jar 0  19. Throwing a ball 2  20. Carrying a small suitcase with your affected limb.  0  Score total:  27/80      TREATMENT DATE:   Reassessment performed  today; Seated ball ross into flexion: 5 x 5 Gave patient putty exercises: gripping, rolling, pinching, red putty given to patient We discussed taking 2 weeks off until she gets referral for orthopedic MD and gets to see them and discuss cortisone shot for improved pain maangement.          PATIENT EDUCATION:  Education details: see above Person educated: Patient Education method: Explanation Education comprehension: verbalized understanding  HOME EXERCISE PROGRAM: 04/14/24: Standing wall slides into scaption: 10x Access Code: [email or web address] URL: [email or web address] Date: 04/25/2024 Prepared by: Raj Blanch  Exercises - Shoulder Flexion Wall Slide with Towel  - 1-2 x daily - 7 x weekly - 2 sets - 10 reps - Supine Shoulder Press with Dowel  - 1-2 x daily - 7 x weekly - 2 sets - 10 reps - Supine Shoulder Flexion with Dowel  - 1-2 x daily - 7 x weekly - 2 sets - 10 reps  Access Code: [email or web address] URL: [email or web address] Date: 05/03/2024 Prepared by: Raj Blanch  Exercises - Seated Bilateral Shoulder Flexion Towel Slide at Table Top  - 1 x daily - 7 x weekly - 2 sets - 10 reps - Standing Shoulder Row with Anchored Resistance  - 1 x daily - 7 x weekly - 2 sets - 10 reps - Seated Scapular Retraction  - 1 x daily - 7 x weekly - 2 sets - 10 reps - Split Stance Single Arm Chest Press with Resistance  - 1 x daily - 7 x weekly - 2 sets - 10 reps - Standing Bent Over Single Arm Shoulder Row  - 1 x daily - 7 x weekly - 2 sets - 10 reps  ASSESSMENT:  CLINICAL IMPRESSION: Patient has been seen for total of 12 sessions for L shoulder adhesive capsulitis and L shoulder tendinosis. Patient has demo improved L shoulder AROM and mild improvement in her L shoulder pain since the evaluation. Patient will benefit from potential corticosteroid injection for further pain management. Patient will benefit from continued skilled PT to improve pain/rom and  function. OBJECTIVE IMPAIRMENTS: decreased ROM, decreased strength, hypomobility, increased fascial restrictions, increased muscle spasms, impaired flexibility, impaired UE functional use, postural dysfunction, and pain.   ACTIVITY LIMITATIONS: carrying and sleeping  PARTICIPATION LIMITATIONS: meal prep, cleaning, laundry, driving, shopping, and community activity  PERSONAL FACTORS: Age and Time since onset of injury/illness/exacerbation are also affecting patient's functional outcome.   REHAB POTENTIAL: Good  CLINICAL DECISION MAKING: Stable/uncomplicated  EVALUATION COMPLEXITY: Moderate   GOALS: Goals reviewed with patient? Yes  SHORT TERM GOALS: No STG established due to POC <4 weeks   LONG TERM GOALS: Target date: 07/26/2024   Patient will be 100% compliant with HEP to self manage symptoms at home  Baseline: initiated 04/14/24 Goal status: progressing  2.  Patient will demo  at least 10% improvement on UEFS to improve overall function. Baseline: TBD; 27/80 (05/17/24) Goal status: progresing  3.  Patient will demo grip strength in L UE to be >50 lbs to improve ability to hold and carry objects in L hand Baseline: 29 lbs (04/14/24); 28 lbs (05/17/24) Goal status: progressing  4.  Patient will demo L shoulder AROM to be above 160 deg to improve ability to reach in overhead cabinets. Baseline: 120 deg (eval); 135 deg (05/17/24) Goal status: progressing  5.  Pt will report overall max pain of <5/10 in L shoulder and L arm to improve function with L arm Baseline: 10/10 (04/14/24); 6-8/10 at worst (05/17/24) Goal status: progressing   PLAN:  PT FREQUENCY: 2x/week  PT DURATION: 10 weeks  PLANNED INTERVENTIONS: 97164- PT Re-evaluation, 97750- Physical Performance Testing, 97110-Therapeutic exercises, 97530- Therapeutic activity, 97112- Neuromuscular re-education, 97535- Self Care, [id number]- Manual therapy, [id number]- Electrical stimulation (unattended), Patient/Family education, Joint  mobilization, Joint manipulation, Spinal manipulation, Spinal mobilization, DME instructions, Cryotherapy, and Moist heat  PLAN FOR NEXT SESSION: Adhesive capsulitis, progress HEP   Raj LOISE Blanch, PT 05/17/2024, 12:36 PM

## 2024-05-19 ENCOUNTER — Ambulatory Visit: Payer: Self-pay | Admitting: Neurology

## 2024-05-19 ENCOUNTER — Ambulatory Visit: Admitting: Neurology

## 2024-05-19 DIAGNOSIS — G8929 Other chronic pain: Secondary | ICD-10-CM

## 2024-05-19 DIAGNOSIS — R200 Anesthesia of skin: Secondary | ICD-10-CM | POA: Diagnosis not present

## 2024-05-19 DIAGNOSIS — G5602 Carpal tunnel syndrome, left upper limb: Secondary | ICD-10-CM

## 2024-05-19 NOTE — Procedures (Signed)
  [facility]  [address], [address]  [place], [place] [address] Tel: [phone] Fax: [phone] Test Date:  05/19/2024  Patient: [name] DOB: 08-Oct-1966 Physician: Tonita Blanch, DO  Sex: Female Height: 5' 2 Ref Phys: Tonita Blanch, DO  ID#: [id number]   Technician:    History: This is a 58 year old female referred for evaluation of left arm numbness and pain.  NCV & EMG Findings: Extensive electrodiagnostic testing of the left upper extremity shows:  Left median sensory response shows prolonged latency (4.1 ms).  Left ulnar sensory response is within normal limits. Left median motor response shows prolonged latency (4.1 ms).  Left ulnar motor response is within normal limits.   There is no evidence of active or chronic motor axonal loss changes affecting any of the tested muscles.  Motor unit configuration and recruitment pattern is within normal limits.    Impression: Left median neuropathy at or distal to the wrist, consistent with a clinical diagnosis of carpal tunnel syndrome.  Overall, these findings are moderate in degree electrically. There is no evidence of a left cervical radiculopathy.   ___________________________ Tonita Blanch, DO    Nerve Conduction Studies   Stim Site NR Peak (ms) Norm Peak (ms) O-P Amp (V) Norm O-P Amp  Left Median Anti Sensory (2nd Digit)  32 C  Wrist    *4.1 <3.6 18.9 >15  Left Ulnar Anti Sensory (5th Digit)  32 C  Wrist    2.3 <3.1 40.9 >10     Stim Site NR Onset (ms) Norm Onset (ms) O-P Amp (mV) Norm O-P Amp Site1 Site2 Delta-0 (ms) Dist (cm) Vel (m/s) Norm Vel (m/s)  Left Median Motor (Abd Poll Brev)  32 C  Wrist    *4.1 <4.0 9.8 >6 Elbow Wrist 5.4 29.0 54 >50  Elbow    9.5  9.1         Left Ulnar Motor (Abd Dig Minimi)  32 C  Wrist    2.0 <3.1 9.6 >7 B Elbow Wrist 3.4 23.0 68 >50  B Elbow    5.4  9.0  A Elbow B Elbow 1.5 10.0 67 >50  A Elbow    6.9  8.9          Electromyography   Side Muscle Ins.Act  Fibs Fasc Recrt Amp Dur Poly Activation Comment  Left 1stDorInt Nml Nml Nml Nml Nml Nml Nml Nml N/A  Left Abd Poll Brev Nml Nml Nml Nml Nml Nml Nml Nml N/A  Left PronatorTeres Nml Nml Nml Nml Nml Nml Nml Nml N/A  Left Biceps Nml Nml Nml Nml Nml Nml Nml Nml N/A  Left Triceps Nml Nml Nml Nml Nml Nml Nml Nml N/A  Left Deltoid Nml Nml Nml Nml Nml Nml Nml Nml N/A      Waveforms:

## 2024-05-22 ENCOUNTER — Ambulatory Visit

## 2024-05-25 ENCOUNTER — Telehealth: Payer: Self-pay

## 2024-05-25 DIAGNOSIS — G8929 Other chronic pain: Secondary | ICD-10-CM

## 2024-05-25 DIAGNOSIS — G5602 Carpal tunnel syndrome, left upper limb: Secondary | ICD-10-CM

## 2024-05-25 DIAGNOSIS — R200 Anesthesia of skin: Secondary | ICD-10-CM

## 2024-05-25 NOTE — Telephone Encounter (Signed)
-----   Message from Tonita MARLA Blanch sent at 05/18/2024 10:09 AM EDT ----- Regarding: FW: Patient POC Thank you for the update.  We will place referral to ortho as requested for left shoulder pain.  Mattew Chriswell, please add referral - you may need to send this note along with referral, since my clinic visit did not evaluate for left shoulder pain.    DP ----- Message ----- From: Blanch Raj SAILOR, PT Sent: 05/17/2024  12:10 PM EDT To: Donika K Patel, DO Subject: Patient POC                                    Good morning Dr. Blanch,  I have seen Barnie for about 12 sessions. Her AROM is getting better and there is some mild improvement in her pain. I am suspecting adhesive capsulitis with secondary tendinosis in L shoulder.   Because she has Medicaid and Medicaid only allows 27 rehab sessions per year, I think this is a good time to trial Corticosteroid injections in her L shoulder to see if she can get some more pain relief.  What are your thoughts?   If you agree, can your office make recommendations for orthopedic referral? I am hoping she can see orthopedic MD in next 1-2 weeks if possible.  Raj Blanch, PT

## 2024-05-29 ENCOUNTER — Ambulatory Visit

## 2024-05-31 ENCOUNTER — Ambulatory Visit

## 2024-06-02 ENCOUNTER — Other Ambulatory Visit: Payer: Self-pay | Admitting: Cardiology

## 2024-06-05 ENCOUNTER — Other Ambulatory Visit: Payer: Self-pay | Admitting: Cardiology

## 2024-06-05 ENCOUNTER — Ambulatory Visit

## 2024-06-07 ENCOUNTER — Ambulatory Visit

## 2024-06-08 ENCOUNTER — Ambulatory Visit: Admitting: Orthopedic Surgery

## 2024-06-08 ENCOUNTER — Other Ambulatory Visit: Payer: Self-pay

## 2024-06-08 DIAGNOSIS — M25512 Pain in left shoulder: Secondary | ICD-10-CM

## 2024-06-08 DIAGNOSIS — M542 Cervicalgia: Secondary | ICD-10-CM | POA: Diagnosis not present

## 2024-06-08 DIAGNOSIS — G8929 Other chronic pain: Secondary | ICD-10-CM | POA: Diagnosis not present

## 2024-06-08 NOTE — Progress Notes (Signed)
 Office Visit Note   Patient: [name]           Date of Birth: July 23, 1966           MRN: [id number] Visit Date: 06/08/2024 Requested by: Tobie Tonita POUR, DO [address] [place],  [place] [id number] PCP: Linde Bitters, PA-C  Subjective: Chief Complaint  Patient presents with   Neck - Pain   Left Shoulder - Pain    HPI: [name] is a 58 y.o. female who presents to the office reporting left shoulder.  Pain.  She woke up online in April and her entire left arm was numb.  She had a turning sensation in the left shoulder that radiated into the neck.  She also reported a little facial numbness at that time.  She had a workup for stroke which was negative.  Went to the neurologist and they sent her to physical therapy.  Nerve conduction study demonstrated carpal tunnel on the left.  She was also observed to have painful range of motion of that left shoulder.  She still is in physical therapy.  Patient describes decreased motion in the left arm compared to the right arm.  Reports both weakness and pain on that left-hand side.  Pain radiates from the neck to the deltoid.  Worse with activity.  Denies any mechanical symptoms.  She is retired.  Symptoms will occasionally wake her from sleep.  She does report whole hand palmar and dorsal numbness on the left-hand side..                ROS: All systems reviewed are negative as they relate to the chief complaint within the history of present illness.  Patient denies fevers or chills.  Assessment & Plan: Visit Diagnoses:  1. Neck pain   2. Chronic left shoulder pain     Plan: Impression is possible adhesive capsulitis in that left shoulder with decreased passive range of motion.  She may also have some other pathology in the shoulder which is not immediately apparent on exam.  I think she also describes symptoms consistent with left-sided radiculopathy.  Symptoms now been ongoing for about 5 months.  Hard to say if the  carpal tunnel syndrome in the left hand which is moderate could be radiating pain all the way up into her shoulder and neck.  Plan at this time is MRI arthrogram left shoulder to evaluate adhesive capsulitis and other structural problems potentially with the biceps tendon in that left shoulder.  MRI C-spine also indicated to evaluate left-sided radiculopathy.  Follow-up after that study.  Follow-Up Instructions: No follow-ups on file.   Orders:  Orders Placed This Encounter  Procedures   XR Cervical Spine 2 or 3 views   XR Shoulder Left   No orders of the defined types were placed in this encounter.     Procedures: No procedures performed   Clinical Data: No additional findings.  Objective: Vital Signs: LMP 06/16/2019   Physical Exam:  Constitutional: Patient appears well-developed HEENT:  Head: Normocephalic Eyes:EOM are normal Neck: Normal range of motion Cardiovascular: Normal rate Pulmonary/chest: Effort normal Neurologic: Patient is alert Skin: Skin is warm Psychiatric: Patient has normal mood and affect  Ortho Exam: Ortho exam demonstrates symmetric rotator cuff strength infraspinatus supraspinatus and subscap muscle testing is 5 out of 5.  No coarseness or grinding with internal/external rotation of the left or right shoulder.  No discrete AC joint tenderness is present.  Pretty  reasonable cervical spine range of motion flexion chin to chest extension 40 degrees rotation about 50 degrees bilaterally.  Right shoulder range of motion 75/120/180 left shoulder range of motion 50/86/100 with pain.  Specialty Comments:  No specialty comments available.  Imaging: No results found.   PMFS History: Patient Active Problem List   Diagnosis Date Noted   Coronary artery disease involving native coronary artery of native heart without angina pectoris    Pleuritic pain 07/25/2022   History of non-ST elevation myocardial infarction (NSTEMI)    History of coronary angioplasty  with insertion of stent    Mixed hyperlipidemia    Post PTCA 05/26/2022   Chronic iron  deficiency anemia 03/10/2022   Chronic diastolic CHF (congestive heart failure) (HCC) 03/10/2022   Non-insulin  dependent type 2 diabetes mellitus (HCC) 03/10/2022   Morbidly obese (HCC) 08/19/2020   Uncontrolled type 2 diabetes mellitus with hyperglycemia (HCC) 08/19/2020   Unstable angina (HCC) 11/16/2019   Non-ST elevation (NSTEMI) myocardial infarction (HCC) 10/27/2017   OSA (obstructive sleep apnea) 02/21/2013   Atherosclerosis of native coronary artery of native heart with stable angina pectoris (HCC) 11/18/2011   Dyslipidemia 11/18/2011   Precordial pain 11/17/2011   Abnormal cardiovascular stress test 11/17/2011   DVT (deep venous thrombosis) (HCC) 11/01/2011   Essential hypertension 11/01/2011   Blood loss anemia 11/01/2011   Exertional dyspnea 08/19/2011   Past Medical History:  Diagnosis Date   Anemia    CAD (coronary artery disease)    PCI with stent to RCA in 2019   CHF (congestive heart failure) (HCC) 04/2011   EF 15-20% from echo 05/19/11   DOE (dyspnea on exertion)    DVT (deep venous thrombosis) (HCC) 06/2011   LLE   Fatigue    HTN (hypertension)    Hyperlipidemia    Menorrhagia    with iron  deficient anemia   NSTEMI (non-ST elevated myocardial infarction) (HCC) 10/26/2017   NSTEMI (non-ST elevated myocardial infarction) (HCC) 10/27/2017   Obesity    Orthopnea    Type II diabetes mellitus (HCC)     Family History  Problem Relation Age of Onset   Hypertension Mother    Stroke Father    Heart attack Sister    Stroke Sister     Past Surgical History:  Procedure Laterality Date   CORONARY ANGIOPLASTY WITH STENT PLACEMENT  2013;  10/27/2017   CORONARY BALLOON ANGIOPLASTY N/A 05/26/2022   Procedure: CORONARY BALLOON ANGIOPLASTY;  Surgeon: Elmira Newman PARAS, MD;  [facility]: [facility];  Service: Cardiovascular;  Laterality: N/A;   CORONARY STENT INTERVENTION N/A  10/27/2017   Procedure: CORONARY STENT INTERVENTION;  Surgeon: Elmira Newman PARAS, MD;  Location: [facility];  Service: Cardiovascular;  Laterality: N/A;   CORONARY STENT INTERVENTION N/A 03/10/2022   Procedure: CORONARY STENT INTERVENTION;  Surgeon: Ladona Heinz, MD;  Location: [facility];  Service: Cardiovascular;  Laterality: N/A;   CORONARY STENT INTERVENTION N/A 03/11/2022   Procedure: CORONARY STENT INTERVENTION;  Surgeon: Elmira Newman PARAS, MD;  [facility]: [facility];  Service: Cardiovascular;  Laterality: N/A;  aborted   CORONARY STENT INTERVENTION N/A 10/20/2022   Procedure: CORONARY STENT INTERVENTION;  Surgeon: Elmira Newman PARAS, MD;  [facility];  Service: Cardiovascular;  Laterality: N/A;   CORONARY ULTRASOUND/IVUS N/A 05/26/2022   Procedure: Intravascular Ultrasound/IVUS;  Surgeon: Elmira Newman PARAS, MD;  [facility];  Service: Cardiovascular;  Laterality: N/A;   CORONARY ULTRASOUND/IVUS N/A 10/20/2022   Procedure:  Intravascular Ultrasound/IVUS;  Surgeon: Elmira Newman PARAS, MD;  [facility];  Service: Cardiovascular;  Laterality: N/A;   LEFT HEART CATH AND CORONARY ANGIOGRAPHY N/A 10/27/2017   Procedure: LEFT HEART CATH AND CORONARY ANGIOGRAPHY;  Surgeon: Elmira Newman PARAS, MD;  [facility];  Service: Cardiovascular;  Laterality: N/A;   LEFT HEART CATH AND CORONARY ANGIOGRAPHY N/A 11/17/2019   Procedure: LEFT HEART CATH AND CORONARY ANGIOGRAPHY;  Surgeon: Elmira Newman PARAS, MD;  [facility];  Service: Cardiovascular;  Laterality: N/A;   LEFT HEART CATH AND CORONARY ANGIOGRAPHY N/A 03/10/2022   Procedure: LEFT HEART CATH AND CORONARY ANGIOGRAPHY;  Surgeon: Ladona Heinz, MD;  [facility];  Service: Cardiovascular;  Laterality: N/A;   LEFT HEART CATH AND CORONARY ANGIOGRAPHY N/A 07/27/2022   Procedure: LEFT HEART CATH AND CORONARY ANGIOGRAPHY;  Surgeon: Ladona Heinz, MD;  [facility];  Service: Cardiovascular;  Laterality: N/A;   LEFT HEART CATH AND CORONARY ANGIOGRAPHY N/A 10/20/2022   Procedure: LEFT HEART CATH AND CORONARY ANGIOGRAPHY;  Surgeon: Elmira Newman PARAS, MD;  [facility];  Service: Cardiovascular;  Laterality: N/A;   LEFT HEART CATHETERIZATION WITH CORONARY ANGIOGRAM N/A 11/17/2011   Procedure: LEFT HEART CATHETERIZATION WITH CORONARY ANGIOGRAM;  Surgeon: Erick JONELLE Ladona, MD;  Location: [facility];  Service: Cardiovascular;  Laterality: N/A;   OVARIAN CYST REMOVAL     TUBAL LIGATION  2006   ULTRASOUND GUIDANCE FOR VASCULAR ACCESS  10/27/2017   Procedure: Ultrasound Guidance For Vascular Access;  Surgeon: Elmira Newman PARAS, MD;  [facility];  Service: Cardiovascular;;   Social History   Occupational History   Occupation: unemployed  Tobacco Use   Smoking status: Former    Current packs/day: 0.00    Average packs/day: 0.5 packs/day for 10.0 years (5.0 ttl pk-yrs)    Types: Cigarettes    Start date: 10/20/1983    Quit date: 10/19/1993    Years since quitting: 30.6   Smokeless tobacco: Never  Vaping Use   Vaping status: Never Used  Substance and Sexual Activity   Alcohol use: No   Drug use: No   Sexual activity: Not Currently

## 2024-06-10 ENCOUNTER — Encounter: Payer: Self-pay | Admitting: Orthopedic Surgery

## 2024-06-12 ENCOUNTER — Ambulatory Visit: Attending: Neurology

## 2024-06-12 DIAGNOSIS — M542 Cervicalgia: Secondary | ICD-10-CM | POA: Insufficient documentation

## 2024-06-12 DIAGNOSIS — M25512 Pain in left shoulder: Secondary | ICD-10-CM | POA: Diagnosis present

## 2024-06-12 DIAGNOSIS — M79602 Pain in left arm: Secondary | ICD-10-CM | POA: Insufficient documentation

## 2024-06-12 DIAGNOSIS — M6281 Muscle weakness (generalized): Secondary | ICD-10-CM | POA: Insufficient documentation

## 2024-06-12 NOTE — Therapy (Signed)
 OUTPATIENT PHYSICAL THERAPY TREATMENT NOTE   Patient Name: [name] MRN: [id number] DOB:1966/02/08, 58 y.o., female Today's Date: 06/12/2024  END OF SESSION:  PT End of Session - 06/12/24 1232     Visit Number 9    Number of Visits 18    Date for PT Re-Evaluation 07/26/24    Authorization Type [employer] medicaid  $4 copay  Approved 11 PT visits 04/19/2024 - 07/17/2024; Approved 6 visits 05/17/24-07/15/24 (Order#[id number])    PT Start Time 1145    PT Stop Time 1230    PT Time Calculation (min) 45 min    Activity Tolerance Patient tolerated treatment well    Behavior During Therapy [name] for tasks assessed/performed                Past Medical History:  Diagnosis Date   Anemia    CAD (coronary artery disease)    PCI with stent to RCA in 2019   CHF (congestive heart failure) (HCC) 04/2011   EF 15-20% from echo 05/19/11   DOE (dyspnea on exertion)    DVT (deep venous thrombosis) (HCC) 06/2011   LLE   Fatigue    HTN (hypertension)    Hyperlipidemia    Menorrhagia    with iron  deficient anemia   NSTEMI (non-ST elevated myocardial infarction) (HCC) 10/26/2017   NSTEMI (non-ST elevated myocardial infarction) (HCC) 10/27/2017   Obesity    Orthopnea    Type II diabetes mellitus (HCC)    Past Surgical History:  Procedure Laterality Date   CORONARY ANGIOPLASTY WITH STENT PLACEMENT  2013;  10/27/2017   CORONARY BALLOON ANGIOPLASTY N/A 05/26/2022   Procedure: CORONARY BALLOON ANGIOPLASTY;  Surgeon: Elmira Newman PARAS, MD;  [facility];  Service: Cardiovascular;  Laterality: N/A;   CORONARY STENT INTERVENTION N/A 10/27/2017   Procedure: CORONARY STENT INTERVENTION;  Surgeon: Elmira Newman PARAS, MD;  [facility];  Service: Cardiovascular;  Laterality: N/A;   CORONARY STENT INTERVENTION N/A 03/10/2022   Procedure: CORONARY STENT INTERVENTION;  Surgeon: Ladona Heinz, MD;  [facility];  Service: Cardiovascular;  Laterality:  N/A;   CORONARY STENT INTERVENTION N/A 03/11/2022   Procedure: CORONARY STENT INTERVENTION;  Surgeon: Elmira Newman PARAS, MD;  [facility];  Service: Cardiovascular;  Laterality: N/A;  aborted   CORONARY STENT INTERVENTION N/A 10/20/2022   Procedure: CORONARY STENT INTERVENTION;  Surgeon: Elmira Newman PARAS, MD;  [facility];  Service: Cardiovascular;  Laterality: N/A;   CORONARY ULTRASOUND/IVUS N/A 05/26/2022   Procedure: Intravascular Ultrasound/IVUS;  Surgeon: Elmira Newman PARAS, MD;  [facility];  Service: Cardiovascular;  Laterality: N/A;   CORONARY ULTRASOUND/IVUS N/A 10/20/2022   Procedure: Intravascular Ultrasound/IVUS;  Surgeon: Elmira Newman PARAS, MD;  [facility];  Service: Cardiovascular;  Laterality: N/A;   LEFT HEART CATH AND CORONARY ANGIOGRAPHY N/A 10/27/2017   Procedure: LEFT HEART CATH AND CORONARY ANGIOGRAPHY;  Surgeon: Elmira Newman PARAS, MD;  [facility];  Service: Cardiovascular;  Laterality: N/A;   LEFT HEART CATH AND CORONARY ANGIOGRAPHY N/A 11/17/2019   Procedure: LEFT HEART CATH AND CORONARY ANGIOGRAPHY;  Surgeon: Elmira Newman PARAS, MD;  [facility];  Service: Cardiovascular;  Laterality: N/A;   LEFT HEART CATH AND CORONARY ANGIOGRAPHY N/A 03/10/2022   Procedure: LEFT HEART CATH AND CORONARY ANGIOGRAPHY;  Surgeon: Ladona Heinz, MD;  [facility];  Service: Cardiovascular;  Laterality: N/A;   LEFT  HEART CATH AND CORONARY ANGIOGRAPHY N/A 07/27/2022   Procedure: LEFT HEART CATH AND CORONARY ANGIOGRAPHY;  Surgeon: Ladona Heinz, MD;  [facility];  Service: Cardiovascular;  Laterality: N/A;   LEFT HEART CATH AND CORONARY ANGIOGRAPHY N/A 10/20/2022   Procedure: LEFT HEART CATH AND CORONARY ANGIOGRAPHY;  Surgeon: Elmira Newman PARAS, MD;  [facility];  Service: Cardiovascular;  Laterality: N/A;   LEFT HEART CATHETERIZATION WITH  CORONARY ANGIOGRAM N/A 11/17/2011   Procedure: LEFT HEART CATHETERIZATION WITH CORONARY ANGIOGRAM;  Surgeon: Erick JONELLE Ladona, MD;  Location: [facility];  Service: Cardiovascular;  Laterality: N/A;   OVARIAN CYST REMOVAL     TUBAL LIGATION  2006   ULTRASOUND GUIDANCE FOR VASCULAR ACCESS  10/27/2017   Procedure: Ultrasound Guidance For Vascular Access;  Surgeon: Elmira Newman PARAS, MD;  [facility];  Service: Cardiovascular;;   Patient Active Problem List   Diagnosis Date Noted   Coronary artery disease involving native coronary artery of native heart without angina pectoris    Pleuritic pain 07/25/2022   History of non-ST elevation myocardial infarction (NSTEMI)    History of coronary angioplasty with insertion of stent    Mixed hyperlipidemia    Post PTCA 05/26/2022   Chronic iron  deficiency anemia 03/10/2022   Chronic diastolic CHF (congestive heart failure) (HCC) 03/10/2022   Non-insulin  dependent type 2 diabetes mellitus (HCC) 03/10/2022   Morbidly obese (HCC) 08/19/2020   Uncontrolled type 2 diabetes mellitus with hyperglycemia (HCC) 08/19/2020   Unstable angina (HCC) 11/16/2019   Non-ST elevation (NSTEMI) myocardial infarction (HCC) 10/27/2017   OSA (obstructive sleep apnea) 02/21/2013   Atherosclerosis of native coronary artery of native heart with stable angina pectoris (HCC) 11/18/2011   Dyslipidemia 11/18/2011   Precordial pain 11/17/2011   Abnormal cardiovascular stress test 11/17/2011   DVT (deep venous thrombosis) (HCC) 11/01/2011   Essential hypertension 11/01/2011   Blood loss anemia 11/01/2011   Exertional dyspnea 08/19/2011    PCP: Schuyler Lesser, PA-C  REFERRING PROVIDER: Beatriz Quintela, Donika K, DO  REFERRING DIAG: R20.0 (ICD-10-CM) - Left arm numbness  THERAPY DIAG:  Pain in left arm  Muscle weakness (generalized)  Acute pain of left shoulder  Neck pain  Rationale for Evaluation and Treatment: Rehabilitation  ONSET DATE: 04/10/2024- date  of referral  SUBJECTIVE:                                                                                                                                                                                                         SUBJECTIVE STATEMENT:  Pt reports she had EMG done and went to see ortho MD. Awaiting shoulder and neck MRI. Patient currently reporting 3/10 pain. Hand dominance: Right  PERTINENT HISTORY:  CHF, CAD, DM  PAIN:  Are you having pain? Yes: NPRS scale: 3/10 Pain location: L side of neck, L arm Pain description: radiating, heavy, tingling Aggravating factors: unsure Relieving factors: unsure  PRECAUTIONS: None  RED FLAGS: None     WEIGHT BEARING RESTRICTIONS: No  FALLS:  Has patient fallen in last 6 months? No  PLOF: Independent  PATIENT GOALS: improve pain    OBJECTIVE:  Note: Objective measures were completed at Evaluation unless otherwise noted.  DIAGNOSTIC FINDINGS:  MRI of Brain: 02/17/24   IMPRESSION: 1. No acute intracranial abnormality. 2. Moderate cerebral white matter disease, nonspecific, but most likely related chronic microvascular ischemic disease. Probable tiny remote left cerebellar infarct.  PATIENT SURVEYS:   COGNITION: Overall cognitive status: Within functional limits for tasks assessed  PALPATION: GH capsule hypomobility grossly   CERVICAL ROM:   Active ROM A/PROM (deg) eval AROM 06/12/24  Flexion 75   Extension 70   Right lateral flexion    Left lateral flexion    Right rotation 75 90  Left rotation 80 60   (Blank rows = not tested)  UPPER EXTREMITY ROM:  Active/Passive ROM Right eval Left eval Left 05/17/24  Shoulder flexion 180/180 120/120 135/140  Shoulder abduction 180/180 90/100 130/130  Shoulder extension     Shoulder adduction     Shoulder extension     Shoulder internal rotation T6 T12   Shoulder external rotation T2 L occiput   Elbow flexion     Elbow extension     Wrist flexion     Wrist  extension     Wrist ulnar deviation     Wrist radial deviation     Wrist pronation     Wrist supination      (Blank rows = not tested)  UPPER EXTREMITY MMT:  MMT Right eval Left eval Right 05/17/24 Left 05/17/24  Shoulder flexion 5/5 3+/5    Shoulder extension      Shoulder abduction 5/5 3+/5    Shoulder adduction      Shoulder extension      Shoulder internal rotation 5/5 3+/5    Shoulder external rotation 5/5 3+/5    Middle trapezius      Lower trapezius      Elbow flexion 5/5 3+/5    Elbow extension 5/5 3+/5    Wrist flexion      Wrist extension      Wrist ulnar deviation      Wrist radial deviation      Wrist pronation      Wrist supination      Grip strength 69 25 71 28   (Blank rows = not tested)   Extreme difficulty/unable (0), Quite a bit of difficulty (1), Moderate difficulty (2), Little difficulty (3), No difficulty (4) Survey date:  05/17/24  Any of your usual work, household or school activities 1  2. Your usual hobbies, recreational/sport activities 0   3. Lifting a bag of groceries to waist level 1   4. Lifting a bag of groceries above your head 0  5. Grooming your hair 2  6. Pushing up on your hands (I.e. from bathtub or chair) 3  7. Preparing food (I.e. peeling/cutting) 0  8. Driving  0  9. Vacuuming, sweeping, or raking 1  10. Dressing  4  11. Doing up  buttons 4  12. Using tools/appliances 1  13. Opening doors 3  14. Cleaning  0  15. Tying or lacing shoes 3  16. Sleeping  2  17. Laundering clothes (I.e. washing, ironing, folding) 0  18. Opening a jar 0  19. Throwing a ball 2  20. Carrying a small suitcase with your affected limb.  0  Score total:  27/80    Cervical   TREATMENT DATE:   Soft tissue massage to bil cervical paraspinalis, upper trap area Grade III lateral glides in upper C-spine from R to L PROM of cervical spine in L rotation  Chronic pain education: patient educated on managing her pain <5/10 by taking adequare rest  breaks and modifying activity leverls  Ring board: 2 levels: moved 10 Rings from L to R then back to L (total 20x)- lifting hand to neck level. Added another level to ring bar so pt had to lift hand above the head in seated position: moved the ring: 10x from L to R  Pt educated to call radiology to schedule MRI appts before she sees Dr. Addie again (07/05/24). Gave patient radiology dept phone number        PATIENT EDUCATION:  Education details: see above Person educated: Patient Education method: Explanation Education comprehension: verbalized understanding  HOME EXERCISE PROGRAM: 04/14/24: Standing wall slides into scaption: 10x Access Code: [email or web address] URL: [email or web address] Date: 04/25/2024 Prepared by: Raj Blanch  Exercises - Shoulder Flexion Wall Slide with Towel  - 1-2 x daily - 7 x weekly - 2 sets - 10 reps - Supine Shoulder Press with Dowel  - 1-2 x daily - 7 x weekly - 2 sets - 10 reps - Supine Shoulder Flexion with Dowel  - 1-2 x daily - 7 x weekly - 2 sets - 10 reps  Access Code: [email or web address] URL: [email or web address] Date: 05/03/2024 Prepared by: Raj Blanch  Exercises - Seated Bilateral Shoulder Flexion Towel Slide at Table Top  - 1 x daily - 7 x weekly - 2 sets - 10 reps - Standing Shoulder Row with Anchored Resistance  - 1 x daily - 7 x weekly - 2 sets - 10 reps - Seated Scapular Retraction  - 1 x daily - 7 x weekly - 2 sets - 10 reps - Split Stance Single Arm Chest Press with Resistance  - 1 x daily - 7 x weekly - 2 sets - 10 reps - Standing Bent Over Single Arm Shoulder Row  - 1 x daily - 7 x weekly - 2 sets - 10 reps  ASSESSMENT:  CLINICAL IMPRESSION: Limited cervical AROM noted to left compared to R rotation today. Patient has allodynia with light touch in left cervical paraspinalis and L upper trap region compared to R side. Pt tolerated manual therapy better compared to previous sessions. Cervical AROM improved to 70 deg  after manual therapy. Pt able to raise arm above her head today for first time with exercises. Pt needs frequent pain management education to assure she is not pushing through high levels of pain when performing exercises/activities at home to improve pain management.   OBJECTIVE IMPAIRMENTS: decreased ROM, decreased strength, hypomobility, increased fascial restrictions, increased muscle spasms, impaired flexibility, impaired UE functional use, postural dysfunction, and pain.   ACTIVITY LIMITATIONS: carrying and sleeping  PARTICIPATION LIMITATIONS: meal prep, cleaning, laundry, driving, shopping, and community activity  PERSONAL FACTORS: Age and Time since onset of injury/illness/exacerbation are also affecting patient's functional outcome.   REHAB POTENTIAL: Good  CLINICAL DECISION MAKING:  Stable/uncomplicated  EVALUATION COMPLEXITY: Moderate   GOALS: Goals reviewed with patient? Yes  SHORT TERM GOALS: No STG established due to POC <4 weeks   LONG TERM GOALS: Target date: 07/26/2024   Patient will be 100% compliant with HEP to self manage symptoms at home  Baseline: initiated 04/14/24 Goal status: progressing  2.  Patient will demo at least 10% improvement on UEFS to improve overall function. Baseline: TBD; 27/80 (05/17/24) Goal status: progresing  3.  Patient will demo grip strength in L UE to be >50 lbs to improve ability to hold and carry objects in L hand Baseline: 29 lbs (04/14/24); 28 lbs (05/17/24) Goal status: progressing  4.  Patient will demo L shoulder AROM to be above 160 deg to improve ability to reach in overhead cabinets. Baseline: 120 deg (eval); 135 deg (05/17/24) Goal status: progressing  5.  Pt will report overall max pain of <5/10 in L shoulder and L arm to improve function with L arm Baseline: 10/10 (04/14/24); 6-8/10 at worst (05/17/24) Goal status: progressing   PLAN:  PT FREQUENCY: 2x/week  PT DURATION: 10 weeks  PLANNED INTERVENTIONS: [id number]- PT  Re-evaluation, [id number]- Physical Performance Testing, 97110-Therapeutic exercises, 97530- Therapeutic activity, 97112- Neuromuscular re-education, 97535- Self Care, [id number]- Manual therapy, [id number]- Electrical stimulation (unattended), Patient/Family education, Joint mobilization, Joint manipulation, Spinal manipulation, Spinal mobilization, DME instructions, Cryotherapy, and Moist heat  PLAN FOR NEXT SESSION: Adhesive capsulitis, progress HEP   Raj LOISE Blanch, PT 06/12/2024, 12:32 PM

## 2024-06-20 ENCOUNTER — Encounter: Payer: Self-pay | Admitting: Orthopedic Surgery

## 2024-06-20 ENCOUNTER — Ambulatory Visit: Attending: Neurology

## 2024-06-20 DIAGNOSIS — M542 Cervicalgia: Secondary | ICD-10-CM | POA: Diagnosis present

## 2024-06-20 DIAGNOSIS — M79602 Pain in left arm: Secondary | ICD-10-CM | POA: Diagnosis present

## 2024-06-20 DIAGNOSIS — M6281 Muscle weakness (generalized): Secondary | ICD-10-CM | POA: Insufficient documentation

## 2024-06-20 DIAGNOSIS — M25512 Pain in left shoulder: Secondary | ICD-10-CM | POA: Diagnosis present

## 2024-06-20 NOTE — Therapy (Signed)
 OUTPATIENT PHYSICAL THERAPY TREATMENT NOTE   Patient Name: [name] MRN: [id number] DOB:[birth year], 58 y.o., female Today's Date: 06/20/2024  END OF SESSION:  PT End of Session - 06/20/24 1145     Visit Number 10    Number of Visits 18    Date for PT Re-Evaluation 07/26/24    Authorization Type [employer] [place] medicaid  $4 copay  Approved 11 PT visits 04/19/2024 - 07/17/2024; Approved 6 visits 05/17/24-07/15/24 (Order#[id number])    Activity Tolerance Patient tolerated treatment well    Behavior During Therapy [facility] for tasks assessed/performed                Past Medical History:  Diagnosis Date   Anemia    CAD (coronary artery disease)    PCI with stent to RCA in 2019   CHF (congestive heart failure) (HCC) 04/2011   EF 15-20% from echo 05/19/11   DOE (dyspnea on exertion)    DVT (deep venous thrombosis) (HCC) 06/2011   LLE   Fatigue    HTN (hypertension)    Hyperlipidemia    Menorrhagia    with iron  deficient anemia   NSTEMI (non-ST elevated myocardial infarction) (HCC) 10/26/2017   NSTEMI (non-ST elevated myocardial infarction) (HCC) 10/27/2017   Obesity    Orthopnea    Type II diabetes mellitus (HCC)    Past Surgical History:  Procedure Laterality Date   CORONARY ANGIOPLASTY WITH STENT PLACEMENT  2013;  10/27/2017   CORONARY BALLOON ANGIOPLASTY N/A 05/26/2022   Procedure: CORONARY BALLOON ANGIOPLASTY;  Surgeon: Elmira Newman PARAS, MD;  [facility];  Service: Cardiovascular;  Laterality: N/A;   CORONARY STENT INTERVENTION N/A 10/27/2017   Procedure: CORONARY STENT INTERVENTION;  Surgeon: Elmira Newman PARAS, MD;  [facility];  Service: Cardiovascular;  Laterality: N/A;   CORONARY STENT INTERVENTION N/A 03/10/2022   Procedure: CORONARY STENT INTERVENTION;  Surgeon: Ladona Heinz, MD;  [facility];  Service: Cardiovascular;  Laterality: N/A;   CORONARY STENT INTERVENTION N/A 03/11/2022   Procedure: CORONARY STENT  INTERVENTION;  Surgeon: Elmira Newman PARAS, MD;  [facility];  Service: Cardiovascular;  Laterality: N/A;  aborted   CORONARY STENT INTERVENTION N/A 10/20/2022   Procedure: CORONARY STENT INTERVENTION;  Surgeon: Elmira Newman PARAS, MD;  [facility];  Service: Cardiovascular;  Laterality: N/A;   CORONARY ULTRASOUND/IVUS N/A 05/26/2022   Procedure: Intravascular Ultrasound/IVUS;  Surgeon: Elmira Newman PARAS, MD;  [facility];  Service: Cardiovascular;  Laterality: N/A;   CORONARY ULTRASOUND/IVUS N/A 10/20/2022   Procedure: Intravascular Ultrasound/IVUS;  Surgeon: Elmira Newman PARAS, MD;  [facility];  Service: Cardiovascular;  Laterality: N/A;   LEFT HEART CATH AND CORONARY ANGIOGRAPHY N/A 10/27/2017   Procedure: LEFT HEART CATH AND CORONARY ANGIOGRAPHY;  Surgeon: Elmira Newman PARAS, MD;  [facility];  Service: Cardiovascular;  Laterality: N/A;   LEFT HEART CATH AND CORONARY ANGIOGRAPHY N/A 11/17/2019   Procedure: LEFT HEART CATH AND CORONARY ANGIOGRAPHY;  Surgeon: Elmira Newman PARAS, MD;  [facility];  Service: Cardiovascular;  Laterality: N/A;   LEFT HEART CATH AND CORONARY ANGIOGRAPHY N/A 03/10/2022   Procedure: LEFT HEART CATH AND CORONARY ANGIOGRAPHY;  Surgeon: Ladona Heinz, MD;  [facility];  Service: Cardiovascular;  Laterality: N/A;   LEFT HEART CATH AND CORONARY ANGIOGRAPHY N/A 07/27/2022   Procedure: LEFT HEART CATH AND CORONARY ANGIOGRAPHY;  Surgeon: Ladona Heinz, MD;  [facility];  Service: Cardiovascular;  Laterality: N/A;   LEFT HEART CATH AND CORONARY ANGIOGRAPHY N/A 10/20/2022   Procedure: LEFT HEART CATH AND CORONARY ANGIOGRAPHY;  Surgeon: Elmira Newman PARAS, MD;  [facility];  Service: Cardiovascular;  Laterality: N/A;   LEFT HEART CATHETERIZATION WITH CORONARY ANGIOGRAM N/A 11/17/2011   Procedure: LEFT HEART CATHETERIZATION WITH  CORONARY ANGIOGRAM;  Surgeon: Erick JONELLE Bergamo, MD;  Location: [facility] CATH LAB;  Service: Cardiovascular;  Laterality: N/A;   OVARIAN CYST REMOVAL     TUBAL LIGATION  2006   ULTRASOUND GUIDANCE FOR VASCULAR ACCESS  10/27/2017   Procedure: Ultrasound Guidance For Vascular Access;  Surgeon: Elmira Newman PARAS, MD;  [facility];  Service: Cardiovascular;;   Patient Active Problem List   Diagnosis Date Noted   Coronary artery disease involving native coronary artery of native heart without angina pectoris    Pleuritic pain 07/25/2022   History of non-ST elevation myocardial infarction (NSTEMI)    History of coronary angioplasty with insertion of stent    Mixed hyperlipidemia    Post PTCA 05/26/2022   Chronic iron  deficiency anemia 03/10/2022   Chronic diastolic CHF (congestive heart failure) (HCC) 03/10/2022   Non-insulin  dependent type 2 diabetes mellitus (HCC) 03/10/2022   Morbidly obese (HCC) 08/19/2020   Uncontrolled type 2 diabetes mellitus with hyperglycemia (HCC) 08/19/2020   Unstable angina (HCC) 11/16/2019   Non-ST elevation (NSTEMI) myocardial infarction (HCC) 10/27/2017   OSA (obstructive sleep apnea) 02/21/2013   Atherosclerosis of native coronary artery of native heart with stable angina pectoris (HCC) 11/18/2011   Dyslipidemia 11/18/2011   Precordial pain 11/17/2011   Abnormal cardiovascular stress test 11/17/2011   DVT (deep venous thrombosis) (HCC) 11/01/2011   Essential hypertension 11/01/2011   Blood loss anemia 11/01/2011   Exertional dyspnea 08/19/2011    PCP: Schuyler Lesser, PA-C  REFERRING PROVIDER: Niesha Bame, Donika K, DO  REFERRING DIAG: R20.0 (ICD-10-CM) - Left arm numbness  THERAPY DIAG:  Pain in left arm  Muscle weakness (generalized)  Acute pain of left shoulder  Neck pain  Rationale for Evaluation and Treatment: Rehabilitation  ONSET DATE: 04/10/2024- date of referral  SUBJECTIVE:                                                                                                                                                                                                          SUBJECTIVE STATEMENT: Pt reports she was sore after massage for hours after her last session. Pt reports overall, no significant difference in pain since last  session. Exercises are still painful at home. Shoulder and neck MRI are scheduled next week. Next MD appt on 9/17 Hand dominance: Right  PERTINENT HISTORY:  CHF, CAD, DM  PAIN:  Are you having pain? Yes: NPRS scale: 6/10 Pain location: L side of neck, L arm Pain description: radiating, heavy, tingling Aggravating factors: unsure Relieving factors: unsure  PRECAUTIONS: None  RED FLAGS: None     WEIGHT BEARING RESTRICTIONS: No  FALLS:  Has patient fallen in last 6 months? No  PLOF: Independent  PATIENT GOALS: improve pain    OBJECTIVE:  Note: Objective measures were completed at Evaluation unless otherwise noted.  DIAGNOSTIC FINDINGS:  MRI of Brain: 02/17/24   IMPRESSION: 1. No acute intracranial abnormality. 2. Moderate cerebral white matter disease, nonspecific, but most likely related chronic microvascular ischemic disease. Probable tiny remote left cerebellar infarct.  PATIENT SURVEYS:   COGNITION: Overall cognitive status: Within functional limits for tasks assessed  PALPATION: GH capsule hypomobility grossly   CERVICAL ROM:   Active ROM A/PROM (deg) eval AROM 06/12/24  Flexion 75   Extension 70   Right lateral flexion    Left lateral flexion    Right rotation 75 90  Left rotation 80 60   (Blank rows = not tested)  UPPER EXTREMITY ROM:  Active/Passive ROM Right eval Left eval Left 05/17/24 Left 06/20/24  Shoulder flexion 180/180 120/120 135/140 85 pain (AROM)  Shoulder abduction 180/180 90/100 130/130   Shoulder extension      Shoulder adduction      Shoulder extension      Shoulder internal rotation T6 T12    Shoulder external rotation T2  L occiput    Elbow flexion      Elbow extension      Wrist flexion      Wrist extension      Wrist ulnar deviation      Wrist radial deviation      Wrist pronation      Wrist supination       (Blank rows = not tested)  UPPER EXTREMITY MMT:  MMT Right eval Left eval Right 05/17/24 Left 05/17/24 Left 06/20/24 Right 06/20/24  Shoulder flexion 5/5 3+/5      Shoulder extension        Shoulder abduction 5/5 3+/5      Shoulder adduction        Shoulder extension        Shoulder internal rotation 5/5 3+/5      Shoulder external rotation 5/5 3+/5      Middle trapezius        Lower trapezius        Elbow flexion 5/5 3+/5      Elbow extension 5/5 3+/5      Wrist flexion        Wrist extension        Wrist ulnar deviation        Wrist radial deviation        Wrist pronation        Wrist supination        Grip strength 69 25 71 28 14.9 lb 54 lbs   (Blank rows = not tested)   Extreme difficulty/unable (0), Quite a bit of difficulty (1), Moderate difficulty (2), Little difficulty (3), No difficulty (4) Survey date:  05/17/24  Any of your usual work, household or school activities 1  2. Your usual hobbies, recreational/sport activities 0   3. Lifting a bag of groceries to waist level 1  4. Lifting a bag of groceries above your head 0  5. Grooming your hair 2  6. Pushing up on your hands (I.e. from bathtub or chair) 3  7. Preparing food (I.e. peeling/cutting) 0  8. Driving  0  9. Vacuuming, sweeping, or raking 1  10. Dressing  4  11. Doing up buttons 4  12. Using tools/appliances 1  13. Opening doors 3  14. Cleaning  0  15. Tying or lacing shoes 3  16. Sleeping  2  17. Laundering clothes (I.e. washing, ironing, folding) 0  18. Opening a jar 0  19. Throwing a ball 2  20. Carrying a small suitcase with your affected limb.  0  Score total:  27/80    Cervical   TREATMENT DATE:  ROM and grip strength assessed again. We discussed that AROM and strength are significantly lower  compared to evaluation and reassessment today. Unsure of why exactly and it may be due to her pain but her pain was higher during the evaluation compared to today.  Attempted swiss ball rolls in flexion in seated position, pt it was painful for patient to perform >5x without needing rest, even with cues to not go in the pain.  Due to increased pain/discomfort today, we performed IFC, E-stim, at 13mA for 20' to L shoulder with hot pack in neck and on shoulder. Call bell given to patient.     PATIENT EDUCATION:  Education details: see above Person educated: Patient Education method: Explanation Education comprehension: verbalized understanding  HOME EXERCISE PROGRAM: 04/14/24: Standing wall slides into scaption: 10x Access Code: [email or web address] URL: [email or web address] Date: 04/25/2024 Prepared by: Raj Blanch  Exercises - Shoulder Flexion Wall Slide with Towel  - 1-2 x daily - 7 x weekly - 2 sets - 10 reps - Supine Shoulder Press with Dowel  - 1-2 x daily - 7 x weekly - 2 sets - 10 reps - Supine Shoulder Flexion with Dowel  - 1-2 x daily - 7 x weekly - 2 sets - 10 reps  Access Code: [email or web address] URL: [email or web address] Date: 05/03/2024 Prepared by: Raj Blanch  Exercises - Seated Bilateral Shoulder Flexion Towel Slide at Table Top  - 1 x daily - 7 x weekly - 2 sets - 10 reps - Standing Shoulder Row with Anchored Resistance  - 1 x daily - 7 x weekly - 2 sets - 10 reps - Seated Scapular Retraction  - 1 x daily - 7 x weekly - 2 sets - 10 reps - Split Stance Single Arm Chest Press with Resistance  - 1 x daily - 7 x weekly - 2 sets - 10 reps - Standing Bent Over Single Arm Shoulder Row  - 1 x daily - 7 x weekly - 2 sets - 10 reps  ASSESSMENT:  CLINICAL IMPRESSION: Performed AROM and grip strength assessment. For some odd reasons, patient's AROM was significantly less compared to previous assessments and her grip strength was significantly decreased in L  UE. Patient's next follow up was rescheduled 1-2 day after she follow up with orthopedic MD to figure out next POC and steps.   OBJECTIVE IMPAIRMENTS: decreased ROM, decreased strength, hypomobility, increased fascial restrictions, increased muscle spasms, impaired flexibility, impaired UE functional use, postural dysfunction, and pain.   ACTIVITY LIMITATIONS: carrying and sleeping  PARTICIPATION LIMITATIONS: meal prep, cleaning, laundry, driving, shopping, and community activity  PERSONAL FACTORS: Age and Time since onset of injury/illness/exacerbation are also affecting patient's functional outcome.   REHAB POTENTIAL: Good  CLINICAL DECISION  MAKING: Stable/uncomplicated  EVALUATION COMPLEXITY: Moderate   GOALS: Goals reviewed with patient? Yes  SHORT TERM GOALS: No STG established due to POC <4 weeks   LONG TERM GOALS: Target date: 07/26/2024   Patient will be 100% compliant with HEP to self manage symptoms at home  Baseline: initiated 04/14/24 Goal status: progressing  2.  Patient will demo at least 10% improvement on UEFS to improve overall function. Baseline: TBD; 27/80 (05/17/24) Goal status: progresing  3.  Patient will demo grip strength in L UE to be >50 lbs to improve ability to hold and carry objects in L hand Baseline: 29 lbs (04/14/24); 28 lbs (05/17/24) Goal status: progressing  4.  Patient will demo L shoulder AROM to be above 160 deg to improve ability to reach in overhead cabinets. Baseline: 120 deg (eval); 135 deg (05/17/24) Goal status: progressing  5.  Pt will report overall max pain of <5/10 in L shoulder and L arm to improve function with L arm Baseline: 10/10 (04/14/24); 6-8/10 at worst (05/17/24) Goal status: progressing   PLAN:  PT FREQUENCY: 2x/week  PT DURATION: 10 weeks  PLANNED INTERVENTIONS: 97164- PT Re-evaluation, 97750- Physical Performance Testing, 97110-Therapeutic exercises, 97530- Therapeutic activity, 97112- Neuromuscular  re-education, 97535- Self Care, [id number]- Manual therapy, [id number]- Electrical stimulation (unattended), Patient/Family education, Joint mobilization, Joint manipulation, Spinal manipulation, Spinal mobilization, DME instructions, Cryotherapy, and Moist heat  PLAN FOR NEXT SESSION: Adhesive capsulitis, progress HEP   Raj LOISE Blanch, PT 06/20/2024, 12:29 PM

## 2024-06-26 ENCOUNTER — Ambulatory Visit
Admission: RE | Admit: 2024-06-26 | Discharge: 2024-06-26 | Disposition: A | Discharge status: Home / Self Care | Source: Ambulatory Visit | Attending: Orthopedic Surgery | Admitting: Orthopedic Surgery

## 2024-06-26 DIAGNOSIS — M542 Cervicalgia: Secondary | ICD-10-CM

## 2024-06-28 ENCOUNTER — Inpatient Hospital Stay
Admission: RE | Admit: 2024-06-28 | Discharge: 2024-06-28 | Disposition: A | Discharge status: Home / Self Care | Source: Ambulatory Visit | Attending: Orthopedic Surgery | Admitting: Orthopedic Surgery

## 2024-06-28 ENCOUNTER — Ambulatory Visit
Admission: RE | Admit: 2024-06-28 | Discharge: 2024-06-28 | Disposition: A | Discharge status: Home / Self Care | Source: Ambulatory Visit | Attending: Orthopedic Surgery | Admitting: Orthopedic Surgery

## 2024-06-28 DIAGNOSIS — G8929 Other chronic pain: Secondary | ICD-10-CM

## 2024-06-28 MED ORDER — IOPAMIDOL (ISOVUE-M 200) INJECTION 41%
10.0000 mL | Freq: Once | INTRAMUSCULAR | Status: AC
  Administered 2024-06-28: 10 mL via INTRA_ARTICULAR

## 2024-07-05 ENCOUNTER — Other Ambulatory Visit: Payer: Self-pay

## 2024-07-05 ENCOUNTER — Ambulatory Visit

## 2024-07-05 ENCOUNTER — Encounter: Payer: Self-pay | Admitting: Orthopedic Surgery

## 2024-07-05 ENCOUNTER — Ambulatory Visit: Admitting: Orthopedic Surgery

## 2024-07-05 DIAGNOSIS — M25512 Pain in left shoulder: Secondary | ICD-10-CM | POA: Diagnosis not present

## 2024-07-05 DIAGNOSIS — M542 Cervicalgia: Secondary | ICD-10-CM | POA: Diagnosis not present

## 2024-07-05 DIAGNOSIS — M7502 Adhesive capsulitis of left shoulder: Secondary | ICD-10-CM | POA: Diagnosis not present

## 2024-07-05 DIAGNOSIS — G8929 Other chronic pain: Secondary | ICD-10-CM | POA: Diagnosis not present

## 2024-07-05 NOTE — Progress Notes (Unsigned)
 Office Visit Note   Patient: TANYSHA QUANT           Date of Birth: Dec 13, 1965           MRN: [id number] Visit Date: 07/05/2024 Requested by: Linde Bitters, PA-C [address] [place],  [place] [address] PCP: Linde Bitters, PA-C  Subjective: Chief Complaint  Patient presents with   Other    Review left shoulder MRI and cervical spine MRI    HPI: KAMILYA WAKEMAN is a 58 y.o. female who presents to the office reporting continued neck pain and left shoulder pain.  Since she was last seen she has had an MRI scan of the left shoulder as well as the cervical spine.  Symptoms remain the same.  Does have numbness in the left hand.  MRI cervical spine is reviewed with the patient.  She does have severe left C5 foraminal stenosis along with other degenerative changes.  Also has severe left C7 foraminal narrowing.  Regarding the left shoulder the patient has moderate AC joint arthrosis as well as possible small perforation full-thickness of the supraspinatus tendon rotator cuff tear..                ROS: All systems reviewed are negative as they relate to the chief complaint within the history of present illness.  Patient denies fevers or chills.  Assessment & Plan: Visit Diagnoses:  1. Neck pain     Plan: Impression is neck greater than shoulder source of symptoms.  She does have some decrease sensation in that left axillary distribution.  The rotator cuff tear is somewhat underwhelming but she does have some limitation of forward flexion.  Axillary recess is a little bit thickened.  I think she may have some adhesive capsulitis but this numbness and debilitating type pain is likely coming from her neck.  Ultrasound-guided injection into the left glenohumeral joint is performed today.  Asked her to really keep track of how her pain relief is for the first several hours after that injection during the anesthetic phase.  Would also like her to get a cervical ESI with Dr. Eldonna with the  same parameters to see how much that relieves her pain so we can figure out where her pain generators are.  Come back to see me in about 4 weeks.  Follow-Up Instructions: No follow-ups on file.   Orders:  No orders of the defined types were placed in this encounter.  No orders of the defined types were placed in this encounter.     Procedures: Large Joint Inj: L glenohumeral on 07/05/2024 10:13 AM Indications: diagnostic evaluation and pain Details: 22 G 3.5 in needle, ultrasound-guided posterior approach  Arthrogram: No  Medications: 9 mL bupivacaine  0.5 %; 5 mL lidocaine  1 %; 40 mg triamcinolone  acetonide 40 MG/ML Outcome: tolerated well, no immediate complications Procedure, treatment alternatives, risks and benefits explained, specific risks discussed. Consent was given by the patient. Immediately prior to procedure a time out was called to verify the correct patient, procedure, equipment, support staff and site/side marked as required. Patient was prepped and draped in the usual sterile fashion.       Clinical Data: No additional findings.  Objective: Vital Signs: LMP 06/16/2019   Physical Exam:  Constitutional: Patient appears well-developed HEENT:  Head: Normocephalic Eyes:EOM are normal Neck: Normal range of motion Cardiovascular: Normal rate Pulmonary/chest: Effort normal Neurologic: Patient is alert Skin: Skin is warm Psychiatric: Patient has normal mood and affect  Ortho  Exam: Ortho exam demonstrates range of motion on the right of 70/105/180 on the left at 70/95/145.  Rotator cuff strength is actually pretty good to infraspinatus supraspinatus and subscap muscle testing.  Does have some paresthesias in the axillary distribution on the left compared to the right.  Radial pulse intact bilaterally.  No masses lymphadenopathy or skin changes noted in that shoulder girdle region.  She does have 5 out of 5 grip EPL FPL interosseous wrist flexion extension bicep  triceps and deltoid strength.  Specialty Comments:  No specialty comments available.  Imaging: No results found.   PMFS History: Patient Active Problem List   Diagnosis Date Noted   Coronary artery disease involving native coronary artery of native heart without angina pectoris    Pleuritic pain 07/25/2022   History of non-ST elevation myocardial infarction (NSTEMI)    History of coronary angioplasty with insertion of stent    Mixed hyperlipidemia    Post PTCA 05/26/2022   Chronic iron  deficiency anemia 03/10/2022   Chronic diastolic CHF (congestive heart failure) (HCC) 03/10/2022   Non-insulin  dependent type 2 diabetes mellitus (HCC) 03/10/2022   Morbidly obese (HCC) 08/19/2020   Uncontrolled type 2 diabetes mellitus with hyperglycemia (HCC) 08/19/2020   Unstable angina (HCC) 11/16/2019   Non-ST elevation (NSTEMI) myocardial infarction (HCC) 10/27/2017   OSA (obstructive sleep apnea) 02/21/2013   Atherosclerosis of native coronary artery of native heart with stable angina pectoris (HCC) 11/18/2011   Dyslipidemia 11/18/2011   Precordial pain 11/17/2011   Abnormal cardiovascular stress test 11/17/2011   DVT (deep venous thrombosis) (HCC) 11/01/2011   Essential hypertension 11/01/2011   Blood loss anemia 11/01/2011   Exertional dyspnea 08/19/2011   Past Medical History:  Diagnosis Date   Anemia    CAD (coronary artery disease)    PCI with stent to RCA in 2019   CHF (congestive heart failure) (HCC) 04/2011   EF 15-20% from echo 05/19/11   DOE (dyspnea on exertion)    DVT (deep venous thrombosis) (HCC) 06/2011   LLE   Fatigue    HTN (hypertension)    Hyperlipidemia    Menorrhagia    with iron  deficient anemia   NSTEMI (non-ST elevated myocardial infarction) (HCC) 10/26/2017   NSTEMI (non-ST elevated myocardial infarction) (HCC) 10/27/2017   Obesity    Orthopnea    Type II diabetes mellitus (HCC)     Family History  Problem Relation Age of Onset   Hypertension  Mother    Stroke Father    Heart attack Sister    Stroke Sister     Past Surgical History:  Procedure Laterality Date   CORONARY ANGIOPLASTY WITH STENT PLACEMENT  2013;  10/27/2017   CORONARY BALLOON ANGIOPLASTY N/A 05/26/2022   Procedure: CORONARY BALLOON ANGIOPLASTY;  Surgeon: Elmira Newman PARAS, MD;  [facility];  Service: Cardiovascular;  Laterality: N/A;   CORONARY STENT INTERVENTION N/A 10/27/2017   Procedure: CORONARY STENT INTERVENTION;  Surgeon: Elmira Newman PARAS, MD;  [facility];  Service: Cardiovascular;  Laterality: N/A;   CORONARY STENT INTERVENTION N/A 03/10/2022   Procedure: CORONARY STENT INTERVENTION;  Surgeon: Ladona Heinz, MD;  [facility];  Service: Cardiovascular;  Laterality: N/A;   CORONARY STENT INTERVENTION N/A 03/11/2022   Procedure: CORONARY STENT INTERVENTION;  Surgeon: Elmira Newman PARAS, MD;  [facility];  Service: Cardiovascular;  Laterality: N/A;  aborted   CORONARY STENT INTERVENTION N/A 10/20/2022   Procedure: CORONARY STENT INTERVENTION;  Surgeon: Elmira,  Newman PARAS, MD;  [facility];  Service: Cardiovascular;  Laterality: N/A;   CORONARY ULTRASOUND/IVUS N/A 05/26/2022   Procedure: Intravascular Ultrasound/IVUS;  Surgeon: Elmira Newman PARAS, MD;  [facility];  Service: Cardiovascular;  Laterality: N/A;   CORONARY ULTRASOUND/IVUS N/A 10/20/2022   Procedure: Intravascular Ultrasound/IVUS;  Surgeon: Elmira Newman PARAS, MD;  [facility];  Service: Cardiovascular;  Laterality: N/A;   LEFT HEART CATH AND CORONARY ANGIOGRAPHY N/A 10/27/2017   Procedure: LEFT HEART CATH AND CORONARY ANGIOGRAPHY;  Surgeon: Elmira Newman PARAS, MD;  [facility];  Service: Cardiovascular;  Laterality: N/A;   LEFT HEART CATH AND CORONARY ANGIOGRAPHY N/A 11/17/2019   Procedure: LEFT HEART CATH AND CORONARY ANGIOGRAPHY;  Surgeon: Elmira Newman PARAS, MD;   [facility];  Service: Cardiovascular;  Laterality: N/A;   LEFT HEART CATH AND CORONARY ANGIOGRAPHY N/A 03/10/2022   Procedure: LEFT HEART CATH AND CORONARY ANGIOGRAPHY;  Surgeon: Ladona Heinz, MD;  [facility];  Service: Cardiovascular;  Laterality: N/A;   LEFT HEART CATH AND CORONARY ANGIOGRAPHY N/A 07/27/2022   Procedure: LEFT HEART CATH AND CORONARY ANGIOGRAPHY;  Surgeon: Ladona Heinz, MD;  [facility];  Service: Cardiovascular;  Laterality: N/A;   LEFT HEART CATH AND CORONARY ANGIOGRAPHY N/A 10/20/2022   Procedure: LEFT HEART CATH AND CORONARY ANGIOGRAPHY;  Surgeon: Elmira Newman PARAS, MD;  [facility];  Service: Cardiovascular;  Laterality: N/A;   LEFT HEART CATHETERIZATION WITH CORONARY ANGIOGRAM N/A 11/17/2011   Procedure: LEFT HEART CATHETERIZATION WITH CORONARY ANGIOGRAM;  Surgeon: Erick JONELLE Ladona, MD;  Location: [facility] CATH LAB;  Service: Cardiovascular;  Laterality: N/A;   OVARIAN CYST REMOVAL     TUBAL LIGATION  2006   ULTRASOUND GUIDANCE FOR VASCULAR ACCESS  10/27/2017   Procedure: Ultrasound Guidance For Vascular Access;  Surgeon: Elmira Newman PARAS, MD;  [facility];  Service: Cardiovascular;;   Social History   Occupational History   Occupation: unemployed  Tobacco Use   Smoking status: Former    Current packs/day: 0.00    Average packs/day: 0.5 packs/day for 10.0 years (5.0 ttl pk-yrs)    Types: Cigarettes    Start date: 10/20/1983    Quit date: 10/19/1993    Years since quitting: 30.7   Smokeless tobacco: Never  Vaping Use   Vaping status: Never Used  Substance and Sexual Activity   Alcohol use: No   Drug use: No   Sexual activity: Not Currently

## 2024-07-06 MED ORDER — BUPIVACAINE HCL 0.5 % IJ SOLN
9.0000 mL | INTRAMUSCULAR | Status: AC | PRN
  Administered 2024-07-05: 9 mL via INTRA_ARTICULAR

## 2024-07-06 MED ORDER — LIDOCAINE HCL 1 % IJ SOLN
5.0000 mL | INTRAMUSCULAR | Status: AC | PRN
  Administered 2024-07-05: 5 mL

## 2024-07-06 MED ORDER — TRIAMCINOLONE ACETONIDE 40 MG/ML IJ SUSP
40.0000 mg | INTRAMUSCULAR | Status: AC | PRN
  Administered 2024-07-05: 40 mg via INTRA_ARTICULAR

## 2024-07-07 ENCOUNTER — Ambulatory Visit

## 2024-07-07 DIAGNOSIS — M79602 Pain in left arm: Secondary | ICD-10-CM | POA: Diagnosis not present

## 2024-07-07 DIAGNOSIS — M6281 Muscle weakness (generalized): Secondary | ICD-10-CM

## 2024-07-07 DIAGNOSIS — M542 Cervicalgia: Secondary | ICD-10-CM

## 2024-07-07 DIAGNOSIS — M25512 Pain in left shoulder: Secondary | ICD-10-CM

## 2024-07-07 NOTE — Therapy (Signed)
 OUTPATIENT PHYSICAL THERAPY TREATMENT NOTE   Patient Name: [name] MRN: [id number] DOB:[birth year], 58 y.o., female Today's Date: 07/07/2024  END OF SESSION:  PT End of Session - 07/07/24 1109     Visit Number 11    Number of Visits 18    Date for Recertification  07/26/24    Authorization Type [employer] [place] medicaid  $4 copay  Approved 11 PT visits 04/19/2024 - 07/17/2024; Approved 6 visits 05/17/24-07/15/24 (Order#[id number])    Activity Tolerance Patient tolerated treatment well    Behavior During Therapy [facility] for tasks assessed/performed                 Past Medical History:  Diagnosis Date   Anemia    CAD (coronary artery disease)    PCI with stent to RCA in 2019   CHF (congestive heart failure) (HCC) 04/2011   EF 15-20% from echo 05/19/11   DOE (dyspnea on exertion)    DVT (deep venous thrombosis) (HCC) 06/2011   LLE   Fatigue    HTN (hypertension)    Hyperlipidemia    Menorrhagia    with iron  deficient anemia   NSTEMI (non-ST elevated myocardial infarction) (HCC) 10/26/2017   NSTEMI (non-ST elevated myocardial infarction) (HCC) 10/27/2017   Obesity    Orthopnea    Type II diabetes mellitus (HCC)    Past Surgical History:  Procedure Laterality Date   CORONARY ANGIOPLASTY WITH STENT PLACEMENT  2013;  10/27/2017   CORONARY BALLOON ANGIOPLASTY N/A 05/26/2022   Procedure: CORONARY BALLOON ANGIOPLASTY;  Surgeon: Elmira Newman PARAS, MD;  Location: [facility];  Service: Cardiovascular;  Laterality: N/A;   CORONARY STENT INTERVENTION N/A 10/27/2017   Procedure: CORONARY STENT INTERVENTION;  Surgeon: Elmira Newman PARAS, MD;  Location: [facility];  Service: Cardiovascular;  Laterality: N/A;   CORONARY STENT INTERVENTION N/A 03/10/2022   Procedure: CORONARY STENT INTERVENTION;  Surgeon: Ladona Heinz, MD;  Location: [facility];  Service: Cardiovascular;  Laterality: N/A;   CORONARY STENT INTERVENTION N/A 03/11/2022   Procedure: CORONARY STENT  INTERVENTION;  Surgeon: Elmira Newman PARAS, MD;  Location: [facility];  Service: Cardiovascular;  Laterality: N/A;  aborted   CORONARY STENT INTERVENTION N/A 10/20/2022   Procedure: CORONARY STENT INTERVENTION;  Surgeon: Elmira Newman PARAS, MD;  [facility]: [facility];  Service: Cardiovascular;  Laterality: N/A;   CORONARY ULTRASOUND/IVUS N/A 05/26/2022   Procedure: Intravascular Ultrasound/IVUS;  Surgeon: Elmira Newman PARAS, MD;  [facility]: [facility];  Service: Cardiovascular;  Laterality: N/A;   CORONARY ULTRASOUND/IVUS N/A 10/20/2022   Procedure: Intravascular Ultrasound/IVUS;  Surgeon: Elmira Newman PARAS, MD;  [facility]: [facility];  Service: Cardiovascular;  Laterality: N/A;   LEFT HEART CATH AND CORONARY ANGIOGRAPHY N/A 10/27/2017   Procedure: LEFT HEART CATH AND CORONARY ANGIOGRAPHY;  Surgeon: Elmira Newman PARAS, MD;  [facility]: [facility];  Service: Cardiovascular;  Laterality: N/A;   LEFT HEART CATH AND CORONARY ANGIOGRAPHY N/A 11/17/2019   Procedure: LEFT HEART CATH AND CORONARY ANGIOGRAPHY;  Surgeon: Elmira Newman PARAS, MD;  [facility]: [facility];  Service: Cardiovascular;  Laterality: N/A;   LEFT HEART CATH AND CORONARY ANGIOGRAPHY N/A 03/10/2022   Procedure: LEFT HEART CATH AND CORONARY ANGIOGRAPHY;  Surgeon: Ladona Heinz, MD;  [facility];  Service: Cardiovascular;  Laterality: N/A;   LEFT HEART CATH AND CORONARY ANGIOGRAPHY N/A 07/27/2022   Procedure: LEFT HEART CATH AND CORONARY ANGIOGRAPHY;  Surgeon: Ladona Heinz, MD;  [facility];  Service: Cardiovascular;  Laterality: N/A;   LEFT HEART CATH AND CORONARY ANGIOGRAPHY N/A 10/20/2022   Procedure: LEFT HEART CATH AND CORONARY ANGIOGRAPHY;  Surgeon: Elmira Newman PARAS, MD;  [facility];  Service: Cardiovascular;  Laterality: N/A;   LEFT HEART CATHETERIZATION WITH CORONARY ANGIOGRAM N/A 11/17/2011   Procedure: LEFT HEART CATHETERIZATION WITH  CORONARY ANGIOGRAM;  Surgeon: Erick JONELLE Bergamo, MD;  Location: [facility] CATH [facility];  Service: Cardiovascular;  Laterality: N/A;   OVARIAN CYST REMOVAL     TUBAL LIGATION  2006   ULTRASOUND GUIDANCE FOR VASCULAR ACCESS  10/27/2017   Procedure: Ultrasound Guidance For Vascular Access;  Surgeon: Elmira Newman PARAS, MD;  Location: [facility];  Service: Cardiovascular;;   Patient Active Problem List   Diagnosis Date Noted   Coronary artery disease involving native coronary artery of native heart without angina pectoris    Pleuritic pain 07/25/2022   History of non-ST elevation myocardial infarction (NSTEMI)    History of coronary angioplasty with insertion of stent    Mixed hyperlipidemia    Post PTCA 05/26/2022   Chronic iron  deficiency anemia 03/10/2022   Chronic diastolic CHF (congestive heart failure) (HCC) 03/10/2022   Non-insulin  dependent type 2 diabetes mellitus (HCC) 03/10/2022   Morbidly obese (HCC) 08/19/2020   Uncontrolled type 2 diabetes mellitus with hyperglycemia (HCC) 08/19/2020   Unstable angina (HCC) 11/16/2019   Non-ST elevation (NSTEMI) myocardial infarction (HCC) 10/27/2017   OSA (obstructive sleep apnea) 02/21/2013   Atherosclerosis of native coronary artery of native heart with stable angina pectoris (HCC) 11/18/2011   Dyslipidemia 11/18/2011   Precordial pain 11/17/2011   Abnormal cardiovascular stress test 11/17/2011   DVT (deep venous thrombosis) (HCC) 11/01/2011   Essential hypertension 11/01/2011   Blood loss anemia 11/01/2011   Exertional dyspnea 08/19/2011    PCP: Schuyler Lesser, PA-C  REFERRING PROVIDER: Tobie Tonita POUR, DO  REFERRING DIAG: R20.0 (ICD-10-CM) - Left arm numbness  THERAPY DIAG:  Pain in left arm  Muscle weakness (generalized)  Acute pain of left shoulder  Neck pain  Rationale for Evaluation and Treatment: Rehabilitation  ONSET DATE: 04/10/2024- date of referral  SUBJECTIVE:                                                                                                                                                                                                          SUBJECTIVE STATEMENT: Pt reports she went to see MD and had MRI of shoulder and neck done. She had received the shot in the  shoulder and shoulder feels better.  Hand dominance: Right  PERTINENT HISTORY:  CHF, CAD, DM  PAIN:  Are you having pain? Yes: NPRS scale: 0/10 Pain location: L side of neck, L arm Pain description: radiating, heavy, tingling Aggravating factors: unsure Relieving factors: unsure  PRECAUTIONS: None  RED FLAGS: None     WEIGHT BEARING RESTRICTIONS: No  FALLS:  Has patient fallen in last 6 months? No  PLOF: Independent  PATIENT GOALS: improve pain    OBJECTIVE:  Note: Objective measures were completed at Evaluation unless otherwise noted.  DIAGNOSTIC FINDINGS:  MRI of Brain: 02/17/24   IMPRESSION: 1. No acute intracranial abnormality. 2. Moderate cerebral white matter disease, nonspecific, but most likely related chronic microvascular ischemic disease. Probable tiny remote left cerebellar infarct.  PATIENT SURVEYS:   COGNITION: Overall cognitive status: Within functional limits for tasks assessed  PALPATION: GH capsule hypomobility grossly   CERVICAL ROM:   Active ROM A/PROM (deg) eval AROM 06/12/24  Flexion 75   Extension 70   Right lateral flexion    Left lateral flexion    Right rotation 75 90  Left rotation 80 60   (Blank rows = not tested)  UPPER EXTREMITY ROM:  Active/Passive ROM Right eval Left eval Left 05/17/24 Left 06/20/24 Left 07/07/24 (after injection)  Shoulder flexion 180/180 120/120 135/140 85 pain (AROM) 145/170  Shoulder abduction 180/180 90/100 130/130  145/170  Shoulder extension       Shoulder adduction       Shoulder extension       Shoulder internal rotation T6 T12     Shoulder external rotation T2 L occiput     Elbow flexion       Elbow extension        Wrist flexion       Wrist extension       Wrist ulnar deviation       Wrist radial deviation       Wrist pronation       Wrist supination        (Blank rows = not tested)  UPPER EXTREMITY MMT:  MMT Right eval Left eval Right 05/17/24 Left 05/17/24 Left 06/20/24 Right 06/20/24  Shoulder flexion 5/5 3+/5      Shoulder extension        Shoulder abduction 5/5 3+/5      Shoulder adduction        Shoulder extension        Shoulder internal rotation 5/5 3+/5      Shoulder external rotation 5/5 3+/5      Middle trapezius        Lower trapezius        Elbow flexion 5/5 3+/5      Elbow extension 5/5 3+/5      Wrist flexion        Wrist extension        Wrist ulnar deviation        Wrist radial deviation        Wrist pronation        Wrist supination        Grip strength 69 25 71 28 14.9 lb 54 lbs   (Blank rows = not tested)   Extreme difficulty/unable (0), Quite a bit of difficulty (1), Moderate difficulty (2), Little difficulty (3), No difficulty (4) Survey date:  05/17/24 07/07/24  Any of your usual work, household or school activities 1 2  2. Your usual hobbies, recreational/sport activities 0 0   3. Lifting  a bag of groceries to waist level 1 2   4. Lifting a bag of groceries above your head 0 1  5. Grooming your hair 2 3  6. Pushing up on your hands (I.e. from bathtub or chair) 3 3  7. Preparing food (I.e. peeling/cutting) 0 2  8. Driving  0 3  9. Vacuuming, sweeping, or raking 1 2  10. Dressing  4 4  11. Doing up buttons 4 4  12. Using tools/appliances 1 2  13. Opening doors 3 4  14. Cleaning  0 2  15. Tying or lacing shoes 3 4  16. Sleeping  2 2  17. Laundering clothes (I.e. washing, ironing, folding) 0 2  18. Opening a jar 0 2  19. Throwing a ball 2 2  20. Carrying a small suitcase with your affected limb.  0 1  Score total:  27/80  47/80   Cervical   TREATMENT DATE:  MRI report reviewed with patient and MD visit discussed Recent MRI stated,   IMPRESSION: 1. Motion degraded exam. 2. Left subarticular to foraminal disc protrusion at C4-5 with resultant mild canal and severe left C5 foraminal stenosis. 3. Left eccentric disc osteophyte complex at C3-4 with resultant mild canal, with severe left and moderate right C4 foraminal stenosis. 4. Left eccentric disc bulge with uncovertebral spurring at C5-6 with resultant mild canal, with severe left and moderate right C6 foraminal stenosis. 5. Severe left C7 foraminal narrowing related to disc bulge and uncovertebral disease. ROM and grip strength assessed again. We discussed that AROM and strength are significantly lower compared to evaluation and reassessment today. Unsure of why exactly and it may be due to her pain but her pain was higher during the evaluation compared to today.  IMPRESSION: Moderate AC joint arthrosis with slight hypertrophy of the AC joint. Small reactive joint effusion.   Extravasation of intra-articular contrast suggested of a full-thickness perforation of the supraspinatus tendon. There is a least severe insertional tendinosis and a high-grade intrasubstance partial tear. No retracted tear is identified.   Exercises:  SciFit level 1: 10' pt did not need a rest or break from pain. Pulleys into flexion: 5' in flexion and 2' in scaption Pt provided resources to get shoulder pulley from [employer] for HEP. We discussed POC, as outlined below  Pt educated that because of pain and her loss of AROM, she probably has developed neural tension in L UE which may be causing some numbness in her left hand. Pt was educated that     PATIENT EDUCATION:  Education details: see above Person educated: Patient Education method: Explanation Education comprehension: verbalized understanding  HOME EXERCISE PROGRAM: 04/14/24: Standing wall slides into scaption: 10x Access Code: [email or web address] URL: [email or web address] Date: 04/25/2024 Prepared by: Raj Blanch  Exercises - Shoulder Flexion Wall Slide with Towel  - 1-2 x daily - 7 x weekly - 2 sets - 10 reps - Supine Shoulder Press with Dowel  - 1-2 x daily - 7 x weekly - 2 sets - 10 reps - Supine Shoulder Flexion with Dowel  - 1-2 x daily - 7 x weekly - 2 sets - 10 reps  Access Code: [email or web address] URL: [email or web address] Date: 05/03/2024 Prepared by: Raj Blanch  Exercises - Seated Bilateral Shoulder Flexion Towel Slide at Table Top  - 1 x daily - 7 x weekly - 2 sets - 10 reps - Standing Shoulder Row with Anchored Resistance  - 1 x daily - 7 x weekly - 2 sets -  10 reps - Seated Scapular Retraction  - 1 x daily - 7 x weekly - 2 sets - 10 reps - Split Stance Single Arm Chest Press with Resistance  - 1 x daily - 7 x weekly - 2 sets - 10 reps - Standing Bent Over Single Arm Shoulder Row  - 1 x daily - 7 x weekly - 2 sets - 10 reps  ASSESSMENT:  CLINICAL IMPRESSION: Pt reported significant relief from coritsone shot in her shoulder where pain has gone down from 6-9/10 to 0-3/10 with significant improvement in AROM as well. Patient will benefit from increase in therapy frequency to 3x/week for next 2 weeks to help improve ROM and maximize gains.  OBJECTIVE IMPAIRMENTS: decreased ROM, decreased strength, hypomobility, increased fascial restrictions, increased muscle spasms, impaired flexibility, impaired UE functional use, postural dysfunction, and pain.   ACTIVITY LIMITATIONS: carrying and sleeping  PARTICIPATION LIMITATIONS: meal prep, cleaning, laundry, driving, shopping, and community activity  PERSONAL FACTORS: Age and Time since onset of injury/illness/exacerbation are also affecting patient's functional outcome.   REHAB POTENTIAL: Good  CLINICAL DECISION MAKING: Stable/uncomplicated  EVALUATION COMPLEXITY: Moderate   GOALS: Goals reviewed with patient? Yes  SHORT TERM GOALS: No STG established due to POC <4 weeks   LONG TERM GOALS: Target date:  09/01/2024     Patient will be 100% compliant with HEP to self manage symptoms at home  Baseline: initiated 04/14/24 Goal status: progressing  2.  Patient will demo at least 10% improvement on UEFS to improve overall function. Baseline: TBD; 27/80 (05/17/24); 47/80 Goal status: Met 07/07/24  3.  Patient will demo grip strength in L UE to be >50 lbs to improve ability to hold and carry objects in L hand Baseline: 29 lbs (04/14/24); 28 lbs (05/17/24) Goal status: progressing  4.  Patient will demo L shoulder AROM to be above 160 deg to improve ability to reach in overhead cabinets. Baseline: 120 deg (eval); 135 deg (05/17/24) Goal status: progressing  5.  Pt will report overall max pain of <5/10 in L shoulder and L arm to improve function with L arm Baseline: 10/10 (04/14/24); 6-8/10 at worst (05/17/24) Goal status: progressing  6.  Patient will demo at least UEFS score of >60/80 to improve overall function. Baseline: TBD; 27/80 (05/17/24); 47/80 (07/04/24) Goal status: Revised 07/07/24   PLAN:  PT FREQUENCY: 3x/week for next 6 sessions and then 2x/week for 8 additional sessions.  PT DURATION: 10 weeks  PLANNED INTERVENTIONS: 97164- PT Re-evaluation, 97750- Physical Performance Testing, 97110-Therapeutic exercises, 97530- Therapeutic activity, 97112- Neuromuscular re-education, 97535- Self Care, [id number]- Manual therapy, [id number]- Electrical stimulation (unattended), Patient/Family education, Joint mobilization, Joint manipulation, Spinal manipulation, Spinal mobilization, DME instructions, Cryotherapy, and Moist heat  PLAN FOR NEXT SESSION: Aprogress HEP   Raj LOISE Blanch, PT 07/07/2024, 11:21 AM

## 2024-07-07 NOTE — Addendum Note (Signed)
 Addended by: TOBIE RAJ SAILOR on: 07/07/2024 12:25 PM   Modules accepted: Orders

## 2024-07-10 ENCOUNTER — Ambulatory Visit

## 2024-07-10 DIAGNOSIS — M6281 Muscle weakness (generalized): Secondary | ICD-10-CM

## 2024-07-10 DIAGNOSIS — M79602 Pain in left arm: Secondary | ICD-10-CM | POA: Diagnosis not present

## 2024-07-10 DIAGNOSIS — M25512 Pain in left shoulder: Secondary | ICD-10-CM

## 2024-07-10 DIAGNOSIS — M542 Cervicalgia: Secondary | ICD-10-CM

## 2024-07-10 NOTE — Therapy (Signed)
 OUTPATIENT PHYSICAL THERAPY TREATMENT NOTE   Patient Name: [name] MRN: [id number] DOB:Oct 09, 1966, 58 y.o., female Today's Date: 07/10/2024  END OF SESSION:  PT End of Session - 07/10/24 1152     Visit Number 12    Number of Visits 18    Date for Recertification  09/01/24    Authorization Type [employer] [place] medicaid  $4 copay  Approved 11 PT visits 04/19/2024 - 07/17/2024; Approved 6 visits 05/17/24-07/15/24 (Order#[id number])    Activity Tolerance Patient tolerated treatment well    Behavior During Therapy [facility] for tasks assessed/performed                 Past Medical History:  Diagnosis Date   Anemia    CAD (coronary artery disease)    PCI with stent to RCA in 2019   CHF (congestive heart failure) (HCC) 04/2011   EF 15-20% from echo 05/19/11   DOE (dyspnea on exertion)    DVT (deep venous thrombosis) (HCC) 06/2011   LLE   Fatigue    HTN (hypertension)    Hyperlipidemia    Menorrhagia    with iron  deficient anemia   NSTEMI (non-ST elevated myocardial infarction) (HCC) 10/26/2017   NSTEMI (non-ST elevated myocardial infarction) (HCC) 10/27/2017   Obesity    Orthopnea    Type II diabetes mellitus (HCC)    Past Surgical History:  Procedure Laterality Date   CORONARY ANGIOPLASTY WITH STENT PLACEMENT  2013;  10/27/2017   CORONARY BALLOON ANGIOPLASTY N/A 05/26/2022   Procedure: CORONARY BALLOON ANGIOPLASTY;  Surgeon: Elmira Newman PARAS, MD;  Location: [facility];  Service: Cardiovascular;  Laterality: N/A;   CORONARY STENT INTERVENTION N/A 10/27/2017   Procedure: CORONARY STENT INTERVENTION;  Surgeon: Elmira Newman PARAS, MD;  Location: [facility];  Service: Cardiovascular;  Laterality: N/A;   CORONARY STENT INTERVENTION N/A 03/10/2022   Procedure: CORONARY STENT INTERVENTION;  Surgeon: Ladona Heinz, MD;  Location: [facility];  Service: Cardiovascular;  Laterality: N/A;   CORONARY STENT INTERVENTION N/A 03/11/2022   Procedure: CORONARY STENT  INTERVENTION;  Surgeon: Elmira Newman PARAS, MD;  Location: [facility];  Service: Cardiovascular;  Laterality: N/A;  aborted   CORONARY STENT INTERVENTION N/A 10/20/2022   Procedure: CORONARY STENT INTERVENTION;  Surgeon: Elmira Newman PARAS, MD;  Location: [facility];  Service: Cardiovascular;  Laterality: N/A;   CORONARY ULTRASOUND/IVUS N/A 05/26/2022   Procedure: Intravascular Ultrasound/IVUS;  Surgeon: Elmira Newman PARAS, MD;  Location: [facility];  Service: Cardiovascular;  Laterality: N/A;   CORONARY ULTRASOUND/IVUS N/A 10/20/2022   Procedure: Intravascular Ultrasound/IVUS;  Surgeon: Elmira Newman PARAS, MD;  [facility]: [facility];  Service: Cardiovascular;  Laterality: N/A;   LEFT HEART CATH AND CORONARY ANGIOGRAPHY N/A 10/27/2017   Procedure: LEFT HEART CATH AND CORONARY ANGIOGRAPHY;  Surgeon: Elmira Newman PARAS, MD;  [facility]: [facility];  Service: Cardiovascular;  Laterality: N/A;   LEFT HEART CATH AND CORONARY ANGIOGRAPHY N/A 11/17/2019   Procedure: LEFT HEART CATH AND CORONARY ANGIOGRAPHY;  Surgeon: Elmira Newman PARAS, MD;  Location: [facility];  Service: Cardiovascular;  Laterality: N/A;   LEFT HEART CATH AND CORONARY ANGIOGRAPHY N/A 03/10/2022   Procedure: LEFT HEART CATH AND CORONARY ANGIOGRAPHY;  Surgeon: Ladona Heinz, MD;  [facility]: [facility];  Service: Cardiovascular;  Laterality: N/A;   LEFT HEART CATH AND CORONARY ANGIOGRAPHY N/A 07/27/2022   Procedure: LEFT HEART CATH AND CORONARY ANGIOGRAPHY;  Surgeon: Ladona Heinz, MD;  [facility]: [facility];  Service: Cardiovascular;  Laterality: N/A;   LEFT HEART CATH AND CORONARY ANGIOGRAPHY N/A 10/20/2022   Procedure: LEFT HEART CATH AND CORONARY ANGIOGRAPHY;  Surgeon: Elmira Newman PARAS, MD;  [facility];  Service: Cardiovascular;  Laterality: N/A;   LEFT HEART CATHETERIZATION WITH CORONARY ANGIOGRAM N/A 11/17/2011   Procedure: LEFT HEART CATHETERIZATION WITH  CORONARY ANGIOGRAM;  Surgeon: Erick JONELLE Bergamo, MD;  Location: [facility] CATH LAB;  Service: Cardiovascular;  Laterality: N/A;   OVARIAN CYST REMOVAL     TUBAL LIGATION  2006   ULTRASOUND GUIDANCE FOR VASCULAR ACCESS  10/27/2017   Procedure: Ultrasound Guidance For Vascular Access;  Surgeon: Elmira Newman PARAS, MD;  Location: [facility];  Service: Cardiovascular;;   Patient Active Problem List   Diagnosis Date Noted   Coronary artery disease involving native coronary artery of native heart without angina pectoris    Pleuritic pain 07/25/2022   History of non-ST elevation myocardial infarction (NSTEMI)    History of coronary angioplasty with insertion of stent    Mixed hyperlipidemia    Post PTCA 05/26/2022   Chronic iron  deficiency anemia 03/10/2022   Chronic diastolic CHF (congestive heart failure) (HCC) 03/10/2022   Non-insulin  dependent type 2 diabetes mellitus (HCC) 03/10/2022   Morbidly obese (HCC) 08/19/2020   Uncontrolled type 2 diabetes mellitus with hyperglycemia (HCC) 08/19/2020   Unstable angina (HCC) 11/16/2019   Non-ST elevation (NSTEMI) myocardial infarction (HCC) 10/27/2017   OSA (obstructive sleep apnea) 02/21/2013   Atherosclerosis of native coronary artery of native heart with stable angina pectoris (HCC) 11/18/2011   Dyslipidemia 11/18/2011   Precordial pain 11/17/2011   Abnormal cardiovascular stress test 11/17/2011   DVT (deep venous thrombosis) (HCC) 11/01/2011   Essential hypertension 11/01/2011   Blood loss anemia 11/01/2011   Exertional dyspnea 08/19/2011    PCP: Schuyler Lesser, PA-C  REFERRING PROVIDER: Denene Alamillo, Donika K, DO  REFERRING DIAG: R20.0 (ICD-10-CM) - Left arm numbness  THERAPY DIAG:  Pain in left arm  Muscle weakness (generalized)  Acute pain of left shoulder  Neck pain  Rationale for Evaluation and Treatment: Rehabilitation  ONSET DATE: 04/10/2024- date of referral  SUBJECTIVE:                                                                                                                                                                                                          SUBJECTIVE STATEMENT: She found the pulley that she had purchased at home before. She has used it for HEP in last couple of days.  Reports of neck pain that started last night. Hand dominance: Right  PERTINENT HISTORY:  CHF, CAD, DM  PAIN:  Are you having pain? Yes: NPRS scale: 2-3/10 Pain location: L side of neck, L arm Pain description: radiating, heavy, tingling Aggravating factors: unsure Relieving factors: unsure  PRECAUTIONS: None  RED FLAGS: None     WEIGHT BEARING RESTRICTIONS: No  FALLS:  Has patient fallen in last 6 months? No  PLOF: Independent  PATIENT GOALS: improve pain    OBJECTIVE:  Note: Objective measures were completed at Evaluation unless otherwise noted.  DIAGNOSTIC FINDINGS:  MRI of Brain: 02/17/24   IMPRESSION: 1. No acute intracranial abnormality. 2. Moderate cerebral white matter disease, nonspecific, but most likely related chronic microvascular ischemic disease. Probable tiny remote left cerebellar infarct.  PATIENT SURVEYS:   COGNITION: Overall cognitive status: Within functional limits for tasks assessed  PALPATION: GH capsule hypomobility grossly   CERVICAL ROM:   Active ROM A/PROM (deg) eval AROM 06/12/24  Flexion 75   Extension 70   Right lateral flexion    Left lateral flexion    Right rotation 75 90  Left rotation 80 60   (Blank rows = not tested)  UPPER EXTREMITY ROM:  Active/Passive ROM Right eval Left eval Left 05/17/24 Left 06/20/24 Left 07/07/24 (after injection)  Shoulder flexion 180/180 120/120 135/140 85 pain (AROM) 145/170  Shoulder abduction 180/180 90/100 130/130  145/170  Shoulder extension       Shoulder adduction       Shoulder extension       Shoulder internal rotation T6 T12     Shoulder external rotation T2 L occiput     Elbow flexion       Elbow  extension       Wrist flexion       Wrist extension       Wrist ulnar deviation       Wrist radial deviation       Wrist pronation       Wrist supination        (Blank rows = not tested)  UPPER EXTREMITY MMT:  MMT Right eval Left eval Right 05/17/24 Left 05/17/24 Left 06/20/24 Right 06/20/24  Shoulder flexion 5/5 3+/5      Shoulder extension        Shoulder abduction 5/5 3+/5      Shoulder adduction        Shoulder extension        Shoulder internal rotation 5/5 3+/5      Shoulder external rotation 5/5 3+/5      Middle trapezius        Lower trapezius        Elbow flexion 5/5 3+/5      Elbow extension 5/5 3+/5      Wrist flexion        Wrist extension        Wrist ulnar deviation        Wrist radial deviation        Wrist pronation        Wrist supination        Grip strength 69 25 71 28 14.9 lb 54 lbs   (Blank rows = not tested)   Extreme difficulty/unable (0), Quite a bit of difficulty (1), Moderate difficulty (2), Little difficulty (3), No difficulty (4) Survey date:  05/17/24 07/07/24  Any of your usual work, household or school activities 1 2  2. Your usual hobbies, recreational/sport activities 0 0  3. Lifting a bag of groceries to waist level 1 2   4. Lifting a bag of groceries above your head 0 1  5. Grooming your hair 2 3  6. Pushing up on your hands (I.e. from bathtub or chair) 3 3  7. Preparing food (I.e. peeling/cutting) 0 2  8. Driving  0 3  9. Vacuuming, sweeping, or raking 1 2  10. Dressing  4 4  11. Doing up buttons 4 4  12. Using tools/appliances 1 2  13. Opening doors 3 4  14. Cleaning  0 2  15. Tying or lacing shoes 3 4  16. Sleeping  2 2  17. Laundering clothes (I.e. washing, ironing, folding) 0 2  18. Opening a jar 0 2  19. Throwing a ball 2 2  20. Carrying a small suitcase with your affected limb.  0 1  Score total:  27/80  47/80   Cervical   TREATMENT DATE:  MRI report reviewed with patient and MD visit discussed Recent MRI stated,   IMPRESSION: 1. Motion degraded exam. 2. Left subarticular to foraminal disc protrusion at C4-5 with resultant mild canal and severe left C5 foraminal stenosis. 3. Left eccentric disc osteophyte complex at C3-4 with resultant mild canal, with severe left and moderate right C4 foraminal stenosis. 4. Left eccentric disc bulge with uncovertebral spurring at C5-6 with resultant mild canal, with severe left and moderate right C6 foraminal stenosis. 5. Severe left C7 foraminal narrowing related to disc bulge and uncovertebral disease. ROM and grip strength assessed again. We discussed that AROM and strength are significantly lower compared to evaluation and reassessment today. Unsure of why exactly and it may be due to her pain but her pain was higher during the evaluation compared to today.  IMPRESSION: Moderate AC joint arthrosis with slight hypertrophy of the AC joint. Small reactive joint effusion.   Extravasation of intra-articular contrast suggested of a full-thickness perforation of the supraspinatus tendon. There is a least severe insertional tendinosis and a high-grade intrasubstance partial tear. No retracted tear is identified.   Exercises:  SciFit level 1: 10' pt did not need a rest or break from pain. UE ranger: flexion and abduction: 2 x 10 each Rows: red sport cord: 2 x 10 Shoulder extensions: yellow sport cord: 2 x 10 Pulleys into flexion: 5' Reviewed wall slides verbally Seated biceps flexion: 5lbs 2 x 10 Seated scaption raises: 1lbs 3 x 5 Soft tissue mobilization to L upper trap, cervical paraspinalis   PATIENT EDUCATION:  Education details: see above Person educated: Patient Education method: Explanation Education comprehension: verbalized understanding  HOME EXERCISE PROGRAM: 04/14/24: Standing wall slides into scaption: 10x Access Code: [email or web address] URL: [email or web address] Date: 04/25/2024 Prepared by: Raj Blanch  Exercises - Shoulder  Flexion Wall Slide with Towel  - 1-2 x daily - 7 x weekly - 2 sets - 10 reps - Supine Shoulder Press with Dowel  - 1-2 x daily - 7 x weekly - 2 sets - 10 reps - Supine Shoulder Flexion with Dowel  - 1-2 x daily - 7 x weekly - 2 sets - 10 reps  Access Code: [email or web address] URL: [email or web address] Date: 05/03/2024 Prepared by: Raj Blanch  Exercises - Seated Bilateral Shoulder Flexion Towel Slide at Table Top  - 1 x daily - 7 x weekly - 2 sets - 10 reps - Standing Shoulder Row with Anchored Resistance  - 1 x daily - 7 x weekly - 2 sets - 10 reps - Seated Scapular Retraction  -  1 x daily - 7 x weekly - 2 sets - 10 reps - Split Stance Single Arm Chest Press with Resistance  - 1 x daily - 7 x weekly - 2 sets - 10 reps - Standing Bent Over Single Arm Shoulder Row  - 1 x daily - 7 x weekly - 2 sets - 10 reps   Access Code: [email or web address] URL: [email or web address] Date: 07/10/2024 Prepared by: Raj Blanch  Exercises - Shoulder Flexion Wall Slide with Towel  - 3 x daily - 7 x weekly - 15 reps - Seated Shoulder Flexion AAROM with Pulley Behind  - 3 x daily - 7 x weekly - Standing Shoulder Row with Anchored Resistance  - 1 x daily - 7 x weekly - 2 sets - 10 reps - Shoulder extension with resistance - Neutral  - 1 x daily - 7 x weekly - 2 sets - 10 reps ASSESSMENT:  CLINICAL IMPRESSION: Overall ROM is better but patient has decreased muscle endurance as patient reports fatigue with exercises. Pt also had increased neck pain that started insiduously last night.  OBJECTIVE IMPAIRMENTS: decreased ROM, decreased strength, hypomobility, increased fascial restrictions, increased muscle spasms, impaired flexibility, impaired UE functional use, postural dysfunction, and pain.   ACTIVITY LIMITATIONS: carrying and sleeping  PARTICIPATION LIMITATIONS: meal prep, cleaning, laundry, driving, shopping, and community activity  PERSONAL FACTORS: Age and Time since onset of  injury/illness/exacerbation are also affecting patient's functional outcome.   REHAB POTENTIAL: Good  CLINICAL DECISION MAKING: Stable/uncomplicated  EVALUATION COMPLEXITY: Moderate   GOALS: Goals reviewed with patient? Yes  SHORT TERM GOALS: No STG established due to POC <4 weeks   LONG TERM GOALS: Target date: 09/01/2024     Patient will be 100% compliant with HEP to self manage symptoms at home  Baseline: initiated 04/14/24 Goal status: progressing  2.  Patient will demo at least 10% improvement on UEFS to improve overall function. Baseline: TBD; 27/80 (05/17/24); 47/80 Goal status: Met 07/07/24  3.  Patient will demo grip strength in L UE to be >50 lbs to improve ability to hold and carry objects in L hand Baseline: 29 lbs (04/14/24); 28 lbs (05/17/24) Goal status: progressing  4.  Patient will demo L shoulder AROM to be above 160 deg to improve ability to reach in overhead cabinets. Baseline: 120 deg (eval); 135 deg (05/17/24) Goal status: progressing  5.  Pt will report overall max pain of <5/10 in L shoulder and L arm to improve function with L arm Baseline: 10/10 (04/14/24); 6-8/10 at worst (05/17/24) Goal status: progressing  6.  Patient will demo at least UEFS score of >60/80 to improve overall function. Baseline: TBD; 27/80 (05/17/24); 47/80 (07/04/24) Goal status: Revised 07/07/24   PLAN:  PT FREQUENCY: 3x/week for next 6 sessions and then 2x/week for 8 additional sessions.  PT DURATION: 10 weeks  PLANNED INTERVENTIONS: [id number]- PT Re-evaluation, 97750- Physical Performance Testing, 97110-Therapeutic exercises, 97530- Therapeutic activity, [id number]- Neuromuscular re-education, 97535- Self Care, [id number]- Manual therapy, [id number]- Electrical stimulation (unattended), Patient/Family education, Joint mobilization, Joint manipulation, Spinal manipulation, Spinal mobilization, DME instructions, Cryotherapy, and Moist heat  PLAN FOR NEXT SESSION: Aprogress HEP   Raj LOISE Blanch, PT 07/10/2024, 12:13 PM

## 2024-07-11 ENCOUNTER — Ambulatory Visit

## 2024-07-11 DIAGNOSIS — M6281 Muscle weakness (generalized): Secondary | ICD-10-CM

## 2024-07-11 DIAGNOSIS — M542 Cervicalgia: Secondary | ICD-10-CM

## 2024-07-11 DIAGNOSIS — M79602 Pain in left arm: Secondary | ICD-10-CM | POA: Diagnosis not present

## 2024-07-11 DIAGNOSIS — M25512 Pain in left shoulder: Secondary | ICD-10-CM

## 2024-07-11 NOTE — Therapy (Signed)
 OUTPATIENT PHYSICAL THERAPY TREATMENT NOTE   Patient Name: [name] MRN: [id number] DOB:[birth year], 58 y.o., female Today's Date: 07/11/2024  END OF SESSION:  PT End of Session - 07/11/24 1112     Visit Number 13    Number of Visits 18    Date for Recertification  09/01/24    Authorization Type [place] medicaid  $4 copay  Approved 11 PT visits 04/19/2024 - 07/17/2024; Approved 6 visits 05/17/24-07/15/24 (Order#[id number])    PT Start Time 1100    PT Stop Time 1150    PT Time Calculation (min) 50 min    Activity Tolerance Patient tolerated treatment well    Behavior During Therapy [facility] for tasks assessed/performed                 Past Medical History:  Diagnosis Date   Anemia    CAD (coronary artery disease)    PCI with stent to RCA in 2019   CHF (congestive heart failure) (HCC) 04/2011   EF 15-20% from echo 05/19/11   DOE (dyspnea on exertion)    DVT (deep venous thrombosis) (HCC) 06/2011   LLE   Fatigue    HTN (hypertension)    Hyperlipidemia    Menorrhagia    with iron  deficient anemia   NSTEMI (non-ST elevated myocardial infarction) (HCC) 10/26/2017   NSTEMI (non-ST elevated myocardial infarction) (HCC) 10/27/2017   Obesity    Orthopnea    Type II diabetes mellitus (HCC)    Past Surgical History:  Procedure Laterality Date   CORONARY ANGIOPLASTY WITH STENT PLACEMENT  2013;  10/27/2017   CORONARY BALLOON ANGIOPLASTY N/A 05/26/2022   Procedure: CORONARY BALLOON ANGIOPLASTY;  Surgeon: Elmira Newman PARAS, MD;  Location: [facility];  Service: Cardiovascular;  Laterality: N/A;   CORONARY STENT INTERVENTION N/A 10/27/2017   Procedure: CORONARY STENT INTERVENTION;  Surgeon: Elmira Newman PARAS, MD;  Location: [facility];  Service: Cardiovascular;  Laterality: N/A;   CORONARY STENT INTERVENTION N/A 03/10/2022   Procedure: CORONARY STENT INTERVENTION;  Surgeon: Ladona Heinz, MD;  Location: [facility];  Service: Cardiovascular;  Laterality:  N/A;   CORONARY STENT INTERVENTION N/A 03/11/2022   Procedure: CORONARY STENT INTERVENTION;  Surgeon: Elmira Newman PARAS, MD;  Location: [facility];  Service: Cardiovascular;  Laterality: N/A;  aborted   CORONARY STENT INTERVENTION N/A 10/20/2022   Procedure: CORONARY STENT INTERVENTION;  Surgeon: Elmira Newman PARAS, MD;  [facility]: [facility];  Service: Cardiovascular;  Laterality: N/A;   CORONARY ULTRASOUND/IVUS N/A 05/26/2022   Procedure: Intravascular Ultrasound/IVUS;  Surgeon: Elmira Newman PARAS, MD;  [facility]: [facility];  Service: Cardiovascular;  Laterality: N/A;   CORONARY ULTRASOUND/IVUS N/A 10/20/2022   Procedure: Intravascular Ultrasound/IVUS;  Surgeon: Elmira Newman PARAS, MD;  [facility]: [facility];  Service: Cardiovascular;  Laterality: N/A;   LEFT HEART CATH AND CORONARY ANGIOGRAPHY N/A 10/27/2017   Procedure: LEFT HEART CATH AND CORONARY ANGIOGRAPHY;  Surgeon: Elmira Newman PARAS, MD;  [facility]: [facility];  Service: Cardiovascular;  Laterality: N/A;   LEFT HEART CATH AND CORONARY ANGIOGRAPHY N/A 11/17/2019   Procedure: LEFT HEART CATH AND CORONARY ANGIOGRAPHY;  Surgeon: Elmira Newman PARAS, MD;  [facility]: [facility];  Service: Cardiovascular;  Laterality: N/A;   LEFT HEART CATH AND CORONARY ANGIOGRAPHY N/A 03/10/2022   Procedure: LEFT HEART CATH AND CORONARY ANGIOGRAPHY;  Surgeon: Ladona Heinz, MD;  [facility]: [facility];  Service: Cardiovascular;  Laterality: N/A;  LEFT HEART CATH AND CORONARY ANGIOGRAPHY N/A 07/27/2022   Procedure: LEFT HEART CATH AND CORONARY ANGIOGRAPHY;  Surgeon: Ladona Heinz, MD;  [facility];  Service: Cardiovascular;  Laterality: N/A;   LEFT HEART CATH AND CORONARY ANGIOGRAPHY N/A 10/20/2022   Procedure: LEFT HEART CATH AND CORONARY ANGIOGRAPHY;  Surgeon: Elmira Newman PARAS, MD;  [facility];  Service: Cardiovascular;  Laterality: N/A;   LEFT HEART CATHETERIZATION WITH  CORONARY ANGIOGRAM N/A 11/17/2011   Procedure: LEFT HEART CATHETERIZATION WITH CORONARY ANGIOGRAM;  Surgeon: Erick JONELLE Ladona, MD;  Location: [facility] CATH LAB;  Service: Cardiovascular;  Laterality: N/A;   OVARIAN CYST REMOVAL     TUBAL LIGATION  2006   ULTRASOUND GUIDANCE FOR VASCULAR ACCESS  10/27/2017   Procedure: Ultrasound Guidance For Vascular Access;  Surgeon: Elmira Newman PARAS, MD;  Location: [facility];  Service: Cardiovascular;;   Patient Active Problem List   Diagnosis Date Noted   Coronary artery disease involving native coronary artery of native heart without angina pectoris    Pleuritic pain 07/25/2022   History of non-ST elevation myocardial infarction (NSTEMI)    History of coronary angioplasty with insertion of stent    Mixed hyperlipidemia    Post PTCA 05/26/2022   Chronic iron  deficiency anemia 03/10/2022   Chronic diastolic CHF (congestive heart failure) (HCC) 03/10/2022   Non-insulin  dependent type 2 diabetes mellitus (HCC) 03/10/2022   Morbidly obese (HCC) 08/19/2020   Uncontrolled type 2 diabetes mellitus with hyperglycemia (HCC) 08/19/2020   Unstable angina (HCC) 11/16/2019   Non-ST elevation (NSTEMI) myocardial infarction (HCC) 10/27/2017   OSA (obstructive sleep apnea) 02/21/2013   Atherosclerosis of native coronary artery of native heart with stable angina pectoris 11/18/2011   Dyslipidemia 11/18/2011   Precordial pain 11/17/2011   Abnormal cardiovascular stress test 11/17/2011   DVT (deep venous thrombosis) (HCC) 11/01/2011   Essential hypertension 11/01/2011   Blood loss anemia 11/01/2011   Exertional dyspnea 08/19/2011    PCP: Schuyler Lesser, PA-C  REFERRING PROVIDER: Huntley Demedeiros, Donika K, DO  REFERRING DIAG: R20.0 (ICD-10-CM) - Left arm numbness  THERAPY DIAG:  Muscle weakness (generalized)  Acute pain of left shoulder  Neck pain  Rationale for Evaluation and Treatment: Rehabilitation  ONSET DATE: 04/10/2024- date of  referral  SUBJECTIVE:                                                                                                                                                                                                         SUBJECTIVE STATEMENT: She was very sore after  massage in neck after yesterday. She didn't do heating pad when she got home.  Hand dominance: Right  PERTINENT HISTORY:  CHF, CAD, DM  PAIN:  Are you having pain? Yes: NPRS scale: 2-3/10 Pain location: L side of neck, L arm Pain description: radiating, heavy, tingling Aggravating factors: unsure Relieving factors: unsure  PRECAUTIONS: None  RED FLAGS: None     WEIGHT BEARING RESTRICTIONS: No  FALLS:  Has patient fallen in last 6 months? No  PLOF: Independent  PATIENT GOALS: improve pain    OBJECTIVE:  Note: Objective measures were completed at Evaluation unless otherwise noted.  DIAGNOSTIC FINDINGS:  MRI of Brain: 02/17/24   IMPRESSION: 1. No acute intracranial abnormality. 2. Moderate cerebral white matter disease, nonspecific, but most likely related chronic microvascular ischemic disease. Probable tiny remote left cerebellar infarct.  PATIENT SURVEYS:   COGNITION: Overall cognitive status: Within functional limits for tasks assessed  PALPATION: GH capsule hypomobility grossly   CERVICAL ROM:   Active ROM A/PROM (deg) eval AROM 06/12/24  Flexion 75   Extension 70   Right lateral flexion    Left lateral flexion    Right rotation 75 90  Left rotation 80 60   (Blank rows = not tested)  UPPER EXTREMITY ROM:  Active/Passive ROM Right eval Left eval Left 05/17/24 Left 06/20/24 Left 07/07/24 (after injection)  Shoulder flexion 180/180 120/120 135/140 85 pain (AROM) 145/170  Shoulder abduction 180/180 90/100 130/130  145/170  Shoulder extension       Shoulder adduction       Shoulder extension       Shoulder internal rotation T6 T12     Shoulder external rotation T2 L occiput      Elbow flexion       Elbow extension       Wrist flexion       Wrist extension       Wrist ulnar deviation       Wrist radial deviation       Wrist pronation       Wrist supination        (Blank rows = not tested)  UPPER EXTREMITY MMT:  MMT Right eval Left eval Right 05/17/24 Left 05/17/24 Left 06/20/24 Right 06/20/24  Shoulder flexion 5/5 3+/5      Shoulder extension        Shoulder abduction 5/5 3+/5      Shoulder adduction        Shoulder extension        Shoulder internal rotation 5/5 3+/5      Shoulder external rotation 5/5 3+/5      Middle trapezius        Lower trapezius        Elbow flexion 5/5 3+/5      Elbow extension 5/5 3+/5      Wrist flexion        Wrist extension        Wrist ulnar deviation        Wrist radial deviation        Wrist pronation        Wrist supination        Grip strength 69 25 71 28 14.9 lb 54 lbs   (Blank rows = not tested)   Extreme difficulty/unable (0), Quite a bit of difficulty (1), Moderate difficulty (2), Little difficulty (3), No difficulty (4) Survey date:  05/17/24 07/07/24  Any of your usual work, household or school activities 1 2  2. Your usual  hobbies, recreational/sport activities 0 0   3. Lifting a bag of groceries to waist level 1 2   4. Lifting a bag of groceries above your head 0 1  5. Grooming your hair 2 3  6. Pushing up on your hands (I.e. from bathtub or chair) 3 3  7. Preparing food (I.e. peeling/cutting) 0 2  8. Driving  0 3  9. Vacuuming, sweeping, or raking 1 2  10. Dressing  4 4  11. Doing up buttons 4 4  12. Using tools/appliances 1 2  13. Opening doors 3 4  14. Cleaning  0 2  15. Tying or lacing shoes 3 4  16. Sleeping  2 2  17. Laundering clothes (I.e. washing, ironing, folding) 0 2  18. Opening a jar 0 2  19. Throwing a ball 2 2  20. Carrying a small suitcase with your affected limb.  0 1  Score total:  27/80  47/80   Cervical   TREATMENT DATE:  MRI report reviewed with patient and MD visit  discussed Recent MRI stated,  IMPRESSION: 1. Motion degraded exam. 2. Left subarticular to foraminal disc protrusion at C4-5 with resultant mild canal and severe left C5 foraminal stenosis. 3. Left eccentric disc osteophyte complex at C3-4 with resultant mild canal, with severe left and moderate right C4 foraminal stenosis. 4. Left eccentric disc bulge with uncovertebral spurring at C5-6 with resultant mild canal, with severe left and moderate right C6 foraminal stenosis. 5. Severe left C7 foraminal narrowing related to disc bulge and uncovertebral disease. ROM and grip strength assessed again. We discussed that AROM and strength are significantly lower compared to evaluation and reassessment today. Unsure of why exactly and it may be due to her pain but her pain was higher during the evaluation compared to today.  IMPRESSION: Moderate AC joint arthrosis with slight hypertrophy of the AC joint. Small reactive joint effusion.   Extravasation of intra-articular contrast suggested of a full-thickness perforation of the supraspinatus tendon. There is a least severe insertional tendinosis and a high-grade intrasubstance partial tear. No retracted tear is identified.   Exercises:  UBE: level 3 4' fwd, 2' bwd Seated cervical side bending stretch: 5 x 20 on L and 2 x 20 on R UE ranger: flexion and abduction: 2 x 10 each Rows: black sport cord: 3 x 10 Shoulder extensions: yellow sport cord: 3 x 10 Supine: shoulder flexion, shoulder extension, shoulder H. Abduction/H. Adduction: yellow sport cord: 10x L only Grade IV lateral glides C4-6 L to R STM to left cervical paraspinalis Hot pack to C-spine for 10' at end of the session    PATIENT EDUCATION:  Education details: see above Person educated: Patient Education method: Explanation Education comprehension: verbalized understanding  HOME EXERCISE PROGRAM:   Access Code: [email or web address] URL: [email or web address] Date:  07/11/2024 Prepared by: Raj Blanch  Exercises - Shoulder Flexion Wall Slide with Towel  - 3 x daily - 7 x weekly - 15 reps - Seated Shoulder Flexion AAROM with Pulley Behind  - 3 x daily - 7 x weekly - Standing Shoulder Row with Anchored Resistance  - 1 x daily - 7 x weekly - 2 sets - 10 reps - Shoulder extension with resistance - Neutral  - 1 x daily - 7 x weekly - 2 sets - 10 reps - Seated Cervical Sidebending Stretch  - 1-2 x daily - 7 x weekly - 3-5 reps - 20 sec hold ASSESSMENT:  CLINICAL IMPRESSION: Overall ROM is better but  patient has decreased muscle endurance as patient reports fatigue with exercises. Pt also had increased neck pain that started insiduously last night.  OBJECTIVE IMPAIRMENTS: decreased ROM, decreased strength, hypomobility, increased fascial restrictions, increased muscle spasms, impaired flexibility, impaired UE functional use, postural dysfunction, and pain.   ACTIVITY LIMITATIONS: carrying and sleeping  PARTICIPATION LIMITATIONS: meal prep, cleaning, laundry, driving, shopping, and community activity  PERSONAL FACTORS: Age and Time since onset of injury/illness/exacerbation are also affecting patient's functional outcome.   REHAB POTENTIAL: Good  CLINICAL DECISION MAKING: Stable/uncomplicated  EVALUATION COMPLEXITY: Moderate   GOALS: Goals reviewed with patient? Yes  SHORT TERM GOALS: No STG established due to POC <4 weeks   LONG TERM GOALS: Target date: 09/01/2024     Patient will be 100% compliant with HEP to self manage symptoms at home  Baseline: initiated 04/14/24 Goal status: progressing  2.  Patient will demo at least 10% improvement on UEFS to improve overall function. Baseline: TBD; 27/80 (05/17/24); 47/80 Goal status: Met 07/07/24  3.  Patient will demo grip strength in L UE to be >50 lbs to improve ability to hold and carry objects in L hand Baseline: 29 lbs (04/14/24); 28 lbs (05/17/24) Goal status: progressing  4.  Patient  will demo L shoulder AROM to be above 160 deg to improve ability to reach in overhead cabinets. Baseline: 120 deg (eval); 135 deg (05/17/24) Goal status: progressing  5.  Pt will report overall max pain of <5/10 in L shoulder and L arm to improve function with L arm Baseline: 10/10 (04/14/24); 6-8/10 at worst (05/17/24) Goal status: progressing  6.  Patient will demo at least UEFS score of >60/80 to improve overall function. Baseline: TBD; 27/80 (05/17/24); 47/80 (07/04/24) Goal status: Revised 07/07/24   PLAN:  PT FREQUENCY: 3x/week for next 6 sessions and then 2x/week for 8 additional sessions.  PT DURATION: 10 weeks  PLANNED INTERVENTIONS: [id number]- PT Re-evaluation, 97750- Physical Performance Testing, 97110-Therapeutic exercises, 97530- Therapeutic activity, 97112- Neuromuscular re-education, 97535- Self Care, [id number]- Manual therapy, [id number]- Electrical stimulation (unattended), Patient/Family education, Joint mobilization, Joint manipulation, Spinal manipulation, Spinal mobilization, DME instructions, Cryotherapy, and Moist heat  PLAN FOR NEXT SESSION: Aprogress HEP   Raj LOISE Blanch, PT 07/11/2024, 11:45 AM

## 2024-07-13 ENCOUNTER — Ambulatory Visit

## 2024-07-13 ENCOUNTER — Other Ambulatory Visit: Payer: Self-pay

## 2024-07-13 DIAGNOSIS — M79602 Pain in left arm: Secondary | ICD-10-CM | POA: Diagnosis not present

## 2024-07-13 DIAGNOSIS — M6281 Muscle weakness (generalized): Secondary | ICD-10-CM

## 2024-07-13 DIAGNOSIS — M25512 Pain in left shoulder: Secondary | ICD-10-CM

## 2024-07-13 DIAGNOSIS — M542 Cervicalgia: Secondary | ICD-10-CM

## 2024-07-13 NOTE — Therapy (Signed)
 OUTPATIENT PHYSICAL THERAPY TREATMENT NOTE   Patient Name: [name] MRN: [id number] DOB:[birth year], 58 y.o., female Today's Date: 07/13/2024  END OF SESSION:  PT End of Session - 07/13/24 1114     Visit Number 14    Number of Visits 18    Date for Recertification  09/01/24    Authorization Type [employer] [place] medicaid  $4 copay  Approved 11 PT visits 04/19/2024 - 07/17/2024; Approved 6 visits 05/17/24-07/15/24 (Order#[id number])    PT Start Time 1105    PT Stop Time 1145    PT Time Calculation (min) 40 min    Activity Tolerance Patient tolerated treatment well    Behavior During Therapy [facility] for tasks assessed/performed                 Past Medical History:  Diagnosis Date   Anemia    CAD (coronary artery disease)    PCI with stent to RCA in 2019   CHF (congestive heart failure) (HCC) 04/2011   EF 15-20% from echo 05/19/11   DOE (dyspnea on exertion)    DVT (deep venous thrombosis) (HCC) 06/2011   LLE   Fatigue    HTN (hypertension)    Hyperlipidemia    Menorrhagia    with iron  deficient anemia   NSTEMI (non-ST elevated myocardial infarction) (HCC) 10/26/2017   NSTEMI (non-ST elevated myocardial infarction) (HCC) 10/27/2017   Obesity    Orthopnea    Type II diabetes mellitus (HCC)    Past Surgical History:  Procedure Laterality Date   CORONARY ANGIOPLASTY WITH STENT PLACEMENT  2013;  10/27/2017   CORONARY BALLOON ANGIOPLASTY N/A 05/26/2022   Procedure: CORONARY BALLOON ANGIOPLASTY;  Surgeon: Elmira Newman PARAS, MD;  Location: [facility];  Service: Cardiovascular;  Laterality: N/A;   CORONARY STENT INTERVENTION N/A 10/27/2017   Procedure: CORONARY STENT INTERVENTION;  Surgeon: Elmira Newman PARAS, MD;  Location: [facility];  Service: Cardiovascular;  Laterality: N/A;   CORONARY STENT INTERVENTION N/A 03/10/2022   Procedure: CORONARY STENT INTERVENTION;  Surgeon: Ladona Heinz, MD;  Location: [facility];  Service: Cardiovascular;  Laterality:  N/A;   CORONARY STENT INTERVENTION N/A 03/11/2022   Procedure: CORONARY STENT INTERVENTION;  Surgeon: Elmira Newman PARAS, MD;  Location: [facility];  Service: Cardiovascular;  Laterality: N/A;  aborted   CORONARY STENT INTERVENTION N/A 10/20/2022   Procedure: CORONARY STENT INTERVENTION;  Surgeon: Elmira Newman PARAS, MD;  [facility];  Service: Cardiovascular;  Laterality: N/A;   CORONARY ULTRASOUND/IVUS N/A 05/26/2022   Procedure: Intravascular Ultrasound/IVUS;  Surgeon: Elmira Newman PARAS, MD;  [facility]: [facility];  Service: Cardiovascular;  Laterality: N/A;   CORONARY ULTRASOUND/IVUS N/A 10/20/2022   Procedure: Intravascular Ultrasound/IVUS;  Surgeon: Elmira Newman PARAS, MD;  [facility];  Service: Cardiovascular;  Laterality: N/A;   LEFT HEART CATH AND CORONARY ANGIOGRAPHY N/A 10/27/2017   Procedure: LEFT HEART CATH AND CORONARY ANGIOGRAPHY;  Surgeon: Elmira Newman PARAS, MD;  [facility];  Service: Cardiovascular;  Laterality: N/A;   LEFT HEART CATH AND CORONARY ANGIOGRAPHY N/A 11/17/2019   Procedure: LEFT HEART CATH AND CORONARY ANGIOGRAPHY;  Surgeon: Elmira Newman PARAS, MD;  [facility];  Service: Cardiovascular;  Laterality: N/A;   LEFT HEART CATH AND CORONARY ANGIOGRAPHY N/A 03/10/2022   Procedure: LEFT HEART CATH AND CORONARY ANGIOGRAPHY;  Surgeon: Ladona Heinz, MD;  [facility];  Service: Cardiovascular;  Laterality: N/A;  LEFT HEART CATH AND CORONARY ANGIOGRAPHY N/A 07/27/2022   Procedure: LEFT HEART CATH AND CORONARY ANGIOGRAPHY;  Surgeon: Ladona Heinz, MD;  [facility];  Service: Cardiovascular;  Laterality: N/A;   LEFT HEART CATH AND CORONARY ANGIOGRAPHY N/A 10/20/2022   Procedure: LEFT HEART CATH AND CORONARY ANGIOGRAPHY;  Surgeon: Elmira Newman PARAS, MD;  [facility];  Service: Cardiovascular;  Laterality: N/A;   LEFT HEART CATHETERIZATION WITH  CORONARY ANGIOGRAM N/A 11/17/2011   Procedure: LEFT HEART CATHETERIZATION WITH CORONARY ANGIOGRAM;  Surgeon: Erick JONELLE Ladona, MD;  Location: [facility];  Service: Cardiovascular;  Laterality: N/A;   OVARIAN CYST REMOVAL     TUBAL LIGATION  2006   ULTRASOUND GUIDANCE FOR VASCULAR ACCESS  10/27/2017   Procedure: Ultrasound Guidance For Vascular Access;  Surgeon: Elmira Newman PARAS, MD;  Location: [facility];  Service: Cardiovascular;;   Patient Active Problem List   Diagnosis Date Noted   Coronary artery disease involving native coronary artery of native heart without angina pectoris    Pleuritic pain 07/25/2022   History of non-ST elevation myocardial infarction (NSTEMI)    History of coronary angioplasty with insertion of stent    Mixed hyperlipidemia    Post PTCA 05/26/2022   Chronic iron  deficiency anemia 03/10/2022   Chronic diastolic CHF (congestive heart failure) (HCC) 03/10/2022   Non-insulin  dependent type 2 diabetes mellitus (HCC) 03/10/2022   Morbidly obese (HCC) 08/19/2020   Uncontrolled type 2 diabetes mellitus with hyperglycemia (HCC) 08/19/2020   Unstable angina (HCC) 11/16/2019   Non-ST elevation (NSTEMI) myocardial infarction (HCC) 10/27/2017   OSA (obstructive sleep apnea) 02/21/2013   Atherosclerosis of native coronary artery of native heart with stable angina pectoris 11/18/2011   Dyslipidemia 11/18/2011   Precordial pain 11/17/2011   Abnormal cardiovascular stress test 11/17/2011   DVT (deep venous thrombosis) (HCC) 11/01/2011   Essential hypertension 11/01/2011   Blood loss anemia 11/01/2011   Exertional dyspnea 08/19/2011    PCP: Schuyler Lesser, PA-C  REFERRING PROVIDER: Tobie Tonita POUR, DO  REFERRING DIAG: R20.0 (ICD-10-CM) - Left arm numbness  THERAPY DIAG:  Muscle weakness (generalized)  Acute pain of left shoulder  Neck pain  Pain in left arm  Rationale for Evaluation and Treatment: Rehabilitation  ONSET DATE: 04/10/2024- date of  referral  SUBJECTIVE:                                                                                                                                                                                                         SUBJECTIVE STATEMENT:  Pt reports neck pain was better after last session. Pain in neck is 1/10. Overall numbness in L hand is also improving.  Hand dominance: Right  PERTINENT HISTORY:  CHF, CAD, DM  PAIN:  Are you having pain? Yes: NPRS scale: 1/10 Pain location: L side of neck, L arm Pain description: radiating, heavy, tingling Aggravating factors: unsure Relieving factors: unsure  PRECAUTIONS: None  RED FLAGS: None     WEIGHT BEARING RESTRICTIONS: No  FALLS:  Has patient fallen in last 6 months? No  PLOF: Independent  PATIENT GOALS: improve pain    OBJECTIVE:  Note: Objective measures were completed at Evaluation unless otherwise noted.  DIAGNOSTIC FINDINGS:  MRI of Brain: 02/17/24   IMPRESSION: 1. No acute intracranial abnormality. 2. Moderate cerebral white matter disease, nonspecific, but most likely related chronic microvascular ischemic disease. Probable tiny remote left cerebellar infarct.  PATIENT SURVEYS:   COGNITION: Overall cognitive status: Within functional limits for tasks assessed  PALPATION: GH capsule hypomobility grossly   CERVICAL ROM:   Active ROM A/PROM (deg) eval AROM 06/12/24  Flexion 75   Extension 70   Right lateral flexion    Left lateral flexion    Right rotation 75 90  Left rotation 80 60   (Blank rows = not tested)  UPPER EXTREMITY ROM:  Active/Passive ROM Right eval Left eval Left 05/17/24 Left 06/20/24 Left 07/07/24 (after injection)  Shoulder flexion 180/180 120/120 135/140 85 pain (AROM) 145/170  Shoulder abduction 180/180 90/100 130/130  145/170  Shoulder extension       Shoulder adduction       Shoulder extension       Shoulder internal rotation T6 T12     Shoulder external rotation T2 L  occiput     Elbow flexion       Elbow extension       Wrist flexion       Wrist extension       Wrist ulnar deviation       Wrist radial deviation       Wrist pronation       Wrist supination        (Blank rows = not tested)  UPPER EXTREMITY MMT:  MMT Right eval Left eval Right 05/17/24 Left 05/17/24 Left 06/20/24 Right 06/20/24  Shoulder flexion 5/5 3+/5      Shoulder extension        Shoulder abduction 5/5 3+/5      Shoulder adduction        Shoulder extension        Shoulder internal rotation 5/5 3+/5      Shoulder external rotation 5/5 3+/5      Middle trapezius        Lower trapezius        Elbow flexion 5/5 3+/5      Elbow extension 5/5 3+/5      Wrist flexion        Wrist extension        Wrist ulnar deviation        Wrist radial deviation        Wrist pronation        Wrist supination        Grip strength 69 25 71 28 14.9 lb 54 lbs   (Blank rows = not tested)   Extreme difficulty/unable (0), Quite a bit of difficulty (1), Moderate difficulty (2), Little difficulty (3), No difficulty (4) Survey date:  05/17/24 07/07/24  Any of your usual work, household or  school activities 1 2  2. Your usual hobbies, recreational/sport activities 0 0   3. Lifting a bag of groceries to waist level 1 2   4. Lifting a bag of groceries above your head 0 1  5. Grooming your hair 2 3  6. Pushing up on your hands (I.e. from bathtub or chair) 3 3  7. Preparing food (I.e. peeling/cutting) 0 2  8. Driving  0 3  9. Vacuuming, sweeping, or raking 1 2  10. Dressing  4 4  11. Doing up buttons 4 4  12. Using tools/appliances 1 2  13. Opening doors 3 4  14. Cleaning  0 2  15. Tying or lacing shoes 3 4  16. Sleeping  2 2  17. Laundering clothes (I.e. washing, ironing, folding) 0 2  18. Opening a jar 0 2  19. Throwing a ball 2 2  20. Carrying a small suitcase with your affected limb.  0 1  Score total:  27/80  47/80   Cervical   TREATMENT DATE: Next MD appt 07/21/24 07/13/24   Exercises:  Sci Fit: level 5 for 10'  Seated cervical side bending stretch: 5 x 20 on L and 2 x 20 on R UE ranger: flexion and abduction: 2 x 10 each Rows: black sport cord: 3 x 10 Shoulder extensions: yellow sport cord: 3 x 10 Unilateral (2 strands) Shoulder scaption raises: yellow sport cord: 3 x 10, single strand R and L Shoulder external rotatioN: yellow band: 2 x 10    PATIENT EDUCATION:  Education details: see above Person educated: Patient Education method: Explanation Education comprehension: verbalized understanding  HOME EXERCISE PROGRAM:   Access Code: [email or web address] URL: [email or web address] Date: 07/11/2024 Prepared by: Raj Blanch  Exercises - Shoulder Flexion Wall Slide with Towel  - 3 x daily - 7 x weekly - 15 reps - Seated Shoulder Flexion AAROM with Pulley Behind  - 3 x daily - 7 x weekly - Standing Shoulder Row with Anchored Resistance  - 1 x daily - 7 x weekly - 2 sets - 10 reps - Shoulder extension with resistance - Neutral  - 1 x daily - 7 x weekly - 2 sets - 10 reps - Seated Cervical Sidebending Stretch  - 1-2 x daily - 7 x weekly - 3-5 reps - 20 sec hold ASSESSMENT:  CLINICAL IMPRESSION: Overall ROM is 95% within normal limits grossly in L shoulder compared to R shoulder. Patient still has weakness in Lshoulder with underlying cervical radiculopathy. Will benefit from continued strengthening to improve strength in L LE  OBJECTIVE IMPAIRMENTS: decreased ROM, decreased strength, hypomobility, increased fascial restrictions, increased muscle spasms, impaired flexibility, impaired UE functional use, postural dysfunction, and pain.   ACTIVITY LIMITATIONS: carrying and sleeping  PARTICIPATION LIMITATIONS: meal prep, cleaning, laundry, driving, shopping, and community activity  PERSONAL FACTORS: Age and Time since onset of injury/illness/exacerbation are also affecting patient's functional outcome.   REHAB POTENTIAL: Good  CLINICAL  DECISION MAKING: Stable/uncomplicated  EVALUATION COMPLEXITY: Moderate   GOALS: Goals reviewed with patient? Yes  SHORT TERM GOALS: No STG established due to POC <4 weeks   LONG TERM GOALS: Target date: 09/01/2024     Patient will be 100% compliant with HEP to self manage symptoms at home  Baseline: initiated 04/14/24 Goal status: progressing  2.  Patient will demo at least 10% improvement on UEFS to improve overall function. Baseline: TBD; 27/80 (05/17/24); 47/80 Goal status: Met 07/07/24  3.  Patient will demo grip strength in L  UE to be >50 lbs to improve ability to hold and carry objects in L hand Baseline: 29 lbs (04/14/24); 28 lbs (05/17/24) Goal status: progressing  4.  Patient will demo L shoulder AROM to be above 160 deg to improve ability to reach in overhead cabinets. Baseline: 120 deg (eval); 135 deg (05/17/24) Goal status: progressing  5.  Pt will report overall max pain of <5/10 in L shoulder and L arm to improve function with L arm Baseline: 10/10 (04/14/24); 6-8/10 at worst (05/17/24) Goal status: progressing  6.  Patient will demo at least UEFS score of >60/80 to improve overall function. Baseline: TBD; 27/80 (05/17/24); 47/80 (07/04/24) Goal status: Revised 07/07/24   PLAN:  PT FREQUENCY: 3x/week for next 6 sessions and then 2x/week for 8 additional sessions.  PT DURATION: 10 weeks  PLANNED INTERVENTIONS: 97164- PT Re-evaluation, 97750- Physical Performance Testing, 97110-Therapeutic exercises, 97530- Therapeutic activity, 97112- Neuromuscular re-education, 97535- Self Care, [id number]- Manual therapy, [id number]- Electrical stimulation (unattended), Patient/Family education, Joint mobilization, Joint manipulation, Spinal manipulation, Spinal mobilization, DME instructions, Cryotherapy, and Moist heat  PLAN FOR NEXT SESSION: Aprogress HEP   Raj LOISE Blanch, PT 07/13/2024, 11:47 AM

## 2024-07-18 ENCOUNTER — Other Ambulatory Visit (HOSPITAL_COMMUNITY): Payer: Self-pay

## 2024-07-18 ENCOUNTER — Telehealth: Payer: Self-pay | Admitting: Pharmacy Technician

## 2024-07-18 ENCOUNTER — Ambulatory Visit

## 2024-07-18 DIAGNOSIS — M79602 Pain in left arm: Secondary | ICD-10-CM

## 2024-07-18 DIAGNOSIS — M6281 Muscle weakness (generalized): Secondary | ICD-10-CM

## 2024-07-18 DIAGNOSIS — M542 Cervicalgia: Secondary | ICD-10-CM

## 2024-07-18 DIAGNOSIS — M25512 Pain in left shoulder: Secondary | ICD-10-CM

## 2024-07-18 NOTE — Telephone Encounter (Signed)
   Pharmacy Patient Advocate Encounter   Received notification from CoverMyMeds that prior authorization for REPATHA  is required/requested.   Insurance verification completed.   The patient is insured through [employer] .   Per test claim: PA required; PA submitted to above mentioned insurance via Latent Key/confirmation #/EOC [id number] Status is pending

## 2024-07-18 NOTE — Telephone Encounter (Signed)
 Pharmacy Patient Advocate Encounter  Received notification from [employer] that Prior Authorization for repatha  has been APPROVED from 07/18/24 to 07/18/25   PA #/Case ID/Reference #: [id number]

## 2024-07-18 NOTE — Therapy (Addendum)
 OUTPATIENT PHYSICAL THERAPY TREATMENT NOTE   Patient Name: [name] MRN: [id number] DOB:1965-10-31, 58 y.o., female Today's Date: 07/18/2024  END OF SESSION:  PT End of Session - 07/18/24 1134     Visit Number 15    Number of Visits 22    Date for Recertification  09/01/24    Authorization Type /  Approved 11 PT visits 04/19/2024 - 07/17/2024; Approved 6 visits 05/17/24-07/15/24 (Order#[id number]); 7 visits (9/26-11/24)    PT Start Time 1125    PT Stop Time 1205    PT Time Calculation (min) 40 min    Activity Tolerance Patient tolerated treatment well    Behavior During Therapy [facility] for tasks assessed/performed                 Past Medical History:  Diagnosis Date   Anemia    CAD (coronary artery disease)    PCI with stent to RCA in 2019   CHF (congestive heart failure) (HCC) 04/2011   EF 15-20% from echo 05/19/11   DOE (dyspnea on exertion)    DVT (deep venous thrombosis) (HCC) 06/2011   LLE   Fatigue    HTN (hypertension)    Hyperlipidemia    Menorrhagia    with iron  deficient anemia   NSTEMI (non-ST elevated myocardial infarction) (HCC) 10/26/2017   NSTEMI (non-ST elevated myocardial infarction) (HCC) 10/27/2017   Obesity    Orthopnea    Type II diabetes mellitus (HCC)    Past Surgical History:  Procedure Laterality Date   CORONARY ANGIOPLASTY WITH STENT PLACEMENT  2013;  10/27/2017   CORONARY BALLOON ANGIOPLASTY N/A 05/26/2022   Procedure: CORONARY BALLOON ANGIOPLASTY;  Surgeon: Elmira Newman PARAS, MD;  Location: [facility];  Service: Cardiovascular;  Laterality: N/A;   CORONARY STENT INTERVENTION N/A 10/27/2017   Procedure: CORONARY STENT INTERVENTION;  Surgeon: Elmira Newman PARAS, MD;  Location: [facility];  Service: Cardiovascular;  Laterality: N/A;   CORONARY STENT INTERVENTION N/A 03/10/2022   Procedure: CORONARY STENT INTERVENTION;  Surgeon: Ladona Heinz, MD;  Location: [facility];  Service: Cardiovascular;  Laterality: N/A;    CORONARY STENT INTERVENTION N/A 03/11/2022   Procedure: CORONARY STENT INTERVENTION;  Surgeon: Elmira Newman PARAS, MD;  Location: [facility];  Service: Cardiovascular;  Laterality: N/A;  aborted   CORONARY STENT INTERVENTION N/A 10/20/2022   Procedure: CORONARY STENT INTERVENTION;  Surgeon: Elmira Newman PARAS, MD;  Location: [facility];  Service: Cardiovascular;  Laterality: N/A;   CORONARY ULTRASOUND/IVUS N/A 05/26/2022   Procedure: Intravascular Ultrasound/IVUS;  Surgeon: Elmira Newman PARAS, MD;  Location: [facility];  Service: Cardiovascular;  Laterality: N/A;   CORONARY ULTRASOUND/IVUS N/A 10/20/2022   Procedure: Intravascular Ultrasound/IVUS;  Surgeon: Elmira Newman PARAS, MD;  [facility]: [facility];  Service: Cardiovascular;  Laterality: N/A;   LEFT HEART CATH AND CORONARY ANGIOGRAPHY N/A 10/27/2017   Procedure: LEFT HEART CATH AND CORONARY ANGIOGRAPHY;  Surgeon: Elmira Newman PARAS, MD;  [facility]: [facility];  Service: Cardiovascular;  Laterality: N/A;   LEFT HEART CATH AND CORONARY ANGIOGRAPHY N/A 11/17/2019   Procedure: LEFT HEART CATH AND CORONARY ANGIOGRAPHY;  Surgeon: Elmira Newman PARAS, MD;  Location: [facility];  Service: Cardiovascular;  Laterality: N/A;   LEFT HEART CATH AND CORONARY ANGIOGRAPHY N/A 03/10/2022   Procedure: LEFT HEART CATH AND CORONARY ANGIOGRAPHY;  Surgeon: Ladona Heinz, MD;  [facility]: [facility];  Service: Cardiovascular;  Laterality: N/A;   LEFT HEART  CATH AND CORONARY ANGIOGRAPHY N/A 07/27/2022   Procedure: LEFT HEART CATH AND CORONARY ANGIOGRAPHY;  Surgeon: Ladona Heinz, MD;  [facility]: [facility];  Service: Cardiovascular;  Laterality: N/A;   LEFT HEART CATH AND CORONARY ANGIOGRAPHY N/A 10/20/2022   Procedure: LEFT HEART CATH AND CORONARY ANGIOGRAPHY;  Surgeon: Elmira Newman PARAS, MD;  [facility]: [facility];  Service: Cardiovascular;  Laterality: N/A;   LEFT HEART CATHETERIZATION WITH CORONARY  ANGIOGRAM N/A 11/17/2011   Procedure: LEFT HEART CATHETERIZATION WITH CORONARY ANGIOGRAM;  Surgeon: Erick JONELLE Ladona, MD;  Location: [facility];  Service: Cardiovascular;  Laterality: N/A;   OVARIAN CYST REMOVAL     TUBAL LIGATION  2006   ULTRASOUND GUIDANCE FOR VASCULAR ACCESS  10/27/2017   Procedure: Ultrasound Guidance For Vascular Access;  Surgeon: Elmira Newman PARAS, MD;  Location: [facility];  Service: Cardiovascular;;   Patient Active Problem List   Diagnosis Date Noted   Coronary artery disease involving native coronary artery of native heart without angina pectoris    Pleuritic pain 07/25/2022   History of non-ST elevation myocardial infarction (NSTEMI)    History of coronary angioplasty with insertion of stent    Mixed hyperlipidemia    Post PTCA 05/26/2022   Chronic iron  deficiency anemia 03/10/2022   Chronic diastolic CHF (congestive heart failure) (HCC) 03/10/2022   Non-insulin  dependent type 2 diabetes mellitus (HCC) 03/10/2022   Morbidly obese (HCC) 08/19/2020   Uncontrolled type 2 diabetes mellitus with hyperglycemia (HCC) 08/19/2020   Unstable angina (HCC) 11/16/2019   Non-ST elevation (NSTEMI) myocardial infarction (HCC) 10/27/2017   OSA (obstructive sleep apnea) 02/21/2013   Atherosclerosis of native coronary artery of native heart with stable angina pectoris 11/18/2011   Dyslipidemia 11/18/2011   Precordial pain 11/17/2011   Abnormal cardiovascular stress test 11/17/2011   DVT (deep venous thrombosis) (HCC) 11/01/2011   Essential hypertension 11/01/2011   Blood loss anemia 11/01/2011   Exertional dyspnea 08/19/2011    PCP: Schuyler Lesser, PA-C  REFERRING PROVIDER: Tobie Tonita POUR, DO  REFERRING DIAG: R20.0 (ICD-10-CM) - Left arm numbness  THERAPY DIAG:  Muscle weakness (generalized)  Acute pain of left shoulder  Pain in left arm  Neck pain  Rationale for Evaluation and Treatment: Rehabilitation  ONSET DATE: 04/10/2024- date of  referral  SUBJECTIVE:                                                                                                                                                                                                         SUBJECTIVE STATEMENT: Pt reports  she didn't do her stretches yesterday. Bcause of the rain she is little stiff in the shoulder. She is reporting onset of L hip pain (glut region) that started couple of days ago that she is most concerned about. Hand dominance: Right  PERTINENT HISTORY:  CHF, CAD, DM  PAIN:  Are you having pain? Yes: NPRS scale: 2/10 Pain location: L side of neck, L arm Pain description: radiating, heavy, tingling Aggravating factors: unsure Relieving factors: unsure  PRECAUTIONS: None  RED FLAGS: None     WEIGHT BEARING RESTRICTIONS: No  FALLS:  Has patient fallen in last 6 months? No  PLOF: Independent  PATIENT GOALS: improve pain    OBJECTIVE:  Note: Objective measures were completed at Evaluation unless otherwise noted.  DIAGNOSTIC FINDINGS:  MRI of Brain: 02/17/24   IMPRESSION: 1. No acute intracranial abnormality. 2. Moderate cerebral white matter disease, nonspecific, but most likely related chronic microvascular ischemic disease. Probable tiny remote left cerebellar infarct.  PATIENT SURVEYS:   COGNITION: Overall cognitive status: Within functional limits for tasks assessed  PALPATION: GH capsule hypomobility grossly   CERVICAL ROM:   Active ROM A/PROM (deg) eval AROM 06/12/24  Flexion 75   Extension 70   Right lateral flexion    Left lateral flexion    Right rotation 75 90  Left rotation 80 60   (Blank rows = not tested)  UPPER EXTREMITY ROM:  Active/Passive ROM Right eval Left eval Left 05/17/24 Left 06/20/24 Left 07/07/24 (after injection)  Shoulder flexion 180/180 120/120 135/140 85 pain (AROM) 145/170  Shoulder abduction 180/180 90/100 130/130  145/170  Shoulder extension       Shoulder adduction        Shoulder extension       Shoulder internal rotation T6 T12     Shoulder external rotation T2 L occiput     Elbow flexion       Elbow extension       Wrist flexion       Wrist extension       Wrist ulnar deviation       Wrist radial deviation       Wrist pronation       Wrist supination        (Blank rows = not tested)  UPPER EXTREMITY MMT:  MMT Right eval Left eval Right 05/17/24 Left 05/17/24 Left 06/20/24 Right 06/20/24  Shoulder flexion 5/5 3+/5      Shoulder extension        Shoulder abduction 5/5 3+/5      Shoulder adduction        Shoulder extension        Shoulder internal rotation 5/5 3+/5      Shoulder external rotation 5/5 3+/5      Middle trapezius        Lower trapezius        Elbow flexion 5/5 3+/5      Elbow extension 5/5 3+/5      Wrist flexion        Wrist extension        Wrist ulnar deviation        Wrist radial deviation        Wrist pronation        Wrist supination        Grip strength 69 25 71 28 14.9 lb 54 lbs   (Blank rows = not tested)   Extreme difficulty/unable (0), Quite a bit of difficulty (1), Moderate difficulty (2), Little difficulty (3),  No difficulty (4) Survey date:  05/17/24 07/07/24  Any of your usual work, household or school activities 1 2  2. Your usual hobbies, recreational/sport activities 0 0   3. Lifting a bag of groceries to waist level 1 2   4. Lifting a bag of groceries above your head 0 1  5. Grooming your hair 2 3  6. Pushing up on your hands (I.e. from bathtub or chair) 3 3  7. Preparing food (I.e. peeling/cutting) 0 2  8. Driving  0 3  9. Vacuuming, sweeping, or raking 1 2  10. Dressing  4 4  11. Doing up buttons 4 4  12. Using tools/appliances 1 2  13. Opening doors 3 4  14. Cleaning  0 2  15. Tying or lacing shoes 3 4  16. Sleeping  2 2  17. Laundering clothes (I.e. washing, ironing, folding) 0 2  18. Opening a jar 0 2  19. Throwing a ball 2 2  20. Carrying a small suitcase with your affected limb.  0 1   Score total:  27/80  47/80   Cervical   TREATMENT DATE: Next MD appt 07/21/24 07/13/24  Exercises:  Sci Fit: level 5 for 10' Supine Piriformis stretch: 5 x 30 R and L Supine hooklying slow eccentric leg lowering: 2 x 10 Holding plate with 5lbs and placing it in [facility] cabinet and lowering it down: 15x Lifting 5lbs from h. Abducted position to bringing it in front: 10x (mimicking grabing things from floor of back seat and bringing it in front) Seated cervical side bending stretch: 3 x 30  Reclined seated: OH press: 5lbs 3 x 5 R and L   PATIENT EDUCATION:  Education details: see above Person educated: Patient Education method: Explanation Education comprehension: verbalized understanding  HOME EXERCISE PROGRAM:   Access Code: [email or web address] URL: [email or web address] Date: 07/11/2024 Prepared by: Raj Blanch  Exercises - Shoulder Flexion Wall Slide with Towel  - 3 x daily - 7 x weekly - 15 reps - Seated Shoulder Flexion AAROM with Pulley Behind  - 3 x daily - 7 x weekly - Standing Shoulder Row with Anchored Resistance  - 1 x daily - 7 x weekly - 2 sets - 10 reps - Shoulder extension with resistance - Neutral  - 1 x daily - 7 x weekly - 2 sets - 10 reps - Seated Cervical Sidebending Stretch  - 1-2 x daily - 7 x weekly - 3-5 reps - 20 sec hold ASSESSMENT:  CLINICAL IMPRESSION: Overall pt is able to lift more weights OH but reports of fatigue which leads to compensation from neck and mm spasms. Pt requires sets/reps modification to avoid pain in neck and stretching instructions to manage pain throughout.  OBJECTIVE IMPAIRMENTS: decreased ROM, decreased strength, hypomobility, increased fascial restrictions, increased muscle spasms, impaired flexibility, impaired UE functional use, postural dysfunction, and pain.   ACTIVITY LIMITATIONS: carrying and sleeping  PARTICIPATION LIMITATIONS: meal prep, cleaning, laundry, driving, shopping, and community activity  PERSONAL  FACTORS: Age and Time since onset of injury/illness/exacerbation are also affecting patient's functional outcome.   REHAB POTENTIAL: Good  CLINICAL DECISION MAKING: Stable/uncomplicated  EVALUATION COMPLEXITY: Moderate   GOALS: Goals reviewed with patient? Yes  SHORT TERM GOALS: No STG established due to POC <4 weeks   LONG TERM GOALS: Target date: 09/01/2024     Patient will be 100% compliant with HEP to self manage symptoms at home  Baseline: initiated 04/14/24 Goal status: progressing  2.  Patient will demo  at least 10% improvement on UEFS to improve overall function. Baseline: TBD; 27/80 (05/17/24); 47/80 Goal status: Met 07/07/24  3.  Patient will demo grip strength in L UE to be >50 lbs to improve ability to hold and carry objects in L hand Baseline: 29 lbs (04/14/24); 28 lbs (05/17/24) Goal status: progressing  4.  Patient will demo L shoulder AROM to be above 160 deg to improve ability to reach in overhead cabinets. Baseline: 120 deg (eval); 135 deg (05/17/24) Goal status: progressing  5.  Pt will report overall max pain of <5/10 in L shoulder and L arm to improve function with L arm Baseline: 10/10 (04/14/24); 6-8/10 at worst (05/17/24) Goal status: progressing  6.  Patient will demo at least UEFS score of >60/80 to improve overall function. Baseline: TBD; 27/80 (05/17/24); 47/80 (07/04/24) Goal status: Revised 07/07/24   PLAN:  PT FREQUENCY: 3x/week for next 6 sessions and then 2x/week for 8 additional sessions.  PT DURATION: 10 weeks  PLANNED INTERVENTIONS: [id number]- PT Re-evaluation, 97750- Physical Performance Testing, 97110-Therapeutic exercises, 97530- Therapeutic activity, [id number]- Neuromuscular re-education, 97535- Self Care, [id number]- Manual therapy, [id number]- Electrical stimulation (unattended), Patient/Family education, Joint mobilization, Joint manipulation, Spinal manipulation, Spinal mobilization, DME instructions, Cryotherapy, and Moist heat  PLAN FOR NEXT  SESSION: Aprogress HEP   Raj LOISE Blanch, PT 07/18/2024, 4:38 PM

## 2024-07-19 ENCOUNTER — Ambulatory Visit

## 2024-07-21 ENCOUNTER — Ambulatory Visit

## 2024-07-21 ENCOUNTER — Ambulatory Visit: Admitting: Sports Medicine

## 2024-07-24 ENCOUNTER — Ambulatory Visit: Attending: Neurology

## 2024-07-24 DIAGNOSIS — M79602 Pain in left arm: Secondary | ICD-10-CM | POA: Diagnosis present

## 2024-07-24 DIAGNOSIS — M6281 Muscle weakness (generalized): Secondary | ICD-10-CM | POA: Insufficient documentation

## 2024-07-24 DIAGNOSIS — M25512 Pain in left shoulder: Secondary | ICD-10-CM | POA: Diagnosis present

## 2024-07-24 DIAGNOSIS — M542 Cervicalgia: Secondary | ICD-10-CM | POA: Insufficient documentation

## 2024-07-24 NOTE — Therapy (Signed)
 OUTPATIENT PHYSICAL THERAPY TREATMENT NOTE   Patient Name: [name] MRN: [id number] DOB:August 21, 1966, 58 y.o., female Today's Date: 07/24/2024  END OF SESSION:  PT End of Session - 07/24/24 1110     Visit Number 16    Number of Visits 22    Date for Recertification  09/01/24    Authorization Type /  Approved 11 PT visits 04/19/2024 - 07/17/2024; Approved 6 visits 05/17/24-07/15/24 (Order#[id number]); 7 visits (9/26-11/24)    PT Start Time 1105    PT Stop Time 1145    PT Time Calculation (min) 40 min    Activity Tolerance Patient tolerated treatment well    Behavior During Therapy [facility] for tasks assessed/performed                 Past Medical History:  Diagnosis Date   Anemia    CAD (coronary artery disease)    PCI with stent to RCA in 2019   CHF (congestive heart failure) (HCC) 04/2011   EF 15-20% from echo 05/19/11   DOE (dyspnea on exertion)    DVT (deep venous thrombosis) (HCC) 06/2011   LLE   Fatigue    HTN (hypertension)    Hyperlipidemia    Menorrhagia    with iron  deficient anemia   NSTEMI (non-ST elevated myocardial infarction) (HCC) 10/26/2017   NSTEMI (non-ST elevated myocardial infarction) (HCC) 10/27/2017   Obesity    Orthopnea    Type II diabetes mellitus (HCC)    Past Surgical History:  Procedure Laterality Date   CORONARY ANGIOPLASTY WITH STENT PLACEMENT  2013;  10/27/2017   CORONARY BALLOON ANGIOPLASTY N/A 05/26/2022   Procedure: CORONARY BALLOON ANGIOPLASTY;  Surgeon: Elmira Newman PARAS, MD;  Location: [facility];  Service: Cardiovascular;  Laterality: N/A;   CORONARY STENT INTERVENTION N/A 10/27/2017   Procedure: CORONARY STENT INTERVENTION;  Surgeon: Elmira Newman PARAS, MD;  Location: [facility];  Service: Cardiovascular;  Laterality: N/A;   CORONARY STENT INTERVENTION N/A 03/10/2022   Procedure: CORONARY STENT INTERVENTION;  Surgeon: Ladona Heinz, MD;  Location: [facility];  Service: Cardiovascular;  Laterality: N/A;    CORONARY STENT INTERVENTION N/A 03/11/2022   Procedure: CORONARY STENT INTERVENTION;  Surgeon: Elmira Newman PARAS, MD;  Location: [facility];  Service: Cardiovascular;  Laterality: N/A;  aborted   CORONARY STENT INTERVENTION N/A 10/20/2022   Procedure: CORONARY STENT INTERVENTION;  Surgeon: Elmira Newman PARAS, MD;  Location: [facility];  Service: Cardiovascular;  Laterality: N/A;   CORONARY ULTRASOUND/IVUS N/A 05/26/2022   Procedure: Intravascular Ultrasound/IVUS;  Surgeon: Elmira Newman PARAS, MD;  Location: [facility];  Service: Cardiovascular;  Laterality: N/A;   CORONARY ULTRASOUND/IVUS N/A 10/20/2022   Procedure: Intravascular Ultrasound/IVUS;  Surgeon: Elmira Newman PARAS, MD;  Location: [facility];  Service: Cardiovascular;  Laterality: N/A;   LEFT HEART CATH AND CORONARY ANGIOGRAPHY N/A 10/27/2017   Procedure: LEFT HEART CATH AND CORONARY ANGIOGRAPHY;  Surgeon: Elmira Newman PARAS, MD;  [facility]: [facility];  Service: Cardiovascular;  Laterality: N/A;   LEFT HEART CATH AND CORONARY ANGIOGRAPHY N/A 11/17/2019   Procedure: LEFT HEART CATH AND CORONARY ANGIOGRAPHY;  Surgeon: Elmira Newman PARAS, MD;  Location: [facility];  Service: Cardiovascular;  Laterality: N/A;   LEFT HEART CATH AND CORONARY ANGIOGRAPHY N/A 03/10/2022   Procedure: LEFT HEART CATH AND CORONARY ANGIOGRAPHY;  Surgeon: Ladona Heinz, MD;  [facility]: [facility];  Service: Cardiovascular;  Laterality: N/A;   LEFT HEART  CATH AND CORONARY ANGIOGRAPHY N/A 07/27/2022   Procedure: LEFT HEART CATH AND CORONARY ANGIOGRAPHY;  Surgeon: Ladona Heinz, MD;  [facility]: [facility];  Service: Cardiovascular;  Laterality: N/A;   LEFT HEART CATH AND CORONARY ANGIOGRAPHY N/A 10/20/2022   Procedure: LEFT HEART CATH AND CORONARY ANGIOGRAPHY;  Surgeon: Elmira Newman PARAS, MD;  [facility]: [facility];  Service: Cardiovascular;  Laterality: N/A;   LEFT HEART CATHETERIZATION WITH CORONARY  ANGIOGRAM N/A 11/17/2011   Procedure: LEFT HEART CATHETERIZATION WITH CORONARY ANGIOGRAM;  Surgeon: Erick JONELLE Ladona, MD;  Location: [facility] C[facility];  Service: Cardiovascular;  Laterality: N/A;   OVARIAN CYST REMOVAL     TUBAL LIGATION  2006   ULTRASOUND GUIDANCE FOR VASCULAR ACCESS  10/27/2017   Procedure: Ultrasound Guidance For Vascular Access;  Surgeon: Elmira Newman PARAS, MD;  Location: [facility];  Service: Cardiovascular;;   Patient Active Problem List   Diagnosis Date Noted   Coronary artery disease involving native coronary artery of native heart without angina pectoris    Pleuritic pain 07/25/2022   History of non-ST elevation myocardial infarction (NSTEMI)    History of coronary angioplasty with insertion of stent    Mixed hyperlipidemia    Post PTCA 05/26/2022   Chronic iron  deficiency anemia 03/10/2022   Chronic diastolic CHF (congestive heart failure) (HCC) 03/10/2022   Non-insulin  dependent type 2 diabetes mellitus (HCC) 03/10/2022   Morbidly obese (HCC) 08/19/2020   Uncontrolled type 2 diabetes mellitus with hyperglycemia (HCC) 08/19/2020   Unstable angina (HCC) 11/16/2019   Non-ST elevation (NSTEMI) myocardial infarction (HCC) 10/27/2017   OSA (obstructive sleep apnea) 02/21/2013   Atherosclerosis of native coronary artery of native heart with stable angina pectoris 11/18/2011   Dyslipidemia 11/18/2011   Precordial pain 11/17/2011   Abnormal cardiovascular stress test 11/17/2011   DVT (deep venous thrombosis) (HCC) 11/01/2011   Essential hypertension 11/01/2011   Blood loss anemia 11/01/2011   Exertional dyspnea 08/19/2011    PCP: Schuyler Lesser, PA-C  REFERRING PROVIDER: Tobie Tonita POUR, DO  REFERRING DIAG: R20.0 (ICD-10-CM) - Left arm numbness  THERAPY DIAG:  Muscle weakness (generalized)  Acute pain of left shoulder  Pain in left arm  Neck pain  Rationale for Evaluation and Treatment: Rehabilitation  ONSET DATE: 04/10/2024- date of  referral  SUBJECTIVE:                                                                                                                                                                                                         SUBJECTIVE STATEMENT: Pt reports  shoulder is feeling better but Left side of neck/upper trap is feeling very stiff.  Hand dominance: Right  PERTINENT HISTORY:  CHF, CAD, DM  PAIN:  Are you having pain? Yes: NPRS scale: 2/10 Pain location: L side of neck, L arm Pain description: radiating, heavy, tingling Aggravating factors: unsure Relieving factors: unsure  PRECAUTIONS: None  RED FLAGS: None     WEIGHT BEARING RESTRICTIONS: No  FALLS:  Has patient fallen in last 6 months? No  PLOF: Independent  PATIENT GOALS: improve pain    OBJECTIVE:  Note: Objective measures were completed at Evaluation unless otherwise noted.  DIAGNOSTIC FINDINGS:  MRI of Brain: 02/17/24   IMPRESSION: 1. No acute intracranial abnormality. 2. Moderate cerebral white matter disease, nonspecific, but most likely related chronic microvascular ischemic disease. Probable tiny remote left cerebellar infarct.  PATIENT SURVEYS:   COGNITION: Overall cognitive status: Within functional limits for tasks assessed  PALPATION: GH capsule hypomobility grossly   CERVICAL ROM:   Active ROM A/PROM (deg) eval AROM 06/12/24  Flexion 75   Extension 70   Right lateral flexion    Left lateral flexion    Right rotation 75 90  Left rotation 80 60   (Blank rows = not tested)  UPPER EXTREMITY ROM:  Active/Passive ROM Right eval Left eval Left 05/17/24 Left 06/20/24 Left 07/07/24 (after injection) Left 07/24/24 active  Shoulder flexion 180/180 120/120 135/140 85 pain (AROM) 145/170 160   Shoulder abduction 180/180 90/100 130/130  145/170 160  Shoulder extension        Shoulder adduction        Shoulder extension        Shoulder internal rotation T6 T12    T12  Shoulder external  rotation T2 L occiput      Elbow flexion        Elbow extension        Wrist flexion        Wrist extension        Wrist ulnar deviation        Wrist radial deviation        Wrist pronation        Wrist supination         (Blank rows = not tested)  UPPER EXTREMITY MMT:  MMT Right eval Left eval Left  Left 05/17/24 Left 06/20/24 Right 06/20/24 Left 07/24/24  Shoulder flexion 5/5 3+/5     4  Shoulder extension         Shoulder abduction 5/5 3+/5     4  Shoulder adduction         Shoulder extension         Shoulder internal rotation 5/5 3+/5     4  Shoulder external rotation 5/5 3+/5     4  Middle trapezius         Lower trapezius         Elbow flexion 5/5 3+/5     5  Elbow extension 5/5 3+/5     4  Wrist flexion         Wrist extension         Wrist ulnar deviation         Wrist radial deviation         Wrist pronation         Wrist supination         Grip strength 69 25 71 28 14.9 lb 54 lbs 62 lbs (63lbs on R UE)   (Blank rows =  not tested)   Extreme difficulty/unable (0), Quite a bit of difficulty (1), Moderate difficulty (2), Little difficulty (3), No difficulty (4) Survey date:  05/17/24 07/07/24  Any of your usual work, household or school activities 1 2  2. Your usual hobbies, recreational/sport activities 0 0   3. Lifting a bag of groceries to waist level 1 2   4. Lifting a bag of groceries above your head 0 1  5. Grooming your hair 2 3  6. Pushing up on your hands (I.e. from bathtub or chair) 3 3  7. Preparing food (I.e. peeling/cutting) 0 2  8. Driving  0 3  9. Vacuuming, sweeping, or raking 1 2  10. Dressing  4 4  11. Doing up buttons 4 4  12. Using tools/appliances 1 2  13. Opening doors 3 4  14. Cleaning  0 2  15. Tying or lacing shoes 3 4  16. Sleeping  2 2  17. Laundering clothes (I.e. washing, ironing, folding) 0 2  18. Opening a jar 0 2  19. Throwing a ball 2 2  20. Carrying a small suitcase with your affected limb.  0 1  Score total:  27/80   47/80   Cervical   TREATMENT DATE: Next MD appt 08/03/24 07/13/24  Exercises:  Sci Fit: level 6 for 10' Seated chest press and OH press: 4lbs plyoball 2 x 10 bil Lateral raises: 2lbs R and L, 2 x 10 Biceps curls: 8lbs R and L, 2 x 10 Rows: black sport band: 2 x 10 Shoulder extensions: red sports band: 2 x 10 Pt issued black theraband to do rows at home. Pt asked to progress shoulder extension with green theraband at home (already issued) Seated cervical flexion stretch: 3 x 30 R and L Shoulder pulleys: 5'- focused more on flexion portion as that is restricting towards the end range   PATIENT EDUCATION:  Education details: see above Person educated: Patient Education method: Explanation Education comprehension: verbalized understanding  HOME EXERCISE PROGRAM:   Access Code: [email or web address] URL: [email or web address] Date: 07/11/2024 Prepared by: Raj Blanch  Exercises - Shoulder Flexion Wall Slide with Towel  - 3 x daily - 7 x weekly - 15 reps - Seated Shoulder Flexion AAROM with Pulley Behind  - 3 x daily - 7 x weekly - Standing Shoulder Row with Anchored Resistance  - 1 x daily - 7 x weekly - 2 sets - 10 reps - Shoulder extension with resistance - Neutral  - 1 x daily - 7 x weekly - 2 sets - 10 reps - Seated Cervical Sidebending Stretch  - 1-2 x daily - 7 x weekly - 3-5 reps - 20 sec hold ASSESSMENT:  CLINICAL IMPRESSION: Pt's ROM and strength both have improve since evaluation. Pt continues to report neck pain that is most limiting factor right now. Grip strength is WFL of contralateral side.   OBJECTIVE IMPAIRMENTS: decreased ROM, decreased strength, hypomobility, increased fascial restrictions, increased muscle spasms, impaired flexibility, impaired UE functional use, postural dysfunction, and pain.   ACTIVITY LIMITATIONS: carrying and sleeping  PARTICIPATION LIMITATIONS: meal prep, cleaning, laundry, driving, shopping, and community activity  PERSONAL  FACTORS: Age and Time since onset of injury/illness/exacerbation are also affecting patient's functional outcome.   REHAB POTENTIAL: Good  CLINICAL DECISION MAKING: Stable/uncomplicated  EVALUATION COMPLEXITY: Moderate   GOALS: Goals reviewed with patient? Yes  SHORT TERM GOALS: No STG established due to POC <4 weeks   LONG TERM GOALS: Target date: 09/01/2024  Patient will be 100% compliant with HEP to self manage symptoms at home  Baseline: initiated 04/14/24 Goal status: progressing  2.  Patient will demo at least 10% improvement on UEFS to improve overall function. Baseline: TBD; 27/80 (05/17/24); 47/80 Goal status: Met 07/07/24  3.  Patient will demo grip strength in L UE to be >50 lbs to improve ability to hold and carry objects in L hand Baseline: 29 lbs (04/14/24); 28 lbs (05/17/24) Goal status: progressing  4.  Patient will demo L shoulder AROM to be above 160 deg to improve ability to reach in overhead cabinets. Baseline: 120 deg (eval); 135 deg (05/17/24) Goal status: progressing  5.  Pt will report overall max pain of <5/10 in L shoulder and L arm to improve function with L arm Baseline: 10/10 (04/14/24); 6-8/10 at worst (05/17/24) Goal status: progressing  6.  Patient will demo at least UEFS score of >60/80 to improve overall function. Baseline: TBD; 27/80 (05/17/24); 47/80 (07/04/24) Goal status: Revised 07/07/24   PLAN:  PT FREQUENCY: 3x/week for next 6 sessions and then 2x/week for 8 additional sessions.  PT DURATION: 10 weeks  PLANNED INTERVENTIONS: 97164- PT Re-evaluation, 97750- Physical Performance Testing, 97110-Therapeutic exercises, 97530- Therapeutic activity, 97112- Neuromuscular re-education, 97535- Self Care, [id number]- Manual therapy, [id number]- Electrical stimulation (unattended), Patient/Family education, Joint mobilization, Joint manipulation, Spinal manipulation, Spinal mobilization, DME instructions, Cryotherapy, and Moist heat  PLAN FOR NEXT  SESSION: Aprogress HEP   Raj LOISE Blanch, PT 07/24/2024, 11:11 AM

## 2024-07-26 ENCOUNTER — Ambulatory Visit

## 2024-07-26 ENCOUNTER — Ambulatory Visit: Admitting: Orthopedic Surgery

## 2024-07-27 ENCOUNTER — Other Ambulatory Visit (HOSPITAL_COMMUNITY): Payer: Self-pay

## 2024-07-27 ENCOUNTER — Other Ambulatory Visit: Payer: Self-pay | Admitting: Cardiology

## 2024-07-27 DIAGNOSIS — E785 Hyperlipidemia, unspecified: Secondary | ICD-10-CM

## 2024-07-27 DIAGNOSIS — I25118 Atherosclerotic heart disease of native coronary artery with other forms of angina pectoris: Secondary | ICD-10-CM

## 2024-07-27 DIAGNOSIS — E782 Mixed hyperlipidemia: Secondary | ICD-10-CM

## 2024-07-27 MED ORDER — REPATHA SURECLICK 140 MG/ML ~~LOC~~ SOAJ
140.0000 mg | SUBCUTANEOUS | 11 refills | Status: DC
  Filled 2024-07-27: qty 2, 28d supply, fill #0

## 2024-07-27 NOTE — Telephone Encounter (Signed)
*  STAT* If patient is at the pharmacy, call can be transferred to refill team.   1. Which medications need to be refilled? (please list name of each medication and dose if known) Evolocumab  (REPATHA  SURECLICK) 140 MG/ML SOAJ    2. Would you like to learn more about the convenience, safety, & potential cost savings by using the [facility]?    3. Are you open to using the [facility] (Type [facility]. ).   4. Which pharmacy/location (including street and city if local pharmacy) is medication to be sent to? [id number] - [place], [place] - [address] AT [address]    5. Do they need a 30 day or 90 day supply? 90 day

## 2024-07-28 ENCOUNTER — Other Ambulatory Visit (HOSPITAL_COMMUNITY): Payer: Self-pay

## 2024-07-28 ENCOUNTER — Ambulatory Visit

## 2024-07-28 MED ORDER — REPATHA SURECLICK 140 MG/ML ~~LOC~~ SOAJ: 140.0000 mg | SUBCUTANEOUS | 3 refills | Status: AC

## 2024-07-31 ENCOUNTER — Ambulatory Visit

## 2024-07-31 DIAGNOSIS — M6281 Muscle weakness (generalized): Secondary | ICD-10-CM | POA: Diagnosis not present

## 2024-07-31 DIAGNOSIS — M542 Cervicalgia: Secondary | ICD-10-CM

## 2024-07-31 DIAGNOSIS — M79602 Pain in left arm: Secondary | ICD-10-CM

## 2024-07-31 DIAGNOSIS — M25512 Pain in left shoulder: Secondary | ICD-10-CM

## 2024-07-31 NOTE — Therapy (Signed)
 OUTPATIENT PHYSICAL THERAPY TREATMENT NOTE   Patient Name: [name] MRN: [id number] DOB:[birth year], 58 y.o., female Today's Date: 07/31/2024  END OF SESSION:  PT End of Session - 07/31/24 1405     Visit Number 17    Number of Visits 22    Date for Recertification  09/01/24    Authorization Type /  Approved 11 PT visits 04/19/2024 - 07/17/2024; Approved 6 visits 05/17/24-07/15/24 (Order#[id number]); 7 visits (9/26-11/24)    PT Start Time 1100    PT Stop Time 1145    PT Time Calculation (min) 45 min    Activity Tolerance Patient tolerated treatment well    Behavior During Therapy [facility] for tasks assessed/performed                 Past Medical History:  Diagnosis Date   Anemia    CAD (coronary artery disease)    PCI with stent to RCA in 2019   CHF (congestive heart failure) (HCC) 04/2011   EF 15-20% from echo 05/19/11   DOE (dyspnea on exertion)    DVT (deep venous thrombosis) (HCC) 06/2011   LLE   Fatigue    HTN (hypertension)    Hyperlipidemia    Menorrhagia    with iron  deficient anemia   NSTEMI (non-ST elevated myocardial infarction) (HCC) 10/26/2017   NSTEMI (non-ST elevated myocardial infarction) (HCC) 10/27/2017   Obesity    Orthopnea    Type II diabetes mellitus (HCC)    Past Surgical History:  Procedure Laterality Date   CORONARY ANGIOPLASTY WITH STENT PLACEMENT  2013;  10/27/2017   CORONARY BALLOON ANGIOPLASTY N/A 05/26/2022   Procedure: CORONARY BALLOON ANGIOPLASTY;  Surgeon: Elmira Newman PARAS, MD;  Location: [facility];  Service: Cardiovascular;  Laterality: N/A;   CORONARY STENT INTERVENTION N/A 10/27/2017   Procedure: CORONARY STENT INTERVENTION;  Surgeon: Elmira Newman PARAS, MD;  Location: [facility];  Service: Cardiovascular;  Laterality: N/A;   CORONARY STENT INTERVENTION N/A 03/10/2022   Procedure: CORONARY STENT INTERVENTION;  Surgeon: Ladona Heinz, MD;  Location: [facility];  Service: Cardiovascular;  Laterality: N/A;    CORONARY STENT INTERVENTION N/A 03/11/2022   Procedure: CORONARY STENT INTERVENTION;  Surgeon: Elmira Newman PARAS, MD;  Location: [facility];  Service: Cardiovascular;  Laterality: N/A;  aborted   CORONARY STENT INTERVENTION N/A 10/20/2022   Procedure: CORONARY STENT INTERVENTION;  Surgeon: Elmira Newman PARAS, MD;  Location: [facility];  Service: Cardiovascular;  Laterality: N/A;   CORONARY ULTRASOUND/IVUS N/A 05/26/2022   Procedure: Intravascular Ultrasound/IVUS;  Surgeon: Elmira Newman PARAS, MD;  Location: [facility];  Service: Cardiovascular;  Laterality: N/A;   CORONARY ULTRASOUND/IVUS N/A 10/20/2022   Procedure: Intravascular Ultrasound/IVUS;  Surgeon: Elmira Newman PARAS, MD;  Location: [facility];  Service: Cardiovascular;  Laterality: N/A;   LEFT HEART CATH AND CORONARY ANGIOGRAPHY N/A 10/27/2017   Procedure: LEFT HEART CATH AND CORONARY ANGIOGRAPHY;  Surgeon: Elmira Newman PARAS, MD;  [facility]: [facility];  Service: Cardiovascular;  Laterality: N/A;   LEFT HEART CATH AND CORONARY ANGIOGRAPHY N/A 11/17/2019   Procedure: LEFT HEART CATH AND CORONARY ANGIOGRAPHY;  Surgeon: Elmira Newman PARAS, MD;  Location: [facility];  Service: Cardiovascular;  Laterality: N/A;   LEFT HEART CATH AND CORONARY ANGIOGRAPHY N/A 03/10/2022   Procedure: LEFT HEART CATH AND CORONARY ANGIOGRAPHY;  Surgeon: Ladona Heinz, MD;  [facility]: [facility];  Service: Cardiovascular;  Laterality: N/A;   LEFT HEART  CATH AND CORONARY ANGIOGRAPHY N/A 07/27/2022   Procedure: LEFT HEART CATH AND CORONARY ANGIOGRAPHY;  Surgeon: Ladona Heinz, MD;  [facility]: [facility];  Service: Cardiovascular;  Laterality: N/A;   LEFT HEART CATH AND CORONARY ANGIOGRAPHY N/A 10/20/2022   Procedure: LEFT HEART CATH AND CORONARY ANGIOGRAPHY;  Surgeon: Elmira Newman PARAS, MD;  [facility]: [facility];  Service: Cardiovascular;  Laterality: N/A;   LEFT HEART CATHETERIZATION WITH CORONARY  ANGIOGRAM N/A 11/17/2011   Procedure: LEFT HEART CATHETERIZATION WITH CORONARY ANGIOGRAM;  Surgeon: Erick JONELLE Ladona, MD;  Location: [facility] C[facility];  Service: Cardiovascular;  Laterality: N/A;   OVARIAN CYST REMOVAL     TUBAL LIGATION  2006   ULTRASOUND GUIDANCE FOR VASCULAR ACCESS  10/27/2017   Procedure: Ultrasound Guidance For Vascular Access;  Surgeon: Elmira Newman PARAS, MD;  Location: [facility];  Service: Cardiovascular;;   Patient Active Problem List   Diagnosis Date Noted   Coronary artery disease involving native coronary artery of native heart without angina pectoris    Pleuritic pain 07/25/2022   History of non-ST elevation myocardial infarction (NSTEMI)    History of coronary angioplasty with insertion of stent    Mixed hyperlipidemia    Post PTCA 05/26/2022   Chronic iron  deficiency anemia 03/10/2022   Chronic diastolic CHF (congestive heart failure) (HCC) 03/10/2022   Non-insulin  dependent type 2 diabetes mellitus (HCC) 03/10/2022   Morbidly obese (HCC) 08/19/2020   Uncontrolled type 2 diabetes mellitus with hyperglycemia (HCC) 08/19/2020   Unstable angina (HCC) 11/16/2019   Non-ST elevation (NSTEMI) myocardial infarction (HCC) 10/27/2017   OSA (obstructive sleep apnea) 02/21/2013   Atherosclerosis of native coronary artery of native heart with stable angina pectoris 11/18/2011   Dyslipidemia 11/18/2011   Precordial pain 11/17/2011   Abnormal cardiovascular stress test 11/17/2011   DVT (deep venous thrombosis) (HCC) 11/01/2011   Essential hypertension 11/01/2011   Blood loss anemia 11/01/2011   Exertional dyspnea 08/19/2011    PCP: Schuyler Lesser, PA-C  REFERRING PROVIDER: Tobie Tonita POUR, DO  REFERRING DIAG: R20.0 (ICD-10-CM) - Left arm numbness  THERAPY DIAG:  Muscle weakness (generalized)  Acute pain of left shoulder  Pain in left arm  Neck pain  Rationale for Evaluation and Treatment: Rehabilitation  ONSET DATE: 04/10/2024- date of  referral  SUBJECTIVE:                                                                                                                                                                                                         SUBJECTIVE STATEMENT: Pt reports  increased neck pain on left side. It was difficult to sleep last night. On Sat and Sun, she worked on her laptop for about 3 hours each day. She was using laptop on her couch or in bed. Hand dominance: Right  PERTINENT HISTORY:  CHF, CAD, DM  PAIN:  Are you having pain? Yes: NPRS scale: 5-6/10 Pain location: L side of neck, L arm Pain description: radiating, heavy, tingling Aggravating factors: unsure Relieving factors: unsure  PRECAUTIONS: None  RED FLAGS: None     WEIGHT BEARING RESTRICTIONS: No  FALLS:  Has patient fallen in last 6 months? No  PLOF: Independent  PATIENT GOALS: improve pain    OBJECTIVE:  Note: Objective measures were completed at Evaluation unless otherwise noted.  DIAGNOSTIC FINDINGS:  MRI of Brain: 02/17/24   IMPRESSION: 1. No acute intracranial abnormality. 2. Moderate cerebral white matter disease, nonspecific, but most likely related chronic microvascular ischemic disease. Probable tiny remote left cerebellar infarct.  PATIENT SURVEYS:   COGNITION: Overall cognitive status: Within functional limits for tasks assessed  PALPATION: GH capsule hypomobility grossly   CERVICAL ROM:   Active ROM A/PROM (deg) eval AROM 06/12/24  Flexion 75   Extension 70   Right lateral flexion    Left lateral flexion    Right rotation 75 90  Left rotation 80 60   (Blank rows = not tested)  UPPER EXTREMITY ROM:  Active/Passive ROM Right eval Left eval Left 05/17/24 Left 06/20/24 Left 07/07/24 (after injection) Left 07/24/24 active  Shoulder flexion 180/180 120/120 135/140 85 pain (AROM) 145/170 160   Shoulder abduction 180/180 90/100 130/130  145/170 160  Shoulder extension        Shoulder  adduction        Shoulder extension        Shoulder internal rotation T6 T12    T12  Shoulder external rotation T2 L occiput      Elbow flexion        Elbow extension        Wrist flexion        Wrist extension        Wrist ulnar deviation        Wrist radial deviation        Wrist pronation        Wrist supination         (Blank rows = not tested)  UPPER EXTREMITY MMT:  MMT Right eval Left eval Left  Left 05/17/24 Left 06/20/24 Right 06/20/24 Left 07/24/24  Shoulder flexion 5/5 3+/5     4  Shoulder extension         Shoulder abduction 5/5 3+/5     4  Shoulder adduction         Shoulder extension         Shoulder internal rotation 5/5 3+/5     4  Shoulder external rotation 5/5 3+/5     4  Middle trapezius         Lower trapezius         Elbow flexion 5/5 3+/5     5  Elbow extension 5/5 3+/5     4  Wrist flexion         Wrist extension         Wrist ulnar deviation         Wrist radial deviation         Wrist pronation         Wrist supination  Grip strength 69 25 71 28 14.9 lb 54 lbs 62 lbs (63lbs on R UE)   (Blank rows = not tested)   Extreme difficulty/unable (0), Quite a bit of difficulty (1), Moderate difficulty (2), Little difficulty (3), No difficulty (4) Survey date:  05/17/24 07/07/24  Any of your usual work, household or school activities 1 2  2. Your usual hobbies, recreational/sport activities 0 0   3. Lifting a bag of groceries to waist level 1 2   4. Lifting a bag of groceries above your head 0 1  5. Grooming your hair 2 3  6. Pushing up on your hands (I.e. from bathtub or chair) 3 3  7. Preparing food (I.e. peeling/cutting) 0 2  8. Driving  0 3  9. Vacuuming, sweeping, or raking 1 2  10. Dressing  4 4  11. Doing up buttons 4 4  12. Using tools/appliances 1 2  13. Opening doors 3 4  14. Cleaning  0 2  15. Tying or lacing shoes 3 4  16. Sleeping  2 2  17. Laundering clothes (I.e. washing, ironing, folding) 0 2  18. Opening a jar 0 2  19.  Throwing a ball 2 2  20. Carrying a small suitcase with your affected limb.  0 1  Score total:  27/80  47/80   Cervical   TREATMENT DATE: Next MD appt 08/03/24 07/31/24 Attempted SciFit for level 1 for 5' for warm up but patient reported increased in neck pain after about 3' so it was stopped Self care patient education: - [employer] fast streamer education - Educated desktop is better than laptop when doing extensive computer work as she will be able to manage her posture better. - Discussed following postures:  Top of the screen should be at eye level Screen should be no more than 24 from her eyes Ears over shoudlers for proper cervical alignment Elbows to side and at about 90 deg We discussed putting laptop on box in front of her and using wireless keyboard to free her hand to maintain good shoulder/elbow/wrist alignment Taking frequent breaks to stretch for pain relief  Performed manual stretching of all scalenes, levator scapulae, upper trap bil PATIENT EDUCATION:  Education details: see above Person educated: Patient Education method: Explanation Education comprehension: verbalized understanding  HOME EXERCISE PROGRAM:   Access Code: [email or web address] URL: [email or web address] Date: 07/11/2024 Prepared by: Raj Blanch  Exercises - Shoulder Flexion Wall Slide with Towel  - 3 x daily - 7 x weekly - 15 reps - Seated Shoulder Flexion AAROM with Pulley Behind  - 3 x daily - 7 x weekly - Standing Shoulder Row with Anchored Resistance  - 1 x daily - 7 x weekly - 2 sets - 10 reps - Shoulder extension with resistance - Neutral  - 1 x daily - 7 x weekly - 2 sets - 10 reps - Seated Cervical Sidebending Stretch  - 1-2 x daily - 7 x weekly - 3-5 reps - 20 sec hold ASSESSMENT:  CLINICAL IMPRESSION: Pt continues to report neck pain despite being compliant with HEP and HEP providing short term pain relief. Her pain may be flared up or aggravated due to poor posture with  prolonged computer and improper ergonomics (using laptop in bed or couch) for prolonged periods of time. Pt was educated on proper computer ergonomics and will need few more sessions to see if this is helping her reduce her pain.   OBJECTIVE IMPAIRMENTS: decreased ROM, decreased strength, hypomobility, increased fascial restrictions,  increased muscle spasms, impaired flexibility, impaired UE functional use, postural dysfunction, and pain.   ACTIVITY LIMITATIONS: carrying and sleeping  PARTICIPATION LIMITATIONS: meal prep, cleaning, laundry, driving, shopping, and community activity  PERSONAL FACTORS: Age and Time since onset of injury/illness/exacerbation are also affecting patient's functional outcome.   REHAB POTENTIAL: Good  CLINICAL DECISION MAKING: Stable/uncomplicated  EVALUATION COMPLEXITY: Moderate   GOALS: Goals reviewed with patient? Yes  SHORT TERM GOALS: No STG established due to POC <4 weeks   LONG TERM GOALS: Target date: 09/01/2024     Patient will be 100% compliant with HEP to self manage symptoms at home  Baseline: initiated 04/14/24 Goal status: progressing  2.  Patient will demo at least 10% improvement on UEFS to improve overall function. Baseline: TBD; 27/80 (05/17/24); 47/80 Goal status: Met 07/07/24  3.  Patient will demo grip strength in L UE to be >50 lbs to improve ability to hold and carry objects in L hand Baseline: 29 lbs (04/14/24); 28 lbs (05/17/24) Goal status: progressing  4.  Patient will demo L shoulder AROM to be above 160 deg to improve ability to reach in overhead cabinets. Baseline: 120 deg (eval); 135 deg (05/17/24) Goal status: progressing  5.  Pt will report overall max pain of <5/10 in L shoulder and L arm to improve function with L arm Baseline: 10/10 (04/14/24); 6-8/10 at worst (05/17/24) Goal status: progressing  6.  Patient will demo at least UEFS score of >60/80 to improve overall function. Baseline: TBD; 27/80 (05/17/24); 47/80  (07/04/24) Goal status: Revised 07/07/24   PLAN:  PT FREQUENCY: 3x/week for next 6 sessions and then 2x/week for 8 additional sessions.  PT DURATION: 10 weeks  PLANNED INTERVENTIONS: [id number]- PT Re-evaluation, 97750- Physical Performance Testing, 97110-Therapeutic exercises, 97530- Therapeutic activity, 97112- Neuromuscular re-education, 97535- Self Care, [id number]- Manual therapy, [id number]- Electrical stimulation (unattended), Patient/Family education, Joint mobilization, Joint manipulation, Spinal manipulation, Spinal mobilization, DME instructions, Cryotherapy, and Moist heat  PLAN FOR NEXT SESSION: Aprogress HEP   Raj LOISE Blanch, PT 07/31/2024, 2:06 PM

## 2024-08-02 ENCOUNTER — Ambulatory Visit

## 2024-08-02 DIAGNOSIS — M6281 Muscle weakness (generalized): Secondary | ICD-10-CM | POA: Diagnosis not present

## 2024-08-02 DIAGNOSIS — M542 Cervicalgia: Secondary | ICD-10-CM

## 2024-08-02 DIAGNOSIS — M79602 Pain in left arm: Secondary | ICD-10-CM

## 2024-08-02 DIAGNOSIS — M25512 Pain in left shoulder: Secondary | ICD-10-CM

## 2024-08-02 NOTE — Therapy (Signed)
 OUTPATIENT PHYSICAL THERAPY TREATMENT NOTE   Patient Name: [name] MRN: [id number] DOB:[birth year], 58 y.o., female Today's Date: 08/02/2024  END OF SESSION:  PT End of Session - 08/02/24 1222     Visit Number 18    Number of Visits 22    Date for Recertification  09/01/24    Authorization Type /  Approved 11 PT visits 04/19/2024 - 07/17/2024; Approved 6 visits 05/17/24-07/15/24 (Order#[id number]); 7 visits (9/26-11/24)    PT Start Time 1145    PT Stop Time 1230    PT Time Calculation (min) 45 min    Activity Tolerance Patient tolerated treatment well    Behavior During Therapy [facility] for tasks assessed/performed                 Past Medical History:  Diagnosis Date   Anemia    CAD (coronary artery disease)    PCI with stent to RCA in 2019   CHF (congestive heart failure) (HCC) 04/2011   EF 15-20% from echo 05/19/11   DOE (dyspnea on exertion)    DVT (deep venous thrombosis) (HCC) 06/2011   LLE   Fatigue    HTN (hypertension)    Hyperlipidemia    Menorrhagia    with iron  deficient anemia   NSTEMI (non-ST elevated myocardial infarction) (HCC) 10/26/2017   NSTEMI (non-ST elevated myocardial infarction) (HCC) 10/27/2017   Obesity    Orthopnea    Type II diabetes mellitus (HCC)    Past Surgical History:  Procedure Laterality Date   CORONARY ANGIOPLASTY WITH STENT PLACEMENT  2013;  10/27/2017   CORONARY BALLOON ANGIOPLASTY N/A 05/26/2022   Procedure: CORONARY BALLOON ANGIOPLASTY;  Surgeon: Elmira Newman PARAS, MD;  Location: [facility];  Service: Cardiovascular;  Laterality: N/A;   CORONARY STENT INTERVENTION N/A 10/27/2017   Procedure: CORONARY STENT INTERVENTION;  Surgeon: Elmira Newman PARAS, MD;  Location: [facility];  Service: Cardiovascular;  Laterality: N/A;   CORONARY STENT INTERVENTION N/A 03/10/2022   Procedure: CORONARY STENT INTERVENTION;  Surgeon: Ladona Heinz, MD;  Location: [facility];  Service: Cardiovascular;  Laterality: N/A;    CORONARY STENT INTERVENTION N/A 03/11/2022   Procedure: CORONARY STENT INTERVENTION;  Surgeon: Elmira Newman PARAS, MD;  [facility]: [facility];  Service: Cardiovascular;  Laterality: N/A;  aborted   CORONARY STENT INTERVENTION N/A 10/20/2022   Procedure: CORONARY STENT INTERVENTION;  Surgeon: Elmira Newman PARAS, MD;  Location: [facility];  Service: Cardiovascular;  Laterality: N/A;   CORONARY ULTRASOUND/IVUS N/A 05/26/2022   Procedure: Intravascular Ultrasound/IVUS;  Surgeon: Elmira Newman PARAS, MD;  Location: [facility];  Service: Cardiovascular;  Laterality: N/A;   CORONARY ULTRASOUND/IVUS N/A 10/20/2022   Procedure: Intravascular Ultrasound/IVUS;  Surgeon: Elmira Newman PARAS, MD;  [facility]: [facility];  Service: Cardiovascular;  Laterality: N/A;   LEFT HEART CATH AND CORONARY ANGIOGRAPHY N/A 10/27/2017   Procedure: LEFT HEART CATH AND CORONARY ANGIOGRAPHY;  Surgeon: Elmira Newman PARAS, MD;  [facility]: [facility];  Service: Cardiovascular;  Laterality: N/A;   LEFT HEART CATH AND CORONARY ANGIOGRAPHY N/A 11/17/2019   Procedure: LEFT HEART CATH AND CORONARY ANGIOGRAPHY;  Surgeon: Elmira Newman PARAS, MD;  Location: [facility];  Service: Cardiovascular;  Laterality: N/A;   LEFT HEART CATH AND CORONARY ANGIOGRAPHY N/A 03/10/2022   Procedure: LEFT HEART CATH AND CORONARY ANGIOGRAPHY;  Surgeon: Ladona Heinz, MD;  [facility]: [facility];  Service: Cardiovascular;  Laterality: N/A;   LEFT HEART  CATH AND CORONARY ANGIOGRAPHY N/A 07/27/2022   Procedure: LEFT HEART CATH AND CORONARY ANGIOGRAPHY;  Surgeon: Ladona Heinz, MD;  [facility]: [facility];  Service: Cardiovascular;  Laterality: N/A;   LEFT HEART CATH AND CORONARY ANGIOGRAPHY N/A 10/20/2022   Procedure: LEFT HEART CATH AND CORONARY ANGIOGRAPHY;  Surgeon: Elmira Newman PARAS, MD;  [facility]: [facility];  Service: Cardiovascular;  Laterality: N/A;   LEFT HEART CATHETERIZATION WITH CORONARY  ANGIOGRAM N/A 11/17/2011   Procedure: LEFT HEART CATHETERIZATION WITH CORONARY ANGIOGRAM;  Surgeon: Erick JONELLE Ladona, MD;  Location: [facility] CATH LAB;  Service: Cardiovascular;  Laterality: N/A;   OVARIAN CYST REMOVAL     TUBAL LIGATION  2006   ULTRASOUND GUIDANCE FOR VASCULAR ACCESS  10/27/2017   Procedure: Ultrasound Guidance For Vascular Access;  Surgeon: Elmira Newman PARAS, MD;  Location: [facility];  Service: Cardiovascular;;   Patient Active Problem List   Diagnosis Date Noted   Coronary artery disease involving native coronary artery of native heart without angina pectoris    Pleuritic pain 07/25/2022   History of non-ST elevation myocardial infarction (NSTEMI)    History of coronary angioplasty with insertion of stent    Mixed hyperlipidemia    Post PTCA 05/26/2022   Chronic iron  deficiency anemia 03/10/2022   Chronic diastolic CHF (congestive heart failure) (HCC) 03/10/2022   Non-insulin  dependent type 2 diabetes mellitus (HCC) 03/10/2022   Morbidly obese (HCC) 08/19/2020   Uncontrolled type 2 diabetes mellitus with hyperglycemia (HCC) 08/19/2020   Unstable angina (HCC) 11/16/2019   Non-ST elevation (NSTEMI) myocardial infarction (HCC) 10/27/2017   OSA (obstructive sleep apnea) 02/21/2013   Atherosclerosis of native coronary artery of native heart with stable angina pectoris 11/18/2011   Dyslipidemia 11/18/2011   Precordial pain 11/17/2011   Abnormal cardiovascular stress test 11/17/2011   DVT (deep venous thrombosis) (HCC) 11/01/2011   Essential hypertension 11/01/2011   Blood loss anemia 11/01/2011   Exertional dyspnea 08/19/2011    PCP: Schuyler Lesser, PA-C  REFERRING PROVIDER: Tobie Tonita POUR, DO  REFERRING DIAG: R20.0 (ICD-10-CM) - Left arm numbness  THERAPY DIAG:  Muscle weakness (generalized)  Acute pain of left shoulder  Pain in left arm  Neck pain  Rationale for Evaluation and Treatment: Rehabilitation  ONSET DATE: 04/10/2024- date of  referral  SUBJECTIVE:                                                                                                                                                                                                         SUBJECTIVE STATEMENT: Pt reports  that she has imlemented strategies that she learened last session and it has helped her with managing pain. Currently pain is 2/10 Hand dominance: Right  PERTINENT HISTORY:  CHF, CAD, DM  PAIN:  Are you having pain? Yes: NPRS scale: 2/10 Pain location: L side of neck, L arm Pain description: radiating, heavy, tingling Aggravating factors: unsure Relieving factors: unsure  PRECAUTIONS: None  RED FLAGS: None     WEIGHT BEARING RESTRICTIONS: No  FALLS:  Has patient fallen in last 6 months? No  PLOF: Independent  PATIENT GOALS: improve pain    OBJECTIVE:  Note: Objective measures were completed at Evaluation unless otherwise noted.  DIAGNOSTIC FINDINGS:  MRI of Brain: 02/17/24   IMPRESSION: 1. No acute intracranial abnormality. 2. Moderate cerebral white matter disease, nonspecific, but most likely related chronic microvascular ischemic disease. Probable tiny remote left cerebellar infarct.  PATIENT SURVEYS:   COGNITION: Overall cognitive status: Within functional limits for tasks assessed  PALPATION: GH capsule hypomobility grossly   CERVICAL ROM:   Active ROM A/PROM (deg) eval AROM 06/12/24  Flexion 75   Extension 70   Right lateral flexion    Left lateral flexion    Right rotation 75 90  Left rotation 80 60   (Blank rows = not tested)  UPPER EXTREMITY ROM:  Active/Passive ROM Right eval Left eval Left 05/17/24 Left 06/20/24 Left 07/07/24 (after injection) Left 07/24/24 active  Shoulder flexion 180/180 120/120 135/140 85 pain (AROM) 145/170 160   Shoulder abduction 180/180 90/100 130/130  145/170 160  Shoulder extension        Shoulder adduction        Shoulder extension        Shoulder  internal rotation T6 T12    T12  Shoulder external rotation T2 L occiput      Elbow flexion        Elbow extension        Wrist flexion        Wrist extension        Wrist ulnar deviation        Wrist radial deviation        Wrist pronation        Wrist supination         (Blank rows = not tested)  UPPER EXTREMITY MMT:  MMT Right eval Left eval Left  Left 05/17/24 Left 06/20/24 Right 06/20/24 Left 07/24/24  Shoulder flexion 5/5 3+/5     4  Shoulder extension         Shoulder abduction 5/5 3+/5     4  Shoulder adduction         Shoulder extension         Shoulder internal rotation 5/5 3+/5     4  Shoulder external rotation 5/5 3+/5     4  Middle trapezius         Lower trapezius         Elbow flexion 5/5 3+/5     5  Elbow extension 5/5 3+/5     4  Wrist flexion         Wrist extension         Wrist ulnar deviation         Wrist radial deviation         Wrist pronation         Wrist supination         Grip strength 69 25 71 28 14.9 lb 54 lbs 62 lbs (63lbs  on R UE)   (Blank rows = not tested)   Extreme difficulty/unable (0), Quite a bit of difficulty (1), Moderate difficulty (2), Little difficulty (3), No difficulty (4) Survey date:  05/17/24 07/07/24  Any of your usual work, household or school activities 1 2  2. Your usual hobbies, recreational/sport activities 0 0   3. Lifting a bag of groceries to waist level 1 2   4. Lifting a bag of groceries above your head 0 1  5. Grooming your hair 2 3  6. Pushing up on your hands (I.e. from bathtub or chair) 3 3  7. Preparing food (I.e. peeling/cutting) 0 2  8. Driving  0 3  9. Vacuuming, sweeping, or raking 1 2  10. Dressing  4 4  11. Doing up buttons 4 4  12. Using tools/appliances 1 2  13. Opening doors 3 4  14. Cleaning  0 2  15. Tying or lacing shoes 3 4  16. Sleeping  2 2  17. Laundering clothes (I.e. washing, ironing, folding) 0 2  18. Opening a jar 0 2  19. Throwing a ball 2 2  20. Carrying a small suitcase with  your affected limb.  0 1  Score total:  27/80  47/80   Cervical   TREATMENT DATE: Next MD appt 08/03/24 08/02/24  Passive stretching of bil cervical paraspianlis, upper trap, levator scapulae and scalenes and R pectorals Assessed R shoulder flexion ROM- within 95% of contralateral side Demo patient wall slide for end range stretching of shoulder OH motion- pt able to perform 5-10 before L upper trap started bothering her. Attempted SciFit for level 1 for 5' for warm up but patient reported increased in neck pain after about 3' so it was stopped Reviewed pain management strategies with activity modifications, improving self awareness for pain, reviewed computer ergonomics again for improved knowledge retention and compliance. Performed manual stretching of all scalenes, levator scapulae, upper trap bil PATIENT EDUCATION:  Education details: see above Person educated: Patient Education method: Explanation Education comprehension: verbalized understanding  HOME EXERCISE PROGRAM:   Access Code: [email or web address] URL: [email or web address] Date: 07/11/2024 Prepared by: Raj Blanch  Exercises - Shoulder Flexion Wall Slide with Towel  - 3 x daily - 7 x weekly - 15 reps - Seated Shoulder Flexion AAROM with Pulley Behind  - 3 x daily - 7 x weekly - Standing Shoulder Row with Anchored Resistance  - 1 x daily - 7 x weekly - 2 sets - 10 reps - Shoulder extension with resistance - Neutral  - 1 x daily - 7 x weekly - 2 sets - 10 reps - Seated Cervical Sidebending Stretch  - 1-2 x daily - 7 x weekly - 3-5 reps - 20 sec hold ASSESSMENT:  CLINICAL IMPRESSION: Pt demo compliance with self management strategies and computer ergonomics. Patient still has L sided neck pain that triggers with repatitive L shoulder motion.  OBJECTIVE IMPAIRMENTS: decreased ROM, decreased strength, hypomobility, increased fascial restrictions, increased muscle spasms, impaired flexibility, impaired UE  functional use, postural dysfunction, and pain.   ACTIVITY LIMITATIONS: carrying and sleeping  PARTICIPATION LIMITATIONS: meal prep, cleaning, laundry, driving, shopping, and community activity  PERSONAL FACTORS: Age and Time since onset of injury/illness/exacerbation are also affecting patient's functional outcome.   REHAB POTENTIAL: Good  CLINICAL DECISION MAKING: Stable/uncomplicated  EVALUATION COMPLEXITY: Moderate   GOALS: Goals reviewed with patient? Yes  SHORT TERM GOALS: No STG established due to POC <4 weeks   LONG TERM GOALS: Target date:  09/01/2024     Patient will be 100% compliant with HEP to self manage symptoms at home  Baseline: initiated 04/14/24 Goal status: progressing  2.  Patient will demo at least 10% improvement on UEFS to improve overall function. Baseline: TBD; 27/80 (05/17/24); 47/80 Goal status: Met 07/07/24  3.  Patient will demo grip strength in L UE to be >50 lbs to improve ability to hold and carry objects in L hand Baseline: 29 lbs (04/14/24); 28 lbs (05/17/24) Goal status: progressing  4.  Patient will demo L shoulder AROM to be above 160 deg to improve ability to reach in overhead cabinets. Baseline: 120 deg (eval); 135 deg (05/17/24) Goal status: progressing  5.  Pt will report overall max pain of <5/10 in L shoulder and L arm to improve function with L arm Baseline: 10/10 (04/14/24); 6-8/10 at worst (05/17/24) Goal status: progressing  6.  Patient will demo at least UEFS score of >60/80 to improve overall function. Baseline: TBD; 27/80 (05/17/24); 47/80 (07/04/24) Goal status: Revised 07/07/24   PLAN:  PT FREQUENCY: 3x/week for next 6 sessions and then 2x/week for 8 additional sessions.  PT DURATION: 10 weeks  PLANNED INTERVENTIONS: [id number]- PT Re-evaluation, 97750- Physical Performance Testing, 97110-Therapeutic exercises, 97530- Therapeutic activity, [id number]- Neuromuscular re-education, 97535- Self Care, [id number]- Manual therapy, [id number]-  Electrical stimulation (unattended), Patient/Family education, Joint mobilization, Joint manipulation, Spinal manipulation, Spinal mobilization, DME instructions, Cryotherapy, and Moist heat  PLAN FOR NEXT SESSION: Aprogress HEP   Raj LOISE Blanch, PT 08/02/2024, 12:34 PM

## 2024-08-03 ENCOUNTER — Ambulatory Visit: Admitting: Physical Medicine and Rehabilitation

## 2024-08-03 ENCOUNTER — Other Ambulatory Visit: Payer: Self-pay

## 2024-08-03 VITALS — BP 126/81 | HR 76

## 2024-08-03 DIAGNOSIS — M5412 Radiculopathy, cervical region: Secondary | ICD-10-CM | POA: Diagnosis not present

## 2024-08-03 MED ORDER — METHYLPREDNISOLONE ACETATE 40 MG/ML IJ SUSP
40.0000 mg | Freq: Once | INTRAMUSCULAR | Status: AC
  Administered 2024-08-03: 40 mg

## 2024-08-03 NOTE — Progress Notes (Signed)
 Pain Scale   Average Pain 5 Patient advising she has chronic neck pain radiating to both shoulders. Patient advising her pain is constant.        +Driver, -BT, -Dye Allergies.

## 2024-08-07 NOTE — Progress Notes (Signed)
 [name] - 58 y.o. female MRN [id number]  Date of birth: 10-Apr-1966  Office Visit Note: Visit Date: 08/03/2024 PCP: Linde Bitters, PA-C Referred by: Toepfer, Olivia, PA-C  Subjective: Chief Complaint  Patient presents with   Neck - Pain   HPI:  [name] is a 58 y.o. female who comes in today at the request of Dr. JUDITHANN Glendia Hutchinson for planned Left C7-T1 Cervical Interlaminar epidural steroid injection with fluoroscopic guidance.  The patient has failed conservative care including home exercise, medications, time and activity modification.  This injection will be diagnostic and hopefully therapeutic.  Please see requesting physician notes for further details and justification.   ROS Otherwise per HPI.  Assessment & Plan: Visit Diagnoses:    ICD-10-CM   1. Cervical radiculopathy  M54.12 XR C-ARM NO REPORT    Epidural Steroid injection    methylPREDNISolone acetate (DEPO-MEDROL) injection 40 mg      Plan: No additional findings.   Meds & Orders:  Meds ordered this encounter  Medications   methylPREDNISolone acetate (DEPO-MEDROL) injection 40 mg    Orders Placed This Encounter  Procedures   XR C-ARM NO REPORT   Epidural Steroid injection    Follow-up: Return for visit to requesting provider as needed.   Procedures: No procedures performed  Cervical Epidural Steroid Injection - Interlaminar Approach with Fluoroscopic Guidance  Patient: [name]      Date of Birth: 18-Feb-1966 MRN: [id number] PCP: Linde Bitters, PA-C      Visit Date: 08/03/2024   Universal Protocol:    Date/Time: 10/20/258:51 PM  Consent Given By: the patient  Position: PRONE  Additional Comments: Vital signs were monitored before and after the procedure. Patient was prepped and draped in the usual sterile fashion. The correct patient, procedure, and site was verified.   Injection Procedure Details:   Procedure diagnoses: Cervical radiculopathy [M54.12]    Meds  Administered:  Meds ordered this encounter  Medications   methylPREDNISolone acetate (DEPO-MEDROL) injection 40 mg     Laterality: Left  Location/Site: C7-T1  Needle: 3.5 in., 20 ga. Tuohy  Needle Placement: Paramedian epidural space  Findings:  -Comments: Excellent flow of contrast into the epidural space.  Procedure Details: Using a paramedian approach from the side mentioned above, the region overlying the inferior lamina was localized under fluoroscopic visualization and the soft tissues overlying this structure were infiltrated with 4 ml. of 1% Lidocaine  without Epinephrine. A # 20 gauge, Tuohy needle was inserted into the epidural space using a paramedian approach.  The epidural space was localized using loss of resistance along with contralateral oblique bi-planar fluoroscopic views.  After negative aspirate for air, blood, and CSF, a 2 ml. volume of Isovue -250 was injected into the epidural space and the flow of contrast was observed. Radiographs were obtained for documentation purposes.   The injectate was administered into the level noted above.  Additional Comments:  The patient tolerated the procedure well Dressing: 2 x 2 sterile gauze and Band-Aid    Post-procedure details: Patient was observed during the procedure. Post-procedure instructions were reviewed.  Patient left the clinic in stable condition.   Clinical History: CLINICAL DATA:  Initial evaluation for neck pain, left-sided radicular symptoms.   EXAM: MRI CERVICAL SPINE WITHOUT CONTRAST   TECHNIQUE: Multiplanar, multisequence MR imaging of the cervical spine was performed. No intravenous contrast was administered.   COMPARISON:  Radiograph from 06/08/2024   FINDINGS: Alignment: Examination markedly degraded by motion artifact, limiting assessment.  Straightening of the normal cervical lordosis. No significant listhesis.   Vertebrae: Vertebral body height maintained without acute or  chronic fracture. Bone marrow signal intensity overall within normal limits. No worrisome osseous lesions. Degenerative reactive endplate change present about the C5-6 and C6-7 interspaces. No other visible abnormal marrow edema.   Cord: Grossly normal signal morphology on this motion degraded exam.   Posterior Fossa, vertebral arteries, paraspinal tissues: Unremarkable.   Disc levels:   C2-C3: Mild disc bulge with uncovertebral spurring. No significant spinal stenosis. Mild left C3 foraminal narrowing. Right neural foramen remains adequately patent.   C3-C4: Degenerative vertebral disc space narrowing. Left eccentric disc osteophyte complex flattens and effaces the ventral thecal sac. Mild cord flattening without cord edema changes. Mild spinal stenosis. Severe left with moderate right C4 foraminal narrowing.   C4-C5: Left subarticular to foraminal disc protrusion (series 106, image 12). Mild left greater than right uncovertebral spurring. Flattening of the ventral thecal sac with mild cord flattening and mild spinal stenosis. Severe left C5 foraminal narrowing. Right neural foramen remains patent.   C5-C6: Left eccentric disc bulge with bilateral uncovertebral spurring. Flattening and partial effacement of the ventral thecal sac with resultant mild spinal stenosis. Severe left with moderate right C6 foraminal narrowing.   C6-C7: Mild disc bulge with uncovertebral spurring. No spinal stenosis. Severe left C7 foraminal narrowing. Right neural foramen remains patent.   C7-T1: Minimal disc bulge. Mild left-sided facet hypertrophy. No spinal stenosis. Mild left C8 foraminal narrowing. Right neural foramen remains patent.   IMPRESSION: 1. Motion degraded exam. 2. Left subarticular to foraminal disc protrusion at C4-5 with resultant mild canal and severe left C5 foraminal stenosis. 3. Left eccentric disc osteophyte complex at C3-4 with resultant mild canal, with severe left  and moderate right C4 foraminal stenosis. 4. Left eccentric disc bulge with uncovertebral spurring at C5-6 with resultant mild canal, with severe left and moderate right C6 foraminal stenosis. 5. Severe left C7 foraminal narrowing related to disc bulge and uncovertebral disease.     Electronically Signed   By: Morene Hoard M.D.   On: 07/02/2024 03:54     Objective:  VS:  HT:    WT:   BMI:     BP:126/81  HR:76bpm  TEMP: ( )  RESP:  Physical Exam Vitals and nursing note reviewed.  Constitutional:      General: She is not in acute distress.    Appearance: Normal appearance. She is not ill-appearing.  HENT:     Head: Normocephalic and atraumatic.     Right Ear: External ear normal.     Left Ear: External ear normal.  Eyes:     Extraocular Movements: Extraocular movements intact.  Cardiovascular:     Rate and Rhythm: Normal rate.     Pulses: Normal pulses.  Musculoskeletal:     Cervical back: Tenderness present. No rigidity.     Right lower leg: No edema.     Left lower leg: No edema.     Comments: Patient has good strength in the upper extremities including 5 out of 5 strength in wrist extension long finger flexion and APB.  There is no atrophy of the hands intrinsically.  There is a negative Hoffmann's test.   Lymphadenopathy:     Cervical: No cervical adenopathy.  Skin:    Findings: No erythema, lesion or rash.  Neurological:     General: No focal deficit present.     Mental Status: She is alert and oriented to person, place,  and time.     Sensory: No sensory deficit.     Motor: No weakness or abnormal muscle tone.     Coordination: Coordination normal.  Psychiatric:        Mood and Affect: Mood normal.        Behavior: Behavior normal.      Imaging: No results found.

## 2024-08-07 NOTE — Procedures (Signed)
 Cervical Epidural Steroid Injection - Interlaminar Approach with Fluoroscopic Guidance  Patient: [name]      Date of Birth: May 16, 1966 MRN: [id number] PCP: Linde Bitters, PA-C      Visit Date: 08/03/2024   Universal Protocol:    Date/Time: 10/20/258:51 PM  Consent Given By: the patient  Position: PRONE  Additional Comments: Vital signs were monitored before and after the procedure. Patient was prepped and draped in the usual sterile fashion. The correct patient, procedure, and site was verified.   Injection Procedure Details:   Procedure diagnoses: Cervical radiculopathy [M54.12]    Meds Administered:  Meds ordered this encounter  Medications   methylPREDNISolone acetate (DEPO-MEDROL) injection 40 mg     Laterality: Left  Location/Site: C7-T1  Needle: 3.5 in., 20 ga. Tuohy  Needle Placement: Paramedian epidural space  Findings:  -Comments: Excellent flow of contrast into the epidural space.  Procedure Details: Using a paramedian approach from the side mentioned above, the region overlying the inferior lamina was localized under fluoroscopic visualization and the soft tissues overlying this structure were infiltrated with 4 ml. of 1% Lidocaine  without Epinephrine. A # 20 gauge, Tuohy needle was inserted into the epidural space using a paramedian approach.  The epidural space was localized using loss of resistance along with contralateral oblique bi-planar fluoroscopic views.  After negative aspirate for air, blood, and CSF, a 2 ml. volume of Isovue -250 was injected into the epidural space and the flow of contrast was observed. Radiographs were obtained for documentation purposes.   The injectate was administered into the level noted above.  Additional Comments:  The patient tolerated the procedure well Dressing: 2 x 2 sterile gauze and Band-Aid    Post-procedure details: Patient was observed during the procedure. Post-procedure instructions were  reviewed.  Patient left the clinic in stable condition.

## 2024-08-08 ENCOUNTER — Ambulatory Visit

## 2024-08-08 DIAGNOSIS — M79602 Pain in left arm: Secondary | ICD-10-CM

## 2024-08-08 DIAGNOSIS — M542 Cervicalgia: Secondary | ICD-10-CM

## 2024-08-08 DIAGNOSIS — M25512 Pain in left shoulder: Secondary | ICD-10-CM

## 2024-08-08 DIAGNOSIS — M6281 Muscle weakness (generalized): Secondary | ICD-10-CM | POA: Diagnosis not present

## 2024-08-08 NOTE — Therapy (Unsigned)
 OUTPATIENT PHYSICAL THERAPY TREATMENT NOTE   Patient Name: PHYILLIS DASCOLI MRN: [id number] DOB:[birth year], 58 y.o., female Today's Date: 08/08/2024  END OF SESSION:  PT End of Session - 08/08/24 1116     Visit Number 19    Number of Visits 22    Date for Recertification  09/01/24    Authorization Type /  Approved 11 PT visits 04/19/2024 - 07/17/2024; Approved 6 visits 05/17/24-07/15/24 (Order#[id number]); 7 visits (9/26-11/24)    PT Start Time 1105    PT Stop Time 1200    PT Time Calculation (min) 55 min    Activity Tolerance Patient tolerated treatment well    Behavior During Therapy [facility] for tasks assessed/performed                 Past Medical History:  Diagnosis Date   Anemia    CAD (coronary artery disease)    PCI with stent to RCA in 2019   CHF (congestive heart failure) (HCC) 04/2011   EF 15-20% from echo 05/19/11   DOE (dyspnea on exertion)    DVT (deep venous thrombosis) (HCC) 06/2011   LLE   Fatigue    HTN (hypertension)    Hyperlipidemia    Menorrhagia    with iron  deficient anemia   NSTEMI (non-ST elevated myocardial infarction) (HCC) 10/26/2017   NSTEMI (non-ST elevated myocardial infarction) (HCC) 10/27/2017   Obesity    Orthopnea    Type II diabetes mellitus (HCC)    Past Surgical History:  Procedure Laterality Date   CORONARY ANGIOPLASTY WITH STENT PLACEMENT  2013;  10/27/2017   CORONARY BALLOON ANGIOPLASTY N/A 05/26/2022   Procedure: CORONARY BALLOON ANGIOPLASTY;  Surgeon: Elmira Newman PARAS, MD;  Location: [facility];  Service: Cardiovascular;  Laterality: N/A;   CORONARY STENT INTERVENTION N/A 10/27/2017   Procedure: CORONARY STENT INTERVENTION;  Surgeon: Elmira Newman PARAS, MD;  Location: [facility];  Service: Cardiovascular;  Laterality: N/A;   CORONARY STENT INTERVENTION N/A 03/10/2022   Procedure: CORONARY STENT INTERVENTION;  Surgeon: Ladona Heinz, MD;  Location: [facility];  Service: Cardiovascular;  Laterality: N/A;    CORONARY STENT INTERVENTION N/A 03/11/2022   Procedure: CORONARY STENT INTERVENTION;  Surgeon: Elmira Newman PARAS, MD;  Location: [facility];  Service: Cardiovascular;  Laterality: N/A;  aborted   CORONARY STENT INTERVENTION N/A 10/20/2022   Procedure: CORONARY STENT INTERVENTION;  Surgeon: Elmira Newman PARAS, MD;  Location: [facility];  Service: Cardiovascular;  Laterality: N/A;   CORONARY ULTRASOUND/IVUS N/A 05/26/2022   Procedure: Intravascular Ultrasound/IVUS;  Surgeon: Elmira Newman PARAS, MD;  Location: [facility];  Service: Cardiovascular;  Laterality: N/A;   CORONARY ULTRASOUND/IVUS N/A 10/20/2022   Procedure: Intravascular Ultrasound/IVUS;  Surgeon: Elmira Newman PARAS, MD;  Location: [facility];  Service: Cardiovascular;  Laterality: N/A;   LEFT HEART CATH AND CORONARY ANGIOGRAPHY N/A 10/27/2017   Procedure: LEFT HEART CATH AND CORONARY ANGIOGRAPHY;  Surgeon: Elmira Newman PARAS, MD;  Location: [facility];  Service: Cardiovascular;  Laterality: N/A;   LEFT HEART CATH AND CORONARY ANGIOGRAPHY N/A 11/17/2019   Procedure: LEFT HEART CATH AND CORONARY ANGIOGRAPHY;  Surgeon: Elmira Newman PARAS, MD;  Location: [facility];  Service: Cardiovascular;  Laterality: N/A;   LEFT HEART CATH AND CORONARY ANGIOGRAPHY N/A 03/10/2022   Procedure: LEFT HEART CATH AND CORONARY ANGIOGRAPHY;  Surgeon: Ladona Heinz, MD;  [facility]: [facility];  Service: Cardiovascular;  Laterality: N/A;   LEFT HEART  CATH AND CORONARY ANGIOGRAPHY N/A 07/27/2022   Procedure: LEFT HEART CATH AND CORONARY ANGIOGRAPHY;  Surgeon: Ladona Heinz, MD;  [facility]: [facility];  Service: Cardiovascular;  Laterality: N/A;   LEFT HEART CATH AND CORONARY ANGIOGRAPHY N/A 10/20/2022   Procedure: LEFT HEART CATH AND CORONARY ANGIOGRAPHY;  Surgeon: Elmira Newman PARAS, MD;  [facility]: [facility];  Service: Cardiovascular;  Laterality: N/A;   LEFT HEART CATHETERIZATION WITH CORONARY  ANGIOGRAM N/A 11/17/2011   Procedure: LEFT HEART CATHETERIZATION WITH CORONARY ANGIOGRAM;  Surgeon: Erick JONELLE Ladona, MD;  Location: [facility] CATH [facility];  Service: Cardiovascular;  Laterality: N/A;   OVARIAN CYST REMOVAL     TUBAL LIGATION  2006   ULTRASOUND GUIDANCE FOR VASCULAR ACCESS  10/27/2017   Procedure: Ultrasound Guidance For Vascular Access;  Surgeon: Elmira Newman PARAS, MD;  Location: [facility];  Service: Cardiovascular;;   Patient Active Problem List   Diagnosis Date Noted   Coronary artery disease involving native coronary artery of native heart without angina pectoris    Pleuritic pain 07/25/2022   History of non-ST elevation myocardial infarction (NSTEMI)    History of coronary angioplasty with insertion of stent    Mixed hyperlipidemia    Post PTCA 05/26/2022   Chronic iron  deficiency anemia 03/10/2022   Chronic diastolic CHF (congestive heart failure) (HCC) 03/10/2022   Non-insulin  dependent type 2 diabetes mellitus (HCC) 03/10/2022   Morbidly obese (HCC) 08/19/2020   Uncontrolled type 2 diabetes mellitus with hyperglycemia (HCC) 08/19/2020   Unstable angina (HCC) 11/16/2019   Non-ST elevation (NSTEMI) myocardial infarction (HCC) 10/27/2017   OSA (obstructive sleep apnea) 02/21/2013   Atherosclerosis of native coronary artery of native heart with stable angina pectoris 11/18/2011   Dyslipidemia 11/18/2011   Precordial pain 11/17/2011   Abnormal cardiovascular stress test 11/17/2011   DVT (deep venous thrombosis) (HCC) 11/01/2011   Essential hypertension 11/01/2011   Blood loss anemia 11/01/2011   Exertional dyspnea 08/19/2011    PCP: Schuyler Lesser, PA-C  REFERRING PROVIDER: Tobie Tonita POUR, DO  REFERRING DIAG: R20.0 (ICD-10-CM) - Left arm numbness  THERAPY DIAG:  Muscle weakness (generalized)  Acute pain of left shoulder  Pain in left arm  Neck pain  Rationale for Evaluation and Treatment: Rehabilitation  ONSET DATE: 04/10/2024- date of  referral  SUBJECTIVE:                                                                                                                                                                                                         SUBJECTIVE STATEMENT: Pt reports  she had the shot last week. She feels better ~60% with pain relief in her neck. She has MD appt next Thursday 08/17/24 Hand dominance: Right  PERTINENT HISTORY:  CHF, CAD, DM  PAIN:  Are you having pain? Yes: NPRS scale: 0/10 Pain location: L side of neck, L arm Pain description: radiating, heavy, tingling Aggravating factors: unsure Relieving factors: unsure  PRECAUTIONS: None  RED FLAGS: None     WEIGHT BEARING RESTRICTIONS: No  FALLS:  Has patient fallen in last 6 months? No  PLOF: Independent  PATIENT GOALS: improve pain  MD appt: 08/17/24  OBJECTIVE:  Note: Objective measures were completed at Evaluation unless otherwise noted.  DIAGNOSTIC FINDINGS:  MRI of Brain: 02/17/24   IMPRESSION: 1. No acute intracranial abnormality. 2. Moderate cerebral white matter disease, nonspecific, but most likely related chronic microvascular ischemic disease. Probable tiny remote left cerebellar infarct.  PATIENT SURVEYS:   COGNITION: Overall cognitive status: Within functional limits for tasks assessed  PALPATION: GH capsule hypomobility grossly   CERVICAL ROM:   Active ROM A/PROM (deg) eval AROM 06/12/24  Flexion 75   Extension 70   Right lateral flexion    Left lateral flexion    Right rotation 75 90  Left rotation 80 60   (Blank rows = not tested)  UPPER EXTREMITY ROM:  Active/Passive ROM Right eval Left eval Left 05/17/24 Left 06/20/24 Left 07/07/24 (after injection) Left 07/24/24 active  Shoulder flexion 180/180 120/120 135/140 85 pain (AROM) 145/170 160   Shoulder abduction 180/180 90/100 130/130  145/170 160  Shoulder extension        Shoulder adduction        Shoulder extension        Shoulder  internal rotation T6 T12    T12  Shoulder external rotation T2 L occiput      Elbow flexion        Elbow extension        Wrist flexion        Wrist extension        Wrist ulnar deviation        Wrist radial deviation        Wrist pronation        Wrist supination         (Blank rows = not tested)  UPPER EXTREMITY MMT:  MMT Right eval Left eval Left  Left 05/17/24 Left 06/20/24 Right 06/20/24 Left 07/24/24  Shoulder flexion 5/5 3+/5     4  Shoulder extension         Shoulder abduction 5/5 3+/5     4  Shoulder adduction         Shoulder extension         Shoulder internal rotation 5/5 3+/5     4  Shoulder external rotation 5/5 3+/5     4  Middle trapezius         Lower trapezius         Elbow flexion 5/5 3+/5     5  Elbow extension 5/5 3+/5     4  Wrist flexion         Wrist extension         Wrist ulnar deviation         Wrist radial deviation         Wrist pronation         Wrist supination         Grip strength 69 25 71 28 14.9 lb 54 lbs  62 lbs (63lbs on R UE)   (Blank rows = not tested)   Extreme difficulty/unable (0), Quite a bit of difficulty (1), Moderate difficulty (2), Little difficulty (3), No difficulty (4) Survey date:  05/17/24 07/07/24  Any of your usual work, household or school activities 1 2  2. Your usual hobbies, recreational/sport activities 0 0   3. Lifting a bag of groceries to waist level 1 2   4. Lifting a bag of groceries above your head 0 1  5. Grooming your hair 2 3  6. Pushing up on your hands (I.e. from bathtub or chair) 3 3  7. Preparing food (I.e. peeling/cutting) 0 2  8. Driving  0 3  9. Vacuuming, sweeping, or raking 1 2  10. Dressing  4 4  11. Doing up buttons 4 4  12. Using tools/appliances 1 2  13. Opening doors 3 4  14. Cleaning  0 2  15. Tying or lacing shoes 3 4  16. Sleeping  2 2  17. Laundering clothes (I.e. washing, ironing, folding) 0 2  18. Opening a jar 0 2  19. Throwing a ball 2 2  20. Carrying a small suitcase with  your affected limb.  0 1  Score total:  27/80  47/80   Cervical   TREATMENT DATE: Next MD appt 08/17/24  SciFit: level 1 for 10', pt reported tightness on lateral shoulder but didn't report any pain in L side of neck/upper trap region which she did within 3' in previous sessions. Pt able to complete 10 min of SciFit without significant pain. Seated scalene stretch: 3 x 30 R and L Biceps curls: 8lbs   2 x 10 bil Shoulder front and lateral lifts to 90 deg: 10x R and L, 2lbs Reviewed computer ergnomics, importance of taking a break, improving self awareness of when pain is increasing (3/10 pain is easier to manage then 8/10 pain) so being aware when pain is increasing and performing different strategies to help manage pain better.    PATIENT EDUCATION:  Education details: see above Person educated: Patient Education method: Explanation Education comprehension: verbalized understanding  HOME EXERCISE PROGRAM:   Access Code: [email or web address] URL: [email or web address] Date: 07/11/2024 Prepared by: Raj Blanch  Exercises - Shoulder Flexion Wall Slide with Towel  - 3 x daily - 7 x weekly - 15 reps - Seated Shoulder Flexion AAROM with Pulley Behind  - 3 x daily - 7 x weekly - Standing Shoulder Row with Anchored Resistance  - 1 x daily - 7 x weekly - 2 sets - 10 reps - Shoulder extension with resistance - Neutral  - 1 x daily - 7 x weekly - 2 sets - 10 reps - Seated Cervical Sidebending Stretch  - 1-2 x daily - 7 x weekly - 3-5 reps - 20 sec hold ASSESSMENT:  CLINICAL IMPRESSION: Pt is reporting improved pain and function after ESI in neck. Pt able to complete SCI fit for 10' today which she was only able to complete 3' in previous session due to increasing pain in neck.    OBJECTIVE IMPAIRMENTS: decreased ROM, decreased strength, hypomobility, increased fascial restrictions, increased muscle spasms, impaired flexibility, impaired UE functional use, postural dysfunction, and  pain.   ACTIVITY LIMITATIONS: carrying and sleeping  PARTICIPATION LIMITATIONS: meal prep, cleaning, laundry, driving, shopping, and community activity  PERSONAL FACTORS: Age and Time since onset of injury/illness/exacerbation are also affecting patient's functional outcome.   REHAB POTENTIAL: Good  CLINICAL DECISION MAKING: Stable/uncomplicated  EVALUATION COMPLEXITY:  Moderate   GOALS: Goals reviewed with patient? Yes  SHORT TERM GOALS: No STG established due to POC <4 weeks   LONG TERM GOALS: Target date: 09/01/2024     Patient will be 100% compliant with HEP to self manage symptoms at home  Baseline: initiated 04/14/24 Goal status: progressing  2.  Patient will demo at least 10% improvement on UEFS to improve overall function. Baseline: TBD; 27/80 (05/17/24); 47/80 Goal status: Met 07/07/24  3.  Patient will demo grip strength in L UE to be >50 lbs to improve ability to hold and carry objects in L hand Baseline: 29 lbs (04/14/24); 28 lbs (05/17/24) Goal status: progressing  4.  Patient will demo L shoulder AROM to be above 160 deg to improve ability to reach in overhead cabinets. Baseline: 120 deg (eval); 135 deg (05/17/24) Goal status: progressing  5.  Pt will report overall max pain of <5/10 in L shoulder and L arm to improve function with L arm Baseline: 10/10 (04/14/24); 6-8/10 at worst (05/17/24) Goal status: progressing  6.  Patient will demo at least UEFS score of >60/80 to improve overall function. Baseline: TBD; 27/80 (05/17/24); 47/80 (07/04/24) Goal status: Revised 07/07/24   PLAN:  PT FREQUENCY: 3x/week for next 6 sessions and then 2x/week for 8 additional sessions.  PT DURATION: 10 weeks  PLANNED INTERVENTIONS: [id number]- PT Re-evaluation, 97750- Physical Performance Testing, 97110-Therapeutic exercises, 97530- Therapeutic activity, 97112- Neuromuscular re-education, 97535- Self Care, [id number]- Manual therapy, [id number]- Electrical stimulation (unattended),  Patient/Family education, Joint mobilization, Joint manipulation, Spinal manipulation, Spinal mobilization, DME instructions, Cryotherapy, and Moist heat  PLAN FOR NEXT SESSION: Aprogress HEP   Raj LOISE Blanch, PT 08/08/2024, 11:16 AM

## 2024-08-10 ENCOUNTER — Ambulatory Visit

## 2024-08-14 ENCOUNTER — Ambulatory Visit

## 2024-08-14 DIAGNOSIS — M79602 Pain in left arm: Secondary | ICD-10-CM

## 2024-08-14 DIAGNOSIS — M542 Cervicalgia: Secondary | ICD-10-CM

## 2024-08-14 DIAGNOSIS — M6281 Muscle weakness (generalized): Secondary | ICD-10-CM | POA: Diagnosis not present

## 2024-08-14 DIAGNOSIS — M25512 Pain in left shoulder: Secondary | ICD-10-CM

## 2024-08-14 NOTE — Therapy (Signed)
 OUTPATIENT PHYSICAL THERAPY TREATMENT NOTE   Patient Name: [name] MRN: [id number] DOB:[birth year], 58 y.o., female Today's Date: 08/14/2024  END OF SESSION:  PT End of Session - 08/14/24 1059     Visit Number 20    Number of Visits 22    Date for Recertification  09/01/24    Authorization Type /  Approved 11 PT visits 04/19/2024 - 07/17/2024; Approved 6 visits 05/17/24-07/15/24 (Order#[id number]); 7 visits (9/26-11/24)    PT Start Time 1100    PT Stop Time 1145    PT Time Calculation (min) 45 min    Activity Tolerance Patient tolerated treatment well    Behavior During Therapy [facility] for tasks assessed/performed                 Past Medical History:  Diagnosis Date   Anemia    CAD (coronary artery disease)    PCI with stent to RCA in 2019   CHF (congestive heart failure) (HCC) 04/2011   EF 15-20% from echo 05/19/11   DOE (dyspnea on exertion)    DVT (deep venous thrombosis) (HCC) 06/2011   LLE   Fatigue    HTN (hypertension)    Hyperlipidemia    Menorrhagia    with iron  deficient anemia   NSTEMI (non-ST elevated myocardial infarction) (HCC) 10/26/2017   NSTEMI (non-ST elevated myocardial infarction) (HCC) 10/27/2017   Obesity    Orthopnea    Type II diabetes mellitus (HCC)    Past Surgical History:  Procedure Laterality Date   CORONARY ANGIOPLASTY WITH STENT PLACEMENT  2013;  10/27/2017   CORONARY BALLOON ANGIOPLASTY N/A 05/26/2022   Procedure: CORONARY BALLOON ANGIOPLASTY;  Surgeon: Elmira Newman PARAS, MD;  Location: [facility];  Service: Cardiovascular;  Laterality: N/A;   CORONARY STENT INTERVENTION N/A 10/27/2017   Procedure: CORONARY STENT INTERVENTION;  Surgeon: Elmira Newman PARAS, MD;  Location: [facility];  Service: Cardiovascular;  Laterality: N/A;   CORONARY STENT INTERVENTION N/A 03/10/2022   Procedure: CORONARY STENT INTERVENTION;  Surgeon: Ladona Heinz, MD;  Location: [facility];  Service: Cardiovascular;  Laterality: N/A;    CORONARY STENT INTERVENTION N/A 03/11/2022   Procedure: CORONARY STENT INTERVENTION;  Surgeon: Elmira Newman PARAS, MD;  Location: [facility];  Service: Cardiovascular;  Laterality: N/A;  aborted   CORONARY STENT INTERVENTION N/A 10/20/2022   Procedure: CORONARY STENT INTERVENTION;  Surgeon: Elmira Newman PARAS, MD;  Location: [facility];  Service: Cardiovascular;  Laterality: N/A;   CORONARY ULTRASOUND/IVUS N/A 05/26/2022   Procedure: Intravascular Ultrasound/IVUS;  Surgeon: Elmira Newman PARAS, MD;  Location: [facility];  Service: Cardiovascular;  Laterality: N/A;   CORONARY ULTRASOUND/IVUS N/A 10/20/2022   Procedure: Intravascular Ultrasound/IVUS;  Surgeon: Elmira Newman PARAS, MD;  Location: [facility];  Service: Cardiovascular;  Laterality: N/A;   LEFT HEART CATH AND CORONARY ANGIOGRAPHY N/A 10/27/2017   Procedure: LEFT HEART CATH AND CORONARY ANGIOGRAPHY;  Surgeon: Elmira Newman PARAS, MD;  Location: [facility];  Service: Cardiovascular;  Laterality: N/A;   LEFT HEART CATH AND CORONARY ANGIOGRAPHY N/A 11/17/2019   Procedure: LEFT HEART CATH AND CORONARY ANGIOGRAPHY;  Surgeon: Elmira Newman PARAS, MD;  Location: [facility];  Service: Cardiovascular;  Laterality: N/A;   LEFT HEART CATH AND CORONARY ANGIOGRAPHY N/A 03/10/2022   Procedure: LEFT HEART CATH AND CORONARY ANGIOGRAPHY;  Surgeon: Ladona Heinz, MD;  Location: [facility];  Service: Cardiovascular;  Laterality: N/A;   LEFT HEART  CATH AND CORONARY ANGIOGRAPHY N/A 07/27/2022   Procedure: LEFT HEART CATH AND CORONARY ANGIOGRAPHY;  Surgeon: Ladona Heinz, MD;  Location: [facility];  Service: Cardiovascular;  Laterality: N/A;   LEFT HEART CATH AND CORONARY ANGIOGRAPHY N/A 10/20/2022   Procedure: LEFT HEART CATH AND CORONARY ANGIOGRAPHY;  Surgeon: Elmira Newman PARAS, MD;  [facility]: [facility];  Service: Cardiovascular;  Laterality: N/A;   LEFT HEART CATHETERIZATION WITH CORONARY  ANGIOGRAM N/A 11/17/2011   Procedure: LEFT HEART CATHETERIZATION WITH CORONARY ANGIOGRAM;  Surgeon: Erick JONELLE Ladona, MD;  Location: [facility] CATH [facility];  Service: Cardiovascular;  Laterality: N/A;   OVARIAN CYST REMOVAL     TUBAL LIGATION  2006   ULTRASOUND GUIDANCE FOR VASCULAR ACCESS  10/27/2017   Procedure: Ultrasound Guidance For Vascular Access;  Surgeon: Elmira Newman PARAS, MD;  Location: [facility];  Service: Cardiovascular;;   Patient Active Problem List   Diagnosis Date Noted   Coronary artery disease involving native coronary artery of native heart without angina pectoris    Pleuritic pain 07/25/2022   History of non-ST elevation myocardial infarction (NSTEMI)    History of coronary angioplasty with insertion of stent    Mixed hyperlipidemia    Post PTCA 05/26/2022   Chronic iron  deficiency anemia 03/10/2022   Chronic diastolic CHF (congestive heart failure) (HCC) 03/10/2022   Non-insulin  dependent type 2 diabetes mellitus (HCC) 03/10/2022   Morbidly obese (HCC) 08/19/2020   Uncontrolled type 2 diabetes mellitus with hyperglycemia (HCC) 08/19/2020   Unstable angina (HCC) 11/16/2019   Non-ST elevation (NSTEMI) myocardial infarction (HCC) 10/27/2017   OSA (obstructive sleep apnea) 02/21/2013   Atherosclerosis of native coronary artery of native heart with stable angina pectoris 11/18/2011   Dyslipidemia 11/18/2011   Precordial pain 11/17/2011   Abnormal cardiovascular stress test 11/17/2011   DVT (deep venous thrombosis) (HCC) 11/01/2011   Essential hypertension 11/01/2011   Blood loss anemia 11/01/2011   Exertional dyspnea 08/19/2011    PCP: Schuyler Lesser, PA-C  REFERRING PROVIDER: Tobie Tonita POUR, DO  REFERRING DIAG: R20.0 (ICD-10-CM) - Left arm numbness  THERAPY DIAG:  Muscle weakness (generalized)  Acute pain of left shoulder  Pain in left arm  Neck pain  Rationale for Evaluation and Treatment: Rehabilitation  ONSET DATE: 04/10/2024- date of  referral  SUBJECTIVE:                                                                                                                                                                                                         SUBJECTIVE STATEMENT: Pt reports  she just feels pressure on top of the shoulder right now. She did have some pain on top of the shoulder over the weekend. Pt reports intermittently, her left hand shakes when she is holding something in her hand (not necessarily heavy). Hand dominance: Right  PERTINENT HISTORY:  CHF, CAD, DM  PAIN:  Are you having pain? Yes: NPRS scale: 0/10 Pain location: L side of neck, L arm Pain description: radiating, heavy, tingling Aggravating factors: unsure Relieving factors: unsure  PRECAUTIONS: None  RED FLAGS: None     WEIGHT BEARING RESTRICTIONS: No  FALLS:  Has patient fallen in last 6 months? No  PLOF: Independent  PATIENT GOALS: improve pain  MD appt: 08/17/24  OBJECTIVE:  Note: Objective measures were completed at Evaluation unless otherwise noted.  DIAGNOSTIC FINDINGS:  MRI of Brain: 02/17/24   IMPRESSION: 1. No acute intracranial abnormality. 2. Moderate cerebral white matter disease, nonspecific, but most likely related chronic microvascular ischemic disease. Probable tiny remote left cerebellar infarct.  PATIENT SURVEYS:   COGNITION: Overall cognitive status: Within functional limits for tasks assessed  PALPATION: GH capsule hypomobility grossly   CERVICAL ROM:   Active ROM A/PROM (deg) eval AROM 06/12/24  Flexion 75   Extension 70   Right lateral flexion    Left lateral flexion    Right rotation 75 90  Left rotation 80 60   (Blank rows = not tested)  UPPER EXTREMITY ROM:  Active/Passive ROM Right eval Left eval Left 05/17/24 Left 06/20/24 Left 07/07/24 (after injection) Left 07/24/24 active  Shoulder flexion 180/180 120/120 135/140 85 pain (AROM) 145/170 160   Shoulder abduction 180/180  90/100 130/130  145/170 160  Shoulder extension        Shoulder adduction        Shoulder extension        Shoulder internal rotation T6 T12    T12  Shoulder external rotation T2 L occiput      Elbow flexion        Elbow extension        Wrist flexion        Wrist extension        Wrist ulnar deviation        Wrist radial deviation        Wrist pronation        Wrist supination         (Blank rows = not tested)  UPPER EXTREMITY MMT:  MMT Right eval Left eval Left  Left 05/17/24 Left 06/20/24 Right 06/20/24 Left 07/24/24  Shoulder flexion 5/5 3+/5     4  Shoulder extension         Shoulder abduction 5/5 3+/5     4  Shoulder adduction         Shoulder extension         Shoulder internal rotation 5/5 3+/5     4  Shoulder external rotation 5/5 3+/5     4  Middle trapezius         Lower trapezius         Elbow flexion 5/5 3+/5     5  Elbow extension 5/5 3+/5     4  Wrist flexion         Wrist extension         Wrist ulnar deviation         Wrist radial deviation         Wrist pronation         Wrist  supination         Grip strength 69 25 71 28 14.9 lb 54 lbs 62 lbs (63lbs on R UE)   (Blank rows = not tested)   Extreme difficulty/unable (0), Quite a bit of difficulty (1), Moderate difficulty (2), Little difficulty (3), No difficulty (4) Survey date:  05/17/24 07/07/24  Any of your usual work, household or school activities 1 2  2. Your usual hobbies, recreational/sport activities 0 0   3. Lifting a bag of groceries to waist level 1 2   4. Lifting a bag of groceries above your head 0 1  5. Grooming your hair 2 3  6. Pushing up on your hands (I.e. from bathtub or chair) 3 3  7. Preparing food (I.e. peeling/cutting) 0 2  8. Driving  0 3  9. Vacuuming, sweeping, or raking 1 2  10. Dressing  4 4  11. Doing up buttons 4 4  12. Using tools/appliances 1 2  13. Opening doors 3 4  14. Cleaning  0 2  15. Tying or lacing shoes 3 4  16. Sleeping  2 2  17. Laundering clothes  (I.e. washing, ironing, folding) 0 2  18. Opening a jar 0 2  19. Throwing a ball 2 2  20. Carrying a small suitcase with your affected limb.  0 1  Score total:  27/80  47/80   Cervical   TREATMENT DATE: Next MD appt 08/17/24  SciFit: level 5 for 10', Pt reported constant pressure sensation L shoulder which didn't get worst with time. Pt demonstrated allodynia with light tapping (non noxious) over L upper trap, L middle trap and L cervical paraspinalis   Desensitization: using wash cloth over hypersensitive area of L upper trap, left cervical paraspinalis and L periscapular region 10' E-stim: IFC setting 45m A for 20' to upper thoracic region, including bil upper traps, with hotpack over L shoulder Pt educated on chronic pain and one of the ways to help decrease sensitivity is by placing conscious input of relaxing shoulder/neck muscles when there is higher level of pain despite not performing any painful activities.   PATIENT EDUCATION:  Education details: see above Person educated: Patient Education method: Explanation Education comprehension: verbalized understanding  HOME EXERCISE PROGRAM:   Access Code: [email or web address] URL: [email or web address] Date: 07/11/2024 Prepared by: Raj Blanch  Exercises - Shoulder Flexion Wall Slide with Towel  - 3 x daily - 7 x weekly - 15 reps - Seated Shoulder Flexion AAROM with Pulley Behind  - 3 x daily - 7 x weekly - Standing Shoulder Row with Anchored Resistance  - 1 x daily - 7 x weekly - 2 sets - 10 reps - Shoulder extension with resistance - Neutral  - 1 x daily - 7 x weekly - 2 sets - 10 reps - Seated Cervical Sidebending Stretch  - 1-2 x daily - 7 x weekly - 3-5 reps - 20 sec hold ASSESSMENT:  CLINICAL IMPRESSION: Pt continues to demonstrate hypersensitivity over L cervical/thoracic region. Pt reported decreased pain and sensitivity to light tough with desensitization technique and E-stim at end of the  session.  OBJECTIVE IMPAIRMENTS: decreased ROM, decreased strength, hypomobility, increased fascial restrictions, increased muscle spasms, impaired flexibility, impaired UE functional use, postural dysfunction, and pain.   ACTIVITY LIMITATIONS: carrying and sleeping  PARTICIPATION LIMITATIONS: meal prep, cleaning, laundry, driving, shopping, and community activity  PERSONAL FACTORS: Age and Time since onset of injury/illness/exacerbation are also affecting patient's functional outcome.   REHAB POTENTIAL: Good  CLINICAL DECISION MAKING: Stable/uncomplicated  EVALUATION COMPLEXITY: Moderate   GOALS: Goals reviewed with patient? Yes  SHORT TERM GOALS: No STG established due to POC <4 weeks   LONG TERM GOALS: Target date: 09/01/2024     Patient will be 100% compliant with HEP to self manage symptoms at home  Baseline: initiated 04/14/24 Goal status: progressing  2.  Patient will demo at least 10% improvement on UEFS to improve overall function. Baseline: TBD; 27/80 (05/17/24); 47/80 Goal status: Met 07/07/24  3.  Patient will demo grip strength in L UE to be >50 lbs to improve ability to hold and carry objects in L hand Baseline: 29 lbs (04/14/24); 28 lbs (05/17/24) Goal status: progressing  4.  Patient will demo L shoulder AROM to be above 160 deg to improve ability to reach in overhead cabinets. Baseline: 120 deg (eval); 135 deg (05/17/24) Goal status: progressing  5.  Pt will report overall max pain of <5/10 in L shoulder and L arm to improve function with L arm Baseline: 10/10 (04/14/24); 6-8/10 at worst (05/17/24) Goal status: progressing  6.  Patient will demo at least UEFS score of >60/80 to improve overall function. Baseline: TBD; 27/80 (05/17/24); 47/80 (07/04/24) Goal status: Revised 07/07/24   PLAN:  PT FREQUENCY: 3x/week for next 6 sessions and then 2x/week for 8 additional sessions.  PT DURATION: 10 weeks  PLANNED INTERVENTIONS: [id number]- PT Re-evaluation, 97750-  Physical Performance Testing, 97110-Therapeutic exercises, 97530- Therapeutic activity, 97112- Neuromuscular re-education, 97535- Self Care, [id number]- Manual therapy, [id number]- Electrical stimulation (unattended), Patient/Family education, Joint mobilization, Joint manipulation, Spinal manipulation, Spinal mobilization, DME instructions, Cryotherapy, and Moist heat  PLAN FOR NEXT SESSION: Aprogress HEP   Raj LOISE Blanch, PT 08/14/2024, 10:59 AM

## 2024-08-17 ENCOUNTER — Other Ambulatory Visit: Payer: Self-pay

## 2024-08-17 ENCOUNTER — Encounter: Payer: Self-pay | Admitting: Orthopedic Surgery

## 2024-08-17 ENCOUNTER — Ambulatory Visit: Admitting: Orthopedic Surgery

## 2024-08-17 ENCOUNTER — Ambulatory Visit

## 2024-08-17 DIAGNOSIS — M25512 Pain in left shoulder: Secondary | ICD-10-CM | POA: Diagnosis not present

## 2024-08-17 DIAGNOSIS — M6281 Muscle weakness (generalized): Secondary | ICD-10-CM | POA: Diagnosis not present

## 2024-08-17 DIAGNOSIS — M79602 Pain in left arm: Secondary | ICD-10-CM

## 2024-08-17 DIAGNOSIS — G8929 Other chronic pain: Secondary | ICD-10-CM | POA: Diagnosis not present

## 2024-08-17 DIAGNOSIS — M19012 Primary osteoarthritis, left shoulder: Secondary | ICD-10-CM | POA: Diagnosis not present

## 2024-08-17 DIAGNOSIS — M542 Cervicalgia: Secondary | ICD-10-CM

## 2024-08-17 NOTE — Progress Notes (Signed)
 Office Visit Note   Patient: [name]           Date of Birth: 1966/05/23           MRN: [id number] Visit Date: 08/17/2024 Requested by: Linde Bitters, PA-C [address] [place],  [place] [address] PCP: Linde Bitters, PA-C  Subjective: Chief Complaint  Patient presents with   Left Shoulder - Pain    HPI: [name] is a 58 y.o. female who presents to the office reporting neck and left shoulder pain.  She is doing about the same.  Did have some relief from shoulder injection glenohumeral joint performed in September.  She also has had cervical spine ESI done in mid October.  That pain has lightened up some as well.  She does have severe left-sided C5 and C4 foraminal narrowing.  Her shoulder seems to be more symptomatic than the neck based on symptom relief from differential injections.  She reports continued left radicular symptoms with left hand tingling including both dorsal and palmar surfaces.  She can sleep occasionally on the left-hand side.  Currently not working.  She states she really cannot live with this pain the way it is.  She definitely has 2 pain generators on that left-hand side..                ROS: All systems reviewed are negative as they relate to the chief complaint within the history of present illness.  Patient denies fevers or chills.  Assessment & Plan: Visit Diagnoses:  1. Chronic left shoulder pain     Plan: Impression is left shoulder and neck pain.  She has some foraminal stenosis on the left and she also has shoulder pathology on MRI scan.  She is having a lot of AC joint symptoms today which she did not have in the past.  Ultrasound-guided AC joint injection performed today and we will see how she does with that injection.  6-week return with decision for or against shoulder surgery versus neck surgery based on how she feels like she did with these injections.  The shoulder does not look like she has adhesive capsulitis at this time.   Follow-up in 6 weeks with discussion about intervention at that time.  Follow-Up Instructions: No follow-ups on file.   Orders:  Orders Placed This Encounter  Procedures   US  Guided Needle Placement - No Linked Charges   No orders of the defined types were placed in this encounter.     Procedures: Medium Joint Inj: L acromioclavicular on 08/17/2024 6:09 PM Indications: diagnostic evaluation and pain Details: 25 G 1.5 in needle, ultrasound-guided superior approach Medications: 3 mL lidocaine  1 %; 0.66 mL bupivacaine  0.25 %; 20 mg triamcinolone  acetonide 40 MG/ML Outcome: tolerated well, no immediate complications Procedure, treatment alternatives, risks and benefits explained, specific risks discussed. Consent was given by the patient. Immediately prior to procedure a time out was called to verify the correct patient, procedure, equipment, support staff and site/side marked as required. Patient was prepped and draped in the usual sterile fashion.       Clinical Data: No additional findings.  Objective: Vital Signs: LMP 06/16/2019   Physical Exam:  Constitutional: Patient appears well-developed HEENT:  Head: Normocephalic Eyes:EOM are normal Neck: Normal range of motion Cardiovascular: Normal rate Pulmonary/chest: Effort normal Neurologic: Patient is alert Skin: Skin is warm Psychiatric: Patient has normal mood and affect  Ortho Exam: Ortho exam demonstrates bilateral shoulder range of motion of 65/105/170.  Rotator cuff strength intact bilaterally to infraspinatus supraspinatus and subscap muscle testing.  Does have AC joint tenderness on the left today compared to the right with some pain with crossarm adduction.  O'Brien's testing is equivocal on the left negative on the right.  Not too much in terms of coarseness with internal and external rotation at 90 degrees of abduction on that left-hand side.  Does have some paresthesias in that C5-C6-C7 distribution on the left not  the right.  Negative Tinel's cubital tunnel on that left-hand side.  Wrist and elbow range of motion of the left intact.  Specialty Comments:  CLINICAL DATA:  Initial evaluation for neck pain, left-sided radicular symptoms.   EXAM: MRI CERVICAL SPINE WITHOUT CONTRAST   TECHNIQUE: Multiplanar, multisequence MR imaging of the cervical spine was performed. No intravenous contrast was administered.   COMPARISON:  Radiograph from 06/08/2024   FINDINGS: Alignment: Examination markedly degraded by motion artifact, limiting assessment.   Straightening of the normal cervical lordosis. No significant listhesis.   Vertebrae: Vertebral body height maintained without acute or chronic fracture. Bone marrow signal intensity overall within normal limits. No worrisome osseous lesions. Degenerative reactive endplate change present about the C5-6 and C6-7 interspaces. No other visible abnormal marrow edema.   Cord: Grossly normal signal morphology on this motion degraded exam.   Posterior Fossa, vertebral arteries, paraspinal tissues: Unremarkable.   Disc levels:   C2-C3: Mild disc bulge with uncovertebral spurring. No significant spinal stenosis. Mild left C3 foraminal narrowing. Right neural foramen remains adequately patent.   C3-C4: Degenerative vertebral disc space narrowing. Left eccentric disc osteophyte complex flattens and effaces the ventral thecal sac. Mild cord flattening without cord edema changes. Mild spinal stenosis. Severe left with moderate right C4 foraminal narrowing.   C4-C5: Left subarticular to foraminal disc protrusion (series 106, image 12). Mild left greater than right uncovertebral spurring. Flattening of the ventral thecal sac with mild cord flattening and mild spinal stenosis. Severe left C5 foraminal narrowing. Right neural foramen remains patent.   C5-C6: Left eccentric disc bulge with bilateral uncovertebral spurring. Flattening and partial effacement  of the ventral thecal sac with resultant mild spinal stenosis. Severe left with moderate right C6 foraminal narrowing.   C6-C7: Mild disc bulge with uncovertebral spurring. No spinal stenosis. Severe left C7 foraminal narrowing. Right neural foramen remains patent.   C7-T1: Minimal disc bulge. Mild left-sided facet hypertrophy. No spinal stenosis. Mild left C8 foraminal narrowing. Right neural foramen remains patent.   IMPRESSION: 1. Motion degraded exam. 2. Left subarticular to foraminal disc protrusion at C4-5 with resultant mild canal and severe left C5 foraminal stenosis. 3. Left eccentric disc osteophyte complex at C3-4 with resultant mild canal, with severe left and moderate right C4 foraminal stenosis. 4. Left eccentric disc bulge with uncovertebral spurring at C5-6 with resultant mild canal, with severe left and moderate right C6 foraminal stenosis. 5. Severe left C7 foraminal narrowing related to disc bulge and uncovertebral disease.     Electronically Signed   By: Morene Hoard M.D.   On: 07/02/2024 03:54  Imaging: US  Guided Needle Placement - No Linked Charges Result Date: 08/17/2024 Ultrasound imaging demonstrates needle placement into the [facility] joint with injection of fluid into the joint and no complicating features Left    PMFS History: Patient Active Problem List   Diagnosis Date Noted   Coronary artery disease involving native coronary artery of native heart without angina pectoris    Pleuritic pain 07/25/2022   History of non-ST elevation  myocardial infarction (NSTEMI)    History of coronary angioplasty with insertion of stent    Mixed hyperlipidemia    Post PTCA 05/26/2022   Chronic iron  deficiency anemia 03/10/2022   Chronic diastolic CHF (congestive heart failure) (HCC) 03/10/2022   Non-insulin  dependent type 2 diabetes mellitus (HCC) 03/10/2022   Morbidly obese (HCC) 08/19/2020   Uncontrolled type 2 diabetes mellitus with hyperglycemia (HCC)  08/19/2020   Unstable angina (HCC) 11/16/2019   Non-ST elevation (NSTEMI) myocardial infarction (HCC) 10/27/2017   OSA (obstructive sleep apnea) 02/21/2013   Atherosclerosis of native coronary artery of native heart with stable angina pectoris 11/18/2011   Dyslipidemia 11/18/2011   Precordial pain 11/17/2011   Abnormal cardiovascular stress test 11/17/2011   DVT (deep venous thrombosis) (HCC) 11/01/2011   Essential hypertension 11/01/2011   Blood loss anemia 11/01/2011   Exertional dyspnea 08/19/2011   Past Medical History:  Diagnosis Date   Anemia    CAD (coronary artery disease)    PCI with stent to RCA in 2019   CHF (congestive heart failure) (HCC) 04/2011   EF 15-20% from echo 05/19/11   DOE (dyspnea on exertion)    DVT (deep venous thrombosis) (HCC) 06/2011   LLE   Fatigue    HTN (hypertension)    Hyperlipidemia    Menorrhagia    with iron  deficient anemia   NSTEMI (non-ST elevated myocardial infarction) (HCC) 10/26/2017   NSTEMI (non-ST elevated myocardial infarction) (HCC) 10/27/2017   Obesity    Orthopnea    Type II diabetes mellitus (HCC)     Family History  Problem Relation Age of Onset   Hypertension Mother    Stroke Father    Heart attack Sister    Stroke Sister     Past Surgical History:  Procedure Laterality Date   CORONARY ANGIOPLASTY WITH STENT PLACEMENT  2013;  10/27/2017   CORONARY BALLOON ANGIOPLASTY N/A 05/26/2022   Procedure: CORONARY BALLOON ANGIOPLASTY;  Surgeon: Elmira Newman PARAS, MD;  Location: [facility];  Service: Cardiovascular;  Laterality: N/A;   CORONARY STENT INTERVENTION N/A 10/27/2017   Procedure: CORONARY STENT INTERVENTION;  Surgeon: Elmira Newman PARAS, MD;  Location: [facility];  Service: Cardiovascular;  Laterality: N/A;   CORONARY STENT INTERVENTION N/A 03/10/2022   Procedure: CORONARY STENT INTERVENTION;  Surgeon: Ladona Heinz, MD;  Location: [facility];  Service: Cardiovascular;  Laterality: N/A;    CORONARY STENT INTERVENTION N/A 03/11/2022   Procedure: CORONARY STENT INTERVENTION;  Surgeon: Elmira Newman PARAS, MD;  Location: [facility];  Service: Cardiovascular;  Laterality: N/A;  aborted   CORONARY STENT INTERVENTION N/A 10/20/2022   Procedure: CORONARY STENT INTERVENTION;  Surgeon: Elmira Newman PARAS, MD;  Location: [facility];  Service: Cardiovascular;  Laterality: N/A;   CORONARY ULTRASOUND/IVUS N/A 05/26/2022   Procedure: Intravascular Ultrasound/IVUS;  Surgeon: Elmira Newman PARAS, MD;  Location: [facility];  Service: Cardiovascular;  Laterality: N/A;   CORONARY ULTRASOUND/IVUS N/A 10/20/2022   Procedure: Intravascular Ultrasound/IVUS;  Surgeon: Elmira Newman PARAS, MD;  Location: [facility];  Service: Cardiovascular;  Laterality: N/A;   LEFT HEART CATH AND CORONARY ANGIOGRAPHY N/A 10/27/2017   Procedure: LEFT HEART CATH AND CORONARY ANGIOGRAPHY;  Surgeon: Elmira Newman PARAS, MD;  Location: [facility];  Service: Cardiovascular;  Laterality: N/A;   LEFT HEART CATH AND CORONARY ANGIOGRAPHY N/A 11/17/2019   Procedure: LEFT HEART CATH AND CORONARY ANGIOGRAPHY;  Surgeon: Elmira Newman PARAS, MD;  Location: [facility];  Service: Cardiovascular;  Laterality: N/A;   LEFT HEART CATH AND CORONARY ANGIOGRAPHY N/A 03/10/2022   Procedure: LEFT HEART CATH AND CORONARY ANGIOGRAPHY;  Surgeon: Ladona Heinz, MD;  Location: [facility];  Service: Cardiovascular;  Laterality: N/A;   LEFT HEART CATH AND CORONARY ANGIOGRAPHY N/A 07/27/2022   Procedure: LEFT HEART CATH AND CORONARY ANGIOGRAPHY;  Surgeon: Ladona Heinz, MD;  Location: [facility];  Service: Cardiovascular;  Laterality: N/A;   LEFT HEART CATH AND CORONARY ANGIOGRAPHY N/A 10/20/2022   Procedure: LEFT HEART CATH AND CORONARY ANGIOGRAPHY;  Surgeon: Elmira Newman PARAS, MD;  Location: [facility];  Service: Cardiovascular;  Laterality: N/A;   LEFT HEART CATHETERIZATION WITH CORONARY  ANGIOGRAM N/A 11/17/2011   Procedure: LEFT HEART CATHETERIZATION WITH CORONARY ANGIOGRAM;  Surgeon: Erick JONELLE Ladona, MD;  Location: [facility] C[facility];  Service: Cardiovascular;  Laterality: N/A;   OVARIAN CYST REMOVAL     TUBAL LIGATION  2006   ULTRASOUND GUIDANCE FOR VASCULAR ACCESS  10/27/2017   Procedure: Ultrasound Guidance For Vascular Access;  Surgeon: Elmira Newman PARAS, MD;  Location: [facility];  Service: Cardiovascular;;   Social History   Occupational History   Occupation: unemployed  Tobacco Use   Smoking status: Former    Current packs/day: 0.00    Average packs/day: 0.5 packs/day for 10.0 years (5.0 ttl pk-yrs)    Types: Cigarettes    Start date: 10/20/1983    Quit date: 10/19/1993    Years since quitting: 30.8   Smokeless tobacco: Never  Vaping Use   Vaping status: Never Used  Substance and Sexual Activity   Alcohol use: No   Drug use: No   Sexual activity: Not Currently

## 2024-08-17 NOTE — Therapy (Signed)
 OUTPATIENT PHYSICAL THERAPY TREATMENT NOTE   Patient Name: CORNIE MCCOMBER MRN: [id number] DOB:07-27-1966, 58 y.o., female Today's Date: 08/17/2024  END OF SESSION:  PT End of Session - 08/17/24 1102     Visit Number 21    Number of Visits 22    Date for Recertification  09/01/24    Authorization Type /  Approved 11 PT visits 04/19/2024 - 07/17/2024; Approved 6 visits 05/17/24-07/15/24 (Order#[id number]); 7 visits (9/26-11/24)    PT Start Time 1105    PT Stop Time 1145    PT Time Calculation (min) 40 min    Activity Tolerance Patient tolerated treatment well    Behavior During Therapy [facility] for tasks assessed/performed                 Past Medical History:  Diagnosis Date   Anemia    CAD (coronary artery disease)    PCI with stent to RCA in 2019   CHF (congestive heart failure) (HCC) 04/2011   EF 15-20% from echo 05/19/11   DOE (dyspnea on exertion)    DVT (deep venous thrombosis) (HCC) 06/2011   LLE   Fatigue    HTN (hypertension)    Hyperlipidemia    Menorrhagia    with iron  deficient anemia   NSTEMI (non-ST elevated myocardial infarction) (HCC) 10/26/2017   NSTEMI (non-ST elevated myocardial infarction) (HCC) 10/27/2017   Obesity    Orthopnea    Type II diabetes mellitus (HCC)    Past Surgical History:  Procedure Laterality Date   CORONARY ANGIOPLASTY WITH STENT PLACEMENT  2013;  10/27/2017   CORONARY BALLOON ANGIOPLASTY N/A 05/26/2022   Procedure: CORONARY BALLOON ANGIOPLASTY;  Surgeon: Elmira Newman PARAS, MD;  [facility]: [facility];  Service: Cardiovascular;  Laterality: N/A;   CORONARY STENT INTERVENTION N/A 10/27/2017   Procedure: CORONARY STENT INTERVENTION;  Surgeon: Elmira Newman PARAS, MD;  Location: [facility];  Service: Cardiovascular;  Laterality: N/A;   CORONARY STENT INTERVENTION N/A 03/10/2022   Procedure: CORONARY STENT INTERVENTION;  Surgeon: Ladona Heinz, MD;  Location: [facility];  Service: Cardiovascular;  Laterality: N/A;    CORONARY STENT INTERVENTION N/A 03/11/2022   Procedure: CORONARY STENT INTERVENTION;  Surgeon: Elmira Newman PARAS, MD;  Location: [facility];  Service: Cardiovascular;  Laterality: N/A;  aborted   CORONARY STENT INTERVENTION N/A 10/20/2022   Procedure: CORONARY STENT INTERVENTION;  Surgeon: Elmira Newman PARAS, MD;  [facility]: [facility];  Service: Cardiovascular;  Laterality: N/A;   CORONARY ULTRASOUND/IVUS N/A 05/26/2022   Procedure: Intravascular Ultrasound/IVUS;  Surgeon: Elmira Newman PARAS, MD;  [facility]: [facility];  Service: Cardiovascular;  Laterality: N/A;   CORONARY ULTRASOUND/IVUS N/A 10/20/2022   Procedure: Intravascular Ultrasound/IVUS;  Surgeon: Elmira Newman PARAS, MD;  [facility]: [facility];  Service: Cardiovascular;  Laterality: N/A;   LEFT HEART CATH AND CORONARY ANGIOGRAPHY N/A 10/27/2017   Procedure: LEFT HEART CATH AND CORONARY ANGIOGRAPHY;  Surgeon: Elmira Newman PARAS, MD;  [facility]: [facility];  Service: Cardiovascular;  Laterality: N/A;   LEFT HEART CATH AND CORONARY ANGIOGRAPHY N/A 11/17/2019   Procedure: LEFT HEART CATH AND CORONARY ANGIOGRAPHY;  Surgeon: Elmira Newman PARAS, MD;  [facility]: [facility];  Service: Cardiovascular;  Laterality: N/A;   LEFT HEART CATH AND CORONARY ANGIOGRAPHY N/A 03/10/2022   Procedure: LEFT HEART CATH AND CORONARY ANGIOGRAPHY;  Surgeon: Ladona Heinz, MD;  [facility];  Service: Cardiovascular;  Laterality: N/A;   LEFT HEART  CATH AND CORONARY ANGIOGRAPHY N/A 07/27/2022   Procedure: LEFT HEART CATH AND CORONARY ANGIOGRAPHY;  Surgeon: Ladona Heinz, MD;  [facility]: [facility];  Service: Cardiovascular;  Laterality: N/A;   LEFT HEART CATH AND CORONARY ANGIOGRAPHY N/A 10/20/2022   Procedure: LEFT HEART CATH AND CORONARY ANGIOGRAPHY;  Surgeon: Elmira Newman PARAS, MD;  [facility];  Service: Cardiovascular;  Laterality: N/A;   LEFT HEART CATHETERIZATION WITH CORONARY  ANGIOGRAM N/A 11/17/2011   Procedure: LEFT HEART CATHETERIZATION WITH CORONARY ANGIOGRAM;  Surgeon: Erick JONELLE Ladona, MD;  Location: [facility];  Service: Cardiovascular;  Laterality: N/A;   OVARIAN CYST REMOVAL     TUBAL LIGATION  2006   ULTRASOUND GUIDANCE FOR VASCULAR ACCESS  10/27/2017   Procedure: Ultrasound Guidance For Vascular Access;  Surgeon: Elmira Newman PARAS, MD;  Location: [facility];  Service: Cardiovascular;;   Patient Active Problem List   Diagnosis Date Noted   Coronary artery disease involving native coronary artery of native heart without angina pectoris    Pleuritic pain 07/25/2022   History of non-ST elevation myocardial infarction (NSTEMI)    History of coronary angioplasty with insertion of stent    Mixed hyperlipidemia    Post PTCA 05/26/2022   Chronic iron  deficiency anemia 03/10/2022   Chronic diastolic CHF (congestive heart failure) (HCC) 03/10/2022   Non-insulin  dependent type 2 diabetes mellitus (HCC) 03/10/2022   Morbidly obese (HCC) 08/19/2020   Uncontrolled type 2 diabetes mellitus with hyperglycemia (HCC) 08/19/2020   Unstable angina (HCC) 11/16/2019   Non-ST elevation (NSTEMI) myocardial infarction (HCC) 10/27/2017   OSA (obstructive sleep apnea) 02/21/2013   Atherosclerosis of native coronary artery of native heart with stable angina pectoris 11/18/2011   Dyslipidemia 11/18/2011   Precordial pain 11/17/2011   Abnormal cardiovascular stress test 11/17/2011   DVT (deep venous thrombosis) (HCC) 11/01/2011   Essential hypertension 11/01/2011   Blood loss anemia 11/01/2011   Exertional dyspnea 08/19/2011    PCP: Schuyler Lesser, PA-C  REFERRING PROVIDER: Tobie Tonita POUR, DO  REFERRING DIAG: R20.0 (ICD-10-CM) - Left arm numbness  THERAPY DIAG:  Muscle weakness (generalized)  Acute pain of left shoulder  Pain in left arm  Neck pain  Rationale for Evaluation and Treatment: Rehabilitation  ONSET DATE: 04/10/2024- date of  referral  SUBJECTIVE:                                                                                                                                                                                                         SUBJECTIVE STATEMENT: Pt reports  short term relief with E-stim and desensitization from last session but symptoms are back with tenderness. Hand dominance: Right  PERTINENT HISTORY:  CHF, CAD, DM  PAIN:  Are you having pain? Yes: NPRS scale: 0/10 Pain location: L side of neck, L arm Pain description: radiating, heavy, tingling Aggravating factors: unsure Relieving factors: unsure  PRECAUTIONS: None  RED FLAGS: None     WEIGHT BEARING RESTRICTIONS: No  FALLS:  Has patient fallen in last 6 months? No  PLOF: Independent  PATIENT GOALS: improve pain  MD appt: 08/17/24  OBJECTIVE:  Note: Objective measures were completed at Evaluation unless otherwise noted.  DIAGNOSTIC FINDINGS:  MRI of Brain: 02/17/24   IMPRESSION: 1. No acute intracranial abnormality. 2. Moderate cerebral white matter disease, nonspecific, but most likely related chronic microvascular ischemic disease. Probable tiny remote left cerebellar infarct.  PATIENT SURVEYS:   COGNITION: Overall cognitive status: Within functional limits for tasks assessed  PALPATION: GH capsule hypomobility grossly   CERVICAL ROM:   Active ROM A/PROM (deg) eval AROM 06/12/24  Flexion 75   Extension 70   Right lateral flexion    Left lateral flexion    Right rotation 75 90  Left rotation 80 60   (Blank rows = not tested)  UPPER EXTREMITY ROM:  Active/Passive ROM Right eval Left eval Left 05/17/24 Left 06/20/24 Left 07/07/24 (after injection) Left 07/24/24 active  Shoulder flexion 180/180 120/120 135/140 85 pain (AROM) 145/170 160   Shoulder abduction 180/180 90/100 130/130  145/170 160  Shoulder extension        Shoulder adduction        Shoulder extension        Shoulder internal  rotation T6 T12    T12  Shoulder external rotation T2 L occiput      Elbow flexion        Elbow extension        Wrist flexion        Wrist extension        Wrist ulnar deviation        Wrist radial deviation        Wrist pronation        Wrist supination         (Blank rows = not tested)  UPPER EXTREMITY MMT:  MMT Right eval Left eval Left  Left 05/17/24 Left 06/20/24 Right 06/20/24 Left 07/24/24  Shoulder flexion 5/5 3+/5     4  Shoulder extension         Shoulder abduction 5/5 3+/5     4  Shoulder adduction         Shoulder extension         Shoulder internal rotation 5/5 3+/5     4  Shoulder external rotation 5/5 3+/5     4  Middle trapezius         Lower trapezius         Elbow flexion 5/5 3+/5     5  Elbow extension 5/5 3+/5     4  Wrist flexion         Wrist extension         Wrist ulnar deviation         Wrist radial deviation         Wrist pronation         Wrist supination         Grip strength 69 25 71 28 14.9 lb 54 lbs 62 lbs (63lbs on R UE)   (  Blank rows = not tested)   Extreme difficulty/unable (0), Quite a bit of difficulty (1), Moderate difficulty (2), Little difficulty (3), No difficulty (4) Survey date:  05/17/24 07/07/24  Any of your usual work, household or school activities 1 2  2. Your usual hobbies, recreational/sport activities 0 0   3. Lifting a bag of groceries to waist level 1 2   4. Lifting a bag of groceries above your head 0 1  5. Grooming your hair 2 3  6. Pushing up on your hands (I.e. from bathtub or chair) 3 3  7. Preparing food (I.e. peeling/cutting) 0 2  8. Driving  0 3  9. Vacuuming, sweeping, or raking 1 2  10. Dressing  4 4  11. Doing up buttons 4 4  12. Using tools/appliances 1 2  13. Opening doors 3 4  14. Cleaning  0 2  15. Tying or lacing shoes 3 4  16. Sleeping  2 2  17. Laundering clothes (I.e. washing, ironing, folding) 0 2  18. Opening a jar 0 2  19. Throwing a ball 2 2  20. Carrying a small suitcase with your  affected limb.  0 1  Score total:  27/80  47/80   Cervical   TREATMENT DATE: Next MD appt 08/17/24  Pt educated on TPDN and gave handout (see patient instructions) SciFit: level 5 for 10', Pt reported constant pressure sensation L shoulder which didn't get worst with time. Seated scalene stretch: 3 x 30 R and L Pt demonstrated allodynia with light tapping (non noxious) over L upper trap, L middle trap and L cervical paraspinalis   Desensitization: using wash cloth over hypersensitive area of L upper trap, left cervical paraspinalis and L periscapular region 10' Myofascial release to  E-stim: IFC setting 9m A for 10' to upper thoracic region, including bil upper traps, with hotpack over L  (pt's ride came in sooner so was only able to stay for 10' of E-stim intead of 20' as intended) Pt educated on chronic pain and one of the ways to help decrease sensitivity is by placing conscious input of relaxing shoulder/neck muscles when there is higher level of pain despite not performing any painful activities.   PATIENT EDUCATION:  Education details: see above Person educated: Patient Education method: Explanation Education comprehension: verbalized understanding  HOME EXERCISE PROGRAM:   Access Code: [email or web address] URL: [email or web address] Date: 07/11/2024 Prepared by: Raj Blanch  Exercises - Shoulder Flexion Wall Slide with Towel  - 3 x daily - 7 x weekly - 15 reps - Seated Shoulder Flexion AAROM with Pulley Behind  - 3 x daily - 7 x weekly - Standing Shoulder Row with Anchored Resistance  - 1 x daily - 7 x weekly - 2 sets - 10 reps - Shoulder extension with resistance - Neutral  - 1 x daily - 7 x weekly - 2 sets - 10 reps - Seated Cervical Sidebending Stretch  - 1-2 x daily - 7 x weekly - 3-5 reps - 20 sec hold ASSESSMENT:  CLINICAL IMPRESSION: Pt continues to demonstrate hypersensitivity over L cervical/thoracic region. Pt reported decreased pain and sensitivity to  light tough with desensitization technique and E-stim at end of the session.  OBJECTIVE IMPAIRMENTS: decreased ROM, decreased strength, hypomobility, increased fascial restrictions, increased muscle spasms, impaired flexibility, impaired UE functional use, postural dysfunction, and pain.   ACTIVITY LIMITATIONS: carrying and sleeping  PARTICIPATION LIMITATIONS: meal prep, cleaning, laundry, driving, shopping, and community activity  PERSONAL FACTORS: Age and Time  since onset of injury/illness/exacerbation are also affecting patient's functional outcome.   REHAB POTENTIAL: Good  CLINICAL DECISION MAKING: Stable/uncomplicated  EVALUATION COMPLEXITY: Moderate   GOALS: Goals reviewed with patient? Yes  SHORT TERM GOALS: No STG established due to POC <4 weeks   LONG TERM GOALS: Target date: 09/01/2024     Patient will be 100% compliant with HEP to self manage symptoms at home  Baseline: initiated 04/14/24 Goal status: progressing  2.  Patient will demo at least 10% improvement on UEFS to improve overall function. Baseline: TBD; 27/80 (05/17/24); 47/80 Goal status: Met 07/07/24  3.  Patient will demo grip strength in L UE to be >50 lbs to improve ability to hold and carry objects in L hand Baseline: 29 lbs (04/14/24); 28 lbs (05/17/24) Goal status: progressing  4.  Patient will demo L shoulder AROM to be above 160 deg to improve ability to reach in overhead cabinets. Baseline: 120 deg (eval); 135 deg (05/17/24) Goal status: progressing  5.  Pt will report overall max pain of <5/10 in L shoulder and L arm to improve function with L arm Baseline: 10/10 (04/14/24); 6-8/10 at worst (05/17/24) Goal status: progressing  6.  Patient will demo at least UEFS score of >60/80 to improve overall function. Baseline: TBD; 27/80 (05/17/24); 47/80 (07/04/24) Goal status: Revised 07/07/24   PLAN:  PT FREQUENCY: 3x/week for next 6 sessions and then 2x/week for 8 additional sessions.  PT  DURATION: 10 weeks  PLANNED INTERVENTIONS: 97164- PT Re-evaluation, 97750- Physical Performance Testing, 97110-Therapeutic exercises, 97530- Therapeutic activity, 97112- Neuromuscular re-education, 97535- Self Care, [id number]- Manual therapy, [id number]- Electrical stimulation (unattended), Patient/Family education, Joint mobilization, Joint manipulation, Spinal manipulation, Spinal mobilization, DME instructions, Cryotherapy, and Moist heat  PLAN FOR NEXT SESSION: Aprogress HEP   Raj LOISE Blanch, PT 08/17/2024, 11:02 AM

## 2024-08-17 NOTE — Patient Instructions (Addendum)

## 2024-08-20 MED ORDER — TRIAMCINOLONE ACETONIDE 40 MG/ML IJ SUSP
20.0000 mg | INTRAMUSCULAR | Status: AC | PRN
  Administered 2024-08-17: 20 mg via INTRA_ARTICULAR

## 2024-08-20 MED ORDER — BUPIVACAINE HCL 0.25 % IJ SOLN
0.6600 mL | INTRAMUSCULAR | Status: AC | PRN
  Administered 2024-08-17: .66 mL via INTRA_ARTICULAR

## 2024-08-20 MED ORDER — LIDOCAINE HCL 1 % IJ SOLN
3.0000 mL | INTRAMUSCULAR | Status: AC | PRN
  Administered 2024-08-17: 3 mL

## 2024-08-21 ENCOUNTER — Encounter: Payer: Self-pay | Admitting: Radiology

## 2024-08-29 ENCOUNTER — Other Ambulatory Visit: Payer: Self-pay | Admitting: Cardiology

## 2024-09-05 ENCOUNTER — Telehealth: Payer: Self-pay | Admitting: Cardiology

## 2024-09-05 MED ORDER — ISOSORBIDE MONONITRATE ER 60 MG PO TB24: 60.0000 mg | ORAL_TABLET | Freq: Every day | ORAL | 1 refills | Status: AC

## 2024-09-05 NOTE — Telephone Encounter (Signed)
 Pt's medication was sent to pt's pharmacy as requested. Confirmation received.

## 2024-09-05 NOTE — Telephone Encounter (Signed)
*  STAT* If patient is at the pharmacy, call can be transferred to refill team.   1. Which medications need to be refilled? (please list name of each medication and dose if known)   isosorbide  mononitrate (IMDUR ) 60 MG 24 hr tablet    2. Which pharmacy/location (including street and city if local pharmacy) is medication to be sent to?  [id number] - [place], [place] - [address] AT [address]      3. Do they need a 30 day or 90 day supply? 90 day  Pt is out of medication

## 2024-09-15 ENCOUNTER — Encounter: Payer: Self-pay | Admitting: Emergency Medicine

## 2024-09-15 ENCOUNTER — Ambulatory Visit: Admission: EM | Admit: 2024-09-15 | Discharge: 2024-09-15 | Disposition: A | Discharge status: Home / Self Care

## 2024-09-15 DIAGNOSIS — D573 Sickle-cell trait: Secondary | ICD-10-CM | POA: Insufficient documentation

## 2024-09-15 DIAGNOSIS — S8000XA Contusion of unspecified knee, initial encounter: Secondary | ICD-10-CM

## 2024-09-15 DIAGNOSIS — W01198A Fall on same level from slipping, tripping and stumbling with subsequent striking against other object, initial encounter: Secondary | ICD-10-CM

## 2024-09-15 DIAGNOSIS — M5430 Sciatica, unspecified side: Secondary | ICD-10-CM | POA: Insufficient documentation

## 2024-09-15 DIAGNOSIS — N83209 Unspecified ovarian cyst, unspecified side: Secondary | ICD-10-CM | POA: Insufficient documentation

## 2024-09-15 DIAGNOSIS — I252 Old myocardial infarction: Secondary | ICD-10-CM | POA: Insufficient documentation

## 2024-09-15 DIAGNOSIS — E559 Vitamin D deficiency, unspecified: Secondary | ICD-10-CM | POA: Insufficient documentation

## 2024-09-15 DIAGNOSIS — N951 Menopausal and female climacteric states: Secondary | ICD-10-CM | POA: Insufficient documentation

## 2024-09-15 DIAGNOSIS — M5442 Lumbago with sciatica, left side: Secondary | ICD-10-CM | POA: Insufficient documentation

## 2024-09-15 DIAGNOSIS — N920 Excessive and frequent menstruation with regular cycle: Secondary | ICD-10-CM | POA: Insufficient documentation

## 2024-09-15 DIAGNOSIS — Z6841 Body Mass Index (BMI) 40.0 and over, adult: Secondary | ICD-10-CM | POA: Insufficient documentation

## 2024-09-15 DIAGNOSIS — E1165 Type 2 diabetes mellitus with hyperglycemia: Secondary | ICD-10-CM | POA: Insufficient documentation

## 2024-09-15 DIAGNOSIS — N946 Dysmenorrhea, unspecified: Secondary | ICD-10-CM | POA: Insufficient documentation

## 2024-09-15 DIAGNOSIS — M179 Osteoarthritis of knee, unspecified: Secondary | ICD-10-CM | POA: Insufficient documentation

## 2024-09-15 DIAGNOSIS — U071 COVID-19: Secondary | ICD-10-CM | POA: Insufficient documentation

## 2024-09-15 MED ORDER — KETOROLAC TROMETHAMINE 30 MG/ML IJ SOLN
30.0000 mg | Freq: Once | INTRAMUSCULAR | Status: AC
  Administered 2024-09-15: 30 mg via INTRAMUSCULAR

## 2024-09-15 MED ORDER — DICLOFENAC SODIUM 75 MG PO TBEC: 75.0000 mg | DELAYED_RELEASE_TABLET | Freq: Two times a day (BID) | ORAL | 0 refills | Status: AC

## 2024-09-15 NOTE — ED Provider Notes (Signed)
 [facility]    CSN: [id number] Arrival date & time: 09/15/24  1708      History   Chief Complaint Chief Complaint  Patient presents with   Fall   Knee Pain    HPI [name] is a 58 y.o. female.   Pt presents today due to trip and fall at [facility] yesterday. Pt denies hitting head, states that she fell onto both of her knees. Pt states that she is spearing seeing 10/10 pain.  Patient is able to bear weight on bilateral lower extremities.  Patient states that she took 400 mg of ibuprofen  once last night with no relief.  Patient denies use of ice.  Patient has a superficial abrasion of right knee.  The history is provided by the patient.  Fall  Knee Pain   Past Medical History:  Diagnosis Date   Anemia    CAD (coronary artery disease)    PCI with stent to RCA in 2019   CHF (congestive heart failure) (HCC) 04/2011   EF 15-20% from echo 05/19/11   DOE (dyspnea on exertion)    DVT (deep venous thrombosis) (HCC) 06/2011   LLE   Fatigue    HTN (hypertension)    Hyperlipidemia    Menorrhagia    with iron  deficient anemia   NSTEMI (non-ST elevated myocardial infarction) (HCC) 10/26/2017   NSTEMI (non-ST elevated myocardial infarction) (HCC) 10/27/2017   Obesity    Orthopnea    Type II diabetes mellitus (HCC)     Patient Active Problem List   Diagnosis Date Noted   H/O non-ST elevation myocardial infarction (NSTEMI) 09/15/2024   Acute bilateral low back pain with left-sided sciatica 09/15/2024   Body mass index (BMI) 45.0-49.9, adult (HCC) 09/15/2024   COVID-19 09/15/2024   Cyst of ovary 09/15/2024   Dysmenorrhea 09/15/2024   Hyperglycemia due to type 2 diabetes mellitus (HCC) 09/15/2024   Menorrhagia 09/15/2024   Osteoarthritis of knee 09/15/2024   Menopausal symptom 09/15/2024   Sciatica 09/15/2024   Sickle cell trait 09/15/2024   Vitamin D deficiency 09/15/2024   Impacted cerumen of left ear 01/26/2024   Other specified symptoms and signs  involving the circulatory and respiratory systems 01/26/2024   Pain in finger of right hand 05/19/2023   Cervical radiculopathy 03/16/2023   Anterior epistaxis 12/16/2022   Angina concurrent with and due to arteriosclerosis of coronary artery 09/15/2022   Coronary artery disease involving native coronary artery of native heart without angina pectoris    Pleuritic pain 07/25/2022   History of coronary angioplasty with insertion of stent    Post PTCA 05/26/2022   Chronic iron  deficiency anemia 03/10/2022   Chronic diastolic CHF (congestive heart failure) (HCC) 03/10/2022   Type 2 diabetes mellitus (HCC) 03/10/2022   Morbid obesity (HCC) 08/19/2020   Uncontrolled type 2 diabetes mellitus with hyperglycemia (HCC) 08/19/2020   Unstable angina (HCC) 11/16/2019   Hyperlipidemia 03/05/2018   Old myocardial infarction 10/26/2017   Acute pain of right shoulder 07/07/2016   Chronic pain of left knee 07/07/2016   OSA (obstructive sleep apnea) 02/21/2013   Coronary arteriosclerosis 11/18/2011   Dyslipidemia 11/18/2011   Precordial pain 11/17/2011   Abnormal cardiovascular stress test 11/17/2011   Deep venous thrombosis (HCC) 11/01/2011   Hypertensive disorder 11/01/2011   Anemia 11/01/2011   Exertional dyspnea 08/19/2011   Congestive heart failure (HCC) 04/19/2011    Past Surgical History:  Procedure Laterality Date   CORONARY ANGIOPLASTY WITH STENT PLACEMENT  2013;  10/27/2017  CORONARY BALLOON ANGIOPLASTY N/A 05/26/2022   Procedure: CORONARY BALLOON ANGIOPLASTY;  Surgeon: Elmira Newman PARAS, MD;  Location: [facility];  Service: Cardiovascular;  Laterality: N/A;   CORONARY STENT INTERVENTION N/A 10/27/2017   Procedure: CORONARY STENT INTERVENTION;  Surgeon: Elmira Newman PARAS, MD;  Location: [facility];  Service: Cardiovascular;  Laterality: N/A;   CORONARY STENT INTERVENTION N/A 03/10/2022   Procedure: CORONARY STENT INTERVENTION;  Surgeon: Ladona Heinz, MD;  Location: [facility];  Service: Cardiovascular;  Laterality: N/A;   CORONARY STENT INTERVENTION N/A 03/11/2022   Procedure: CORONARY STENT INTERVENTION;  Surgeon: Elmira Newman PARAS, MD;  Location: [facility];  Service: Cardiovascular;  Laterality: N/A;  aborted   CORONARY STENT INTERVENTION N/A 10/20/2022   Procedure: CORONARY STENT INTERVENTION;  Surgeon: Elmira Newman PARAS, MD;  Location: [facility];  Service: Cardiovascular;  Laterality: N/A;   CORONARY ULTRASOUND/IVUS N/A 05/26/2022   Procedure: Intravascular Ultrasound/IVUS;  Surgeon: Elmira Newman PARAS, MD;  Location: [facility];  Service: Cardiovascular;  Laterality: N/A;   CORONARY ULTRASOUND/IVUS N/A 10/20/2022   Procedure: Intravascular Ultrasound/IVUS;  Surgeon: Elmira Newman PARAS, MD;  Location: [facility];  Service: Cardiovascular;  Laterality: N/A;   LEFT HEART CATH AND CORONARY ANGIOGRAPHY N/A 10/27/2017   Procedure: LEFT HEART CATH AND CORONARY ANGIOGRAPHY;  Surgeon: Elmira Newman PARAS, MD;  Location: [facility];  Service: Cardiovascular;  Laterality: N/A;   LEFT HEART CATH AND CORONARY ANGIOGRAPHY N/A 11/17/2019   Procedure: LEFT HEART CATH AND CORONARY ANGIOGRAPHY;  Surgeon: Elmira Newman PARAS, MD;  Location: [facility];  Service: Cardiovascular;  Laterality: N/A;   LEFT HEART CATH AND CORONARY ANGIOGRAPHY N/A 03/10/2022   Procedure: LEFT HEART CATH AND CORONARY ANGIOGRAPHY;  Surgeon: Ladona Heinz, MD;  [facility]: [facility];  Service: Cardiovascular;  Laterality: N/A;   LEFT HEART CATH AND CORONARY ANGIOGRAPHY N/A 07/27/2022   Procedure: LEFT HEART CATH AND CORONARY ANGIOGRAPHY;  Surgeon: Ladona Heinz, MD;  [facility];  Service: Cardiovascular;  Laterality: N/A;   LEFT HEART CATH AND CORONARY ANGIOGRAPHY N/A 10/20/2022   Procedure: LEFT HEART CATH AND CORONARY ANGIOGRAPHY;  Surgeon: Elmira Newman PARAS, MD;  [facility];  Service: Cardiovascular;   Laterality: N/A;   LEFT HEART CATHETERIZATION WITH CORONARY ANGIOGRAM N/A 11/17/2011   Procedure: LEFT HEART CATHETERIZATION WITH CORONARY ANGIOGRAM;  Surgeon: Erick JONELLE Ladona, MD;  Location: [facility];  Service: Cardiovascular;  Laterality: N/A;   OVARIAN CYST REMOVAL     TUBAL LIGATION  2006   ULTRASOUND GUIDANCE FOR VASCULAR ACCESS  10/27/2017   Procedure: Ultrasound Guidance For Vascular Access;  Surgeon: Elmira Newman PARAS, MD;  Location: [facility];  Service: Cardiovascular;;    OB History   No obstetric history on file.      Home Medications    Prior to Admission medications   Medication Sig Start Date End Date Taking? Authorizing Provider  ACCU-CHEK GUIDE TEST test strip  10/26/23  Yes [provider]  Accu-Chek Softclix Lancets lancets 2 (two) times daily. 09/29/23  Yes [provider]  Acetaminophen  500 MG capsule    Yes [provider]  amLODipine  (NORVASC ) 10 MG tablet Take 1 tablet (10 mg total) by mouth daily. 12/08/23  Yes Patwardhan, Newman PARAS, MD  aspirin  81 MG chewable tablet Chew 1 tablet (81 mg total) by mouth in the morning. 12/08/23  Yes Patwardhan, Manish J,  MD  atorvastatin  (LIPITOR ) 40 MG tablet Take 1 tablet (40 mg total) by mouth every evening. 12/08/23  Yes Patwardhan, Newman PARAS, MD  Blood Glucose Monitoring Suppl (ACCU-CHEK GUIDE ME) w/Device KIT See admin instructions. 10/04/23  Yes [provider]  carvedilol  (COREG ) 25 MG tablet 1 tab(s) orally 2 times a day; Duration: 90 days   Yes [provider]  diclofenac (VOLTAREN) 75 MG EC tablet Take 1 tablet (75 mg total) by mouth 2 (two) times daily. 09/15/24  Yes Andra Krabbe C, PA-C  Evolocumab  (REPATHA  SURECLICK) 140 MG/ML SOAJ Inject 140 mg into the skin every 14 (fourteen) days. 07/28/24  Yes Patwardhan, Manish J, MD  Ferrous Sulfate  (IRON ) 325 (65 FE) MG TABS Take 325 mg by mouth. Twice a week   Yes [provider]  furosemide  (LASIX ) 40 MG  tablet Take 1 tablet (40 mg total) by mouth as needed. 12/08/23  Yes Patwardhan, Newman PARAS, MD  isosorbide  mononitrate (IMDUR ) 60 MG 24 hr tablet Take 1 tablet (60 mg total) by mouth daily. 09/05/24  Yes Patwardhan, Manish J, MD  JARDIANCE  10 MG TABS tablet Take 1 tablet (10 mg total) by mouth daily. 12/08/23  Yes Patwardhan, Manish J, MD  losartan  (COZAAR ) 25 MG tablet TAKE 1 TABLET(25 MG) BY MOUTH DAILY 08/30/24  Yes Patwardhan, Manish J, MD  metoprolol  succinate (TOPROL -XL) 100 MG 24 hr tablet TAKE 1 TABLET(100 MG) BY MOUTH DAILY WITH OR IMMEDIATELY FOLLOWING A MEAL 06/06/24  Yes Patwardhan, Manish J, MD  OZEMPIC, 1 MG/DOSE, 4 MG/3ML SOPN SMARTSIG:1 Milligram(s) Topical Once a Week 09/29/23  Yes [provider]  ranolazine  (RANEXA ) 1000 MG SR tablet Take 1 tablet (1,000 mg total) by mouth 2 (two) times daily. 01/10/24  Yes Patwardhan, Newman PARAS, MD  VITAMIN D, ERGOCALCIFEROL, PO Vitamin D (Ergocalciferol)   Yes [provider]  albuterol  (VENTOLIN  HFA) 108 (90 Base) MCG/ACT inhaler Inhale 2 puffs every 4 hours by inhalation route.    [provider]  ARLEENE COVID-19 AG HOME TEST KIT  06/04/23   [provider]  Blood Pressure Monitoring (BLOOD PRESSURE CUFF) MISC 1 each by Does not apply route daily. Patient not taking: Reported on 09/15/2024 08/05/23   Wyn Jackee VEAR Mickey., NP  cyclobenzaprine  (FLEXERIL ) 5 MG tablet TAKE 1 TABLET BY MOUTH AS NEEDED FOR MUSCLE SPASM/PAIN. Patient not taking: Reported on 09/15/2024    [provider]  FLUoxetine (PROZAC) 10 MG capsule Take by mouth daily. Patient not taking: Reported on 09/15/2024    [provider]  fluticasone -salmeterol (ADVAIR DISKUS) 100-50 MCG/ACT AEPB Inhale 1 puff into the lungs 2 (two) times daily. Patient not taking: Reported on 09/15/2024 03/27/24   Isadora Hose, MD  gabapentin (NEURONTIN) 300 MG capsule 1 cap(s) orally 2 times a day; Duration: 30 day(s) Patient not taking: Reported on  09/15/2024    [provider]  glipiZIDE (GLUCOTROL XL) 5 MG 24 hr tablet 1 tab(s) orally once a day; Duration: 90 days Patient not taking: Reported on 09/15/2024    [provider]  HYDROCHLOROTHIAZIDE  PO hydroCHLOROthiazide  Patient not taking: Reported on 09/15/2024    [provider]  leuprolide (LUPRON DEPOT, 30-MONTH,) 3.75 MG injection     [provider]  metFORMIN (GLUCOPHAGE) 500 MG tablet Take 500 mg by mouth 2 (two) times daily. Patient not taking: Reported on 09/15/2024 10/26/23   [provider]  nitroGLYCERIN  (NITROSTAT ) 0.4 MG SL tablet PLACE 1 TABLET UNDER TONGUE EVERY 5 MINS AS NEEDED FOR CHEST  PAIN Patient not taking: Reported on 09/15/2024 06/23/23   Elmira Newman PARAS, MD  pantoprazole  (PROTONIX ) 40 MG tablet Take 1 tablet by mouth daily. Patient not taking: Reported on 09/15/2024 05/27/23   [provider]  Semaglutide (OZEMPIC, 0.25 OR 0.5 MG/DOSE, Sharon) Inject 0.5 mg into the skin every Sunday. Patient not taking: Reported on 09/15/2024    [provider]    Family History Family History  Problem Relation Age of Onset   Hypertension Mother    Stroke Father    Heart attack Sister    Stroke Sister     Social History Social History   Tobacco Use   Smoking status: Former    Current packs/day: 0.00    Average packs/day: 0.5 packs/day for 10.0 years (5.0 ttl pk-yrs)    Types: Cigarettes    Start date: 10/20/1983    Quit date: 10/19/1993    Years since quitting: 30.9   Smokeless tobacco: Never  Vaping Use   Vaping status: Never Used  Substance Use Topics   Alcohol use: No   Drug use: No     Allergies   Tolterodine tartrate   Review of Systems Review of Systems   Physical Exam Triage Vital Signs ED Triage Vitals  Encounter Vitals Group     BP 09/15/24 1736 (!) 144/89     Girls Systolic BP Percentile --      Girls Diastolic BP Percentile --      Boys Systolic BP Percentile --      Boys  Diastolic BP Percentile --      Pulse Rate 09/15/24 1736 83     Resp 09/15/24 1736 14     Temp 09/15/24 1736 97.8 F (36.6 C)     Temp Source 09/15/24 1736 Oral     SpO2 09/15/24 1736 98 %     Weight --      Height --      Head Circumference --      Peak Flow --      Pain Score 09/15/24 1737 10     Pain Loc --      Pain Education --      Exclude from Growth Chart --    No data found.  Updated Vital Signs BP (!) 144/89 (BP Location: Left Arm)   Pulse 83   Temp 97.8 F (36.6 C) (Oral)   Resp 14   LMP 06/16/2019   SpO2 98%   Visual Acuity Right Eye Distance:   Left Eye Distance:   Bilateral Distance:    Right Eye Near:   Left Eye Near:    Bilateral Near:     Physical Exam Vitals and nursing note reviewed.  Constitutional:      General: She is not in acute distress.    Appearance: Normal appearance. She is not ill-appearing, toxic-appearing or diaphoretic.  Eyes:     General: No scleral icterus. Cardiovascular:     Rate and Rhythm: Normal rate and regular rhythm.     Heart sounds: Normal heart sounds.  Pulmonary:     Effort: Pulmonary effort is normal. No respiratory distress.     Breath sounds: Normal breath sounds. No wheezing or rhonchi.  Musculoskeletal:     Right knee: Laceration (superficial abrasion) present. No swelling, deformity, effusion, erythema or ecchymosis. Decreased range of motion. Tenderness present.     Left knee: No swelling, deformity, effusion, erythema, ecchymosis or lacerations. Decreased range of motion. Tenderness present.  Skin:    General: Skin  is warm.  Neurological:     Mental Status: She is alert and oriented to person, place, and time.  Psychiatric:        Mood and Affect: Mood normal.        Behavior: Behavior normal.      UC Treatments / Results  Labs (all labs ordered are listed, but only abnormal results are displayed) Labs Reviewed - No data to display  EKG   Radiology No results found.  Procedures Procedures  (including critical care time)  Medications Ordered in UC Medications  ketorolac  (TORADOL ) 30 MG/ML injection 30 mg (30 mg Intramuscular Given 09/15/24 1816)    Initial Impression / Assessment and Plan / UC Course  I have reviewed the triage vital signs and the nursing notes.  Pertinent labs & imaging results that were available during my care of the patient were reviewed by me and considered in my medical decision making (see chart for details).     Pain relief with use of in office medications Final Clinical Impressions(s) / UC Diagnoses   Final diagnoses:  Fall on same level from slipping, tripping and stumbling with subsequent striking against other object, initial encounter  Contusion of knee, unspecified laterality, initial encounter     Discharge Instructions      Today you have been diagnosed with a musculoskeletal injury.  You should use ice on affected area for 20 minutes at a time a couple times a day for the first 24 hours then you may switch to heat in the same intervals.  Be sure to put a barrier between ice or heat source and skin to prevent burns.  May also wrap affected area and Ace bandage if tolerated and appropriate, and elevate above the level of the heart to help reduce swelling.  Do not wrap Ace bandages around neck or torso as wrapping too tight can restrict air movement inability to breathe.  If symptoms do not seem to be improving in 3 to 5 days after following these instructions we need to follow-up with orthopedist or PCP.      ED Prescriptions     Medication Sig Dispense Auth. Provider   diclofenac (VOLTAREN) 75 MG EC tablet Take 1 tablet (75 mg total) by mouth 2 (two) times daily. 30 tablet Andra Corean BROCKS, PA-C      PDMP not reviewed this encounter.   Andra Corean BROCKS, PA-C 09/15/24 1839

## 2024-09-15 NOTE — ED Triage Notes (Signed)
 Pt reports fall that occurred yesterday morning at [employer] - she slipped stepping out of the door onto the concrete and fell forward on both of her knees. Pt reports abrasions to both knees with slight swelling and bruising. Pt notes she is able to walk, but it is painful since the fall. Has taken ibuprofen  with no relief. No ice or heat applied to areas.

## 2024-09-15 NOTE — Discharge Instructions (Addendum)
 Today you have been diagnosed with a musculoskeletal injury.  You should use ice on affected area for 20 minutes at a time a couple times a day for the first 24 hours then you may switch to heat in the same intervals.  Be sure to put a barrier between ice or heat source and skin to prevent burns.  May also wrap affected area and Ace bandage if tolerated and appropriate, and elevate above the level of the heart to help reduce swelling.  Do not wrap Ace bandages around neck or torso as wrapping too tight can restrict air movement inability to breathe.  If symptoms do not seem to be improving in 3 to 5 days after following these instructions we need to follow-up with orthopedist or PCP.

## 2024-09-22 ENCOUNTER — Other Ambulatory Visit: Payer: Self-pay | Admitting: Cardiology

## 2024-09-22 ENCOUNTER — Telehealth: Payer: Self-pay | Admitting: Cardiology

## 2024-09-22 MED ORDER — RANOLAZINE ER 1000 MG PO TB12: 1000.0000 mg | ORAL_TABLET | Freq: Two times a day (BID) | ORAL | 0 refills | Status: DC

## 2024-09-22 NOTE — Telephone Encounter (Signed)
*  STAT* If patient is at the pharmacy, call can be transferred to refill team.   1. Which medications need to be refilled? (please list name of each medication and dose if known)   ranolazine  (RANEXA ) 1000 MG SR tablet    2. Which pharmacy/location (including street and city if local pharmacy) is medication to be sent to?  [id number] - [place], [place] - [address] AT [address]      3. Do they need a 30 day or 90 day supply? 90 day    Pt is out of medication

## 2024-09-22 NOTE — Telephone Encounter (Signed)
 Refill sent

## 2024-09-28 ENCOUNTER — Other Ambulatory Visit: Payer: Self-pay

## 2024-09-28 MED ORDER — RANOLAZINE ER 1000 MG PO TB12: 1000.0000 mg | ORAL_TABLET | Freq: Two times a day (BID) | ORAL | 1 refills | Status: AC

## 2024-09-29 ENCOUNTER — Ambulatory Visit: Admitting: Orthopedic Surgery

## 2024-10-27 ENCOUNTER — Encounter: Payer: Self-pay | Admitting: Orthopedic Surgery

## 2024-10-27 ENCOUNTER — Ambulatory Visit: Admitting: Orthopedic Surgery

## 2024-10-27 DIAGNOSIS — R202 Paresthesia of skin: Secondary | ICD-10-CM | POA: Diagnosis not present

## 2024-10-27 NOTE — Progress Notes (Signed)
 "  Office Visit Note   Patient: [name]           Date of Birth: 08/04/1966           MRN: [id number] Visit Date: 10/27/2024 Requested by: Linde Bitters, PA-C [address] [place],  [place] [address] PCP: Linde Bitters, PA-C  Subjective: Chief Complaint  Patient presents with   Left Shoulder - Follow-up    HPI: [name] is a 59 y.o. female who presents to the office reporting continued left shoulder pain.  Patient had left shoulder AC joint injection 07/24/2024.  Good relief from that injection for 2 to 3 weeks approaching 100%.  Now the pain has started to return.  She does report some numbness and tingling on the dorsal and palmar aspect of the hand.  Patient does have moderate carpal tunnel syndrome by nerve study testing.  She had that [facility] joint injection which gave her much more relief than neck injection.  She does have severe left-sided C4-C5-C6 and C7 foraminal stenosis as well as right-sided foraminal stenosis severe at C5.  Is not having any real right sided symptoms.  Currently not working.  Has a cardiac history with CHF.  Daughter is at home..                ROS: All systems reviewed are negative as they relate to the chief complaint within the history of present illness.  Patient denies fevers or chills.  Assessment & Plan: Visit Diagnoses:  1. Paresthesia of right upper extremity     Plan: Impression is left AC joint arthritis with edema on MRI scan and good relief from injection.  I think arthroscopy with arthroscopic distal clavicle excision and debridement and subacromial decompression.  Evaluate rotator cuff as well.  Carpal tunnel release also indicated.  Risk and benefits of the procedure discussed with the patient including not limited to infection nerve vessel damage incomplete pain relief as well as incomplete restoration of function.  Extensive nature of the rehabilitative process is also discussed.  Would like to use CPM machine on the  shoulder postoperatively if possible.  All questions answered cardiac risk stratification indicated preoperatively  Follow-Up Instructions: No follow-ups on file.   Orders:  Orders Placed This Encounter  Procedures   Ambulatory referral to Neurology   No orders of the defined types were placed in this encounter.     Procedures: No procedures performed   Clinical Data: No additional findings.  Objective: Vital Signs: LMP 06/16/2019   Physical Exam:  Constitutional: Patient appears well-developed HEENT:  Head: Normocephalic Eyes:EOM are normal Neck: Normal range of motion Cardiovascular: Normal rate Pulmonary/chest: Effort normal Neurologic: Patient is alert Skin: Skin is warm Psychiatric: Patient has normal mood and affect  Ortho Exam: Ortho exam demonstrates diminished AC joint tenderness left versus right but it still is present to some degree.  Cervical spine range of motion intact.  Patient has 5 out of 5 grip EPL FPL interosseous wrist flexion wrist extension bicep triceps and unfortunately.  No abductor pollicis brevis wasting.  Positive carpal tunnel compression testing on the left less so on the right.  Negative Tinel's cubital tunnel. Ortho exam demonstrates bilateral shoulder range of motion of 65/105/170. Rotator cuff strength intact bilaterally to infraspinatus supraspinatus and subscap muscle testing. Does have AC joint tenderness on the left today compared to the right with some pain with crossarm adduction. O'Brien's testing is equivocal on the left negative on the right. Not  too much in terms of coarseness with internal and external rotation at 90 degrees of abduction on that left-hand side. Does have some paresthesias in that C5-C6-C7 distribution on the left not the right. Negative Tinel's cubital tunnel on that left-hand side. Wrist and elbow range of motion of the left intact.  Specialty Comments:  CLINICAL DATA:  Initial evaluation for neck pain,  left-sided radicular symptoms.   EXAM: MRI CERVICAL SPINE WITHOUT CONTRAST   TECHNIQUE: Multiplanar, multisequence MR imaging of the cervical spine was performed. No intravenous contrast was administered.   COMPARISON:  Radiograph from 06/08/2024   FINDINGS: Alignment: Examination markedly degraded by motion artifact, limiting assessment.   Straightening of the normal cervical lordosis. No significant listhesis.   Vertebrae: Vertebral body height maintained without acute or chronic fracture. Bone marrow signal intensity overall within normal limits. No worrisome osseous lesions. Degenerative reactive endplate change present about the C5-6 and C6-7 interspaces. No other visible abnormal marrow edema.   Cord: Grossly normal signal morphology on this motion degraded exam.   Posterior Fossa, vertebral arteries, paraspinal tissues: Unremarkable.   Disc levels:   C2-C3: Mild disc bulge with uncovertebral spurring. No significant spinal stenosis. Mild left C3 foraminal narrowing. Right neural foramen remains adequately patent.   C3-C4: Degenerative vertebral disc space narrowing. Left eccentric disc osteophyte complex flattens and effaces the ventral thecal sac. Mild cord flattening without cord edema changes. Mild spinal stenosis. Severe left with moderate right C4 foraminal narrowing.   C4-C5: Left subarticular to foraminal disc protrusion (series 106, image 12). Mild left greater than right uncovertebral spurring. Flattening of the ventral thecal sac with mild cord flattening and mild spinal stenosis. Severe left C5 foraminal narrowing. Right neural foramen remains patent.   C5-C6: Left eccentric disc bulge with bilateral uncovertebral spurring. Flattening and partial effacement of the ventral thecal sac with resultant mild spinal stenosis. Severe left with moderate right C6 foraminal narrowing.   C6-C7: Mild disc bulge with uncovertebral spurring. No spinal stenosis.  Severe left C7 foraminal narrowing. Right neural foramen remains patent.   C7-T1: Minimal disc bulge. Mild left-sided facet hypertrophy. No spinal stenosis. Mild left C8 foraminal narrowing. Right neural foramen remains patent.   IMPRESSION: 1. Motion degraded exam. 2. Left subarticular to foraminal disc protrusion at C4-5 with resultant mild canal and severe left C5 foraminal stenosis. 3. Left eccentric disc osteophyte complex at C3-4 with resultant mild canal, with severe left and moderate right C4 foraminal stenosis. 4. Left eccentric disc bulge with uncovertebral spurring at C5-6 with resultant mild canal, with severe left and moderate right C6 foraminal stenosis. 5. Severe left C7 foraminal narrowing related to disc bulge and uncovertebral disease.     Electronically Signed   By: Morene Hoard M.D.   On: 07/02/2024 03:54  Imaging: No results found.   PMFS History: Patient Active Problem List   Diagnosis Date Noted   H/O non-ST elevation myocardial infarction (NSTEMI) 09/15/2024   Acute bilateral low back pain with left-sided sciatica 09/15/2024   Body mass index (BMI) 45.0-49.9, adult (HCC) 09/15/2024   COVID-19 09/15/2024   Cyst of ovary 09/15/2024   Dysmenorrhea 09/15/2024   Hyperglycemia due to type 2 diabetes mellitus (HCC) 09/15/2024   Menorrhagia 09/15/2024   Osteoarthritis of knee 09/15/2024   Menopausal symptom 09/15/2024   Sciatica 09/15/2024   Sickle cell trait 09/15/2024   Vitamin D deficiency 09/15/2024   Impacted cerumen of left ear 01/26/2024   Other specified symptoms and signs involving the circulatory and respiratory  systems 01/26/2024   Pain in finger of right hand 05/19/2023   Cervical radiculopathy 03/16/2023   Anterior epistaxis 12/16/2022   Angina concurrent with and due to arteriosclerosis of coronary artery 09/15/2022   Coronary artery disease involving native coronary artery of native heart without angina pectoris    Pleuritic  pain 07/25/2022   History of coronary angioplasty with insertion of stent    Post PTCA 05/26/2022   Chronic iron  deficiency anemia 03/10/2022   Chronic diastolic CHF (congestive heart failure) (HCC) 03/10/2022   Type 2 diabetes mellitus (HCC) 03/10/2022   Morbid obesity (HCC) 08/19/2020   Uncontrolled type 2 diabetes mellitus with hyperglycemia (HCC) 08/19/2020   Unstable angina (HCC) 11/16/2019   Hyperlipidemia 03/05/2018   Old myocardial infarction 10/26/2017   Acute pain of right shoulder 07/07/2016   Chronic pain of left knee 07/07/2016   OSA (obstructive sleep apnea) 02/21/2013   Coronary arteriosclerosis 11/18/2011   Dyslipidemia 11/18/2011   Precordial pain 11/17/2011   Abnormal cardiovascular stress test 11/17/2011   Deep venous thrombosis (HCC) 11/01/2011   Hypertensive disorder 11/01/2011   Anemia 11/01/2011   Exertional dyspnea 08/19/2011   Congestive heart failure (HCC) 04/19/2011   Past Medical History:  Diagnosis Date   Anemia    CAD (coronary artery disease)    PCI with stent to RCA in 2019   CHF (congestive heart failure) (HCC) 04/2011   EF 15-20% from echo 05/19/11   DOE (dyspnea on exertion)    DVT (deep venous thrombosis) (HCC) 06/2011   LLE   Fatigue    HTN (hypertension)    Hyperlipidemia    Menorrhagia    with iron  deficient anemia   NSTEMI (non-ST elevated myocardial infarction) (HCC) 10/26/2017   NSTEMI (non-ST elevated myocardial infarction) (HCC) 10/27/2017   Obesity    Orthopnea    Type II diabetes mellitus (HCC)     Family History  Problem Relation Age of Onset   Hypertension Mother    Stroke Father    Heart attack Sister    Stroke Sister     Past Surgical History:  Procedure Laterality Date   CORONARY ANGIOPLASTY WITH STENT PLACEMENT  2013;  10/27/2017   CORONARY BALLOON ANGIOPLASTY N/A 05/26/2022   Procedure: CORONARY BALLOON ANGIOPLASTY;  Surgeon: Elmira Newman PARAS, MD;  Location: [facility];  Service: Cardiovascular;   Laterality: N/A;   CORONARY STENT INTERVENTION N/A 10/27/2017   Procedure: CORONARY STENT INTERVENTION;  Surgeon: Elmira Newman PARAS, MD;  Location: [facility];  Service: Cardiovascular;  Laterality: N/A;   CORONARY STENT INTERVENTION N/A 03/10/2022   Procedure: CORONARY STENT INTERVENTION;  Surgeon: Ladona Heinz, MD;  Location: [facility];  Service: Cardiovascular;  Laterality: N/A;   CORONARY STENT INTERVENTION N/A 03/11/2022   Procedure: CORONARY STENT INTERVENTION;  Surgeon: Elmira Newman PARAS, MD;  Location: [facility];  Service: Cardiovascular;  Laterality: N/A;  aborted   CORONARY STENT INTERVENTION N/A 10/20/2022   Procedure: CORONARY STENT INTERVENTION;  Surgeon: Elmira Newman PARAS, MD;  Location: [facility];  Service: Cardiovascular;  Laterality: N/A;   CORONARY ULTRASOUND/IVUS N/A 05/26/2022   Procedure: Intravascular Ultrasound/IVUS;  Surgeon: Elmira Newman PARAS, MD;  Location: [facility];  Service: Cardiovascular;  Laterality: N/A;   CORONARY ULTRASOUND/IVUS N/A 10/20/2022   Procedure: Intravascular Ultrasound/IVUS;  Surgeon: Elmira Newman PARAS, MD;  Location: [facility];  Service: Cardiovascular;  Laterality: N/A;   LEFT HEART CATH AND CORONARY ANGIOGRAPHY N/A 10/27/2017  Procedure: LEFT HEART CATH AND CORONARY ANGIOGRAPHY;  Surgeon: Elmira Newman PARAS, MD;  Location: [facility];  Service: Cardiovascular;  Laterality: N/A;   LEFT HEART CATH AND CORONARY ANGIOGRAPHY N/A 11/17/2019   Procedure: LEFT HEART CATH AND CORONARY ANGIOGRAPHY;  Surgeon: Elmira Newman PARAS, MD;  Location: [facility];  Service: Cardiovascular;  Laterality: N/A;   LEFT HEART CATH AND CORONARY ANGIOGRAPHY N/A 03/10/2022   Procedure: LEFT HEART CATH AND CORONARY ANGIOGRAPHY;  Surgeon: Ladona Heinz, MD;  Location: [facility];  Service: Cardiovascular;  Laterality: N/A;   LEFT HEART CATH AND CORONARY ANGIOGRAPHY N/A 07/27/2022   Procedure: LEFT  HEART CATH AND CORONARY ANGIOGRAPHY;  Surgeon: Ladona Heinz, MD;  Location: [facility];  Service: Cardiovascular;  Laterality: N/A;   LEFT HEART CATH AND CORONARY ANGIOGRAPHY N/A 10/20/2022   Procedure: LEFT HEART CATH AND CORONARY ANGIOGRAPHY;  Surgeon: Elmira Newman PARAS, MD;  Location: [facility];  Service: Cardiovascular;  Laterality: N/A;   LEFT HEART CATHETERIZATION WITH CORONARY ANGIOGRAM N/A 11/17/2011   Procedure: LEFT HEART CATHETERIZATION WITH CORONARY ANGIOGRAM;  Surgeon: Erick JONELLE Ladona, MD;  Location: [facility];  Service: Cardiovascular;  Laterality: N/A;   OVARIAN CYST REMOVAL     TUBAL LIGATION  2006   ULTRASOUND GUIDANCE FOR VASCULAR ACCESS  10/27/2017   Procedure: Ultrasound Guidance For Vascular Access;  Surgeon: Elmira Newman PARAS, MD;  Location: [facility];  Service: Cardiovascular;;   Social History   Occupational History   Occupation: unemployed  Tobacco Use   Smoking status: Former    Current packs/day: 0.00    Average packs/day: 0.5 packs/day for 10.0 years (5.0 ttl pk-yrs)    Types: Cigarettes    Start date: 10/20/1983    Quit date: 10/19/1993    Years since quitting: 31.0   Smokeless tobacco: Never  Vaping Use   Vaping status: Never Used  Substance and Sexual Activity   Alcohol use: No   Drug use: No   Sexual activity: Not Currently        "

## 2024-11-14 ENCOUNTER — Other Ambulatory Visit: Payer: Self-pay

## 2024-11-14 DIAGNOSIS — M25552 Pain in left hip: Secondary | ICD-10-CM

## 2024-11-14 DIAGNOSIS — G8929 Other chronic pain: Secondary | ICD-10-CM

## 2024-11-15 ENCOUNTER — Other Ambulatory Visit: Payer: Self-pay

## 2024-11-15 DIAGNOSIS — R202 Paresthesia of skin: Secondary | ICD-10-CM

## 2024-11-22 ENCOUNTER — Other Ambulatory Visit: Payer: Self-pay

## 2024-11-22 ENCOUNTER — Inpatient Hospital Stay: Admission: RE | Admit: 2024-11-22 | Discharge: 2024-11-22 | Disposition: A | Source: Ambulatory Visit

## 2024-11-22 ENCOUNTER — Other Ambulatory Visit

## 2024-11-22 DIAGNOSIS — G8929 Other chronic pain: Secondary | ICD-10-CM

## 2024-11-22 DIAGNOSIS — M25552 Pain in left hip: Secondary | ICD-10-CM

## 2025-01-05 ENCOUNTER — Encounter: Payer: Self-pay | Admitting: Neurology

## 2025-02-16 ENCOUNTER — Ambulatory Visit: Admitting: Cardiology
